# Patient Record
Sex: Male | Born: 1950
Health system: Southern US, Community
[De-identification: ages and names within clinical notes are randomized; demographics above are authoritative.]

## PROBLEM LIST (undated history)

## (undated) DIAGNOSIS — R079 Chest pain, unspecified: Secondary | ICD-10-CM

## (undated) DIAGNOSIS — I480 Paroxysmal atrial fibrillation: Secondary | ICD-10-CM

## (undated) DIAGNOSIS — E7800 Pure hypercholesterolemia, unspecified: Secondary | ICD-10-CM

## (undated) DIAGNOSIS — R002 Palpitations: Secondary | ICD-10-CM

## (undated) DIAGNOSIS — J45909 Unspecified asthma, uncomplicated: Secondary | ICD-10-CM

## (undated) DIAGNOSIS — N200 Calculus of kidney: Secondary | ICD-10-CM

## (undated) DIAGNOSIS — E0590 Thyrotoxicosis, unspecified without thyrotoxic crisis or storm: Secondary | ICD-10-CM

## (undated) DIAGNOSIS — I471 Supraventricular tachycardia, unspecified: Secondary | ICD-10-CM

## (undated) DIAGNOSIS — I10 Essential (primary) hypertension: Secondary | ICD-10-CM

## (undated) DIAGNOSIS — I2510 Atherosclerotic heart disease of native coronary artery without angina pectoris: Secondary | ICD-10-CM

## (undated) DIAGNOSIS — Z955 Presence of coronary angioplasty implant and graft: Secondary | ICD-10-CM

## (undated) DIAGNOSIS — K219 Gastro-esophageal reflux disease without esophagitis: Secondary | ICD-10-CM

## (undated) DIAGNOSIS — D649 Anemia, unspecified: Secondary | ICD-10-CM

## (undated) DIAGNOSIS — K859 Acute pancreatitis without necrosis or infection, unspecified: Secondary | ICD-10-CM

## (undated) DIAGNOSIS — C439 Malignant melanoma of skin, unspecified: Secondary | ICD-10-CM

## (undated) DIAGNOSIS — U071 COVID-19: Secondary | ICD-10-CM

## (undated) DIAGNOSIS — K5792 Diverticulitis of intestine, part unspecified, without perforation or abscess without bleeding: Secondary | ICD-10-CM

## (undated) DIAGNOSIS — E119 Type 2 diabetes mellitus without complications: Secondary | ICD-10-CM

## (undated) DIAGNOSIS — M1990 Unspecified osteoarthritis, unspecified site: Secondary | ICD-10-CM

## (undated) DIAGNOSIS — M47816 Spondylosis without myelopathy or radiculopathy, lumbar region: Secondary | ICD-10-CM

## (undated) DIAGNOSIS — Z452 Encounter for adjustment and management of vascular access device: Secondary | ICD-10-CM

## (undated) DIAGNOSIS — Z9981 Dependence on supplemental oxygen: Secondary | ICD-10-CM

## (undated) DIAGNOSIS — J449 Chronic obstructive pulmonary disease, unspecified: Secondary | ICD-10-CM

## (undated) DIAGNOSIS — G4733 Obstructive sleep apnea (adult) (pediatric): Secondary | ICD-10-CM

## (undated) DIAGNOSIS — J189 Pneumonia, unspecified organism: Secondary | ICD-10-CM

## (undated) DIAGNOSIS — Z87442 Personal history of urinary calculi: Secondary | ICD-10-CM

## (undated) DIAGNOSIS — E785 Hyperlipidemia, unspecified: Secondary | ICD-10-CM

## (undated) DIAGNOSIS — K8020 Calculus of gallbladder without cholecystitis without obstruction: Secondary | ICD-10-CM

## (undated) HISTORY — DX: Atherosclerotic heart disease of native coronary artery without angina pectoris: I25.10

## (undated) HISTORY — DX: Malignant melanoma of skin, unspecified: C43.9

## (undated) HISTORY — DX: Supraventricular tachycardia: I47.1

## (undated) HISTORY — DX: Calculus of gallbladder without cholecystitis without obstruction: K80.20

## (undated) HISTORY — DX: Gastro-esophageal reflux disease without esophagitis: K21.9

## (undated) HISTORY — DX: Paroxysmal atrial fibrillation: I48.0

## (undated) HISTORY — PX: TIBIA HARDWARE REMOVAL: SUR1133

## (undated) HISTORY — PX: FRACTURE SURGERY: SHX138

## (undated) HISTORY — DX: Obstructive sleep apnea (adult) (pediatric): G47.33

## (undated) HISTORY — PX: LUMBAR DISC SURGERY: SHX700

## (undated) HISTORY — DX: Calculus of kidney: N20.0

## (undated) HISTORY — PX: PROSTATE SURGERY: SHX751

## (undated) HISTORY — DX: Presence of coronary angioplasty implant and graft: Z95.5

## (undated) HISTORY — PX: CHOLECYSTECTOMY: SHX55

## (undated) HISTORY — PX: CYSTOSCOPY W/ STONE MANIPULATION: SHX1427

## (undated) HISTORY — PX: OTHER SURGICAL HISTORY: SHX169

## (undated) HISTORY — DX: COVID-19: U07.1

## (undated) HISTORY — DX: Pure hypercholesterolemia, unspecified: E78.00

## (undated) HISTORY — DX: Essential (primary) hypertension: I10

## (undated) HISTORY — DX: Hyperlipidemia, unspecified: E78.5

## (undated) HISTORY — DX: Supraventricular tachycardia, unspecified: I47.10

---
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
## 1988-01-26 HISTORY — PX: TIBIA FRACTURE SURGERY: SHX806

## 1997-10-14 ENCOUNTER — Inpatient Hospital Stay (HOSPITAL_COMMUNITY): Admission: EM | Admit: 1997-10-14 | Discharge: 1997-10-16 | Payer: Self-pay | Admitting: Cardiology

## 1998-10-09 ENCOUNTER — Ambulatory Visit (HOSPITAL_COMMUNITY): Admission: RE | Admit: 1998-10-09 | Discharge: 1998-10-09 | Payer: Self-pay | Admitting: Family Medicine

## 1998-10-09 ENCOUNTER — Encounter: Payer: Self-pay | Admitting: Family Medicine

## 1998-11-25 ENCOUNTER — Encounter (INDEPENDENT_AMBULATORY_CARE_PROVIDER_SITE_OTHER): Payer: Self-pay

## 1998-11-25 ENCOUNTER — Encounter: Payer: Self-pay | Admitting: Specialist

## 1998-11-25 ENCOUNTER — Observation Stay (HOSPITAL_COMMUNITY): Admission: RE | Admit: 1998-11-25 | Discharge: 1998-11-26 | Payer: Self-pay | Admitting: Specialist

## 2000-04-15 ENCOUNTER — Encounter (INDEPENDENT_AMBULATORY_CARE_PROVIDER_SITE_OTHER): Payer: Self-pay | Admitting: Specialist

## 2000-04-15 ENCOUNTER — Observation Stay (HOSPITAL_COMMUNITY): Admission: RE | Admit: 2000-04-15 | Discharge: 2000-04-16 | Payer: Self-pay | Admitting: Specialist

## 2000-04-15 ENCOUNTER — Encounter: Payer: Self-pay | Admitting: Specialist

## 2002-01-25 HISTORY — PX: POSTERIOR LUMBAR FUSION: SHX6036

## 2002-09-27 ENCOUNTER — Encounter: Payer: Self-pay | Admitting: Specialist

## 2002-10-03 ENCOUNTER — Inpatient Hospital Stay (HOSPITAL_COMMUNITY): Admission: RE | Admit: 2002-10-03 | Discharge: 2002-10-08 | Payer: Self-pay | Admitting: Specialist

## 2002-10-03 ENCOUNTER — Encounter: Payer: Self-pay | Admitting: Specialist

## 2003-05-17 ENCOUNTER — Encounter: Admission: RE | Admit: 2003-05-17 | Discharge: 2003-05-17 | Payer: Self-pay | Admitting: Specialist

## 2004-08-30 HISTORY — PX: CORONARY ANGIOPLASTY WITH STENT PLACEMENT: SHX49

## 2004-08-31 ENCOUNTER — Ambulatory Visit: Payer: Self-pay | Admitting: Cardiology

## 2004-08-31 ENCOUNTER — Inpatient Hospital Stay (HOSPITAL_COMMUNITY): Admission: AD | Admit: 2004-08-31 | Discharge: 2004-09-01 | Payer: Self-pay | Admitting: Cardiology

## 2005-01-21 ENCOUNTER — Inpatient Hospital Stay (HOSPITAL_COMMUNITY): Admission: EM | Admit: 2005-01-21 | Discharge: 2005-01-23 | Payer: Self-pay | Admitting: Emergency Medicine

## 2005-01-21 ENCOUNTER — Ambulatory Visit: Payer: Self-pay | Admitting: Internal Medicine

## 2005-01-25 HISTORY — PX: ATRIAL ABLATION SURGERY: SHX560

## 2005-01-27 ENCOUNTER — Ambulatory Visit (HOSPITAL_COMMUNITY): Admission: RE | Admit: 2005-01-27 | Discharge: 2005-01-28 | Payer: Self-pay | Admitting: Internal Medicine

## 2005-01-27 ENCOUNTER — Ambulatory Visit: Payer: Self-pay | Admitting: Internal Medicine

## 2005-03-10 ENCOUNTER — Ambulatory Visit: Payer: Self-pay | Admitting: Internal Medicine

## 2005-05-26 ENCOUNTER — Encounter: Payer: Self-pay | Admitting: Cardiology

## 2006-01-25 HISTORY — PX: OTHER SURGICAL HISTORY: SHX169

## 2006-10-09 ENCOUNTER — Encounter: Payer: Self-pay | Admitting: Internal Medicine

## 2006-11-24 ENCOUNTER — Encounter: Payer: Self-pay | Admitting: Pulmonary Disease

## 2006-12-17 ENCOUNTER — Encounter: Payer: Self-pay | Admitting: Pulmonary Disease

## 2007-01-09 ENCOUNTER — Encounter: Payer: Self-pay | Admitting: Pulmonary Disease

## 2007-12-25 ENCOUNTER — Encounter: Payer: Self-pay | Admitting: Physician Assistant

## 2007-12-25 HISTORY — PX: COLONOSCOPY: SHX174

## 2008-08-26 ENCOUNTER — Encounter: Payer: Self-pay | Admitting: Pulmonary Disease

## 2008-09-13 ENCOUNTER — Encounter: Payer: Self-pay | Admitting: Pulmonary Disease

## 2008-09-23 ENCOUNTER — Ambulatory Visit: Payer: Self-pay | Admitting: Cardiovascular Disease

## 2008-09-25 ENCOUNTER — Telehealth (INDEPENDENT_AMBULATORY_CARE_PROVIDER_SITE_OTHER): Payer: Self-pay | Admitting: *Deleted

## 2008-09-25 ENCOUNTER — Telehealth: Payer: Self-pay | Admitting: Cardiovascular Disease

## 2008-09-26 ENCOUNTER — Ambulatory Visit: Payer: Self-pay | Admitting: Cardiovascular Disease

## 2008-09-26 ENCOUNTER — Telehealth: Payer: Self-pay | Admitting: Cardiovascular Disease

## 2008-10-10 ENCOUNTER — Telehealth: Payer: Self-pay | Admitting: Cardiovascular Disease

## 2008-10-15 ENCOUNTER — Ambulatory Visit: Payer: Self-pay | Admitting: Internal Medicine

## 2008-10-15 DIAGNOSIS — R002 Palpitations: Secondary | ICD-10-CM

## 2008-10-15 DIAGNOSIS — E781 Pure hyperglyceridemia: Secondary | ICD-10-CM | POA: Insufficient documentation

## 2008-10-15 DIAGNOSIS — E785 Hyperlipidemia, unspecified: Secondary | ICD-10-CM

## 2008-10-15 DIAGNOSIS — I200 Unstable angina: Secondary | ICD-10-CM

## 2008-10-15 DIAGNOSIS — K219 Gastro-esophageal reflux disease without esophagitis: Secondary | ICD-10-CM

## 2008-10-15 DIAGNOSIS — N200 Calculus of kidney: Secondary | ICD-10-CM

## 2008-10-15 DIAGNOSIS — I10 Essential (primary) hypertension: Secondary | ICD-10-CM | POA: Insufficient documentation

## 2008-10-15 HISTORY — DX: Hyperlipidemia, unspecified: E78.5

## 2008-10-15 HISTORY — DX: Essential (primary) hypertension: I10

## 2008-10-15 HISTORY — DX: Calculus of kidney: N20.0

## 2008-10-15 HISTORY — DX: Pure hyperglyceridemia: E78.1

## 2008-10-15 HISTORY — DX: Unstable angina: I20.0

## 2008-10-15 HISTORY — DX: Gastro-esophageal reflux disease without esophagitis: K21.9

## 2008-10-15 LAB — CONVERTED CEMR LAB
ALT: 36 units/L (ref 0–53)
Albumin: 3.7 g/dL (ref 3.5–5.2)
TSH: 0.63 microintl units/mL (ref 0.35–5.50)
Total Protein: 6.7 g/dL (ref 6.0–8.3)

## 2008-10-16 ENCOUNTER — Telehealth (INDEPENDENT_AMBULATORY_CARE_PROVIDER_SITE_OTHER): Payer: Self-pay | Admitting: *Deleted

## 2008-11-21 ENCOUNTER — Ambulatory Visit: Payer: Self-pay | Admitting: Internal Medicine

## 2008-11-21 DIAGNOSIS — I4891 Unspecified atrial fibrillation: Secondary | ICD-10-CM

## 2008-11-21 DIAGNOSIS — R079 Chest pain, unspecified: Secondary | ICD-10-CM

## 2008-11-21 DIAGNOSIS — I4821 Permanent atrial fibrillation: Secondary | ICD-10-CM | POA: Insufficient documentation

## 2008-11-21 DIAGNOSIS — I4819 Other persistent atrial fibrillation: Secondary | ICD-10-CM | POA: Insufficient documentation

## 2008-11-21 DIAGNOSIS — I480 Paroxysmal atrial fibrillation: Secondary | ICD-10-CM

## 2008-11-21 HISTORY — DX: Paroxysmal atrial fibrillation: I48.0

## 2008-11-21 HISTORY — DX: Unspecified atrial fibrillation: I48.91

## 2008-11-21 HISTORY — DX: Chest pain, unspecified: R07.9

## 2008-12-09 ENCOUNTER — Ambulatory Visit: Payer: Self-pay

## 2008-12-09 ENCOUNTER — Ambulatory Visit: Payer: Self-pay | Admitting: Internal Medicine

## 2008-12-09 ENCOUNTER — Encounter: Payer: Self-pay | Admitting: Internal Medicine

## 2008-12-09 ENCOUNTER — Ambulatory Visit (HOSPITAL_COMMUNITY): Admission: RE | Admit: 2008-12-09 | Discharge: 2008-12-09 | Payer: Self-pay | Admitting: Internal Medicine

## 2008-12-25 ENCOUNTER — Encounter: Payer: Self-pay | Admitting: Internal Medicine

## 2009-01-20 ENCOUNTER — Encounter: Payer: Self-pay | Admitting: Internal Medicine

## 2009-01-27 ENCOUNTER — Encounter: Payer: Self-pay | Admitting: Internal Medicine

## 2009-02-06 ENCOUNTER — Encounter: Payer: Self-pay | Admitting: Internal Medicine

## 2009-02-11 ENCOUNTER — Encounter: Payer: Self-pay | Admitting: Internal Medicine

## 2009-02-12 ENCOUNTER — Encounter: Payer: Self-pay | Admitting: Internal Medicine

## 2009-02-18 ENCOUNTER — Encounter: Payer: Self-pay | Admitting: Internal Medicine

## 2009-02-19 ENCOUNTER — Ambulatory Visit: Payer: Self-pay | Admitting: Internal Medicine

## 2009-02-19 LAB — CONVERTED CEMR LAB
BUN: 10 mg/dL (ref 6–23)
Basophils Absolute: 0 10*3/uL (ref 0.0–0.1)
CO2: 27 meq/L (ref 19–32)
Chloride: 107 meq/L (ref 96–112)
Creatinine, Ser: 1.1 mg/dL (ref 0.4–1.5)
GFR calc non Af Amer: 73.18 mL/min (ref >60)
Glucose, Bld: 120 mg/dL — ABNORMAL HIGH (ref 70–99)
Lymphocytes Relative: 22.2 % (ref 12.0–46.0)
MCHC: 32.6 g/dL (ref 30.0–36.0)
Monocytes Relative: 4.5 % (ref 3.0–12.0)
Neutro Abs: 3.6 10*3/uL (ref 1.4–7.7)
Neutrophils Relative %: 69.9 % (ref 43.0–77.0)
Platelets: 130 10*3/uL — ABNORMAL LOW (ref 150.0–400.0)
Potassium: 3.9 meq/L (ref 3.5–5.1)
RBC: 5.27 M/uL (ref 4.22–5.81)
RDW: 14.4 % (ref 11.5–14.6)
Sodium: 140 meq/L (ref 135–145)
aPTT: 40.1 s — ABNORMAL HIGH (ref 21.7–28.8)

## 2009-02-24 ENCOUNTER — Encounter: Payer: Self-pay | Admitting: Internal Medicine

## 2009-02-24 ENCOUNTER — Ambulatory Visit: Payer: Self-pay | Admitting: Internal Medicine

## 2009-02-24 ENCOUNTER — Ambulatory Visit (HOSPITAL_COMMUNITY): Admission: RE | Admit: 2009-02-24 | Discharge: 2009-02-24 | Payer: Self-pay | Admitting: Internal Medicine

## 2009-02-25 ENCOUNTER — Inpatient Hospital Stay (HOSPITAL_COMMUNITY)
Admission: RE | Admit: 2009-02-25 | Discharge: 2009-02-26 | Payer: Self-pay | Source: Home / Self Care | Admitting: Internal Medicine

## 2009-02-25 ENCOUNTER — Ambulatory Visit: Payer: Self-pay | Admitting: Internal Medicine

## 2009-02-26 ENCOUNTER — Encounter: Payer: Self-pay | Admitting: Internal Medicine

## 2009-03-04 ENCOUNTER — Encounter: Payer: Self-pay | Admitting: Internal Medicine

## 2009-03-06 ENCOUNTER — Telehealth: Payer: Self-pay | Admitting: Internal Medicine

## 2009-03-11 ENCOUNTER — Encounter: Payer: Self-pay | Admitting: Internal Medicine

## 2009-03-19 ENCOUNTER — Ambulatory Visit: Payer: Self-pay | Admitting: Internal Medicine

## 2009-03-19 DIAGNOSIS — R0602 Shortness of breath: Secondary | ICD-10-CM

## 2009-03-19 HISTORY — DX: Shortness of breath: R06.02

## 2009-03-27 ENCOUNTER — Telehealth: Payer: Self-pay | Admitting: Internal Medicine

## 2009-04-25 ENCOUNTER — Telehealth: Payer: Self-pay | Admitting: Internal Medicine

## 2009-05-15 ENCOUNTER — Ambulatory Visit: Payer: Self-pay | Admitting: Pulmonary Disease

## 2009-05-15 DIAGNOSIS — J449 Chronic obstructive pulmonary disease, unspecified: Secondary | ICD-10-CM

## 2009-05-15 HISTORY — DX: Chronic obstructive pulmonary disease, unspecified: J44.9

## 2009-05-17 DIAGNOSIS — G4730 Sleep apnea, unspecified: Secondary | ICD-10-CM | POA: Insufficient documentation

## 2009-05-17 HISTORY — DX: Sleep apnea, unspecified: G47.30

## 2009-06-16 ENCOUNTER — Ambulatory Visit: Payer: Self-pay | Admitting: Internal Medicine

## 2009-06-16 LAB — CONVERTED CEMR LAB
CO2: 28 meq/L (ref 19–32)
Chloride: 105 meq/L (ref 96–112)
GFR calc non Af Amer: 65.49 mL/min (ref >60)
Potassium: 4.2 meq/L (ref 3.5–5.1)
Sodium: 142 meq/L (ref 135–145)

## 2009-06-24 ENCOUNTER — Telehealth: Payer: Self-pay | Admitting: Internal Medicine

## 2009-06-25 ENCOUNTER — Inpatient Hospital Stay (HOSPITAL_COMMUNITY): Admission: EM | Admit: 2009-06-25 | Discharge: 2009-06-28 | Payer: Self-pay | Admitting: Internal Medicine

## 2009-06-25 ENCOUNTER — Ambulatory Visit: Payer: Self-pay | Admitting: Internal Medicine

## 2009-06-25 ENCOUNTER — Encounter: Payer: Self-pay | Admitting: Internal Medicine

## 2009-06-30 ENCOUNTER — Telehealth: Payer: Self-pay | Admitting: Internal Medicine

## 2009-07-02 ENCOUNTER — Telehealth: Payer: Self-pay | Admitting: Internal Medicine

## 2009-07-03 ENCOUNTER — Encounter: Payer: Self-pay | Admitting: Pulmonary Disease

## 2009-07-14 ENCOUNTER — Ambulatory Visit: Payer: Self-pay | Admitting: Pulmonary Disease

## 2009-07-21 ENCOUNTER — Telehealth (INDEPENDENT_AMBULATORY_CARE_PROVIDER_SITE_OTHER): Payer: Self-pay | Admitting: *Deleted

## 2009-08-07 ENCOUNTER — Ambulatory Visit: Payer: Self-pay | Admitting: Internal Medicine

## 2009-08-25 ENCOUNTER — Encounter: Payer: Self-pay | Admitting: Internal Medicine

## 2009-08-25 ENCOUNTER — Telehealth: Payer: Self-pay | Admitting: Internal Medicine

## 2009-08-29 ENCOUNTER — Telehealth: Payer: Self-pay | Admitting: Internal Medicine

## 2009-09-08 ENCOUNTER — Encounter: Payer: Self-pay | Admitting: Internal Medicine

## 2009-09-15 ENCOUNTER — Encounter: Payer: Self-pay | Admitting: Internal Medicine

## 2009-09-16 ENCOUNTER — Encounter: Payer: Self-pay | Admitting: Internal Medicine

## 2009-09-17 ENCOUNTER — Ambulatory Visit: Payer: Self-pay | Admitting: Cardiology

## 2009-09-17 ENCOUNTER — Ambulatory Visit (HOSPITAL_COMMUNITY): Admission: RE | Admit: 2009-09-17 | Discharge: 2009-09-17 | Payer: Self-pay | Admitting: Internal Medicine

## 2009-09-17 ENCOUNTER — Ambulatory Visit: Payer: Self-pay | Admitting: Internal Medicine

## 2009-09-18 LAB — CONVERTED CEMR LAB
BUN: 15 mg/dL (ref 6–23)
Basophils Absolute: 0 10*3/uL (ref 0.0–0.1)
CO2: 28 meq/L (ref 19–32)
Calcium: 9.4 mg/dL (ref 8.4–10.5)
Chloride: 103 meq/L (ref 96–112)
Eosinophils Absolute: 0.1 10*3/uL (ref 0.0–0.7)
Eosinophils Relative: 2.7 % (ref 0.0–5.0)
Glucose, Bld: 89 mg/dL (ref 70–99)
HCT: 40.1 % (ref 39.0–52.0)
Lymphocytes Relative: 22.4 % (ref 12.0–46.0)
MCV: 83 fL (ref 78.0–100.0)
Neutrophils Relative %: 68.3 % (ref 43.0–77.0)
Platelets: 147 10*3/uL — ABNORMAL LOW (ref 150.0–400.0)
Prothrombin Time: 20.8 s — ABNORMAL HIGH (ref 9.7–11.8)
RBC: 4.83 M/uL (ref 4.22–5.81)
RDW: 15.3 % — ABNORMAL HIGH (ref 11.5–14.6)
aPTT: 35.7 s — ABNORMAL HIGH (ref 21.7–28.8)

## 2009-09-22 ENCOUNTER — Encounter: Payer: Self-pay | Admitting: Internal Medicine

## 2009-09-24 ENCOUNTER — Ambulatory Visit: Payer: Self-pay | Admitting: Cardiovascular Disease

## 2009-09-24 ENCOUNTER — Ambulatory Visit (HOSPITAL_COMMUNITY): Admission: RE | Admit: 2009-09-24 | Discharge: 2009-09-24 | Payer: Self-pay | Admitting: Cardiovascular Disease

## 2009-09-24 ENCOUNTER — Encounter: Payer: Self-pay | Admitting: Internal Medicine

## 2009-09-25 ENCOUNTER — Ambulatory Visit: Payer: Self-pay | Admitting: Internal Medicine

## 2009-09-25 ENCOUNTER — Ambulatory Visit (HOSPITAL_COMMUNITY): Admission: RE | Admit: 2009-09-25 | Discharge: 2009-09-26 | Payer: Self-pay | Admitting: Internal Medicine

## 2009-09-30 ENCOUNTER — Ambulatory Visit: Payer: Self-pay | Admitting: Cardiovascular Disease

## 2009-09-30 ENCOUNTER — Encounter: Payer: Self-pay | Admitting: Internal Medicine

## 2009-09-30 ENCOUNTER — Ambulatory Visit: Payer: Self-pay

## 2009-10-01 ENCOUNTER — Encounter: Payer: Self-pay | Admitting: Internal Medicine

## 2009-10-06 ENCOUNTER — Telehealth: Payer: Self-pay | Admitting: Internal Medicine

## 2009-11-13 ENCOUNTER — Encounter: Payer: Self-pay | Admitting: Pulmonary Disease

## 2009-11-26 ENCOUNTER — Encounter: Payer: Self-pay | Admitting: Pulmonary Disease

## 2009-11-27 ENCOUNTER — Encounter: Payer: Self-pay | Admitting: Pulmonary Disease

## 2009-11-27 ENCOUNTER — Telehealth (INDEPENDENT_AMBULATORY_CARE_PROVIDER_SITE_OTHER): Payer: Self-pay | Admitting: *Deleted

## 2009-12-31 ENCOUNTER — Ambulatory Visit: Payer: Self-pay | Admitting: Internal Medicine

## 2009-12-31 ENCOUNTER — Encounter: Payer: Self-pay | Admitting: Internal Medicine

## 2009-12-31 DIAGNOSIS — I2510 Atherosclerotic heart disease of native coronary artery without angina pectoris: Secondary | ICD-10-CM | POA: Insufficient documentation

## 2010-01-05 ENCOUNTER — Telehealth: Payer: Self-pay | Admitting: Internal Medicine

## 2010-01-07 ENCOUNTER — Telehealth: Payer: Self-pay | Admitting: Internal Medicine

## 2010-02-15 ENCOUNTER — Encounter: Payer: Self-pay | Admitting: Internal Medicine

## 2010-02-26 ENCOUNTER — Encounter: Payer: Self-pay | Admitting: Internal Medicine

## 2010-02-26 NOTE — Miscellaneous (Signed)
Summary: Living Will  Living Will   Imported By: Sallee Provencal 02/04/2009 11:44:13  _____________________________________________________________________  External Attachment:    Type:   Image     Comment:   External Document

## 2010-02-26 NOTE — Miscellaneous (Signed)
Summary: [place]   Imported By: Marilynne Drivers 03/25/2009 12:53:11  _____________________________________________________________________  External Attachment:    Type:   Image     Comment:   External Document

## 2010-02-26 NOTE — Letter (Signed)
Summary: ELectrophysiology/Ablation Procedure Instructions  [name] [place]  [address] [place]   [place], [place] [address]   Phone: [phone]  Fax: [phone]     Electrophysiology/Ablation Procedure Instructions    You are scheduled for a(n) a-fib ablation on 09/25/09 at 7:30am with Dr. Rayann Heman.  1.  Please come to the [place] at [facility] at 5:30am on the day of your procedure.  2.  Come prepared to stay overnight.   Please bring your insurance cards and a list of your medications.  3.  Come go to Dr. Willette Pa office on 09/18/09 for lab work pre ablation.    You do not have to be fasting.(pt has order)fax results to [phone]  4.  Do not have anything to eat or drink after midnight the night before your procedure.  5.  Do NOT take these medications for the pm before and the am of  procedure unless otherwise instructed:  Metoprolol.  All of your remaining medications may be taken with a small amount of water.     * Occasionally, EP studies and ablations can become lengthy.  Please make your family aware of this before your procedure starts.  Average time ranges from 2-8 hours for EP studies/ablations.  Your physician will locate your family after the procedure with the results.  * If you have any questions after you get home, please call the office at [phone]. [name]  TEE--09/24/09 at              .  please check in at Short Stay at [facility] at          .  Nothing to eat or drink after midnight the night before.    Cardiac CT the week of 8/15 or 8/22( will need labs- a BMP one week prior to CT)  Weekly INR's starting week of 08/25/09-fax results to [phone]

## 2010-02-26 NOTE — Progress Notes (Signed)
Summary: PT REQ CALL BACK TO PHARMACY  Phone Note Call from Patient Call back at Home Phone [phone]   Caller: Patient Reason for Call: Talk to Nurse Summary of Call: [place] 60-PT [place] Initial call taken by: Lorenda Hatchet,  July 02, 2009 9:25 AM  Follow-up for Phone Call        spoke with Truman Hayward at [facility] she can't see that anything was done on 06/30/09 with his Rx and giving the quanity of 60 not 30 as it was written by Hershal Coria.  He only was charged a 15 day co payment but the 15 day and 30 day are the same  He should not be charged and says he spoke with pharmacist and siad as long as we called and corrected he would not be.  left message for pt regarding above. Janan Halter, RN, BSN  July 02, 2009 9:59 AM

## 2010-02-26 NOTE — Miscellaneous (Signed)
  Clinical Lists Changes  Observations: Added new observation of ECHOINTERP:  Left ventricle: The cavity size was normal. Wall thickness was     normal. Systolic function was normal. The estimated ejection     fraction was in the range of 55% to 60%. Doppler parameters are     consistent with abnormal left ventricular relaxation (grade 1     diastolic dysfunction).        (12/09/2008 9:54)      Echocardiogram  Procedure date:  12/09/2008  Findings:       Left ventricle: The cavity size was normal. Wall thickness was     normal. Systolic function was normal. The estimated ejection     fraction was in the range of 55% to 60%. Doppler parameters are     consistent with abnormal left ventricular relaxation (grade 1     diastolic dysfunction).

## 2010-02-26 NOTE — Progress Notes (Signed)
Summary: knot at incision site  Phone Note Call from Patient   Caller: Patient Reason for Call: Talk to Nurse Summary of Call: pt has golf ball sized knot at incision site -pls [phone] Initial call taken by: Lorenda Hatchet,  October 06, 2009 1:44 PM  Follow-up for Phone Call        spoke with Dr Rayann Heman  He states we have done CT and [place] of his grion which were both negative.  Spoke with pt he is going to use warm compresses several times daily and call me back towardsthe end of week.  He is feeling musch better just has the knot in groin.  It does not hurt him  he just wanted [place] to know it was there.  I let him know this would go down over time. Janan Halter, RN, BSN  October 06, 2009 3:29 PM Called back today to see if his groin was any better.  LMOM for him to call me back if things are not improving. Janan Halter, RN, BSN  October 08, 2009 3:22 PM

## 2010-02-26 NOTE — Assessment & Plan Note (Signed)
Summary: eph/post ablation per amber/lg   Visit Type:  EPH Referring Provider:  Winn Jock Primary Provider:  [name], MD  CC:   .  History of Present Illness: The patient presents today for routine electrophysiology followup. He reports doing very well since his most recent afib ablation.   He is pleased with his result.  He denies procedure related complications and has had no further symptoms of afib.  The patient denies symptoms of palpitations, chest pain, shortness of breath, orthopnea, PND, lower extremity edema, dizziness, presyncope, syncope, or neurologic sequela. The patient is tolerating medications without difficulties and is otherwise without complaint today.   Current Medications (verified): 1)  Clonidine Hcl 0.2 Mg Tabs (Clonidine Hcl) .... Take One Tablet By Mouth Two Times A Day 2)  Protonix 40 Mg Tbec (Pantoprazole Sodium) .Marland Kitchen.. 1 Tab Once Daily Sometimes Pt Takes 2 Tabs 3)  Lisinopril 40 Mg Tabs (Lisinopril) .... Take One Tablet By Mouth Daily 4)  Lipitor 40 Mg Tabs (Atorvastatin Calcium) .... Take One Tablet By Mouth Daily. 5)  Fenofibrate 160 Mg Tabs (Fenofibrate) .... Take One Tablet By Mouth Daily With A Meal 6)  Tamsulosin Hcl 0.4 Mg Caps (Tamsulosin Hcl) .Marland Kitchen.. 1 By Mouth Once Daily 7)  Warfarin Sodium 7.5 Mg Tabs (Warfarin Sodium) .... Use As Directed By Anticoagulation Clinic 8)  Stool Softener 100 Mg Caps (Docusate Sodium) .Marland Kitchen.. 1 By Mouth As Needed 9)  Hydrocodone-Acetaminophen 5-500 Mg Tabs (Hydrocodone-Acetaminophen) .Marland Kitchen.. 1 or 2 Every 6 As Needed 10)  Combivent 103-18 Mcg/act Aero (Ipratropium-Albuterol) .... As Needed 11)  Nitrostat 0.4 Mg Subl (Nitroglycerin) .Marland Kitchen.. 1 Tablet Under Tongue At Onset of Chest Pain; You May Repeat Every 5 Minutes For Up To 3 Doses. 12)  Aspirin 81 Mg Tbec (Aspirin) .... Take One Tablet By Mouth Daily 13)  Cpap 12 Cm .... Oval Lin[facility] [phone] 14)  Amiodarone Hcl 200 Mg Tabs (Amiodarone Hcl) .... Take 1 Tablet  By Mouth Once A Day 15)  Metoprolol Tartrate 50 Mg Tabs (Metoprolol Tartrate) .... Take 1 Tablet By Mouth Two Times A Day 16)  Amlodipine Besylate 5 Mg Tabs (Amlodipine Besylate) .... Take One Tablet By Mouth Daily  Allergies: 1)  ! Penicillin  Past History:  Past Medical History: Paroxysmal afib  s/p PVI 2/11 and 9/11 Atrial flutter s/p atypical AVNRT ablation of the slow pathway GERD (ICD-530.81) NEPHROLITHIASIS (ICD-592.0) DYSLIPIDEMIA (ICD-272.4) HYPERTENSION (ICD-401.9) CAD s/p PCI mid LAD (Vision stent)  08/31/2004,  repeat cath 6/11- no progression of CAD OSA noncompliant with CPAP  Past Surgical History:  History of cholecystectomy.  History of tonsillectomy.  Degenerative joint disease status post multiple diskectomies of the lumbar spine  s/p slow pathway ablation for atypical AVNRT 2007  negative EP study in [facility] 2008  PCI mid LAD (vision) 2006 s/p PVI/ CTI ablation 02/25/09 and 9/11  Social History: Reviewed history from 05/15/2009 and no changes required. Resides in [place] with his wife. He has two children and  two grandchildren, He attends [employer].  Prior to his  retirement, he was a [employer] Administrator. He has not smoked since March 2006;  prior to this, he smoked three packs per day for 38  years. He denies any  alcohol, drugs, herbal medications.  Review of Systems       All systems are reviewed and negative except as listed in the HPI.   Vital Signs:  Patient profile:   60 year old male Height:  72 inches Weight:      291.75 pounds BMI:     39.71 Pulse rate:   66 / minute Pulse rhythm:   irregular BP sitting:   122 / 70  (right arm) Cuff size:   large  Vitals Entered By: Julaine Hua, CMA (December 31, 2009 9:31 AM)  Physical Exam  General:  overweight Head:  normocephalic and atraumatic Eyes:  PERRLA/EOM intact; conjunctiva and lids normal. Mouth:  Teeth, gums and palate normal. Oral mucosa normal. Neck:  Neck  supple, no JVD. No masses, thyromegaly or abnormal cervical nodes. Lungs:  Clear bilaterally to auscultation and percussion. Heart:  RRR, no m/r/g Abdomen:  Bowel sounds positive; abdomen soft and non-tender without masses, organomegaly, or hernias noted. No hepatosplenomegaly. Msk:  Back normal, normal gait. Muscle strength and tone normal. Extremities:  No clubbing or cyanosis. Neurologic:  Alert and oriented x 3.   EKG  Procedure date:  12/31/2009  Findings:      sinus rhythm 66 bpm, PR 166, Qtc 454, LAD otherwise normal ekg  Impression & Recommendations:  Problem # 1:  ATRIAL FIBRILLATION (ICD-427.31)  maintaining sinus rhythm s/p repeat afib ablation 9/11 we will continue coumadin longterm decrease amiodarone to 164m daily today  The following medications were removed from the medication list:    Aspirin 81 Mg Tbec (Aspirin) ..Marland Kitchen.. Take one tablet by mouth daily His updated medication list for this problem includes:    Warfarin Sodium 7.5 Mg Tabs (Warfarin sodium) ..... Use as directed by anticoagulation clinic    Amiodarone Hcl 200 Mg Tabs (Amiodarone hcl) ..Marland Kitchen.. Take 1/2  tablet by mouth once a day    Metoprolol Tartrate 50 Mg Tabs (Metoprolol tartrate) ..Marland Kitchen.. Take 1 tablet by mouth two times a day  Problem # 2:  OBSTRUCTIVE SLEEP APNEA (ICD-780.57) CPAP complaince and weight loss advised  Problem # 3:  SHORTNESS OF BREATH (ICD-786.05)  resolved in sinus rhythm  The following medications were removed from the medication list:    Aspirin 81 Mg Tbec (Aspirin) ..Marland Kitchen.. Take one tablet by mouth daily His updated medication list for this problem includes:    Lisinopril 40 Mg Tabs (Lisinopril) ..Marland Kitchen.. Take one tablet by mouth daily    Metoprolol Tartrate 50 Mg Tabs (Metoprolol tartrate) ..Marland Kitchen.. Take 1 tablet by mouth two times a day    Amlodipine Besylate 5 Mg Tabs (Amlodipine besylate) ..Marland Kitchen.. Take one tablet by mouth daily  Problem # 4:  HYPERTENSION (ICD-401.9)  at  goal  The following medications were removed from the medication list:    Aspirin 81 Mg Tbec (Aspirin) ..Marland Kitchen.. Take one tablet by mouth daily His updated medication list for this problem includes:    Clonidine Hcl 0.2 Mg Tabs (Clonidine hcl) ..Marland Kitchen.. Take one tablet by mouth two times a day    Lisinopril 40 Mg Tabs (Lisinopril) ..Marland Kitchen.. Take one tablet by mouth daily    Metoprolol Tartrate 50 Mg Tabs (Metoprolol tartrate) ..Marland Kitchen.. Take 1 tablet by mouth two times a day    Amlodipine Besylate 5 Mg Tabs (Amlodipine besylate) ..Marland Kitchen.. Take one tablet by mouth daily  Problem # 5:  CAD (ICD-414.00) no sypmtoms of ischemia given coumadin therapy and increased risks for bleeding, we will stop ASA today  Patient Instructions: 1)  Your physician has recommended you make the following change in your medication: Stop Aspirin, Decrease Amiodarone to 1086mdaily.  2)  Your physician wants you to follow-up in: 6 months  You will receive a reminder letter  in the mail two months in advance. If you don't receive a letter, please call our office to schedule the follow-up appointment. 3)  Your physician encouraged you to lose weight for better health.

## 2010-02-26 NOTE — Progress Notes (Signed)
Summary: physicians order status  Phone Note Other Incoming Call back at [phone]   Caller: [email or web address] Summary of Call: Following up on physician's order that was to be faxed yesterday. The fax number is  [phone]. Initial call taken by: Netta Neat,  November 27, 2009 11:32 AM  Follow-up for Phone Call        Janett Billow, do you know what the status of form is? Pls advise thanks! Tilden Dome  November 27, 2009 11:40 AM  Per Dr. Elsworth Soho he signed and gave to [place]  November 27, 2009 4:41 PM   Alida, do you know if this has been faxed back yet?  thanks! Parke Poisson CNA/MA  November 27, 2009 4:46 PM   Additional Follow-up for Phone Call Additional follow up Details #1::        Spoke with Wells Guiles at [facility], form was faxed and was received, order is being shipped to patient. Verdie Mosher CMA  November 28, 2009 9:11 AM  Additional Follow-up by: Verdie Mosher CMA,  November 28, 2009 9:11 AM

## 2010-02-26 NOTE — Assessment & Plan Note (Signed)
Summary: eph/post cath/jml   Visit Type:  Follow-up Referring Provider:  Winn Jock Primary Provider:  [name], MD   History of Present Illness: The patient presents today for routine electrophysiology followup. He reports doing well since last being seen in our clinic.  He continues to have fatigue and SOB with afib.  His afib has returned despite medical therapy with amiodarone.  The patient denies symptoms of  chest pain, orthopnea, PND, lower extremity edema, dizziness, presyncope, syncope, or neurologic sequela.   His leg cramps have resolved.  The patient is tolerating medications without difficulties and is otherwise without complaint today.   Current Medications (verified): 1)  Clonidine Hcl 0.2 Mg Tabs (Clonidine Hcl) .... Take One Tablet By Mouth Two Times A Day 2)  Protonix 40 Mg Tbec (Pantoprazole Sodium) .Marland Kitchen.. 1 By Mouth Two Times A Day 3)  Lisinopril 40 Mg Tabs (Lisinopril) .... Take One Tablet By Mouth Daily 4)  Lipitor 40 Mg Tabs (Atorvastatin Calcium) .... Take One Tablet By Mouth Daily. 5)  Fenofibrate 160 Mg Tabs (Fenofibrate) .... Take One Tablet By Mouth Daily With A Meal 6)  Tamsulosin Hcl 0.4 Mg Caps (Tamsulosin Hcl) .Marland Kitchen.. 1 By Mouth Once Daily 7)  Warfarin Sodium 7.5 Mg Tabs (Warfarin Sodium) .... Use As Directed By Anticoagulation Clinic 8)  Stool Softener 100 Mg Caps (Docusate Sodium) .Marland Kitchen.. 1 By Mouth As Needed 9)  Hydrocodone-Acetaminophen 5-500 Mg Tabs (Hydrocodone-Acetaminophen) .Marland Kitchen.. 1 or 2 Every 6 As Needed 10)  Combivent 103-18 Mcg/act Aero (Ipratropium-Albuterol) .... As Needed 11)  Nitrostat 0.4 Mg Subl (Nitroglycerin) .... As Directed 12)  Aspirin 81 Mg Tbec (Aspirin) .... Take One Tablet By Mouth Daily 13)  Cpap 12 Cm .... Oval Linsey [facility] [phone] 14)  Amiodarone Hcl 200 Mg Tabs (Amiodarone Hcl) .... Take 1 Tablet By Mouth Once A Day 15)  Metoprolol Tartrate 50 Mg Tabs (Metoprolol Tartrate) .... Take 1 Tablet By Mouth Two Times A  Day 16)  Amlodipine Besylate 5 Mg Tabs (Amlodipine Besylate) .... Take One Tablet By Mouth Daily  Allergies: 1)  ! Penicillin  Past History:  Past Medical History: Paroxysmal  s/p PVI 2/11 Atrial flutter s/p atypical AVNRT ablation of the slow pathway GERD (ICD-530.81) NEPHROLITHIASIS (ICD-592.0) DYSLIPIDEMIA (ICD-272.4) HYPERTENSION (ICD-401.9) CAD s/p PCI mid LAD (Vision stent)  08/31/2004,  repeat cath 6/11- no progression of CAD OSA noncompliant with CPAP  Past Surgical History: Reviewed history from 03/19/2009 and no changes required.  History of cholecystectomy.  History of tonsillectomy.  Degenerative joint disease status post multiple diskectomies of the lumbar spine  s/p slow pathway ablation for atypical AVNRT 2007  negative EP study in [facility] 2008  PCI mid LAD (vision) 2006 s/p PVI/ CTI ablation 02/25/09  Family History: Reviewed history from 05/15/2009 and no changes required.  His mother is alive and well at the age of 38. His father  is deceased at the age of 27 with a CVA. He had a history of hypertension,  diabetes, myocardial infarction. He has a 21 year old sister is alive and well  emphysema: mother, maternal grandfather (smoker)  heart disease: father, paternal grandfather   Social History: Reviewed history from 05/15/2009 and no changes required. Resides in [place] with his wife. He has two children and  two grandchildren, He attends [employer].  Prior to his  retirement, he was a [employer] Administrator. He has not smoked since March 2006;  prior to this, he smoked three packs per day for 38  years.  He denies any  alcohol, drugs, herbal medications.  Review of Systems       All systems are reviewed and negative except as listed in the HPI.   Vital Signs:  Patient profile:   60 year old male Height:      72 inches Weight:      289 pounds BMI:     39.34 Pulse rate:   110 / minute BP sitting:   112 / 68  (left arm)  Vitals Entered  By: Margaretmary Bayley CMA (August 07, 2009 11:19 AM)  Physical Exam  General:  Well developed, well nourished, in no acute distress. Head:  normocephalic and atraumatic Eyes:  PERRLA/EOM intact; conjunctiva and lids normal. Mouth:  Teeth, gums and palate normal. Oral mucosa normal. Neck:  Neck supple, no JVD. No masses, thyromegaly or abnormal cervical nodes. Lungs:  Clear bilaterally to auscultation and percussion. Heart:  irrr, no m/r/g Abdomen:  Bowel sounds positive; abdomen soft and non-tender without masses, organomegaly, or hernias noted. No hepatosplenomegaly. Msk:  Back normal, normal gait. Muscle strength and tone normal. Pulses:  pulses normal in all 4 extremities Extremities:  No clubbing or cyanosis. Neurologic:  Alert and oriented x 3. Skin:  Intact without lesions or rashes.   Procedure date:  06/25/2009  Findings:      Study Conclusions   Left ventricle: The cavity size was normal. Wall thickness was   increased in a pattern of moderate LVH. Systolic function was   normal. The estimated ejection fraction was in the range of 55% to   60%. Wall motion was normal; there were no regional wall motion   abnormalities.    Transthoracic echocardiography. M-mode, complete   2D, spectral Doppler, and color Doppler. Blood pressure: 118/74.   Patient status: Inpatient. Location: Bedside.  Left atrium                        time   AP dim             35 mm   ------  Peak E/A ratio   0.9      -------    --------------------------------------------------------------------   Prepared and Electronically Authenticated by    Jenkins Rouge, MD, [facility]   [id number]    Signed by Margaretmary Bayley CMA on 08/07/2009 at 8:08 AM    EKG  Procedure date:  08/07/2009  Findings:      afib,  V rate 110  Impression & Recommendations:  Problem # 1:  ATRIAL FIBRILLATION (ICD-427.31) The patient has recurrent symtpomatic atrial fibrillation s/p ablation despite medical therapy with  amiodarone.  Therapeutic strategies for afib including medicine and ablation were discussed in detail with the patient today. Risk, benefits, and alternatives to EP study and radiofrequency ablation for afib were also discussed in detail today. These risks include but are not limited to stroke, bleeding, vascular damage, tamponade, perforation, damage to the esophagus, lungs, and other structures, pulmonary vein stenosis, worsening renal function, and death. The patient understands these risk and wishes to proceed.   We will obtain a cardiac CT to evaluate his pulmonary venous anatomy and rule out pulmonary vein stenosis given his SOB.  WE will then proceed with catheter ablation at the next available time.  Problem # 2:  OBSTRUCTIVE SLEEP APNEA (ICD-780.57) CPAP encouraged  Problem # 3:  HYPERTENSION (ICD-401.9) stable  Problem # 4:  DYSLIPIDEMIA (ICD-272.4) stable His updated medication list for this problem includes:    Lipitor  40 Mg Tabs (Atorvastatin calcium) .Marland Kitchen... Take one tablet by mouth daily.    Fenofibrate 160 Mg Tabs (Fenofibrate) .Marland Kitchen... Take one tablet by mouth daily with a meal  Other Orders: Cardiac CTA (Cardiac CTA)  Patient Instructions: 1)  Your physician recommends that you continue on your current medications as directed. Please refer to the Current Medication list given to you today. 2)  Your physician has recommended that you have an ablation.  Catheter ablation is a medical procedure used to treat some cardiac arrhythmias (irregular heartbeats). During catheter ablation, a long, thin, flexible tube is put into a blood vessel in your groin (upper thigh), or neck. This tube is called an ablation catheter. It is then guided to your heart through the blood vessel. Radiofrequency waves destroy small areas of heart tissue where abnormal heartbeats may cause an arrhythmia to start.  Please see the instruction sheet given to you today. 3)  Your physician has requested that you have a  cardiac CT.  Cardiac computed tomography (CT) is a painless test that uses an x-ray machine to take clear, detailed pictures of your heart.  For further information please visit [email or web address].  Please follow instruction sheet as given.

## 2010-02-26 NOTE — Assessment & Plan Note (Signed)
Summary: rov/H&P for ablation   Visit Type:  Follow-up Referring Provider:  [name], MD Primary Provider:  [name], MD   History of Present Illness: The patient presents today for electrophysiology followup.  He continues to have daily episodes of arrhythmias despite medical therapy with Amiodarone.  He reports palpitations and fatigue.  He also reports having sypmtoms of cough and SOB for 2 wks for which he was diagnosed with a URI and has  been taking Avelox.  The patient denies symptoms of chest pain, shortness of breath, orthopnea, PND, lower extremity edema, dizziness, presyncope, syncope, or neurologic sequela. The patient is tolerating medications without difficulties and is otherwise without complaint today.   Current Medications (verified): 1)  Clonidine Hcl 0.2 Mg Tabs (Clonidine Hcl) .... Take One Tablet By Mouth Two Times A Day 2)  Protonix 40 Mg Tbec (Pantoprazole Sodium) .Marland Kitchen.. 1 By Mouth Once Daily 3)  Lisinopril 40 Mg Tabs (Lisinopril) .... Take One Tablet By Mouth Daily 4)  Lipitor 40 Mg Tabs (Atorvastatin Calcium) .... Take One Tablet By Mouth Daily. 5)  Fenofibrate 160 Mg Tabs (Fenofibrate) .... Take One Tablet By Mouth Daily With A Meal 6)  Tamsulosin Hcl 0.4 Mg Caps (Tamsulosin Hcl) .Marland Kitchen.. 1 By Mouth Once Daily 7)  Warfarin Sodium 7.5 Mg Tabs (Warfarin Sodium) .... Use As Directed By Anticoagulation Clinic 8)  Stool Softener 100 Mg Caps (Docusate Sodium) .Marland Kitchen.. 1 By Mouth As Needed 9)  Hydrocodone-Acetaminophen 5-500 Mg Tabs (Hydrocodone-Acetaminophen) .Marland Kitchen.. 1 or 2 Every 6 As Needed 10)  Combivent 103-18 Mcg/act Aero (Ipratropium-Albuterol) .... Inhale 2 Puffs For Times A Day As Needed 11)  Nitrostat 0.4 Mg Subl (Nitroglycerin) .... As Directed 12)  Amiodarone Hcl 200 Mg Tabs (Amiodarone Hcl) .... Take 1 By Mouth Once Daily 13)  Aspirin 81 Mg Tbec (Aspirin) .... Take One Tablet By Mouth Daily 14)  Avelox 400 Mg Tabs (Moxifloxacin Hcl) .... Take One Tablet By Mouth Once  Daily.  Allergies: 1)  ! Penicillin  Past History:  Past Medical History: Reviewed history from 12/09/2008 and no changes required. Paroxysmal afib Atrial flutter s/p atypical AVNRT ablation of the slow pathway GERD (ICD-530.81) NEPHROLITHIASIS (ICD-592.0) DYSLIPIDEMIA (ICD-272.4) HYPERTENSION (ICD-401.9) CAD s/p PCI mid LAD (Vision stent)  08/31/2004 OSA noncompliant with CPAP  Past Surgical History: Reviewed history from 12/09/2008 and no changes required.  History of cholecystectomy.  History of tonsillectomy.  Degenerative joint disease status post multiple diskectomies of the lumbar spine  s/p slow pathway ablation for atypical AVNRT 2007  negative EP study in [facility] 2008  PCI mid LAD (vision) 2006  Family History: Reviewed history from 10/15/2008 and no changes required.  His mother is alive and well at the age of 44. His father  is deceased at the age of 62 with a CVA. He had a history of hypertension,  diabetes, myocardial infarction. He has a 3 year old sister is alive and well  Social History: Reviewed history from 12/09/2008 and no changes required. Resides in [place] with his wife. He has two children and  two grandchildren, He attends [employer].  Prior to his  retirement, he was a [employer] Administrator. He has not smoked since March 2006;  prior to this, he smoked three packs per day for 45 years. He denies any  alcohol, drugs, herbal medications.  Review of Systems       All systems are reviewed and negative except as listed in the HPI.   Vital Signs:  Patient profile:  60 year old male Height:      72 inches Weight:      274 pounds BMI:     37.30 Pulse rate:   71 / minute BP sitting:   134 / 90  (left arm)  Vitals Entered By: Margaretmary Bayley CMA (February 19, 2009 9:13 AM)  Physical Exam  General:  Well developed, well nourished, in no acute distress. Head:  normocephalic and atraumatic Eyes:  PERRLA/EOM intact; conjunctiva and  lids normal. Nose:  no deformity, discharge, inflammation, or lesions Mouth:  Teeth, gums and palate normal. Oral mucosa normal. Neck:  Neck supple, no JVD. No masses, thyromegaly or abnormal cervical nodes. Lungs:  Clear bilaterally to auscultation and percussion. Heart:  Non-displaced PMI, chest non-tender; regular rate and rhythm, S1, S2 without murmurs, rubs or gallops. Carotid upstroke normal, no bruit. Normal abdominal aortic size, no bruits. Femorals normal pulses, no bruits. Pedals normal pulses. No edema, no varicosities. Abdomen:  Bowel sounds positive; abdomen soft and non-tender without masses, organomegaly, or hernias noted. No hepatosplenomegaly. Msk:  Back normal, normal gait. Muscle strength and tone normal. Pulses:  pulses normal in all 4 extremities Extremities:  No clubbing or cyanosis. Neurologic:  Alert and oriented x 3.  CNII-XII intact, strength/sensation are intact Skin:  Intact without lesions or rashes. Cervical Nodes:  no significant adenopathy Psych:  Normal affect.   EKG  Procedure date:  02/19/2009  Findings:      sinus rhythm, LAD  Impression & Recommendations:  Problem # 1:  ATRIAL FIBRILLATION (ICD-427.31) The patient has atrial fibrillation and typical appearing atrial flutter.  He is quite symptomatic and has failed medical therapy with sotalol and amiodarone.  He is appropriately anticoagulated with coumadin.  Therapeutic strategies for afib including medicine and ablation were again discussed in detail with the patient today. Risk, benefits, and alternatives to EP study and radiofrequency ablation for afib were again discussed in detail today. These risks include but are not limited to stroke, bleeding, vascular damage, tamponade, perforation, damage to the esophagus, lungs, and other structures, pulmonary vein stenosis, worsening renal function, and death. The patient understands these risk and wishes to proceed. I have reviewed his TTE which was  performed today.  This reveals no signifcant structual abnormalities, no significant MR, and nl LA size.  He is scheduled for ablation for afib and atrial flutter next week.  His updated medication list for this problem includes:    Warfarin Sodium 7.5 Mg Tabs (Warfarin sodium) ..... Use as directed by anticoagulation clinic    Amiodarone Hcl 200 Mg Tabs (Amiodarone hcl) .Marland Kitchen... Take 1 by mouth once daily    Aspirin 81 Mg Tbec (Aspirin) .Marland Kitchen... Take one tablet by mouth daily  Problem # 2:  HYPERTENSION (ICD-401.9) stable no changes today  His updated medication list for this problem includes:    Clonidine Hcl 0.2 Mg Tabs (Clonidine hcl) .Marland Kitchen... Take one tablet by mouth two times a day    Lisinopril 40 Mg Tabs (Lisinopril) .Marland Kitchen... Take one tablet by mouth daily    Aspirin 81 Mg Tbec (Aspirin) .Marland Kitchen... Take one tablet by mouth daily  Orders: TLB-BMP (Basic Metabolic Panel-BMET) (63149-FWYOVZC) TLB-CBC Platelet - w/Differential (85025-CBCD) TLB-PT (Protime) (85610-PTP) TLB-PTT (85730-PTTL)  Other Orders: EKG w/ Interpretation (93000)  Patient Instructions: 1)  Your physician recommends that you return for lab work today 2)  Your physician has recommended that you have an ablation.  Catheter ablation is a medical procedure used to treat some cardiac arrhythmias (irregular heartbeats).  During catheter ablation, a long, thin, flexible tube is put into a blood vessel in your groin (upper thigh), or neck. This tube is called an ablation catheter. It is then guided to your heart through the blood vessel. Radiofrequency waves destroy small areas of heart tissue where abnormal heartbeats may cause an arrhythmia to start.  Please see the instruction sheet given to you today.

## 2010-02-26 NOTE — Assessment & Plan Note (Signed)
Summary: rov   Visit Type:  Follow-up Referring Provider:  Winn Jock Primary Provider:  [name], MD   History of Present Illness: Mr Marchena presents today for evaluation of chest pain.  He reports that over the past week, he has episodes of L sided chest pressure.  Episodes typically occur with exertion and resolve with resting.  He is not certain as the whether or not this pain is similar to his prior angina.  He also reports episodic palpitations but is not clear as to whether or not this is afib.  He reports mild SOB as well as leg "weakness".  He is otherwise without complaint.  Current Medications (verified): 1)  Clonidine Hcl 0.2 Mg Tabs (Clonidine Hcl) .... Take One Tablet By Mouth Two Times A Day 2)  Protonix 40 Mg Tbec (Pantoprazole Sodium) .Marland Kitchen.. 1 By Mouth Two Times A Day 3)  Lisinopril 40 Mg Tabs (Lisinopril) .... Take One Tablet By Mouth Daily 4)  Lipitor 40 Mg Tabs (Atorvastatin Calcium) .... Take One Tablet By Mouth Daily. 5)  Fenofibrate 160 Mg Tabs (Fenofibrate) .... Take One Tablet By Mouth Daily With A Meal 6)  Tamsulosin Hcl 0.4 Mg Caps (Tamsulosin Hcl) .Marland Kitchen.. 1 By Mouth Once Daily 7)  Warfarin Sodium 7.5 Mg Tabs (Warfarin Sodium) .... Use As Directed By Anticoagulation Clinic 8)  Stool Softener 100 Mg Caps (Docusate Sodium) .Marland Kitchen.. 1 By Mouth As Needed 9)  Hydrocodone-Acetaminophen 5-500 Mg Tabs (Hydrocodone-Acetaminophen) .Marland Kitchen.. 1 or 2 Every 6 As Needed 10)  Combivent 103-18 Mcg/act Aero (Ipratropium-Albuterol) .... As Needed 11)  Nitrostat 0.4 Mg Subl (Nitroglycerin) .... As Directed 12)  Chlorthalidone 25 Mg Tabs (Chlorthalidone) .... Take One Tablet By Mouth Once Daily. 13)  Aspirin 81 Mg Tbec (Aspirin) .... Take One Tablet By Mouth Daily 14)  Cpap 12 Cm  Allergies (verified): 1)  ! Penicillin  Past History:  Past Medical History: Paroxysmal  s/p PVI 2/11 Atrial flutter s/p atypical AVNRT ablation of the slow pathway GERD  (ICD-530.81) NEPHROLITHIASIS (ICD-592.0) DYSLIPIDEMIA (ICD-272.4) HYPERTENSION (ICD-401.9) CAD s/p PCI mid LAD (Vision stent)  08/31/2004 OSA noncompliant with CPAP  Past Surgical History: Reviewed history from 03/19/2009 and no changes required.  History of cholecystectomy.  History of tonsillectomy.  Degenerative joint disease status post multiple diskectomies of the lumbar spine  s/p slow pathway ablation for atypical AVNRT 2007  negative EP study in [facility] 2008  PCI mid LAD (vision) 2006 s/p PVI/ CTI ablation 02/25/09  Family History: Reviewed history from 05/15/2009 and no changes required.  His mother is alive and well at the age of 15. His father  is deceased at the age of 44 with a CVA. He had a history of hypertension,  diabetes, myocardial infarction. He has a 52 year old sister is alive and well  emphysema: mother, maternal grandfather (smoker)  heart disease: father, paternal grandfather   Social History: Reviewed history from 05/15/2009 and no changes required. Resides in [place] with his wife. He has two children and  two grandchildren, He attends [employer].  Prior to his  retirement, he was a [employer] Administrator. He has not smoked since March 2006;  prior to this, he smoked three packs per day for 38  years. He denies any  alcohol, drugs, herbal medications.  Review of Systems       All systems are reviewed and negative except as listed in the HPI.   Vital Signs:  Patient profile:   60 year old male Height:  72 inches Weight:      281 pounds BMI:     38.25 Pulse rate:   73 / minute BP sitting:   118 / 74  (left arm)  Vitals Entered By: Margaretmary Bayley CMA (June 25, 2009 11:19 AM)  Physical Exam  General:  Well developed, well nourished, in no acute distress. Head:  normocephalic and atraumatic Eyes:  PERRLA/EOM intact; conjunctiva and lids normal. Mouth:  Teeth, gums and palate normal. Oral mucosa normal. Neck:  Neck supple, no JVD.  No masses, thyromegaly or abnormal cervical nodes. Lungs:  Clear bilaterally to auscultation and percussion. Heart:  Non-displaced PMI, chest non-tender; regular rate and rhythm, S1, S2 without murmurs, rubs or gallops. Carotid upstroke normal, no bruit. Normal abdominal aortic size, no bruits. Femorals normal pulses, no bruits. Pedals normal pulses. No edema, no varicosities. Abdomen:  Bowel sounds positive; abdomen soft and non-tender without masses, organomegaly, or hernias noted. No hepatosplenomegaly. Msk:  Back normal, normal gait. Muscle strength and tone normal. Pulses:  pulses normal in all 4 extremities Extremities:  No clubbing or cyanosis. Neurologic:  Alert and oriented x 3.   EKG  Procedure date:  06/25/2009  Findings:      sinus rhythm 75 bpm, otherwise normal ekg  Impression & Recommendations:  Problem # 1:  CHEST PAIN UNSPECIFIED (ICD-786.50) The patient presents today with chest pain.  Though his pain has both typical and atypical features, I am concerned that it is increasing and exertional.  He has a h/o known CAD.  I would therefore recommend admission to rule out MI and then proceed with left heart catheterization once INR will allow.  He is now 3 months s/p ablation and therefore could have coumadin reversed if necessary.  Problem # 2:  ATRIAL FIBRILLATION (ICD-427.31) stable off amiodarone continue coumadin longterm will evaluate on telemetry for recurrence of afib while in hospital  Problem # 3:  HYPERTENSION (ICD-401.9) improved with chlorthalidone, though he now has muscle cramping we will check BMET to evaluate K and creatinine

## 2010-02-26 NOTE — Assessment & Plan Note (Signed)
Summary: Steve Anthony   Visit Type:  Follow-up Referring Provider:  [name], MD Primary Provider:  [name], MD   History of Present Illness: The patient presents today for electrophysiology followup. He reports doing very well since his recent afib ablation.  He states that he hasnt felt this well in a long time.  He was given O2 upon hospital discharge but rarely uses it and denies significant dyspnea.   He is unaware of any further afib.  The patient denies symptoms of palpitations, chest pain,  lower extremity edema, dizziness, presyncope, syncope, or neurologic sequela. The patient is tolerating medications without difficulties and is otherwise without complaint today.   Current Medications (verified): 1)  Clonidine Hcl 0.2 Mg Tabs (Clonidine Hcl) .... Take One Tablet By Mouth Two Times A Day 2)  Protonix 40 Mg Tbec (Pantoprazole Sodium) .Marland Kitchen.. 1 By Mouth Two Times A Day 3)  Lisinopril 40 Mg Tabs (Lisinopril) .... Take One Tablet By Mouth Daily 4)  Lipitor 40 Mg Tabs (Atorvastatin Calcium) .... Take One Tablet By Mouth Daily. 5)  Fenofibrate 160 Mg Tabs (Fenofibrate) .... Take One Tablet By Mouth Daily With A Meal 6)  Tamsulosin Hcl 0.4 Mg Caps (Tamsulosin Hcl) .Marland Kitchen.. 1 By Mouth Once Daily 7)  Warfarin Sodium 7.5 Mg Tabs (Warfarin Sodium) .... Use As Directed By Anticoagulation Clinic 8)  Stool Softener 100 Mg Caps (Docusate Sodium) .Marland Kitchen.. 1 By Mouth As Needed 9)  Hydrocodone-Acetaminophen 5-500 Mg Tabs (Hydrocodone-Acetaminophen) .Marland Kitchen.. 1 or 2 Every 6 As Needed 10)  Combivent 103-18 Mcg/act Aero (Ipratropium-Albuterol) .... As Needed 11)  Nitrostat 0.4 Mg Subl (Nitroglycerin) .... As Directed 12)  Amiodarone Hcl 200 Mg Tabs (Amiodarone Hcl) .... Take 1/2 Tablet By Mouth Once Daily 13)  Aspirin 81 Mg Tbec (Aspirin) .... Take One Tablet By Mouth Daily  Allergies (verified): 1)  ! Penicillin  Past History:  Past Medical History: Last updated: 12/09/2008 Paroxysmal afib Atrial flutter s/p  atypical AVNRT ablation of the slow pathway GERD (ICD-530.81) NEPHROLITHIASIS (ICD-592.0) DYSLIPIDEMIA (ICD-272.4) HYPERTENSION (ICD-401.9) CAD s/p PCI mid LAD (Vision stent)  08/31/2004 OSA noncompliant with CPAP  Past Surgical History:  History of cholecystectomy.  History of tonsillectomy.  Degenerative joint disease status post multiple diskectomies of the lumbar spine  s/p slow pathway ablation for atypical AVNRT 2007  negative EP study in [facility] 2008  PCI mid LAD (vision) 2006 s/p PVI/ CTI ablation 02/25/09  Vital Signs:  Patient profile:   60 year old male Height:      72 inches Weight:      282 pounds BMI:     38.38 O2 Sat:      94 % on 2 L/min Pulse rate:   67 / minute BP sitting:   144 / 100  (left arm)  Vitals Entered By: Margaretmary Bayley CMA (March 19, 2009 2:15 PM)  O2 Flow:  2 L/min  Physical Exam  General:  Well developed, well nourished, in no acute distress. Head:  normocephalic and atraumatic Eyes:  PERRLA/EOM intact; conjunctiva and lids normal. Mouth:  Teeth, gums and palate normal. Oral mucosa normal. Neck:  Neck supple, no JVD. No masses, thyromegaly or abnormal cervical nodes. Lungs:  Clear bilaterally to auscultation and percussion. Heart:  Non-displaced PMI, chest non-tender; regular rate and rhythm, S1, S2 without murmurs, rubs or gallops. Carotid upstroke normal, no bruit. Normal abdominal aortic size, no bruits. Femorals normal pulses, no bruits. Pedals normal pulses. No edema, no varicosities. Abdomen:  Bowel sounds positive; abdomen soft  and non-tender without masses, organomegaly, or hernias noted. No hepatosplenomegaly. Msk:  Back normal, normal gait. Muscle strength and tone normal. Pulses:  pulses normal in all 4 extremities Extremities:  No clubbing or cyanosis. Neurologic:  Alert and oriented x 3. Skin:  Intact without lesions or rashes. Psych:  Normal affect. Additional Exam:  with ambulation, sats went to 89% but quickly returned to  96% without significant dypsnea.   EKG  Procedure date:  03/19/2009  Findings:      sinus rhythm 67 bpm,  LAD,  otherwise normal ekg  Impression & Recommendations:  Problem # 1:  ATRIAL FIBRILLATION (ICD-427.31) Doing very well s/p CTI/ afib ablation.  Maintaining sinus rhythm. We will plan to stop amiodarone if no further afib upon return. Continue coumadin.  His updated medication list for this problem includes:    Warfarin Sodium 7.5 Mg Tabs (Warfarin sodium) ..... Use as directed by anticoagulation clinic    Amiodarone Hcl 200 Mg Tabs (Amiodarone hcl) .Marland Kitchen... Take 1/2 tablet by mouth once daily    Aspirin 81 Mg Tbec (Aspirin) .Marland Kitchen... Take one tablet by mouth daily  Orders: EKG w/ Interpretation (93000)  Problem # 2:  SHORTNESS OF BREATH (ICD-786.05) stable, likely due to COPD. He is minimally volume overloaded.  We will give lasix 20m daily x 5 days. D/c O2 unless dypsnea worsens Pt to contact my office for worsening dypsnea.  His updated medication list for this problem includes:    Lisinopril 40 Mg Tabs (Lisinopril) ..Marland Kitchen.. Take one tablet by mouth daily    Aspirin 81 Mg Tbec (Aspirin) ..Marland Kitchen.. Take one tablet by mouth daily    Lasix 40 Mg Tabs (Furosemide) ..... Once daily for 5 days  Patient Instructions: 1)  Your physician has recommended you make the following change in your medication: STOP OXYGEN 2)  TAKE LASIX 40MG FOR 5 DAYS 3)  Your physician recommends that you schedule a follow-up appointment in: 3 MONTHS Prescriptions: LASIX 40 MG TABS (FUROSEMIDE) once daily for 5 days  #5 x 0   Entered by:   MBarnett Abu RN, BSN   Authorized by:   JThompson Grayer MD   Signed by:   MBarnett Abu RN, BSN on 03/19/2009   Method used:   Electronically to        [facility] #[phone] (retail)       [address]      [place]  [address]      Ph: [phone] [phone]      Fax: [phone]  RxID:    [id number]

## 2010-02-26 NOTE — Progress Notes (Signed)
Summary: question on meds  Phone Note Call from Patient Call back at Home Phone [phone]   Caller: Patient Reason for Call: Talk to Nurse Summary of Call: pt has question re meds Initial call taken by: Regan Lemming,  January 07, 2010 9:00 AM  Follow-up for Phone Call        pt  is going to stop Coumadin a have his INR checked.  If it is below 2.0  he will let me know  We will call in Pradaxa 12m two times a day for him KJanan Halter RN, BSN  January 08, 2010 1:16 PM    New/Updated Medications: PRADAXA 150 MG CAPS (DABIGATRAN ETEXILATE MESYLATE) one by mouth bid Prescriptions: PRADAXA 150 MG CAPS (DABIGATRAN ETEXILATE MESYLATE) one by mouth bid  #60 x 3   Entered by:   KJanan Halter RN, BSN   Authorized by:   JThompson Grayer MD   Signed by:   KJanan Halter RN, BSN on 01/08/2010   Method used:   Electronically to        [facility] #[phone] (retail)       [address]      [place]  [address]      Ph: [phone] [phone]      Fax: [phone]  RxID:   [id number]

## 2010-02-26 NOTE — Letter (Signed)
Summary: CPAP Physician Order/[facility]  CPAP Physician Order/[facility]   Imported By: Phillis Knack 11/20/2009 14:48:31  _____________________________________________________________________  External Attachment:    Type:   Image     Comment:   External Document

## 2010-02-26 NOTE — Progress Notes (Signed)
Summary: elevated hr/med question  Phone Note Call from Patient Call back at Home Phone [phone]   Caller: Patient Reason for Call: Talk to Nurse Summary of Call: elevated hr, wonders if he needs to go back on his med....  amiodarone Initial call taken by: Darnell Level,  Jun 24, 2009 8:05 AM  Follow-up for Phone Call        Since Thurs feels like his legs are heavy,ache and cramping.  HR going fro 44-115 Fri 95/64 HR173 Sun 74/55 HR wouldnt reg 89/62 HR 44 129/100 [place], RN, BSN  Jun 24, 2009 9:12 AM Call pt back at [phone] [facility]  Jun 24, 2009 11:11 AM Pt calling back not feeling well pt is SOB and heart skipping beats Delsa Sale  June 25, 2009 8:27 AM  Additional Follow-up for Phone Call Additional follow up Details #1::        coming in today to see Dr Rayann Heman Janan Halter, RN, BSN  June 25, 2009 9:58 AM

## 2010-02-26 NOTE — Progress Notes (Signed)
Summary: nasal prings  Phone Note Call from Patient Call back at Home Phone [phone]   Caller: Patient Call For: Elsworth Soho Reason for Call: Talk to Nurse Summary of Call: pt gets his cpap supplies from [facility].  [facility] [phone] Please notify pt when done. Initial call taken by: Zigmund Gottron,  July 21, 2009 3:15 PM  Follow-up for Phone Call        [facility]axed order [facility] prongs [facility] [phone] pt is aware  Follow-up by: Ova Freshwater,  July 22, 2009 10:41 AM

## 2010-02-26 NOTE — Progress Notes (Signed)
Summary: discuss new med  Phone Note Call from Patient Call back at [facility] Phone [phone]   Caller: Patient Reason for Call: Talk to Nurse Summary of Call: discuss new meds.  Initial call taken by: Neil Crouch,  January 05, 2010 8:18 AM  Follow-up for Phone Call        Hold Warfarin for 2 days then get labs checked and let me know when the INR is less than 1.8  He says Pradaxa will only cost his $40/month He will let me know when he is ready to make the change Janan Halter, RN, BSN  January 05, 2010 12:21 PM

## 2010-02-26 NOTE — Progress Notes (Signed)
Summary: returning call  Phone Note Call from Patient Call back at Home Phone [phone]   Caller: Patient Reason for Call: Talk to Nurse Summary of Call: returning call Initial call taken by: Darnell Level,  April 25, 2009 12:06 PM  Follow-up for Phone Call        lmom Janan Halter, RN, BSN  April 25, 2009 12:11 PM pt will have his Protonix filled by Dr Willette Pa office Janan Halter, RN, BSN  April 25, 2009 2:14 PM

## 2010-02-26 NOTE — Progress Notes (Signed)
Summary: CALLING REGARDING OXYGEN  Phone Note Call from Patient Call back at Home Phone [phone]   Caller: Patient Summary of Call: PT CALLING ABOUT HIS OXYGEN  NEED OFFICE TO SEND ORDER  TO [facility] SO THEY WILL COME AND PICK UP [place]. Initial call taken by: Delsa Sale,  March 27, 2009 10:36 AM  Follow-up for Phone Call        [facility] that I had sent in his oxygen D/C papers on the 03/20/09 Janan Halter, RN, BSN  March 27, 2009 10:45 AM

## 2010-02-26 NOTE — Progress Notes (Signed)
Summary: wondering about labwork  Phone Note Call from Patient Call back at Home Phone [phone]   Caller: Patient Reason for Call: Talk to Nurse, Talk to Doctor Summary of Call: wants to know if you got lab work and if he needs to make any changes please call him Initial call taken by: Shelda Pal,  August 29, 2009 9:48 AM  Follow-up for Phone Call        INR 2.2  continue taking as directed and who ever is following his Coumadin should continue to follow Janan Halter, RN, BSN  August 29, 2009 12:30 PM

## 2010-02-26 NOTE — Assessment & Plan Note (Signed)
Summary: copd/emphysema/apc   Copy to:  [facility] Primary Provider/Referring Provider:  [name], MD   History of Present Illness: 58/M, ex smoker for management of COPD & obstructive sleep apnea. He reports dyspnea on exertion but states he can walk for a mile or so on level ground now that his heart rhythm has been restored post ablation for A fibn by Dr Rayann Heman. He desaturates from 96 to 89 % on walking but surprisingly his FEV1 is relatively maintained at 76% on spirometry .Spirometry suggests mild airway obstruction & mild restriction likey due to obesity. CXR 10/09/08 showed hyperinflation & 1/13 /11 suggested some interstitial prominence. A 6 mm nodule noted on CT in 10/08 was not seen on CT angio in  8/10 performed for chest pain to r/o aortic dissection. Nuclear stress test did not show cardiac ischemia & showed nml LVEF. He was placed on home O2 afer adnmission for AF ablation when a satn was documented as 83% on RA, but this was dc'd in april 2011 . Baseline PSG in 11/08 showed severe obstructive sleep apnea with AHI 97/h, nadir desatn 76% corrected by CPAP 12 cm with large full face mask. He denies excessive daytime somnolence, mask ok, pressure ok, reports complaince with CPAP He is disabled since 2005 , used to be a doT driver.   Medications Prior to Update: 1)  Clonidine Hcl 0.2 Mg Tabs (Clonidine Hcl) .... Take One Tablet By Mouth Two Times A Day 2)  Protonix 40 Mg Tbec (Pantoprazole Sodium) .Marland Kitchen.. 1 By Mouth Two Times A Day 3)  Lisinopril 40 Mg Tabs (Lisinopril) .... Take One Tablet By Mouth Daily 4)  Lipitor 40 Mg Tabs (Atorvastatin Calcium) .... Take One Tablet By Mouth Daily. 5)  Fenofibrate 160 Mg Tabs (Fenofibrate) .... Take One Tablet By Mouth Daily With A Meal 6)  Tamsulosin Hcl 0.4 Mg Caps (Tamsulosin Hcl) .Marland Kitchen.. 1 By Mouth Once Daily 7)  Warfarin Sodium 7.5 Mg Tabs (Warfarin Sodium) .... Use As Directed By Anticoagulation Clinic 8)  Stool Softener 100 Mg  Caps (Docusate Sodium) .Marland Kitchen.. 1 By Mouth As Needed 9)  Hydrocodone-Acetaminophen 5-500 Mg Tabs (Hydrocodone-Acetaminophen) .Marland Kitchen.. 1 or 2 Every 6 As Needed 10)  Combivent 103-18 Mcg/act Aero (Ipratropium-Albuterol) .... As Needed 11)  Nitrostat 0.4 Mg Subl (Nitroglycerin) .... As Directed 12)  Amiodarone Hcl 200 Mg Tabs (Amiodarone Hcl) .... Take 1/2 Tablet By Mouth Once Daily 13)  Aspirin 81 Mg Tbec (Aspirin) .... Take One Tablet By Mouth Daily 14)  Lasix 40 Mg Tabs (Furosemide) .... Once Daily For 5 Days  Allergies (verified): 1)  ! Penicillin  Past History:  Past Medical History: Reviewed history from 12/09/2008 and no changes required. Paroxysmal afib Atrial flutter s/p atypical AVNRT ablation of the slow pathway GERD (ICD-530.81) NEPHROLITHIASIS (ICD-592.0) DYSLIPIDEMIA (ICD-272.4) HYPERTENSION (ICD-401.9) CAD s/p PCI mid LAD (Vision stent)  08/31/2004 OSA noncompliant with CPAP  Past Surgical History: Reviewed history from 03/19/2009 and no changes required.  History of cholecystectomy.  History of tonsillectomy.  Degenerative joint disease status post multiple diskectomies of the lumbar spine  s/p slow pathway ablation for atypical AVNRT 2007  negative EP study in [facility] 2008  PCI mid LAD (vision) 2006 s/p PVI/ CTI ablation 02/25/09  Family History:  His mother is alive and well at the age of 60. His father  is deceased at the age of 82 with a CVA. He had a history of hypertension,  diabetes, myocardial infarction. He has a 28 year old sister is alive  and well  emphysema: mother, maternal grandfather (smoker)  heart disease: father, paternal grandfather   Social History: Resides in [place] with his wife. He has two children and  two grandchildren, He attends [employer].  Prior to his  retirement, he was a [employer] Administrator. He has not smoked since March 2006;  prior to this, he smoked three packs per day for 38  years. He denies any  alcohol, drugs,  herbal medications.  Review of Systems  The patient denies shortness of breath with activity, shortness of breath at rest, productive cough, non-productive cough, coughing up blood, chest pain, irregular heartbeats, acid heartburn, indigestion, loss of appetite, weight change, abdominal pain, difficulty swallowing, sore throat, tooth/dental problems, headaches, nasal congestion/difficulty breathing through nose, sneezing, itching, ear ache, anxiety, depression, hand/feet swelling, joint stiffness or pain, rash, change in color of mucus, and fever.    Vital Signs:  Patient profile:   60 year old male Height:      72 inches Weight:      288 pounds O2 Sat:      95 % on Room air Pulse rate:   76 / minute BP sitting:   146 / 88  (right arm) Cuff size:   large  Vitals Entered By: Matthew Folks LPN (May 16, 8467 6:29 PM)  O2 Flow:  Room air  Serial Vital Signs/Assessments:  Comments: 2:38 PM Ambulatory Pulse Oximetry  Resting; HR__69___    02 Sat_96% RA___  Lap1 (185 feet)   HR___97__   02 Sat__93% RA___ Lap2 (185 feet)   HR__110___   02 Sat__89% RA___    Lap3 (185 feet)   HR__101___   02 Sat__89% RA___  _X__Test Completed without Difficulty ___Test Stopped due to:  Charma Igo SMA  May 15, 2009 2:38 PM    By: Matthew Folks LPN    Physical Exam  Additional Exam:  Gen. Pleasant, well-nourished, in no distress, normal affect ENT - no lesions, no post nasal drip,class 2 airway Neck: No JVD, no thyromegaly, no carotid bruits Lungs: no use of accessory muscles, no dullness to percussion, clear without rales or rhonchi  Cardiovascular: Rhythm regular, heart sounds  normal, no murmurs or gallops, no peripheral edema Abdomen: soft and non-tender, no hepatosplenomegaly, BS normal. Musculoskeletal: No deformities, no cyanosis or clubbing Neuro:  alert, non focal     Pulmonary Function Test Date: 05/15/2009 2:26 PM Gender: Male  Pre-Spirometry FVC    Value: 4.15 L/min    % Pred: 80.70 % FEV1    Value: 2.98 L     Pred: 3.92 L     % Pred: 76 % FEV1/FVC  Value: 71.82 %     % Pred: 94.10 %  Impression & Recommendations:  Problem # 1:  SHORTNESS OF BREATH (ICD-786.05)  His dyspnea & hypoxemia are multifactorial  - AFibn, COPD & obesity.  Orders: Consultation Level IV (52841) Spirometry w/Graph (94010)  Problem # 2:  C O P D (ICD-496) Surprisingly, lung function is preserved inspite of heavy smoking in the past. Combivent ok for now as needed   Problem # 3:  OBSTRUCTIVE SLEEP APNEA (ICD-780.57)  Compliance encouraged, wt loss emphasized, asked to avoid meds with sedative side effects, cautioned against driving when sleepy.  ct CPAP 12 cm Obtain download & suggest changes as needed  Orders: Consultation Level IV (32440) Spirometry w/Graph (94010)  Medications Added to Medication List This Visit: 1)  Cpap 12 Cm   Patient Instructions: 1)  Turn in the  chip so we can look at the download. 2)  Please schedule a follow-up appointment in 2 months. 3)  Use combivent MDI as needed upto 3 times/day 4)  Copy sent WT:UUEK Grisso   CardioPerfect Spirometry  ID: [id number] Patient: Steve Anthony, CRICKENBERGER DOB: 08-16-1950 Age: 60 Years Old Sex: Male Race: White Height: 72 Weight: 288 Status: Confirmed Past Medical History:  Paroxysmal afib Atrial flutter s/p atypical AVNRT ablation of the slow pathway GERD (ICD-530.81) NEPHROLITHIASIS (ICD-592.0) DYSLIPIDEMIA (ICD-272.4) HYPERTENSION (ICD-401.9) CAD s/p PCI mid LAD (Vision stent)  08/31/2004 OSA noncompliant with CPAP   Recorded: 05/15/2009 2:26 PM  Parameter  Measured Predicted %Predicted FVC     4.15        5.14        80.70 FEV1     2.98        3.92        76 FEV1%   71.82        76.29        94.10 PEF    5.30        9.81        54.10   Interpretation: Mild restriction mild airway obstruction     Appended Document: copd/emphysema/apc download 2/23- 05/15/09 >> good compliance on 12  cm, leak +, residual AHI ok no changes

## 2010-02-26 NOTE — Miscellaneous (Signed)
Summary: Orders Update  Clinical Lists Changes  Orders: Added new Test order of TLB-CBC Platelet - w/Differential (85025-CBCD) - Signed Added new Test order of TLB-PT (Protime) (85610-PTP) - Signed Added new Test order of Arterial Duplex Upper Extremity (Arterial Duplex Up ) - Signed Added new Referral order of CT Scan  (CT Scan) - Signed

## 2010-02-26 NOTE — Assessment & Plan Note (Signed)
Summary: rov 2 months///kp   Visit Type:  Follow-up Copy to:  [facility] Primary Provider/Referring Provider:  [name], MD  CC:  Pt here for follow up. Pt states breathing has "been good".  Pt has machine with him today.Marland Kitchen  History of Present Illness: 59/M, ex smoker for management of COPD & obstructive sleep apnea. He reports dyspnea on exertion but states he can walk for a mile or so on level ground now that his heart rhythm has been restored post ablation for A fibn by Dr Rayann Heman. He desaturates from 96 to 89 % on walking but surprisingly his FEV1 is relatively maintained at 76% on spirometry .Spirometry suggests mild airway obstruction & mild restriction likey due to obesity. CXR 10/09/08 showed hyperinflation & 1/13 /11 suggested some interstitial prominence. A 6 mm nodule noted on CT in 10/08 was not seen on CT angio in  8/10 performed for chest pain to r/o aortic dissection. Nuclear stress test did not show cardiac ischemia & showed nml LVEF. He was placed on home O2 afer adnmission for AF ablation when a satn was documented as 83% on RA, but this was dc'd in april 2011 . Baseline PSG in 11/08 showed severe obstructive sleep apnea with AHI 97/h, nadir desatn 76% corrected by CPAP 12 cm with large full face mask. He is disabled since 2005 , used to be a doT driver. download 2/23- 05/15/09 >> good compliance on 12 cm, leak +, residual AHI ok   July 14, 2009 1:44 PM  c/o excessive daytime somnolence  - mask ok, pressure ok, reports complaince with CPAP   Current Medications (verified): 1)  Clonidine Hcl 0.2 Mg Tabs (Clonidine Hcl) .... Take One Tablet By Mouth Two Times A Day 2)  Protonix 40 Mg Tbec (Pantoprazole Sodium) .Marland Kitchen.. 1 By Mouth Two Times A Day 3)  Lisinopril 40 Mg Tabs (Lisinopril) .... Take One Tablet By Mouth Daily 4)  Lipitor 40 Mg Tabs (Atorvastatin Calcium) .... Take One Tablet By Mouth Daily. 5)  Fenofibrate 160 Mg Tabs (Fenofibrate) .... Take One Tablet By  Mouth Daily With A Meal 6)  Tamsulosin Hcl 0.4 Mg Caps (Tamsulosin Hcl) .Marland Kitchen.. 1 By Mouth Once Daily 7)  Warfarin Sodium 7.5 Mg Tabs (Warfarin Sodium) .... Use As Directed By Anticoagulation Clinic 8)  Stool Softener 100 Mg Caps (Docusate Sodium) .Marland Kitchen.. 1 By Mouth As Needed 9)  Hydrocodone-Acetaminophen 5-500 Mg Tabs (Hydrocodone-Acetaminophen) .Marland Kitchen.. 1 or 2 Every 6 As Needed 10)  Combivent 103-18 Mcg/act Aero (Ipratropium-Albuterol) .... As Needed 11)  Nitrostat 0.4 Mg Subl (Nitroglycerin) .... As Directed 12)  Aspirin 81 Mg Tbec (Aspirin) .... Take One Tablet By Mouth Daily 13)  Cpap 12 Cm .... [facility] [phone] 14)  Amiodarone Hcl 200 Mg Tabs (Amiodarone Hcl) .... Take 1 Tablet By Mouth Once A Day 15)  Metoprolol Tartrate 50 Mg Tabs (Metoprolol Tartrate) .... Take 1 Tablet By Mouth Two Times A Day  Allergies (verified): 1)  ! Penicillin  Past History:  Past Medical History: Last updated: 06/25/2009 Paroxysmal  s/p PVI 2/11 Atrial flutter s/p atypical AVNRT ablation of the slow pathway GERD (ICD-530.81) NEPHROLITHIASIS (ICD-592.0) DYSLIPIDEMIA (ICD-272.4) HYPERTENSION (ICD-401.9) CAD s/p PCI mid LAD (Vision stent)  08/31/2004 OSA noncompliant with CPAP  Social History: Last updated: 05/15/2009 Resides in [place] with his wife. He has two children and  two grandchildren, He attends [employer].  Prior to his  retirement, he was a [employer] Administrator. He has not smoked since  March 2006;  prior to this, he smoked three packs per day for 38  years. He denies any  alcohol, drugs, herbal medications.  Review of Systems  The patient denies anorexia, fever, weight loss, weight gain, vision loss, decreased hearing, hoarseness, chest pain, syncope, dyspnea on exertion, peripheral edema, prolonged cough, headaches, hemoptysis, abdominal pain, melena, hematochezia, severe indigestion/heartburn, hematuria, muscle weakness, suspicious skin lesions, difficulty walking,  depression, unusual weight change, and abnormal bleeding.    Vital Signs:  Patient profile:   60 year old male Height:      72 inches Weight:      289 pounds BMI:     39.34 O2 Sat:      95 % on Room air Temp:     97.5 degrees F oral Pulse rate:   54 / minute BP sitting:   118 / 74  (left arm) Cuff size:   large  Vitals Entered By: Iran Planas CMA (July 14, 2009 1:37 PM)  O2 Flow:  Room air CC: Pt here for follow up. Pt states breathing has "been good".  Pt has machine with him today. Comments Medications reviewed with patient Verified contact number and pharmacy with patient Iran Planas CMA  July 14, 2009 1:37 PM    Physical Exam  Additional Exam:  Gen. Pleasant, well-nourished, in no distress, normal affect ENT - no lesions, no post nasal drip,class 2 airway Neck: No JVD, no thyromegaly, no carotid bruits Lungs: no use of accessory muscles, no dullness to percussion, clear without rales or rhonchi  Cardiovascular: Rhythm regular, heart sounds  normal, no murmurs or gallops, no peripheral edema Musculoskeletal: No deformities, no cyanosis or clubbing      Impression & Recommendations:  Problem # 1:  OBSTRUCTIVE SLEEP APNEA (ICD-780.57) Trial of nasal prongs - if this works, you can alternate with full face mask Compliance encouraged, wt loss emphasized, asked to avoid meds with sedative side effects, cautioned against driving when sleepy.  Orders: Est. Patient Level III (33354) DME Referral (DME)  Problem # 2:  C O P D (ICD-496) preserved FEv1  Take lasix every other day x 2 weeks , then ask dr Rayann Heman  - weigh yourself every 3 days  Medications Added to Medication List This Visit: 1)  Cpap 12 Cm  .... [facility] [phone] 2)  Amiodarone Hcl 200 Mg Tabs (Amiodarone hcl) .... Take 1 tablet by mouth once a day 3)  Metoprolol Tartrate 50 Mg Tabs (Metoprolol tartrate) .... Take 1 tablet by mouth two times a day  Patient Instructions: 1)  Copy  sent to: 2)  Please schedule a follow-up appointment in 6 months. 3)  Trial of nasal prongs - if this works, you can alternate with full face mask 4)  Take lasix every other day x 2 weeks , then ask dr Rayann Heman  - weigh yourself every 3 days   Immunization History:  Influenza Immunization History:    Influenza:  historical (10/28/2008)

## 2010-02-26 NOTE — Letter (Signed)
Summary: CMN for Oxygen/[facility]  CMN for Oxygen/[facility]   Imported By: Phillis Knack 07/07/2009 13:20:48  _____________________________________________________________________  External Attachment:    Type:   Image     Comment:   External Document

## 2010-02-26 NOTE — Progress Notes (Signed)
Summary: pt has questions about meds  Phone Note Call from Patient Call back at Home Phone [phone]   Caller: Patient Reason for Call: Talk to Nurse, Talk to Doctor Summary of Call: pt has a question about metoprolol 20m Rx if he is gonna be on it until his next visit he needs more pills called in and Pharm  is waiting to bill him until they hear from [place] he will have a lower co-pay[facility] [place] [phone] call taken by: LShelda Pal  June 30, 2009 8:07 AM  Follow-up for Phone Call        spoke with pt and [facility] and it should have been #60 if he is taking med two times a day.  Called pharmacy and let them know he should have had 60 pills the will get him the extra 3[place] RN, BSN  June 30, 2009 9:45 AM

## 2010-02-26 NOTE — Letter (Signed)
Summary: ELectrophysiology/Ablation Procedure Instructions  [name] [name]  [address] [place]   [place], [place] [address]   Phone: [phone]  Fax: [phone]     Electrophysiology/Ablation Procedure Instructions    You are scheduled for a(n) a-fib ablation on 02/25/09 at 7:30am with Dr. Rayann Heman  1.  Please come to the [place] at [facility] at 6:00am on the day of your procedure.  2.  Come prepared to stay overnight.   Please bring your insurance cards and a list of your medications.  3.  Come to the [place] office on 02/19/09 for lab work.  The lab at [facility] is open from 8:30 AM to 1:30 PM and 2:30 PM to 5:00 PM.   You do not have to be fasting.  4.  Do not have anything to eat or drink after midnight the night before your procedure.  5.   All of your remaining medications may be taken with a small amount of water.  6.  Educational material received:  _____ EP   _____ Ablation   * Occasionally, EP studies and ablations can become lengthy.  Please make your family aware of this before your procedure starts.  Average time ranges from 2-8 hours for EP studies/ablations.  Your physician will locate your family after the procedure with the results.  * If you have any questions after you get home, please call the office at [phone].  [name]  TEE schduled for 02/24/09 at 11:00.  Check in at [place] 9:00am  Do not eat or drink after midnight the night before your procedure.  Take your medications when you return home from TEE.

## 2010-02-26 NOTE — Miscellaneous (Signed)
Summary: Orders Update  Clinical Lists Changes  Orders: Added new Referral order of Cardiac CTA (Cardiac CTA) - Signed

## 2010-02-26 NOTE — Progress Notes (Signed)
  Phone Note Call from Patient Call back at Home Phone [phone]   Reason for Call: Talk to Nurse Summary of Call: pt had ablation on 02-25-09 by Dr. Rayann Heman.  He states at 3:00pm his BP was 154/107 and pulse 49.  He says he feels ok and has taken medication as usual but is concerned of the above rates.  Please call and advise pt. Initial call taken by: Glenna Chriscoe/[facility] [place]

## 2010-02-26 NOTE — Consult Note (Signed)
Summary: [place] By: Sallee Provencal 11/19/2009 12:34:54  _____________________________________________________________________  External Attachment:    Type:   Image     Comment:   External Document

## 2010-02-26 NOTE — Assessment & Plan Note (Signed)
Summary: 3 month/dmiller   Visit Type:  Follow-up Referring Provider:  Winn Jock Primary Provider:  [name], MD   History of Present Illness: The patient presents today for electrophysiology followup. He reports doing very well since his recent afib ablation.   He reports brief palpitations initially following ablation but denies any significant afib.  The patient denies symptoms of  chest pain,  SOB, lower extremity edema, dizziness, presyncope, syncope, or neurologic sequela. The patient is tolerating medications without difficulties and is otherwise without complaint today.   Current Medications (verified): 1)  Clonidine Hcl 0.2 Mg Tabs (Clonidine Hcl) .... Take One Tablet By Mouth Two Times A Day 2)  Protonix 40 Mg Tbec (Pantoprazole Sodium) .Marland Kitchen.. 1 By Mouth Two Times A Day 3)  Lisinopril 40 Mg Tabs (Lisinopril) .... Take One Tablet By Mouth Daily 4)  Lipitor 40 Mg Tabs (Atorvastatin Calcium) .... Take One Tablet By Mouth Daily. 5)  Fenofibrate 160 Mg Tabs (Fenofibrate) .... Take One Tablet By Mouth Daily With A Meal 6)  Tamsulosin Hcl 0.4 Mg Caps (Tamsulosin Hcl) .Marland Kitchen.. 1 By Mouth Once Daily 7)  Warfarin Sodium 7.5 Mg Tabs (Warfarin Sodium) .... Use As Directed By Anticoagulation Clinic 8)  Stool Softener 100 Mg Caps (Docusate Sodium) .Marland Kitchen.. 1 By Mouth As Needed 9)  Hydrocodone-Acetaminophen 5-500 Mg Tabs (Hydrocodone-Acetaminophen) .Marland Kitchen.. 1 or 2 Every 6 As Needed 10)  Combivent 103-18 Mcg/act Aero (Ipratropium-Albuterol) .... As Needed 11)  Nitrostat 0.4 Mg Subl (Nitroglycerin) .... As Directed 12)  Amiodarone Hcl 200 Mg Tabs (Amiodarone Hcl) .... Take 1/2 Tablet By Mouth Once Daily 13)  Aspirin 81 Mg Tbec (Aspirin) .... Take One Tablet By Mouth Daily 14)  Lasix 40 Mg Tabs (Furosemide) .... Once Daily For 5 Days 15)  Cpap 12 Cm  Allergies (verified): 1)  ! Penicillin  Past History:  Past Medical History: Reviewed history from 12/09/2008 and no changes  required. Paroxysmal afib Atrial flutter s/p atypical AVNRT ablation of the slow pathway GERD (ICD-530.81) NEPHROLITHIASIS (ICD-592.0) DYSLIPIDEMIA (ICD-272.4) HYPERTENSION (ICD-401.9) CAD s/p PCI mid LAD (Vision stent)  08/31/2004 OSA noncompliant with CPAP  Past Surgical History: Reviewed history from 03/19/2009 and no changes required.  History of cholecystectomy.  History of tonsillectomy.  Degenerative joint disease status post multiple diskectomies of the lumbar spine  s/p slow pathway ablation for atypical AVNRT 2007  negative EP study in [facility] 2008  PCI mid LAD (vision) 2006 s/p PVI/ CTI ablation 02/25/09  Social History: Reviewed history from 05/15/2009 and no changes required. Resides in [place] with his wife. He has two children and  two grandchildren, He attends [employer].  Prior to his  retirement, he was a [employer] Administrator. He has not smoked since March 2006;  prior to this, he smoked three packs per day for 38  years. He denies any  alcohol, drugs, herbal medications.  Review of Systems       All systems are reviewed and negative except as listed in the HPI.   Vital Signs:  Patient profile:   60 year old male Height:      72 inches Weight:      284 pounds BMI:     38.66 Pulse rate:   73 / minute BP sitting:   120 / 82  (left arm)  Vitals Entered By: Margaretmary Bayley CMA (Jun 16, 2009 9:20 AM)  Physical Exam  General:  Well developed, well nourished, in no acute distress. Head:  normocephalic and atraumatic  Eyes:  PERRLA/EOM intact; conjunctiva and lids normal. Mouth:  Teeth, gums and palate normal. Oral mucosa normal. Neck:  Neck supple, no JVD. No masses, thyromegaly or abnormal cervical nodes. Lungs:  Clear bilaterally to auscultation and percussion. Heart:  Non-displaced PMI, chest non-tender; regular rate and rhythm, S1, S2 without murmurs, rubs or gallops. Carotid upstroke normal, no bruit. Normal abdominal aortic size, no bruits.  Femorals normal pulses, no bruits. Pedals normal pulses. No edema, no varicosities. Abdomen:  Bowel sounds positive; abdomen soft and non-tender without masses, organomegaly, or hernias noted. No hepatosplenomegaly. Msk:  Back normal, normal gait. Muscle strength and tone normal. Pulses:  pulses normal in all 4 extremities Extremities:  No clubbing or cyanosis. Neurologic:  Alert and oriented x 3. Skin:  Intact without lesions or rashes. Cervical Nodes:  no significant adenopathy Psych:  Normal affect.   EKG  Procedure date:  06/16/2009  Findings:      siinus rhythm 60 bpm, PACs, otherwise normal ekg  Impression & Recommendations:  Problem # 1:  ATRIAL FIBRILLATION (ICD-427.31) Doing very well s/p ablation.  Continue coumadin. Stop amiodarone today.  Problem # 2:  OBSTRUCTIVE SLEEP APNEA (ICD-780.57) importance of CPAP longterm was reinforced today  Problem # 3:  HYPERTENSION (ICD-401.9)  above goal add chlorthalidone 44m daiy today bmet today  Orders: TLB-BMP (Basic Metabolic Panel-BMET) ([id number]  Patient Instructions: 1)  Your physician recommends that you schedule a follow-up appointment in: 3 months. 2)  Your physician recommends that you return for lab work today 3)  Your physician has recommended you make the following change in your medication: Stop Amiodarone. Start Chlorthalidone 29monce daily . Prescriptions: CHLORTHALIDONE 25 MG TABS (CHLORTHALIDONE) Take one tablet by mouth once daily.  #30 x 3   Entered by:   JeMargaretmary BayleyMA   Authorized by:   JaThompson GrayerMD   Signed by:   JeMargaretmary BayleyMA on 06/16/2009   Method used:   Electronically to        [phone](retail)       [address]     [place]  [id number]     Ph: [phone]     Fax: [phone] RxID:   [id number]

## 2010-02-26 NOTE — Progress Notes (Signed)
Summary: question re labwork  Phone Note Call from Patient   Caller: Patient Reason for Call: Talk to Nurse Summary of Call: pt having ablation done in sept-pre op ct  to be done 8-24 [facility] and thought he was to come here for labs after-is he to have labs at [facility] as well, or os he to come here? if so what labs and diagnosis? pls advise [phone] Initial call taken by: Lorenda Hatchet,  August 25, 2009 12:08 PM  Follow-up for Phone Call        lmom and went over instruction sheet Janan Halter, RN, BSN  August 25, 2009 1:30 PM

## 2010-03-05 ENCOUNTER — Telehealth: Payer: Self-pay | Admitting: Internal Medicine

## 2010-03-12 NOTE — Progress Notes (Signed)
Summary: refill meds  Phone Note Refill Request Call back at Home Phone [phone] Message from:  Patient on March 05, 2010 3:27 PM  Refills Requested: Medication #1:  METOPROLOL TARTRATE 50 MG TABS Take 1 tablet by mouth two times a day [facility].    Method Requested: Fax to [facility] Initial call taken by: Neil Crouch,  March 05, 2010 3:27 PM    Prescriptions: METOPROLOL TARTRATE 50 MG TABS (METOPROLOL TARTRATE) Take 1 tablet by mouth two times a day  #180 x 3   Entered by:   Burnett Kanaris   Authorized by:   Thompson Grayer, MD   Signed by:   Burnett Kanaris on 03/06/2010   Method used:   Faxed to ...       [facility] (mail-[facility])             , [place]         Ph: [phone]       Fax: [phone]   RxID:   [id number]

## 2010-04-09 LAB — PROTIME-INR
INR: 2.36 — ABNORMAL HIGH (ref 0.00–1.49)
Prothrombin Time: 25.9 seconds — ABNORMAL HIGH (ref 11.6–15.2)

## 2010-04-13 LAB — CBC
Hemoglobin: 13.9 g/dL (ref 13.0–17.0)
MCHC: 34.7 g/dL (ref 30.0–36.0)
MCV: 82.8 fL (ref 78.0–100.0)
Platelets: 140 10*3/uL — ABNORMAL LOW (ref 150–400)
RBC: 4.88 MIL/uL (ref 4.22–5.81)
RBC: 5.2 MIL/uL (ref 4.22–5.81)
RDW: 14.1 % (ref 11.5–15.5)
RDW: 14.4 % (ref 11.5–15.5)

## 2010-04-13 LAB — CARDIAC PANEL(CRET KIN+CKTOT+MB+TROPI)
CK, MB: 3.1 ng/mL (ref 0.3–4.0)
Relative Index: 1.3 (ref 0.0–2.5)
Relative Index: 1.5 (ref 0.0–2.5)
Relative Index: 1.7 (ref 0.0–2.5)
Total CK: 178 U/L (ref 7–232)
Troponin I: 0.02 ng/mL (ref 0.00–0.06)

## 2010-04-13 LAB — BASIC METABOLIC PANEL
CO2: 27 mEq/L (ref 19–32)
Calcium: 8.5 mg/dL (ref 8.4–10.5)
Calcium: 8.9 mg/dL (ref 8.4–10.5)
GFR calc Af Amer: >60 mL/min (ref >60)
GFR calc Af Amer: >60 mL/min (ref >60)
GFR calc non Af Amer: 48 mL/min — ABNORMAL LOW (ref >60)
GFR calc non Af Amer: 54 mL/min — ABNORMAL LOW (ref >60)
GFR calc non Af Amer: >60 mL/min (ref >60)
Potassium: 4.7 mEq/L (ref 3.5–5.1)
Sodium: 133 mEq/L — ABNORMAL LOW (ref 135–145)
Sodium: 133 mEq/L — ABNORMAL LOW (ref 135–145)
Sodium: 137 mEq/L (ref 135–145)

## 2010-04-13 LAB — COMPREHENSIVE METABOLIC PANEL
AST: 36 U/L (ref 0–37)
CO2: 25 mEq/L (ref 19–32)
Calcium: 8.9 mg/dL (ref 8.4–10.5)
Creatinine, Ser: 1.57 mg/dL — ABNORMAL HIGH (ref 0.4–1.5)
GFR calc Af Amer: 55 mL/min — ABNORMAL LOW (ref >60)
GFR calc non Af Amer: 46 mL/min — ABNORMAL LOW (ref >60)
Total Protein: 6.6 g/dL (ref 6.0–8.3)

## 2010-04-13 LAB — PROTIME-INR
INR: 1.34 (ref 0.00–1.49)
INR: 1.57 — ABNORMAL HIGH (ref 0.00–1.49)
Prothrombin Time: 18.6 seconds — ABNORMAL HIGH (ref 11.6–15.2)
Prothrombin Time: 21 seconds — ABNORMAL HIGH (ref 11.6–15.2)

## 2010-04-13 LAB — APTT: aPTT: 32 seconds (ref 24–37)

## 2010-04-15 LAB — MRSA PCR SCREENING: MRSA by PCR: NEGATIVE

## 2010-04-15 LAB — PROTIME-INR
Prothrombin Time: 30.8 seconds — ABNORMAL HIGH (ref 11.6–15.2)
Prothrombin Time: 31.4 seconds — ABNORMAL HIGH (ref 11.6–15.2)

## 2010-06-09 NOTE — Assessment & Plan Note (Signed)
[facility] OFFICE NOTE   Steve Anthony, Steve Anthony                       MRN:          [id number]  DATE:09/23/2008                            DOB:          09-17-1950    CHIEF COMPLAINT:  History of arrhythmia.   HISTORY OF PRESENT ILLNESS:  Steve Anthony is a 60 year old white male with  past medical history significant for coronary artery disease status post  PCI to the LAD with a bare-metal stent in August 2006 with a residual  50% stenosis upstream of PCI, atrial arrhythmias including an unusual AV  nodal re-entry tachycardia that was believed to be successfully ablated  in January 2007, questionable history of paroxysmal atrial fibrillation,  hypertension, COPD, diabetes, GERD, dyslipidemia, diverticulitis, who  was recently admitted to [facility] for treatment of acute  diverticulitis.  During this hospital stay, the patient was noted to  have several episodes of a narrow complex tachycardia with ventricular  rates between 150 and 170 beats per minute that did not sustain for  longer than 30 seconds each.  There are no strips currently available  for review.  The patient states that he had his initial ablation in 2007  which seemed to cure the chest tightness and palpitations he was  experiencing at that time.  Within a year, he had some recurrence of  these symptoms and underwent an additional EP study in [facility].  During that EP study, the rhythm could not be reproduced.  Therefore, no  intervention was undertaken at that time.  The patient states that over  the past year or so, he believes that his symptoms have been increasing  in frequency.  Specifically, the patient complains of some chest  tightness that occurs several times a week, sometimes at rest, and  sometimes with exertion, almost always associated with palpitations.  The patient states that there is also some associated dizziness but  denies any syncopal episodes.  Of note, the patient had a Lexiscan  Cardiolite study performed during his recent hospitalization which was  negative per the patient's medical record.  The patient states that he  thinks he may have had increased frequency of this chest tightness and  palpitations around the time of his hospitalization that may have been  associated with his acute diverticular illness.  The patient is not very  active at home although he is able to walk around grocery shopping and  at the mall without developing any predictable chest discomfort or  having significantly increased levels of dyspnea on exertion above his  baseline.  Lastly, the patient brings in a blood pressure monitoring log  with him today that shows several blood pressures in the 20B and 55H  systolic while others were in the 741-638 systolic range.  The patient  states that he sometimes feels slightly dizzy when his blood pressure is  low.  During these episodes, all of the documented heart rates were  under 100.   PAST MEDICAL HISTORY:  As above in the HPI.  In addition, the patient  has as diagnosis of obstructive sleep apnea  and uses CPAP intermittently  at home.   SOCIAL HISTORY:  The patient quit smoking approximately 4 years ago.  He  does not drink alcohol.  He comes today to the clinic with his daughter  who is a Marine scientist at the [employer] [facility].   FAMILY HISTORY:  Positive for arrhythmias and coronary disease.  However, it appears to be negative for premature coronary disease.   ALLERGIES:  PENICILLIN CAUSES A RASH.   MEDICATIONS:  Coumadin as directed by his INR for atrial fibrillation,  sotalol 120 mg b.i.d., Plavix 75 mg daily,  clonidine 0.2 mg b.i.d.,  Lipitor 40 mg daily, lisinopril 40 mg daily, fenofibrate 160 mg daily,  Flomax 0.4 mg nightly, Combivent p.r.n., stool softener.  The patient  recently completed a course of Cipro and Flagyl for diverticulitis.   REVIEW OF  SYSTEMS:  As in HPI, all systems were reviewed and are  negative.   PHYSICAL EXAMINATION:  VITAL SIGNS:  Blood pressure is 131/89, pulse 64,  satting 95% on room air.  He weighs 275 pounds.  GENERAL:  He is in no acute distress.  HEENT:  The patient has a papule on the pinna of his left ear that is  darkish in color and has a somewhat pearly-appearing surface, otherwise  benign.  NECK:  Supple.  There are no carotid bruits.  There is no JVD.  There is  no lymphadenopathy.  HEART:  Regular rate and rhythm without any murmurs, rubs, or gallops.  LUNGS:  Clear to auscultation bilaterally.  ABDOMEN:  Soft, nontender, nondistended.  EXTREMITIES:  There is no lower extremity edema.  SKIN:  Warm and dry.  PULSES:  The patient has 2+ bilateral carotid and radial pulses.  He has  2+ right-sided popliteal pulses and 1+ left-sided popliteal pulse.  He  has palpable dorsalis pedis pulses bilaterally.  NEURO:  Exam is nonfocal.  PSYCHIATRIC:  The patient is appropriate with normal levels of insight.   Review of the patient's labs from his recent hospitalization at [facility] shows a BMP that was completely within normal limits including  a creatinine of 0.99, troponins that were 0.01 x3.  CBC showed a white  count of 5, hemoglobin 14, hematocrit 41, and platelet count of 134,000.  His INR was 2.2.  His BNP was 158.  EKG from today independently  interpreted by myself demonstrates some baseline artifact but normal  sinus rhythm without any significant ST-T wave abnormalities.  His PR  interval was 144, his QRS was 84, and his QTC was 431.   ASSESSMENT:  A 60 year old white male with known coronary artery disease  and known history of atrioventricular nodal reentry tachycardia and  possible paroxysmal atrial fibrillation who presents with chest pain and  palpitations that are concerning for worsening of his arrhythmia.  The  patient had a recent negative stress test per the discharge  summary at  [facility] making obstructive coronary disease less likely of an  etiology for the chest discomfort.  It is also possible that the  patient's acute diverticular illness caused a recent increase in the  palpitations and arrhythmia he is experiencing.  However, the patient  seems to think that the frequent and intensity of his symptoms have been  increasing for quite some time prior to his acute illness.  Also, the  patient seems to be having some hypotensive episodes that may or may not  be associated with tachycardia.   PLAN:  We would like  to obtain the official stress report to document  the patient's current ejection fraction and negative stress test  results.  We will place the patient on a 30-day cardiac monitor in order  to evaluate the patient for worsening of any atrial arrhythmias he may  be experiencing.  If the patient does indeed have return of AVNRT, we  will refer the patient back to Dr. [name] as he is the  electrophysiologist who performed the patient's ablation back in 2007.  We will also ask the patient to decrease his clonidine dose from 0.2 mg  b.i.d. to 0.1 mg b.i.d. as he seems to be having some symptomatic  hypotensive episodes that are not associated with tachycardia.  The  patient will contact our office if his blood pressure becomes too high  once this dose is decreased or if he continues to have symptoms  symptomatic hypotension.  Otherwise, we will see the patient back in 6  week's time and discuss his treatment options with him.     Arlee Muslim, MD  Electronically Signed    SGA/MedQ  DD: 09/23/2008  DT: 09/23/2008  Job #: [id number]

## 2010-06-12 NOTE — H&P (Signed)
[facility]  Patient:    Steve Anthony, Steve Anthony                         MRN: [id number] Adm. Date:  04/15/00 Attending:  Johnn Hai, M.D. Dictator:   Johnn Hai, M.D.                         History and Physical  DATE OF BIRTH:  October 01, 1950  CHIEF COMPLAINT:  Bilateral leg pain.  HISTORY OF PRESENT ILLNESS:  The patient is a 60 year old male well known to Dr. Susa Day, having previously undergone a microdiskectomy back in October 2000.  He did quite well after his previous back surgery, up until about five months ago, when he had a gradual onset of recurrence of his low-back pain and bilateral leg pain.  His back pain is not as severe as before his previous surgery; however, he does have bilateral leg pain and radiculopathy, which is quite severe.  He has been treated conservatively due to the reoccurrence.  He has also undergone diagnostic workup.  The patient has undergone MRI, which did show some postsurgical changes at L5-S1 and a moderate-sized recurrent left-sided disk herniation at this level.  Due to the reoccurrence of his pain and the lack of improvement with conservative treatment, it is felt he would benefit from undergoing a redo microdiskectomy at the level of L5-S1 with the disk on the left.  Surgery will be performed by Dr. Susa Day.  Risks and benefits discussed and he has elected to proceed with surgery.  ALLERGIES:  PENICILLIN causes a rash and welts.  CURRENT MEDICATIONS: 1. Prilosec 20 mg daily. 2. Vioxx 25 mg daily. 3. Hydrocodone 5 mg 1 every 4-6 hours as needed for pain. 4. Robaxin 500 mg 1 every 6-8 hours p.r.n. spasm.  PAST MEDICAL HISTORY:  Peptic ulcer disease, gastroesophageal reflux disease, history of renal calculi.  PAST SURGICAL HISTORY:  Rhinoplasty in 1965.  He had right leg surgery secondary to fracture in 1990 with subsequent hardware removal.  He also underwent back surgery in October 2000.   He has also undergone kidney stone extraction in the fall of 2001.  SOCIAL HISTORY:  Positive tobacco history for approximately 30 years.  He has been up to three packs a day but he is currently a one-pack-per-day smoker. Denies use of alcohol products.  He is married and has one biological child and one stepchild.  FAMILY HISTORY:  Father deceased age 37 with history of heart disease, coronary artery disease, stroke, and diabetes.  Mother currently living, age 35.  She is in fairly good health with only back problems.  REVIEW OF SYSTEMS:  General:  No fevers, chills, or night sweats.  Neurologic: No seizures, syncope, or paralysis.  Respiratory:  No shortness of breath, productive cough, or hemoptysis.  Cardiovascular:  No chest pain, angina, or orthopnea.  GI:  No nausea, vomiting, diarrhea, or constipation.  GU:  No dysuria, hematuria, or discharge.  Hematology:  He has not had any bleeding problems or blood clots.  PHYSICAL EXAMINATION:  VITAL SIGNS:  Pulse 72, respirations 20, blood pressure 142/98.  GENERAL:  The patient is a 60 year old white male well-nourished, well-developed, appears to be in no acute distress.  He is alert, oriented, and cooperative at the time of exam.  He appears his stated age.  HEENT:  Normocephalic, atraumatic.  Pupils round and reactive.  Oropharynx  is clear.  NECK:  Supple.  No carotid bruits are appreciated.  CHEST:  Clear to auscultation and percussion.  No rhonchi or rales.  HEART:  Regular rate and rhythm.  Split S1, normal S2.  No rubs, thrills, or palpitations.  No murmurs.  ABDOMEN:  Soft, slightly round, slightly protuberant abdomen.  Bowel sounds are present.  No rebound or guarding.  RECTAL/BREASTS/GENITALIA:  Not done, not pertinent to present illness.  EXTREMITIES:  Significant to the left lower extremity.  He has good range of motion in the hips and knees.  Ambulates with a slightly antalgic gait. Positive straight leg raise  on the left with an S1 radiculopathy noted on exam.  He has decreased reflexes in the Achilles tendon on the left.  IMPRESSION: 1. Recurrent herniated nucleus pulposus on the left L5-S1. 2. Peptic ulcer disease. 3. Gastroesophageal reflux disease. 4. Renal calculi. 5. Status post back surgery October 2000.  PLAN:  The patient will be admitted to [facility] and undergo a redo microdiskectomy at L5-S1.  Surgery will be performed by Dr. Susa Day. DD:  04/11/00 TD:  04/12/00 Job: [id number]

## 2010-06-12 NOTE — Op Note (Signed)
   NAMEDIRCK, BUTCH                          ACCOUNT NO.:  [id number]   MEDICAL RECORD NO.:  [id number]                   PATIENT TYPE:  INP   LOCATION:  2550                                 FACILITY:  [facility]   PHYSICIAN:  Susa Day, M.D.                 DATE OF BIRTH:  [birth year]   DATE OF PROCEDURE:  10/03/2002  DATE OF DISCHARGE:                                 OPERATIVE REPORT   PREOPERATIVE DIAGNOSIS:  Recurrent disk herniation, spinal stenosis,  degenerative disk disease, and foraminal stenosis, L5-S1.   POSTOPERATIVE DIAGNOSIS:  Recurrent disk herniation, spinal stenosis,  degenerative disk disease, and foraminal stenosis, L5-S1.   PROCEDURE PERFORMED:  1. Redo lumbar decompression, L5-S1 left, with facetectomy and     foraminotomies at L5 and S1.  2. Transforaminal interbody fusion utilizing the Nuvasive interbody spacer,     local and DePuy Symphony bone graft.  3. Instrumentation, pedicle screw instrumentation, L5-S1, with lateral mass     and facet fusion, L5-S1, on the right.  4. Intraoperative neurologic monitoring, five hours.  5. Electromyographic pedicle screw monitoring.   Dictation ended at this point.                                               Susa Day, M.D.    Geralynn Rile  D:  10/03/2002  T:  10/04/2002  Job:  [id number]

## 2010-06-12 NOTE — Op Note (Signed)
NAMESANAV, REMER                ACCOUNT NO.:  [id number]   MEDICAL RECORD NO.:  [id number]          PATIENT TYPE:  OIB   LOCATION:  6524                         FACILITY:  [facility]   PHYSICIAN:  Champ Mungo. Lovena Le, M.D.  DATE OF BIRTH:  May 31, 1950   DATE OF PROCEDURE:  01/27/2005  DATE OF DISCHARGE:                                 OPERATIVE REPORT   PROCEDURE PERFORMED:  Electrophysiologic study and RF catheter ablation of  AV node reentry tachycardia.   INTRODUCTION:  The patient is a 60 year old male with a history of coronary  disease, tobacco abuse and recurrent SVT who was admitted to hospital with a  2-year history of recurrent SVT. He is now referred for electrophysiologic  study and catheter ablation. Of note, the patient's SVT has been present  despite beta blockers and associated with chest pressure, palpitations, and  syncope. He has had documented SVT at rates of over 190 beats per minute.   PROCEDURE:  After informed consent was obtained, the patient is taken to  diagnostic EP lab in fasting state. After usual preparation and draping,  intravenous fentanyl and midazolam was given for sedation. A 6-French  Hexapolar catheter was inserted percutaneously into the right jugular vein  and advanced to coronary sinus. A 5-French quadripolar catheter was inserted  percutaneously in the right femoral vein and advanced to the His bundle  region. A 5-French quadripolar catheter was inserted percutaneously in the  right femoral vein and advanced to the RV apex. After measurement basic  intervals, rapid ventricular pacing was carried out from the RV apex at  paced cycle length of 500 milliseconds and stepwise decreased down to 350  milliseconds where VA Wenckebach was observed. During rapid ventricular  pacemaker atrial activation was midline and decremental. Next programmed  ventricular stimulation was carried out from the RV apex at base drive cycle  length of 500 milliseconds. This  S1-S2 interval was stepwise decreased from  440 milliseconds down to 310 milliseconds where the retrograde AV node ERP  was observed. During programmed atrial stimulation the atrial activation was  midline and decremental. Of note, there were retrograde jumps and echoes  during programmed ventricular stimulation. Next programmed atrial  stimulation was carried out from the coronary sinus in the high right atrium  at base drive cycle length of 600 and 500 milliseconds as well as 4  milliseconds on Isoproterenol. This S1-S2 interval was stepwise decreased  down to 300 milliseconds where the AV node ERP was observed. During  Isoproterenol infusion programmed atrial stimulation was carried out at base  drive cycle length of 400 milliseconds and S1-S2 coupling interval 400/240  there was inducible SVT. Next rapid atrial pacing was carried out from the  coronary sinus at paced cycle length 600 milliseconds, stepwise decreased  down to 370 milliseconds where AV Wenckebach was observed. Additional  decrements down to 210 milliseconds did not induce tachycardia either with  or without Isoproterenol. At this point Isoproterenol was infused as  previously noted at a rate of 2 mcg per minute. Additional programmed atrial  stimulation was carried out at a  base drive cycle length of 400 milliseconds  resulting in the initiation of SVT. Mapping was carried out during SVT,  demonstrating a short RP tachycardia. However the RP was somewhat longer  than usual for usual AV node reentry. The atrial activation however was  midline and ventricular pacing during tachycardia demonstrated a VAV  conduction sequence strongly suggesting a diagnosis of AV node reentry  tachycardia versus a concealed retrograde-only conducting anteroseptal  accessory pathway. At this point the ablation catheter was maneuvered into  Koch's triangle and Isoproterenol was discontinued. A total of 11 RF energy  applications were delivered.  During RF energy application there was  prolonged accelerated junctional rhythm. Following this additional  Isoproterenol was reinfused and programmed atrial stimulation was again  carried out which demonstrated no inducible SVT. There were rare echo beats  but again no inducible SVT was noted. At this point the patient was observed  for 30 minutes and had no recurrent SVT and the catheter was removed.  Hemostasis assured, the patient was returned to his room in satisfactory  condition.   COMPLICATIONS:  There were no immediate procedure complications.   RESULTS:  A.  Baseline ECG. Baseline ECG demonstrates sinus rhythm with  normal axis and intervals.  B.  Baseline intervals. Sinus node cycle length was 993 milliseconds, the PR  interval 140, the QRS 170, ACVF interval was 56 milliseconds. AH interval  was 71 milliseconds.  C.  Rapid ventricular pacing. Rapid ventricular pacing was carried out from  the RV apex and stepwise decreased down to 350 milliseconds, demonstrating  VA Wenckebach. Rapid ventricular pacing, the atrial activation was midline  and decremental.  D.  Programmed ventricular stimulation. Programmed ventricular stimulation  was carried out from the RV apex at base drive cycle length 150  milliseconds. The S1-S2 interval stepwise decreased down to 310 milliseconds  where the retrograde AV node ERP was observed. During programmed ventricular  stimulation the atrial activation was midline and decremental. During  programmed ventricular stimulation there were multiple retrograde jumps and  echoes noted.  E.  Rapid atrial pacing. Rapid atrial pacing was carried out from the  coronary sinus and the high right atrium, paced cycle length of 600  milliseconds and stepwise decreased down to 370 milliseconds where AV  Wenckebach was observed. On Isoproterenol additional decrements carried out down to 250 milliseconds where AV Wenckebach was observed. During  Isoproterenol  infusion and without, there was no inducible SVT during rapid  atrial pacing.  F.  Programmed atrial stimulation. Programmed atrial stimulation was  carefully from coronary sinus and the high right atrium at base drive cycle  length of 600, 500, and 400 milliseconds. The S1-S2 interval was stepwise  decreased down to 300 milliseconds where the AV node ERP was observed.  During programmed atrial stimulation there were multiple AH jumps and echo  beats. On Isoproterenol there was inducible SVT. However, this was  nonsustained typically lasting 5-10 seconds.  G.  Arrhythmias observed.  1.  AV node reentry tachycardia. Initiation; programmed atrial stimulation      on Isoproterenol. Duration was sustained. Cycle length 280 milliseconds.      Method of termination was spontaneous.      1.  Mapping. Mapping of Koch's triangle demonstrated normal size and          orientation of Koch's triangle.          1.  RF energy application. A total of 11 brief RF energy  applications were delivered to sites 5 through 8 in Koch's              triangle. During RF energy application there was prolonged              accelerated junctional rhythm.   CONCLUSIONS:  This study demonstrates successful electrophysiologic study  and catheter ablation of unusual AV node reentry tachycardia with total of  11 RF energy applications delivered to sites 5 through 8 in Koch's triangle  rendering the tachycardia not inducible.           ______________________________  Champ Mungo. Lovena Le, M.D.     GWT/MEDQ  D:  01/27/2005  T:  01/28/2005  Job:  [id number]   cc:   Deboraha Sprang, M.D.  [address]  [address]  [place]  [place] [address]   [name], MD  Fax: [place]. Revankar, M.D.  Fax: [phone]

## 2010-06-12 NOTE — Consult Note (Signed)
NAMEJOHNNIE, Steve Anthony                ACCOUNT NO.:  [id number]   MEDICAL RECORD NO.:  [id number]          PATIENT TYPE:  INP   LOCATION:  4709                         FACILITY:  [facility]   PHYSICIAN:  Deboraha Sprang, M.D.  DATE OF BIRTH:  1950-02-17   DATE OF CONSULTATION:  DATE OF DISCHARGE:  01/23/2005                                   CONSULTATION   REASON FOR CONSULTATION:  Thank you very much for asking [place] to see Dr. Brooke Pace in electrophysiological consultation for recurrent supraventricular  tachycardia.   HISTORY OF PRESENT ILLNESS:  Steve Anthony is a 60 year old gentleman with  known ischemic heart disease. He presented in the summer of 2006 with chest  discomfort and underwent a catheterization and received a stent to his LAD.  He now presented with an episode of syncope accompanied by chest pain.  Catheterization demonstrated some stenosis proximal to the stent that was  thought to be 50% to 70% and functional testing was recommended. Of more  significant note, however, the patient's syncope was also accompanied by  tachy-palpitations. The patient has had tachy-palpitations for the last  couple of years. These episodes have been abrupt in onset and offset,  typically lasting 15 to 30 minutes and occasionally they have lasted as long  as an hour an a half. They are accompanied by chest discomfort but no  shortness of breath, pre-syncope and on this occasion, as noted he awakened  from a syncopal episode with tachy-palpitations. While in the hospital, he  had recurrent episodes of tachycardia, which are initiated on one occasion  by what appears to be a PVC, although there is a defamation in a preceding T  wave, suggesting that it may be a PAC. There was then a deflection of the ST  segments, again that may be a PAC with subsequent development of  tachycardia. Notably, however, there is also an inversion of the T wave,  which could represent the P wave, which would then be  quite far removed from  the QRS at about 120 to 160 milliseconds. This seems to be the case, as  determination of the tachycardia is associated with a loss of this P wave.  Thus, this represents merely a long RP tachycardia.   PAST MEDICAL HISTORY:  1.  Hypertension.  2.  Dyslipidemia with increased triglycerides.  3.  Degenerative joint disease with multiple back surgeries.  4.  Nephrolithiasis.  5.  Gastroesophageal reflux disease.   PAST SURGICAL HISTORY:  1.  Back surgery.  2.  Status post cholecystectomy.  3.  Tonsillectomy and adenoidectomy.   SOCIAL HISTORY:  He lives in __________ with his wife. He has 2 children and  2 grandchildren. He currently is not working. He has not smoked since March.  He denies the use of alcohol or recreational drugs.   CURRENT MEDICATIONS:  Protonix 80 (was on Nexium), Lisinopril 40, Omega 3,  Plavix 75, aspirin, Lasix 20, metoprolol 100, Tricor 145, Lipitor 20.   ALLERGIES:  PENICILLIN.   PHYSICAL EXAMINATION:  GENERAL:  A middle aged Caucasian male who appeared  older than his stated age of 32.  VITAL SIGNS:  Blood pressure 150/90, pulse 66.  HEENT:  No xanthoma. The neck veins were flat. Carotids were brisk and full  bilaterally without bruits.  BACK:  Without kyphosis or scoliosis.  LUNGS:  Clear.  HEART:  Sounds were regular without murmurs or gallops.  ABDOMEN:  Soft with active bowel sounds.  EXTREMITIES:  Femoral pulses were 2+. Distal pulses were intact. There was  no clubbing, cyanosis, or edema.  NEUROLOGIC:  Examination was grossly normal.   LABORATORY DATA:  Electrocardiogram dated January 22, 2005 demonstrated  sinus rhythm at 72 with intervals of 0.13, 0.10, 0.40. There was no evidence  of ventricular pre-excitation.   Electrocardiogram of tachycardia is not available.   Rhythm strip with tachycardia are described up above in the HPI.   Labs are notable for a low HDL of 20, an elevated triglyceride of 536.  Hemogram  was normal. A B-met from [facility] was also normal, apart  from a glucose of 121. Hemoglobin A1C was not identified.   IMPRESSION:  1.  Recurrent supraventricular tachycardia - nearly long RP, probably      atypical AV nodal re-entry versus slow slope.  2.  Syncope with recurrent supraventricular tachycardia.  3.  Coronary artery disease.      1.  Status post left anterior descending PCI.      2.  Proximal stenosis.      3.  Normal left ventricular function.  4.  Hypertension.  5.  Dyslipidemia with a low HDL and a high triglyceride.  6.  Sexual dysfunction on Toprol.   PLAN:  I have discussed with Mr. Surgeon and his wife, treatment options for  his supraventricular tachycardia including ongoing beta blocker/calcium  blocker use. They would like to undertake definitive therapy. We have  reviewed catheter ablation with potential benefits as well as potential  risks including, but not limited to, death, heart attack, and stroke at 1 in  1,000 and iatrogenic heart block with chronic pacemaker implantation at 1 in  100. They understand these risks and would like to proceed. We will plan to:  1.  Decrease the Toprol to 50.  2.  Begin Cardizem at 120.  3.  Discharge to home today.  4.  Will arrange for scheduling of a catheter ablation procedure as early as      the schedule allows.  5.  Finally, he will need an outpatient Myoview study, which can be done in      [place].           ______________________________  Deboraha Sprang, M.D.     SCK/MEDQ  D:  01/23/2005  T:  01/24/2005  Job:  [id number]   cc:   [name], MD  Fax: [place]. Revankar, M.D.  Fax: [phone]

## 2010-06-12 NOTE — H&P (Signed)
[name], Steve Anthony                          ACCOUNT NO.:  [id number]   MEDICAL RECORD NO.:  [id number]                   PATIENT TYPE:  INP   LOCATION:  NA                                   FACILITY:  [facility]   PHYSICIAN:  Susa Day, M.D.                 DATE OF BIRTH:  10/08/1950   DATE OF ADMISSION:  10/03/2002  DATE OF DISCHARGE:                                HISTORY & PHYSICAL   CHIEF COMPLAINT:  Low back pain with left lower extremity radiculopathy.   HISTORY:  Mr. Steve Anthony is a 60 year old gentleman who has a previous history  of a work related injury.  The patient has undergone two previous  microdiskectomies at the L5-S1 level, the first one in 2001, the next one in  2002.  From these surgeries the patient did have a period of improved  symptoms, however, the past several years he has had a worsening of his  symptoms.  The patient has undergone selective nerve root block which gave  him fairly good relief, however, over a period of time and after working  full duties the patient noted a return in his low back pain as well as left  lower extremity pain.  Pain starts in the left buttock and radiates down the  left foot and ankle with numbness in the left great toe.  The patient states  he is unable to perform his activities of daily living at a normal capacity.  He describes his pain as very disabling in nature that has gradually gotten  worse over the past several months.   Physical examination reveals that the patient is in moderate distress.  Forward flexion to 20 degrees with increase in pain.  He extends only to  neutral position.  He has limited right and left lateral rotation.  Straight  leg raise on the left produces buttock, posterior thigh, and calf pain  exacerbated with dorsal augmentation maneuver.  EHL is 5/5 on the left.  The  patient now presents with progressive disabling mechanical back pain  secondary to progressive disk degeneration at L5-S1, status  post diskectomy  x2 with neural foraminal narrowing at L5 on the left secondary to disk space  collapse.  The patient has failed conservative treatment, selective nerve  root blocks, analgesics, as well as anti-inflammatories.  It is felt at this  point the patient would benefit from a lumbar interbody fusion L5-S1.  The  risks and benefits of the surgery were discussed with the patient, and he  wishes to proceed.   MEDICAL HISTORY:  1. Gastroesophageal reflux disease.  2. Hypertension.  3. Hypercholesterolemia.  4. Coronary artery disease.  5. Irregular heart beat.   CURRENT MEDICATIONS:  1. Celebrex q.d.  2. Vicodin p.r.n. pain.  3. Neurontin one p.o. t.i.d.  4. Nexium 40 mg one p.o. q.d.  5. Accupril 20 mg one p.o. q.d.  6. Digoxin  125 mcg one p.o. q.d.  7. HCTZ 12.5 mg one p.o. q.d.  8. Toprol XL 100 mg one p.o. q.d.  9. Tricor 160 mg one p.o. q.d.   PAST SURGICAL HISTORY:  1. Cholecystectomy.  2. Microdiskectomy x2.  3. Removal of lipoma, left breast.  4. Lithotripsy.  5. Kidney stones.   SOCIAL HISTORY:  The patient is married.  He is over a 30 pack year smoker.  He recently quit on July 12, 2002.  He denies any alcohol intake.  He lives  in a one story home.   FAMILY HISTORY:  Father deceased of coronary artery disease.   REVIEW OF SYSTEMS:  GENERAL:  The patient denies any fever, chills, night  sweats, or bleeding tendencies.  CNS:  No blurred or double vision, seizure,  headache, or paralysis.  RESPIRATORY:  No shortness of breath, productive  cough, or hemoptysis.  CARDIOVASCULAR:  No chest pain, angina, or orthopnea.  GENITOURINARY:  No dysuria, hematuria, or discharge.  GASTROINTESTINAL:  No  nausea, vomiting, diarrhea, constipation, melena, or bloody stools.  MUSCULOSKELETAL:  Pertinent to HPI.   PHYSICAL EXAMINATION:  GENERAL:  This is a well-developed, well-nourished 10-  year-old gentleman in mild to moderate distress.  HEENT:  Atraumatic,  normocephalic.  Pupils equal, round, reactive to light.  EOM's intact.  NECK:  Supple, no lymphadenopathy.  CHEST:  Clear to auscultation bilaterally.  BREASTS:  Not examined, not pertinent to HPI.  GENITOURINARY:  Not examined, not pertinent to HPI.  HEART:  Regular rate and rhythm without murmurs, rubs, or gallops.  ABDOMEN:  Soft, nontender, nondistended, bowel sounds x4.  SKIN:  No rashes or lesions are noted.  EXTREMITIES:  Straight leg raise on the left produces buttocks, posterior  thigh, and calf pain.  EHL is 5-/5 on the left.  The patient has extremely  painful range of motion.   IMPRESSION:  Degenerative disk disease with recurrent herniated nucleus  pulposis, L5-S1.   PLAN:  The patient will be admitted to [facility] to undergo a  decompression along with a TLIF/PLI with instrument rotation at L5-S1 by Dr.  Susa Day.      Rometta Emery, P.A.                   Susa Day, M.D.    CS/MEDQ  D:  09/27/2002  T:  09/28/2002  Job:  [id number]

## 2010-06-12 NOTE — H&P (Signed)
NAMEGIOVONNIE, TRETTEL                ACCOUNT NO.:  [id number]   MEDICAL RECORD NO.:  [id number]          PATIENT TYPE:  INP   LOCATION:  2864                         FACILITY:  [facility]   PHYSICIAN:  Ethelle Lyon, M.D. LHCDATE OF BIRTH:  1950-01-27   DATE OF ADMISSION:  08/31/2004  DATE OF DISCHARGE:                                HISTORY & PHYSICAL   PRIMARY CARE PHYSICIAN:  [name], MD/Joyce Patram, N.P.   CARDIOLOGIST:  Dr. Geraldo Pitter   PATIENT PROFILE:  A 60 year old white male with several year history of  intermittent chest discomfort who presents on transfer from [facility] for PCI  of the LAD.   PROBLEM LIST:  1.  Coronary artery disease.      1.  In 1999 cardiac catheterization revealing insignificant coronary          artery disease.      2.  August 31, 2004 cardiac catheterization at [facility] showing normal          left main, 95% lesion in the mid left anterior descending, minor          irregularities in the circumflex and right coronary artery, normal          left ventricular function.  2.  Hypertension.  3.  Tobacco abuse.  4.  Hyperlipidemia and hypertriglyceridemia.  5.  History of nephrolithiasis.  6.  History of cholecystectomy.  7.  History of tonsillectomy.  8.  Degenerative joint disease status post multiple diskectomies of the      lumbar spine.   HISTORY OF PRESENT ILLNESS:  A 60 year old married white male with  approximately five to six-year history of intermittent chest pain that has  been more frequent and severe over the past six to 12 months.  Symptoms  typically occur at rest or with exertion approximately two to three times  per week sometimes associated with nausea, radiating to the neck and jaw and  relieved with sublingual nitroglycerin at rest.  He quit smoking in March  2006 and thinks that things have significantly worsened since then.  He  underwent outpatient catheterization today with Dr. Geraldo Pitter revealing a 95%  lesion in his mid  LAD and was transferred to [facility] for further  evaluation and PCI.  He is currently pain-free.   ALLERGIES:  PENICILLIN.   HOME MEDICATIONS:  1.  Toprol XL 25 mg daily.  2.  Digoxin 0.125 mg daily.  3.  TriCor 145 mg daily.  4.  Nexium 40 mg daily.  5.  Hydrocodone p.r.n.  6.  BC powder p.r.n.   FAMILY HISTORY:  Mother is age 55, alive and well.  Father died at 60 of  CVA, diabetes mellitus, hypertension, MI.  He has one sister who is age 49,  alive and well.   SOCIAL HISTORY:  Lives in [place] with his wife.  He is currently  unemployed.  He smoked three packs a day roughly since the age of 13 and  quit in March of 2006.  He denied any alcohol or drugs.  He does not  exercise.   REVIEW  OF SYSTEMS:  Positive for chest pain, shortness of breath, dyspnea on  exertion, nausea.  All other systems reviewed and negative.   PHYSICAL EXAMINATION:  VITAL SIGNS:  His heart rate is 65, respirations 12.  He is afebrile.  His blood pressure 167/101, pulse ox 94% on 2 L.  GENERAL:  Pleasant white male in no acute distress.  Awake, alert, oriented  x3.  NECK:  Normal carotid upstrokes.  No bruits, JVD.  LUNGS:  Respirations regular, unlabored, clear to auscultation.  CARDIAC:  Regular S1, S2.  No S3, S4.  No murmurs.  ABDOMEN:  Round, soft, nontender, nondistended.  Bowel sounds present x4.  EXTREMITIES:  Warm, dry, pink.  No clubbing, cyanosis, edema.  Dorsalis  pedis, posterior tibial pulses 2+ and equal bilaterally.  The right groin  site has a sheath that is intact to the RFA without bleeding or hematoma.   His chest x-ray from April 23 showed no acute disease.  Has an EKG showing  sinus rhythm.   LABORATORIES:  Pre catheterization:  Hemoglobin 15.8, hematocrit 45.2, WBC  6.3, platelets 139.  Sodium 139, potassium 4.4, chloride 103, CO2 26, BUN  10, creatinine 1.1, glucose 100.  TSH 1.74.  PTT 25.4, INR 1.   ASSESSMENT/PLAN:  1.  Coronary artery disease.  He has a 95% lesion of  his mid left anterior      descending and is for PCI today.  Will add aspirin and Plavix.  Continue      beta blocker and TriCor.  2.  Hyperlipidemia/hypertriglyceridemia.  He is currently on TriCor at home      and is followed by Dr. Geraldo Pitter.  Consider adding Statin.  3.  Tobacco abuse.  Quit in March 2006.  Continued cessation advised.  4.  Hypertension.  Continue beta blocker.  Will also add HCTZ given his      marked hypertension currently.  5.  Questionable history of palpitations.  Patient is on digoxin daily for      what in his records says irregular heart beat.  Will continue his beta      blocker currently.       CRB/MEDQ  D:  08/31/2004  T:  08/31/2004  Job:  [id number]

## 2010-06-12 NOTE — Cardiovascular Report (Signed)
Steve Anthony, Steve Anthony                ACCOUNT NO.:  [id number]   MEDICAL RECORD NO.:  [id number]          PATIENT TYPE:  INP   LOCATION:  2864                         FACILITY:  [facility]   PHYSICIAN:  Ethelle Lyon, M.D. LHCDATE OF BIRTH:  [birth year]   DATE OF PROCEDURE:  08/31/2004  DATE OF DISCHARGE:                              CARDIAC CATHETERIZATION   PROCEDURE:  Bare metal stenting in mid LAD.   INDICATION:  Mr. Glace is a 60 year old gentleman with hypertension,  dyslipidemia, and recently ceased tobacco abuse, who presents with several  months of exertional angina.  He underwent diagnostic angiography at  [facility] this morning by Dr. Geraldo Pitter.  This demonstrated mild  aneurysmal dilation of the proximal LAD and a 90% stenosis of the mid LAD.  He was referred for percutaneous intervention.   PROCEDURAL TECHNIQUE:  Informed consent was obtained.  The patient arrived  with a sheath in the right femoral artery.  Under 1% Lidocaine local  anesthesia, the sheath was exchanged over a wire for a fresh 6-French  sheath.  A 6-French JLS-4 guide was advanced over a wire and engaged in the  ostium of the left main.  A Prowater wire was advanced to the distal LAD  without difficulty.  I attempted to directly stent the vessel using a 3.5 x  12 mm Vision stent.  However, I could not visualize the lesion clearly due  to occlusion by the stent.  Therefore, the stent was removed.  The lesion  was pre-dilated using a 2.5 x 9-mm Maverick at 8 atmospheres.  I was then  able to visualize clearly and deployed the Vision stent across the lesion.  With deployment, the balloon burst.  However, there was no evidence of  perforation or dissection.  The stent was then post-dilated using a 3.75 x 8-  mm Quantum for 3 inflations, each at 18 atmospheres, and covering the  entirety of the stent.  Final angiography demonstrated no residual stenosis  in the stented segment.  There remains approximately  50% residual stenosis  upstream from the stent.  Decision was made to leave this untreated, as to  treat it would require jailing a large septal and a large diagonal.  The  patient tolerated the procedure well and was transferred to the holding room  in stable condition.   COMPLICATIONS:  None.   IMPRESSION/RECOMMENDATION:  Successful bare metal stent placement in the mid  LAD.  Patient will be continued on aspirin indefinitely and Plavix for at  least 30 days.       WED/MEDQ  D:  08/31/2004  T:  08/31/2004  Job:  [id number]   cc:   Reita Cliche. Revankar, M.D.  [place]  [place] [address]  Fax: [phone]   [name], MD  [address]  [place] [address]  Fax: [phone]

## 2010-06-12 NOTE — Op Note (Signed)
[facility]  Patient:    Steve Anthony, Steve Anthony                       MRN: [id number] Proc. Date: 04/15/00 Adm. Date:  [id number] Disc. Date: [id number] Attending:  Tye Savoy                           Operative Report  PREOPERATIVE DIAGNOSES:  Recurrent herniated nucleus pulposus, L5-S1, lateral recessed stenosis, foraminal stenosis to L5 and S1.  POSTOPERATIVE DIAGNOSES:  Recurrent herniated nucleus pulposus, L5-S1, lateral recessed stenosis, foraminal stenosis to L5 and S1.  PROCEDURE:  Redo microdiskectomy of lateral recessed decompression L5-S1, foraminotomy of L5.  ANESTHESIA:  General.  ASSISTANT:  Dr. Gladstone Lighter.  BRIEF HISTORY:  A 60 year old with recurrent disk herniation, disk degeneration, no foraminal stenosis, S1, L5 radiculopathy. Operative intervention was indicated for decompression of the L5 and S1 nerve roots by redo microdiskectomy. The risks and benefits were discussed including bleeding, infection, injury to neurovascular structures, CSF leakage, epidural fibrosis, need for fusion in the future, etc.  TECHNIQUE:  The patient in the supine position after an adequate level of general anesthesia and 1 gm of Kefzol. The patient placed prone on the Andrews frame, all bony prominences well padded in the lumbar region, prepped and draped in the usual sterile fashion. A previous surgical scar was excised, incision was made. The subcutaneous tissue was dissected, electrocautery was utilized to achieve hemostasis. The dorsal lumbar fascia was identified and divided in line with the skin incision. The paraspinous muscles elevated from the lamina of 5 and S1. A McCullough retractor was placed, confirmatory radiograph obtained at the 5-1 interspace. The operating microscope was draped and brought in the surgical field. Epidural fibrosis mobilized from the lamina of 5 and S1 in the lateral recess with a combination of a serrated  curette, nerve hook and a Penfield Four. The foramen of S1 was first identified and the S1 nerve root was identified, found to be compressed, and was gently mobilized medially. A high speed bur was utilized to perform a medial hemifacetectomy followed by a 2 mm Kerrison enlarging the L5 foramen. The L5 foramen was found to be stenotic compressing the 5-1 root. The 5 root was spared at all times and reflected, ligamentum flavum was also removed. The disk of 5-1 was identified laterally, incised, copious portions of disk material was removed from the interspace. The S1 nerve root was also identified and the lateral aspect the thecal sac gently mobilized medially. A combination of a hockey stock probe and nerve hook was utilized to mobilize the large extruded fragment which had migrated caudad through the disk space of 5-1. This was then retrieved with micropituitary. The disk space was entered and disk material was then removed. The wound was copiously irrigated. Bipolar electrocautery was utilized to achieve hemostasis. Following foraminotomy of S1 and 5, a hockey stick probe placed in both those foramen and found to be widely patent. The wound was copiously irrigated, packed with thrombin soaked Gelfoam. This was then removed and inspected with no evidence of active bleeding, no evidence of CSF leakage. The S1 and 5 roots were widely free and mobilized medial and laterally at least a centimeter. Again the wound was copiously irrigated, thrombin soaked Gelfoam placed in the laminotomy defect. The McCullough retractor removed. The wound was copiously irrigated again, the paraspinous muscle was inspected with no evidence of active bleeding. The  dorsal lumbar fascia was reapproximated with #1 Vicryl interrupted figure-of-eight sutures. The subcutaneous reapproximated with 2-0 Vicryl simple sutures. The skin is reapproximated with staples and dressed sterilely. The patient placed supine on the  hospital bed, extubated, and transported to the recovery room in satisfactory condition.  The patient tolerated the procedure well with no complications. DD:  04/15/01 TD:  04/18/00 Job: [id number]

## 2010-06-12 NOTE — Discharge Summary (Signed)
Steve Anthony, Steve Anthony                ACCOUNT NO.:  [id number]   MEDICAL RECORD NO.:  [id number]          PATIENT TYPE:  INP   LOCATION:  6526                         FACILITY:  [facility]   PHYSICIAN:  Ethelle Lyon, M.D. LHCDATE OF BIRTH:  1950-04-20   DATE OF ADMISSION:  08/31/2004  DATE OF DISCHARGE:  09/01/2004                                 DISCHARGE SUMMARY   PRINCIPAL DIAGNOSIS:  Coronary artery disease.   OTHER DIAGNOSES:  1.  Hypertension.  2.  Remote tobacco abuse.  3.  Hyperlipidemia.  4.  Hypertriglyceridemia.  5.  History of nephrolithiasis.  6.  History of cholecystectomy.  7.  History of tonsillectomy.  8.  Degenerative joint disease status post multiple lumbar surgeries.   ALLERGIES:  PENICILLIN.   PROCEDURE:  PCI and stenting of the left anterior descending with bare metal  stent.   HISTORY OF PRESENT ILLNESS:  A 60 year old married white male with  approximately a 5 to 6 year history of intermittent chest pain that has been  more frequent and severe over the past 12 months. The symptoms typically  occur at rest or with exertion approximately 2 to 3 times per week,  sometimes associated with nausea, radiating to the neck and jaw, relieved  with sublingual nitroglycerin and rest. He quit smoking in March 2006 and  thinks things have significantly worsened since that time. He later went  outpatient catheterization by Dr. Geraldo Pitter in [place] revealing a 95% lesion  in the mid left anterior descending. On August 31, 2004 he was transferred to  [facility] for further evaluation.   Following [facility] lab, the  patient was pain free. He was taken into the lab and underwent successful  PCI and stenting of the mid left anterior descending with a 3.5 x 12 mm  multi-link Vision bare metal stent. He tolerated this procedure well and was  transferred to the floor post-procedurally. This morning, he has been  ambulating in  the hallway without any recurrent chest discomfort or  limitation. He is being discharged home today in satisfactory condition.   LABORATORY DATA:  On discharge hemoglobin 13.9, hematocrit 40.0. White blood  cell count 5.5. Platelets 144,000. MCV 82.8. Sodium 137, potassium 3.8,  chloride 102, CO2 28, BUN 7, creatinine 1.1, glucose 108. Total bilirubin  0.8. Alkaline phosphatase 41, AST 19, ALT 23. Cardiac enzymes are negative  x1. Calcium is 8.9. Hemoglobin A1C is pending.   DISPOSITION:  The patient is being discharged home today in good condition.   FOLLOW UP:  He is asked to followup with Dr. Bea Graff in 1 to 2 weeks and has  an appointment with Dr. Geraldo Pitter on September 10, 2004 at 2:45 p.m.   DISCHARGE MEDICATIONS:  1.  Aspirin 81 mg q.d.  2.  Plavix 75 mg q.d.  3.  Toprol XL 50 mg q.d.  4.  Tricor 145 mg q.d.  5.  Nexium 40 mg q.d.  6.  HCTZ 25 mg q.d.  7.  Nitroglycerin 0.4 mg sublingual p.r.n. chest pain.   OUTSTANDING LABORATORY STUDIES:  Hemoglobin A1C is pending.   Duration of discharge encounter 30 minutes.       CRB/MEDQ  D:  09/01/2004  T:  09/01/2004  Job:  [id number]   cc:   [name], MD  [address]  [place] [address]  Fax: [place]. Revankar, M.D.  [place]  [place] [address]  Fax: [phone]

## 2010-06-12 NOTE — Discharge Summary (Signed)
Steve Anthony, Steve Anthony                          ACCOUNT NO.:  [id number]   MEDICAL RECORD NO.:  [id number]                   PATIENT TYPE:  INP   LOCATION:  5039                                 FACILITY:  [facility]   PHYSICIAN:  Susa Day, M.D.                 DATE OF BIRTH:  1950/11/22   DATE OF ADMISSION:  10/03/2002  DATE OF DISCHARGE:  10/08/2002                                 DISCHARGE SUMMARY   ADMISSION DIAGNOSES:  1. Degenerative disk disease with recurrent herniated nucleus pulposus at L5-     S1.  2. Gastrointestinal reflux disease.  3. Hypertension.  4. Hypercholesterolemia.  5. Coronary artery disease.  6. Irregular heartbeat.   DISCHARGE DIAGNOSES:  1. Degenerative disk disease with recurrent herniated nucleus pulposus at L5-     S1, status post transverse lumbar interbody fusion and posterolateral     fusion with instrumentation at L5-S1.  2. Gastrointestinal reflux disease.  3. Hypertension.  4. Hypercholesterolemia.  5. Coronary artery disease.  6. Irregular heartbeat.   HISTORY:  Mr. Steve Anthony is a 60 year old gentleman who had a previous work-  related injury to his lumbar spine area.  He had undergone two previously  microdiskectomies at the L5-S1 level, the first in 2001 and the next in  2002.  The patient did have a small period of improvement, but over the past  several years was noted to have worsening of his symptoms.  He has undergone  selective nerve root block with some relief of symptoms.  Over a period of  time, he continued working full duties.  The patient had noted significant  return of his low back pain with pain down the left lower extremity.  The  radiated down the left buttock into the left foot and ankle with numbness in  the great toe.  The patient does have positive neurogenic signs on exam.  MRI does show progression of degenerative disk disease at L5-S1.  Due to the  fact that the patient has not benefited from conservative measures  and  describes this back pain as disabling, it was felt that he would benefit  from a lumbar interbody fusion.  The risks and benefits of the surgery were  discussed with the patient and he wished to proceed.   CONSULTS:  PT and OT.   SURGERIES:  The patient was taken to the OR on October 03, 2002, to undergo  transverse lumbar interbody fusion with posterolateral fusion at the L5-S1  level using pedicle screws and autograft bone.   LABORATORY DATA:  Preadmission CBC showed a white blood cell count of 6.2,  hemoglobin 15.9, and hematocrit 46.2.  Routine H&Hs were followed throughout  the hospital course.  The patient did have a drop in his hemoglobin to a  level of 12.6 and hematocrit 36.3, however, this had stabilized at the time  of discharge with the hemoglobin being 12.9  and hematocrit 37.5.  Routine  chemistries done preoperatively showed a slightly low sodium at 134 with  slightly elevated glucose at 104.  Routine chemistries were followed  throughout the hospital course.  The patient's sodium normalized at 135,  potassium 3.8, and glucose at 110 with a normal BUN and creatinine.  Blood  type was AB positive.  Preoperative EKG shows sinus bradycardia, otherwise  normal.  I do not see a preoperative chest x-ray in the chart.   HOSPITAL COURSE:  The patient was admitted and underwent the above-stated  procedure without difficulty.  He was placed on PC analgesics for pain  control.  The patient was transferred to the PACU and then to the orthopedic  floor to continue postoperative care.  Postoperatively the patient was doing  very well.  He did note low back and lower extremity pain as expected.  He  had a slightly elevated temperature of 100.2 degrees, however, denied chest  pain and shortness of breath.  Motor and neurovascular functions remained  intact. The Foley was discontinued on postoperative day #1.  PT and OT were  consulted for ambulation.  On postoperative day #2, the  patient noted  complete resolution of his left lower extremity pain, however, noted severe  right lower extremity pain with standing.  He was voiding without  difficulty, however, he had not passed blood.  He continued with a slightly  elevated temperature of 100.8 degrees.  The abdomen was soft with slight  distention.  It was not tender.  The patient was started on a regimen of  Reglan and Dulcolax.  Colace was changed to Peri-Colace and GoLYTELY was  added.  The patient was weaned from his PCA.  Neurontin was begun on a  graduated dose.  On postoperative day #3, the patient continued to advance  well.  He did have a bowel movement.  He was ambulating without difficulty.  His fever, however, resolved.  The hemoglobin had stabilized.  He continued  to have some lower extremity discomfort.  On postoperative day #5, the  patient continued to notice improvement in his symptoms.  He was voiding and  having bowel movements without difficulty.  Motor and neurovascular  functions remained intact.  The abdomen was soft and nontender.  The  incision was clean and dry without evidence of infection.  At this point, it  was felt that the patient could be discharged home with home health.   DISPOSITION:  The patient was discharged home with home health.   FOLLOWUP:  He is to follow up with Dr. Tonita Cong in seven days.  He is to call  the office for an appointment.   ACTIVITY:  He can walk as tolerated, however, he should avoid any bending,  stooping, twisting, or lifting.  No problem standing or sitting.   DISCHARGE MEDICATIONS:  Neurontin, Robaxin, and Percocet, as well as vitamin  C.   WOUND CARE:  He is to change his dressing on a daily basis.  Okay to shower.   CONDITION ON DISCHARGE:  Stable.   DIET:  As tolerated.      Rometta Emery, P.A.                   Susa Day, M.D.    CS/MEDQ  D:  12/06/2002  T:  12/07/2002  Job:  [id number]

## 2010-06-12 NOTE — Cardiovascular Report (Signed)
NAMERAMY, GRETH                ACCOUNT NO.:  [id number]   MEDICAL RECORD NO.:  [id number]          PATIENT TYPE:  INP   LOCATION:  4709                         FACILITY:  [facility]   PHYSICIAN:  Loretha Brasil. Lia Foyer, M.D. [facility] OF BIRTH:  Nov 27, 1950   DATE OF PROCEDURE:  01/22/2005  DATE OF DISCHARGE:  01/23/2005                              CARDIAC CATHETERIZATION   REFERRING PHYSICIAN:  Sunny Schlein R. Revankar, M.D.   INDICATIONS:  The patient is a 60 year old gentleman who presents with  tachycardia. He has had some chest pain. He is being evaluated by EP. He has  had previous stenting of the left anterior descending artery by Dr. Albertine Patricia.  The current study was done to assess coronary anatomy.   PROCEDURE:  1.  Left heart catheterization.  2.  Selective coronary arteriography.  3.  Selective left ventriculography.   DESCRIPTION OF PROCEDURE:  The procedure was performed from the right  femoral artery using 6-French catheters. The patient tolerated procedure  without complications. He was taken to the holding area in satisfactory  clinical condition.   HEMODYNAMIC DATA:  1.  Central a pressure 137/87, mean 107.  2.  Left ventricular pressure 136/17.  3.  No gradient on pullback across the aortic valve.   ANGIOGRAPHIC DATA:  1.  Ventriculography was performed in the RAO projection. Overall systolic      function appeared preserved. No definite wall motion abnormalities were      identified.  2.  The right coronary artery demonstrates a fair amount of ectasia. There      is diffuse luminal irregularity throughout but no evidence of high-grade      focal stenosis. There is mild aneurysmal dilatation of the distal      vessel.  3.  Left main coronary demonstrates some aneurysmal dilatation at the      bifurcation of the LAD and circumflex involving the proximal LAD.  4.  The circumflex courses posteriorly. There is about 30-40% narrowing just      after the trifurcation. Again,  this does not appear to be critical.  5.  The left anterior descending artery courses to the apex. After the      diagonal and septal, there is a focal area of about 50% narrowing      followed by a stent site which is widely patent. The distal LAD is      without critical narrowing. Importantly, when compared to the previous      study, this does not appear to be dramatically changed. There appeared      to be 50-70% narrowing at this site just after the diagonal and septal      on the previous angiographic study at the time of the stenting      procedure. I believe this area was avoided to avoid compromise of the      diagonal itself. The lesion potentially could be hemodynamically      significant but again does not appear to be changed from before.      Assessment may be helpful.   CONCLUSION:  1.  Well-preserved left ventricular function.  2.  Continued patency of the left anterior descending artery stent with a      50% stenosis proximal to the stent as noted on the previous study.  3.  Luminal irregularities of the circumflex right coronary as described      above.   DISPOSITION:  The patient will be seen by EP.      Loretha Brasil. Lia Foyer, M.D. [facility]  Electronically Signed     TDS/MEDQ  D:  01/22/2005  T:  01/23/2005  Job:  [id number]   cc:   CV Laboratory   Patient's medical record

## 2010-06-12 NOTE — Assessment & Plan Note (Signed)
[place]                                 ON-CALL NOTE   JONAVEN, HILGERS                       MRN:          [id number]  DATE:10/19/2008                            DOB:          1950/08/29    PRIMARY CARDIOLOGIST:  Arlee Muslim, MD   I was contacted by [facility] regarding some brief episodes of  nonsustained wide complex tachycardia along with some brief narrow  complex runs of tachycardia, but with no known symptoms from the  patient.  I contacted the patient and he informed me that he had no  change to his chronic symptoms, which are intermittent chest tightness  and tachy-palpitations, which are short lived.  He was not aware of any  significant difference today from any other day over the last several  weeks/months.  He has a followup appointment already scheduled with Dr.  Zenia Resides November 05, 2008 planned on returning as scheduled.  I informed  the patient that he did have some brief runs of wide and narrow complex  tachycardia, but that he has a known history of this and therefore if he  did not have any significant change to his symptoms that he could just  follow up with his regular scheduled appointment.  However, should he  have any significant change or worsening to his symptoms, he should have  a low threshold for presenting to the emergency department for further  evaluation.  The patient indicated he understood this.     Guss Bunde, [facility]     MS/MedQ  DD: 10/19/2008  DT: 10/19/2008  Job #: [id number]

## 2010-06-12 NOTE — H&P (Signed)
NAMECOURTENAY, Steve Anthony NO.:  [id number]   MEDICAL RECORD NO.:  [id number]          PATIENT TYPE:  INP   LOCATION:  1497                         FACILITY:  [facility]   PHYSICIAN:  Glori Bickers, M.D. LHCDATE OF BIRTH:  07/04/1950   DATE OF ADMISSION:  01/21/2005  DATE OF DISCHARGE:                                HISTORY & PHYSICAL   BRIEF HISTORY:  [name] is a 60 year old white male who was transferred  via [place] from [facility] Room to [facility]  secondary to chest discomfort. [name] states that since his stent  placement in August 2006 he has continued to have anterior chest squeezing  sensation unassociated with shortness of breath, nausea, vomiting,  diaphoresis or radiation. This might occur one time every two weeks and the  duration would last 5-20 minutes. Two episodes required him to take two  sublingual nitroglycerin which provided relief. He gave these episodes a 7  to 8 on a scale of 0/10.   However, today at approximately 8:45 while washing dishes, he suddenly  became very dizzy and noticed a skipping heart beat. He fell to the floor  but did not lose consciousness nor sustain any injuries. At that time, he  noticed an anterior chest, squeezing sensation radiating into his jaw with  some slight nausea. He denied any shortness of breath or diaphoresis. His  daughter who was in nursing school checked his blood pressure and found this  to be 183/102 and a pulse of 118. On talking to her on the phone, she stated  that she tried to listen to his heart beat with her stethoscope that it was  all over the place. He gave the discomfort a 9 on a scale of 0/10 and he  took two to three sublingual nitroglycerin at home. His daughter states that  after the nitroglycerin his blood pressure was reduced to the 026  systolically; however, his heart rate remained to at least in the 140. His  wife drove him to the emergency room. The  patient states all in all the  duration discomfort was slightly less than 2 hours. He feels that today's  episode was similar to his stent. Since transfer from [facility], he  has not had any further episodes.   ALLERGIES:  Include PENICILLIN.   MEDICATIONS PRIOR TO ADMISSION:  1.  Nexium 40 milligrams daily.  2.  Lisinopril 40 milligrams daily.  3.  Fish oil 1200 milligrams daily.  4.  Plavix 75 daily.  5.  Aspirin 81 daily.  6.  Lasix 20 milligrams b.i.d.  7.  Toprol XL 100 milligrams daily.  8.  Tricor 145 daily.  9.  Vitamin C 500 daily.  10. Nitroglycerin 0.4 p.r.n.  11. Vicodin 5/500 1 tablet q.4-6h. p.r.n.   PAST MEDICAL HISTORY:  Notable for chest discomfort for which he was  cathetered the [facility] on August 2006. This showed a 20% diagonal,  normal LV function and aneurysmal paroxysmal LAD and 90% mid-LAD. He  underwent bare-mental stenting to the mid-LAD; however, Dr. Albertine Patricia noted  there was  a residual 50% proximal LAD in August 2006. His history is also  notable for hypertension, hyperlipidemia, specifically increased  triglycerides. DJD with multiple back surgeries, nephrolithiasis, status  post cholecystectomy, status post T&A, status post right lower extremity  fracture.   SOCIAL HISTORY:  Resides in [place] with his wife. He has two children and  two grandchildren, no great-grandchildren. He is unemployed. 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 He has not smoked since March 2006;  prior to this, he smoked three packs per day for 45 years. He denies any  alcohol, drugs, herbal medications. He tries to maintain a low salt, fat,  cholesterol diet and he walks two miles every day and tries to do 60 sit-ups  per day.   FAMILY HISTORY:  His mother is alive and well at the age of 75. His father  is deceased at the age of 41 with a CVA. He had a history of hypertension,  diabetes, myocardial infarction. He has a 86 year old sister is  alive and  well.   REVIEW OF SYSTEMS:  Notable for daily headaches, one week of nosebleeds in  November 2006, dentures, slight pedal edema which resolves at night,  palpitations occurring one time a week associated with lightheadedness  lasting less than 10 minutes, nocturia, back arthralgias. No diarrhea, rare  bright red blood per rectum, GERD.   PHYSICAL EXAM:  GENERAL:  Well-nourished, well-developed obese white male in  no apparent distress.  VITAL SIGNS:  Blood pressure is 115/76, pulse 69 and regular, respirations  20. 94% sat on 2 liters.  HEENT:  Unremarkable except for dentures.  NECK:  Supple without thyromegaly, adenopathy, JVD or carotid bruits.  HEART:  PMI is not displaced. Regular rate and rhythm with a normal S1, S2.  I did not appreciate any S3-S4, murmurs, rubs, clicks or gallops. All pulses  are symmetrical and intact without abdominal or femoral bruits.  LUNGS:  Symmetrical excursion. Clear to auscultation although decreased  breath sounds. Do not appreciate any rales, rhonchi or wheezing.  SKIN:  Integument is intact.  ABDOMEN:  Obese. Bowel sounds present without organomegaly, masses or  tenderness.  EXTREMITIES:  No cyanosis, clubbing or edema. Femoral bruits were not  appreciated.  MUSCULOSKELETAL:  Unremarkable.  NEUROLOGICAL:  Intact.  RECTAL:  It is noted at [facility], rectal was done and noted to be  heme-negative.   At [facility], he received IV heparin, three sublingual  nitroglycerin, and a total of 15 milligrams of IV Lopressor. Chest x-ray  showed no active disease. EKG showed normal sinus rhythm, left axis  deviation, possible old inferior myocardial infarction, normal intervals,  nonspecific ST-T wave changes. It had essentially remained unchanged from  April 2006. H&H 16.0 and 45.8. Normal indices, platelets 193,000. WBC 6.8. Sodium 142, potassium 4.1, BUN 13, creatinine 1.1, glucose 121. Normal  LFT's. BNP was 25. PTT 25. PT  10.1. ER marker negative x1.   IMPRESSION:  1.  Unstable angina.  2.  Presyncope associated with palpitations.  3.  Hypertension.  4.  History per past medical history.   DISPOSITION:  Dr. Haroldine Laws reviewed the patient's history, spoke with and  examined the patient and agrees with the above. We will admit the patient to  [facility] to rule out myocardial infarction. We will continue his  IV heparin per pharmacy and his home medications. I have called for his old  medical records for review. He has been placed on the catheterization  schedule for January 22, 2005 to further evaluate his symptoms. At the time  of  discharge, consideration should be given to an event monitor, given his  continued palpitations and near syncope. We will begin a Statin. It was  noted that he was on a Statin at the time of discharge given his history of  coronary artery disease.      Sharyl Nimrod, P.A. [facility]      Glori Bickers, M.D. [facility]  Electronically Signed    EW/MEDQ  D:  01/21/2005  T:  01/21/2005  Job:  [id number]   cc:   [name], MD  Fax: [place]. Revankar, M.D.  Fax: [phone]

## 2010-06-12 NOTE — Discharge Summary (Signed)
Steve Anthony, Steve Anthony                ACCOUNT NO.:  [id number]   MEDICAL RECORD NO.:  [id number]          PATIENT TYPE:  OIB   LOCATION:  6524                         FACILITY:  [facility]   PHYSICIAN:  Champ Mungo. Lovena Le, M.D.  DATE OF BIRTH:  1950/05/10   DATE OF ADMISSION:  01/27/2005  DATE OF DISCHARGE:  01/28/2005                                 DISCHARGE SUMMARY   ALLERGIES:  He has an allergy to PENICILLIN.   PRINCIPAL DIAGNOSES:  1.  Syncope with recurrent supraventricular tachycardia.  2.  Status post electrophysiology study, radiofrequency catheter ablation,      January 27, 2005, with finding of unusual, nonsustained atrioventricular      nodal reentrant tachycardia.      1.  Successful low P wave modification -- recurrence non-inducible.   SECONDARY DIAGNOSES:  1.  Coronary artery disease.  2.  Left heart catheterization, January 22, 2005:      1.  Left ventricular function is preserved, no wall motion          abnormalities.      2.  Ectatic right coronary artery no high-grade focal stenosis.      3.  Left main aneurysmal dilatation at the bifurcation of the left          anterior descending and the left circumflex.      4.  Left circumflex -- 30% to 40% stenosis after the trifurcation.      5.  The left anterior descending had a 50% narrowing after the first          diagonal and after the septal, then a stent which is widely patent,          no dramatic changes from study/stenting, August 2006.  3.  Hypertension.  4.  Dyslipidemia.  5.  Multiple back surgeries.  6.  Status post cholecystectomy.  7.  Nephrolithiasis.  8.  Right lower extremity fracture.  9.  Gastroesophageal reflux disease.   PROCEDURE:  January 27, 2005, electrophysiology study and radiofrequency  catheter ablation of atrioventricular nodal reentry tachycardia, Dr. [name].   BRIEF HISTORY:  Mr. Steve Anthony is a 60 year old gentleman with known ischemic  heart disease.  He presented in August 2006  with chest discomfort.  He  underwent catheterization and received a stent to the LAD.  He presented on  January 22, 1958 with an episode of syncope accompanied by chest pain.  Catheterization at that time showed stenosis proximal to the stent that was  present during the study in August 2006.  The patient's syncope was also  accompanied by tachy-palpitation.  The patient has history of tachy-  palpitations for the last couple of years.  Episodes are abrupt in onset and  offset.  They last 15-30 minutes, sometimes as long as an hour and a half.  They are accompanied by chest discomfort, but no shortness of breath,  sometimes presyncope and on this occasion, he awakened from a syncopal  episode with tachy-palpitation.  In the hospital, he was noted to have  recurrence episodes of tachycardia which were initiated on 1  occasion by  what appears to be a PVC.  There are deflections of the ST segments; again  there may be a PAC with subsequent development of tachycardia.  There is  also inversion of the T wave which could represent a P wave; this possibly  represents a long RT tachycardia.  Treatment options for his symptomatic  supraventricular tachycardia include catheter ablation; the benefits and  risks have been explained to the patient and he wishes to proceed with this  electrophysiology study/radiofrequency catheter ablation; this is planned  the week after New Year's.   HOSPITAL COURSE:  The patient presented electively, January 27, 2005.  He  underwent electrophysiology study and successful radiofrequency catheter  ablation of an unusual nonsustained A-V nodal reentry tachycardia.  He has  had no recurrence of tachycardia in the 16 hours of observation while at  [facility] and his catheterization sites are healing nicely without  swelling.  There is only mild pain associated.  He has had no chest pain and  no shortness of breath.  His vital signs are stable and we will  discharge on  the morning of January 28, 2005.  The patient has followup with Dr. [name] at the end of this month, the end of January, and he will see Dr.  [name], March 10, 2005, at 11 o'clock in the morning.   LABORATORY STUDIES THIS ADMISSION:  The PTT was 27, the PT was 15.3, INR  1.0.  Complete blood count:  White cells 5.9, hemoglobin 14.7, hematocrit  42.4, platelets 143,000.  Serum electrolytes this admission:  Sodium 141,  potassium 4.1, chloride 106, bicarbonate 27, BUN is 14, creatinine 1.1,  glucose 90.   MEDICATIONS AT DISCHARGE:  1.  Enteric-coated aspirin 81 mg daily.  2.  Plavix 75 mg daily.  3.  Lipitor 20 mg daily at bedtime.  4.  Lasix 20 mg twice daily.  5.  Lisinopril 40 mg daily.  6.  Nexium 40 mg daily.  7.  TriCor 145 mg daily.  8.  Toprol-XL 50 mg daily.  9.  Cardizem 120 mg daily.  10. Nitroglycerin 0.4 mg one tablet under the tongue every 5 minutes x3      doses for chest pain.  11. Vicodin 5/500 one or two tabs every 4-6 hours for pain.  12. Continue vitamin C and fish oil tabs.   WOUND CARE:  The patient may shower.  He is to call [phone] if he  experiences pain, swelling or bleeding at the catheterization site.   ACTIVITY:  He is asked not to drive for the next 2 days and to lift nothing  heavier than 10 pounds for the next 2 weeks.   FOLLOWUP:  He is to continue following his low-sodium, low-cholesterol diet.      [name], P.A.    ______________________________  Champ Mungo. Lovena Le, M.D.    GM/MEDQ  D:  01/28/2005  T:  01/28/2005  Job:  [id number]   cc:   Reita Cliche. Revankar, M.D.  Fax: [phone]   [name], MD  Fax: [phone]

## 2010-06-12 NOTE — Op Note (Signed)
NAMEJAMAIR, CATO                          ACCOUNT NO.:  [id number]   MEDICAL RECORD NO.:  [id number]                   PATIENT TYPE:  INP   LOCATION:  5039                                 FACILITY:  [facility]   PHYSICIAN:  Susa Day, M.D.                 DATE OF BIRTH:  Sep 24, 1950   DATE OF PROCEDURE:  10/03/2002  DATE OF DISCHARGE:                                 OPERATIVE REPORT   PREOPERATIVE DIAGNOSIS:  Recurrent disk herniation, L5-S1 degenerative disk  disease and spinal stenosis, L5-S1.   POSTOPERATIVE DIAGNOSIS:  Recurrent disk herniation, L5-S1 degenerative disk  disease and spinal stenosis, L5-S1.   OPERATION PERFORMED:  1. Redo microdecompression and diskectomy at L5-S1.  2. Micro TLIF L5-S1.  3. Minimally invasive Pathfinder instrumentation at L5-S1.  4. Posterolateral fusion L5-S1 utilizing local autologous and Symphony bone     graft.  5. Continuous neuro monitoring for five hours.  6. Triggered EMG responses for pedicle screw placement.   SURGEON:  Susa Day, M.D.   ASSISTANT:  1. Jessy Oto, M.D.  2. Rometta Emery, P.A.   ANESTHESIA:  General.   INDICATIONS FOR PROCEDURE:  The patient is a 60 year old with progressive  disk degeneration L5-S1 end stage, neural foraminal stenosis, recurrent disk  herniation, refractory pain.  Operative intervention was indicated for  decompression at L5-S1 and fusion utilizing foraminal distraction by TLIF  instrumentation for stabilization and bone grafting for fusion and repeat  decompression at L5-S1 left.  Risks and benefits were discussed including  bleeding, infection, damage to neurovascular structures, CSF leakage,  epidural fibrosis, adjacent segment disease, hardware failure, etc.   DESCRIPTION OF PROCEDURE:  Patient supine position.  After adequate level  general anesthesia, 1g vancomycin, he was placed prone on the Wilson frame  in a flexed position.  All prominences well padded and soft  tissue well  padded.  He was also placed in compression stockings.  The lumbar region was  prepped and draped in the usual sterile fashion.  Under C-arm evaluation we  delineated the lateral border of the pedicles at 5 and 1 as well as the  superior border of the pedicles of 5 and 1 bilaterally the intersection of  which we utilized as our reference point a centimeter lateral to each.  We  made an incision through the skin.  We started first on the left where we  performed the TLIF.  We bluntly dissected to the facet, felt the facet and  utilized the Maxcess retractor under X-ray placed it over the 5-1 facet,  distracted it, brought the operating microscope into the field.  We  proceeded with a TLIF with an osteotome removing the inferior portion of the  facet of 5.  This was removed and then in piecemeal fashion with a 2 and 3  mm Kerrison to remove the superior articulating process of S1 to flush with  the  pedicle, both superiorly and medially decompressing the 5 and S1 nerve  roots.  There was a vascular ___________ that was cauterized and mobilized.  We gently mobilized the nerve root cephalad and entered the disk space and  confirming it with x-ray.  Hockey stick probe placed in the foramen of 5 and  S1 found to be widely patent following the facetectomy.  We sequentially  dilated the disk space to an 11 mm dilation.  With the patient in the flexed  position, we then proceeded curetting the end plates with multiple curets  meticulously curetting all the end plates, removing the end plates of the  remaining disk and large disk material was removed.  There was a subannular  disk herniation that was  removed as well.  After thorough diskectomy and  scraping of the end plates, we selected a 9 Peak cage to be placed  interbody, we felt the 11 would be too large in size.  We packed this with a  Symphony bone graft that had been prepared in the appropriate fashion mixed  with autologous bone  from his facetectomy.  Prior to this, we used a funnel  and put copious more portions into the disk space across the midline and  anteriorly and impacted that with a pituitary.  Next, we placed the cage,  impacted it to the midline under C-arm augmentation with excellent  distraction of the foramen.  Hockey stick probe placed  in the foramen at 5  and S1 found to be widely patent following this.  Examined under x-ray.  Tried to advance it further across the midline but were unable to do so.  Next, the wound was copiously irrigated and electrocautery was utilized to  achieve hemostasis.  Next we used a Jamshidi needle and under x-ray marked  the pedicles of 5 and 1 taking care not to injure the neurovascular  structures and then advanced a Jamshidi needle along the pedicle.  A  guidewire was placed through that and then sequentially dilated up over that  for the soft tissue.  We used EMG triggered responses and they were  satisfactory.  All greater than 20.  Next, we measured the length at 45 at 5  and 40 at S1.  We tapped over the guidewires.  These were in appropriate  version and after doing so we proceeded to the contralateral side, again a  blunt dissection down through the facet, transverse processes utilizing the  Pathfinder technique under x-ray.  We advanced the Jamshidi needle into the  pedicles, guidewire sequential dilation, utilized a stripper that we used to  split the muscle between the two, also curetted the transverse processes and  the facet posteriorly for the posterolateral fusion.  We tapped with  cannulated tap, inserted a 45 and a 40 screw at 5 and at S1, respectively  with excellent purchase and under x-ray found to be appropriate convergence  within the vertebral body.  Both were then tested by triggered EMG responses  and both greater than 70m.  Next, we measured the length between the two,  inserted a rod and secured it with caps.  These were then torqued  utilizing counter torque for fixation. We felt that we could press on the left given  that the cage was there and more to the left midline than to the right.  We  then moved back to the left side after we had placed bone graft beneath the  rod on the right utilizing a funnel  tube and packing on the transverse  processes and the facet.  Over to the left, we then advanced our screws over  the guidewire excellent purchase.  Connecting rod was placed. The caps were  placed and torqued after we compressed a bit over the cage on the left side.  Hockey stitch probe placed on the foramina of 5 and S1 found to be widely  patent following decompression.  Wound copiously irrigated, no evidence of  active bleeding.  Again, we tried triggered EMG responses and they were  appropriate for both pedicle screw placements both greater than 25.  Running  neural monitoring indicated occasional activity in the tibialis on the left,  nothing sustained or significant.  The operating microscope was utilized for  the TLIF.  Next, we copiously irrigated both wounds.  Thrombin soaked  Gelfoam was placed on the left in the area of the TLIF.  We copiously  irrigated the wounds under x-ray in the AP and lateral plane. There was  satisfactory placement of the instrumentation and of the cage and  distraction of the neural foramen.  I then repaired the fascia with #0  Vicryl interrupted figure-of-eight sutures.  Subcutaneous tissue was  reapproximated with 2-0 Vicryl simple sutures.  Skin was reapproximated with  staples. The wound was dressed sterilely.  He was placed supine on a  hospital bed, extubated without difficulty and transported to recovery in  satisfactory condition.   The patient tolerated the procedure well, without complication.  The  estimated blood loss was 174m.                                                JSusa Day M.D.    JGeralynn Rile D:  10/04/2002  T:  10/04/2002  Job:  [id number]

## 2010-06-12 NOTE — Discharge Summary (Signed)
NAMEKENNTH, VANBENSCHOTEN                ACCOUNT NO.:  [id number]   MEDICAL RECORD NO.:  [id number]          PATIENT TYPE:  INP   LOCATION:  1157                         FACILITY:  [facility]   PHYSICIAN:  Deboraha Sprang, M.D.  DATE OF BIRTH:  12/08/1950   DATE OF ADMISSION:  01/21/2005  DATE OF DISCHARGE:  01/23/2005                                 DISCHARGE SUMMARY   PRIMARY CARE PHYSICIAN:  [name], M.D.   PRIMARY CARDIOLOGIST:  Reita Cliche. Revankar, M.D., [place], [place].   PRINCIPAL DIAGNOSIS:  Supraventricular tachycardia with chest pain.   SECONDARY DIAGNOSES:  1.  Syncope.  2.  Coronary artery disease status post percutaneous coronary intervention      and stenting of the mid-left anterior descending.  3.  Hypertension.  4.  Hyperlipidemia.  5.  Hypertriglyceridemia.  6.  Status post multiple back surgeries.  7.  History of nephrolithiasis.  8.  Status post cholecystectomy.  9.  Status post tonsillectomy and adenoidectomy.  10. Status post right lower extremity fracture.  11. Remote tobacco abuse.   ALLERGIES:  PENICILLIN.   PROCEDURE:  Left heart cardiac catheterization.   HISTORY OF PRESENT ILLNESS:  The patient is a 60 year old male with prior  history of coronary artery disease and palpitations who experienced  palpitations and dizziness followed by a fall without syncope/loss of  consciousness on January 21, 2005.  His daughter checked his heart rate and  noted it to be in the 140s, and he was taken to the [facility] ER  approximately two hours later.  Symptoms were similar what he had prior to  stent placement.  Following evaluation at [facility], he was transferred to  [facility] for further evaluation.   HOSPITAL COURSE:  The patient ruled out for MI by cardiac markers and was  noticed on telemetry to experience supraventricular tachycardia associated  with chest pain and lightheadedness while hospitalized.  Secondary to his  chest pain, he  underwent left heart cardiac catheterization on January 22, 2005 revealing nonobstructive coronary disease with patent LAD stents.  He  was then evaluated by electrophysiology for SVT, and it was determined that  he likely would be an ablation candidate.  He is being discharged home today  and will be contacted by our office to schedule radiofrequency ablation with  Dr. Caryl Comes in the near future.  Diltiazem has been added to his daily  medication regimen, and as a result, his Toprol-XL dose was decreased from  100 to 50 mg daily.   DISCHARGE LABORATORY DATA:  Hemoglobin 14.2, hematocrit 40.8, WBC 4.5,  platelets 147.  Sodium 140, potassium 3.6, chloride 101, CO2 29, BUN 11,  creatinine 1.3, glucose 104, calcium 9.2.  Cardiac markers were negative x3.  Total cholesterol 149, triglycerides 536, HDL 20, LDL not able to be  calculated.   DISPOSITION:  The patient is being discharged home today in good condition.   FOLLOW UP:  He is asked to follow up with Dr. Bea Graff in three to four weeks,  with Dr. Geraldo Pitter in approximately two weeks, and he will receive a  call  from [facility] to schedule ablation procedure with Dr. Caryl Comes.   DISCHARGE MEDICATIONS:  1.  Aspirin 81 mg daily.  2.  Plavix 75 mg daily.  3.  Lipitor 20 mg q.h.s.  4.  Lasix 20 mg b.i.d.  5.  Nexium 40 mg daily.  6.  Toprol-XL 50 mg daily.  7.  Tricor 145 mg daily.  8.  Vitamin C 500 mg daily.  9.  Fish oil two tablets daily.  10. Nitroglycerin 0.4 mg sublingual p.r.n. chest pain.  11. Diltiazem ER 120 mg daily.   OUTSTANDING LAB STUDIES:  None.   DURATION OF DISCHARGE ENCOUNTER:  35 minutes, including physician time.      Rogelia Mire, NP    ______________________________  Deboraha Sprang, M.D.    CRB/MEDQ  D:  01/23/2005  T:  01/24/2005  Job:  [id number]   cc:   Reita Cliche. Revankar, M.D.  Fax: [phone]   [name], MD  Fax: [phone]   Deboraha Sprang, M.D.  [phone]  [place] [address]

## 2010-06-29 ENCOUNTER — Encounter: Payer: Self-pay | Admitting: Internal Medicine

## 2010-07-20 ENCOUNTER — Ambulatory Visit (INDEPENDENT_AMBULATORY_CARE_PROVIDER_SITE_OTHER): Payer: Medicare Other | Admitting: Internal Medicine

## 2010-07-20 ENCOUNTER — Ambulatory Visit (HOSPITAL_COMMUNITY)
Admission: RE | Admit: 2010-07-20 | Discharge: 2010-07-20 | Disposition: A | Discharge status: Home / Self Care | Payer: Medicare Other | Source: Ambulatory Visit | Attending: Internal Medicine | Admitting: Internal Medicine

## 2010-07-20 ENCOUNTER — Encounter: Payer: Self-pay | Admitting: Internal Medicine

## 2010-07-20 DIAGNOSIS — I4891 Unspecified atrial fibrillation: Secondary | ICD-10-CM

## 2010-07-20 DIAGNOSIS — J4489 Other specified chronic obstructive pulmonary disease: Secondary | ICD-10-CM | POA: Insufficient documentation

## 2010-07-20 DIAGNOSIS — J449 Chronic obstructive pulmonary disease, unspecified: Secondary | ICD-10-CM | POA: Insufficient documentation

## 2010-07-20 DIAGNOSIS — R0602 Shortness of breath: Secondary | ICD-10-CM | POA: Insufficient documentation

## 2010-07-20 DIAGNOSIS — I10 Essential (primary) hypertension: Secondary | ICD-10-CM

## 2010-07-20 DIAGNOSIS — I2510 Atherosclerotic heart disease of native coronary artery without angina pectoris: Secondary | ICD-10-CM

## 2010-07-20 LAB — BASIC METABOLIC PANEL
Chloride: 102 mEq/L (ref 96–112)
GFR: 72.82 mL/min (ref >60.00)
Potassium: 4.2 mEq/L (ref 3.5–5.1)
Sodium: 137 mEq/L (ref 135–145)

## 2010-07-20 LAB — HEPATIC FUNCTION PANEL
ALT: 37 U/L (ref 0–53)
Albumin: 4.4 g/dL (ref 3.5–5.2)
Total Protein: 6.7 g/dL (ref 6.0–8.3)

## 2010-07-20 LAB — TSH: TSH: 1.73 u[IU]/mL (ref 0.35–5.50)

## 2010-07-20 NOTE — Patient Instructions (Signed)
Your physician wants you to follow-up in: 6 months with Dr Vallery Ridge will receive a reminder letter in the mail two months in advance. If you don't receive a letter, please call our office to schedule the follow-up appointment.  Your physician recommends that you return for lab work today  A chest x-ray takes a picture of the organs and structures inside the chest, including the heart, lungs, and blood vessels. This test can show several things, including, whether the heart is enlarges; whether fluid is building up in the lungs; and whether pacemaker / defibrillator leads are still in place.  Your physician has recommended you make the following change in your medication :decrease your  Amiodarone to every other day

## 2010-07-20 NOTE — Assessment & Plan Note (Signed)
Stable without ischemic symptoms No change required today

## 2010-07-20 NOTE — Assessment & Plan Note (Addendum)
Stable No change required today  bmet today

## 2010-07-20 NOTE — Progress Notes (Signed)
The patient presents today for routine electrophysiology followup.  Since last being seen in our clinic, the patient reports doing reasonably well.  He is unaware of any further afib.  He was hospitalized 1/12 for pancreatitis and 2/12 for bronchitis.  He has done well since that time.  Today, he denies symptoms of palpitations, chest pain, shortness of breath, orthopnea, PND, lower extremity edema, dizziness, presyncope, syncope, or neurologic sequela.  The patient feels that he is tolerating medications without difficulties and is otherwise without complaint today.   Past Medical History  Diagnosis Date  . Paroxysmal a-fib     S/P PVI 2/11 and 9/11  . SVT (supraventricular tachycardia)     Atypical AVNRT of the slow pathway  . GERD (gastroesophageal reflux disease)   . Nephrolithiasis   . Dyslipidemia   . Hypertension   . CAD (coronary artery disease)     S/P PCI mid LAD (vision stent) 08/31/2004, repeat cath 6/11 - no progression of CAD  . OSA (obstructive sleep apnea)     Non complient with CPAP   Past Surgical History  Procedure Date  . Cholecystectomy   . Tonsillectomy   . Lumbar disc surgery     DJD S/P multiple discetomies of the lumbar spine  . Atrial ablation surgery 2007    S/P slow pathway ablation for atypical AVNRT   . Ep study 2008    Negative EP study in [place]  . Pci mid lad 2006    Vision  . Pvi/ cti ablation 02/25/2009 and 09/2009    S/P afib/ CTI ablation    Current Outpatient Prescriptions  Medication Sig Dispense Refill  . albuterol-ipratropium (COMBIVENT) 18-103 MCG/ACT inhaler Inhale 2 puffs into the lungs as needed.        Marland Kitchen amiodarone (PACERONE) 200 MG tablet Take 100 mg by mouth daily.        Marland Kitchen amLODipine (NORVASC) 5 MG tablet Take 5 mg by mouth daily.        Marland Kitchen atorvastatin (LIPITOR) 40 MG tablet Take 40 mg by mouth daily.        . cloNIDine (CATAPRES) 0.2 MG tablet Take 0.2 mg by mouth 2 (two) times daily.        Marland Kitchen docusate sodium (COLACE) 100 MG  capsule Take 100 mg by mouth as needed.        . fenofibrate 160 MG tablet Take 160 mg by mouth daily.        Marland Kitchen HYDROcodone-acetaminophen (VICODIN) 5-500 MG per tablet Take 1 tablet by mouth every 6 (six) hours as needed.        Marland Kitchen lisinopril (PRINIVIL,ZESTRIL) 40 MG tablet Take 40 mg by mouth daily.        . metoprolol (TOPROL-XL) 50 MG 24 hr tablet Take 50 mg by mouth 2 (two) times daily.        . nitroGLYCERIN (NITROSTAT) 0.4 MG SL tablet Place 0.4 mg under the tongue every 5 (five) minutes as needed. May repeat up to 3 doses.       . Tamsulosin HCl (FLOMAX) 0.4 MG CAPS Take 0.4 mg by mouth daily.        Marland Kitchen warfarin (COUMADIN) 7.5 MG tablet Take 7.5 mg by mouth as directed.        Marland Kitchen DISCONTD: dabigatran (PRADAXA) 150 MG CAPS Take 150 mg by mouth 2 (two) times daily.        Marland Kitchen DISCONTD: pantoprazole (PROTONIX) 40 MG tablet Take 40 mg by mouth daily.  Allergies  Allergen Reactions  . Penicillins     REACTION: rash, welps    History   Social History  . Marital Status: Married    Spouse Name: N/A    Number of Children: N/A  . Years of Education: N/A   Occupational History  . DISABLED   . 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 Smoking status: Former Smoker -- 3.0 packs/day for 38 years    Quit date: 03/25/2004  . Smokeless tobacco: Not on file  . Alcohol Use: No  . Drug Use: No  . Sexually Active: Not on file   Other Topics Concern  . Not on file   Social History Narrative   Resides in [place] with his wifeTwo children and two grandchildrenAttends [place]    Family History  Problem Relation Age of Onset  . Emphysema Mother   . Heart attack Father     CVA  . Hypertension Father   . Diabetes Father   . Heart disease Father     MI  . Emphysema Maternal Grandfather     Smoker  . Heart disease Paternal Grandfather     ROS-  All systems are reviewed and are negative except as outlined in the HPI above    Physical  Exam: Filed Vitals:   07/20/10 0951  BP: 100/75  Pulse: 59  Resp: 14  Height: 6' (1.829 m)  Weight: 284 lb (128.822 kg)    GEN- The patient is well appearing, alert and oriented x 3 today.   Head- normocephalic, atraumatic Eyes-  Sclera clear, conjunctiva pink Ears- hearing intact Oropharynx- clear Neck- supple, no JVP Lymph- no cervical lymphadenopathy Lungs- Clear to ausculation bilaterally, normal work of breathing Heart- Regular rate and rhythm, no murmurs, rubs or gallops, PMI not laterally displaced GI- soft, NT, ND, + BS Extremities- no clubbing, cyanosis, or edema MS- no significant deformity or atrophy Skin- no rash or lesion Psych- euthymic mood, full affect Neuro- strength and sensation are intact  ekg today reveals sinus bradycardia 58 bpm, otherwise normal ekg  Assessment and Plan:

## 2010-07-20 NOTE — Assessment & Plan Note (Signed)
>>  ASSESSMENT AND PLAN FOR ATRIAL FIBRILLATION (HCC) WRITTEN ON 07/20/2010 10:35 AM BY Hillis Range, MD  Doing well,  Maintaining sinus rhythm Decrease amiodarone to 100mg  QOD Continue coumadin/ toprol  Check LFTs/ TFTs/ CXR today Return in 6 months

## 2010-07-20 NOTE — Assessment & Plan Note (Addendum)
Doing well,  Maintaining sinus rhythm Decrease amiodarone to 147m QOD Continue coumadin/ toprol  Check LFTs/ TFTs/ CXR today Return in 6 months

## 2010-08-03 ENCOUNTER — Telehealth: Payer: Self-pay | Admitting: Internal Medicine

## 2010-08-03 NOTE — Telephone Encounter (Signed)
The patient is aware of his results.

## 2010-08-03 NOTE — Telephone Encounter (Signed)
Returning call back.

## 2010-09-30 ENCOUNTER — Telehealth: Payer: Self-pay | Admitting: Internal Medicine

## 2010-09-30 NOTE — Telephone Encounter (Signed)
Spoke with patient and his mom wants to keep MD Prophet Renwick 56/66/  60 years old  Appointment in Dec and both come the same time

## 2010-09-30 NOTE — Telephone Encounter (Signed)
PT HAS QUESTION FOR NURSE

## 2010-10-02 NOTE — Telephone Encounter (Signed)
Spoke with Mr Steve Anthony to call on Mon and schedule

## 2011-01-25 ENCOUNTER — Encounter: Payer: Self-pay | Admitting: Internal Medicine

## 2011-01-25 ENCOUNTER — Ambulatory Visit (INDEPENDENT_AMBULATORY_CARE_PROVIDER_SITE_OTHER): Payer: Medicare Other | Admitting: Internal Medicine

## 2011-01-25 VITALS — BP 136/90 | HR 69 | Ht 73.0 in | Wt 280.0 lb

## 2011-01-25 DIAGNOSIS — I4891 Unspecified atrial fibrillation: Secondary | ICD-10-CM

## 2011-01-25 NOTE — Assessment & Plan Note (Signed)
>>  ASSESSMENT AND PLAN FOR ATRIAL FIBRILLATION (HCC) WRITTEN ON 01/25/2011 10:42 AM BY Hillis Range, MD  Maintaining sinus rhythm off of AAD (stopped amoidarone in October) Continue coumadin  Return in 9 months

## 2011-01-25 NOTE — Assessment & Plan Note (Signed)
Maintaining sinus rhythm off of AAD (stopped amoidarone in October) Continue coumadin  Return in 9 months

## 2011-01-25 NOTE — Progress Notes (Signed)
PCP:  [name], MD  The patient presents today for routine electrophysiology followup.  Since last being seen in our clinic, the patient reports doing very well. He denies afib.  His SOB is stable.  He is using CPAP with O2 at night.  Today, he denies symptoms of palpitations, chest pain,  lower extremity edema, dizziness, presyncope, or syncope.   Past Medical History  Diagnosis Date  . Paroxysmal a-fib     S/P PVI 2/11 and 9/11  . SVT (supraventricular tachycardia)     Atypical AVNRT of the slow pathway  . GERD (gastroesophageal reflux disease)   . Nephrolithiasis   . Dyslipidemia   . Hypertension   . CAD (coronary artery disease)     S/P PCI mid LAD (vision stent) 08/31/2004, repeat cath 6/11 - no progression of CAD  . OSA (obstructive sleep apnea)     Non complient with CPAP   Past Surgical History  Procedure Date  . Cholecystectomy   . Tonsillectomy   . Lumbar disc surgery     DJD S/P multiple discetomies of the lumbar spine  . Atrial ablation surgery 2007    S/P slow pathway ablation for atypical AVNRT   . Ep study 2008    Negative EP study in [place]  . Pci mid lad 2006    Vision  . Pvi/ cti ablation 02/25/2009 and 09/2009    S/P afib/ CTI ablation    Current Outpatient Prescriptions  Medication Sig Dispense Refill  . albuterol-ipratropium (COMBIVENT) 18-103 MCG/ACT inhaler Inhale 2 puffs into the lungs as needed.        Marland Kitchen atorvastatin (LIPITOR) 40 MG tablet Take 40 mg by mouth daily.        . cloNIDine (CATAPRES) 0.2 MG tablet Take 0.2 mg by mouth 2 (two) times daily.        Marland Kitchen docusate sodium (COLACE) 100 MG capsule Take 100 mg by mouth as needed.        . fenofibrate 160 MG tablet Take 160 mg by mouth daily.        . furosemide (LASIX) 20 MG tablet Take 20 mg by mouth daily.        Marland Kitchen HYDROcodone-acetaminophen (VICODIN) 5-500 MG per tablet Take 1 tablet by mouth every 6 (six) hours as needed.        . metFORMIN (GLUCOPHAGE) 500 MG tablet Take 500 mg by mouth daily  with breakfast.        . metoprolol (TOPROL-XL) 50 MG 24 hr tablet Take 50 mg by mouth 2 (two) times daily.        . nitroGLYCERIN (NITROSTAT) 0.4 MG SL tablet Place 0.4 mg under the tongue every 5 (five) minutes as needed. May repeat up to 3 doses.       . pantoprazole (PROTONIX) 40 MG tablet Take 40 mg by mouth daily.        . Tamsulosin HCl (FLOMAX) 0.4 MG CAPS Take 0.4 mg by mouth daily.        Marland Kitchen warfarin (COUMADIN) 7.5 MG tablet Take 7.5 mg by mouth as directed.          Allergies  Allergen Reactions  . Penicillins     REACTION: rash, welps    History   Social History  . Marital Status: Married    Spouse Name: N/A    Number of Children: N/A  . Years of Education: N/A   Occupational History  . DISABLED   . Retired [employer] Administrator  Social History Main Topics  . Smoking status: Former Smoker -- 3.0 packs/day for 38 years    Quit date: 03/25/2004  . Smokeless tobacco: Not on file  . Alcohol Use: No  . Drug Use: No  . Sexually Active: Not on file   Other Topics Concern  . Not on file   Social History Narrative   Resides in [place] with his wifeTwo children and two grandchildrenAttends [place]    Family History  Problem Relation Age of Onset  . Emphysema Mother   . Heart attack Father     CVA  . Hypertension Father   . Diabetes Father   . Heart disease Father     MI  . Emphysema Maternal Grandfather     Smoker  . Heart disease Paternal Grandfather    Physical Exam: Filed Vitals:   01/25/11 1013  BP: 136/90  Pulse: 69  Height: 6' 1"  (1.854 m)  Weight: 280 lb (127.007 kg)    GEN- The patient is well appearing, alert and oriented x 3 today.   Head- normocephalic, atraumatic Eyes-  Sclera clear, conjunctiva pink Ears- hearing intact Oropharynx- clear Neck- supple, no JVP Lymph- no cervical lymphadenopathy Lungs- Clear to ausculation bilaterally, normal work of breathing Heart- Regular rate and rhythm, no murmurs, rubs or  gallops, PMI not laterally displaced GI- soft, NT, ND, + BS Extremities- no clubbing, cyanosis, or edema  ekg today reveals sinus rhythm 69 bpm, otherwise normal ekg  Assessment and Plan:

## 2011-01-25 NOTE — Patient Instructions (Signed)
Your physician wants you to follow-up in: 9 months with Dr Vallery Ridge will receive a reminder letter in the mail two months in advance. If you don't receive a letter, please call our office to schedule the follow-up appointment.

## 2011-04-12 ENCOUNTER — Other Ambulatory Visit: Payer: Self-pay

## 2011-04-12 MED ORDER — METOPROLOL SUCCINATE ER 50 MG PO TB24: 50.0000 mg | ORAL_TABLET | Freq: Two times a day (BID) | ORAL | Status: DC

## 2011-04-13 ENCOUNTER — Other Ambulatory Visit: Payer: Self-pay

## 2011-04-13 MED ORDER — METOPROLOL SUCCINATE ER 50 MG PO TB24: 50.0000 mg | ORAL_TABLET | Freq: Two times a day (BID) | ORAL | Status: DC

## 2011-04-13 NOTE — Telephone Encounter (Signed)
.   Requested Prescriptions   Signed Prescriptions Disp Refills  . metoprolol succinate (TOPROL-XL) 50 MG 24 hr tablet 60 tablet 7    Sig: Take 1 tablet (50 mg total) by mouth 2 (two) times daily.    Authorizing Provider: Thompson Grayer    Ordering User: Laurence Compton   Called to pharmacy; script was sent out e-file on 04/12/11.@nd  attempt.

## 2011-07-01 ENCOUNTER — Telehealth: Payer: Self-pay | Admitting: Internal Medicine

## 2011-07-01 NOTE — Telephone Encounter (Signed)
Dr. Rayann Heman states that he will be happy to see Mrs. Maxon.  However, please make pt aware that his first available new patient appt could be several weeks away and that if she needs to be seen sooner he would be happy to reccomend someone else.

## 2011-07-01 NOTE — Telephone Encounter (Signed)
Pt wants to know if his wife can be seen by Dr. Rayann Heman due to irregular heart rate and if not who can she see I will call referring office and get records and info

## 2011-08-05 ENCOUNTER — Telehealth: Payer: Self-pay | Admitting: Internal Medicine

## 2011-08-05 NOTE — Telephone Encounter (Signed)
Needs apt for 09/3011

## 2011-08-05 NOTE — Telephone Encounter (Signed)
New Problem:    Patient called wanting to speak with you about his medication.  Please call back.

## 2011-10-04 ENCOUNTER — Encounter: Payer: Self-pay | Admitting: Internal Medicine

## 2011-10-11 ENCOUNTER — Ambulatory Visit (INDEPENDENT_AMBULATORY_CARE_PROVIDER_SITE_OTHER): Payer: Medicare Other | Admitting: Internal Medicine

## 2011-10-11 ENCOUNTER — Encounter: Payer: Self-pay | Admitting: Internal Medicine

## 2011-10-11 VITALS — BP 144/92 | HR 71 | Ht 72.0 in | Wt 266.0 lb

## 2011-10-11 DIAGNOSIS — J449 Chronic obstructive pulmonary disease, unspecified: Secondary | ICD-10-CM

## 2011-10-11 DIAGNOSIS — I4891 Unspecified atrial fibrillation: Secondary | ICD-10-CM

## 2011-10-11 DIAGNOSIS — G4730 Sleep apnea, unspecified: Secondary | ICD-10-CM

## 2011-10-11 DIAGNOSIS — I10 Essential (primary) hypertension: Secondary | ICD-10-CM

## 2011-10-11 DIAGNOSIS — J4489 Other specified chronic obstructive pulmonary disease: Secondary | ICD-10-CM

## 2011-10-11 NOTE — Assessment & Plan Note (Signed)
Stable No change required today  

## 2011-10-11 NOTE — Assessment & Plan Note (Signed)
Doing well without recurrence of afib s/p ablation Continue coumadin  Will place an event monitor to evaluate palpitations and see if he is having any afib.

## 2011-10-11 NOTE — Patient Instructions (Signed)
Your physician recommends that you schedule a follow-up appointment in: 12 months Your physician has recommended that you wear an event monitor. Event monitors are medical devices that record the heart's electrical activity. Doctors most often [place] these monitors to diagnose arrhythmias. Arrhythmias are problems with the speed or rhythm of the heartbeat. The monitor is a small, portable device. You can wear one while you do your normal daily activities. This is usually used to diagnose what is causing palpitations/syncope (passing out).  You have been referred to pulmonology -- Dr Elsworth Soho for COPD

## 2011-10-11 NOTE — Assessment & Plan Note (Signed)
Compliance with CPAP encouraged He is instructed to follow-up with pulmonary.

## 2011-10-11 NOTE — Assessment & Plan Note (Signed)
>>  ASSESSMENT AND PLAN FOR ATRIAL FIBRILLATION (HCC) WRITTEN ON 10/11/2011  9:49 AM BY Hillis Range, MD  Doing well without recurrence of afib s/p ablation Continue coumadin  Will place an event monitor to evaluate palpitations and see if he is having any afib.

## 2011-10-11 NOTE — Progress Notes (Signed)
PCP:  [name], MD  The patient presents today for routine electrophysiology followup.  Since last being seen in our clinic, the patient reports doing very well. He has very short palpitations (< 1 minute) but no sustained arrhythmias.   His SOB is stable.  He has not been using CPAP recently and needs follow-up with Pulmonary.   Today, he denies symptoms of palpitations, chest pain,  lower extremity edema, dizziness, presyncope, or syncope.   Past Medical History  Diagnosis Date  . Paroxysmal a-fib     S/P PVI 2/11 and 9/11  . SVT (supraventricular tachycardia)     Atypical AVNRT of the slow pathway  . GERD (gastroesophageal reflux disease)   . Nephrolithiasis   . Dyslipidemia   . Hypertension   . CAD (coronary artery disease)     S/P PCI mid LAD (vision stent) 08/31/2004, repeat cath 6/11 - no progression of CAD  . OSA (obstructive sleep apnea)     Non complient with CPAP   Past Surgical History  Procedure Date  . Cholecystectomy   . Tonsillectomy   . Lumbar disc surgery     DJD S/P multiple discetomies of the lumbar spine  . Atrial ablation surgery 2007    S/P slow pathway ablation for atypical AVNRT   . Ep study 2008    Negative EP study in [place]  . Pci mid lad 2006    Vision  . Pvi/ cti ablation 02/25/2009 and 09/2009    S/P afib/ CTI ablation    Current Outpatient Prescriptions  Medication Sig Dispense Refill  . albuterol-ipratropium (COMBIVENT) 18-103 MCG/ACT inhaler Inhale 2 puffs into the lungs as needed.        Marland Kitchen atorvastatin (LIPITOR) 40 MG tablet Take 40 mg by mouth daily.        . cloNIDine (CATAPRES) 0.2 MG tablet Take 0.2 mg by mouth 2 (two) times daily.        Marland Kitchen docusate sodium (COLACE) 100 MG capsule Take 100 mg by mouth as needed.        . fenofibrate 160 MG tablet Take 160 mg by mouth daily.        . ferrous sulfate 325 (65 FE) MG EC tablet Take 325 mg by mouth daily with breakfast.      . furosemide (LASIX) 20 MG tablet Take 20 mg by mouth daily.         Marland Kitchen HYDROcodone-acetaminophen (VICODIN) 5-500 MG per tablet Take 1 tablet by mouth every 6 (six) hours as needed.        . metFORMIN (GLUCOPHAGE) 500 MG tablet Take 500 mg by mouth 2 (two) times daily with a meal.       . metoprolol succinate (TOPROL-XL) 50 MG 24 hr tablet Take 1 tablet (50 mg total) by mouth 2 (two) times daily.  60 tablet  7  . nitroGLYCERIN (NITROSTAT) 0.4 MG SL tablet Place 0.4 mg under the tongue every 5 (five) minutes as needed. May repeat up to 3 doses.       . pantoprazole (PROTONIX) 40 MG tablet Take 40 mg by mouth daily.        . Tamsulosin HCl (FLOMAX) 0.4 MG CAPS Take 0.4 mg by mouth daily.        Marland Kitchen warfarin (COUMADIN) 7.5 MG tablet Take 7.5 mg by mouth as directed.        . zolpidem (AMBIEN) 10 MG tablet Take 10 mg by mouth at bedtime as needed.  Allergies  Allergen Reactions  . Penicillins     REACTION: rash, welps    History   Social History  . Marital Status: Married    Spouse Name: N/A    Number of Children: N/A  . Years of Education: N/A   Occupational History  . DISABLED   . 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 Smoking status: Former Smoker -- 3.0 packs/day for 38 years    Quit date: 03/25/2004  . Smokeless tobacco: Not on file  . Alcohol Use: No  . Drug Use: No  . Sexually Active: Not on file   Other Topics Concern  . Not on file   Social History Narrative   Resides in [place] with his wifeTwo children and two grandchildrenAttends [place]    Family History  Problem Relation Age of Onset  . Emphysema Mother   . Heart attack Father     CVA  . Hypertension Father   . Diabetes Father   . Heart disease Father     MI  . Emphysema Maternal Grandfather     Smoker  . Heart disease Paternal Grandfather    Physical Exam: Filed Vitals:   10/11/11 0900  BP: 144/92  Pulse: 71  Height: 6' (1.829 m)  Weight: 266 lb (120.657 kg)  SpO2: 93%    GEN- The patient is well appearing,  alert and oriented x 3 today.   Head- normocephalic, atraumatic Eyes-  Sclera clear, conjunctiva pink Ears- hearing intact Oropharynx- clear Neck- supple, no JVP Lymph- no cervical lymphadenopathy Lungs- Clear to ausculation bilaterally, normal work of breathing Heart- Regular rate and rhythm, no murmurs, rubs or gallops, PMI not laterally displaced GI- soft, NT, ND, + BS Extremities- no clubbing, cyanosis, or edema  ekg today reveals sinus rhythm 71 bpm, otherwise normal ekg  Assessment and Plan:

## 2011-11-05 ENCOUNTER — Encounter: Payer: Self-pay | Admitting: Internal Medicine

## 2011-12-14 ENCOUNTER — Other Ambulatory Visit: Payer: Self-pay | Admitting: *Deleted

## 2011-12-14 MED ORDER — METOPROLOL SUCCINATE ER 50 MG PO TB24: 50.0000 mg | ORAL_TABLET | Freq: Two times a day (BID) | ORAL | Status: DC

## 2012-04-06 ENCOUNTER — Ambulatory Visit (INDEPENDENT_AMBULATORY_CARE_PROVIDER_SITE_OTHER): Payer: Medicare PPO

## 2012-04-06 ENCOUNTER — Other Ambulatory Visit: Payer: Self-pay | Admitting: *Deleted

## 2012-04-06 DIAGNOSIS — I4891 Unspecified atrial fibrillation: Secondary | ICD-10-CM

## 2012-04-06 DIAGNOSIS — R0989 Other specified symptoms and signs involving the circulatory and respiratory systems: Secondary | ICD-10-CM

## 2012-04-06 NOTE — Progress Notes (Signed)
Patient was in with his mother for her appointment today and discussed with Dr Rayann Heman his symptoms of increased palpitations.  Dr Rayann Heman has therefor ordered a monitor to assess his afib bureden

## 2012-04-07 NOTE — Progress Notes (Signed)
Enrolled patient to received monitor in mailed

## 2012-08-12 ENCOUNTER — Other Ambulatory Visit: Payer: Self-pay | Admitting: Internal Medicine

## 2012-08-14 ENCOUNTER — Other Ambulatory Visit: Payer: Self-pay

## 2012-08-14 MED ORDER — METOPROLOL SUCCINATE ER 50 MG PO TB24: 50.0000 mg | ORAL_TABLET | Freq: Two times a day (BID) | ORAL | Status: DC

## 2012-10-05 ENCOUNTER — Other Ambulatory Visit: Payer: Self-pay | Admitting: *Deleted

## 2012-10-05 MED ORDER — METOPROLOL SUCCINATE ER 50 MG PO TB24: 50.0000 mg | ORAL_TABLET | Freq: Two times a day (BID) | ORAL | Status: DC

## 2012-10-31 ENCOUNTER — Telehealth: Payer: Self-pay | Admitting: Internal Medicine

## 2012-10-31 NOTE — Telephone Encounter (Signed)
New message   Want to talk to a nurse about bp. Have appt  In dec with Dr Rayann Heman.. B/P's at home 182/110 pulse 65 and  149/99  And 148/103 today.  Talked to [place] and she suggested he call Dr Jackalyn Lombard nurse. What do you think.  No chest pain, sob, dizziness.

## 2012-10-31 NOTE — Telephone Encounter (Signed)
Spoke with patient and he has not been taking his Furosemide regularly due to it making him go to the BR.  He is going to start taking  Again and we will see how it goes.Marland Kitchen He is taking all his other medications.

## 2012-11-02 NOTE — Telephone Encounter (Signed)
BP better on fluid pill but had episode of chest tightness "felt like a band around his chest"  Had mild nausea but never got sick.  It lasted for 69mn and today he feels firn.  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  I have encouraged him if he has another episode to proceed to the ER

## 2012-11-08 ENCOUNTER — Telehealth: Payer: Self-pay | Admitting: Internal Medicine

## 2012-11-08 ENCOUNTER — Observation Stay (HOSPITAL_COMMUNITY)
Admission: EM | Admit: 2012-11-08 | Discharge: 2012-11-10 | Disposition: A | Discharge status: Home / Self Care | Payer: Medicare PPO | Attending: Cardiology | Admitting: Cardiology

## 2012-11-08 ENCOUNTER — Encounter (HOSPITAL_COMMUNITY): Payer: Self-pay | Admitting: Emergency Medicine

## 2012-11-08 DIAGNOSIS — R51 Headache: Secondary | ICD-10-CM | POA: Insufficient documentation

## 2012-11-08 DIAGNOSIS — R0989 Other specified symptoms and signs involving the circulatory and respiratory systems: Secondary | ICD-10-CM | POA: Insufficient documentation

## 2012-11-08 DIAGNOSIS — G4733 Obstructive sleep apnea (adult) (pediatric): Secondary | ICD-10-CM | POA: Insufficient documentation

## 2012-11-08 DIAGNOSIS — Z79899 Other long term (current) drug therapy: Secondary | ICD-10-CM | POA: Insufficient documentation

## 2012-11-08 DIAGNOSIS — M545 Low back pain, unspecified: Secondary | ICD-10-CM | POA: Insufficient documentation

## 2012-11-08 DIAGNOSIS — K219 Gastro-esophageal reflux disease without esophagitis: Secondary | ICD-10-CM | POA: Insufficient documentation

## 2012-11-08 DIAGNOSIS — R0609 Other forms of dyspnea: Secondary | ICD-10-CM | POA: Insufficient documentation

## 2012-11-08 DIAGNOSIS — I2510 Atherosclerotic heart disease of native coronary artery without angina pectoris: Secondary | ICD-10-CM

## 2012-11-08 DIAGNOSIS — N200 Calculus of kidney: Secondary | ICD-10-CM | POA: Insufficient documentation

## 2012-11-08 DIAGNOSIS — R109 Unspecified abdominal pain: Secondary | ICD-10-CM

## 2012-11-08 DIAGNOSIS — J4489 Other specified chronic obstructive pulmonary disease: Secondary | ICD-10-CM | POA: Insufficient documentation

## 2012-11-08 DIAGNOSIS — I200 Unstable angina: Secondary | ICD-10-CM

## 2012-11-08 DIAGNOSIS — E785 Hyperlipidemia, unspecified: Secondary | ICD-10-CM | POA: Insufficient documentation

## 2012-11-08 DIAGNOSIS — E119 Type 2 diabetes mellitus without complications: Secondary | ICD-10-CM | POA: Insufficient documentation

## 2012-11-08 DIAGNOSIS — R0602 Shortness of breath: Secondary | ICD-10-CM

## 2012-11-08 DIAGNOSIS — Z7901 Long term (current) use of anticoagulants: Secondary | ICD-10-CM | POA: Insufficient documentation

## 2012-11-08 DIAGNOSIS — I4819 Other persistent atrial fibrillation: Secondary | ICD-10-CM | POA: Diagnosis present

## 2012-11-08 DIAGNOSIS — R002 Palpitations: Secondary | ICD-10-CM | POA: Insufficient documentation

## 2012-11-08 DIAGNOSIS — E0590 Thyrotoxicosis, unspecified without thyrotoxic crisis or storm: Secondary | ICD-10-CM | POA: Insufficient documentation

## 2012-11-08 DIAGNOSIS — R079 Chest pain, unspecified: Secondary | ICD-10-CM | POA: Insufficient documentation

## 2012-11-08 DIAGNOSIS — M1990 Unspecified osteoarthritis, unspecified site: Secondary | ICD-10-CM | POA: Insufficient documentation

## 2012-11-08 DIAGNOSIS — I10 Essential (primary) hypertension: Secondary | ICD-10-CM | POA: Insufficient documentation

## 2012-11-08 DIAGNOSIS — Z9861 Coronary angioplasty status: Secondary | ICD-10-CM | POA: Insufficient documentation

## 2012-11-08 DIAGNOSIS — I498 Other specified cardiac arrhythmias: Secondary | ICD-10-CM | POA: Insufficient documentation

## 2012-11-08 DIAGNOSIS — J449 Chronic obstructive pulmonary disease, unspecified: Secondary | ICD-10-CM | POA: Insufficient documentation

## 2012-11-08 DIAGNOSIS — I4821 Permanent atrial fibrillation: Secondary | ICD-10-CM | POA: Diagnosis present

## 2012-11-08 DIAGNOSIS — G8929 Other chronic pain: Secondary | ICD-10-CM | POA: Insufficient documentation

## 2012-11-08 DIAGNOSIS — E876 Hypokalemia: Secondary | ICD-10-CM | POA: Insufficient documentation

## 2012-11-08 DIAGNOSIS — I4891 Unspecified atrial fibrillation: Principal | ICD-10-CM | POA: Insufficient documentation

## 2012-11-08 HISTORY — DX: Unspecified osteoarthritis, unspecified site: M19.90

## 2012-11-08 HISTORY — DX: Type 2 diabetes mellitus without complications: E11.9

## 2012-11-08 HISTORY — DX: Thyrotoxicosis, unspecified without thyrotoxic crisis or storm: E05.90

## 2012-11-08 HISTORY — DX: Chronic obstructive pulmonary disease, unspecified: J44.9

## 2012-11-08 HISTORY — DX: Dependence on supplemental oxygen: Z99.81

## 2012-11-08 HISTORY — DX: Palpitations: R00.2

## 2012-11-08 HISTORY — DX: Spondylosis without myelopathy or radiculopathy, lumbar region: M47.816

## 2012-11-08 LAB — PRO B NATRIURETIC PEPTIDE: Pro B Natriuretic peptide (BNP): 367.2 pg/mL — ABNORMAL HIGH (ref 0–125)

## 2012-11-08 LAB — CBC
HCT: 41.2 % (ref 39.0–52.0)
MCH: 29.6 pg (ref 26.0–34.0)
MCV: 83.6 fL (ref 78.0–100.0)
RBC: 4.93 MIL/uL (ref 4.22–5.81)
RDW: 13.6 % (ref 11.5–15.5)
WBC: 7.1 10*3/uL (ref 4.0–10.5)

## 2012-11-08 LAB — BASIC METABOLIC PANEL
BUN: 8 mg/dL (ref 6–23)
CO2: 26 mEq/L (ref 19–32)
Chloride: 100 mEq/L (ref 96–112)
Creatinine, Ser: 0.95 mg/dL (ref 0.50–1.35)

## 2012-11-08 LAB — POCT I-STAT TROPONIN I: Troponin i, poc: 0 ng/mL (ref 0.00–0.08)

## 2012-11-08 LAB — GLUCOSE, CAPILLARY: Glucose-Capillary: 148 mg/dL — ABNORMAL HIGH (ref 70–99)

## 2012-11-08 LAB — TROPONIN I: Troponin I: <0.3 ng/mL (ref <0.30)

## 2012-11-08 MED ORDER — ZOLPIDEM TARTRATE 5 MG PO TABS
10.0000 mg | ORAL_TABLET | Freq: Every day | ORAL | Status: DC
  Administered 2012-11-08 – 2012-11-09 (×2): 10 mg via ORAL
  Filled 2012-11-08 (×2): qty 2

## 2012-11-08 MED ORDER — METHIMAZOLE 5 MG PO TABS
2.5000 mg | ORAL_TABLET | Freq: Every day | ORAL | Status: DC
  Administered 2012-11-09 – 2012-11-10 (×2): 2.5 mg via ORAL
  Filled 2012-11-08 (×2): qty 1

## 2012-11-08 MED ORDER — ACETAMINOPHEN 325 MG PO TABS
650.0000 mg | ORAL_TABLET | ORAL | Status: DC | PRN
  Administered 2012-11-09 – 2012-11-10 (×3): 650 mg via ORAL
  Filled 2012-11-08 (×3): qty 2

## 2012-11-08 MED ORDER — ASPIRIN EC 81 MG PO TBEC
81.0000 mg | DELAYED_RELEASE_TABLET | Freq: Every day | ORAL | Status: DC
  Administered 2012-11-08 – 2012-11-10 (×3): 81 mg via ORAL
  Filled 2012-11-08 (×4): qty 1

## 2012-11-08 MED ORDER — WARFARIN SODIUM 7.5 MG PO TABS
7.5000 mg | ORAL_TABLET | Freq: Once | ORAL | Status: AC
  Administered 2012-11-08: 7.5 mg via ORAL
  Filled 2012-11-08: qty 1

## 2012-11-08 MED ORDER — INSULIN ASPART 100 UNIT/ML ~~LOC~~ SOLN: 0.0000 [IU] | Freq: Three times a day (TID) | SUBCUTANEOUS | Status: DC

## 2012-11-08 MED ORDER — NITROGLYCERIN 0.4 MG SL SUBL: 0.4000 mg | SUBLINGUAL_TABLET | SUBLINGUAL | Status: DC | PRN

## 2012-11-08 MED ORDER — FENOFIBRATE 160 MG PO TABS
160.0000 mg | ORAL_TABLET | Freq: Every day | ORAL | Status: DC
  Administered 2012-11-09 – 2012-11-10 (×2): 160 mg via ORAL
  Filled 2012-11-08 (×2): qty 1

## 2012-11-08 MED ORDER — ATORVASTATIN CALCIUM 40 MG PO TABS
40.0000 mg | ORAL_TABLET | Freq: Every day | ORAL | Status: DC
  Administered 2012-11-08 – 2012-11-10 (×3): 40 mg via ORAL
  Filled 2012-11-08 (×4): qty 1

## 2012-11-08 MED ORDER — CLONIDINE HCL 0.2 MG PO TABS
0.2000 mg | ORAL_TABLET | Freq: Three times a day (TID) | ORAL | Status: DC
  Administered 2012-11-08 – 2012-11-10 (×7): 0.2 mg via ORAL
  Filled 2012-11-08 (×7): qty 1

## 2012-11-08 MED ORDER — LOSARTAN POTASSIUM 50 MG PO TABS
50.0000 mg | ORAL_TABLET | Freq: Every day | ORAL | Status: DC
  Administered 2012-11-09 – 2012-11-10 (×2): 50 mg via ORAL
  Filled 2012-11-08 (×2): qty 1

## 2012-11-08 MED ORDER — PANTOPRAZOLE SODIUM 40 MG PO TBEC
40.0000 mg | DELAYED_RELEASE_TABLET | Freq: Every day | ORAL | Status: DC
  Administered 2012-11-09 – 2012-11-10 (×2): 40 mg via ORAL
  Filled 2012-11-08 (×2): qty 1

## 2012-11-08 MED ORDER — METOPROLOL SUCCINATE ER 50 MG PO TB24
50.0000 mg | ORAL_TABLET | Freq: Every day | ORAL | Status: DC
  Administered 2012-11-09 – 2012-11-10 (×2): 50 mg via ORAL
  Filled 2012-11-08 (×2): qty 1

## 2012-11-08 MED ORDER — AZITHROMYCIN 250 MG PO TABS
250.0000 mg | ORAL_TABLET | Freq: Every day | ORAL | Status: DC
  Administered 2012-11-09 – 2012-11-10 (×2): 250 mg via ORAL
  Filled 2012-11-08 (×2): qty 1

## 2012-11-08 MED ORDER — WARFARIN - PHARMACIST DOSING INPATIENT: Freq: Every day | Status: DC

## 2012-11-08 MED ORDER — FERROUS SULFATE 325 (65 FE) MG PO TABS
325.0000 mg | ORAL_TABLET | Freq: Every day | ORAL | Status: DC
  Administered 2012-11-09: 325 mg via ORAL
  Filled 2012-11-08 (×3): qty 1

## 2012-11-08 MED ORDER — HEPARIN BOLUS VIA INFUSION
4000.0000 [IU] | Freq: Once | INTRAVENOUS | Status: AC
  Administered 2012-11-08: 4000 [IU] via INTRAVENOUS
  Filled 2012-11-08: qty 4000

## 2012-11-08 MED ORDER — IPRATROPIUM-ALBUTEROL 18-103 MCG/ACT IN AERO: 2.0000 | INHALATION_SPRAY | RESPIRATORY_TRACT | Status: DC | PRN

## 2012-11-08 MED ORDER — HYDROCODONE-ACETAMINOPHEN 5-325 MG PO TABS
1.0000 | ORAL_TABLET | Freq: Four times a day (QID) | ORAL | Status: DC | PRN
  Administered 2012-11-08 – 2012-11-10 (×7): 1 via ORAL
  Filled 2012-11-08 (×7): qty 1

## 2012-11-08 MED ORDER — HEPARIN (PORCINE) IN NACL 100-0.45 UNIT/ML-% IJ SOLN
1800.0000 [IU]/h | INTRAMUSCULAR | Status: DC
  Administered 2012-11-08: 1400 [IU]/h via INTRAVENOUS
  Administered 2012-11-09: 1800 [IU]/h via INTRAVENOUS
  Filled 2012-11-08 (×2): qty 250

## 2012-11-08 MED ORDER — FERROUS SULFATE 325 (65 FE) MG PO TBEC: 325.0000 mg | DELAYED_RELEASE_TABLET | Freq: Every day | ORAL | Status: DC

## 2012-11-08 MED ORDER — TAMSULOSIN HCL 0.4 MG PO CAPS
0.4000 mg | ORAL_CAPSULE | Freq: Every day | ORAL | Status: DC
  Administered 2012-11-09 – 2012-11-10 (×2): 0.4 mg via ORAL
  Filled 2012-11-08 (×2): qty 1

## 2012-11-08 MED ORDER — ONDANSETRON HCL 4 MG/2ML IJ SOLN: 4.0000 mg | Freq: Four times a day (QID) | INTRAMUSCULAR | Status: DC | PRN

## 2012-11-08 NOTE — H&P (Signed)
Patient ID: Steve Anthony MRN: [id number], DOB/AGE: 05/02/1950   Admit date: 11/08/2012   Primary Physician: [name], MD Primary Cardiologist: J. Allred, MD   Pt. Profile:  62 year old male with prior history of paroxysmal atrial fibrillation status post catheter ablation x2 to presents to the ED with a 2 month history of intermittent palpitations and associated chest pain.  Problem List  Past Medical History  Diagnosis Date  . Paroxysmal a-fib     a. S/P PVI 2/11 and 9/11  . SVT (supraventricular tachycardia)     a. Atypical AVNRT of the slow pathway  . GERD (gastroesophageal reflux disease)   . Nephrolithiasis   . Dyslipidemia   . Hypertension   . CAD (coronary artery disease)     a. S/P PCI mid LAD (vision stent) 08/31/2004;  b. repeat cath 6/11 - no progression of CAD  . OSA (obstructive sleep apnea)     a. cpap noncompliance    Past Surgical History  Procedure Laterality Date  . Cholecystectomy    . Tonsillectomy    . Lumbar disc surgery      DJD S/P multiple discetomies of the lumbar spine  . Atrial ablation surgery  2007    S/P slow pathway ablation for atypical AVNRT   . Ep study  2008    Negative EP study in [place]  . Pci mid lad  2006    Vision  . Pvi/ cti ablation  02/25/2009 and 09/2009    S/P afib/ CTI ablation     Allergies  Allergies  Allergen Reactions  . Penicillins     REACTION: rash, welps    HPI  62 year old male with prior history of coronary artery disease status post prior stenting of the LAD in 2006. Last catheterization in 2011 showed nonobstructive disease. He also has a history of paroxysmal atrial fibrillation which historically has been associated with chest pain and has undergone pulmonary venous isolation and ablation performed by Dr. Rayann Heman x2 in 2011. He is chronically maintained on Coumadin as well as oral beta blocker therapy and generally has done well. Over the past 2 months however, he is been experiencing  intermittent tachypalpitations associated with substernal chest squeezing along with intermittent associated dyspnea, lightheadedness, diaphoresis, or nausea. Symptoms typically last 20 minutes to hours at a time and resolve spontaneously. He has never taken nitroglycerin for his symptoms. Over the past 2 weeks, he has had increasing frequency of both palpitations and chest discomfort, always occurring together, and always resolving at the same time. He's been having intermittent palpitations and chest discomfort throughout the day today, coming on and lasting approximately 20 minutes prior to resolving spontaneously. He was called by our office today to confirm his appointment with Dr. Rayann Heman which had previously been scheduled for tomorrow and upon receiving the phone call, he alert in our office staff of his symptoms. He was advised to come to the ED. Here, he is in sinus rhythm and is pain-free. ECG is nonacute and initial troponin is normal.  Home Medications  Prior to Admission medications   Medication Sig Start Date End Date Taking? Authorizing Provider  atorvastatin (LIPITOR) 40 MG tablet Take 40 mg by mouth at bedtime.    Yes Historical Provider, MD  azithromycin (ZITHROMAX) 250 MG tablet Take 250-500 mg by mouth daily. Zpk. Takes 587m day 1, then 2546mdays 2-5   Yes Historical Provider, MD  cloNIDine (CATAPRES) 0.2 MG tablet Take 0.2-0.4 mg by mouth 3 (three) times daily. Takes  0.81m in the morning, 0.445mat lunch, and 0.38m12mt dinner   Yes Historical Provider, MD  fenofibrate 160 MG tablet Take 160 mg by mouth daily.     Yes Historical Provider, MD  ferrous sulfate 325 (65 FE) MG EC tablet Take 325 mg by mouth daily with breakfast.   Yes Historical Provider, MD  furosemide (LASIX) 20 MG tablet Take 20 mg by mouth daily as needed for fluid.    Yes Historical Provider, MD  HYDROcodone-acetaminophen (LORTAB) 7.5-500 MG per tablet Take 1 tablet by mouth every 6 (six) hours as needed for pain.    Yes Historical Provider, MD  losartan (COZAAR) 50 MG tablet Take 50 mg by mouth daily.   Yes Historical Provider, MD  metFORMIN (GLUCOPHAGE-XR) 500 MG 24 hr tablet Take 1,000 mg by mouth 2 (two) times daily.   Yes Historical Provider, MD  methimazole (TAPAZOLE) 5 MG tablet Take 2.5 mg by mouth daily.   Yes Historical Provider, MD  metoprolol succinate (TOPROL-XL) 50 MG 24 hr tablet Take 50 mg by mouth daily. Take with or immediately following a meal.   Yes Historical Provider, MD  nitroGLYCERIN (NITROSTAT) 0.4 MG SL tablet Place 0.4 mg under the tongue every 5 (five) minutes as needed. May repeat up to 3 doses.    Yes Historical Provider, MD  pantoprazole (PROTONIX) 40 MG tablet Take 40 mg by mouth daily.     Yes Historical Provider, MD  Tamsulosin HCl (FLOMAX) 0.4 MG CAPS Take 0.4 mg by mouth daily.     Yes Historical Provider, MD  warfarin (COUMADIN) 5 MG tablet Take 5-7.5 mg by mouth daily. Takes 7.5mg18m Sunday and Thursday. Takes 5mg 59m other days   Yes Historical Provider, MD  zolpidem (AMBIEN) 10 MG tablet Take 10 mg by mouth at bedtime.    Yes Historical Provider, MD  albuterol-ipratropium (COMBIVENT) 18-103 MCG/ACT inhaler Inhale 2 puffs into the lungs every 4 (four) hours as needed for wheezing or shortness of breath.     Historical Provider, MD    Family History  Family History  Problem Relation Age of Onset  . Emphysema Mother   . Heart attack Father     CVA  . Hypertension Father   . Diabetes Father   . Heart disease Father     MI  . Emphysema Maternal Grandfather     Smoker  . Heart disease Paternal Grandfather     Social History  History   Social History  . Marital Status: Married    Spouse Name: N/A    Number of Children: N/A  . Years of Education: N/A   Occupational History  . DISABLED   . 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 Smoking status: Former Smoker -- 3.00 packs/day for 38 years    Quit date: 03/25/2004  . Smokeless tobacco:  Not on file  . Alcohol Use: No  . Drug Use: No  . Sexual Activity: Not on file   Other Topics Concern  . Not on file   Social History Narrative   [place]   Two children and two grandchildren   Attends [employer] General:  No chills, fever, night sweats or weight changes.  Cardiovascular:  Notable for palpitations and associated chest discomfort with occasional dyspnea, diaphoresis, lightheadedness, or nausea. No dyspnea on exertion, edema, orthopnea, palpitations, paroxysmal nocturnal dyspnea. Dermatological: No rash, lesions/masses Respiratory:  No cough, occasional dyspnea in the setting of palpitations and chest discomfort. Urologic: No hematuria, dysuria Abdominal:   Occasional nausea in the setting of palpitations and chest discomfort., vomiting, diarrhea, bright red blood per rectum, melena, or hematemesis Neurologic:  No visual changes, wkns, changes in mental status. All other systems reviewed and are otherwise negative except as noted above.  Physical Exam  Blood pressure 143/101, pulse 72, temperature 98.4 F (36.9 C), temperature source Oral, resp. rate 18, weight 251 lb 4.8 oz (113.989 kg), SpO2 97.00%.  General: Pleasant, NAD Psych: Normal affect. Neuro: Alert and oriented X 3. Moves all extremities spontaneously. HEENT: Normal  Neck: Supple without bruits or JVD. Lungs:  Resp regular and unlabored, diminished breath sounds bilaterally. Heart: RRR no s3, s4, or murmurs. Abdomen: Soft, non-tender, non-distended, BS + x 4.  Extremities: No clubbing, cyanosis or edema. DP/PT/Radials 2+ and equal bilaterally.  Labs  Trop i, poc: 0.00  pBNP 367.2   Lab Results  Component Value Date   WBC 7.1 11/08/2012   HGB 14.6 11/08/2012   HCT 41.2 11/08/2012   MCV 83.6 11/08/2012   PLT 121* 11/08/2012     Recent Labs Lab 11/08/12 1516  NA 139  K 3.6  CL 100  CO2 26  BUN 8  CREATININE 0.95    CALCIUM 8.0*  GLUCOSE 112*   Lab Results  Component Value Date   INR 1.72* 11/08/2012   INR 2.13* 09/26/2009   INR 2.36* 09/25/2009    Radiology/Studies  No results found.  ECG  Rsr, 88, no acute st/t changes.  ASSESSMENT AND PLAN  1. Paroxysmal atrial fibrillation: Patient reports a two-month history of intermittent palpitations associated with chest pain, occasional dyspnea, diaphoresis, nausea, and/or lightheadedness similar to prior episodes of paroxysmal atrial fibrillation. Symptoms can last anywhere between 20 minutes and several hours. He's had multiple 20 minute episodes today which prompted her to present to the ED. Here, he is in sinus rhythm and troponin is normal. We will admit and cycle cardiac markers. Continue home dose of beta blocker. Add heparin in the setting of subtherapeutic INR. Cont coumadin.  Repeat echo (last in 2011).  He'll be seen by electrophysiology the a.m.  If no evidence of af on monitor here, likely plan for event monitor @ d/c.  2. Chest pain/coronary artery disease: Patient does have a history of coronary artery disease status post stenting of the LAD in 2006. He had a nonobstructive 2011. His been having intermittent chest discomfort over the past 2 months however he says this occurs exclusively in the setting of palpitations which he identifies as being atrial fibrillation. He is currently pain-free. We will cycle cardiac markers and add heparin as above. If he rules in, we'll pursue diagnostic catheterization (will have to hold coumadin). If he rules out however we will likely pursue a non-invasive nuclear evaluation as an outpatient.  Cont statin/bb.  Add asa for now.  3.  DM:  Hold metformin.  Add ssi.  4.  HTN:  bp elevated in Ed.  Pt somewhat anxious.  Resume home meds and follow.  5.  HL:  Cont statin.  Signed, Murray Hodgkins, NP 11/08/2012, 5:47 PM   The patient was seen, examined and discussed with Ignacia Bayley, PA-C and I agree with  the above.   In summary, this is a 62 year old male with prior h/o CAD (s/p LAD stent in 2006, cath in 2011 non-obstructive) paroxysmal atrial fibrillation, s/p pulmonary vein isolation x 2 in  2011. He has experienced palpitations for the last two months that are increasing in frequency and have been associated with chest pain and shortness of breath in the last couple of days. He was supposed to see Dr Rayann Heman in the clinic tomorrow, but was advised to come to the ER instead. The plan is to rule out ACS, the first troponin is negative, ECG is unchanged. Start Heparin as Coumadin is sub therapeutic. We will monitor for episodes of a-fib on telemetry and perform echocardiogram. The last echo from 2011 showed normal LV systolic function  And normal left atrial size.   Ena Dawley, H 11/08/2012

## 2012-11-08 NOTE — ED Provider Notes (Signed)
CSN: [id number]     Arrival date & time 11/08/12  1507 History   First MD Initiated Contact with Patient 11/08/12 1603     Chief Complaint  Patient presents with  . Chest Pain   (Consider location/radiation/quality/duration/timing/severity/associated sxs/prior Treatment) HPI Comments: Steve Anthony is a 62 y.o. male who presents for evaluation of chest and abdominal pain. He has 2 different types of pain that occurred last several days. He has intermittent pressure sensation in his whole abdomen, that radiates to the bilateral anterior chest. He has had 4 episodes, lasting about one hour each. He typically gets nauseated, and sweaty when he has these episodes. Also, today, he has had left anterior chest pain that radiates to the left arm. The pain in her chest feels like a pressure sensation. The pain in her left arm feels like a sharp sensation. The chest pain is intermittent and has lasted "all day." He has not taken anything for the pain. He has not had this constellation of pain previously. He is not having pain while in the emergency department.  He does not take daily aspirin "because I'm on Coumadin." He has had a cardiac catheterization with stenting in 2006. He has also had numerous ablations for tachycardia. He has not had a recent stress test. He denies shortness of breath, cough, dizziness, or inability to walk. He has chronic low back pain. He is taking his usual medications, without relief.   Patient is a 62 y.o. male presenting with chest pain. The history is provided by the patient.  Chest Pain   Past Medical History  Diagnosis Date  . Paroxysmal a-fib     a. S/P PVI 2/11 and 9/11  . SVT (supraventricular tachycardia)     a. Atypical AVNRT of the slow pathway  . GERD (gastroesophageal reflux disease)   . Nephrolithiasis   . Dyslipidemia   . Hypertension   . CAD (coronary artery disease)     a. S/P PCI mid LAD (vision stent) 08/31/2004;  b. repeat cath 6/11 - no progression  of CAD  . OSA (obstructive sleep apnea)     a. cpap noncompliance   Past Surgical History  Procedure Laterality Date  . Cholecystectomy    . Tonsillectomy    . Lumbar disc surgery      DJD S/P multiple discetomies of the lumbar spine  . Atrial ablation surgery  2007    S/P slow pathway ablation for atypical AVNRT   . Ep study  2008    Negative EP study in [place]  . Pci mid lad  2006    Vision  . Pvi/ cti ablation  02/25/2009 and 09/2009    S/P afib/ CTI ablation   Family History  Problem Relation Age of Onset  . Emphysema Mother   . Heart attack Father     CVA  . Hypertension Father   . Diabetes Father   . Heart disease Father     MI  . Emphysema Maternal Grandfather     Smoker  . Heart disease Paternal Grandfather    History  Substance Use Topics  . Smoking status: Former Smoker -- 3.00 packs/day for 38 years    Quit date: 03/25/2004  . Smokeless tobacco: Not on file  . Alcohol Use: No    Review of Systems  Cardiovascular: Positive for chest pain.  All other systems reviewed and are negative.    Allergies  Penicillins  Home Medications   Current Outpatient Rx  Name  Route  Sig  Dispense  Refill  . atorvastatin (LIPITOR) 40 MG tablet   Oral   Take 40 mg by mouth at bedtime.          Marland Kitchen azithromycin (ZITHROMAX) 250 MG tablet   Oral   Take 250-500 mg by mouth daily. Zpk. Takes 585m day 1, then 2573mdays 2-5         . cloNIDine (CATAPRES) 0.2 MG tablet   Oral   Take 0.2-0.4 mg by mouth 3 (three) times daily. Takes 0.42m59mn the morning, 0.4mg56m lunch, and 0.4mg 35mdinner         . fenofibrate 160 MG tablet   Oral   Take 160 mg by mouth daily.           . ferrous sulfate 325 (65 FE) MG EC tablet   Oral   Take 325 mg by mouth daily with breakfast.         . furosemide (LASIX) 20 MG tablet   Oral   Take 20 mg by mouth daily as needed for fluid.          . HYDMarland KitchenOcodone-acetaminophen (LORTAB) 7.5-500 MG per tablet   Oral   Take 1  tablet by mouth every 6 (six) hours as needed for pain.         . losMarland Kitchenrtan (COZAAR) 50 MG tablet   Oral   Take 50 mg by mouth daily.         . metFORMIN (GLUCOPHAGE-XR) 500 MG 24 hr tablet   Oral   Take 1,000 mg by mouth 2 (two) times daily.         . methimazole (TAPAZOLE) 5 MG tablet   Oral   Take 2.5 mg by mouth daily.         . metoprolol succinate (TOPROL-XL) 50 MG 24 hr tablet   Oral   Take 50 mg by mouth daily. Take with or immediately following a meal.         . nitroGLYCERIN (NITROSTAT) 0.4 MG SL tablet   Sublingual   Place 0.4 mg under the tongue every 5 (five) minutes as needed. May repeat up to 3 doses.          . pantoprazole (PROTONIX) 40 MG tablet   Oral   Take 40 mg by mouth daily.           . Tamsulosin HCl (FLOMAX) 0.4 MG CAPS   Oral   Take 0.4 mg by mouth daily.           . warMarland Kitchenarin (COUMADIN) 5 MG tablet   Oral   Take 5-7.5 mg by mouth daily. Takes 7.5mg o39munday and Thursday. Takes 5mg al742mther days         . zolpidem (AMBIEN) 10 MG tablet   Oral   Take 10 mg by mouth at bedtime.          . albutMarland Kitchenrol-ipratropium (COMBIVENT) 18-103 MCG/ACT inhaler   Inhalation   Inhale 2 puffs into the lungs every 4 (four) hours as needed for wheezing or shortness of breath.           BP 143/101  Pulse 72  Temp(Src) 98.4 F (36.9 C) (Oral)  Resp 18  Wt 251 lb 4.8 oz (113.989 kg)  BMI 34.07 kg/m2  SpO2 97% Physical Exam  Nursing note and vitals reviewed. Constitutional: He is oriented to person, place, and time. He appears well-developed.  Appears older than stated age  HENT:  Head:  Normocephalic and atraumatic.  Right Ear: External ear normal.  Left Ear: External ear normal.  Eyes: Conjunctivae and EOM are normal. Pupils are equal, round, and reactive to light.  Neck: Normal range of motion and phonation normal. Neck supple.  Cardiovascular: Normal rate, regular rhythm, normal heart sounds and intact distal pulses.    Pulmonary/Chest: Effort normal and breath sounds normal. No respiratory distress. He has no wheezes. He has no rales. He exhibits tenderness (Left anterior, mild). He exhibits no bony tenderness.  Abdominal: Soft. Normal appearance. He exhibits no distension. There is no tenderness.  Musculoskeletal: Normal range of motion. He exhibits no edema and no tenderness.  Neurological: He is alert and oriented to person, place, and time. No cranial nerve deficit or sensory deficit. He exhibits normal muscle tone. Coordination normal.  Skin: Skin is warm, dry and intact.  Psychiatric: He has a normal mood and affect. His behavior is normal. Judgment and thought content normal.    ED Course  Procedures (including critical care time)  Patient Vitals for the past 24 hrs:  BP Temp Temp src Pulse Resp SpO2 Weight  11/08/12 1655 143/101 mmHg - - 72 18 97 % -  11/08/12 1600 132/100 mmHg - - 73 16 96 % -  11/08/12 1518 127/98 mmHg 98.4 F (36.9 C) Oral 86 16 94 % 251 lb 4.8 oz (113.989 kg)    4:53 PM-Consult complete with [facility]. Patient case explained and discussed. She agrees to admit patient for further evaluation and treatment. Call ended at [facility] Reviewed  CBC - Abnormal; Notable for the following:    Platelets 121 (*)    All other components within normal limits  BASIC METABOLIC PANEL - Abnormal; Notable for the following:    Glucose, Bld 112 (*)    Calcium 8.0 (*)    GFR calc non Af Amer 88 (*)    All other components within normal limits  PRO B NATRIURETIC PEPTIDE - Abnormal; Notable for the following:    Pro B Natriuretic peptide (BNP) 367.2 (*)    All other components within normal limits  PROTIME-INR - Abnormal; Notable for the following:    Prothrombin Time 19.7 (*)    INR 1.72 (*)    All other components within normal limits  POCT I-STAT TROPONIN I   Imaging Review No results found.  EKG Interpretation     Ventricular Rate:  88 PR  Interval:  144 QRS Duration: 92 QT Interval:  366 QTC Calculation: 442 R Axis:   28 Text Interpretation:  Normal sinus rhythm Cannot rule out Anterior infarct , age undetermined Abnormal ECG since last tracing no significant change            MDM   1. Chest pain   2. Abdominal pain   3. Palpitations    Nonspecific chest pain, with history of coronary artery disease. Initial evaluation, negative for infarct. No active chest pain in the emergency department. He is at moderate risk coronary artery disease patient. [facility] consult cardiology for evaluation in the emergency department   He was seen by cardiology and they elected to admit him.  Nursing Notes Reviewed/ Care Coordinated, and agree without changes. Applicable Imaging Reviewed.  Interpretation of Laboratory Data incorporated into ED treatment  Plan: Admit  Richarda Blade, MD 11/08/12 [phone]

## 2012-11-08 NOTE — Telephone Encounter (Signed)
Patient having

## 2012-11-08 NOTE — ED Notes (Signed)
The pt is c/o lt upper chest pressure all day with nausea and some sob..  The pt has had these episodes  [place] for the past week intermittently. He did not take nitro. The pain radiates down to his lt elbow

## 2012-11-08 NOTE — ED Notes (Signed)
MD at bedside. [facility]

## 2012-11-08 NOTE — Progress Notes (Signed)
ANTICOAGULATION CONSULT NOTE - Initial Consult  Pharmacy Consult for heparin and warfarin Indication: chest pain/ACS and atrial fibrillation  Allergies  Allergen Reactions  . Penicillins     REACTION: rash, welps    Patient Measurements: Weight: 251 lb 4.8 oz (113.989 kg) Heparin Dosing Weight: 102 KG  Vital Signs: Temp: 98.4 F (36.9 C) (10/15 1518) Temp src: Oral (10/15 1518) BP: 147/95 mmHg (10/15 1930) Pulse Rate: 68 (10/15 1930)  Labs:  Recent Labs  11/08/12 1516  HGB 14.6  HCT 41.2  PLT 121*  LABPROT 19.7*  INR 1.72*  CREATININE 0.95    The CrCl is unknown because both a height and weight (above a minimum accepted value) are required for this calculation.   Medical History: Past Medical History  Diagnosis Date  . Paroxysmal a-fib     a. S/P PVI 2/11 and 9/11  . SVT (supraventricular tachycardia)     a. Atypical AVNRT of the slow pathway  . GERD (gastroesophageal reflux disease)   . Nephrolithiasis   . Dyslipidemia   . Hypertension   . CAD (coronary artery disease)     a. S/P PCI mid LAD (vision stent) 08/31/2004;  b. repeat cath 6/11 - no progression of CAD  . OSA (obstructive sleep apnea)     a. cpap noncompliance    Medications:  See home med list.  Home warfarin dose is 7.5 mg on Mon and Thursday and 33m on other days  Assessment: 62year old man on chronic warfarin for AFIB to continue on warfarin while hospitalized.  Heparin drip to start as INR subtherapeutic at 1.72 Goal of Therapy:  INR 2-3 Heparin level 0.3-0.7 units/ml Monitor platelets by anticoagulation protocol: Yes   Plan:  Give 4000 units bolus x 1 Start heparin infusion at 1400 units/hr Check anti-Xa level in 6 hours and daily while on heparin Continue to monitor H&H and platelets Warfarin 7.550mx 1 dose today  FrCandie Mile0/15/2014,7:54 PM

## 2012-11-08 NOTE — Telephone Encounter (Signed)
Patient was called by Erlene Quan to remind of his appointment tomorrow. Patient told him he had been having chest tightness. Called and spoke with the patient and he stated that his blood pressure when he got up this am (around 7:00 am) was 133/91 HR was 74.  10:30 am took blood pressure 119/83 heart rate 122, rested and at 12:00 blood pressure 142/93 heart rate 98. Patient has been having pian/tightness in his chest radiating down left arm. Does have some nausea, states heart feels funny. Denies any shortness of breath. Patient does not really want to go to ED. Discussed with Arcola Jansky RN with Dr Rayann Heman, she suggested ED. Spoke with patient and advised ED. Patient does not want to call 911 stating that EMS will insist on taking him to [facility] and he does not want to go there. He was going to call his daughter and see if she could drive him to [facility] or [place].

## 2012-11-08 NOTE — Telephone Encounter (Signed)
New Problem:  Pt states he feels like he has a head cold. Pt states he has tightness in his chest, and his heart has been racing today. Pt wants to know if he can be seen today. Please advise

## 2012-11-09 ENCOUNTER — Ambulatory Visit: Payer: Medicare PPO | Admitting: Internal Medicine

## 2012-11-09 ENCOUNTER — Observation Stay (HOSPITAL_COMMUNITY): Payer: Medicare PPO

## 2012-11-09 DIAGNOSIS — I517 Cardiomegaly: Secondary | ICD-10-CM

## 2012-11-09 DIAGNOSIS — R0602 Shortness of breath: Secondary | ICD-10-CM

## 2012-11-09 DIAGNOSIS — I10 Essential (primary) hypertension: Secondary | ICD-10-CM

## 2012-11-09 DIAGNOSIS — R002 Palpitations: Secondary | ICD-10-CM

## 2012-11-09 LAB — COMPREHENSIVE METABOLIC PANEL
ALT: 19 U/L (ref 0–53)
AST: 15 U/L (ref 0–37)
Albumin: 3.5 g/dL (ref 3.5–5.2)
Alkaline Phosphatase: 31 U/L — ABNORMAL LOW (ref 39–117)
Chloride: 98 mEq/L (ref 96–112)
Potassium: 3 mEq/L — ABNORMAL LOW (ref 3.5–5.1)
Sodium: 138 mEq/L (ref 135–145)
Total Bilirubin: 0.9 mg/dL (ref 0.3–1.2)
Total Protein: 6 g/dL (ref 6.0–8.3)

## 2012-11-09 LAB — LIPID PANEL
LDL Cholesterol: 22 mg/dL (ref 0–99)
Total CHOL/HDL Ratio: 4.5 RATIO
VLDL: 58 mg/dL — ABNORMAL HIGH (ref 0–40)

## 2012-11-09 LAB — GLUCOSE, CAPILLARY
Glucose-Capillary: 122 mg/dL — ABNORMAL HIGH (ref 70–99)
Glucose-Capillary: 128 mg/dL — ABNORMAL HIGH (ref 70–99)
Glucose-Capillary: 119 mg/dL — ABNORMAL HIGH (ref 70–99)
Glucose-Capillary: 148 mg/dL — ABNORMAL HIGH (ref 70–99)

## 2012-11-09 LAB — HEPARIN LEVEL (UNFRACTIONATED)
Heparin Unfractionated: <0.1 IU/mL — ABNORMAL LOW (ref 0.30–0.70)
Heparin Unfractionated: 0.18 IU/mL — ABNORMAL LOW (ref 0.30–0.70)

## 2012-11-09 LAB — CBC WITH DIFFERENTIAL/PLATELET
Basophils Absolute: 0 10*3/uL (ref 0.0–0.1)
Basophils Relative: 0 % (ref 0–1)
Eosinophils Absolute: 0.3 10*3/uL (ref 0.0–0.7)
Hemoglobin: 14.2 g/dL (ref 13.0–17.0)
MCH: 29.4 pg (ref 26.0–34.0)
MCHC: 35.6 g/dL (ref 30.0–36.0)
Monocytes Absolute: 0.4 10*3/uL (ref 0.1–1.0)
Neutro Abs: 3.8 10*3/uL (ref 1.7–7.7)
Neutrophils Relative %: 65 % (ref 43–77)
Platelets: 108 10*3/uL — ABNORMAL LOW (ref 150–400)
RDW: 13.3 % (ref 11.5–15.5)

## 2012-11-09 LAB — MAGNESIUM
Magnesium: 0.7 mg/dL — CL (ref 1.5–2.5)
Magnesium: 1.9 mg/dL (ref 1.5–2.5)

## 2012-11-09 LAB — TSH
TSH: 4.455 u[IU]/mL (ref 0.350–4.500)
TSH: 2.816 u[IU]/mL (ref 0.350–4.500)

## 2012-11-09 LAB — TROPONIN I
Troponin I: <0.3 ng/mL (ref <0.30)
Troponin I: <0.3 ng/mL (ref <0.30)

## 2012-11-09 LAB — PROTIME-INR: INR: 1.76 — ABNORMAL HIGH (ref 0.00–1.49)

## 2012-11-09 LAB — T4, FREE
Free T4: 1.24 ng/dL (ref 0.80–1.80)
Free T4: 1.09 ng/dL (ref 0.80–1.80)

## 2012-11-09 MED ORDER — METOPROLOL TARTRATE 1 MG/ML IV SOLN
INTRAVENOUS | Status: AC
  Administered 2012-11-09: 2.5 mg via INTRAVENOUS
  Filled 2012-11-09: qty 15

## 2012-11-09 MED ORDER — VANCOMYCIN HCL IN DEXTROSE 1-5 GM/200ML-% IV SOLN: 1000.0000 mg | INTRAVENOUS | Status: DC

## 2012-11-09 MED ORDER — NITROGLYCERIN 0.4 MG SL SUBL
SUBLINGUAL_TABLET | SUBLINGUAL | Status: AC
  Administered 2012-11-09: 0.4 mg via SUBLINGUAL
  Filled 2012-11-09: qty 25

## 2012-11-09 MED ORDER — MAGNESIUM SULFATE 40 MG/ML IJ SOLN
2.0000 g | Freq: Once | INTRAMUSCULAR | Status: AC
  Administered 2012-11-09: 2 g via INTRAVENOUS
  Filled 2012-11-09: qty 50

## 2012-11-09 MED ORDER — WARFARIN SODIUM 7.5 MG PO TABS
7.5000 mg | ORAL_TABLET | Freq: Once | ORAL | Status: AC
  Administered 2012-11-09: 7.5 mg via ORAL
  Filled 2012-11-09: qty 1

## 2012-11-09 MED ORDER — MAGNESIUM SULFATE 40 MG/ML IJ SOLN
2.0000 g | Freq: Once | INTRAMUSCULAR | Status: AC
  Administered 2012-11-09: 2 g via INTRAVENOUS
  Filled 2012-11-09 (×2): qty 50

## 2012-11-09 MED ORDER — POTASSIUM CHLORIDE CRYS ER 20 MEQ PO TBCR
40.0000 meq | EXTENDED_RELEASE_TABLET | Freq: Two times a day (BID) | ORAL | Status: DC
  Administered 2012-11-09 – 2012-11-10 (×4): 40 meq via ORAL
  Filled 2012-11-09 (×5): qty 2

## 2012-11-09 MED ORDER — IOHEXOL 350 MG/ML SOLN: 80.0000 mL | Freq: Once | INTRAVENOUS | Status: AC | PRN

## 2012-11-09 MED ORDER — CHLORHEXIDINE GLUCONATE 4 % EX LIQD
60.0000 mL | Freq: Once | CUTANEOUS | Status: AC
  Administered 2012-11-10: 4 via TOPICAL
  Filled 2012-11-09: qty 60

## 2012-11-09 NOTE — Progress Notes (Signed)
  Echocardiogram 2D Echocardiogram has been performed.  Jhace Fennell, [place] 11/09/2012, 10:35 AM

## 2012-11-09 NOTE — Care Management Note (Unsigned)
    Page 1 of 1   11/09/2012     4:20:08 PM   CARE MANAGEMENT NOTE 11/09/2012  Patient:  Steve Anthony, Steve Anthony   Account Number:  [id number]  Date Initiated:  11/09/2012  Documentation initiated by:  GRAVES-BIGELOW,Cristine Daw  Subjective/Objective Assessment:   Pt admitted for cp. Pt is from home with wife.     Action/Plan:   CM did call [employer] and pt has DME via [employer] a concentrator that was delivered 09-26-12. Per wife pt has cpap and needs a mask. CM did call [facility] for assistance.   Anticipated DC Date:  11/10/2012   Anticipated DC Plan:  [place]  CM consult      PAC Choice  DURABLE MEDICAL EQUIPMENT   Choice offered to / List presented to:             Status of service:  In process, will continue to follow Medicare Important Message given?   (If response is "NO", the following Medicare IM given date fields will be blank) Date Medicare IM given:   Date Additional Medicare IM given:    Discharge Disposition:    Per UR Regulation:  Reviewed for med. necessity/level of care/duration of stay  If discussed at [place] of Stay Meetings, dates discussed:    Comments:

## 2012-11-09 NOTE — Progress Notes (Signed)
Utilization review completed.  

## 2012-11-09 NOTE — Progress Notes (Signed)
CRITICAL VALUE ALERT  Critical value received: Mag 0.7  Date of notification:  11/08/13  Time of notification: 2350  Critical value read back: yes  Nurse who received alert:  Ebony Hail  MD notified (1st page):Balfour  Time of first page:  2355  MD notified (2nd page):  Time of second page:  Responding MD: Colon Flattery  Time MD responded:  0000

## 2012-11-09 NOTE — Progress Notes (Signed)
SUBJECTIVE: The patient is doing well today.  He has had some palpitations this am.  At this time, he denies chest pain, shortness of breath, or any new concerns.  Marland Kitchen aspirin EC  81 mg Oral Daily  . atorvastatin  40 mg Oral QHS  . azithromycin  250 mg Oral Daily  . [START ON 11/10/2012] chlorhexidine  60 mL Topical Once  . cloNIDine  0.2 mg Oral TID  . fenofibrate  160 mg Oral Daily  . ferrous sulfate  325 mg Oral Q breakfast  . insulin aspart  0-15 Units Subcutaneous TID WC  . losartan  50 mg Oral Daily  . magnesium sulfate 1 - 4 g bolus IVPB  2 g Intravenous Once  . methimazole  2.5 mg Oral Daily  . metoprolol succinate  50 mg Oral Daily  . pantoprazole  40 mg Oral Daily  . potassium chloride  40 mEq Oral BID  . tamsulosin  0.4 mg Oral Daily  . Warfarin - Pharmacist Dosing Inpatient   Does not apply q1800  . zolpidem  10 mg Oral QHS   . heparin 1,800 Units/hr (11/09/12 0312)    OBJECTIVE: Physical Exam: Filed Vitals:   11/08/12 1900 11/08/12 1930 11/08/12 2110 11/09/12 0534  BP: 125/91 147/95 132/96 139/82  Pulse: 70 68 66 63  Temp:   98.3 F (36.8 C) 98.1 F (36.7 C)  TempSrc:   Oral Oral  Resp: 19 19 18 18   Height:   6' (1.829 m)   Weight:   250 lb (113.399 kg)   SpO2: 93% 94% 98% 96%    Intake/Output Summary (Last 24 hours) at 11/09/12 0941 Last data filed at 11/09/12 9450  Gross per 24 hour  Intake      0 ml  Output   1325 ml  Net  -1325 ml    Telemetry reveals sinus rhythm, rare PVCs, no arrhythmias  GEN- The patient is well appearing, alert and oriented x 3 today.   Head- normocephalic, atraumatic Eyes-  Sclera clear, conjunctiva pink Ears- hearing intact Oropharynx- clear Neck- supple, no JVP Lymph- no cervical lymphadenopathy Lungs- Clear to ausculation bilaterally, normal work of breathing Heart- Regular rate and rhythm, no murmurs, rubs or gallops, PMI not laterally displaced GI- soft, NT, ND, + BS Extremities- no clubbing, cyanosis, or  edema Skin- no rash or lesion Psych- euthymic mood, full affect Neuro- strength and sensation are intact  LABS: Basic Metabolic Panel:  Recent Labs  11/08/12 1516 11/08/12 2100 11/09/12 0230  NA 139  --  138  K 3.6  --  3.0*  CL 100  --  98  CO2 26  --  28  GLUCOSE 112*  --  113*  BUN 8  --  9  CREATININE 0.95  --  0.89  CALCIUM 8.0*  --  7.6*  MG  --  0.7*  --    Liver Function Tests:  Recent Labs  11/09/12 0230  AST 15  ALT 19  ALKPHOS 31*  BILITOT 0.9  PROT 6.0  ALBUMIN 3.5   No results found for this basename: LIPASE, AMYLASE,  in the last 72 hours CBC:  Recent Labs  11/08/12 1516 11/09/12 0230  WBC 7.1 5.8  NEUTROABS  --  3.8  HGB 14.6 14.2  HCT 41.2 39.9  MCV 83.6 82.6  PLT 121* 108*   Cardiac Enzymes:  Recent Labs  11/08/12 2034 11/09/12 0230 11/09/12 0757  TROPONINI <0.30 <0.30 <0.30   BNP: No components  found with this basename: POCBNP,  D-Dimer: No results found for this basename: DDIMER,  in the last 72 hours Hemoglobin A1C: No results found for this basename: HGBA1C,  in the last 72 hours Fasting Lipid Panel:  Recent Labs  11/09/12 0230  CHOL 103  HDL 23*  LDLCALC 22  TRIG 289*  CHOLHDL 4.5   Thyroid Function Tests: No results found for this basename: TSH, T4TOTAL, FREET3, T3FREE, THYROIDAB,  in the last 72 hours Anemia Panel: No results found for this basename: VITAMINB12, FOLATE, FERRITIN, TIBC, IRON, RETICCTPCT,  in the last 72 hours  RADIOLOGY: No results found.  ASSESSMENT AND PLAN:  1. Chest pain/ CAD He has both typical and atypical features.  Will proceed with cardiac CT to evaluate for progressive CAD. CMs negative this far Continue medical therapy unless CT is high risk Will also assess pulmonary veins with CT to exclude PV stenosis as a cause for his symptoms post ablation  2. Palpitations/ afib He reports palpitations concerning for afib, however his telemetry has been unremarkable. I think that LINQ  implant to assess afib burden and better characterize palpitations is necessary.  Risks, benefits, and alternatives to this procedure are discussed with the patient who wishes to proceed with LINQ implant in am.  3. HTN Stable No change required today  4. Anticoagulation Goal INR 2-3  5. Hypomagnesemia/ hypokalemia replete    Thompson Grayer, MD 11/09/2012 9:41 AM

## 2012-11-09 NOTE — Progress Notes (Signed)
ANTICOAGULATION CONSULT NOTE - Follow Up Consult  Pharmacy Consult for heparin Indication: chest pain/ACS and atrial fibrillation  Labs:  Recent Labs  11/08/12 1516 11/08/12 2034 11/09/12 0230  HGB 14.6  --  14.2  HCT 41.2  --  39.9  PLT 121*  --  PENDING  LABPROT 19.7*  --  20.0*  INR 1.72*  --  1.76*  HEPARINUNFRC  --   --  <0.10*  CREATININE 0.95  --   --   TROPONINI  --  <0.30  --     Assessment: 62yo male undetectable on heparin with initial dosing for CP and Afib w/ low INR.  Goal of Therapy:  Heparin level 0.3-0.7 units/ml   Plan:  Will increase heparin gtt by 4 units/kg/hr to 1800 units/hr and check level in 6hr.  Wynona Neat, PharmD, BCPS  11/09/2012,3:06 AM

## 2012-11-09 NOTE — Progress Notes (Signed)
ANTICOAGULATION CONSULT NOTE - Initial Consult  Pharmacy Consult for warfarin Indication: Afib  Allergies  Allergen Reactions  . Penicillins     REACTION: rash, welps    Patient Measurements: Height: 6' (182.9 cm) Weight: 250 lb (113.399 kg) IBW/kg (Calculated) : 77.6 Heparin Dosing Weight: 102 KG  Vital Signs: Temp: 98.1 F (36.7 C) (10/16 0534) Temp src: Oral (10/16 0534) BP: 139/82 mmHg (10/16 0534) Pulse Rate: 63 (10/16 0534)  Labs:  Recent Labs  11/08/12 1516 11/08/12 2034 11/09/12 0230 11/09/12 0757  HGB 14.6  --  14.2  --   HCT 41.2  --  39.9  --   PLT 121*  --  108*  --   LABPROT 19.7*  --  20.0*  --   INR 1.72*  --  1.76*  --   HEPARINUNFRC  --   --  <0.10*  --   CREATININE 0.95  --  0.89  --   TROPONINI  --  <0.30 <0.30 <0.30    Estimated Creatinine Clearance: 113.3 ml/min (by C-G formula based on Cr of 0.89).   Medical History: Past Medical History  Diagnosis Date  . Paroxysmal a-fib     a. S/P PVI 2/11 and 9/11  . SVT (supraventricular tachycardia)     a. Atypical AVNRT of the slow pathway  . GERD (gastroesophageal reflux disease)   . Nephrolithiasis   . Dyslipidemia   . Hypertension   . CAD (coronary artery disease)     a. S/P PCI mid LAD (vision stent) 08/31/2004;  b. repeat cath 6/11 - no progression of CAD  . OSA (obstructive sleep apnea)     a. cpap noncompliance  . COPD (chronic obstructive pulmonary disease)   . Hypothyroidism   . Type II diabetes mellitus   . KGOVPCHE(035.2)     "sometimes weekly" (11/08/2012)  . DJD (degenerative joint disease) of lumbar spine   . Arthritis     "in my back and right leg" (11/08/2012)  . Chronic lower back pain   . On home oxygen therapy     "2L q hs" (11/08/2012)    Assessment: 62 year old man on chronic warfarin for AFIB to continue on warfarin while hospitalized. Cardiac enzymes has been negative and IV heparin has been d/c'd. Will proceed with cardiac CT to evaluate for progressive CAD,  and loop recorder implantation tomorrow. INR 1.76 this morning, hgb 14.2, stable, plt 108 (chronically low 100-150K), No bleeding noted per chart.  Coumadin PTA dose - 7.5 on Mon and Thurs; 52m on other days  Goal of Therapy:  INR 2.0 - 3.0 Monitor platelets by anticoagulation protocol: Yes   Plan:  Warfarin 7.588mx 1 dose today F/u daily PT/ INR  MeMaryanna ShapePharmD, BCPS  Clinical Pharmacist  Pager: [phone] 11/09/2012,10:15 AM

## 2012-11-10 ENCOUNTER — Encounter (HOSPITAL_COMMUNITY)
Admission: EM | Disposition: A | Discharge status: Home / Self Care | Payer: Self-pay | Source: Home / Self Care | Attending: Emergency Medicine

## 2012-11-10 ENCOUNTER — Other Ambulatory Visit: Payer: Self-pay | Admitting: Physician Assistant

## 2012-11-10 ENCOUNTER — Encounter (HOSPITAL_COMMUNITY): Payer: Self-pay | Admitting: Physician Assistant

## 2012-11-10 DIAGNOSIS — E876 Hypokalemia: Secondary | ICD-10-CM

## 2012-11-10 DIAGNOSIS — R002 Palpitations: Secondary | ICD-10-CM

## 2012-11-10 DIAGNOSIS — R079 Chest pain, unspecified: Secondary | ICD-10-CM

## 2012-11-10 DIAGNOSIS — I2510 Atherosclerotic heart disease of native coronary artery without angina pectoris: Secondary | ICD-10-CM

## 2012-11-10 DIAGNOSIS — I200 Unstable angina: Secondary | ICD-10-CM

## 2012-11-10 HISTORY — PX: LEFT HEART CATHETERIZATION WITH CORONARY ANGIOGRAM: SHX5451

## 2012-11-10 HISTORY — PX: LOOP RECORDER IMPLANT: SHX5477

## 2012-11-10 LAB — BASIC METABOLIC PANEL
BUN: 8 mg/dL (ref 6–23)
Creatinine, Ser: 0.85 mg/dL (ref 0.50–1.35)
GFR calc Af Amer: >90 mL/min (ref >90)
GFR calc non Af Amer: >90 mL/min (ref >90)
Glucose, Bld: 114 mg/dL — ABNORMAL HIGH (ref 70–99)
Potassium: 3.9 mEq/L (ref 3.5–5.1)

## 2012-11-10 LAB — GLUCOSE, CAPILLARY
Glucose-Capillary: 112 mg/dL — ABNORMAL HIGH (ref 70–99)
Glucose-Capillary: 114 mg/dL — ABNORMAL HIGH (ref 70–99)
Glucose-Capillary: 131 mg/dL — ABNORMAL HIGH (ref 70–99)

## 2012-11-10 LAB — CBC
HCT: 41.2 % (ref 39.0–52.0)
Hemoglobin: 14.5 g/dL (ref 13.0–17.0)
MCH: 29.6 pg (ref 26.0–34.0)
MCHC: 35.2 g/dL (ref 30.0–36.0)
MCV: 84.1 fL (ref 78.0–100.0)
RDW: 13.7 % (ref 11.5–15.5)

## 2012-11-10 LAB — PROTIME-INR: INR: 1.7 — ABNORMAL HIGH (ref 0.00–1.49)

## 2012-11-10 SURGERY — LOOP RECORDER IMPLANT
Anesthesia: LOCAL

## 2012-11-10 SURGERY — LEFT HEART CATHETERIZATION WITH CORONARY ANGIOGRAM
Anesthesia: LOCAL

## 2012-11-10 MED ORDER — HEPARIN SODIUM (PORCINE) 1000 UNIT/ML IJ SOLN
INTRAMUSCULAR | Status: AC
  Filled 2012-11-10: qty 1

## 2012-11-10 MED ORDER — POTASSIUM CHLORIDE ER 20 MEQ PO TBCR: EXTENDED_RELEASE_TABLET | ORAL | Status: DC

## 2012-11-10 MED ORDER — ASPIRIN 81 MG PO CHEW: 81.0000 mg | CHEWABLE_TABLET | ORAL | Status: DC

## 2012-11-10 MED ORDER — HEPARIN (PORCINE) IN NACL 2-0.9 UNIT/ML-% IJ SOLN
INTRAMUSCULAR | Status: AC
  Filled 2012-11-10: qty 1500

## 2012-11-10 MED ORDER — METFORMIN HCL ER 500 MG PO TB24: 1000.0000 mg | ORAL_TABLET | Freq: Two times a day (BID) | ORAL | Status: DC

## 2012-11-10 MED ORDER — SODIUM CHLORIDE 0.9 % IV SOLN: INTRAVENOUS | Status: AC

## 2012-11-10 MED ORDER — WARFARIN SODIUM 7.5 MG PO TABS
7.5000 mg | ORAL_TABLET | Freq: Once | ORAL | Status: DC
  Filled 2012-11-10: qty 1

## 2012-11-10 MED ORDER — NITROGLYCERIN 0.2 MG/ML ON CALL CATH LAB
INTRAVENOUS | Status: AC
  Filled 2012-11-10: qty 1

## 2012-11-10 MED ORDER — SODIUM CHLORIDE 0.9 % IV SOLN: 250.0000 mL | INTRAVENOUS | Status: DC | PRN

## 2012-11-10 MED ORDER — SODIUM CHLORIDE 0.9 % IV SOLN
1.0000 mL/kg/h | INTRAVENOUS | Status: DC
  Administered 2012-11-10: 1 mL/kg/h via INTRAVENOUS

## 2012-11-10 MED ORDER — FENTANYL CITRATE 0.05 MG/ML IJ SOLN
INTRAMUSCULAR | Status: AC
  Filled 2012-11-10: qty 2

## 2012-11-10 MED ORDER — ASPIRIN 81 MG PO TBEC: 81.0000 mg | DELAYED_RELEASE_TABLET | Freq: Every day | ORAL | Status: DC

## 2012-11-10 MED ORDER — LIDOCAINE HCL (PF) 1 % IJ SOLN
INTRAMUSCULAR | Status: AC
  Filled 2012-11-10: qty 30

## 2012-11-10 MED ORDER — MIDAZOLAM HCL 2 MG/2ML IJ SOLN
INTRAMUSCULAR | Status: AC
  Filled 2012-11-10: qty 2

## 2012-11-10 MED ORDER — VERAPAMIL HCL 2.5 MG/ML IV SOLN
INTRAVENOUS | Status: AC
  Filled 2012-11-10: qty 2

## 2012-11-10 MED ORDER — SODIUM CHLORIDE 0.9 % IJ SOLN: 3.0000 mL | Freq: Two times a day (BID) | INTRAMUSCULAR | Status: DC

## 2012-11-10 MED ORDER — MAGNESIUM OXIDE 400 (241.3 MG) MG PO TABS: 400.0000 mg | ORAL_TABLET | Freq: Two times a day (BID) | ORAL | Status: DC

## 2012-11-10 MED ORDER — SODIUM CHLORIDE 0.9 % IJ SOLN: 3.0000 mL | INTRAMUSCULAR | Status: DC | PRN

## 2012-11-10 MED ORDER — ASPIRIN 81 MG PO CHEW
81.0000 mg | CHEWABLE_TABLET | ORAL | Status: AC
  Administered 2012-11-10: 81 mg via ORAL
  Filled 2012-11-10: qty 1

## 2012-11-10 NOTE — Interval H&P Note (Signed)
History and Physical Interval Note:  11/10/2012 7:35 AM  Steve Anthony  has presented today for surgery, with the diagnosis of snycope  The various methods of treatment have been discussed with the patient and family. After consideration of risks, benefits and other options for treatment, the patient has consented to  Procedure(s): LOOP RECORDER IMPLANT (N/A) as a surgical intervention .  The patient's history has been reviewed, patient examined, no change in status, stable for surgery.  I have reviewed the patient's chart and labs.  Questions were answered to the patient's satisfaction.     Steve Anthony

## 2012-11-10 NOTE — H&P (View-Only) (Signed)
SUBJECTIVE: The patient is doing well today.  No further palpitations  At this time, he denies chest pain, shortness of breath, or any new concerns.  [facility] HOLD] aspirin EC  81 mg Oral Daily  . [MAR HOLD] atorvastatin  40 mg Oral QHS  . [facility] HOLD] azithromycin  250 mg Oral Daily  . [facility] HOLD] cloNIDine  0.2 mg Oral TID  . [facility] HOLD] fenofibrate  160 mg Oral Daily  . [facility] HOLD] ferrous sulfate  325 mg Oral Q breakfast  . [MAR HOLD] insulin aspart  0-15 Units Subcutaneous TID WC  . [MAR HOLD] losartan  50 mg Oral Daily  . [facility] HOLD] methimazole  2.5 mg Oral Daily  . [facility] HOLD] metoprolol succinate  50 mg Oral Daily  . [MAR HOLD] pantoprazole  40 mg Oral Daily  . [facility] HOLD] potassium chloride  40 mEq Oral BID  . [facility] HOLD] tamsulosin  0.4 mg Oral Daily  . [facility] HOLD] Warfarin - Pharmacist Dosing Inpatient   Does not apply q1800  . [facility] HOLD] zolpidem  10 mg Oral QHS      OBJECTIVE: Physical Exam: Filed Vitals:   11/09/12 1540 11/09/12 1545 11/09/12 2005 11/10/12 0530  BP:  152/99 122/78 159/99  Pulse: 69 74 66 60  Temp:   97.9 F (36.6 C) 97.5 F (36.4 C)  TempSrc:      Resp:   18 20  Height:      Weight:      SpO2:   96% 97%    Intake/Output Summary (Last 24 hours) at 11/10/12 0734 Last data filed at 11/09/12 2200  Gross per 24 hour  Intake    440 ml  Output    800 ml  Net   -360 ml    Telemetry reveals sinus rhythm   GEN- The patient is well appearing, alert and oriented x 3 today.   Head- normocephalic, atraumatic Eyes-  Sclera clear, conjunctiva pink Ears- hearing intact Oropharynx- clear Neck- supple, no JVP Lymph- no cervical lymphadenopathy Lungs- Clear to ausculation bilaterally, normal work of breathing Heart- Regular rate and rhythm, no murmurs, rubs or gallops, PMI not laterally displaced GI- soft, NT, ND, + BS Extremities- no clubbing, cyanosis, or edema Skin- no rash or lesion Psych- euthymic mood, full affect Neuro- strength and sensation are  intact  LABS: Basic Metabolic Panel:  Recent Labs  11/09/12 0230 11/09/12 0935 11/10/12 0435  NA 138  --  140  K 3.0*  --  3.9  CL 98  --  105  CO2 28  --  24  GLUCOSE 113*  --  114*  BUN 9  --  8  CREATININE 0.89  --  0.85  CALCIUM 7.6*  --  8.5  MG  --  1.9 2.0   Liver Function Tests:  Recent Labs  11/09/12 0230  AST 15  ALT 19  ALKPHOS 31*  BILITOT 0.9  PROT 6.0  ALBUMIN 3.5   No results found for this basename: LIPASE, AMYLASE,  in the last 72 hours CBC:  Recent Labs  11/09/12 0230 11/10/12 0435  WBC 5.8 5.2  NEUTROABS 3.8  --   HGB 14.2 14.5  HCT 39.9 41.2  MCV 82.6 84.1  PLT 108* 123*   Cardiac Enzymes:  Recent Labs  11/08/12 2034 11/09/12 0230 11/09/12 0757  TROPONINI <0.30 <0.30 <0.30   BNP: No components found with this basename: POCBNP,  D-Dimer: No results found for this basename: DDIMER,  in  the last 72 hours Hemoglobin A1C: No results found for this basename: HGBA1C,  in the last 72 hours Fasting Lipid Panel:  Recent Labs  11/09/12 0230  CHOL 103  HDL 23*  LDLCALC 22  TRIG 289*  CHOLHDL 4.5   Thyroid Function Tests:  Recent Labs  11/09/12 1151  TSH 2.816   Anemia Panel: No results found for this basename: VITAMINB12, FOLATE, FERRITIN, TIBC, IRON, RETICCTPCT,  in the last 72 hours  RADIOLOGY: No results found.  ASSESSMENT AND PLAN:  1. Chest pain/ CAD Resolved Cardiac CT results are pending  2. Palpitations/ afib Risks, benefits, and alternatives to this procedure are discussed with the patient who wishes to proceed with LINQ implant in am.  He wishes to proceed at this time.  3. HTN Stable No change required today  4. Anticoagulation Goal INR 2-3  5. Hypomagnesemia/ hypokalemia replete  Disposition pending results of cardiac CT Possibly home later today  Thompson Grayer, MD 11/10/2012 7:34 AM

## 2012-11-10 NOTE — Progress Notes (Addendum)
SUBJECTIVE: The patient is doing well today.  No further palpitations  At this time, he denies chest pain, shortness of breath, or any new concerns.  [facility] HOLD] aspirin EC  81 mg Oral Daily  . [MAR HOLD] atorvastatin  40 mg Oral QHS  . [facility] HOLD] azithromycin  250 mg Oral Daily  . [facility] HOLD] cloNIDine  0.2 mg Oral TID  . [facility] HOLD] fenofibrate  160 mg Oral Daily  . [facility] HOLD] ferrous sulfate  325 mg Oral Q breakfast  . [MAR HOLD] insulin aspart  0-15 Units Subcutaneous TID WC  . [MAR HOLD] losartan  50 mg Oral Daily  . [facility] HOLD] methimazole  2.5 mg Oral Daily  . [facility] HOLD] metoprolol succinate  50 mg Oral Daily  . [MAR HOLD] pantoprazole  40 mg Oral Daily  . [facility] HOLD] potassium chloride  40 mEq Oral BID  . [facility] HOLD] tamsulosin  0.4 mg Oral Daily  . [facility] HOLD] Warfarin - Pharmacist Dosing Inpatient   Does not apply q1800  . [facility] HOLD] zolpidem  10 mg Oral QHS      OBJECTIVE: Physical Exam: Filed Vitals:   11/09/12 1540 11/09/12 1545 11/09/12 2005 11/10/12 0530  BP:  152/99 122/78 159/99  Pulse: 69 74 66 60  Temp:   97.9 F (36.6 C) 97.5 F (36.4 C)  TempSrc:      Resp:   18 20  Height:      Weight:      SpO2:   96% 97%    Intake/Output Summary (Last 24 hours) at 11/10/12 0734 Last data filed at 11/09/12 2200  Gross per 24 hour  Intake    440 ml  Output    800 ml  Net   -360 ml    Telemetry reveals sinus rhythm   GEN- The patient is well appearing, alert and oriented x 3 today.   Head- normocephalic, atraumatic Eyes-  Sclera clear, conjunctiva pink Ears- hearing intact Oropharynx- clear Neck- supple, no JVP Lymph- no cervical lymphadenopathy Lungs- Clear to ausculation bilaterally, normal work of breathing Heart- Regular rate and rhythm, no murmurs, rubs or gallops, PMI not laterally displaced GI- soft, NT, ND, + BS Extremities- no clubbing, cyanosis, or edema Skin- no rash or lesion Psych- euthymic mood, full affect Neuro- strength and sensation are  intact  LABS: Basic Metabolic Panel:  Recent Labs  11/09/12 0230 11/09/12 0935 11/10/12 0435  NA 138  --  140  K 3.0*  --  3.9  CL 98  --  105  CO2 28  --  24  GLUCOSE 113*  --  114*  BUN 9  --  8  CREATININE 0.89  --  0.85  CALCIUM 7.6*  --  8.5  MG  --  1.9 2.0   Liver Function Tests:  Recent Labs  11/09/12 0230  AST 15  ALT 19  ALKPHOS 31*  BILITOT 0.9  PROT 6.0  ALBUMIN 3.5   No results found for this basename: LIPASE, AMYLASE,  in the last 72 hours CBC:  Recent Labs  11/09/12 0230 11/10/12 0435  WBC 5.8 5.2  NEUTROABS 3.8  --   HGB 14.2 14.5  HCT 39.9 41.2  MCV 82.6 84.1  PLT 108* 123*   Cardiac Enzymes:  Recent Labs  11/08/12 2034 11/09/12 0230 11/09/12 0757  TROPONINI <0.30 <0.30 <0.30   BNP: No components found with this basename: POCBNP,  D-Dimer: No results found for this basename: DDIMER,  in  the last 72 hours Hemoglobin A1C: No results found for this basename: HGBA1C,  in the last 72 hours Fasting Lipid Panel:  Recent Labs  11/09/12 0230  CHOL 103  HDL 23*  LDLCALC 22  TRIG 289*  CHOLHDL 4.5   Thyroid Function Tests:  Recent Labs  11/09/12 1151  TSH 2.816   Anemia Panel: No results found for this basename: VITAMINB12, FOLATE, FERRITIN, TIBC, IRON, RETICCTPCT,  in the last 72 hours  RADIOLOGY: No results found.  ASSESSMENT AND PLAN:  1. Chest pain/ CAD Resolved Cardiac CT results are pending  2. Palpitations/ afib Risks, benefits, and alternatives to this procedure are discussed with the patient who wishes to proceed with LINQ implant in am.  He wishes to proceed at this time.  3. HTN Stable No change required today  4. Anticoagulation Goal INR 2-3  5. Hypomagnesemia/ hypokalemia replete  Disposition pending results of cardiac CT Possibly home later today  Thompson Grayer, MD 11/10/2012 7:34 AM   Addendum: Cardiac CT reviewed with Dr Meda Coffee.  He had 50% LAD stenosis after D1 on cath 2011.  Dr Meda Coffee  is concerned about the appearance of soft plaque in this area now which she has characterized as 70% with haziness and possibly more acutely changed.  She would recommend cath for further evaluation of this in the setting of acute symptoms worrisome for ischemia.  Risks, benefits, and alternatives to left heart cath with possible PCI were discussed at length with the patient and his spouse who wish to proceed.  Disposition pending results of cath.

## 2012-11-10 NOTE — Progress Notes (Signed)
ANTICOAGULATION CONSULT NOTE - Initial Consult  Pharmacy Consult for warfarin Indication: Afib  Allergies  Allergen Reactions  . Penicillins     REACTION: rash, welps    Patient Measurements: Height: 6' (182.9 cm) Weight: 250 lb (113.399 kg) IBW/kg (Calculated) : 77.6 Heparin Dosing Weight: 102 KG  Vital Signs: Temp: 97.5 F (36.4 C) (10/17 0530) BP: 130/80 mmHg (10/17 0957) Pulse Rate: 64 (10/17 0957)  Labs:  Recent Labs  11/08/12 1516 11/08/12 2034 11/09/12 0230 11/09/12 0757 11/09/12 0935 11/10/12 0435  HGB 14.6  --  14.2  --   --  14.5  HCT 41.2  --  39.9  --   --  41.2  PLT 121*  --  108*  --   --  123*  LABPROT 19.7*  --  20.0*  --   --  19.5*  INR 1.72*  --  1.76*  --   --  1.70*  HEPARINUNFRC  --   --  <0.10*  --  0.18*  --   CREATININE 0.95  --  0.89  --   --  0.85  TROPONINI  --  <0.30 <0.30 <0.30  --   --     Estimated Creatinine Clearance: 118.6 ml/min (by C-G formula based on Cr of 0.85).   Medical History: Past Medical History  Diagnosis Date  . Paroxysmal a-fib     a. S/P PVI 2/11 and 9/11  . SVT (supraventricular tachycardia)     a. Atypical AVNRT of the slow pathway  . GERD (gastroesophageal reflux disease)   . Nephrolithiasis   . Dyslipidemia   . Hypertension   . CAD (coronary artery disease)     a. S/P PCI mid LAD (vision stent) 08/31/2004;  b. repeat cath 6/11 - no progression of CAD  . OSA (obstructive sleep apnea)     a. cpap noncompliance  . COPD (chronic obstructive pulmonary disease)   . Hypothyroidism   . Type II diabetes mellitus   . ECXFQHKU(575.0)     "sometimes weekly" (11/08/2012)  . DJD (degenerative joint disease) of lumbar spine   . Arthritis     "in my back and right leg" (11/08/2012)  . Chronic lower back pain   . On home oxygen therapy     "2L q hs" (11/08/2012)    Assessment: 62 year old man on chronic warfarin for AFIB to continue on warfarin while hospitalized. Will proceed with cardiac CT to evaluate for  progressive CAD, s/p loop recorder implantation. INR 1.7 this morning, hgb 14.5, stable, plt 123 improved (chronically low 100-150K), No bleeding noted per chart.  Coumadin PTA dose - 7.5 on Mon and Thurs; 35m on other days  Goal of Therapy:  INR 2.0 - 3.0 Monitor platelets by anticoagulation protocol: Yes   Plan:  Warfarin 7.534mx 1 dose today F/u daily PT/ INR  MeMaryanna ShapePharmD, BCPS  Clinical Pharmacist  Pager: [phone] 11/10/2012,10:49 AM

## 2012-11-10 NOTE — Op Note (Signed)
SURGEON:  Thompson Grayer, MD     PREPROCEDURE DIAGNOSIS:  Palpitations, atrial fibrillation    POSTPROCEDURE DIAGNOSIS:  Palpitations, atrial fibrillation      PROCEDURES:   1. Implantable loop recorder implantation    INTRODUCTION:  Steve Anthony is a 62 y.o. male with a history of afib and palpitations s/p afib ablation x 2 who presents today for implantable loop implantation.  The patient has recent increase in palpitations.   His afib burden has been difficult to determine post ablation despite event monitor management. The patient therefore presents today for implantable loop implantation.     DESCRIPTION OF PROCEDURE:  Informed written consent was obtained, and the patient was brought to the electrophysiology lab in a fasting state.  The patient required no sedation for the procedure today.  Mapping over the patient's chest was performed by the EP lab staff to identify the area where electrograms were most prominent for ILR recording.  This area was found to be the left parasternal region over the 3rd-4th intercostal space. The patients left chest was therefore prepped and draped in the usual sterile fashion by the EP lab staff. The skin overlying the left parasternal region was infiltrated with lidocaine for local analgesia.  A 0.5-cm incision was made over the left parasternal region over the 3rd intercostal space.  A subcutaneous ILR pocket was fashioned using a combination of sharp and blunt dissection.  A Medtronic Reveal [place] model [id number] implantable loop recorder was then placed into the pocket  R waves were very prominent and measured 0.59m.  Steri- Strips and a sterile dressing were then applied.  There were no early apparent complications.     CONCLUSIONS:   1. Successful implantation of a [employer] Reveal LINQ implantable loop recorder for afib management  2. No early apparent complications.

## 2012-11-10 NOTE — CV Procedure (Signed)
      Cardiac Catheterization Operative Report  Steve Anthony [id number] 10/17/20142:47 PM [name], MD  Procedure Performed:  1. Left Heart Catheterization 2. Selective Coronary Angiography 3. Left ventricular pressures   Operator: Lauree Chandler, MD  Arterial access site:  Right radial artery.   Indication:  62 yo male with history of CAD, atrial fibrillation admitted with palpitations with associated dyspnea and chest pain. Ruled out for MI with normal troponin. He had a loop recorder implanted today. Coronary CTA showed moderate mid LAD stenosis. Cardiac cath today to exclude obstructive CAD.                                    Procedure Details: The risks, benefits, complications, treatment options, and expected outcomes were discussed with the patient. The patient and/or family concurred with the proposed plan, giving informed consent. The patient was brought to the cath lab after IV hydration was begun and oral premedication was given. The patient was further sedated with Versed and Fentanyl. The right wrist was assessed with an Allens test which was positive. The right wrist was prepped and draped in a sterile fashion. 1% lidocaine was used for local anesthesia. Using the modified Seldinger access technique, a 5/6 French sheath was placed in the right radial artery. 3 mg Verapamil was given through the sheath. 5000 units IV heparin was given. Standard diagnostic catheters were used to perform selective coronary angiography. A pigtail catheter was used to measure LV pressures. The sheath was removed from the right radial artery and a Terumo hemostasis band was applied at the arteriotomy site on the right wrist.    There were no immediate complications. The patient was taken to the recovery area in stable condition.   Hemodynamic Findings: Central aortic pressure: 168/98 Left ventricular pressure: 162/11/16  Angiographic Findings:  Left main: Aneurysmal segment at  distal portion of vessel. Unchanged from last cath. No obstructive disease.   Left Anterior Descending Artery: Large caliber vessel that courses to the apex. There are two small caliber diagonal branches. The proximal segment of the LAD was aneurysmal. The mid LAD just beyond the diagonal branch has a focal 50% stenosis, unchanged from last cath in 2011. The stent in the mid LAD is patent without restenosis. The distal vessel has mild plaque disease.   Circumflex Artery: Large caliber vessel with aneurysmal proximal segment. The first obtuse marginal branch is bifurcating vessel with mild plaque disease. The distal AV groove Circumflex has diffuse 30% stenosis.   Right Coronary Artery: Large dominant vessel with proximal 30% stenosis, serial 20% stenoses in the mid and distal vessel.   Left Ventricular Angiogram: Deferred.   Impression: 1. Stable single vessel CAD with patent stent mid LAD, stable moderate stenosis mid LAD  Recommendations: Continued medical management of CAD. Discharge home today after bedrest.        Complications:  None. The patient tolerated the procedure well.

## 2012-11-10 NOTE — Interval H&P Note (Signed)
History and Physical Interval Note:  11/10/2012 2:08 PM  Steve Anthony  has presented today for cardiac cath with the diagnosis of cp, known CAD, ? Progression of CAD on CTA today.   The various methods of treatment have been discussed with the patient and family. After consideration of risks, benefits and other options for treatment, the patient has consented to  Procedure(s): LEFT HEART CATHETERIZATION WITH CORONARY ANGIOGRAM (N/A) as a surgical intervention .  The patient's history has been reviewed, patient examined, no change in status, stable for surgery.  I have reviewed the patient's chart and labs.  Questions were answered to the patient's satisfaction.    Cath Lab Visit (complete for each Cath Lab visit)  Clinical Evaluation Leading to the Procedure:   ACS: no  Non-ACS:    Anginal Classification: CCS I  Anti-ischemic medical therapy: Minimal Therapy (1 class of medications)  Non-Invasive Test Results: No non-invasive testing performed  Prior CABG: No previous CABG        Steve Anthony

## 2012-11-10 NOTE — Discharge Summary (Signed)
Discharge Summary   Patient ID: Steve Anthony MRN: [id number], DOB/AGE: [birth year] 61 y.o. Admit date: 11/08/2012 D/C date:     11/10/2012  Primary Cardiologist: Allred  Primary Discharge Diagnoses:  1. Palpitations - s/p LINQ loop recorder implantation this admission 2. Chest pain, may be related to #1, see #3 3. CAD - cath this admission stable from prior - history of PCI/Vision stent to mLAD 08/2004 4. Paroxysmal atrial fibrillation s/p catheter ablation x 2 5. Severe hypomagnesemia 6. Hypokalemia  Secondary Discharge Diagnoses:  1. 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 GERD 3. Nephrolithiasis  4. Dyslipidemia  5. Hypertension 6. OSA - cpap noncompliance  7. COPD, on home O2 therapy 2L QHS 8. Hyperthyroidism 9. Type 2 diabetes mellitus 10. Headache 11. DJD 12. Arthritis 13. Chronic low back pain  Hospital Course:  Mr. Uher is a 62 y/o M with history of CAD s/p stent to LAD 2006, PAF, SVT, and GERD who presented to [facility] with palpitations and associated chest pain. Most recent catheterization prior to this admission in 2011 showed nonobstructive disease. His prior PAF has been historically associated with chest pain, thus he has undergone pulmonary venous isolation and ablation by Dr. Rayann Heman x2 in 2011. He is chronically maintained on Coumadin. Over the past 2 months, he has noticed intermittent tachypalpitations associated with substernal chest squeezing along with intermittent associated dyspnea, lightheadedness, and diaphoresis. Symptoms typically last 20 minutes to hours at a time and resolve spontaneously.  Over the past 2 weeks, he has had increasing frequency of both palpitations and chest discomfort, always occurring together, and always resolving at the same time. He was called by our office for an upcoming appointment reminder and he mentioned his symptoms upon speaking with them. He was advised to come to the ER. Here, he was in NSR and was  CP free. ECG was nonacute and initial troponin was negative. He had marked hypomagnesemia at 0.7 and hypokalemia at 3.0 which were repleted. He received 6g IV magnesium while inpatient and oral potassium. He was admitted for further evaluation.  INR was slightly subtherapeutic thus heparin was started while he was ruled out. Troponins remained negative. TSH/free T4 were normal. His telemetry remained unremarkable. In an effort to assess afib burden and better characterize palpitations, LINQ loop recorder implantation was recommended. He underwent this on 11/10/12. Cardiac CT was performed to assess for progression of CAD as well as to assess PV anatomy as a cause for his symptoms post-ablation. There was no PV stenosis noted on report. He did have moderate prox LAD plaque, thus cardiac cath was recommended to clarify degree. He underwent this procedure today which showed no change from prior cath. Low dose aspirin will be continued. The patient tolerated the procedure well. Dr. Rayann Heman has seen and examined the patient today and feels he is stable for discharge.  The patient will have a 10 day wound check, 4 week f/u, and will also have his INR checked on Monday in Dr. Willette Pa office given subtherapeutic value on admission. He states he does not typically need an appointment for this and can freely walk in to have this done. He will hold Metformin x 48 hrs. Given hypomagnesemia and hypokalemia, MagOx and KCl were prescribed at discharge. He will have repeat BMET/Mg at time of his wound check.  Discharge Vitals: Blood pressure 130/80, pulse 57, temperature 97.5 F (36.4 C), temperature source Oral, resp. rate 20, height 6' (1.829 m), weight 241  lb (109.317 kg), SpO2 97.00%.  Labs: Lab Results  Component Value Date   WBC 5.2 11/10/2012   HGB 14.5 11/10/2012   HCT 41.2 11/10/2012   MCV 84.1 11/10/2012   PLT 123* 11/10/2012     Recent Labs Lab 11/09/12 0230 11/10/12 0435  NA 138 140  K 3.0* 3.9    CL 98 105  CO2 28 24  BUN 9 8  CREATININE 0.89 0.85  CALCIUM 7.6* 8.5  PROT 6.0  --   BILITOT 0.9  --   ALKPHOS 31*  --   ALT 19  --   AST 15  --   GLUCOSE 113* 114*    Recent Labs  11/08/12 2034 11/09/12 0230 11/09/12 0757  TROPONINI <0.30 <0.30 <0.30   Lab Results  Component Value Date   CHOL 103 11/09/2012   HDL 23* 11/09/2012   LDLCALC 22 11/09/2012   TRIG 289* 11/09/2012     Diagnostic Studies/Procedures   Cardiac catheterization this admission, please see full report and above for summary.  Ct Cardiac Morph/pulm Vein W/cm&w/o Ca Score  11/10/2012   ADDENDUM REPORT: 11/10/2012 11:00  ADDENDUM: Coronary CT:  Clinical: 61 yo patient with h/o mid LAD stent and paroxysmal atrial fibrillation, s/p 2 ablations, presenting with palpitations and chest pain.  Evaluate for significant coronary artery stenosis and possible pulmonary vein stenosis.  Left main: The left main artery is a large caliber vessel that gives off to LAD and LCx with minimal plague.  LAD: LAD is a moderate caliber vessel. The proximal portion has calcified plague leading to mild 25-50% narrowing. Shortly after take off of the first diagonal vessel and the first septal perforator there is a long segment of mixed plague predominantly non-calcified that with moderate 50-70%narrowing. This plague has high risk features, including microcalcification.  After this lesion there is a stent in the mid portion of LAD with no significant in-stent restenosis. Distal LAD is a small caliber vessel with mild luminal irregularities.  D1: is a small vessel with mild ostial lesion causing 25-50%stenosis.  D2 is a very small vessel with no significant stenosis.  LCx: Left circumflex is a moderate caliber non-dominant vessel that gives rise to 2 obtuse marginal branches.  There is a long segment of calcified plague in the proximal LCx causing 25-50% stenosis. Mid and distal LCx only has minimal atherosclerotic plague.  OM1 - is a  small caliber vessel with mild luminal irregularities.  OM2 - is a very small vessel with mild luminal irregularities.  RCA - RCA is a dominant vessel of large caliber , that gives rise to PDA and PLVB.  Ostial RCA has mild 0-25% noncalcified plague. Proximal RCA has calcified plague with 0-25% luminal stenosis. Mid RCA has calcified plague with 0-25% luminal stenosis. PDA has luminal irregularities. PLVB is a small vessel with luminal irregularities.  There are 4 pulmonary veins originating in a normal position from the left atrium. The measurements are as follows:  RUPV: 23 x 17 mm  RLPV: 18 x 16 mm  LUPV: 22 x 12 mm  LLPV: 15 x 14 mm  Left atrial appendage is large, tortuous without obvious filling defect.  Impression: Moderate proximal LAD plague causing 50-70% luminal stenosis with high risk features of predominantly non-calcified plague. Cardiac cath is recommended.  The results were discussed with Dr Thompson Grayer.   Electronically Signed   By: Ena Dawley   On: 11/10/2012 11:00   11/10/2012   EXAM: OVER-READ INTERPRETATION  CT CHEST  The following report is an over-read performed by radiologist Dr. Rebekah [facility] on 11/09/2012. This over-read does not include interpretation of cardiac or coronary anatomy or pathology. The coronary calcium score interpretation by the cardiologist is attached.  COMPARISON:  CT of the chest 03/25/2010.  FINDINGS: Calcified granuloma in the posterior aspect of the right lower lobe. Mild emphysematous changes discretion Mild centrilobular emphysematous changes noted in the lungs. Mild diffuse bronchial wall thickening. Within the visualized portions of the thorax there is no acute consolidative airspace disease, and no definite pleural effusion and no definite suspicious appearing pulmonary nodule or mass. Visualized portions of the upper abdomen demonstrate diffuse low attenuation throughout the hepatic parenchyma, suggestive of hepatic  steatosis. There are no aggressive appearing lytic or blastic lesions noted in the visualized portions of the skeleton.  IMPRESSION: 1. No significant acute extracardiac findings on today's examination. 2. Hepatic steatosis. 3. Mild diffuse bronchial wall thickening with mild centrilobular emphysema; imaging findings suggestive of mild COPD.  Electronically Signed: By: Vinnie Langton M.D. On: 11/09/2012 17:24    Discharge Medications     Medication List         aspirin 81 MG EC tablet  Take 1 tablet (81 mg total) by mouth daily.     atorvastatin 40 MG tablet  Commonly known as:  LIPITOR  Take 40 mg by mouth at bedtime.     azithromycin 250 MG tablet  Commonly known as:  ZITHROMAX  Take 250-500 mg by mouth daily. Zpk. Takes 585m day 1, then 2525mdays 2-5     cloNIDine 0.2 MG tablet  Commonly known as:  CATAPRES  Take 0.2-0.4 mg by mouth 3 (three) times daily. Takes 0.83m74mn the morning, 0.4mg33m lunch, and 0.4mg 75mdinner     COMBIVENT 18-103 MCG/ACT inhaler  Generic drug:  albuterol-ipratropium  Inhale 2 puffs into the lungs every 4 (four) hours as needed for wheezing or shortness of breath.     fenofibrate 160 MG tablet  Take 160 mg by mouth daily.     ferrous sulfate 325 (65 FE) MG EC tablet  Take 325 mg by mouth daily with breakfast.     furosemide 20 MG tablet  Commonly known as:  LASIX  Take 20 mg by mouth daily as needed for fluid.     HYDROcodone-acetaminophen 7.5-500 MG per tablet  Commonly known as:  LORTAB  Take 1 tablet by mouth every 6 (six) hours as needed for pain.     losartan 50 MG tablet  Commonly known as:  COZAAR  Take 50 mg by mouth daily.     magnesium oxide 400 (241.3 MG) MG tablet  Commonly known as:  MAG-OX  Take 1 tablet (400 mg total) by mouth 2 (two) times daily.     metFORMIN 500 MG 24 hr tablet  Commonly known as:  GLUCOPHAGE-XR  Take 2 tablets (1,000 mg total) by mouth 2 (two) times daily.  Start taking on:  11/13/2012      methimazole 5 MG tablet  Commonly known as:  TAPAZOLE  Take 2.5 mg by mouth daily.     metoprolol succinate 50 MG 24 hr tablet  Commonly known as:  TOPROL-XL  Take 50 mg by mouth daily. Take with or immediately following a meal.     nitroGLYCERIN 0.4 MG SL tablet  Commonly known as:  NITROSTAT  Place 0.4 mg under the tongue every 5 (five) minutes as needed. May repeat up to 3 doses.  pantoprazole 40 MG tablet  Commonly known as:  PROTONIX  Take 40 mg by mouth daily.     Potassium Chloride ER 20 MEQ Tbcr  Take 2 tablets (40 mEq total) by mouth daily.     tamsulosin 0.4 MG Caps capsule  Commonly known as:  FLOMAX  Take 0.4 mg by mouth daily.     warfarin 5 MG tablet  Commonly known as:  COUMADIN  Take 5-7.5 mg by mouth daily. Takes 7.68m on Sunday and Thursday. Takes 550mall other days     zolpidem 10 MG tablet  Commonly known as:  AMBIEN  Take 10 mg by mouth at bedtime.        Disposition   The patient will be discharged in stable condition to home. Discharge Orders   Future Appointments Provider Department Dept Phone   11/22/2012 7:50 AM [facility] [place] [phone] 11/22/2012 3:30 PM [facility] [address] [place] [phone] 12/08/2012 12:00 PM BrAndrez GrimePA-C [phone] Future Orders Complete By Expires   Diet - low sodium heart healthy  As directed    Discharge instructions  As directed    Comments:     Do not restart Metformin until the morning of 11/13/12.   Increase activity slowly  As directed    Comments:     No driving for 2 days. No lifting over 5 lbs for 1 week. No sexual activity for 1 week. Keep procedure site clean & dry. If you notice increased pain, swelling, bleeding or pus, call/return!  You may shower, but no soaking baths/hot tubs/pools for 1 week. If you develop any cath site problems before your follow-up appointment, please call your doctor.      Follow-up Information   [facility] check for your loop recorder site - 11/22/12 at 3:30pm. We will recheck your electrolytes at that time.)    Specialty:  Cardiology   Contact information:   [address] [place] [phone]    Follow up with EDIleene HutchinsonPA-C. (Ms. EdFransico Hims one of Dr. AlJackalyn LombardAs [place] 12/08/12 at 12pm.)    Specialty:  Cardiology   Contact information:   [address] [phone]     Follow up with GRGilford RileMD. (Please have your Coumadin level checked on Monday 11/13/12.)    Specialty:  Internal Medicine   Contact information:   [address] [phone]       Duration of Discharge Encounter: Greater than 30 minutes including physician and PA time.  Signed, DaMelina CopaA-C 11/10/2012, 3:34 PM

## 2012-11-10 NOTE — Progress Notes (Signed)
Pt given discharge instructions with understanding and has no questions at this time. Wife and pt sister at bedside. Right radial site without bleeding or hematoma. Left chest incision old drainage/marked.  Monitor d/c and iv d/c. Pt wheeled out by staff.

## 2012-11-10 NOTE — Discharge Summary (Signed)
See full note by Dr. Rayann Heman. Full cath note. cdm

## 2012-11-10 NOTE — H&P (View-Only) (Signed)
SUBJECTIVE: The patient is doing well today.  No further palpitations  At this time, he denies chest pain, shortness of breath, or any new concerns.  [facility] HOLD] aspirin EC  81 mg Oral Daily  . [MAR HOLD] atorvastatin  40 mg Oral QHS  . [facility] HOLD] azithromycin  250 mg Oral Daily  . [facility] HOLD] cloNIDine  0.2 mg Oral TID  . [facility] HOLD] fenofibrate  160 mg Oral Daily  . [facility] HOLD] ferrous sulfate  325 mg Oral Q breakfast  . [MAR HOLD] insulin aspart  0-15 Units Subcutaneous TID WC  . [MAR HOLD] losartan  50 mg Oral Daily  . [facility] HOLD] methimazole  2.5 mg Oral Daily  . [facility] HOLD] metoprolol succinate  50 mg Oral Daily  . [MAR HOLD] pantoprazole  40 mg Oral Daily  . [facility] HOLD] potassium chloride  40 mEq Oral BID  . [facility] HOLD] tamsulosin  0.4 mg Oral Daily  . Mikaela[facility]Ping HOLD] Warfarin - Pharmacist Dosing Inpatient   Does not apply q1800  . [facility] HOLD] zolpidem  10 mg Oral QHS      OBJECTIVE: Physical Exam: Filed Vitals:   11/09/12 1540 11/09/12 1545 11/09/12 2005 11/10/12 0530  BP:  152/99 122/78 159/99  Pulse: 69 74 66 60  Temp:   97.9 F (36.6 C) 97.5 F (36.4 C)  TempSrc:      Resp:   18 20  Height:      Weight:      SpO2:   96% 97%    Intake/Output Summary (Last 24 hours) at 11/10/12 0734 Last data filed at 11/09/12 2200  Gross per 24 hour  Intake    440 ml  Output    800 ml  Net   -360 ml    Telemetry reveals sinus rhythm   GEN- The patient is well appearing, alert and oriented x 3 today.   Head- normocephalic, atraumatic Eyes-  Sclera clear, conjunctiva pink Ears- hearing intact Oropharynx- clear Neck- supple, no JVP Lymph- no cervical lymphadenopathy Lungs- Clear to ausculation bilaterally, normal work of breathing Heart- Regular rate and rhythm, no murmurs, rubs or gallops, PMI not laterally displaced GI- soft, NT, ND, + BS Extremities- no clubbing, cyanosis, or edema Skin- no rash or lesion Psych- euthymic mood, full affect Neuro- strength and sensation are  intact  LABS: Basic Metabolic Panel:  Recent Labs  11/09/12 0230 11/09/12 0935 11/10/12 0435  NA 138  --  140  K 3.0*  --  3.9  CL 98  --  105  CO2 28  --  24  GLUCOSE 113*  --  114*  BUN 9  --  8  CREATININE 0.89  --  0.85  CALCIUM 7.6*  --  8.5  MG  --  1.9 2.0   Liver Function Tests:  Recent Labs  11/09/12 0230  AST 15  ALT 19  ALKPHOS 31*  BILITOT 0.9  PROT 6.0  ALBUMIN 3.5   No results found for this basename: LIPASE, AMYLASE,  in the last 72 hours CBC:  Recent Labs  11/09/12 0230 11/10/12 0435  WBC 5.8 5.2  NEUTROABS 3.8  --   HGB 14.2 14.5  HCT 39.9 41.2  MCV 82.6 84.1  PLT 108* 123*   Cardiac Enzymes:  Recent Labs  11/08/12 2034 11/09/12 0230 11/09/12 0757  TROPONINI <0.30 <0.30 <0.30   BNP: No components found with this basename: POCBNP,  D-Dimer: No results found for this basename: DDIMER,  in  the last 72 hours Hemoglobin A1C: No results found for this basename: HGBA1C,  in the last 72 hours Fasting Lipid Panel:  Recent Labs  11/09/12 0230  CHOL 103  HDL 23*  LDLCALC 22  TRIG 289*  CHOLHDL 4.5   Thyroid Function Tests:  Recent Labs  11/09/12 1151  TSH 2.816   Anemia Panel: No results found for this basename: VITAMINB12, FOLATE, FERRITIN, TIBC, IRON, RETICCTPCT,  in the last 72 hours  RADIOLOGY: No results found.  ASSESSMENT AND PLAN:  1. Chest pain/ CAD Resolved Cardiac CT results are pending  2. Palpitations/ afib Risks, benefits, and alternatives to this procedure are discussed with the patient who wishes to proceed with LINQ implant in am.  He wishes to proceed at this time.  3. HTN Stable No change required today  4. Anticoagulation Goal INR 2-3  5. Hypomagnesemia/ hypokalemia replete  Disposition pending results of cardiac CT Possibly home later today  Thompson Grayer, MD 11/10/2012 7:34 AM   Addendum: Cardiac CT reviewed with Dr Meda Coffee.  He had 50% LAD stenosis after D1 on cath 2011.  Dr Meda Coffee  is concerned about the appearance of soft plaque in this area now which she has characterized as 70% with haziness and possibly more acutely changed.  She would recommend cath for further evaluation of this in the setting of acute symptoms worrisome for ischemia.  Risks, benefits, and alternatives to left heart cath with possible PCI were discussed at length with the patient and his spouse who wish to proceed.  Disposition pending results of cath.

## 2012-11-11 ENCOUNTER — Encounter (HOSPITAL_COMMUNITY): Payer: Self-pay | Admitting: *Deleted

## 2012-11-22 ENCOUNTER — Other Ambulatory Visit (INDEPENDENT_AMBULATORY_CARE_PROVIDER_SITE_OTHER): Payer: Medicare PPO

## 2012-11-22 ENCOUNTER — Encounter: Payer: Self-pay | Admitting: Internal Medicine

## 2012-11-22 ENCOUNTER — Ambulatory Visit (INDEPENDENT_AMBULATORY_CARE_PROVIDER_SITE_OTHER): Payer: Medicare PPO | Admitting: *Deleted

## 2012-11-22 DIAGNOSIS — I4891 Unspecified atrial fibrillation: Secondary | ICD-10-CM

## 2012-11-22 DIAGNOSIS — E876 Hypokalemia: Secondary | ICD-10-CM

## 2012-11-22 LAB — BASIC METABOLIC PANEL
BUN: 11 mg/dL (ref 6–23)
CO2: 29 mEq/L (ref 19–32)
Calcium: 9.5 mg/dL (ref 8.4–10.5)
Creatinine, Ser: 1.1 mg/dL (ref 0.4–1.5)
GFR: 73.01 mL/min (ref >60.00)
Sodium: 137 mEq/L (ref 135–145)

## 2012-11-22 LAB — MAGNESIUM: Magnesium: 1.3 mg/dL — ABNORMAL LOW (ref 1.5–2.5)

## 2012-11-22 LAB — PACEMAKER DEVICE OBSERVATION

## 2012-11-22 NOTE — Progress Notes (Signed)
Loop check in clinic.  Pt with 0 tachy episodes; 0 brady episodes; 0 asystole. ROV in November with Ileene Hutchinson, [place]

## 2012-12-07 ENCOUNTER — Encounter: Payer: Self-pay | Admitting: Cardiology

## 2012-12-08 ENCOUNTER — Encounter: Payer: Medicare PPO | Admitting: Cardiology

## 2012-12-12 ENCOUNTER — Encounter: Payer: Self-pay | Admitting: Internal Medicine

## 2012-12-12 ENCOUNTER — Encounter: Payer: Self-pay | Admitting: Cardiology

## 2012-12-12 ENCOUNTER — Encounter (INDEPENDENT_AMBULATORY_CARE_PROVIDER_SITE_OTHER): Payer: Self-pay

## 2012-12-12 ENCOUNTER — Ambulatory Visit (INDEPENDENT_AMBULATORY_CARE_PROVIDER_SITE_OTHER): Payer: Medicare PPO | Admitting: Cardiology

## 2012-12-12 VITALS — BP 136/98 | HR 71 | Ht 72.0 in | Wt 252.0 lb

## 2012-12-12 DIAGNOSIS — E876 Hypokalemia: Secondary | ICD-10-CM

## 2012-12-12 DIAGNOSIS — R002 Palpitations: Secondary | ICD-10-CM

## 2012-12-12 DIAGNOSIS — I4891 Unspecified atrial fibrillation: Secondary | ICD-10-CM

## 2012-12-12 DIAGNOSIS — I10 Essential (primary) hypertension: Secondary | ICD-10-CM

## 2012-12-12 LAB — MDC_IDC_ENUM_SESS_TYPE_INCLINIC
Date Time Interrogation Session: 20141118130816
Zone Setting Detection Interval: 2000 ms
Zone Setting Detection Interval: 3000 ms
Zone Setting Detection Interval: 360 ms

## 2012-12-12 NOTE — Patient Instructions (Addendum)
Your physician recommends that you schedule a follow-up appointment in: 3 MONTHS WITH DR. ALLRED  Your physician recommends that you return for lab work in: [place] physician recommends that you continue on your current medications as directed. Please refer to the Current Medication list given to you today.

## 2012-12-12 NOTE — Progress Notes (Signed)
ELECTROPHYSIOLOGY OFFICE NOTE  Patient ID: Steve Anthony MRN: [id number], DOB/AGE: [birth year]   Date of Visit: 12/12/2012  Primary Physician: [name], MD Primary Cardiologist: Thompson Grayer, MD Reason for Visit: Hospital follow-up  History of Present Illness  Steve Anthony is a 62 y.o. male with CAD s/p stent to LAD 2006, PAF, SVT, and GERD who presented to [facility] with palpitations and associated chest pain. Most recent catheterization prior to this admission in 2011 showed nonobstructive disease. His prior PAF has been historically associated with chest pain, thus he has undergone pulmonary venous isolation and ablation by Dr. Rayann Heman x2 in 2011. He is chronically maintained on Coumadin. Over the past 2 months, he has noticed intermittent tachypalpitations associated with chest pain, dyspnea, lightheadedness and diaphoresis. He called our office and was advised to come to the ED. On arrival, he was in NSR and CP free. ECG was nonacute and initial troponin was negative. He had marked hypomagnesemia at 0.7 and hypokalemia at 3.0 which were repleted. He received 6g IV magnesium while inpatient and oral potassium. He was admitted for further evaluation. Troponins remained negative. TSH/free T4 were normal. His telemetry remained unremarkable. In an effort to assess AF burden and better characterize palpitations, a LINQ loop recorder was implanted. Cardiac CT was performed to assess for progression of CAD as well as to assess PV anatomy as a possible cause for his symptoms post-ablation. There was no PV stenosis. He did have moderate prox LAD plaque, thus cardiac cath was recommended to clarify degree. He underwent this procedure today which showed no change from prior cath. Low dose aspirin was continued. He was discharged on 11/10/2012. He presents today for hospital follow-up.   Since discharge, he reports he is doing well and has no complaints. He denies chest pain or shortness of  breath. He denies palpitations, dizziness, near syncope or syncope. He denies LE swelling, orthopnea, PND or recent weight gain. He is compliant and tolerating medications without difficulty.  Past Medical History Past Medical History  Diagnosis Date  . Paroxysmal a-fib     a. S/P PVI 2/11 and 9/11  . SVT (supraventricular tachycardia)     a. Atypical AVNRT of the slow pathway  . GERD (gastroesophageal reflux disease)   . Nephrolithiasis   . Dyslipidemia   . Hypertension   . CAD (coronary artery disease)     a. S/P PCI mid LAD (vision stent) 08/31/2004;  b. repeat cath 6/11 - no progression of CAD. c. Cath 10/2012 given moderate CAD on cardiac CT - no change from prior cath.  . OSA (obstructive sleep apnea)     a. cpap noncompliance  . COPD (chronic obstructive pulmonary disease)   . Hyperthyroidism   . Type II diabetes mellitus   . MKLKJZPH(150.5)     "sometimes weekly" (11/08/2012)  . DJD (degenerative joint disease) of lumbar spine   . Arthritis     "in my back and right leg" (11/08/2012)  . Chronic lower back pain   . On home oxygen therapy     "2L q hs" (11/08/2012)  . Palpitations     a. 10/2012 - s/p LINQ loop recorder to assess for arrhythmia.   Marland Kitchen Hypomagnesemia     a. 10/2012 - Mg 0.7.  . Hypokalemia     Past Surgical History Past Surgical History  Procedure Laterality Date  . Lumbar disc surgery  2000; 2002  . Atrial ablation surgery  2007    S/P slow pathway ablation  for atypical AVNRT   . Ep study  2008    Negative EP study in [place]  . Pvi/ cti ablation  02/25/2009 and 09/2009    S/P afib/ CTI ablation  . Tibia fracture surgery Right 1990    "broke in 3 places; had rod put in" (11/08/2012)  . Tibia hardware removal Right ~ 1991  . Coronary angioplasty with stent placement  08/30/2004    Vision; to LAD   . Posterior lumbar fusion  2004  . Tonsillectomy  1965  . Cholecystectomy  ~ 2008  . Fracture surgery    . Prostate surgery  ~ 2009  . Cystoscopy w/  stone manipulation  ~ 1998    "once" (11/08/2012)  . Loop recorder implant  11/10/2012    Medtronic LinQ implanted by Dr Rayann Heman for afib management    Allergies/Intolerances Allergies  Allergen Reactions  . Penicillins     REACTION: rash, welps    Current Home Medications Current Outpatient Prescriptions  Medication Sig Dispense Refill  . albuterol-ipratropium (COMBIVENT) 18-103 MCG/ACT inhaler Inhale 2 puffs into the lungs every 4 (four) hours as needed for wheezing or shortness of breath.       Marland Kitchen aspirin EC 81 MG EC tablet Take 1 tablet (81 mg total) by mouth daily.      Marland Kitchen atorvastatin (LIPITOR) 40 MG tablet Take 40 mg by mouth at bedtime.       . cloNIDine (CATAPRES) 0.2 MG tablet Take 0.2-0.4 mg by mouth 3 (three) times daily. Takes 0.58m in the morning, 0.470mat lunch, and 0.61m42mt dinner      . cyanocobalamin 1000 MCG tablet Take 100 mcg by mouth daily.      . fenofibrate 160 MG tablet Take 160 mg by mouth daily.        . fentaNYL (DURAGESIC) 25 MCG/HR patch Place 25 mcg onto the skin every 3 (three) days.      . ferrous sulfate 325 (65 FE) MG EC tablet Take 325 mg by mouth daily with breakfast.      . furosemide (LASIX) 20 MG tablet Take 20 mg by mouth every other day.       . HMarland KitchenDROcodone-acetaminophen (LORTAB) 7.5-500 MG per tablet Take 1 tablet by mouth every 6 (six) hours as needed for pain.      . lMarland Kitchensartan (COZAAR) 50 MG tablet Take 50 mg by mouth daily.      . magnesium oxide (MAG-OX) 400 (241.3 MG) MG tablet Take 1 tablet (400 mg total) by mouth 2 (two) times daily.  60 tablet  6  . Melatonin 5 MG TABS Take 5 mg by mouth at bedtime.      . metFORMIN (GLUCOPHAGE-XR) 500 MG 24 hr tablet Take 2 tablets (1,000 mg total) by mouth 2 (two) times daily.      . methimazole (TAPAZOLE) 5 MG tablet Take 5 mg by mouth daily.       . metoprolol succinate (TOPROL-XL) 50 MG 24 hr tablet Take 50 mg by mouth 2 (two) times daily. Take with or immediately following a meal.      .  nitroGLYCERIN (NITROSTAT) 0.4 MG SL tablet Place 0.4 mg under the tongue every 5 (five) minutes as needed. May repeat up to 3 doses.       . pantoprazole (PROTONIX) 40 MG tablet Take 40 mg by mouth daily.        . potassium chloride 20 MEQ TBCR Take 2 tablets (40 mEq total) by mouth daily.  60 tablet  6  . Tamsulosin HCl (FLOMAX) 0.4 MG CAPS Take 0.4 mg by mouth daily.        Marland Kitchen warfarin (COUMADIN) 5 MG tablet Take 5-7.5 mg by mouth daily. Takes 7.4m on Sunday and Thursday. Takes 580mall other days      . zolpidem (AMBIEN) 10 MG tablet Take 10 mg by mouth at bedtime.        No current facility-administered medications for this visit.    Social History History   Social History  . Marital Status: Married    Spouse Name: N/A    Number of Children: N/A  . Years of Education: N/A   Occupational History  . DISABLED   . 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 Smoking status: Former Smoker -- 3.00 packs/day for 38 years    Types: Cigarettes    Quit date: 03/25/2004  . Smokeless tobacco: Former [employer] developer  Types: Chew    Quit date: 04/25/2004  . Alcohol Use: Yes     Comment: 11/08/2012 "quit drinking in 1989"  . Drug Use: No  . Sexual Activity: Not Currently   Other Topics Concern  . Not on file   Social History Narrative   [place]   Two children and two grandchildren   Attends [employer]   Review of Systems General: No chills, fever, night sweats or weight changes Cardiovascular: No chest pain, dyspnea on exertion, edema, orthopnea, palpitations, paroxysmal nocturnal dyspnea Dermatological: No rash, lesions or masses Respiratory: No cough, dyspnea Urologic: No hematuria, dysuria Abdominal: No nausea, vomiting, diarrhea, bright red blood per rectum, melena, or hematemesis Neurologic: No visual changes, weakness, changes in mental status All other systems reviewed and are otherwise negative except as noted  above.  Physical Exam Vitals: Blood pressure 158/104, pulse 71, height 6' (1.829 m), weight 252 lb (114.306 kg).  General: Well developed, well appearing [age].o. male in no acute distress. HEENT: Normocephalic, atraumatic. EOMs intact. Sclera nonicteric. Oropharynx clear.  Neck: Supple. No JVD. Lungs: Respirations regular and unlabored, CTA bilaterally. No wheezes, rales or rhonchi. Heart: RRR. S1, S2 present. No murmurs, rub, S3 or S4. Abdomen: Soft, non-distended.   Extremities: No clubbing, cyanosis or edema. PT/Radials 2+ and equal bilaterally. Psych: Normal affect. Neuro: Alert and oriented X 3. Moves all extremities spontaneously.  Skin: ILR implant site intact and well healed.   Diagnostics Recent BMET reviewed from PCP's office 12/07/2012 - potassium 4.3 Device interrogation today - Patient with 1 symptom episode - EGM shows SR. 1 AF episode - EGM shows SR with oversensing. 0 tachy episodes. 0 brady episodes. 0 asystole episodes.   Assessment and Plan 1. Chest pain and palpitations - no arrhythmias on ILR interrogation today 2. PAF s/p AF ablation x 2 in 2011 - no recurrence on ILR interrogation today - continue BB for rate control - continue warfarin for stroke prevention - follow-up with Dr. AlRayann Hemann 3 months 3. Hypomagnesemia - will repeat Mg today 4. Hypokalemia - improved but recent BMET from PCP's office 5. HTN - BP elevated today but Steve Anthony he has not taken all of his AM meds yet - followed by PCP; no changes made today  Signed, EDIleene HutchinsonPA-C 12/12/2012, 12:37 PM

## 2012-12-14 ENCOUNTER — Telehealth: Payer: Self-pay | Admitting: *Deleted

## 2012-12-14 NOTE — Telephone Encounter (Signed)
LMOM for pt to send transmission. No communication in last 34 days.

## 2012-12-14 NOTE — Telephone Encounter (Signed)
Pt does not get any cell service at home. Pt will transmit reports from daughter's house or other locations in/near .  Pt understands he can transmit anywhere as long as there's a power outlet and cell service.

## 2013-01-03 ENCOUNTER — Ambulatory Visit: Payer: Medicare PPO | Admitting: Internal Medicine

## 2013-01-05 ENCOUNTER — Encounter: Payer: Self-pay | Admitting: Internal Medicine

## 2013-01-11 ENCOUNTER — Ambulatory Visit (INDEPENDENT_AMBULATORY_CARE_PROVIDER_SITE_OTHER): Payer: Medicare PPO | Admitting: *Deleted

## 2013-01-11 DIAGNOSIS — I4891 Unspecified atrial fibrillation: Secondary | ICD-10-CM

## 2013-01-12 ENCOUNTER — Other Ambulatory Visit: Payer: Self-pay | Admitting: Internal Medicine

## 2013-02-12 ENCOUNTER — Encounter: Payer: Medicare PPO | Admitting: *Deleted

## 2013-02-12 DIAGNOSIS — I4891 Unspecified atrial fibrillation: Secondary | ICD-10-CM

## 2013-02-13 LAB — MDC_IDC_ENUM_SESS_TYPE_REMOTE

## 2013-02-20 ENCOUNTER — Encounter: Payer: Self-pay | Admitting: Internal Medicine

## 2013-02-22 ENCOUNTER — Encounter: Payer: Self-pay | Admitting: Internal Medicine

## 2013-02-25 HISTORY — PX: COLON RESECTION: SHX5231

## 2013-02-28 LAB — MDC_IDC_ENUM_SESS_TYPE_REMOTE

## 2013-03-14 ENCOUNTER — Ambulatory Visit (INDEPENDENT_AMBULATORY_CARE_PROVIDER_SITE_OTHER): Payer: Medicare PPO | Admitting: *Deleted

## 2013-03-14 DIAGNOSIS — I4891 Unspecified atrial fibrillation: Secondary | ICD-10-CM

## 2013-03-14 DIAGNOSIS — R002 Palpitations: Secondary | ICD-10-CM

## 2013-03-14 LAB — MDC_IDC_ENUM_SESS_TYPE_REMOTE

## 2013-03-16 ENCOUNTER — Encounter: Payer: Self-pay | Admitting: Internal Medicine

## 2013-03-16 ENCOUNTER — Ambulatory Visit (INDEPENDENT_AMBULATORY_CARE_PROVIDER_SITE_OTHER): Payer: Medicare PPO | Admitting: Internal Medicine

## 2013-03-16 ENCOUNTER — Telehealth: Payer: Self-pay | Admitting: Internal Medicine

## 2013-03-16 VITALS — BP 150/92 | HR 91 | Ht 72.0 in | Wt 242.0 lb

## 2013-03-16 DIAGNOSIS — G4730 Sleep apnea, unspecified: Secondary | ICD-10-CM

## 2013-03-16 DIAGNOSIS — I10 Essential (primary) hypertension: Secondary | ICD-10-CM

## 2013-03-16 DIAGNOSIS — I4891 Unspecified atrial fibrillation: Secondary | ICD-10-CM

## 2013-03-16 DIAGNOSIS — I2510 Atherosclerotic heart disease of native coronary artery without angina pectoris: Secondary | ICD-10-CM

## 2013-03-16 LAB — MDC_IDC_ENUM_SESS_TYPE_INCLINIC
MDC IDC SESS DTM: 20150220110151
MDC IDC SET ZONE DETECTION INTERVAL: 360 ms
Zone Setting Detection Interval: 2000 ms
Zone Setting Detection Interval: 3000 ms

## 2013-03-16 NOTE — Telephone Encounter (Signed)
Office note faxed.

## 2013-03-16 NOTE — Progress Notes (Signed)
PCP:  [name], MD  The patient presents today for routine electrophysiology followup.  Since last being seen in our clinic, the patient reports doing very well.  He is not having palpitations at this time.  His SOB is improved.  He is mostly concerned with diverticulosis and planned surgery. Today, he denies symptoms of palpitations, chest pain,  lower extremity edema, dizziness, presyncope, or syncope.   Past Medical History  Diagnosis Date  . Paroxysmal a-fib     a. S/P PVI 2/11 and 9/11  . SVT (supraventricular tachycardia)     a. Atypical AVNRT of the slow pathway  . GERD (gastroesophageal reflux disease)   . Nephrolithiasis   . Dyslipidemia   . Hypertension   . CAD (coronary artery disease)     a. S/P PCI mid LAD (vision stent) 08/31/2004;  b. repeat cath 6/11 - no progression of CAD. c. Cath 10/2012 given moderate CAD on cardiac CT - no change from prior cath.  . OSA (obstructive sleep apnea)     a. cpap noncompliance  . COPD (chronic obstructive pulmonary disease)   . Hyperthyroidism   . Type II diabetes mellitus   . HFWYOVZC(588.5)     "sometimes weekly" (11/08/2012)  . DJD (degenerative joint disease) of lumbar spine   . Arthritis     "in my back and right leg" (11/08/2012)  . Chronic lower back pain   . On home oxygen therapy     "2L q hs" (11/08/2012)  . Palpitations     a. 10/2012 - s/p LINQ loop recorder to assess for arrhythmia.   Marland Kitchen Hypomagnesemia     a. 10/2012 - Mg 0.7.  . Hypokalemia    Past Surgical History  Procedure Laterality Date  . Lumbar disc surgery  2000; 2002  . Atrial ablation surgery  2007    S/P slow pathway ablation for atypical AVNRT   . Ep study  2008    Negative EP study in [place]  . Pvi/ cti ablation  02/25/2009 and 09/2009    S/P afib/ CTI ablation  . Tibia fracture surgery Right 1990    "broke in 3 places; had rod put in" (11/08/2012)  . Tibia hardware removal Right ~ 1991  . Coronary angioplasty with stent placement  08/30/2004   Vision; to LAD   . Posterior lumbar fusion  2004  . Tonsillectomy  1965  . Cholecystectomy  ~ 2008  . Fracture surgery    . Prostate surgery  ~ 2009  . Cystoscopy w/ stone manipulation  ~ 1998    "once" (11/08/2012)  . Loop recorder implant  11/10/2012    Medtronic LinQ implanted by Dr Rayann Heman for afib management    Current Outpatient Prescriptions  Medication Sig Dispense Refill  . albuterol-ipratropium (COMBIVENT) 18-103 MCG/ACT inhaler Inhale 2 puffs into the lungs every 4 (four) hours as needed for wheezing or shortness of breath.       Marland Kitchen aspirin EC 81 MG EC tablet Take 1 tablet (81 mg total) by mouth daily.      Marland Kitchen atorvastatin (LIPITOR) 40 MG tablet Take 40 mg by mouth at bedtime.       . cloNIDine (CATAPRES) 0.2 MG tablet Takes 0.37m in the morning, 0.471mat lunch, and 0.11m56mt dinner      . cyanocobalamin 1000 MCG tablet Take 100 mcg by mouth daily.      . fenofibrate 160 MG tablet Take 160 mg by mouth daily.        .Marland Kitchen  furosemide (LASIX) 20 MG tablet Take 20 mg by mouth every other day.       Marland Kitchen HYDROcodone-acetaminophen (NORCO) 7.5-325 MG per tablet Take 1-2 tablets by mouth every 6 (six) hours as needed for moderate pain.      Marland Kitchen losartan (COZAAR) 50 MG tablet Take 50 mg by mouth daily.      . Melatonin 5 MG TABS Take 5 mg by mouth at bedtime.      . metFORMIN (GLUCOPHAGE-XR) 500 MG 24 hr tablet Take 2 tablets (1,000 mg total) by mouth 2 (two) times daily.      . methimazole (TAPAZOLE) 5 MG tablet Take 2.5 mg by mouth daily.       . metoprolol succinate (TOPROL-XL) 50 MG 24 hr tablet Take 50 mg by mouth 2 (two) times daily. Take with or immediately following a meal.      . nitroGLYCERIN (NITROSTAT) 0.4 MG SL tablet Place 0.4 mg under the tongue every 5 (five) minutes as needed. May repeat up to 3 doses.       . pantoprazole (PROTONIX) 40 MG tablet Take 40 mg by mouth daily.        . Tamsulosin HCl (FLOMAX) 0.4 MG CAPS Take 0.4 mg by mouth daily.        Marland Kitchen warfarin (COUMADIN) 5 MG  tablet Take 5-7.5 mg by mouth daily. Takes 7.76m on Sunday and Thursday. Takes 523mall other days      . zolpidem (AMBIEN) 10 MG tablet Take 10 mg by mouth at bedtime.        No current facility-administered medications for this visit.    Allergies  Allergen Reactions  . Penicillins     REACTION: rash, welps    History   Social History  . Marital Status: Married    Spouse Name: N/A    Number of Children: N/A  . Years of Education: N/A   Occupational History  . DISABLED   . 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 Smoking status: Former Smoker -- 3.00 packs/day for 38 years    Types: Cigarettes    Quit date: 03/25/2004  . Smokeless tobacco: Former [employer] developer  Types: Chew    Quit date: 04/25/2004  . Alcohol Use: Yes     Comment: 11/08/2012 "quit drinking in 1989"  . Drug Use: No  . Sexual Activity: Not Currently   Other Topics Concern  . Not on file   Social History Narrative   [place]   Two children and two grandchildren   Attends [employer]  Family History  Problem Relation Age of Onset  . Emphysema Mother   . Heart attack Father     CVA  . Hypertension Father   . Diabetes Father   . Heart disease Father     MI  . Emphysema Maternal Grandfather     Smoker  . Heart disease Paternal Grandfather    Physical Exam: Filed Vitals:   03/16/13 0946  BP: 150/92  Pulse: 91  Height: 6' (1.829 m)  Weight: 242 lb (109.77 kg)    GEN- The patient is well appearing, alert and oriented x 3 today.   Head- normocephalic, atraumatic Eyes-  Sclera clear, conjunctiva pink Ears- hearing intact Oropharynx- clear Neck- supple, no JVP Lymph- no cervical lymphadenopathy Lungs- Clear to ausculation bilaterally, normal work of breathing Heart- Regular rate and rhythm, no murmurs, rubs or gallops, PMI  not laterally displaced GI- soft, NT, ND, + BS Extremities- no clubbing, cyanosis, or edema  LINQ is reviewed  and reveals sinus, preserved heart rates, no sustained arrhythmias  Assessment and Plan:  1. CAD Stable Recent cath is reviewed Continue medical management   2. afib Well controlled Continue coumadin for stroke prevention  3. htn Elevated today.  He has blood pressures from home which reveal that his blood pressure has been well controlled at home  4. OSA He has had trouble with CPAP compliance Weight reduction is encouraged  Return to see Jerene Pitch in 3 months I will see in 6 months  OK to proceed with surgery if indicated.  Should not need a bridge while off of coumadin

## 2013-03-16 NOTE — Telephone Encounter (Signed)
New message     Pt saw Dr Rayann Heman this am---need note for clearance for colon surgery.  Please fax note on presc to [phone] attn Pam

## 2013-03-16 NOTE — Patient Instructions (Signed)
Your physician recommends that you schedule a follow-up appointment in: 3 months with Andi Hence and 6 months with Dr Rayann Heman

## 2013-04-11 ENCOUNTER — Encounter: Payer: Self-pay | Admitting: Cardiology

## 2013-04-13 ENCOUNTER — Ambulatory Visit (INDEPENDENT_AMBULATORY_CARE_PROVIDER_SITE_OTHER): Payer: Medicare PPO | Admitting: *Deleted

## 2013-04-13 DIAGNOSIS — I4891 Unspecified atrial fibrillation: Secondary | ICD-10-CM

## 2013-05-15 ENCOUNTER — Ambulatory Visit (INDEPENDENT_AMBULATORY_CARE_PROVIDER_SITE_OTHER): Payer: Medicare PPO | Admitting: *Deleted

## 2013-05-15 DIAGNOSIS — I4891 Unspecified atrial fibrillation: Secondary | ICD-10-CM

## 2013-05-15 LAB — MDC_IDC_ENUM_SESS_TYPE_REMOTE

## 2013-05-16 ENCOUNTER — Encounter: Payer: Self-pay | Admitting: Internal Medicine

## 2013-05-29 ENCOUNTER — Encounter: Payer: Medicare PPO | Admitting: Cardiology

## 2013-05-30 ENCOUNTER — Encounter: Payer: Self-pay | Admitting: Internal Medicine

## 2013-05-30 NOTE — Progress Notes (Signed)
ELECTROPHYSIOLOGY OFFICE NOTE   Patient ID: Steve Anthony MRN: [id number], DOB/AGE: [birth year]   Date of Visit: 05/31/2013  Primary Physician: [name], MD Primary Cardiologist: Thompson Grayer, MD Reason for Visit: EP follow-up for palpitations  History of Present Illness  Steve Anthony is a 63 y.o. male with atrial fibrillation s/p PVI ablation 2011, PSVT s/p EPS +RF ablation of AVNRT, CAD, COPD and OSA who underwent ILR insertion in Oct 2014 for further evaluation of palpitations. He presents today for routine electrophysiology followup. Since last being seen in our clinic, he reports he is doing well and has no complaints. He denies chest pain or shortness of breath. He denies palpitations, dizziness, near syncope or syncope. He denies LE swelling, orthopnea or PND. He reports compliance with medications.   Past Medical History Past Medical History  Diagnosis Date  . Paroxysmal a-fib     a. S/P PVI 2/11 and 9/11  . SVT (supraventricular tachycardia)     a. Atypical AVNRT of the slow pathway  . GERD (gastroesophageal reflux disease)   . Nephrolithiasis   . Dyslipidemia   . Hypertension   . CAD (coronary artery disease)     a. S/P PCI mid LAD (vision stent) 08/31/2004;  b. repeat cath 6/11 - no progression of CAD. c. Cath 10/2012 given moderate CAD on cardiac CT - no change from prior cath.  . OSA (obstructive sleep apnea)     a. cpap noncompliance  . COPD (chronic obstructive pulmonary disease)   . Hyperthyroidism   . Type II diabetes mellitus   . KGYJEHUD(149.7)     "sometimes weekly" (11/08/2012)  . DJD (degenerative joint disease) of lumbar spine   . Arthritis     "in my back and right leg" (11/08/2012)  . Chronic lower back pain   . On home oxygen therapy     "2L q hs" (11/08/2012)  . Palpitations     a. 10/2012 - s/p LINQ loop recorder to assess for arrhythmia.   Marland Kitchen Hypomagnesemia     a. 10/2012 - Mg 0.7.  . Hypokalemia     Past Surgical History Past Surgical  History  Procedure Laterality Date  . Lumbar disc surgery  2000; 2002  . Atrial ablation surgery  2007    S/P slow pathway ablation for atypical AVNRT   . Ep study  2008    Negative EP study in [place]  . Pvi/ cti ablation  02/25/2009 and 09/2009    S/P afib/ CTI ablation  . Tibia fracture surgery Right 1990    "broke in 3 places; had rod put in" (11/08/2012)  . Tibia hardware removal Right ~ 1991  . Coronary angioplasty with stent placement  08/30/2004    Vision; to LAD   . Posterior lumbar fusion  2004  . Tonsillectomy  1965  . Cholecystectomy  ~ 2008  . Fracture surgery    . Prostate surgery  ~ 2009  . Cystoscopy w/ stone manipulation  ~ 1998    "once" (11/08/2012)  . Loop recorder implant  11/10/2012    Medtronic LinQ implanted by Dr Rayann Heman for afib management    Allergies/Intolerances Allergies  Allergen Reactions  . Penicillins     REACTION: rash, welps    Current Home Medications Current Outpatient Prescriptions  Medication Sig Dispense Refill  . albuterol-ipratropium (COMBIVENT) 18-103 MCG/ACT inhaler Inhale 2 puffs into the lungs every 4 (four) hours as needed for wheezing or shortness of breath.       Marland Kitchen  aspirin EC 81 MG EC tablet Take 1 tablet (81 mg total) by mouth daily.      Marland Kitchen atorvastatin (LIPITOR) 40 MG tablet Take 40 mg by mouth at bedtime.       . cloNIDine (CATAPRES) 0.2 MG tablet Takes 0.78m in the morning, 0.459mat lunch, and 0.20m61mt dinner      . cyanocobalamin 1000 MCG tablet Take 100 mcg by mouth daily.      . fenofibrate 160 MG tablet Take 160 mg by mouth daily.        . Ferrous Sulfate (IRON) 325 (65 FE) MG TABS Take 1 tablet by mouth daily.      . furosemide (LASIX) 20 MG tablet Take 20 mg by mouth every other day.       . HMarland KitchenDROcodone-acetaminophen (NORCO) 7.5-325 MG per tablet Take 1-2 tablets by mouth every 6 (six) hours as needed for moderate pain.      . lMarland Kitchensartan (COZAAR) 50 MG tablet Take 50 mg by mouth daily.      . Melatonin 5 MG TABS Take 5  mg by mouth at bedtime.      . metFORMIN (GLUCOPHAGE-XR) 500 MG 24 hr tablet Take 2 tablets (1,000 mg total) by mouth 2 (two) times daily.      . methimazole (TAPAZOLE) 5 MG tablet Take 2.5 mg by mouth daily.       . metoprolol succinate (TOPROL-XL) 50 MG 24 hr tablet Take 50 mg by mouth 2 (two) times daily. Take with or immediately following a meal.      . nitroGLYCERIN (NITROSTAT) 0.4 MG SL tablet Place 0.4 mg under the tongue every 5 (five) minutes as needed. May repeat up to 3 doses.       . pantoprazole (PROTONIX) 40 MG tablet Take 40 mg by mouth daily.        . Tamsulosin HCl (FLOMAX) 0.4 MG CAPS Take 0.4 mg by mouth daily.        . wMarland Kitchenrfarin (COUMADIN) 5 MG tablet Take 5-7.5 mg by mouth daily. Takes 7.5mg89m Sunday and Thursday. Takes 5mg 30m other days      . zolpidem (AMBIEN) 10 MG tablet Take 10 mg by mouth at bedtime.        No current facility-administered medications for this visit.    Social History History   Social History  . Marital Status: Married    Spouse Name: N/A    Number of Children: N/A  . Years of Education: N/A   Occupational History  . DISABLED   . 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 Smoking status: Former Smoker -- 3.00 packs/day for 38 years    Types: Cigarettes    Quit date: 03/25/2004  . Smokeless tobacco: Former User Systems developerypes: Chew    Quit date: 04/25/2004  . Alcohol Use: Yes     Comment: 11/08/2012 "quit drinking in 1989"  . Drug Use: No  . Sexual Activity: Not Currently   Other Topics Concern  . Not on file   Social History Narrative   [place]   Two children and two grandchildren   Attends [employer] General: No chills, fever, night sweats or weight changes Cardiovascular: No chest pain, dyspnea on exertion, edema, orthopnea, palpitations, paroxysmal nocturnal dyspnea Dermatological: No rash, lesions or masses Respiratory: No cough,  dyspnea Urologic: No hematuria, dysuria Abdominal: No nausea,  vomiting, diarrhea, bright red blood per rectum, melena, or hematemesis Neurologic: No visual changes, weakness, changes in mental status All other systems reviewed and are otherwise negative except as noted above.  Physical Exam Vitals: Blood pressure 150/90, pulse 68, weight 240 lb (108.863 kg).  General: Well developed, well appearing 63 y.o. male in no acute distress. HEENT: Normocephalic, atraumatic. EOMs intact. Sclera nonicteric. Oropharynx clear.  Neck: Supple. No JVD. Lungs: Respirations regular and unlabored, CTA bilaterally. No wheezes, rales or rhonchi. Heart: RRR. S1, S2 present. No murmurs, rub, S3 or S4. Abdomen: Soft, non-distended.  Extremities: No clubbing, cyanosis or edema. PT/Radials 2+ and equal bilaterally. Psych: Normal affect. Neuro: Alert and oriented X 3. Moves all extremities spontaneously. Skin: Left chest ILR insertion site intact and well healed.   Diagnostics 12-lead ECG today - normal sinus rhythm at 68 bpm, normal intervals and no ST-T wave abnormalities; PR 174, QRS 84, QTc 423 Device interrogation today - 0 tachy episodes. 0 brady episodes. 0 asystole. 18 AFib episodes - most of which lasted 2-10 minutes, longest episode <1 hour in duration. Episodes were appropriate. Has known AFib. +warfarin. Histograms show adequate rate control.   Assessment and Plan  1. Palpitations No recurrence Other than paroxysmal AFib, no arrhythmias on loop recorder interrogation today  Continue routine remote ILR follow-up every 3 months Return for follow-up with Dr. Rayann Heman in 6 months  2. Atrial fibrillation Stable Most episodes are brief, lasting 2-10 minutes. Longest episode <1 hour in duration. Continue BB for rate control Continue warfarin for stroke prevention  3. CAD Cardiac cath Oct 2014 revealed stable CAD with patent stent to midLAD No anginal symptoms Continue medical therapy  4.  HTN Above goal today Steve Anthony reports his PCP is following his BP and he keeps log at home  Signed, Andrez Grime, PA-C 05/31/2013, 9:19 AM

## 2013-05-31 ENCOUNTER — Ambulatory Visit (INDEPENDENT_AMBULATORY_CARE_PROVIDER_SITE_OTHER): Payer: Medicare PPO | Admitting: Cardiology

## 2013-05-31 ENCOUNTER — Encounter: Payer: Self-pay | Admitting: Cardiology

## 2013-05-31 VITALS — BP 150/90 | HR 68 | Wt 240.0 lb

## 2013-05-31 DIAGNOSIS — R002 Palpitations: Secondary | ICD-10-CM

## 2013-05-31 DIAGNOSIS — I2510 Atherosclerotic heart disease of native coronary artery without angina pectoris: Secondary | ICD-10-CM

## 2013-05-31 DIAGNOSIS — I4891 Unspecified atrial fibrillation: Secondary | ICD-10-CM

## 2013-05-31 DIAGNOSIS — Z4509 Encounter for adjustment and management of other cardiac device: Secondary | ICD-10-CM

## 2013-05-31 LAB — MDC_IDC_ENUM_SESS_TYPE_INCLINIC
MDC IDC SESS DTM: 20150507103054
MDC IDC SET ZONE DETECTION INTERVAL: 3000 ms
MDC IDC SET ZONE DETECTION INTERVAL: 360 ms
Zone Setting Detection Interval: 2000 ms

## 2013-05-31 NOTE — Patient Instructions (Signed)
Your Physician recommends you keep your upcoming appointment with Dr. Rayann Heman   Your physician recommends that you continue on your current medications as directed. Please refer to the Current Medication list given to you today.

## 2013-06-15 ENCOUNTER — Ambulatory Visit (INDEPENDENT_AMBULATORY_CARE_PROVIDER_SITE_OTHER): Payer: Medicare PPO | Admitting: *Deleted

## 2013-06-15 DIAGNOSIS — I4891 Unspecified atrial fibrillation: Secondary | ICD-10-CM

## 2013-06-15 LAB — MDC_IDC_ENUM_SESS_TYPE_REMOTE

## 2013-06-30 ENCOUNTER — Other Ambulatory Visit: Payer: Self-pay | Admitting: Internal Medicine

## 2013-07-12 ENCOUNTER — Encounter: Payer: Self-pay | Admitting: Internal Medicine

## 2013-07-16 ENCOUNTER — Ambulatory Visit (INDEPENDENT_AMBULATORY_CARE_PROVIDER_SITE_OTHER): Payer: Medicare PPO | Admitting: *Deleted

## 2013-07-16 DIAGNOSIS — I480 Paroxysmal atrial fibrillation: Secondary | ICD-10-CM

## 2013-07-16 DIAGNOSIS — I4891 Unspecified atrial fibrillation: Secondary | ICD-10-CM

## 2013-07-16 LAB — MDC_IDC_ENUM_SESS_TYPE_REMOTE

## 2013-07-20 NOTE — Progress Notes (Signed)
Loop recorder 

## 2013-07-23 LAB — MDC_IDC_ENUM_SESS_TYPE_REMOTE: Date Time Interrogation Session: 20150429040500

## 2013-08-06 ENCOUNTER — Encounter: Payer: Self-pay | Admitting: Internal Medicine

## 2013-08-15 ENCOUNTER — Ambulatory Visit (INDEPENDENT_AMBULATORY_CARE_PROVIDER_SITE_OTHER): Payer: Medicare PPO | Admitting: *Deleted

## 2013-08-15 DIAGNOSIS — I4891 Unspecified atrial fibrillation: Secondary | ICD-10-CM

## 2013-08-15 DIAGNOSIS — I480 Paroxysmal atrial fibrillation: Secondary | ICD-10-CM

## 2013-08-15 LAB — MDC_IDC_ENUM_SESS_TYPE_REMOTE

## 2013-08-20 NOTE — Progress Notes (Signed)
Loop recorder 

## 2013-08-23 ENCOUNTER — Encounter: Payer: Self-pay | Admitting: Internal Medicine

## 2013-08-25 ENCOUNTER — Other Ambulatory Visit: Payer: Self-pay | Admitting: Internal Medicine

## 2013-09-14 ENCOUNTER — Ambulatory Visit (INDEPENDENT_AMBULATORY_CARE_PROVIDER_SITE_OTHER): Payer: Medicare PPO | Admitting: *Deleted

## 2013-09-14 DIAGNOSIS — I4891 Unspecified atrial fibrillation: Secondary | ICD-10-CM

## 2013-09-14 DIAGNOSIS — I480 Paroxysmal atrial fibrillation: Secondary | ICD-10-CM

## 2013-09-14 LAB — MDC_IDC_ENUM_SESS_TYPE_REMOTE

## 2013-09-20 NOTE — Progress Notes (Signed)
Loop recorder 

## 2013-10-09 ENCOUNTER — Encounter: Payer: Self-pay | Admitting: Internal Medicine

## 2013-10-11 ENCOUNTER — Encounter: Payer: Self-pay | Admitting: Internal Medicine

## 2013-10-15 ENCOUNTER — Ambulatory Visit (INDEPENDENT_AMBULATORY_CARE_PROVIDER_SITE_OTHER): Payer: Medicare PPO | Admitting: *Deleted

## 2013-10-15 DIAGNOSIS — I4891 Unspecified atrial fibrillation: Secondary | ICD-10-CM

## 2013-10-15 DIAGNOSIS — I480 Paroxysmal atrial fibrillation: Secondary | ICD-10-CM

## 2013-10-16 NOTE — Progress Notes (Signed)
Loop recorder 

## 2013-10-17 ENCOUNTER — Encounter: Payer: Self-pay | Admitting: Internal Medicine

## 2013-10-23 LAB — MDC_IDC_ENUM_SESS_TYPE_REMOTE

## 2013-10-31 ENCOUNTER — Encounter: Payer: Self-pay | Admitting: Internal Medicine

## 2013-11-13 ENCOUNTER — Ambulatory Visit (INDEPENDENT_AMBULATORY_CARE_PROVIDER_SITE_OTHER): Payer: Medicare PPO | Admitting: *Deleted

## 2013-11-13 DIAGNOSIS — I480 Paroxysmal atrial fibrillation: Secondary | ICD-10-CM

## 2013-11-13 LAB — MDC_IDC_ENUM_SESS_TYPE_REMOTE

## 2013-11-23 NOTE — Progress Notes (Signed)
Loop recorder 

## 2013-11-29 ENCOUNTER — Encounter: Payer: Self-pay | Admitting: Internal Medicine

## 2013-12-10 ENCOUNTER — Encounter: Payer: Self-pay | Admitting: Internal Medicine

## 2013-12-10 ENCOUNTER — Ambulatory Visit (INDEPENDENT_AMBULATORY_CARE_PROVIDER_SITE_OTHER): Payer: Medicare PPO | Admitting: Internal Medicine

## 2013-12-10 VITALS — BP 120/90 | HR 69 | Ht 72.0 in | Wt 247.8 lb

## 2013-12-10 DIAGNOSIS — I480 Paroxysmal atrial fibrillation: Secondary | ICD-10-CM

## 2013-12-10 DIAGNOSIS — I10 Essential (primary) hypertension: Secondary | ICD-10-CM

## 2013-12-10 LAB — MDC_IDC_ENUM_SESS_TYPE_INCLINIC
Date Time Interrogation Session: 20151116204443
Zone Setting Detection Interval: 2000 ms
Zone Setting Detection Interval: 3000 ms
Zone Setting Detection Interval: 360 ms

## 2013-12-10 NOTE — Progress Notes (Signed)
ELECTROPHYSIOLOGY OFFICE NOTE   Patient ID: Steve Anthony MRN: [id number], DOB/AGE: 62-Oct-1952   Date of Visit: 12/10/2013  Primary Physician: [name], MD Primary Cardiologist: Thompson Grayer, MD Reason for Visit: EP follow-up for palpitations  History of Present Illness  Steve Anthony is a 63 y.o. male with atrial fibrillation s/p PVI ablation 2011, PSVT s/p EPS +RF ablation of AVNRT, CAD, COPD and OSA who underwent ILR insertion in Oct 2014 for further evaluation of palpitations.  He presents today for routine electrophysiology followup. Since last being seen in our clinic, he reports he is doing well and has no complaints.  IRL does not show any occurrence of afib. He denies chest pain or shortness of breath. He denies palpitations, dizziness, near syncope or syncope. He denies LE swelling, orthopnea or PND. Recent chest cold currently treated with Levaquin, showing improvement.  He reports compliance with medications.   Past Medical History Past Medical History  Diagnosis Date  . Paroxysmal a-fib     a. S/P PVI 2/11 and 9/11  . SVT (supraventricular tachycardia)     a. Atypical AVNRT of the slow pathway  . GERD (gastroesophageal reflux disease)   . Nephrolithiasis   . Dyslipidemia   . Hypertension   . CAD (coronary artery disease)     a. S/P PCI mid LAD (vision stent) 08/31/2004;  b. repeat cath 6/11 - no progression of CAD. c. Cath 10/2012 given moderate CAD on cardiac CT - no change from prior cath.  . OSA (obstructive sleep apnea)     a. cpap noncompliance  . COPD (chronic obstructive pulmonary disease)   . Hyperthyroidism   . Type II diabetes mellitus   . QQPYPPJK(932.6)     "sometimes weekly" (11/08/2012)  . DJD (degenerative joint disease) of lumbar spine   . Arthritis     "in my back and right leg" (11/08/2012)  . Chronic lower back pain   . On home oxygen therapy     "2L q hs" (11/08/2012)  . Palpitations     a. 10/2012 - s/p LINQ loop recorder to assess for  arrhythmia.   Marland Kitchen Hypomagnesemia     a. 10/2012 - Mg 0.7.  . Hypokalemia     Past Surgical History Past Surgical History  Procedure Laterality Date  . Lumbar disc surgery  2000; 2002  . Atrial ablation surgery  2007    S/P slow pathway ablation for atypical AVNRT   . Ep study  2008    Negative EP study in [place]  . Pvi/ cti ablation  02/25/2009 and 09/2009    S/P afib/ CTI ablation  . Tibia fracture surgery Right 1990    "broke in 3 places; had rod put in" (11/08/2012)  . Tibia hardware removal Right ~ 1991  . Coronary angioplasty with stent placement  08/30/2004    Vision; to LAD   . Posterior lumbar fusion  2004  . Tonsillectomy  1965  . Cholecystectomy  ~ 2008  . Fracture surgery    . Prostate surgery  ~ 2009  . Cystoscopy w/ stone manipulation  ~ 1998    "once" (11/08/2012)  . Loop recorder implant  11/10/2012    Medtronic LinQ implanted by Dr Rayann Heman for afib management    Allergies/Intolerances Allergies  Allergen Reactions  . Penicillins     REACTION: rash, welps    Current Home Medications Current Outpatient Prescriptions  Medication Sig Dispense Refill  . albuterol-ipratropium (COMBIVENT) 18-103 MCG/ACT inhaler Inhale 2 puffs into  the lungs every 4 (four) hours as needed for wheezing or shortness of breath.     Marland Kitchen aspirin EC 81 MG EC tablet Take 1 tablet (81 mg total) by mouth daily.    Marland Kitchen atorvastatin (LIPITOR) 40 MG tablet Take 40 mg by mouth at bedtime.     . cloNIDine (CATAPRES) 0.2 MG tablet Take 0.66m by mouth in the morning, 0.467mby mouth at lunch, and 0.23m73my mouth at dinner    . cyanocobalamin 1000 MCG tablet Take 100 mcg by mouth daily.    . fenofibrate 160 MG tablet Take 160 mg by mouth daily.      . fentaNYL (DURAGESIC - DOSED MCG/HR) 25 MCG/HR patch Place 1 patch onto the skin every 3 (three) days.  0  . Ferrous Sulfate (IRON) 325 (65 FE) MG TABS Take 1 tablet by mouth daily.    . furosemide (LASIX) 20 MG tablet Take 20 mg by mouth every other day.       . HMarland KitchenDROcodone-acetaminophen (NORCO) 7.5-325 MG per tablet Take 1-2 tablets by mouth every 6 (six) hours as needed for moderate pain.    . lMarland Kitchenvofloxacin (LEVAQUIN) 500 MG tablet Take 1 tablet by mouth daily.  0  . losartan (COZAAR) 50 MG tablet Take 50 mg by mouth daily.    . Melatonin 5 MG TABS Take 5 mg by mouth at bedtime.    . metFORMIN (GLUCOPHAGE-XR) 500 MG 24 hr tablet Take 2 tablets (1,000 mg total) by mouth 2 (two) times daily.    . metoprolol succinate (TOPROL-XL) 50 MG 24 hr tablet TAKE 1 TABLET BY MOUTH TWICE A DAY 60 tablet 3  . nitroGLYCERIN (NITROSTAT) 0.4 MG SL tablet Place 0.4 mg under the tongue every 5 (five) minutes as needed for chest pain. May repeat up to 3 doses.    . pantoprazole (PROTONIX) 40 MG tablet Take 40 mg by mouth daily.      . Tamsulosin HCl (FLOMAX) 0.4 MG CAPS Take 0.4 mg by mouth daily.      . wMarland Kitchenrfarin (COUMADIN) 5 MG tablet Take 5-7.5 mg by mouth daily. Takes 7.5mg48m Sunday and Thursday. Takes 5mg 53m other days    . zolpidem (AMBIEN CR) 12.5 MG CR tablet Take 1 tablet by mouth at bedtime.  5   No current facility-administered medications for this visit.    Social History History   Social History  . Marital Status: Married    Spouse Name: N/A    Number of Children: N/A  . Years of Education: N/A   Occupational History  . DISABLED   . 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 Smoking status: Former Smoker -- 3.00 packs/day for 38 years    Types: Cigarettes    Quit date: 03/25/2004  . Smokeless tobacco: Former User Systems developerypes: Chew    Quit date: 04/25/2004  . Alcohol Use: Yes     Comment: 11/08/2012 "quit drinking in 1989"  . Drug Use: No  . Sexual Activity: Not Currently   Other Topics Concern  . Not on file   Social History Narrative   [place]   Two children and two grandchildren   Attends [employer] General: No chills, fever, night sweats or  weight changes Cardiovascular: No chest pain, dyspnea on exertion, edema, orthopnea, palpitations, paroxysmal nocturnal dyspnea Dermatological: No rash, lesions or masses Respiratory: No cough, dyspnea  Urologic: No hematuria, dysuria Abdominal: No nausea, vomiting, diarrhea, bright red blood per rectum, melena, or hematemesis Neurologic: No visual changes, weakness, changes in mental status All other systems reviewed and are otherwise negative except as noted above.  Physical Exam Vitals: Blood pressure 120/90, pulse 69, height 6' (1.829 m), weight 247 lb 12.8 oz (112.401 kg).  General: Well developed, well appearing 63 y.o. male in no acute distress. HEENT: Normocephalic, atraumatic. EOMs intact. Sclera nonicteric. Oropharynx clear.  Neck: Supple. No JVD. Lungs: Respirations regular and unlabored, CTA bilaterally. No wheezes, rales or rhonchi. Heart: RRR. S1, S2 present. No murmurs, rub, S3 or S4. Abdomen: Soft, non-distended.  Extremities: No clubbing, cyanosis or edema. PT/Radials 2+ and equal bilaterally. Psych: Normal affect. Neuro: Alert and oriented X 3. Moves all extremities spontaneously. Skin: Left chest ILR insertion site intact and well healed.   Diagnostics  Device interrogation today - 0 tachy episodes. 0 brady episodes. 0 asystole. 0 afib.  Assessment and Plan  1. Palpitations No recurrence  2. Atrial fibrillation No evidence by ILR. Continue warfarin for stroke prevention  3. CAD Cardiac cath Oct 2014 revealed stable CAD with patent stent to midLAD No anginal symptoms Continue medical therapy  4. HTN Controlled, no change.  Signed, Roderic Palau, 12/10/2013, 4:06 PM  I have seen, examined the patient, and reviewed the above assessment and plan.  Changes to above are made where necessary.  He is doing well without any recent arrhythmias.  Follow-up in the AF clinic in 3 months  Co Sign: Thompson Grayer, MD 12/10/2013 8:46 PM

## 2013-12-10 NOTE — Patient Instructions (Addendum)
Your physician wants you to follow-up in:  6 months with Roderic Palau, NP in [facility] at hospital You will receive a reminder letter in the mail two months in advance. If you don't receive a letter, please call our office to schedule the follow-up appointment.

## 2013-12-13 ENCOUNTER — Ambulatory Visit (INDEPENDENT_AMBULATORY_CARE_PROVIDER_SITE_OTHER): Payer: Medicare PPO | Admitting: *Deleted

## 2013-12-13 DIAGNOSIS — I480 Paroxysmal atrial fibrillation: Secondary | ICD-10-CM

## 2013-12-13 LAB — MDC_IDC_ENUM_SESS_TYPE_REMOTE

## 2013-12-18 NOTE — Progress Notes (Signed)
Loop recorder 

## 2013-12-26 ENCOUNTER — Telehealth: Payer: Self-pay | Admitting: Internal Medicine

## 2013-12-26 NOTE — Telephone Encounter (Signed)
Received request from Nurse fax box, documents faxed for surgical clearance. To: [facility] Fax number: [phone] Attention: 12.2.15/KM

## 2013-12-31 ENCOUNTER — Encounter: Payer: Self-pay | Admitting: Internal Medicine

## 2014-01-03 ENCOUNTER — Telehealth: Payer: Self-pay | Admitting: Internal Medicine

## 2014-01-03 ENCOUNTER — Encounter (HOSPITAL_COMMUNITY): Payer: Self-pay | Admitting: Internal Medicine

## 2014-01-03 NOTE — Telephone Encounter (Signed)
New message     Pt want to know if he should stop his aspirin before his dec 22nd knee surgery.  He knows to stop his coumadin but no one said stop the aspirin.  Please call

## 2014-01-03 NOTE — Telephone Encounter (Signed)
lmom with patient and let him know that would be up to the surgeon and to call them to discuss

## 2014-01-11 ENCOUNTER — Ambulatory Visit (INDEPENDENT_AMBULATORY_CARE_PROVIDER_SITE_OTHER): Payer: Medicare PPO | Admitting: *Deleted

## 2014-01-11 DIAGNOSIS — I480 Paroxysmal atrial fibrillation: Secondary | ICD-10-CM

## 2014-01-23 NOTE — Progress Notes (Signed)
Loop recorder 

## 2014-02-05 LAB — MDC_IDC_ENUM_SESS_TYPE_REMOTE: Date Time Interrogation Session: 20151221050500

## 2014-02-11 ENCOUNTER — Ambulatory Visit (INDEPENDENT_AMBULATORY_CARE_PROVIDER_SITE_OTHER): Payer: Medicare PPO | Admitting: *Deleted

## 2014-02-11 DIAGNOSIS — I480 Paroxysmal atrial fibrillation: Secondary | ICD-10-CM

## 2014-02-12 NOTE — Progress Notes (Signed)
Loop recorder 

## 2014-02-19 ENCOUNTER — Encounter: Payer: Self-pay | Admitting: Internal Medicine

## 2014-02-22 ENCOUNTER — Encounter: Payer: Self-pay | Admitting: Internal Medicine

## 2014-02-28 ENCOUNTER — Encounter: Payer: Self-pay | Admitting: Internal Medicine

## 2014-03-03 LAB — MDC_IDC_ENUM_SESS_TYPE_REMOTE: MDC IDC SESS DTM: 20160118050500

## 2014-03-13 ENCOUNTER — Ambulatory Visit (INDEPENDENT_AMBULATORY_CARE_PROVIDER_SITE_OTHER): Payer: Medicare PPO | Admitting: *Deleted

## 2014-03-13 DIAGNOSIS — I480 Paroxysmal atrial fibrillation: Secondary | ICD-10-CM

## 2014-03-13 LAB — MDC_IDC_ENUM_SESS_TYPE_REMOTE: Date Time Interrogation Session: 20160227050500

## 2014-03-15 NOTE — Progress Notes (Signed)
Loop recorder 

## 2014-03-21 ENCOUNTER — Encounter: Payer: Self-pay | Admitting: Internal Medicine

## 2014-04-11 ENCOUNTER — Encounter: Payer: Self-pay | Admitting: Internal Medicine

## 2014-04-12 ENCOUNTER — Ambulatory Visit (INDEPENDENT_AMBULATORY_CARE_PROVIDER_SITE_OTHER): Payer: Medicare PPO | Admitting: *Deleted

## 2014-04-12 DIAGNOSIS — I480 Paroxysmal atrial fibrillation: Secondary | ICD-10-CM

## 2014-04-15 ENCOUNTER — Encounter: Payer: Self-pay | Admitting: Internal Medicine

## 2014-04-19 NOTE — Progress Notes (Signed)
Loop recorder 

## 2014-04-24 LAB — MDC_IDC_ENUM_SESS_TYPE_REMOTE: MDC IDC SESS DTM: 20160329040500

## 2014-05-03 ENCOUNTER — Encounter: Payer: Self-pay | Admitting: Internal Medicine

## 2014-05-13 ENCOUNTER — Ambulatory Visit (INDEPENDENT_AMBULATORY_CARE_PROVIDER_SITE_OTHER): Payer: Medicare PPO | Admitting: *Deleted

## 2014-05-13 DIAGNOSIS — I480 Paroxysmal atrial fibrillation: Secondary | ICD-10-CM | POA: Diagnosis not present

## 2014-05-15 NOTE — Progress Notes (Signed)
Loop recorder 

## 2014-06-04 ENCOUNTER — Ambulatory Visit (HOSPITAL_COMMUNITY): Payer: Medicare PPO | Admitting: Nurse Practitioner

## 2014-06-06 LAB — CUP PACEART REMOTE DEVICE CHECK: Date Time Interrogation Session: 20160512163721

## 2014-06-07 ENCOUNTER — Encounter (HOSPITAL_COMMUNITY): Payer: Self-pay | Admitting: Nurse Practitioner

## 2014-06-07 ENCOUNTER — Ambulatory Visit (HOSPITAL_COMMUNITY)
Admission: RE | Admit: 2014-06-07 | Discharge: 2014-06-07 | Disposition: A | Discharge status: Home / Self Care | Payer: Medicare PPO | Source: Ambulatory Visit | Attending: Nurse Practitioner | Admitting: Nurse Practitioner

## 2014-06-07 VITALS — BP 150/98 | HR 74 | Ht 72.0 in | Wt 266.4 lb

## 2014-06-07 DIAGNOSIS — I481 Persistent atrial fibrillation: Secondary | ICD-10-CM | POA: Insufficient documentation

## 2014-06-07 DIAGNOSIS — I4819 Other persistent atrial fibrillation: Secondary | ICD-10-CM

## 2014-06-07 NOTE — Patient Instructions (Signed)
Your physician wants you to follow-up in: 6 months with Dr. Rayann Heman. You will receive a reminder letter in the mail two months in advance. If you don't receive a letter, please call our office to schedule the follow-up appointment.

## 2014-06-07 NOTE — Progress Notes (Signed)
Patient ID: Steve Anthony, male   DOB: 1950-12-04, 64 y.o.   MRN: [id number]   Date of Visit: 06/07/2014  Primary Physician: [name], MD Primary Cardiologist: Thompson Grayer, MD Reason for Visit: EP follow-up for palpitations  History of Present Illness  Steve Anthony is a 64 y.o. male with atrial fibrillation s/p PVI ablation 2011, PSVT s/p EPS +RF ablation of AVNRT, CAD, COPD and OSA who underwent ILR insertion in Oct 2014 for further evaluation of palpitations.  He presents today for  Follow up in afib clinic.Marland Kitchen Since last being seen in our clinic, he reports he is doing well and has no complaints.  No episodes of afib. Is having some hip issues and may need surgery in the near future. Being compliant with warfarin and has recent lab test and in range. ILR not interrogated today. BP mildly elevated but he states is his normal range. He denies chest pain or shortness of breath. He denies palpitations, dizziness, near syncope or syncope. He denies LE swelling, orthopnea or PND. Recent chest cold currently treated with Levaquin, showing improvement.  He reports compliance with medications.   Past Medical History Past Medical History  Diagnosis Date  . Paroxysmal a-fib     a. S/P PVI 2/11 and 9/11  . SVT (supraventricular tachycardia)     a. Atypical AVNRT of the slow pathway  . GERD (gastroesophageal reflux disease)   . Nephrolithiasis   . Dyslipidemia   . Hypertension   . CAD (coronary artery disease)     a. S/P PCI mid LAD (vision stent) 08/31/2004;  b. repeat cath 6/11 - no progression of CAD. c. Cath 10/2012 given moderate CAD on cardiac CT - no change from prior cath.  . OSA (obstructive sleep apnea)     a. cpap noncompliance  . COPD (chronic obstructive pulmonary disease)   . Hyperthyroidism   . Type II diabetes mellitus   . MBWGYKZL(935.7)     "sometimes weekly" (11/08/2012)  . DJD (degenerative joint disease) of lumbar spine   . Arthritis     "in my back and right leg"  (11/08/2012)  . Chronic lower back pain   . On home oxygen therapy     "2L q hs" (11/08/2012)  . Palpitations     a. 10/2012 - s/p LINQ loop recorder to assess for arrhythmia.   Marland Kitchen Hypomagnesemia     a. 10/2012 - Mg 0.7.  . Hypokalemia     Past Surgical History Past Surgical History  Procedure Laterality Date  . Lumbar disc surgery  2000; 2002  . Atrial ablation surgery  2007    S/P slow pathway ablation for atypical AVNRT   . Ep study  2008    Negative EP study in [place]  . Pvi/ cti ablation  02/25/2009 and 09/2009    S/P afib/ CTI ablation  . Tibia fracture surgery Right 1990    "broke in 3 places; had rod put in" (11/08/2012)  . Tibia hardware removal Right ~ 1991  . Coronary angioplasty with stent placement  08/30/2004    Vision; to LAD   . Posterior lumbar fusion  2004  . Tonsillectomy  1965  . Cholecystectomy  ~ 2008  . Fracture surgery    . Prostate surgery  ~ 2009  . Cystoscopy w/ stone manipulation  ~ 1998    "once" (11/08/2012)  . Loop recorder implant  11/10/2012    Medtronic LinQ implanted by Dr Rayann Heman for afib management  . Loop recorder  implant N/A 11/10/2012    Procedure: LOOP RECORDER IMPLANT;  Surgeon: Coralyn Mark, MD;  Location: [facility] CATH LAB;  Service: Cardiovascular;  Laterality: N/A;  . Left heart catheterization with coronary angiogram N/A 11/10/2012    Procedure: LEFT HEART CATHETERIZATION WITH CORONARY ANGIOGRAM;  Surgeon: Burnell Blanks, MD;  Location: [facility] CATH LAB;  Service: Cardiovascular;  Laterality: N/A;    Allergies/Intolerances Allergies  Allergen Reactions  . Penicillins     REACTION: rash, welps    Current Home Medications Current Outpatient Prescriptions  Medication Sig Dispense Refill  . albuterol-ipratropium (COMBIVENT) 18-103 MCG/ACT inhaler Inhale 2 puffs into the lungs every 4 (four) hours as needed for wheezing or shortness of breath.     Marland Kitchen aspirin EC 81 MG EC tablet Take 1 tablet (81 mg total) by mouth daily.    Marland Kitchen  atorvastatin (LIPITOR) 40 MG tablet Take 40 mg by mouth at bedtime.     . cloNIDine (CATAPRES) 0.2 MG tablet Take 0.56m by mouth in the morning, 0.439mby mouth at lunch, and 0.19m61my mouth at dinner    . cyanocobalamin 1000 MCG tablet Take 100 mcg by mouth daily.    . fenofibrate 160 MG tablet Take 160 mg by mouth daily.      . fentaNYL (DURAGESIC - DOSED MCG/HR) 25 MCG/HR patch Place 1 patch onto the skin every 3 (three) days.  0  . furosemide (LASIX) 20 MG tablet Take 20 mg by mouth every other day.     . HMarland KitchenDROcodone-acetaminophen (NORCO) 7.5-325 MG per tablet Take 1-2 tablets by mouth every 6 (six) hours as needed for moderate pain.    . lMarland Kitchensartan (COZAAR) 50 MG tablet Take 50 mg by mouth daily.    . Melatonin 5 MG TABS Take 5 mg by mouth at bedtime.    . metFORMIN (GLUCOPHAGE-XR) 500 MG 24 hr tablet Take 2 tablets (1,000 mg total) by mouth 2 (two) times daily.    . metoprolol succinate (TOPROL-XL) 50 MG 24 hr tablet TAKE 1 TABLET BY MOUTH TWICE A DAY 60 tablet 3  . pantoprazole (PROTONIX) 40 MG tablet Take 40 mg by mouth daily.      . predniSONE (DELTASONE) 10 MG tablet Take 10 mg by mouth daily with breakfast. Taper starting at 75m619m . Tamsulosin HCl (FLOMAX) 0.4 MG CAPS Take 0.4 mg by mouth daily.      . waMarland Kitchenfarin (COUMADIN) 5 MG tablet Take 5-7.5 mg by mouth daily. Takes 7.5mg 58mSunday and Thursday. Takes 5mg a19mother days    . zolpidem (AMBIEN CR) 12.5 MG CR tablet Take 1 tablet by mouth at bedtime.  5  . nitroGLYCERIN (NITROSTAT) 0.4 MG SL tablet Place 0.4 mg under the tongue every 5 (five) minutes as needed for chest pain. May repeat up to 3 doses.     No current facility-administered medications for this encounter.    Social History History   Social History  . Marital Status: Married    Spouse Name: N/A  . Number of Children: N/A  . Years of Education: N/A   Occupational History  . DISABLED   . Retired DOT truck driverGeophysicist/field seismologistcial History Main Topics  . Smoking status:  Former Smoker -- 3.00 packs/day for 38 years    Types: Cigarettes    Quit date: 03/25/2004  . Smokeless tobacco: Former User  Systems developerpes: Chew    Quit date: 04/25/2004  . Alcohol Use: Yes  Comment: 11/08/2012 "quit drinking in 1989"  . Drug Use: No  . Sexual Activity: Not Currently   Other Topics Concern  . Not on file   Social History Narrative   Resides in [place] with his wife   Two children and two grandchildren   Attends [employer]     Review of Systems General: No chills, fever, night sweats or weight changes Cardiovascular: No chest pain, dyspnea on exertion, edema, orthopnea, palpitations, paroxysmal nocturnal dyspnea Dermatological: No rash, lesions or masses Respiratory: No cough, dyspnea Urologic: No hematuria, dysuria Abdominal: No nausea, vomiting, diarrhea, bright red blood per rectum, melena, or hematemesis Neurologic: No visual changes, weakness, changes in mental status All other systems reviewed and are otherwise negative except as noted above.  Physical Exam Vitals: Blood pressure 150/98, pulse 74, height 6' (1.829 m), weight 266 lb 6.4 oz (120.838 kg).  General: Well developed, well appearing 64 y.o. male in no acute distress. HEENT: Normocephalic, atraumatic. EOMs intact. Sclera nonicteric. Oropharynx clear.  Neck: Supple. No JVD. Lungs: Respirations regular and unlabored, CTA bilaterally. No wheezes, rales or rhonchi. Heart: RRR. S1, S2 present. No murmurs, rub, S3 or S4. Abdomen: Soft, non-distended.  Extremities: No clubbing, cyanosis or edema. PT/Radials 2+ and equal bilaterally. Psych: Normal affect. Neuro: Alert and oriented X 3. Moves all extremities spontaneously. Skin: Left chest ILR insertion site intact and well healed.     Device interrogation today -none Assessment and Plan  1. Palpitations No recurrence  2. Atrial fibrillation No complaints of reoccurence Continue warfarin for stroke prevention  3.  CAD Cardiac cath Oct 2014 revealed stable CAD with patent stent to midLAD No anginal symptoms Continue medical therapy  4. HTN Per pt , PCP is happy with this range, difficult to control in past, without causing hypotension.  F/u with Dr. Rayann Heman in 6 months.  Signed, Roderic Palau, 06/07/2014, 9:36 AM

## 2014-06-11 ENCOUNTER — Ambulatory Visit (INDEPENDENT_AMBULATORY_CARE_PROVIDER_SITE_OTHER): Payer: Medicare PPO | Admitting: *Deleted

## 2014-06-11 DIAGNOSIS — I480 Paroxysmal atrial fibrillation: Secondary | ICD-10-CM

## 2014-06-14 NOTE — Progress Notes (Signed)
Loop recorder 

## 2014-06-26 LAB — CUP PACEART REMOTE DEVICE CHECK: MDC IDC SESS DTM: 20160520040500

## 2014-06-27 ENCOUNTER — Encounter: Payer: Self-pay | Admitting: Internal Medicine

## 2014-07-08 ENCOUNTER — Encounter: Payer: Self-pay | Admitting: Internal Medicine

## 2014-07-10 ENCOUNTER — Encounter: Payer: Self-pay | Admitting: Internal Medicine

## 2014-07-11 ENCOUNTER — Ambulatory Visit (INDEPENDENT_AMBULATORY_CARE_PROVIDER_SITE_OTHER): Payer: Medicare PPO | Admitting: *Deleted

## 2014-07-11 DIAGNOSIS — I480 Paroxysmal atrial fibrillation: Secondary | ICD-10-CM | POA: Diagnosis not present

## 2014-07-15 NOTE — Progress Notes (Signed)
Loop recorder 

## 2014-07-22 LAB — CUP PACEART REMOTE DEVICE CHECK: Date Time Interrogation Session: 20160627165755

## 2014-08-05 ENCOUNTER — Encounter: Payer: Self-pay | Admitting: Internal Medicine

## 2014-08-09 ENCOUNTER — Ambulatory Visit (INDEPENDENT_AMBULATORY_CARE_PROVIDER_SITE_OTHER): Payer: Medicare PPO | Admitting: *Deleted

## 2014-08-09 DIAGNOSIS — I480 Paroxysmal atrial fibrillation: Secondary | ICD-10-CM | POA: Diagnosis not present

## 2014-08-09 LAB — CUP PACEART REMOTE DEVICE CHECK: MDC IDC SESS DTM: 20160722131151

## 2014-08-13 NOTE — Progress Notes (Signed)
Loop recorder 

## 2014-08-29 ENCOUNTER — Telehealth: Payer: Self-pay | Admitting: Internal Medicine

## 2014-08-29 NOTE — Telephone Encounter (Signed)
ERROR

## 2014-09-05 ENCOUNTER — Encounter: Payer: Self-pay | Admitting: Internal Medicine

## 2014-09-09 ENCOUNTER — Ambulatory Visit (INDEPENDENT_AMBULATORY_CARE_PROVIDER_SITE_OTHER): Payer: Medicare PPO | Admitting: *Deleted

## 2014-09-09 DIAGNOSIS — I480 Paroxysmal atrial fibrillation: Secondary | ICD-10-CM | POA: Diagnosis not present

## 2014-09-10 NOTE — Progress Notes (Signed)
Loop recorder 

## 2014-09-23 LAB — CUP PACEART REMOTE DEVICE CHECK: MDC IDC SESS DTM: 20160829111320

## 2014-10-02 ENCOUNTER — Encounter: Payer: Self-pay | Admitting: Internal Medicine

## 2014-10-09 ENCOUNTER — Ambulatory Visit (INDEPENDENT_AMBULATORY_CARE_PROVIDER_SITE_OTHER): Payer: Medicare PPO | Admitting: *Deleted

## 2014-10-09 DIAGNOSIS — I480 Paroxysmal atrial fibrillation: Secondary | ICD-10-CM | POA: Diagnosis not present

## 2014-10-10 NOTE — Progress Notes (Signed)
Loop recorder 

## 2014-10-17 LAB — CUP PACEART REMOTE DEVICE CHECK: Date Time Interrogation Session: 20160914213529

## 2014-10-17 NOTE — Progress Notes (Signed)
Carelink summary report received. Battery status OK. Normal device function. No new symptom episodes, tachy episodes, brady, or pause episodes. No new AF episodes, +warfarin. Monthly summary reports and ROV with JA on 12/04/14 at 12:30pm.

## 2014-10-31 ENCOUNTER — Encounter: Payer: Self-pay | Admitting: Internal Medicine

## 2014-11-08 ENCOUNTER — Ambulatory Visit (INDEPENDENT_AMBULATORY_CARE_PROVIDER_SITE_OTHER): Payer: Medicare PPO | Admitting: *Deleted

## 2014-11-08 DIAGNOSIS — I480 Paroxysmal atrial fibrillation: Secondary | ICD-10-CM | POA: Diagnosis not present

## 2014-11-13 ENCOUNTER — Encounter: Payer: Self-pay | Admitting: Internal Medicine

## 2014-11-15 NOTE — Progress Notes (Signed)
LOOP RECORDER

## 2014-11-20 ENCOUNTER — Telehealth: Payer: Self-pay | Admitting: Internal Medicine

## 2014-11-20 NOTE — Telephone Encounter (Signed)
F/u  Pt wanted to clarify also- he is aware that he needs to be off coumadin for 5 days before surgery, but he is wondering if he also needs to be off the "shots in the stomach" in those 5 days. He usually holds off on those while holding coumadin, when necessary, but pt wanted to clarify. Please call back and discuss.

## 2014-11-20 NOTE — Telephone Encounter (Signed)
[facility] Cardiac Clearance re faxed to Dr.Frank Aluisio,MD  [phone].

## 2014-11-20 NOTE — Telephone Encounter (Signed)
New Message  Pt called to sched appt for surgical clearance- pt was told policy about having surgical office to contact our office. Pt stated his surgery w/ [facility] is sched for 11/23 and that their office has sent clearance forms to our office months ago. Pt had appt on 11/9 but it was cancelled due to provider sched change. Pt requested to speak w/ RN. Please call back and discuss.

## 2014-11-20 NOTE — Telephone Encounter (Signed)
Spoke with pt.  I cannot see what was actually placed on the clearance form but based on his risk factors, he should not need Lovenox bridging.  He states he has never done this in the past.  Will hold Coumadin x 5 days prior to procedure.

## 2014-11-20 NOTE — Telephone Encounter (Signed)
lmom that this form was faxed 08/20/14 but we will fax again today

## 2014-11-22 ENCOUNTER — Ambulatory Visit: Payer: Self-pay | Admitting: Orthopedic Surgery

## 2014-11-22 NOTE — Progress Notes (Signed)
Preoperative surgical orders have been place into the Epic hospital system for Steve Anthony on 11/22/2014, 5:13 PM  by Mickel Crow for surgery on 12-18-2014.  Preop Total Hip - Anterior Approach orders including IV Tylenol, and IV Decadron as long as there are no contraindications to the above medications. Arlee Muslim, PA-C

## 2014-11-25 ENCOUNTER — Encounter: Payer: Self-pay | Admitting: Internal Medicine

## 2014-11-26 LAB — CUP PACEART REMOTE DEVICE CHECK: Date Time Interrogation Session: 20161014213555

## 2014-11-26 NOTE — Progress Notes (Signed)
Carelink summary report received. Battery status OK. Normal device function. No new symptom episodes, tachy episodes, brady, or pause episodes. 18 AF episodes (burden 0.2%), +warfarin, avg V rates controlled. Monthly summary reports and ROV with JA in 11/2014 (recall letter sent).

## 2014-12-04 ENCOUNTER — Encounter: Payer: Medicare PPO | Admitting: Internal Medicine

## 2014-12-09 ENCOUNTER — Ambulatory Visit (INDEPENDENT_AMBULATORY_CARE_PROVIDER_SITE_OTHER): Payer: Medicare PPO | Admitting: *Deleted

## 2014-12-09 DIAGNOSIS — I480 Paroxysmal atrial fibrillation: Secondary | ICD-10-CM | POA: Diagnosis not present

## 2014-12-09 NOTE — Progress Notes (Signed)
Carelink summary report / loop recorder

## 2014-12-09 NOTE — Patient Instructions (Signed)
Steve Anthony  12/09/2014   Your procedure is scheduled on: 12/18/2014    Report to [facility] Main  Entrance take [facility]  elevators to 3rd floor to  [place] at AM.  Call this number if you have problems the morning of surgery [phone]   Remember: ONLY 1 PERSON MAY GO WITH YOU TO SHORT STAY TO GET  READY MORNING OF [place].  Do not eat foodafter midnite.  May have clear liquids from 12 midnite until 0730am morning of surgery then nothing by mouth.               Eat a good healthy snack prior to bedtime.       Take these medicines the morning of surgery with A SIP OF WATER:   Combivent Inhaler and bring, Clonidine ( catapres0, Hydrocodone if needed, Metoprolol ( toprol), Protonix, Ocean Nasal Spray.   DO NOT TAKE ANY DIABETIC MEDICATIONS DAY OF YOUR SURGERY                               You may not have any metal on your body including hair pins and              piercings  Do not wear jewelry,  lotions, powders or perfumes, deodorant                         Men may shave face and neck.   Do not bring valuables to the hospital. [place].  Contacts, dentures or bridgework may not be worn into surgery.  Leave suitcase in the car. After surgery it may be brought to your room.       Special Instructions: coughing and deep breathing exercises, leg exercises               Please read over the following fact sheets you were given: _____________________________________________________________________                CLEAR LIQUID DIET   Foods Allowed                                                                     Foods Excluded  Coffee and tea, regular and decaf                             liquids that you cannot  Plain Jell-O in any flavor                                             see through such as: Fruit ices (not with fruit pulp)  milk, soups,  orange juice  Iced Popsicles                                    All solid food Carbonated beverages, regular and diet                                    Cranberry, grape and apple juices Sports drinks like Gatorade Lightly seasoned clear broth or consume(fat free) Sugar, honey syrup  Sample Menu Breakfast                                Lunch                                     Supper Cranberry juice                    Beef broth                            Chicken broth Jell-O                                     Grape juice                           Apple juice Coffee or tea                        Jell-O                                      Popsicle                                                Coffee or tea                        Coffee or tea  _____________________________________________________________________  [facility] - Preparing for Surgery Before surgery, you can play an important role.  Because skin is not sterile, your skin needs to be as free of germs as possible.  You can reduce the number of germs on your skin by washing with CHG (chlorahexidine gluconate) soap before surgery.  CHG is an antiseptic cleaner which kills germs and bonds with the skin to continue killing germs even after washing. Please DO NOT use if you have an allergy to CHG or antibacterial soaps.  If your skin becomes reddened/irritated stop using the CHG and inform your nurse when you arrive at Short Stay. Do not shave (including legs and underarms) for at least 48 hours prior to the first CHG shower.  You may shave your face/neck. Please follow these instructions carefully:  1.  Shower with CHG Soap the night before surgery and the  morning of Surgery.  2.  If you choose to  wash your hair, wash your hair first as usual with your  normal  shampoo.  3.  After you shampoo, rinse your hair and body thoroughly to remove the  shampoo.                           4.  Use CHG as you would any other liquid soap.  You  can apply chg directly  to the skin and wash                       Gently with a scrungie or clean washcloth.  5.  Apply the CHG Soap to your body ONLY FROM THE NECK DOWN.   Do not use on face/ open                           Wound or open sores. Avoid contact with eyes, ears mouth and genitals (private parts).                       Wash face,  Genitals (private parts) with your normal soap.             6.  Wash thoroughly, paying special attention to the area where your surgery  will be performed.  7.  Thoroughly rinse your body with warm water from the neck down.  8.  DO NOT shower/wash with your normal soap after using and rinsing off  the CHG Soap.                9.  Pat yourself dry with a clean towel.            10.  Wear clean pajamas.            11.  Place clean sheets on your bed the night of your first shower and do not  sleep with pets. Day of Surgery : Do not apply any lotions/deodorants the morning of surgery.  Please wear clean clothes to the hospital/surgery center.  FAILURE TO FOLLOW THESE INSTRUCTIONS MAY RESULT IN THE CANCELLATION OF YOUR SURGERY PATIENT SIGNATURE_________________________________  NURSE SIGNATURE__________________________________  ________________________________________________________________________  WHAT IS A BLOOD TRANSFUSION? Blood Transfusion Information  A transfusion is the replacement of blood or some of its parts. Blood is made up of multiple cells which provide different functions.  Red blood cells carry oxygen and are used for blood loss replacement.  White blood cells fight against infection.  Platelets control bleeding.  Plasma helps clot blood.  Other blood products are available for specialized needs, such as hemophilia or other clotting disorders. BEFORE THE TRANSFUSION  Who gives blood for transfusions?   Healthy volunteers who are fully evaluated to make sure their blood is safe. This is blood bank blood. Transfusion therapy  is the safest it has ever been in the practice of medicine. Before blood is taken from a donor, a complete history is taken to make sure that person has no history of diseases nor engages in risky social behavior (examples are intravenous drug use or sexual activity with multiple partners). The donor's travel history is screened to minimize risk of transmitting infections, such as malaria. The donated blood is tested for signs of infectious diseases, such as HIV and hepatitis. The blood is then tested to be sure it is compatible with you in order to minimize the chance of a transfusion reaction. If you or  a relative donates blood, this is often done in anticipation of surgery and is not appropriate for emergency situations. It takes many days to process the donated blood. RISKS AND COMPLICATIONS Although transfusion therapy is very safe and saves many lives, the main dangers of transfusion include:  1. Getting an infectious disease. 2. Developing a transfusion reaction. This is an allergic reaction to something in the blood you were given. Every precaution is taken to prevent this. The decision to have a blood transfusion has been considered carefully by your caregiver before blood is given. Blood is not given unless the benefits outweigh the risks. AFTER THE TRANSFUSION  Right after receiving a blood transfusion, you will usually feel much better and more energetic. This is especially true if your red blood cells have gotten low (anemic). The transfusion raises the level of the red blood cells which carry oxygen, and this usually causes an energy increase.  The nurse administering the transfusion will monitor you carefully for complications. HOME CARE INSTRUCTIONS  No special instructions are needed after a transfusion. You may find your energy is better. Speak with your caregiver about any limitations on activity for underlying diseases you may have. SEEK MEDICAL CARE IF:   Your condition is not  improving after your transfusion.  You develop redness or irritation at the intravenous (IV) site. SEEK IMMEDIATE MEDICAL CARE IF:  Any of the following symptoms occur over the next 12 hours:  Shaking chills.  You have a temperature by mouth above 102 F (38.9 C), not controlled by medicine.  Chest, back, or muscle pain.  People around you feel you are not acting correctly or are confused.  Shortness of breath or difficulty breathing.  Dizziness and fainting.  You get a rash or develop hives.  You have a decrease in urine output.  Your urine turns a dark color or changes to pink, red, or brown. Any of the following symptoms occur over the next 10 days:  You have a temperature by mouth above 102 F (38.9 C), not controlled by medicine.  Shortness of breath.  Weakness after normal activity.  The white part of the eye turns yellow (jaundice).  You have a decrease in the amount of urine or are urinating less often.  Your urine turns a dark color or changes to pink, red, or brown. Document Released: 01/09/2000 Document Revised: 04/05/2011 Document Reviewed: 08/28/2007 ExitCare Patient Information 2014 [place].  _______________________________________________________________________  Incentive Spirometer  An incentive spirometer is a tool that can help keep your lungs clear and active. This tool measures how well you are filling your lungs with each breath. Taking long deep breaths may help reverse or decrease the chance of developing breathing (pulmonary) problems (especially infection) following:  A long period of time when you are unable to move or be active. BEFORE THE PROCEDURE   If the spirometer includes an indicator to show your best effort, your nurse or respiratory therapist will set it to a desired goal.  If possible, sit up straight or lean slightly forward. Try not to slouch.  Hold the incentive spirometer in an upright position. INSTRUCTIONS FOR  USE  3. Sit on the edge of your bed if possible, or sit up as far as you can in bed or on a chair. 4. Hold the incentive spirometer in an upright position. 5. Breathe out normally. 6. Place the mouthpiece in your mouth and seal your lips tightly around it. 7. Breathe in slowly and as deeply as possible,  raising the piston or the ball toward the top of the column. 8. Hold your breath for 3-5 seconds or for as long as possible. Allow the piston or ball to fall to the bottom of the column. 9. Remove the mouthpiece from your mouth and breathe out normally. 10. Rest for a few seconds and repeat Steps 1 through 7 at least 10 times every 1-2 hours when you are awake. Take your time and take a few normal breaths between deep breaths. 11. The spirometer may include an indicator to show your best effort. Use the indicator as a goal to work toward during each repetition. 12. After each set of 10 deep breaths, practice coughing to be sure your lungs are clear. If you have an incision (the cut made at the time of surgery), support your incision when coughing by placing a pillow or rolled up towels firmly against it. Once you are able to get out of bed, walk around indoors and cough well. You may stop using the incentive spirometer when instructed by your caregiver.  RISKS AND COMPLICATIONS  Take your time so you do not get dizzy or light-headed.  If you are in pain, you may need to take or ask for pain medication before doing incentive spirometry. It is harder to take a deep breath if you are having pain. AFTER USE  Rest and breathe slowly and easily.  It can be helpful to keep track of a log of your progress. Your caregiver can provide you with a simple table to help with this. If you are using the spirometer at home, follow these instructions: [place] IF:   You are having difficultly using the spirometer.  You have trouble using the spirometer as often as instructed.  Your pain medication  is not giving enough relief while using the spirometer.  You develop fever of 100.5 F (38.1 C) or higher. SEEK IMMEDIATE MEDICAL CARE IF:   You cough up bloody sputum that had not been present before.  You develop fever of 102 F (38.9 C) or greater.  You develop worsening pain at or near the incision site. MAKE SURE YOU:   Understand these instructions.  Will watch your condition.  Will get help right away if you are not doing well or get worse. Document Released: 05/24/2006 Document Revised: 04/05/2011 Document Reviewed: 07/25/2006 [facility] Patient Information 2014 [place], [place].   ________________________________________________________________________

## 2014-12-10 ENCOUNTER — Encounter (HOSPITAL_COMMUNITY)
Admission: RE | Admit: 2014-12-10 | Discharge: 2014-12-10 | Disposition: A | Discharge status: Home / Self Care | Payer: Medicare PPO | Source: Ambulatory Visit | Attending: Orthopedic Surgery | Admitting: Orthopedic Surgery

## 2014-12-10 ENCOUNTER — Encounter (INDEPENDENT_AMBULATORY_CARE_PROVIDER_SITE_OTHER): Payer: Self-pay

## 2014-12-10 ENCOUNTER — Encounter (HOSPITAL_COMMUNITY): Payer: Self-pay

## 2014-12-10 DIAGNOSIS — Z01812 Encounter for preprocedural laboratory examination: Secondary | ICD-10-CM | POA: Insufficient documentation

## 2014-12-10 LAB — COMPREHENSIVE METABOLIC PANEL
ALT: 26 U/L (ref 17–63)
AST: 29 U/L (ref 15–41)
Albumin: 4.2 g/dL (ref 3.5–5.0)
Alkaline Phosphatase: 34 U/L — ABNORMAL LOW (ref 38–126)
Anion gap: 8 (ref 5–15)
BUN: 13 mg/dL (ref 6–20)
CALCIUM: 8.9 mg/dL (ref 8.9–10.3)
CHLORIDE: 101 mmol/L (ref 101–111)
CO2: 29 mmol/L (ref 22–32)
CREATININE: 0.98 mg/dL (ref 0.61–1.24)
Glucose, Bld: 145 mg/dL — ABNORMAL HIGH (ref 65–99)
Potassium: 3.5 mmol/L (ref 3.5–5.1)
Sodium: 138 mmol/L (ref 135–145)
Total Bilirubin: 0.7 mg/dL (ref 0.3–1.2)
Total Protein: 7 g/dL (ref 6.5–8.1)

## 2014-12-10 LAB — URINALYSIS, ROUTINE W REFLEX MICROSCOPIC
Bilirubin Urine: NEGATIVE
Glucose, UA: NEGATIVE mg/dL
Hgb urine dipstick: NEGATIVE
Ketones, ur: NEGATIVE mg/dL
LEUKOCYTES UA: NEGATIVE
NITRITE: NEGATIVE
PROTEIN: NEGATIVE mg/dL
SPECIFIC GRAVITY, URINE: 1.004 — AB (ref 1.005–1.030)
pH: 6 (ref 5.0–8.0)

## 2014-12-10 LAB — CBC
HCT: 42 % (ref 39.0–52.0)
Hemoglobin: 13.7 g/dL (ref 13.0–17.0)
MCH: 27.6 pg (ref 26.0–34.0)
MCHC: 32.6 g/dL (ref 30.0–36.0)
MCV: 84.5 fL (ref 78.0–100.0)
PLATELETS: 136 10*3/uL — AB (ref 150–400)
RBC: 4.97 MIL/uL (ref 4.22–5.81)
RDW: 14 % (ref 11.5–15.5)
WBC: 6.7 10*3/uL (ref 4.0–10.5)

## 2014-12-10 LAB — APTT: aPTT: 32 seconds (ref 24–37)

## 2014-12-10 LAB — ABO/RH: ABO/RH(D): AB POS

## 2014-12-10 LAB — PROTIME-INR
INR: 2.06 — AB (ref 0.00–1.49)
PROTHROMBIN TIME: 23.1 s — AB (ref 11.6–15.2)

## 2014-12-10 LAB — SURGICAL PCR SCREEN
MRSA, PCR: NEGATIVE
STAPHYLOCOCCUS AUREUS: POSITIVE — AB

## 2014-12-10 NOTE — Progress Notes (Signed)
PT/INR done 12/10/2014 faxed to Dr Wynelle Link.

## 2014-12-10 NOTE — Progress Notes (Signed)
After blood pressure elevation at time of preop appointment called PCP office of Dr Bea Graff and spoke with staff member.  Patient was in office of PCP on on 12/04/2014.  Blood pressure at that time was 134/86.  Office faxed to me LOV note from 12/04/2014.  Placed on chart.  Blood pressure initial was 179/103 at 1300pm.  Recheck on blood pressure was 176/111.  AT 1400pm- blood pressure 172/101. Patient reported he was  In pain and did not bring any pain medication with him nor did he take lunch time dose of blood pressure medication.    Reported to Dr Gifford Shave in anesthesia.  No further orders given except to instruct patient to take blood pressure medications as instructed.  Faxed to office of Dr Bea Graff and received confirmation of blood pressure readings.  Instructed patient to monitor blood pressure at home and to report to Dr Bea Graff blood pressure readings.  I called office of Dr Bea Graff and reported this to them.

## 2014-12-10 NOTE — Progress Notes (Signed)
EKG-06/07/14-EPIC  Last device check- 12/09/14- EPIC  12/10/2013- LOV with DR Allred in EPIC  Clearance on chart- Dr Bea Graff ( PCP) and clearance Dr Rayann Heman on chart.   LOV 12/04/2014 with Dr Bea Graff on chart.

## 2014-12-15 NOTE — Progress Notes (Signed)
Cardiology Office Note   Date:  12/16/2014   ID:  Steve Anthony, DOB [birth year], MRN [id number]  PCP:  [name], MD  Cardiologist:  Dr. Rayann Heman    Chief Complaint  Patient presents with  . Hypertension      History of Present Illness: Steve Anthony is a 64 y.o. male who presents for HTN needing improved control prior to surgery.  He is having Rt hip surgery on Wed. With Dr. Maureen Ralphs,  His BP has been elevated.  And Dr. Lilli Few increased his losartan on Thursday of last week.  Since that time his bp even today 152/91, 192/111 and here in office stable at 132/90.  He is on clonidine, prn lasix, losartan, metoprolol, and spironolactone.    Previous BPs in our office 150/98 and in 11/2013 BP 120/90.    Hx of atrial fibrillation s/p PVI ablation 2011, PSVT s/p EPS +RF ablation of AVNRT, CAD, COPD and OSA who underwent ILR insertion in Oct 2014 for further evaluation of palpitations.  Cardiac cath Oct 2014 revealed stable CAD with patent stent to midLAD.  Pt on coumadin- now on hold for surgery.  He has been cleared by Dr. Rayann Heman.  Today he denies chest pain, no SOB, no palpitations. .  Past Medical History  Diagnosis Date  . Paroxysmal a-fib ([place])     a. S/P PVI 2/11 and 9/11  . SVT (supraventricular tachycardia) ([place])     a. Atypical AVNRT of the slow pathway  . GERD (gastroesophageal reflux disease)   . Nephrolithiasis   . Dyslipidemia   . Hypertension   . CAD (coronary artery disease)     a. S/P PCI mid LAD (vision stent) 08/31/2004;  b. repeat cath 6/11 - no progression of CAD. c. Cath 10/2012 given moderate CAD on cardiac CT - no change from prior cath.  Marland Kitchen COPD (chronic obstructive pulmonary disease) ([place])   . Hyperthyroidism   . Type II diabetes mellitus ([place])   . GXQJJHER(740.8)     "sometimes weekly" (11/08/2012)  . DJD (degenerative joint disease) of lumbar spine   . Arthritis     "in my back and right leg" (11/08/2012)  . Chronic lower back pain   . On home oxygen  therapy     "2L q hs" (11/08/2012)  . Palpitations     a. 10/2012 - s/p LINQ loop recorder to assess for arrhythmia.   Marland Kitchen Hypomagnesemia     a. 10/2012 - Mg 0.7.  . Hypokalemia   . Shortness of breath dyspnea     with exertion   . Cancer ([place])     melanoma removed from neck   . OSA (obstructive sleep apnea)     a. cpap noncompliance- patient reports on 12/10/14- does not use CPAP    Past Surgical History  Procedure Laterality Date  . Lumbar disc surgery  2000; 2002  . Atrial ablation surgery  2007    S/P slow pathway ablation for atypical AVNRT   . Ep study  2008    Negative EP study in [place]  . Pvi/ cti ablation  02/25/2009 and 09/2009    S/P afib/ CTI ablation  . Tibia fracture surgery Right 1990    "broke in 3 places; had rod put in" (11/08/2012)  . Tibia hardware removal Right ~ 1991  . Coronary angioplasty with stent placement  08/30/2004    Vision; to LAD   . Posterior lumbar fusion  2004  . Tonsillectomy  1965  . Cholecystectomy  ~  2008  . Fracture surgery    . Prostate surgery  ~ 2009  . Cystoscopy w/ stone manipulation  ~ 1998    "once" (11/08/2012)  . Loop recorder implant  11/10/2012    Medtronic LinQ implanted by Dr Rayann Heman for afib management  . Loop recorder implant N/A 11/10/2012    Procedure: LOOP RECORDER IMPLANT;  Surgeon: Coralyn Mark, MD;  Location: [facility] CATH LAB;  Service: Cardiovascular;  Laterality: N/A;  . Left heart catheterization with coronary angiogram N/A 11/10/2012    Procedure: LEFT HEART CATHETERIZATION WITH CORONARY ANGIOGRAM;  Surgeon: Burnell Blanks, MD;  Location: [facility] CATH LAB;  Service: Cardiovascular;  Laterality: N/A;  . Colon resection  02/2013      Current Outpatient Prescriptions  Medication Sig Dispense Refill  . albuterol-ipratropium (COMBIVENT) 18-103 MCG/ACT inhaler Inhale 2 puffs into the lungs every 4 (four) hours as needed for wheezing or shortness of breath.     Marland Kitchen aspirin EC 81 MG EC tablet Take 1 tablet (81 mg  total) by mouth daily.    Marland Kitchen atorvastatin (LIPITOR) 40 MG tablet Take 40 mg by mouth at bedtime.     . clonazePAM (KLONOPIN) 1 MG tablet TAKE 1 TABLET BY MOUTH EVERY NIGHT AT BEDTIME  1  . cloNIDine (CATAPRES) 0.2 MG tablet Take 0.2-0.4 mg by mouth 3 (three) times daily. Take 0.22m by mouth in the morning, 0.458mby mouth at lunch, and 0.5m92my mouth at dinner    . cyanocobalamin 1000 MCG tablet Take 1,000 mcg by mouth daily.     . dMarland Kitchencusate sodium (COLACE) 100 MG capsule Take 100 mg by mouth daily.    . fenofibrate 160 MG tablet Take 160 mg by mouth daily.      . fentaNYL (DURAGESIC - DOSED MCG/HR) 25 MCG/HR patch Place 1 patch onto the skin every 3 (three) days.  0  . furosemide (LASIX) 20 MG tablet Take 20 mg by mouth daily as needed for fluid.     . HMarland KitchenDROcodone-acetaminophen (NORCO) 7.5-325 MG per tablet Take 1-2 tablets by mouth every 6 (six) hours as needed for moderate pain.    . lMarland Kitchensartan (COZAAR) 50 MG tablet Take 100 mg by mouth daily.     . Melatonin 5 MG TABS Take 5 mg by mouth at bedtime.    . metFORMIN (GLUCOPHAGE-XR) 500 MG 24 hr tablet Take 2 tablets (1,000 mg total) by mouth 2 (two) times daily.    . metoprolol succinate (TOPROL-XL) 50 MG 24 hr tablet TAKE 1 TABLET BY MOUTH TWICE A DAY 60 tablet 3  . nitroGLYCERIN (NITROSTAT) 0.4 MG SL tablet Place 0.4 mg under the tongue every 5 (five) minutes as needed for chest pain. May repeat up to 3 doses.    . nMarland Kitchenstatin (MYCOSTATIN/NYSTOP) 100000 UNIT/GM POWD APPLY 2-3 TIMES DAILY TO AFFECTED AREA(S) as needed for itching.  0  . pantoprazole (PROTONIX) 40 MG tablet Take 40 mg by mouth daily.      . sodium chloride (OCEAN) 0.65 % SOLN nasal spray Place 1 spray into both nostrils every 6 (six) hours as needed for congestion.    . sMarland Kitchenironolactone (ALDACTONE) 25 MG tablet Take 25 mg by mouth daily.  1  . Tamsulosin HCl (FLOMAX) 0.4 MG CAPS Take 0.4 mg by mouth daily.      . wMarland Kitchenrfarin (COUMADIN) 5 MG tablet Take 5-7.5 mg by mouth daily. Takes 5mg51maily except 7.5mg 86mMWF.     No current facility-administered medications for this visit.  Allergies:   Penicillins    Social History:  The patient  reports that he quit smoking about 10 years ago. His smoking use included Cigarettes. He has a 114 pack-year smoking history. He quit smokeless tobacco use about 10 years ago. His smokeless tobacco use included Chew. He reports that he drinks alcohol. He reports that he does not use illicit drugs.   Family History:  The patient's family history includes Diabetes in his father; Emphysema in his maternal grandfather and mother; Heart attack in his father; Heart disease in his father and paternal grandfather; Hypertension in his father.    ROS:  General:no colds or fevers, no weight changes Skin:no rashes or ulcers HEENT:no blurred vision, no congestion CV:see HPI PUL:see HPI GI:no diarrhea constipation or melena, no indigestion GU:no hematuria, no dysuria MS:+ hip pain  joint pain, no claudication Neuro:no syncope, no lightheadedness Endo:no diabetes, no thyroid disease  Wt Readings from Last 3 Encounters:  12/16/14 266 lb (120.657 kg)  12/10/14 265 lb (120.203 kg)  06/07/14 266 lb 6.4 oz (120.838 kg)     PHYSICAL EXAM: VS:  BP 132/90 mmHg  Pulse 68  Ht 6' (1.829 m)  Wt 266 lb (120.657 kg)  BMI 36.07 kg/m2 , BMI Body mass index is 36.07 kg/(m^2). General:Pleasant affect, NAD Skin:Warm and dry, brisk capillary refill HEENT:normocephalic, sclera clear, mucus membranes moist Neck:supple, no JVD, no bruits  Heart:S1S2 RRR without murmur, gallup, rub or click Lungs:clear without rales, rhonchi, or wheezes FIE:PPIR, non tender, + BS, do not palpate liver spleen or masses Ext:no lower ext edema, 2+ pedal pulses, 2+ radial pulses Neuro:alert and oriented, MAE, follows commands, + facial symmetry    EKG:  EKG is NOT ordered today.   Recent Labs: 12/10/2014: ALT 26; BUN 13; Creatinine, Ser 0.98; Hemoglobin 13.7; Platelets  136*; Potassium 3.5; Sodium 138    Lipid Panel    Component Value Date/Time   CHOL 103 11/09/2012 0230   TRIG 289* 11/09/2012 0230   HDL 23* 11/09/2012 0230   CHOLHDL 4.5 11/09/2012 0230   VLDL 58* 11/09/2012 0230   LDLCALC 22 11/09/2012 0230       Other studies Reviewed: Additional studies/ records that were reviewed today include: previous notes. Echo 2014  Study Conclusions  - Left ventricle: The cavity size was normal. Wall thickness was increased in a pattern of mild LVH with mild to moderate basal septal hypertrophy. Systolic function was normal. The estimated ejection fraction was in the range of 55% to 60%. Wall motion was normal; there were no regional wall motion abnormalities. Doppler parameters are consistent with abnormal left ventricular relaxation (grade 1 diastolic dysfunction). - Aortic valve: Mildly calcified annulus. Probably trileaflet. Trivial regurgitation. - Aortic root: The aortic root was mildly ectatic based on BSA. - Ascending aorta: The ascending aorta was mildly dilated. - Mitral valve: Trivial regurgitation. - Left atrium: The atrium was moderately dilated. - Right ventricle: The cavity size was at the upper limits of normal. Systolic function was normal. - Right atrium: The atrium was mildly dilated. Central venous pressure: 27m Hg (est). - Tricuspid valve: Trivial regurgitation. - Pulmonary arteries: Systolic pressure could not be accurately estimated. - Pericardium, extracardiac: There was no pericardial effusion. Impressions:  - Comparison to prior study June 2011. Mild LVH with mild to moderate basal septal hypertrophy, LVEF 551-88% grade 1 diastolic dysfunction. Moderate left atrial enlargement. Right upper pulmonary vein Doppler signal incomplete. Trivial mitral regurgitation. Mildly dilated aortic root and ascending aorta based on BSA. Mild  right atrial enlargement. Trivial tricuspid  regurgitation. Unable to assess PASP, CVP appears normal range.  ASSESSMENT AND PLAN:  1.  HTN uncontrolled, will add amlodipine 5 mg daily.  BP should have improved control for surgery. some of BP elevation may be due to pain as well.  He will keep follow up apppt with Dr. Rayann Heman.  2. PAF no recurrence, off coumadin for now for surgery.  3. CAD stable cath 2014, with patent stent to mLAD   Current medicines are reviewed with the patient today.  The patient Has no concerns regarding medicines.  The following changes have been made:  See above Labs/ tests ordered today include:see above  Disposition:   FU:  see above  Signed, Steve Serge, Steve Anthony  12/16/2014 9:57 AM    [facility] [address], [place], [place], [place] Phone: [phone]; Fax: [phone]

## 2014-12-16 ENCOUNTER — Ambulatory Visit (INDEPENDENT_AMBULATORY_CARE_PROVIDER_SITE_OTHER): Payer: Medicare PPO | Admitting: Cardiology

## 2014-12-16 ENCOUNTER — Encounter: Payer: Medicare PPO | Admitting: Internal Medicine

## 2014-12-16 ENCOUNTER — Encounter: Payer: Self-pay | Admitting: Cardiology

## 2014-12-16 VITALS — BP 132/90 | HR 68 | Ht 72.0 in | Wt 266.0 lb

## 2014-12-16 DIAGNOSIS — I10 Essential (primary) hypertension: Secondary | ICD-10-CM

## 2014-12-16 DIAGNOSIS — I480 Paroxysmal atrial fibrillation: Secondary | ICD-10-CM | POA: Diagnosis not present

## 2014-12-16 DIAGNOSIS — I2583 Coronary atherosclerosis due to lipid rich plaque: Secondary | ICD-10-CM

## 2014-12-16 DIAGNOSIS — I2510 Atherosclerotic heart disease of native coronary artery without angina pectoris: Secondary | ICD-10-CM | POA: Diagnosis not present

## 2014-12-16 LAB — TYPE AND SCREEN
ABO/RH(D): AB POS
Antibody Screen: NEGATIVE

## 2014-12-16 MED ORDER — AMLODIPINE BESYLATE 5 MG PO TABS: 5.0000 mg | ORAL_TABLET | Freq: Every day | ORAL | Status: DC

## 2014-12-16 NOTE — Patient Instructions (Signed)
Medication Instructions:  1) START Amlodipine 8m once daily  Labwork: None  Testing/Procedures: None  Follow-Up: Your physician recommends that you schedule a follow-up appointment in: 1 month with a NP or PA.   Any Other Special Instructions Will Be Listed Below (If Applicable).     If you need a refill on your cardiac medications before your next appointment, please call your pharmacy.

## 2014-12-16 NOTE — Progress Notes (Signed)
Patient called in to state that he had been seen by his PCP and they sent him to cardiologist and he had been seen by NP - Note in EPIC dated 12/16/2014 regarding blood presssure issues.  Patient stated his Losartan had been increased to 100 mg daily and that they added Amlodipine ( Norvasc) 5 mg daily which he started the Amlodipine today at 12 noon per patient Patient was instructed to take amlodipine ( Norvasc) am of surgery and not to take Losartan am of surgery.  Patient voiced understanding.  Patient also has his preop instructions at home in regards to other medications to take am of surgery.  Patient voiced understanding.

## 2014-12-17 ENCOUNTER — Ambulatory Visit: Payer: Self-pay | Admitting: Orthopedic Surgery

## 2014-12-17 MED ORDER — VANCOMYCIN HCL 10 G IV SOLR
1500.0000 mg | INTRAVENOUS | Status: AC
  Administered 2014-12-18 (×2): 1500 mg via INTRAVENOUS
  Filled 2014-12-17: qty 1500

## 2014-12-17 NOTE — H&P (Signed)
[name] DOB: 10-28-1950 Married / Language: English / Race: White Male Date of Admission:  12/18/2014 CC:  Right Hip Pain History of Present Illness The patient is a 64 year old male who comes in for a preoperative History and Physical. The patient is scheduled for a right total hip arthroplasty (anterior) to be performed by Dr. Dione Plover. Aluisio, MD at [facility] on 12-18-2014. The patient is a 64 year old male who presents with a hip problem. The patient was seen for a second opinion.The patient reports right lateral hip, right posterior hip and right anterior hip problems including pain and stiffness symptoms that have been present for 1 year(s). The symptoms began without any known injury. Symptoms reported include hip pain, pain with weightbearing, stiffness, difficulty flexing hip, difficulty rotating hip, difficulty bearing weight and difficulty ambulatingThe symptoms are described as moderate in severity.The patient feels as if their symptoms are does feel they are worsening. Symptoms are exacerbated by flexing hip, weight bearing and walking. Symptoms are relieved by rest and opioid analgesics. Prior to being seen, the patient was previously evaluated by an out of town physician Event organiser and had an injection in his right hip. This helped for 1 day) 3 month(s) ago. He states he is not positive that the pain is coming from his hip and is hard to differentiate the hip pain from back pain. He does have real significant discomfort in his groin radiating down his anterior thigh. Occasionally, it will go all the way down the leg. He also has real significant low back pain. He is not having any left hip pain. This has all gone progressively worse in the past six months. Pain has been present for a year, but it has worsened significantly in six months. It is hurting him at night now. It is limiting what he can and cannot do. The intra-articular injection provided very short term  benefit. He is ready to get the hip fixed. They have been treated conservatively in the past for the above stated problem and despite conservative measures, they continue to have progressive pain and severe functional limitations and dysfunction. They have failed non-operative management including home exercise, medications, and injections. It is felt that they would benefit from undergoing total joint replacement. Risks and benefits of the procedure have been discussed with the patient and they elect to proceed with surgery. There are no active contraindications to surgery such as ongoing infection or rapidly progressive neurological disease.  Problem List/Past Medical  Primary osteoarthritis of right hip (M16.11) Sleep Apnea has a CPAP but does not use at home currently but does have oxygen at night Ulcer disease Kidney Stone Asthma Gastroesophageal Reflux Disease Coronary Artery Disease/Heart Disease High blood pressure Congestive Heart Failure Hypercholesterolemia Diverticulitis Of Colon Chronic Obstructive Lung Disease Diabetes Mellitus, Type II Varicose veins Hemorrhoids Measles Mumps  Allergies Penicillin G Pot in Dextrose *PENICILLINS* Hives, Difficulty breathing. Whelps  Family History  Diabetes Mellitus father Hypertension father Cerebrovascular Accident father  Social History Current work status retired Engineer, agricultural (Currently) no Drug/Alcohol Rehab (Previously) no Children 1 Alcohol use never consumed alcohol Tobacco use never smoker Number of flights of stairs before winded 4-5 Exercise Exercises daily Illicit drug use no Marital status married Clinical research associate Will, Healthcare POA  Medication History Aspirin (81MG Tablet DR, Oral) Active. Furosemide (20MG Tablet, Oral) Active. Pantoprazole Sodium (40MG Tablet DR, Oral) Active. Atorvastatin Calcium (40MG Tablet, Oral) Active. Tamsulosin HCl (0.4MG Capsule,  Oral) Active. Fenofibrate (160MG  Tablet, Oral) Active. Losartan Potassium (50MG Tablet, Oral) Active. MetFORMIN HCl ER (500MG Tablet ER 24HR, Oral) Active. Metoprolol Succinate ER (50MG Tablet ER 24HR, Oral) Active. CloNIDine HCl (0.2MG Tablet, Oral) Active. Warfarin Sodium (5MG Tablet, Oral) Active. Hydrocodone-Acetaminophen (7.5-325MG Tablet, Oral) Active. FentaNYL (25MCG/HR Patch 72HR, Transdermal) Active. Zolpidem Tartrate ER (12.5MG Tablet ER, Oral) Active. B-12 Active. Stool Softner Active. Spironolactone Active. Clonazepam Active. Melatonin Active. NitroStat Active.  Past Surgical History Heart Stents One Spinal Surgery Tonsillectomy Partial Colectomy secondary to Diverticulitis  Review of Systems General Not Present- Chills, Fatigue, Fever, Memory Loss, Night Sweats, Weight Gain and Weight Loss. Skin Not Present- Eczema, Hives, Itching, Lesions and Rash. HEENT Present- Headache. Not Present- Dentures, Double Vision, Hearing Loss, Tinnitus and Visual Loss. Respiratory Not Present- Allergies, Chronic Cough, Coughing up blood, Shortness of breath at rest and Shortness of breath with exertion. Cardiovascular Not Present- Chest Pain, Difficulty Breathing Lying Down, Murmur, Palpitations, Racing/skipping heartbeats and Swelling. Gastrointestinal Not Present- Abdominal Pain, Bloody Stool, Constipation, Diarrhea, Difficulty Swallowing, Heartburn, Jaundice, Loss of appetitie, Nausea and Vomiting. Male Genitourinary Not Present- Blood in Urine, Discharge, Flank Pain, Incontinence, Painful Urination, Urgency, Urinary frequency, Urinary Retention, Urinating at Night and Weak urinary stream. Musculoskeletal Present- Back Pain and Joint Pain. Not Present- Joint Swelling, Morning Stiffness, Muscle Pain, Muscle Weakness and Spasms. Neurological Not Present- Blackout spells, Difficulty with balance, Dizziness, Paralysis, Tremor and Weakness. Psychiatric Not Present-  Insomnia.  Vitals Weight: 257 lb Height: 72in Weight was reported by patient. Height was reported by patient. Body Surface Area: 2.37 m Body Mass Index: 34.86 kg/m  BP: 188/98 (Sitting, Left Arm, Standard)  Physical Exam General Mental Status -Alert, cooperative and good historian. General Appearance-pleasant, Not in acute distress. Orientation-Oriented X3. Build & Nutrition-Well nourished and Well developed.  Head and Neck Head-normocephalic, atraumatic . Neck Global Assessment - supple, no bruit auscultated on the right, no bruit auscultated on the left.  Eye Pupil - Bilateral-Regular and Round. Motion - Bilateral-EOMI.  ENMT Note: upper and lower dentures   Chest and Lung Exam Auscultation Breath sounds - clear at anterior chest wall and clear at posterior chest wall. Adventitious sounds - No Adventitious sounds.  Cardiovascular Auscultation Rhythm - Regular rate and rhythm. Heart Sounds - S1 WNL and S2 WNL. Murmurs & Other Heart Sounds - Auscultation of the heart reveals - No Murmurs.  Abdomen Inspection Contour - Generalized moderate distention. Palpation/Percussion Tenderness - Abdomen is non-tender to palpation. Rigidity (guarding) - Abdomen is soft. Auscultation Auscultation of the abdomen reveals - Bowel sounds normal.  Male Genitourinary Note: Not done, not pertinent to present illness   Musculoskeletal Note: Well developed male, in no distress. Evaluation of his left hip shows normal range of motion with no discomfort. Right hip shows flexion about 100, rotation in 10 and out 20, abduction 20 with discomfort. He is somewhat tender over the greater trochanter. His knee exam is normal bilaterally. Pulses are intact distally with intact motor.  RADIOGRAPHS His radiographs AP pelvis and lateral of the right hip showed he has bone on bone change in the right hip especially superolateral. He also has osteophyte formation. He is not  concentrically bone on bone, but has significant narrowing concentrically with the focal bone on bone changes.  Assessment & Plan Primary osteoarthritis of right hip (M16.11) Note:Surgical Plans: Right Total Hip Replacement - Anterior Approach  Disposition: Home  Cards: Dr. Thompson Grayer - Patient has been seen preoperatively and felt to be stable for surgery. PCP: Dr. [name] -  Patient has been seen preoperatively and felt to be stable for surgery. " Moderate risk for complications. Keep on a monitored bed, Medical Consult, Inpatient Cardiology consultation, Use DVT prophylaxis and resume Warfarion when able. Monitor O2 Sats."  Topical TXA - CAD, Heart Stent  Anesthesia Issues: None  Signed electronically by Joelene Millin, III PA-C

## 2014-12-18 ENCOUNTER — Inpatient Hospital Stay (HOSPITAL_COMMUNITY): Payer: Medicare PPO

## 2014-12-18 ENCOUNTER — Encounter (HOSPITAL_COMMUNITY): Payer: Self-pay

## 2014-12-18 ENCOUNTER — Inpatient Hospital Stay (HOSPITAL_COMMUNITY)
Admission: RE | Admit: 2014-12-18 | Discharge: 2014-12-20 | DRG: 470 | Disposition: A | Discharge status: Home Health | Payer: Medicare PPO | Source: Ambulatory Visit | Attending: Orthopedic Surgery | Admitting: Orthopedic Surgery

## 2014-12-18 ENCOUNTER — Inpatient Hospital Stay (HOSPITAL_COMMUNITY): Payer: Medicare PPO | Admitting: Anesthesiology

## 2014-12-18 ENCOUNTER — Encounter (HOSPITAL_COMMUNITY)
Admission: RE | Disposition: A | Discharge status: Home Health | Payer: Self-pay | Source: Ambulatory Visit | Attending: Orthopedic Surgery

## 2014-12-18 DIAGNOSIS — Z87891 Personal history of nicotine dependence: Secondary | ICD-10-CM

## 2014-12-18 DIAGNOSIS — I2510 Atherosclerotic heart disease of native coronary artery without angina pectoris: Secondary | ICD-10-CM | POA: Diagnosis present

## 2014-12-18 DIAGNOSIS — Z955 Presence of coronary angioplasty implant and graft: Secondary | ICD-10-CM

## 2014-12-18 DIAGNOSIS — M169 Osteoarthritis of hip, unspecified: Secondary | ICD-10-CM

## 2014-12-18 DIAGNOSIS — M79661 Pain in right lower leg: Secondary | ICD-10-CM | POA: Diagnosis not present

## 2014-12-18 DIAGNOSIS — I10 Essential (primary) hypertension: Secondary | ICD-10-CM | POA: Diagnosis present

## 2014-12-18 DIAGNOSIS — G4733 Obstructive sleep apnea (adult) (pediatric): Secondary | ICD-10-CM | POA: Diagnosis present

## 2014-12-18 DIAGNOSIS — Z9981 Dependence on supplemental oxygen: Secondary | ICD-10-CM | POA: Diagnosis not present

## 2014-12-18 DIAGNOSIS — Z01812 Encounter for preprocedural laboratory examination: Secondary | ICD-10-CM | POA: Diagnosis not present

## 2014-12-18 DIAGNOSIS — E119 Type 2 diabetes mellitus without complications: Secondary | ICD-10-CM | POA: Diagnosis present

## 2014-12-18 DIAGNOSIS — J449 Chronic obstructive pulmonary disease, unspecified: Secondary | ICD-10-CM | POA: Diagnosis present

## 2014-12-18 DIAGNOSIS — Z7982 Long term (current) use of aspirin: Secondary | ICD-10-CM

## 2014-12-18 DIAGNOSIS — I4891 Unspecified atrial fibrillation: Secondary | ICD-10-CM | POA: Diagnosis present

## 2014-12-18 DIAGNOSIS — M1611 Unilateral primary osteoarthritis, right hip: Secondary | ICD-10-CM | POA: Diagnosis present

## 2014-12-18 DIAGNOSIS — K219 Gastro-esophageal reflux disease without esophagitis: Secondary | ICD-10-CM | POA: Diagnosis present

## 2014-12-18 DIAGNOSIS — M25551 Pain in right hip: Secondary | ICD-10-CM | POA: Diagnosis present

## 2014-12-18 DIAGNOSIS — Z79899 Other long term (current) drug therapy: Secondary | ICD-10-CM

## 2014-12-18 DIAGNOSIS — Z7901 Long term (current) use of anticoagulants: Secondary | ICD-10-CM | POA: Diagnosis not present

## 2014-12-18 DIAGNOSIS — Z96649 Presence of unspecified artificial hip joint: Secondary | ICD-10-CM

## 2014-12-18 DIAGNOSIS — Z9119 Patient's noncompliance with other medical treatment and regimen: Secondary | ICD-10-CM | POA: Diagnosis not present

## 2014-12-18 DIAGNOSIS — Z8249 Family history of ischemic heart disease and other diseases of the circulatory system: Secondary | ICD-10-CM | POA: Diagnosis not present

## 2014-12-18 HISTORY — PX: TOTAL HIP ARTHROPLASTY: SHX124

## 2014-12-18 HISTORY — DX: Osteoarthritis of hip, unspecified: M16.9

## 2014-12-18 LAB — GLUCOSE, CAPILLARY
GLUCOSE-CAPILLARY: 111 mg/dL — AB (ref 65–99)
Glucose-Capillary: 102 mg/dL — ABNORMAL HIGH (ref 65–99)
Glucose-Capillary: 171 mg/dL — ABNORMAL HIGH (ref 65–99)

## 2014-12-18 LAB — PROTIME-INR
INR: 1.13 (ref 0.00–1.49)
PROTHROMBIN TIME: 14.7 s (ref 11.6–15.2)

## 2014-12-18 SURGERY — ARTHROPLASTY, HIP, TOTAL, ANTERIOR APPROACH
Anesthesia: General | Site: Hip | Laterality: Right

## 2014-12-18 MED ORDER — WARFARIN SODIUM 7.5 MG PO TABS
7.5000 mg | ORAL_TABLET | Freq: Once | ORAL | Status: AC
  Administered 2014-12-18: 7.5 mg via ORAL
  Filled 2014-12-18: qty 1

## 2014-12-18 MED ORDER — IPRATROPIUM-ALBUTEROL 0.5-2.5 (3) MG/3ML IN SOLN: 3.0000 mL | RESPIRATORY_TRACT | Status: DC | PRN

## 2014-12-18 MED ORDER — FENTANYL CITRATE (PF) 100 MCG/2ML IJ SOLN
INTRAMUSCULAR | Status: DC | PRN
  Administered 2014-12-18: 100 ug via INTRAVENOUS

## 2014-12-18 MED ORDER — ROCURONIUM BROMIDE 100 MG/10ML IV SOLN
INTRAVENOUS | Status: DC | PRN
  Administered 2014-12-18: 50 mg via INTRAVENOUS
  Administered 2014-12-18: 20 mg via INTRAVENOUS

## 2014-12-18 MED ORDER — METOPROLOL SUCCINATE ER 50 MG PO TB24
50.0000 mg | ORAL_TABLET | Freq: Two times a day (BID) | ORAL | Status: DC
  Administered 2014-12-18 – 2014-12-20 (×4): 50 mg via ORAL
  Filled 2014-12-18 (×4): qty 1

## 2014-12-18 MED ORDER — DEXAMETHASONE SODIUM PHOSPHATE 10 MG/ML IJ SOLN
INTRAMUSCULAR | Status: AC
  Filled 2014-12-18: qty 1

## 2014-12-18 MED ORDER — METOCLOPRAMIDE HCL 5 MG PO TABS
5.0000 mg | ORAL_TABLET | Freq: Three times a day (TID) | ORAL | Status: DC | PRN
  Filled 2014-12-18: qty 2

## 2014-12-18 MED ORDER — FENTANYL CITRATE (PF) 100 MCG/2ML IJ SOLN
INTRAMUSCULAR | Status: AC
  Filled 2014-12-18: qty 2

## 2014-12-18 MED ORDER — PHENOL 1.4 % MT LIQD
1.0000 | OROMUCOSAL | Status: DC | PRN
Start: 2014-12-18 — End: 2014-12-20
  Filled 2014-12-18: qty 177

## 2014-12-18 MED ORDER — LIDOCAINE HCL (CARDIAC) 20 MG/ML IV SOLN
INTRAVENOUS | Status: AC
Start: 2014-12-18 — End: 2014-12-18
  Filled 2014-12-18: qty 5

## 2014-12-18 MED ORDER — ONDANSETRON HCL 4 MG/2ML IJ SOLN
INTRAMUSCULAR | Status: AC
  Filled 2014-12-18: qty 2

## 2014-12-18 MED ORDER — SODIUM CHLORIDE 0.9 % IV SOLN
2000.0000 mg | Freq: Once | INTRAVENOUS | Status: DC
  Filled 2014-12-18: qty 20

## 2014-12-18 MED ORDER — ACETAMINOPHEN 10 MG/ML IV SOLN
INTRAVENOUS | Status: DC | PRN
  Administered 2014-12-18: 1000 mg via INTRAVENOUS

## 2014-12-18 MED ORDER — VANCOMYCIN HCL IN DEXTROSE 1-5 GM/200ML-% IV SOLN
1000.0000 mg | Freq: Two times a day (BID) | INTRAVENOUS | Status: AC
  Administered 2014-12-19: 1000 mg via INTRAVENOUS
  Filled 2014-12-18: qty 200

## 2014-12-18 MED ORDER — ACETAMINOPHEN 10 MG/ML IV SOLN
INTRAVENOUS | Status: AC
  Filled 2014-12-18: qty 100

## 2014-12-18 MED ORDER — ENOXAPARIN SODIUM 30 MG/0.3ML ~~LOC~~ SOLN
30.0000 mg | Freq: Two times a day (BID) | SUBCUTANEOUS | Status: DC
  Administered 2014-12-19 (×2): 30 mg via SUBCUTANEOUS
  Filled 2014-12-18 (×2): qty 0.3

## 2014-12-18 MED ORDER — NEOSTIGMINE METHYLSULFATE 10 MG/10ML IV SOLN
INTRAVENOUS | Status: DC | PRN
  Administered 2014-12-18: 3 mg via INTRAVENOUS

## 2014-12-18 MED ORDER — INSULIN ASPART 100 UNIT/ML ~~LOC~~ SOLN
0.0000 [IU] | Freq: Three times a day (TID) | SUBCUTANEOUS | Status: DC
  Administered 2014-12-19: 5 [IU] via SUBCUTANEOUS
  Administered 2014-12-19: 3 [IU] via SUBCUTANEOUS
  Administered 2014-12-19 – 2014-12-20 (×2): 2 [IU] via SUBCUTANEOUS

## 2014-12-18 MED ORDER — PROPOFOL 10 MG/ML IV BOLUS
INTRAVENOUS | Status: AC
  Filled 2014-12-18: qty 40

## 2014-12-18 MED ORDER — SODIUM CHLORIDE 0.9 % IJ SOLN
INTRAMUSCULAR | Status: AC
  Filled 2014-12-18: qty 10

## 2014-12-18 MED ORDER — WARFARIN - PHARMACIST DOSING INPATIENT: Freq: Every day | Status: DC

## 2014-12-18 MED ORDER — LIDOCAINE HCL (CARDIAC) 20 MG/ML IV SOLN
INTRAVENOUS | Status: DC | PRN
  Administered 2014-12-18: 25 mg via INTRATRACHEAL
  Administered 2014-12-18: 75 mg via INTRAVENOUS

## 2014-12-18 MED ORDER — PANTOPRAZOLE SODIUM 40 MG PO TBEC
40.0000 mg | DELAYED_RELEASE_TABLET | Freq: Every day | ORAL | Status: DC
  Administered 2014-12-19 – 2014-12-20 (×2): 40 mg via ORAL
  Filled 2014-12-18 (×2): qty 1

## 2014-12-18 MED ORDER — MIDAZOLAM HCL 5 MG/5ML IJ SOLN
INTRAMUSCULAR | Status: DC | PRN
  Administered 2014-12-18 (×2): 1 mg via INTRAVENOUS

## 2014-12-18 MED ORDER — BUPIVACAINE HCL (PF) 0.25 % IJ SOLN
INTRAMUSCULAR | Status: AC
  Filled 2014-12-18: qty 30

## 2014-12-18 MED ORDER — ACETAMINOPHEN 10 MG/ML IV SOLN: 1000.0000 mg | Freq: Once | INTRAVENOUS | Status: DC

## 2014-12-18 MED ORDER — MORPHINE SULFATE (PF) 2 MG/ML IV SOLN
1.0000 mg | INTRAVENOUS | Status: DC | PRN
  Administered 2014-12-19 (×4): 1 mg via INTRAVENOUS
  Filled 2014-12-18 (×4): qty 1

## 2014-12-18 MED ORDER — SPIRONOLACTONE 25 MG PO TABS
25.0000 mg | ORAL_TABLET | Freq: Every day | ORAL | Status: DC
  Administered 2014-12-19 – 2014-12-20 (×2): 25 mg via ORAL
  Filled 2014-12-18 (×2): qty 1

## 2014-12-18 MED ORDER — HYDROMORPHONE HCL 1 MG/ML IJ SOLN
INTRAMUSCULAR | Status: AC
  Filled 2014-12-18: qty 1

## 2014-12-18 MED ORDER — FLEET ENEMA 7-19 GM/118ML RE ENEM: 1.0000 | ENEMA | Freq: Once | RECTAL | Status: DC | PRN

## 2014-12-18 MED ORDER — METHOCARBAMOL 500 MG PO TABS
500.0000 mg | ORAL_TABLET | Freq: Four times a day (QID) | ORAL | Status: DC | PRN
Start: 2014-12-18 — End: 2014-12-20
  Filled 2014-12-18: qty 1

## 2014-12-18 MED ORDER — MIDAZOLAM HCL 2 MG/2ML IJ SOLN
INTRAMUSCULAR | Status: AC
  Filled 2014-12-18: qty 2

## 2014-12-18 MED ORDER — LACTATED RINGERS IV SOLN: INTRAVENOUS | Status: DC

## 2014-12-18 MED ORDER — FENTANYL 25 MCG/HR TD PT72
25.0000 ug | MEDICATED_PATCH | TRANSDERMAL | Status: DC
  Administered 2014-12-18: 25 ug via TRANSDERMAL
  Filled 2014-12-18: qty 1

## 2014-12-18 MED ORDER — BUPIVACAINE HCL (PF) 0.25 % IJ SOLN
INTRAMUSCULAR | Status: DC | PRN
  Administered 2014-12-18: 30 mL via INTRA_ARTICULAR

## 2014-12-18 MED ORDER — DEXAMETHASONE SODIUM PHOSPHATE 10 MG/ML IJ SOLN
10.0000 mg | Freq: Once | INTRAMUSCULAR | Status: AC
  Administered 2014-12-19: 10 mg via INTRAVENOUS
  Filled 2014-12-18: qty 1

## 2014-12-18 MED ORDER — ACETAMINOPHEN 500 MG PO TABS
1000.0000 mg | ORAL_TABLET | Freq: Four times a day (QID) | ORAL | Status: AC
  Administered 2014-12-18 – 2014-12-19 (×3): 1000 mg via ORAL
  Filled 2014-12-18 (×4): qty 2

## 2014-12-18 MED ORDER — LACTATED RINGERS IV SOLN
INTRAVENOUS | Status: DC | PRN
  Administered 2014-12-18 (×3): via INTRAVENOUS

## 2014-12-18 MED ORDER — NITROGLYCERIN 0.4 MG SL SUBL: 0.4000 mg | SUBLINGUAL_TABLET | SUBLINGUAL | Status: DC | PRN

## 2014-12-18 MED ORDER — CLONAZEPAM 1 MG PO TABS
1.0000 mg | ORAL_TABLET | Freq: Every day | ORAL | Status: DC
  Administered 2014-12-18 – 2014-12-19 (×2): 1 mg via ORAL
  Filled 2014-12-18 (×2): qty 1

## 2014-12-18 MED ORDER — PROPOFOL 10 MG/ML IV BOLUS
INTRAVENOUS | Status: DC | PRN
  Administered 2014-12-18: 200 mg via INTRAVENOUS

## 2014-12-18 MED ORDER — PHENYLEPHRINE HCL 10 MG/ML IJ SOLN
INTRAMUSCULAR | Status: DC | PRN
  Administered 2014-12-18 (×2): 120 ug via INTRAVENOUS
  Administered 2014-12-18: 80 ug via INTRAVENOUS

## 2014-12-18 MED ORDER — ATORVASTATIN CALCIUM 40 MG PO TABS
40.0000 mg | ORAL_TABLET | Freq: Every day | ORAL | Status: DC
  Administered 2014-12-18 – 2014-12-19 (×2): 40 mg via ORAL
  Filled 2014-12-18 (×2): qty 1

## 2014-12-18 MED ORDER — BISACODYL 10 MG RE SUPP: 10.0000 mg | Freq: Every day | RECTAL | Status: DC | PRN

## 2014-12-18 MED ORDER — NEOSTIGMINE METHYLSULFATE 10 MG/10ML IV SOLN
INTRAVENOUS | Status: AC
  Filled 2014-12-18: qty 1

## 2014-12-18 MED ORDER — DIPHENHYDRAMINE HCL 12.5 MG/5ML PO ELIX: 12.5000 mg | ORAL_SOLUTION | ORAL | Status: DC | PRN

## 2014-12-18 MED ORDER — SUFENTANIL CITRATE 50 MCG/ML IV SOLN
INTRAVENOUS | Status: DC | PRN
  Administered 2014-12-18 (×2): 10 ug via INTRAVENOUS
  Administered 2014-12-18: 5 ug via INTRAVENOUS
  Administered 2014-12-18 (×2): 10 ug via INTRAVENOUS
  Administered 2014-12-18: 5 ug via INTRAVENOUS

## 2014-12-18 MED ORDER — METFORMIN HCL ER 500 MG PO TB24
1000.0000 mg | ORAL_TABLET | Freq: Two times a day (BID) | ORAL | Status: DC
  Administered 2014-12-19 (×2): 1000 mg via ORAL
  Filled 2014-12-18 (×5): qty 2

## 2014-12-18 MED ORDER — ONDANSETRON HCL 4 MG/2ML IJ SOLN
INTRAMUSCULAR | Status: DC | PRN
  Administered 2014-12-18: 4 mg via INTRAVENOUS

## 2014-12-18 MED ORDER — MENTHOL 3 MG MT LOZG
1.0000 | LOZENGE | OROMUCOSAL | Status: DC | PRN
  Filled 2014-12-18: qty 9

## 2014-12-18 MED ORDER — SODIUM CHLORIDE 0.9 % IV SOLN
INTRAVENOUS | Status: DC
  Administered 2014-12-18: 13:00:00 via INTRAVENOUS

## 2014-12-18 MED ORDER — GLYCOPYRROLATE 0.2 MG/ML IJ SOLN
INTRAMUSCULAR | Status: DC | PRN
  Administered 2014-12-18: 0.4 mg via INTRAVENOUS

## 2014-12-18 MED ORDER — ACETAMINOPHEN 325 MG PO TABS: 650.0000 mg | ORAL_TABLET | Freq: Four times a day (QID) | ORAL | Status: DC | PRN

## 2014-12-18 MED ORDER — LOSARTAN POTASSIUM 50 MG PO TABS
100.0000 mg | ORAL_TABLET | Freq: Every day | ORAL | Status: DC
  Administered 2014-12-19 – 2014-12-20 (×2): 100 mg via ORAL
  Filled 2014-12-18 (×2): qty 2

## 2014-12-18 MED ORDER — CLONIDINE HCL 0.1 MG PO TABS
0.2000 mg | ORAL_TABLET | Freq: Every morning | ORAL | Status: DC
  Administered 2014-12-19 – 2014-12-20 (×2): 0.2 mg via ORAL
  Filled 2014-12-18 (×2): qty 2

## 2014-12-18 MED ORDER — TAMSULOSIN HCL 0.4 MG PO CAPS
0.4000 mg | ORAL_CAPSULE | Freq: Every day | ORAL | Status: DC
  Administered 2014-12-18 – 2014-12-20 (×3): 0.4 mg via ORAL
  Filled 2014-12-18 (×3): qty 1

## 2014-12-18 MED ORDER — FUROSEMIDE 20 MG PO TABS
20.0000 mg | ORAL_TABLET | Freq: Every day | ORAL | Status: DC | PRN
  Filled 2014-12-18: qty 1

## 2014-12-18 MED ORDER — 0.9 % SODIUM CHLORIDE (POUR BTL) OPTIME
TOPICAL | Status: DC | PRN
  Administered 2014-12-18: 1000 mL

## 2014-12-18 MED ORDER — POLYETHYLENE GLYCOL 3350 17 G PO PACK: 17.0000 g | PACK | Freq: Every day | ORAL | Status: DC | PRN

## 2014-12-18 MED ORDER — CHLORHEXIDINE GLUCONATE 4 % EX LIQD: 60.0000 mL | Freq: Once | CUTANEOUS | Status: DC

## 2014-12-18 MED ORDER — AMLODIPINE BESYLATE 5 MG PO TABS
5.0000 mg | ORAL_TABLET | Freq: Every day | ORAL | Status: DC
  Administered 2014-12-19 – 2014-12-20 (×2): 5 mg via ORAL
  Filled 2014-12-18 (×2): qty 1

## 2014-12-18 MED ORDER — CLONIDINE HCL 0.3 MG PO TABS
0.4000 mg | ORAL_TABLET | Freq: Two times a day (BID) | ORAL | Status: DC
  Administered 2014-12-18 – 2014-12-19 (×3): 0.4 mg via ORAL
  Filled 2014-12-18 (×3): qty 1

## 2014-12-18 MED ORDER — POTASSIUM CHLORIDE IN NACL 20-0.9 MEQ/L-% IV SOLN
INTRAVENOUS | Status: DC
  Administered 2014-12-18 – 2014-12-19 (×3): via INTRAVENOUS
  Filled 2014-12-18 (×6): qty 1000

## 2014-12-18 MED ORDER — TRAMADOL HCL 50 MG PO TABS: 50.0000 mg | ORAL_TABLET | Freq: Four times a day (QID) | ORAL | Status: DC | PRN

## 2014-12-18 MED ORDER — METOCLOPRAMIDE HCL 5 MG/ML IJ SOLN
5.0000 mg | Freq: Three times a day (TID) | INTRAMUSCULAR | Status: DC | PRN
  Administered 2014-12-19: 10 mg via INTRAVENOUS
  Filled 2014-12-18: qty 2

## 2014-12-18 MED ORDER — DOCUSATE SODIUM 100 MG PO CAPS
100.0000 mg | ORAL_CAPSULE | Freq: Two times a day (BID) | ORAL | Status: DC
  Administered 2014-12-18 – 2014-12-20 (×4): 100 mg via ORAL
  Filled 2014-12-18 (×4): qty 1

## 2014-12-18 MED ORDER — HYDROMORPHONE HCL 1 MG/ML IJ SOLN
INTRAMUSCULAR | Status: DC | PRN
  Administered 2014-12-18 (×2): 1 mg via INTRAVENOUS

## 2014-12-18 MED ORDER — GLYCOPYRROLATE 0.2 MG/ML IJ SOLN
INTRAMUSCULAR | Status: AC
  Filled 2014-12-18: qty 2

## 2014-12-18 MED ORDER — METHOCARBAMOL 1000 MG/10ML IJ SOLN
500.0000 mg | Freq: Four times a day (QID) | INTRAMUSCULAR | Status: DC | PRN
  Administered 2014-12-18 – 2014-12-19 (×2): 500 mg via INTRAVENOUS
  Filled 2014-12-18 (×4): qty 5

## 2014-12-18 MED ORDER — ACETAMINOPHEN 650 MG RE SUPP: 650.0000 mg | Freq: Four times a day (QID) | RECTAL | Status: DC | PRN

## 2014-12-18 MED ORDER — TRANEXAMIC ACID 1000 MG/10ML IV SOLN
2000.0000 mg | INTRAVENOUS | Status: DC | PRN
  Administered 2014-12-18: 2000 mg via TOPICAL

## 2014-12-18 MED ORDER — ONDANSETRON HCL 4 MG PO TABS: 4.0000 mg | ORAL_TABLET | Freq: Four times a day (QID) | ORAL | Status: DC | PRN

## 2014-12-18 MED ORDER — HYDROMORPHONE HCL 1 MG/ML IJ SOLN
0.2500 mg | INTRAMUSCULAR | Status: DC | PRN
  Administered 2014-12-18 (×4): 0.5 mg via INTRAVENOUS

## 2014-12-18 MED ORDER — OXYCODONE HCL 5 MG PO TABS
5.0000 mg | ORAL_TABLET | ORAL | Status: DC | PRN
  Administered 2014-12-18 – 2014-12-20 (×7): 10 mg via ORAL
  Filled 2014-12-18 (×7): qty 2

## 2014-12-18 MED ORDER — ONDANSETRON HCL 4 MG/2ML IJ SOLN
4.0000 mg | Freq: Four times a day (QID) | INTRAMUSCULAR | Status: DC | PRN
  Administered 2014-12-19: 4 mg via INTRAVENOUS
  Filled 2014-12-18: qty 2

## 2014-12-18 MED ORDER — DEXAMETHASONE SODIUM PHOSPHATE 10 MG/ML IJ SOLN
10.0000 mg | Freq: Once | INTRAMUSCULAR | Status: AC
  Administered 2014-12-18: 10 mg via INTRAVENOUS

## 2014-12-18 MED ORDER — HYDROMORPHONE HCL 2 MG/ML IJ SOLN
INTRAMUSCULAR | Status: AC
  Filled 2014-12-18: qty 1

## 2014-12-18 MED ORDER — SUFENTANIL CITRATE 50 MCG/ML IV SOLN
INTRAVENOUS | Status: AC
  Filled 2014-12-18: qty 1

## 2014-12-18 SURGICAL SUPPLY — 35 items
BAG DECANTER FOR FLEXI CONT (MISCELLANEOUS) ×3 IMPLANT
BAG SPEC THK2 15X12 ZIP CLS (MISCELLANEOUS)
BAG ZIPLOCK 12X15 (MISCELLANEOUS) IMPLANT
BLADE SAG 18X100X1.27 (BLADE) ×3 IMPLANT
CAPT HIP TOTAL 2 ×2 IMPLANT
CLOSURE WOUND 1/2 X4 (GAUZE/BANDAGES/DRESSINGS) ×1
CLOTH BEACON ORANGE TIMEOUT ST (SAFETY) ×3 IMPLANT
COVER PERINEAL POST (MISCELLANEOUS) ×3 IMPLANT
DECANTER SPIKE VIAL GLASS SM (MISCELLANEOUS) ×1 IMPLANT
DRAPE STERI IOBAN 125X83 (DRAPES) ×3 IMPLANT
DRAPE U-SHAPE 47X51 STRL (DRAPES) ×6 IMPLANT
DRSG ADAPTIC 3X8 NADH LF (GAUZE/BANDAGES/DRESSINGS) ×3 IMPLANT
DRSG MEPILEX BORDER 4X4 (GAUZE/BANDAGES/DRESSINGS) ×3 IMPLANT
DRSG MEPILEX BORDER 4X8 (GAUZE/BANDAGES/DRESSINGS) ×3 IMPLANT
DURAPREP 26ML APPLICATOR (WOUND CARE) ×3 IMPLANT
ELECT REM PT RETURN 9FT ADLT (ELECTROSURGICAL) ×3
ELECTRODE REM PT RTRN 9FT ADLT (ELECTROSURGICAL) ×1 IMPLANT
EVACUATOR 1/8 PVC DRAIN (DRAIN) ×3 IMPLANT
GLOVE BIO SURGEON STRL SZ7.5 (GLOVE) ×3 IMPLANT
GLOVE BIO SURGEON STRL SZ8 (GLOVE) ×4 IMPLANT
GLOVE BIOGEL PI IND STRL 8 (GLOVE) ×2 IMPLANT
GLOVE BIOGEL PI INDICATOR 8 (GLOVE) ×4
GOWN STRL REUS W/TWL LRG LVL3 (GOWN DISPOSABLE) ×3 IMPLANT
GOWN STRL REUS W/TWL XL LVL3 (GOWN DISPOSABLE) ×3 IMPLANT
PACK ANTERIOR HIP CUSTOM (KITS) ×3 IMPLANT
STRIP CLOSURE SKIN 1/2X4 (GAUZE/BANDAGES/DRESSINGS) ×2 IMPLANT
SUT ETHIBOND NAB CT1 #1 30IN (SUTURE) ×3 IMPLANT
SUT MNCRL AB 4-0 PS2 18 (SUTURE) ×3 IMPLANT
SUT VIC AB 2-0 CT1 27 (SUTURE) ×6
SUT VIC AB 2-0 CT1 TAPERPNT 27 (SUTURE) ×2 IMPLANT
SUT VLOC 180 0 24IN GS25 (SUTURE) ×3 IMPLANT
SYR 50ML LL SCALE MARK (SYRINGE) ×2 IMPLANT
TRAY FOLEY W/METER SILVER 14FR (SET/KITS/TRAYS/PACK) ×1 IMPLANT
TRAY FOLEY W/METER SILVER 16FR (SET/KITS/TRAYS/PACK) ×3 IMPLANT
YANKAUER SUCT BULB TIP 10FT TU (MISCELLANEOUS) ×3 IMPLANT

## 2014-12-18 NOTE — Transfer of Care (Signed)
Immediate Anesthesia Transfer of Care Note  Patient: Steve Anthony  Procedure(s) Performed: Procedure(s): RIGHT TOTAL HIP ARTHROPLASTY ANTERIOR APPROACH (Right)  Patient Location: PACU  Anesthesia Type:General  Level of Consciousness: awake, alert , oriented and patient cooperative  Airway & Oxygen Therapy: Patient Spontanous Breathing and Patient connected to face mask oxygen  Post-op Assessment: Report given to RN, Post -op Vital signs reviewed and stable and Patient moving all extremities X 4  Post vital signs: stable  Last Vitals:  Filed Vitals:   12/18/14 1221  BP: 171/108  Pulse: 71  Temp: 36.6 C  Resp: 18    Complications: No apparent anesthesia complications

## 2014-12-18 NOTE — Op Note (Signed)
OPERATIVE REPORT  PREOPERATIVE DIAGNOSIS: Osteoarthritis of the Right hip.   POSTOPERATIVE DIAGNOSIS: Osteoarthritis of the Right  hip.   PROCEDURE: Right total hip arthroplasty, anterior approach.   SURGEON: Gaynelle Arabian, MD   ASSISTANT: Arlee Muslim, PA-C  ANESTHESIA:  Spinal  ESTIMATED BLOOD LOSS:-700 ml    DRAINS: Hemovac x1.   COMPLICATIONS: None   CONDITION: PACU - hemodynamically stable.   BRIEF CLINICAL NOTE: Steve Anthony is a 64 y.o. male who has advanced end-  stage arthritis of his Right  hip with progressively worsening pain and  dysfunction.The patient has failed nonoperative management and presents for  total hip arthroplasty.   PROCEDURE IN DETAIL: After successful administration of spinal  anesthetic, the traction boots for the [facility] bed were placed on both  feet and the patient was placed onto the [facility] bed, boots placed into the leg  holders. The Right hip was then isolated from the perineum with plastic  drapes and prepped and draped in the usual sterile fashion. ASIS and  greater trochanter were marked and a oblique incision was made, starting  at about 1 cm lateral and 2 cm distal to the ASIS and coursing towards  the anterior cortex of the femur. The skin was cut with a 10 blade  through subcutaneous tissue to the level of the fascia overlying the  tensor fascia lata muscle. The fascia was then incised in line with the  incision at the junction of the anterior third and posterior 2/3rd. The  muscle was teased off the fascia and then the interval between the TFL  and the rectus was developed. The Hohmann retractor was then placed at  the top of the femoral neck over the capsule. The vessels overlying the  capsule were cauterized and the fat on top of the capsule was removed.  A Hohmann retractor was then placed anterior underneath the rectus  femoris to give exposure to the entire anterior capsule. A T-shaped  capsulotomy was performed.  The edges were tagged and the femoral head  was identified.       Osteophytes are removed off the superior acetabulum.  The femoral neck was then cut in situ with an oscillating saw. Traction  was then applied to the left lower extremity utilizing the [facility]  traction. The femoral head was then removed. Retractors were placed  around the acetabulum and then circumferential removal of the labrum was  performed. Osteophytes were also removed. Reaming starts at 49 mm to  medialize and  Increased in 2 mm increments to 55 mm. We reamed in  approximately 40 degrees of abduction, 20 degrees anteversion. A 56 mm  pinnacle acetabular shell was then impacted in anatomic position under  fluoroscopic guidance with excellent purchase. We did not need to place  any additional dome screws. A 36 mm neutral + 4 marathon liner was then  placed into the acetabular shell.       The femoral lift was then placed along the lateral aspect of the femur  just distal to the vastus ridge. The leg was  externally rotated and capsule  was stripped off the inferior aspect of the femoral neck down to the  level of the lesser trochanter, this was done with electrocautery. The femur was lifted after this was performed. The  leg was then placed and extended in adducted position to essentially delivering the femur. We also removed the capsule superiorly and the  piriformis from the  piriformis fossa to gain excellent exposure of the  proximal femur. Rongeur was used to remove some cancellous bone to get  into the lateral portion of the proximal femur for placement of the  initial starter reamer. The starter broaches was placed  the starter broach  and was shown to go down the center of the canal. Broaching  with the  Corail system was then performed starting at size 8, coursing  Up to size 13. A size 13 had excellent torsional and rotational  and axial stability. The trial standard offset neck was then placed  with a 36 + 1  trial head. The hip was then reduced. We confirmed that  the stem was in the canal both on AP and lateral x-rays. It also has excellent sizing. The hip was reduced with outstanding stability through full extension, full external rotation,  and then flexion in adduction internal rotation. AP pelvis was taken  and the leg lengths were measured and found to be exactly equal. Hip  was then dislocated again and the femoral head and neck removed. The  femoral broach was removed. Size 13 Corail stem with a standard offset  neck was then impacted into the femur following native anteversion. Has  excellent purchase in the canal. Excellent torsional and rotational and  axial stability. It is confirmed to be in the canal on AP and lateral  fluoroscopic views. The 36 + 1 ceramic head was placed and the hip  reduced with outstanding stability. Again AP pelvis was taken and it  confirmed that the leg lengths were equal. The wound was then copiously  irrigated with saline solution and the capsule reattached and repaired  with Ethibond suture. 30 ml of .25% Bupivicaine injected into the capsule and into the edge of the tensor fascia lata as well as subcutaneous tissue. The fascia overlying the tensor fascia lata was  then closed with a running #1 V-Loc. Subcu was closed with interrupted  2-0 Vicryl and subcuticular running 4-0 Monocryl. Incision was cleaned  and dried. Steri-Strips and a bulky sterile dressing applied. Hemovac  drain was hooked to suction and then he was awakened and transported to  recovery in stable condition.        Please note that a surgical assistant was a medical necessity for this procedure to perform it in a safe and expeditious manner. Assistant was necessary to provide appropriate retraction of vital neurovascular structures and to prevent femoral fracture and allow for anatomic placement of the prosthesis.  Gaynelle Arabian, M.D.

## 2014-12-18 NOTE — Interval H&P Note (Signed)
History and Physical Interval Note:  12/18/2014 2:39 PM  Steve Anthony  has presented today for surgery, with the diagnosis of osteoarthritis of right hip  The various methods of treatment have been discussed with the patient and family. After consideration of risks, benefits and other options for treatment, the patient has consented to  Procedure(s): RIGHT TOTAL HIP ARTHROPLASTY ANTERIOR APPROACH (Right) as a surgical intervention .  The patient's history has been reviewed, patient examined, no change in status, stable for surgery.  I have reviewed the patient's chart and labs.  Questions were answered to the patient's satisfaction.     Gearlean Alf

## 2014-12-18 NOTE — Anesthesia Procedure Notes (Signed)
Procedure Name: Intubation Date/Time: 12/18/2014 4:03 PM Performed by: Lissa Morales Pre-anesthesia Checklist: Patient identified, Emergency Drugs available, Suction available and Patient being monitored Patient Re-evaluated:Patient Re-evaluated prior to inductionOxygen Delivery Method: Circle System Utilized Preoxygenation: Pre-oxygenation with 100% oxygen Intubation Type: IV induction Ventilation: Mask ventilation without difficulty Laryngoscope Size: Mac and 4 Grade View: Grade II Tube type: Oral Tube size: 8.0 mm Number of attempts: 1 Airway Equipment and Method: Stylet and Oral airway Placement Confirmation: ETT inserted through vocal cords under direct vision,  positive ETCO2 and breath sounds checked- equal and bilateral Secured at: 21 cm Tube secured with: Tape Dental Injury: Teeth and Oropharynx as per pre-operative assessment

## 2014-12-18 NOTE — Anesthesia Preprocedure Evaluation (Signed)
Anesthesia Evaluation  Patient identified by MRN, date of birth, ID band Patient awake    Reviewed: Allergy & Precautions, H&P , NPO status , Patient's Chart, lab work & pertinent test results, reviewed documented beta blocker date and time   Airway Mallampati: II  TM Distance: >3 FB Neck ROM: full    Dental no notable dental hx. (+) Dental Advisory Given   Pulmonary shortness of breath and with exertion, sleep apnea , COPD,  oxygen dependent, former smoker,    Pulmonary exam normal breath sounds clear to auscultation       Cardiovascular Exercise Tolerance: Good hypertension, Pt. on medications and Pt. on home beta blockers + CAD and + Cardiac Stents  negative cardio ROS Normal cardiovascular exam+ dysrhythmias Atrial Fibrillation  Rhythm:regular Rate:Normal  Atrial ablation   Neuro/Psych back fusion negative neurological ROS  negative psych ROS   GI/Hepatic negative GI ROS, Neg liver ROS, GERD  Medicated and Controlled,  Endo/Other  negative endocrine ROSHyperthyroidism   Renal/GU negative Renal ROS  negative genitourinary   Musculoskeletal   Abdominal   Peds  Hematology negative hematology ROS (+)   Anesthesia Other Findings   Reproductive/Obstetrics negative OB ROS                             Anesthesia Physical Anesthesia Plan  ASA: III  Anesthesia Plan: General   Post-op Pain Management:    Induction: Intravenous  Airway Management Planned: Oral ETT  Additional Equipment:   Intra-op Plan:   Post-operative Plan: Extubation in OR  Informed Consent: I have reviewed the patients History and Physical, chart, labs and discussed the procedure including the risks, benefits and alternatives for the proposed anesthesia with the patient or authorized representative who has indicated his/her understanding and acceptance.   Dental Advisory Given  Plan Discussed with: CRNA and  Surgeon  Anesthesia Plan Comments:         Anesthesia Quick Evaluation

## 2014-12-18 NOTE — Anesthesia Postprocedure Evaluation (Signed)
Anesthesia Post Note  Patient: Steve Anthony  Procedure(s) Performed: Procedure(s) (LRB): RIGHT TOTAL HIP ARTHROPLASTY ANTERIOR APPROACH (Right)  Patient location during evaluation: PACU Anesthesia Type: General Level of consciousness: awake and alert Pain management: pain level controlled Vital Signs Assessment: post-procedure vital signs reviewed and stable Respiratory status: spontaneous breathing, nonlabored ventilation, respiratory function stable and patient connected to nasal cannula oxygen Cardiovascular status: blood pressure returned to baseline and stable Postop Assessment: No signs of nausea or vomiting Anesthetic complications: no    Last Vitals:  Filed Vitals:   12/18/14 1815 12/18/14 1832  BP: 147/93 162/93  Pulse: 61 62  Temp: 36.7 C 36.6 C  Resp: 16 16    Last Pain:  Filed Vitals:   12/18/14 1833  PainSc: Asleep    LLE Motor Response: Purposeful movement LLE Sensation: Full sensation RLE Motor Response: Purposeful movement RLE Sensation: Full sensation      Keyleen Cerrato L

## 2014-12-18 NOTE — Progress Notes (Signed)
ANTICOAGULATION CONSULT NOTE - Initial Consult  Pharmacy Consult for Warfarin Indication: chronic Warfarin for Afib, post-op THA  Allergies  Allergen Reactions  . Penicillins Hives and Shortness Of Breath    Has patient had a PCN reaction causing immediate rash, facial/tongue/throat swelling, SOB or lightheadedness with hypotension: Yes Has patient had a PCN reaction causing severe rash involving mucus membranes or skin necrosis: No Has patient had a PCN reaction that required hospitalization: Yes Has patient had a PCN reaction occurring within the last 10 years: No If all of the above answers are "NO", then may proceed with Cephalosporin use.    Patient Measurements: Height: 6' (182.9 cm) Weight: 266 lb (120.657 kg) IBW/kg (Calculated) : 77.6  Vital Signs: Temp: 97.8 F (36.6 C) (11/23 1832) Temp Source: Oral (11/23 1221) BP: 162/93 mmHg (11/23 1832) Pulse Rate: 62 (11/23 1832)  Labs:  Recent Labs  12/18/14 1310  LABPROT 14.7  INR 1.13   Estimated Creatinine Clearance: 103.5 mL/min (by C-G formula based on Cr of 0.98).  Medical History: Past Medical History  Diagnosis Date  . Paroxysmal a-fib ([place])     a. S/P PVI 2/11 and 9/11  . SVT (supraventricular tachycardia) ([place])     a. Atypical AVNRT of the slow pathway  . GERD (gastroesophageal reflux disease)   . Nephrolithiasis   . Dyslipidemia   . Hypertension   . CAD (coronary artery disease)     a. S/P PCI mid LAD (vision stent) 08/31/2004;  b. repeat cath 6/11 - no progression of CAD. c. Cath 10/2012 given moderate CAD on cardiac CT - no change from prior cath.  Marland Kitchen COPD (chronic obstructive pulmonary disease) ([place])   . Hyperthyroidism   . Type II diabetes mellitus ([place])   . ZOXWRUEA(540.9)     "sometimes weekly" (11/08/2012)  . DJD (degenerative joint disease) of lumbar spine   . Arthritis     "in my back and right leg" (11/08/2012)  . Chronic lower back pain   . On home oxygen therapy     "2L q hs"  (11/08/2012)  . Palpitations     a. 10/2012 - s/p LINQ loop recorder to assess for arrhythmia.   Marland Kitchen Hypomagnesemia     a. 10/2012 - Mg 0.7.  . Hypokalemia   . Shortness of breath dyspnea     with exertion   . Cancer ([place])     melanoma removed from neck   . OSA (obstructive sleep apnea)     a. cpap noncompliance- patient reports on 12/10/14- does not use CPAP   Medications:  Scheduled:  . acetaminophen  1,000 mg Oral Q6H  . [START ON 12/19/2014] amLODipine  5 mg Oral Daily  . atorvastatin  40 mg Oral QHS  . clonazePAM  1 mg Oral QHS  . [START ON 12/19/2014] cloNIDine  0.2 mg Oral q morning - 10a  . cloNIDine  0.4 mg Oral BID  . [START ON 12/19/2014] dexamethasone  10 mg Intravenous Once  . docusate sodium  100 mg Oral BID  . [START ON 12/19/2014] enoxaparin (LOVENOX) injection  30 mg Subcutaneous Q12H  . fentaNYL  25 mcg Transdermal Q3 days  . HYDROmorphone      . HYDROmorphone      . [START ON 12/19/2014] insulin aspart  0-15 Units Subcutaneous TID WC  . [START ON 12/19/2014] losartan  100 mg Oral Daily  . [START ON 12/19/2014] metFORMIN  1,000 mg Oral BID WC  . metoprolol succinate  50 mg Oral  BID  . [START ON 12/19/2014] pantoprazole  40 mg Oral Daily  . [START ON 12/19/2014] spironolactone  25 mg Oral Daily  . tamsulosin  0.4 mg Oral Daily  . [START ON 12/19/2014] vancomycin  1,000 mg Intravenous Q12H   Assessment: 74 yoM s/p R anterior THA. On chronic Warfarin for Afib, home dose 7.36m MWF, 536mother days, last dose 11/17. Admit INR 1.13, Lovenox bridge 3016mid to begin 11/24.   Goal of Therapy:  INR 2-3 Monitor platelets by anticoagulation protocol: Yes   Plan:   Lovenox 15m82md  Warfarin 7.5mg 73mtonight  Daily CBC, PT/INR ordered from 11/24  GreenMinda DittomD Pager [phone]/2016, 7:06 PM

## 2014-12-18 NOTE — H&P (View-Only) (Signed)
Steve Anthony DOB: [birth year] Married / Language: English / Race: White Male Date of Admission:  12/18/2014 CC:  Right Hip Pain History of Present Illness The patient is a 64 year old male who comes in for a preoperative History and Physical. The patient is scheduled for a right total hip arthroplasty (anterior) to be performed by Dr. Dione Plover. Aluisio, MD at [facility] on 12-18-2014. The patient is a 64 year old male who presents with a hip problem. The patient was seen for a second opinion.The patient reports right lateral hip, right posterior hip and right anterior hip problems including pain and stiffness symptoms that have been present for 1 year(s). The symptoms began without any known injury. Symptoms reported include hip pain, pain with weightbearing, stiffness, difficulty flexing hip, difficulty rotating hip, difficulty bearing weight and difficulty ambulatingThe symptoms are described as moderate in severity.The patient feels as if their symptoms are does feel they are worsening. Symptoms are exacerbated by flexing hip, weight bearing and walking. Symptoms are relieved by rest and opioid analgesics. Prior to being seen, the patient was previously evaluated by an out of town physician Event organiser and had an injection in his right hip. This helped for 1 day) 3 month(s) ago. He states he is not positive that the pain is coming from his hip and is hard to differentiate the hip pain from back pain. He does have real significant discomfort in his groin radiating down his anterior thigh. Occasionally, it will go all the way down the leg. He also has real significant low back pain. He is not having any left hip pain. This has all gone progressively worse in the past six months. Pain has been present for a year, but it has worsened significantly in six months. It is hurting him at night now. It is limiting what he can and cannot do. The intra-articular injection provided very short term  benefit. He is ready to get the hip fixed. They have been treated conservatively in the past for the above stated problem and despite conservative measures, they continue to have progressive pain and severe functional limitations and dysfunction. They have failed non-operative management including home exercise, medications, and injections. It is felt that they would benefit from undergoing total joint replacement. Risks and benefits of the procedure have been discussed with the patient and they elect to proceed with surgery. There are no active contraindications to surgery such as ongoing infection or rapidly progressive neurological disease.  Problem List/Past Medical  Primary osteoarthritis of right hip (M16.11) Sleep Apnea has a CPAP but does not use at home currently but does have oxygen at night Ulcer disease Kidney Stone Asthma Gastroesophageal Reflux Disease Coronary Artery Disease/Heart Disease High blood pressure Congestive Heart Failure Hypercholesterolemia Diverticulitis Of Colon Chronic Obstructive Lung Disease Diabetes Mellitus, Type II Varicose veins Hemorrhoids Measles Mumps  Allergies Penicillin G Pot in Dextrose *PENICILLINS* Hives, Difficulty breathing. Whelps  Family History  Diabetes Mellitus father Hypertension father Cerebrovascular Accident father  Social History Current work status retired Engineer, agricultural (Currently) no Drug/Alcohol Rehab (Previously) no Children 1 Alcohol use never consumed alcohol Tobacco use never smoker Number of flights of stairs before winded 4-5 Exercise Exercises daily Illicit drug use no Marital status married Clinical research associate Will, Healthcare POA  Medication History Aspirin (81MG Tablet DR, Oral) Active. Furosemide (20MG Tablet, Oral) Active. Pantoprazole Sodium (40MG Tablet DR, Oral) Active. Atorvastatin Calcium (40MG Tablet, Oral) Active. Tamsulosin HCl (0.4MG Capsule,  Oral) Active. Fenofibrate (160MG  Tablet, Oral) Active. Losartan Potassium (50MG Tablet, Oral) Active. MetFORMIN HCl ER (500MG Tablet ER 24HR, Oral) Active. Metoprolol Succinate ER (50MG Tablet ER 24HR, Oral) Active. CloNIDine HCl (0.2MG Tablet, Oral) Active. Warfarin Sodium (5MG Tablet, Oral) Active. Hydrocodone-Acetaminophen (7.5-325MG Tablet, Oral) Active. FentaNYL (25MCG/HR Patch 72HR, Transdermal) Active. Zolpidem Tartrate ER (12.5MG Tablet ER, Oral) Active. B-12 Active. Stool Softner Active. Spironolactone Active. Clonazepam Active. Melatonin Active. NitroStat Active.  Past Surgical History Heart Stents One Spinal Surgery Tonsillectomy Partial Colectomy secondary to Diverticulitis  Review of Systems General Not Present- Chills, Fatigue, Fever, Memory Loss, Night Sweats, Weight Gain and Weight Loss. Skin Not Present- Eczema, Hives, Itching, Lesions and Rash. HEENT Present- Headache. Not Present- Dentures, Double Vision, Hearing Loss, Tinnitus and Visual Loss. Respiratory Not Present- Allergies, Chronic Cough, Coughing up blood, Shortness of breath at rest and Shortness of breath with exertion. Cardiovascular Not Present- Chest Pain, Difficulty Breathing Lying Down, Murmur, Palpitations, Racing/skipping heartbeats and Swelling. Gastrointestinal Not Present- Abdominal Pain, Bloody Stool, Constipation, Diarrhea, Difficulty Swallowing, Heartburn, Jaundice, Loss of appetitie, Nausea and Vomiting. Male Genitourinary Not Present- Blood in Urine, Discharge, Flank Pain, Incontinence, Painful Urination, Urgency, Urinary frequency, Urinary Retention, Urinating at Night and Weak urinary stream. Musculoskeletal Present- Back Pain and Joint Pain. Not Present- Joint Swelling, Morning Stiffness, Muscle Pain, Muscle Weakness and Spasms. Neurological Not Present- Blackout spells, Difficulty with balance, Dizziness, Paralysis, Tremor and Weakness. Psychiatric Not Present-  Insomnia.  Vitals Weight: 257 lb Height: 72in Weight was reported by patient. Height was reported by patient. Body Surface Area: 2.37 m Body Mass Index: 34.86 kg/m  BP: 188/98 (Sitting, Left Arm, Standard)  Physical Exam General Mental Status -Alert, cooperative and good historian. General Appearance-pleasant, Not in acute distress. Orientation-Oriented X3. Build & Nutrition-Well nourished and Well developed.  Head and Neck Head-normocephalic, atraumatic . Neck Global Assessment - supple, no bruit auscultated on the right, no bruit auscultated on the left.  Eye Pupil - Bilateral-Regular and Round. Motion - Bilateral-EOMI.  ENMT Note: upper and lower dentures   Chest and Lung Exam Auscultation Breath sounds - clear at anterior chest wall and clear at posterior chest wall. Adventitious sounds - No Adventitious sounds.  Cardiovascular Auscultation Rhythm - Regular rate and rhythm. Heart Sounds - S1 WNL and S2 WNL. Murmurs & Other Heart Sounds - Auscultation of the heart reveals - No Murmurs.  Abdomen Inspection Contour - Generalized moderate distention. Palpation/Percussion Tenderness - Abdomen is non-tender to palpation. Rigidity (guarding) - Abdomen is soft. Auscultation Auscultation of the abdomen reveals - Bowel sounds normal.  Male Genitourinary Note: Not done, not pertinent to present illness   Musculoskeletal Note: Well developed male, in no distress. Evaluation of his left hip shows normal range of motion with no discomfort. Right hip shows flexion about 100, rotation in 10 and out 20, abduction 20 with discomfort. He is somewhat tender over the greater trochanter. His knee exam is normal bilaterally. Pulses are intact distally with intact motor.  RADIOGRAPHS His radiographs AP pelvis and lateral of the right hip showed he has bone on bone change in the right hip especially superolateral. He also has osteophyte formation. He is not  concentrically bone on bone, but has significant narrowing concentrically with the focal bone on bone changes.  Assessment & Plan Primary osteoarthritis of right hip (M16.11) Note:Surgical Plans: Right Total Hip Replacement - Anterior Approach  Disposition: Home  Cards: Dr. Thompson Grayer - Patient has been seen preoperatively and felt to be stable for surgery. PCP: Dr. [name] -  Patient has been seen preoperatively and felt to be stable for surgery. " Moderate risk for complications. Keep on a monitored bed, Medical Consult, Inpatient Cardiology consultation, Use DVT prophylaxis and resume Warfarion when able. Monitor O2 Sats."  Topical TXA - CAD, Heart Stent  Anesthesia Issues: None  Signed electronically by Joelene Millin, III PA-C

## 2014-12-19 ENCOUNTER — Inpatient Hospital Stay (HOSPITAL_COMMUNITY): Payer: Medicare PPO

## 2014-12-19 DIAGNOSIS — M79661 Pain in right lower leg: Secondary | ICD-10-CM

## 2014-12-19 LAB — CBC
HCT: 43 % (ref 39.0–52.0)
Hemoglobin: 14.3 g/dL (ref 13.0–17.0)
MCH: 27.8 pg (ref 26.0–34.0)
MCHC: 33.3 g/dL (ref 30.0–36.0)
MCV: 83.7 fL (ref 78.0–100.0)
Platelets: 171 10*3/uL (ref 150–400)
RBC: 5.14 MIL/uL (ref 4.22–5.81)
RDW: 14.2 % (ref 11.5–15.5)
WBC: 14.7 10*3/uL — ABNORMAL HIGH (ref 4.0–10.5)

## 2014-12-19 LAB — GLUCOSE, CAPILLARY
GLUCOSE-CAPILLARY: 141 mg/dL — AB (ref 65–99)
GLUCOSE-CAPILLARY: 169 mg/dL — AB (ref 65–99)
Glucose-Capillary: 200 mg/dL — ABNORMAL HIGH (ref 65–99)
Glucose-Capillary: 205 mg/dL — ABNORMAL HIGH (ref 65–99)
Glucose-Capillary: 229 mg/dL — ABNORMAL HIGH (ref 65–99)

## 2014-12-19 LAB — BASIC METABOLIC PANEL
Anion gap: 9 (ref 5–15)
BUN: 10 mg/dL (ref 6–20)
CALCIUM: 8.5 mg/dL — AB (ref 8.9–10.3)
CO2: 25 mmol/L (ref 22–32)
CREATININE: 0.89 mg/dL (ref 0.61–1.24)
Chloride: 103 mmol/L (ref 101–111)
GFR calc non Af Amer: >60 mL/min (ref >60)
Glucose, Bld: 210 mg/dL — ABNORMAL HIGH (ref 65–99)
Potassium: 3.8 mmol/L (ref 3.5–5.1)
SODIUM: 137 mmol/L (ref 135–145)

## 2014-12-19 LAB — PROTIME-INR
INR: 1.14 (ref 0.00–1.49)
PROTHROMBIN TIME: 14.8 s (ref 11.6–15.2)

## 2014-12-19 MED ORDER — WARFARIN SODIUM 5 MG PO TABS
10.0000 mg | ORAL_TABLET | Freq: Once | ORAL | Status: AC
  Administered 2014-12-19: 10 mg via ORAL
  Filled 2014-12-19: qty 2

## 2014-12-19 NOTE — Progress Notes (Signed)
VASCULAR LAB PRELIMINARY  PRELIMINARY  PRELIMINARY  PRELIMINARY  Right lower extremity venous duplex completed.    Preliminary report:  There is no DVT or SVT noted in the right lower extremity.   Vladislav Axelson, RVT 12/19/2014, 1:50 PM

## 2014-12-19 NOTE — Evaluation (Signed)
Physical Therapy Evaluation Patient Details Name: Steve Anthony MRN: [id number] DOB: 02/13/1950 Today's Date: 12/19/2014   History of Present Illness  pt s/p R DATHA , history of spinal surgery  Clinical Impression  Pt with s/p R DATHA presents with great R calf pain and moderate pain in R hip as well limiting mobility at this time. To benefit from continued PT to help with transition home safely with wife.      Follow Up Recommendations Home health PT    Equipment Recommendations  Rolling walker with 5" wheels    Recommendations for Other Services       Precautions / Restrictions Precautions Precautions: None Restrictions Weight Bearing Restrictions: No (WBAT R LE)      Mobility  Bed Mobility Overal bed mobility: Needs Assistance Bed Mobility: Supine to Sit;Sit to Supine     Supine to sit: Mod assist;HOB elevated     General bed mobility comments: increased time due to pain and pt request to raise HOB, adn needed assist wtih R LE as well   Transfers Overall transfer level: Needs assistance Equipment used: Rolling walker (2 wheeled) Transfers: Sit to/from Stand Sit to Stand: Min assist;From elevated surface         General transfer comment: needed boost from bed, also stacked pillow in recliner for addeed height  Ambulation/Gait Ambulation/Gait assistance: Min assist Ambulation Distance (Feet): 5 Feet Assistive device: Rolling walker (2 wheeled) Gait Pattern/deviations: Step-to pattern     General Gait Details: limited with steps for turn transfer due to pain in R calf when attempted weight bearing on Right. Nurse notified of this.   Stairs            Wheelchair Mobility    Modified Rankin (Stroke Patients Only)       Balance                                             Pertinent Vitals/Pain Pain Assessment: 0-10 Pain Score: 8  Pain Location: R hip , increased painwith movment. ALSO most pain in R calf area, especially  with DF and when trying to WB. Noted calf increased size in comparison to L one. Nurse alerted of all the above.  Pain Descriptors / Indicators: Burning;Aching Pain Intervention(s): Monitored during session;Patient requesting pain meds-RN notified;Repositioned    Home Living Family/patient expects to be discharged to:: Private residence   Available Help at Discharge: Family Type of Home: Mobile home Home Access: Ramped entrance     Home Layout: One level        Prior Function Level of Independence: Independent               Hand Dominance        Extremity/Trunk Assessment               Lower Extremity Assessment: RLE deficits/detail RLE Deficits / Details: limited movment possiblity due to pain in R LE and calf        Communication   Communication: No difficulties  Cognition Arousal/Alertness: Awake/alert Behavior During Therapy: WFL for tasks assessed/performed Overall Cognitive Status: Within Functional Limits for tasks assessed                      General Comments      Exercises Total Joint Exercises Ankle Circles/Pumps: AROM Quad Sets: AROM;Supine;Right;5 reps Heel Slides: AAROM;Supine;Right;5  reps Hip ABduction/ADduction: AAROM;Right;5 reps;Supine      Assessment/Plan    PT Assessment Patient needs continued PT services  PT Diagnosis Difficulty walking   PT Problem List Decreased strength;Decreased activity tolerance;Decreased mobility;Decreased knowledge of use of DME  PT Treatment Interventions Gait training;Functional mobility training;Therapeutic activities;Therapeutic exercise;Patient/family education   PT Goals (Current goals can be found in the Care Plan section) Acute Rehab PT Goals Patient Stated Goal: Iw ant to be able to walka dn get home PT Goal Formulation: With patient Time For Goal Achievement: 01/02/15 Potential to Achieve Goals: Good    Frequency BID   Barriers to discharge        Co-evaluation                End of Session Equipment Utilized During Treatment: Gait belt Activity Tolerance: Patient tolerated treatment well Patient left: in chair;with family/visitor present;with call bell/phone within reach;with chair alarm set Nurse Communication: Mobility status (also notified of R calf pain and swelling)         Time: 2575-0518 PT Time Calculation (min) (ACUTE ONLY): 36 min   Charges:   PT Evaluation $Initial PT Evaluation Tier I: 1 Procedure PT Treatments $Therapeutic Activity: 8-22 mins   PT G CodesClide Dales 15-Jan-2015, 10:36 AM Clide Dales, PT Pager: [phone] 15-Jan-2015

## 2014-12-19 NOTE — Progress Notes (Signed)
ANTICOAGULATION CONSULT NOTE - Follow Up  Pharmacy Consult for Warfarin Indication: chronic Warfarin for Afib, post-op THA  Allergies  Allergen Reactions  . Penicillins Hives and Shortness Of Breath    Has patient had a PCN reaction causing immediate rash, facial/tongue/throat swelling, SOB or lightheadedness with hypotension: Yes Has patient had a PCN reaction causing severe rash involving mucus membranes or skin necrosis: No Has patient had a PCN reaction that required hospitalization: Yes Has patient had a PCN reaction occurring within the last 10 years: No If all of the above answers are "NO", then may proceed with Cephalosporin use.    Patient Measurements: Height: 6' (182.9 cm) Weight: 266 lb (120.657 kg) IBW/kg (Calculated) : 77.6  Vital Signs: Temp: 99.2 F (37.3 C) (11/24 0632) Temp Source: Oral (11/24 4982) BP: 167/92 mmHg (11/24 6415) Pulse Rate: 92 (11/24 0632)  Labs:  Recent Labs  12/18/14 1310 12/19/14 0451  HGB  --  14.3  HCT  --  43.0  PLT  --  171  LABPROT 14.7 14.8  INR 1.13 1.14  CREATININE  --  0.89   Estimated Creatinine Clearance: 113.9 mL/min (by C-G formula based on Cr of 0.89).  Medications:  Scheduled:  . acetaminophen  1,000 mg Oral Q6H  . amLODipine  5 mg Oral Daily  . atorvastatin  40 mg Oral QHS  . clonazePAM  1 mg Oral QHS  . cloNIDine  0.2 mg Oral q morning - 10a  . cloNIDine  0.4 mg Oral BID  . dexamethasone  10 mg Intravenous Once  . docusate sodium  100 mg Oral BID  . enoxaparin (LOVENOX) injection  30 mg Subcutaneous Q12H  . fentaNYL  25 mcg Transdermal Q3 days  . insulin aspart  0-15 Units Subcutaneous TID WC  . losartan  100 mg Oral Daily  . metFORMIN  1,000 mg Oral BID WC  . metoprolol succinate  50 mg Oral BID  . pantoprazole  40 mg Oral Daily  . spironolactone  25 mg Oral Daily  . tamsulosin  0.4 mg Oral Daily  . Warfarin - Pharmacist Dosing Inpatient   Does not apply q1800   Assessment: 76 yoM s/p R anterior  THA. On chronic Warfarin for Afib, home dose 7.66m MWF, 550mother days, last dose 11/17. Admit INR 1.13, Lovenox bridge 3037mid to begin 11/24.   Today, 12/19/2014   INR subtherapeutic as expected (1.14) after resuming warfarin last PM  Hgb stable (14.3), plts ok  No bleeding reported  Goal of Therapy:  INR 2-3 Monitor platelets by anticoagulation protocol: Yes   Plan:   Lovenox 59m34md  Warfarin 10mg42mtonight  Daily CBC, PT/INR   Minoru Chap Peggyann JubarmD, BCPS Pager: [phone] 408324/2016, 8:19 AM

## 2014-12-19 NOTE — Progress Notes (Signed)
Physical Therapy Treatment Patient Details Name: Steve Anthony MRN: [id number] DOB: [birth year] Today's Date: 12/19/2014    History of Present Illness pt s/p R DATHA , history of spinal surgery    PT Comments    Progressing, doppler negative; encouraged pt to ask for pain meds in am in  preparation for PT tomorrow;  Follow Up Recommendations  Home health PT     Equipment Recommendations  Rolling walker with 5" wheels    Recommendations for Other Services       Precautions / Restrictions Precautions Precautions: None Precaution Comments: wears O2 at night at home/monitor sats Restrictions Other Position/Activity Restrictions: WBAT    Mobility  Bed Mobility Overal bed mobility: Needs Assistance             General bed mobility comments: in cahir, wants to stay in chair  Transfers Overall transfer level: Needs assistance Equipment used: Rolling walker (2 wheeled) Transfers: Sit to/from Stand Sit to Stand: Min assist         General transfer comment: min for transition to complete standing/hands to RW  Ambulation/Gait Ambulation/Gait assistance: Min guard Ambulation Distance (Feet): 80 Feet Assistive device: Rolling walker (2 wheeled) Gait Pattern/deviations: Step-to pattern;Antalgic;Leaning posteriorly   Gait velocity interpretation: Below normal speed for age/gender General Gait Details: incr time, cues for RW safety and to keep COG over BOS, tends to lean posteriorly at times   Stairs Stairs:  (has ramp at home)          Wheelchair Mobility    Modified Rankin (Stroke Patients Only)       Balance                                    Cognition Arousal/Alertness: Awake/alert Behavior During Therapy: WFL for tasks assessed/performed Overall Cognitive Status: Within Functional Limits for tasks assessed                      Exercises Total Joint Exercises Ankle Circles/Pumps: AROM;Both;10 reps Long Arc Quad:  AROM;Strengthening;Both;10 reps    General Comments        Pertinent Vitals/Pain Pain Assessment: 0-10 Pain Score: 10-Worst pain ever Pain Location: pt states hip pain is a 10 while amb but is willing to continue; was premedicated and is conversant--later states "not too bad" Pain Descriptors / Indicators: Constant Pain Intervention(s): Limited activity within patient's tolerance;Monitored during session    Home Living                      Prior Function            PT Goals (current goals can now be found in the care plan section) Acute Rehab PT Goals Patient Stated Goal: be able to walk PT Goal Formulation: With patient Time For Goal Achievement: 01/02/15 Potential to Achieve Goals: Good Progress towards PT goals: Progressing toward goals    Frequency  7X/week    PT Plan Current plan remains appropriate    Co-evaluation             End of Session Equipment Utilized During Treatment: Gait belt Activity Tolerance: Patient tolerated treatment well Patient left: in chair;with family/visitor present;with call bell/phone within reach;with chair alarm set     Time: 2458-0998 PT Time Calculation (min) (ACUTE ONLY): 35 min  Charges:  $Gait Training: 23-37 mins  G CodesKenyon Ana 12/19/2014, 3:52 PM

## 2014-12-19 NOTE — Progress Notes (Signed)
OT Cancellation Note  Patient Details Name: Steve Anthony MRN: [id number] DOB: 14-May-1950   Cancelled Treatment:    Reason Eval/Treat Not Completed: Other (comment).  Noted, plan is for ultrasound to r/o DVT.  Will check back later today or tomorrow.  Riaan Toledo 12/19/2014, 10:46 AM  Lesle Chris, OTR/L [phone] 12/19/2014

## 2014-12-19 NOTE — Progress Notes (Signed)
Subjective: 1 Day Post-Op Procedure(s) (LRB): RIGHT TOTAL HIP ARTHROPLASTY ANTERIOR APPROACH (Right)  Patient reports pain as moderate.  States that he tried to eat a little last night and was unable to keep it down.  Admits to BM.  Denies fever, chills, N/V.  Reports that he doesn't believe he is up to going home today and is planning to go home tomorrow.  Objective:   VITALS:  Temp:  [97.8 F (36.6 C)-99.2 F (37.3 C)] 99.2 F (37.3 C) (11/24 2423) Pulse Rate:  [59-92] 92 (11/24 0632) Resp:  [14-18] 17 (11/24 0632) BP: (147-171)/(85-108) 167/92 mmHg (11/24 0632) SpO2:  [90 %-100 %] 93 % (11/24 5361) Weight:  [120.657 kg (266 lb)] 120.657 kg (266 lb) (11/23 1232)  General: WDWN patient in NAD. Psych:  Appropriate mood and affect. Neuro:  A&O x 3, Moving all extremities, sensation intact to light touch HEENT:  EOMs intact Chest:  Even non-labored respirations Skin:  Dressing C/D/I.  Drain intact. Extremities: warm/dry, no edema, erythmea, or echymosis.  No lymphadenopathy. Pulses: Popliteus 2+ MSK:  ROM: Full ankle ROM.  Comfortable HF to 45 degrees this am, MMT: patient can perform quad set, (-) Homan's    LABS  Recent Labs  12/19/14 0451  HGB 14.3  WBC 14.7*  PLT 171    Recent Labs  12/19/14 0451  NA 137  K 3.8  CL 103  CO2 25  BUN 10  CREATININE 0.89  GLUCOSE 210*    Recent Labs  12/18/14 1310 12/19/14 0451  INR 1.13 1.14     Assessment/Plan: 1 Day Post-Op Procedure(s) (LRB): RIGHT TOTAL HIP ARTHROPLASTY ANTERIOR APPROACH (Right)  Up with therapy  WBAT RLE Lovenox for DVT prophylaxis Ensure that he is tolerating POs and plan for potential D/C home tomorrow.  Mechele Claude, PA-C, [employer] Office:  [phone]

## 2014-12-20 ENCOUNTER — Encounter (HOSPITAL_COMMUNITY): Payer: Self-pay | Admitting: Orthopedic Surgery

## 2014-12-20 LAB — BASIC METABOLIC PANEL
ANION GAP: 11 (ref 5–15)
BUN: 14 mg/dL (ref 6–20)
CALCIUM: 8.2 mg/dL — AB (ref 8.9–10.3)
CO2: 25 mmol/L (ref 22–32)
Chloride: 103 mmol/L (ref 101–111)
Creatinine, Ser: 0.82 mg/dL (ref 0.61–1.24)
GFR calc Af Amer: >60 mL/min (ref >60)
GFR calc non Af Amer: >60 mL/min (ref >60)
GLUCOSE: 168 mg/dL — AB (ref 65–99)
Potassium: 3.9 mmol/L (ref 3.5–5.1)
Sodium: 139 mmol/L (ref 135–145)

## 2014-12-20 LAB — PROTIME-INR
INR: 1.24 (ref 0.00–1.49)
Prothrombin Time: 15.8 seconds — ABNORMAL HIGH (ref 11.6–15.2)

## 2014-12-20 LAB — CBC
HEMATOCRIT: 36 % — AB (ref 39.0–52.0)
Hemoglobin: 11.8 g/dL — ABNORMAL LOW (ref 13.0–17.0)
MCH: 28.2 pg (ref 26.0–34.0)
MCHC: 32.8 g/dL (ref 30.0–36.0)
MCV: 86.1 fL (ref 78.0–100.0)
Platelets: 149 10*3/uL — ABNORMAL LOW (ref 150–400)
RBC: 4.18 MIL/uL — ABNORMAL LOW (ref 4.22–5.81)
RDW: 14.6 % (ref 11.5–15.5)
WBC: 8 10*3/uL (ref 4.0–10.5)

## 2014-12-20 LAB — GLUCOSE, CAPILLARY
GLUCOSE-CAPILLARY: 142 mg/dL — AB (ref 65–99)
Glucose-Capillary: 104 mg/dL — ABNORMAL HIGH (ref 65–99)

## 2014-12-20 MED ORDER — TRAMADOL HCL 50 MG PO TABS: 50.0000 mg | ORAL_TABLET | Freq: Four times a day (QID) | ORAL | Status: DC | PRN

## 2014-12-20 MED ORDER — ENOXAPARIN SODIUM 40 MG/0.4ML ~~LOC~~ SOLN: 40.0000 mg | SUBCUTANEOUS | Status: DC

## 2014-12-20 MED ORDER — WARFARIN SODIUM 5 MG PO TABS: 5.0000 mg | ORAL_TABLET | Freq: Every day | ORAL | Status: DC

## 2014-12-20 MED ORDER — WARFARIN SODIUM 5 MG PO TABS: 7.5000 mg | ORAL_TABLET | Freq: Once | ORAL | Status: DC

## 2014-12-20 MED ORDER — ENOXAPARIN SODIUM 30 MG/0.3ML ~~LOC~~ SOLN: 30.0000 mg | Freq: Two times a day (BID) | SUBCUTANEOUS | Status: DC

## 2014-12-20 MED ORDER — OXYCODONE HCL 5 MG PO TABS: 5.0000 mg | ORAL_TABLET | ORAL | Status: DC | PRN

## 2014-12-20 MED ORDER — METHOCARBAMOL 500 MG PO TABS: 500.0000 mg | ORAL_TABLET | Freq: Four times a day (QID) | ORAL | Status: DC | PRN

## 2014-12-20 MED ORDER — ENOXAPARIN SODIUM 40 MG/0.4ML ~~LOC~~ SOLN
40.0000 mg | SUBCUTANEOUS | Status: DC
  Filled 2014-12-20: qty 0.4

## 2014-12-20 NOTE — Progress Notes (Signed)
Instruction reviewed, questions, concerns denied. ot is A&OX4

## 2014-12-20 NOTE — Discharge Instructions (Signed)
Dr. Gaynelle Arabian Total Joint Specialist [facility] [address], [place], [place] [address] [phone]  ANTERIOR APPROACH TOTAL HIP REPLACEMENT POSTOPERATIVE DIRECTIONS   Hip Rehabilitation, Guidelines Following Surgery  The results of a hip operation are greatly improved after range of motion and muscle strengthening exercises. Follow all safety measures which are given to protect your hip. If any of these exercises cause increased pain or swelling in your joint, decrease the amount until you are comfortable again. Then slowly increase the exercises. Call your caregiver if you have problems or questions.   HOME CARE INSTRUCTIONS  Remove items at home which could result in a fall. This includes throw rugs or furniture in walking pathways.   ICE to the affected hip every three hours for 30 minutes at a time and then as needed for pain and swelling.  Continue to use ice on the hip for pain and swelling from surgery. You may notice swelling that will progress down to the foot and ankle.  This is normal after surgery.  Elevate the leg when you are not up walking on it.    Continue to use the breathing machine which will help keep your temperature down.  It is common for your temperature to cycle up and down following surgery, especially at night when you are not up moving around and exerting yourself.  The breathing machine keeps your lungs expanded and your temperature down.   DIET You may resume your previous home diet once your are discharged from the hospital.  DRESSING / WOUND CARE / SHOWERING You may shower 3 days after surgery, but keep the wounds dry during showering.  You may use an occlusive plastic wrap (Press'n Seal for example), NO SOAKING/SUBMERGING IN THE BATHTUB.  If the bandage gets wet, change with a clean dry gauze.  If the incision gets wet, pat the wound dry with a clean towel. You may start showering once you are discharged home but do not  submerge the incision under water. Just pat the incision dry and apply a dry gauze dressing on daily. Change the surgical dressing daily and reapply a dry dressing each time.  ACTIVITY Walk with your walker as instructed. Use walker as long as suggested by your caregivers. Avoid periods of inactivity such as sitting longer than an hour when not asleep. This helps prevent blood clots.  You may resume a sexual relationship in one month or when given the OK by your doctor.  You may return to work once you are cleared by your doctor.  Do not drive a car for 6 weeks or until released by you surgeon.  Do not drive while taking narcotics.  WEIGHT BEARING Weight bearing as tolerated with assist device (walker, cane, etc) as directed, use it as long as suggested by your surgeon or therapist, typically at least 4-6 weeks.  POSTOPERATIVE CONSTIPATION PROTOCOL Constipation - defined medically as fewer than three stools per week and severe constipation as less than one stool per week.  One of the most common issues patients have following surgery is constipation.  Even if you have a regular bowel pattern at home, your normal regimen is likely to be disrupted due to multiple reasons following surgery.  Combination of anesthesia, postoperative narcotics, change in appetite and fluid intake all can affect your bowels.  In order to avoid complications following surgery, here are some recommendations in order to help you during your recovery period.  Colace (docusate) - Pick up an over-the-counter  form of Colace or another stool softener and take twice a day as long as you are requiring postoperative pain medications.  Take with a full glass of water daily.  If you experience loose stools or diarrhea, hold the colace until you stool forms back up.  If your symptoms do not get better within 1 week or if they get worse, check with your doctor.  Dulcolax (bisacodyl) - Pick up over-the-counter and take as directed  by the product packaging as needed to assist with the movement of your bowels.  Take with a full glass of water.  Use this product as needed if not relieved by Colace only.   MiraLax (polyethylene glycol) - Pick up over-the-counter to have on hand.  MiraLax is a solution that will increase the amount of water in your bowels to assist with bowel movements.  Take as directed and can mix with a glass of water, juice, soda, coffee, or tea.  Take if you go more than two days without a movement. Do not use MiraLax more than once per day. Call your doctor if you are still constipated or irregular after using this medication for 7 days in a row.  If you continue to have problems with postoperative constipation, please contact the office for further assistance and recommendations.  If you experience "the worst abdominal pain ever" or develop nausea or vomiting, please contact the office immediatly for further recommendations for treatment.  ITCHING  If you experience itching with your medications, try taking only a single pain pill, or even half a pain pill at a time.  You can also use Benadryl over the counter for itching or also to help with sleep.   TED HOSE STOCKINGS Wear the elastic stockings on both legs for three weeks following surgery during the day but you may remove then at night for sleeping.  MEDICATIONS See your medication summary on the After Visit Summary that the nursing staff will review with you prior to discharge.  You may have some home medications which will be placed on hold until you complete the course of blood thinner medication.  It is important for you to complete the blood thinner medication as prescribed by your surgeon.  Continue your approved medications as instructed at time of discharge.  PRECAUTIONS If you experience chest pain or shortness of breath - call 911 immediately for transfer to the hospital emergency department.  If you develop a fever greater that 101 F,  purulent drainage from wound, increased redness or drainage from wound, foul odor from the wound/dressing, or calf pain - CONTACT YOUR SURGEON.                                                   FOLLOW-UP APPOINTMENTS Make sure you keep all of your appointments after your operation with your surgeon and caregivers. You should call the office at the above phone number and make an appointment for approximately two weeks after the date of your surgery or on the date instructed by your surgeon outlined in the "After Visit Summary".  RANGE OF MOTION AND STRENGTHENING EXERCISES  These exercises are designed to help you keep full movement of your hip joint. Follow your caregiver's or physical therapist's instructions. Perform all exercises about fifteen times, three times per day or as directed. Exercise both hips, even if you  have had only one joint replacement. These exercises can be done on a training (exercise) mat, on the floor, on a table or on a bed. Use whatever works the best and is most comfortable for you. Use music or television while you are exercising so that the exercises are a pleasant break in your day. This will make your life better with the exercises acting as a break in routine you can look forward to.  Lying on your back, slowly slide your foot toward your buttocks, raising your knee up off the floor. Then slowly slide your foot back down until your leg is straight again.  Lying on your back spread your legs as far apart as you can without causing discomfort.  Lying on your side, raise your upper leg and foot straight up from the floor as far as is comfortable. Slowly lower the leg and repeat.  Lying on your back, tighten up the muscle in the front of your thigh (quadriceps muscles). You can do this by keeping your leg straight and trying to raise your heel off the floor. This helps strengthen the largest muscle supporting your knee.  Lying on your back, tighten up the muscles of your  buttocks both with the legs straight and with the knee bent at a comfortable angle while keeping your heel on the floor.   IF YOU ARE TRANSFERRED TO A SKILLED REHAB FACILITY If the patient is transferred to a skilled rehab facility following release from the hospital, a list of the current medications will be sent to the facility for the patient to continue.  When discharged from the skilled rehab facility, please have the facility set up the patient's [place] prior to being released. Also, the skilled facility will be responsible for providing the patient with their medications at time of release from the facility to include their pain medication, the muscle relaxants, and their blood thinner medication. If the patient is still at the rehab facility at time of the two week follow up appointment, the skilled rehab facility will also need to assist the patient in arranging follow up appointment in our office and any transportation needs.  MAKE SURE YOU:  Understand these instructions.  Get help right away if you are not doing well or get worse.    Pick up stool softner and laxative for home use following surgery while on pain medications. Do not submerge incision under water. Please use good hand washing techniques while changing dressing each day. May shower starting three days after surgery. Please use a clean towel to pat the incision dry following showers. Continue to use ice for pain and swelling after surgery. Do not use any lotions or creams on the incision until instructed by your surgeon.  Take Coumadin for three weeks for postoperative protocol and then the patient may resume their previous Coumadin home regimen.  The dose may need to be adjusted based upon the INR.  Please follow the INR and titrate Coumadin dose for a therapeutic range between 2.0 and 3.0 INR.  After completing the three weeks of Coumadin, the patient may resume their previous Coumadin home  regimen.  Continue Lovenox injections until the INR is therapeutic at or greater than 2.0.  When INR reaches the therapeutic level of equal to or greater than 2.0, the patient may discontinue the Lovenox injections.

## 2014-12-20 NOTE — Discharge Summary (Signed)
Physician Discharge Summary   Patient ID: DARREON LUTES MRN: [id number] DOB/AGE: August 03, 1950 64 y.o.  Admit date: 12/18/2014 Discharge date: 12-20-2014  Primary Diagnosis:  Osteoarthritis of the Right hip.  Admission Diagnoses:  Past Medical History  Diagnosis Date  . Paroxysmal a-fib ([place])     a. S/P PVI 2/11 and 9/11  . SVT (supraventricular tachycardia) ([place])     a. Atypical AVNRT of the slow pathway  . GERD (gastroesophageal reflux disease)   . Nephrolithiasis   . Dyslipidemia   . Hypertension   . CAD (coronary artery disease)     a. S/P PCI mid LAD (vision stent) 08/31/2004;  b. repeat cath 6/11 - no progression of CAD. c. Cath 10/2012 given moderate CAD on cardiac CT - no change from prior cath.  Marland Kitchen COPD (chronic obstructive pulmonary disease) ([place])   . Hyperthyroidism   . Type II diabetes mellitus ([place])   . WEXHBZJI(967.8)     "sometimes weekly" (11/08/2012)  . DJD (degenerative joint disease) of lumbar spine   . Arthritis     "in my back and right leg" (11/08/2012)  . Chronic lower back pain   . On home oxygen therapy     "2L q hs" (11/08/2012)  . Palpitations     a. 10/2012 - s/p LINQ loop recorder to assess for arrhythmia.   Marland Kitchen Hypomagnesemia     a. 10/2012 - Mg 0.7.  . Hypokalemia   . Shortness of breath dyspnea     with exertion   . Cancer ([place])     melanoma removed from neck   . OSA (obstructive sleep apnea)     a. cpap noncompliance- patient reports on 12/10/14- does not use CPAP   Discharge Diagnoses:   Principal Problem:   OA (osteoarthritis) of hip  Estimated body mass index is 36.07 kg/(m^2) as calculated from the following:   Height as of this encounter: 6' (1.829 m).   Weight as of this encounter: 120.657 kg (266 lb).  Procedure(s) (LRB): RIGHT TOTAL HIP ARTHROPLASTY ANTERIOR APPROACH (Right)   Consults: None  HPI: Steve Anthony is a 64 y.o. male who has advanced end-  stage arthritis of his Right hip with progressively worsening pain and   dysfunction.The patient has failed nonoperative management and presents for  total hip arthroplasty.  Laboratory Data: Admission on 12/18/2014  Component Date Value Ref Range Status  . Prothrombin Time 12/18/2014 14.7  11.6 - 15.2 seconds Final  . INR 12/18/2014 1.13  0.00 - 1.49 Final  . Glucose-Capillary 12/18/2014 111* 65 - 99 mg/dL Final  . Glucose-Capillary 12/18/2014 102* 65 - 99 mg/dL Final  . Comment 1 12/18/2014 Document in Chart   Final  . Glucose-Capillary 12/18/2014 171* 65 - 99 mg/dL Final  . Comment 1 12/18/2014 Notify RN   Final  . Comment 2 12/18/2014 Document in Chart   Final  . WBC 12/19/2014 14.7* 4.0 - 10.5 K/uL Final  . RBC 12/19/2014 5.14  4.22 - 5.81 MIL/uL Final  . Hemoglobin 12/19/2014 14.3  13.0 - 17.0 g/dL Final  . HCT 12/19/2014 43.0  39.0 - 52.0 % Final  . MCV 12/19/2014 83.7  78.0 - 100.0 fL Final  . MCH 12/19/2014 27.8  26.0 - 34.0 pg Final  . MCHC 12/19/2014 33.3  30.0 - 36.0 g/dL Final  . RDW 12/19/2014 14.2  11.5 - 15.5 % Final  . Platelets 12/19/2014 171  150 - 400 K/uL Final  . Sodium 12/19/2014 137  135 -  145 mmol/L Final  . Potassium 12/19/2014 3.8  3.5 - 5.1 mmol/L Final  . Chloride 12/19/2014 103  101 - 111 mmol/L Final  . CO2 12/19/2014 25  22 - 32 mmol/L Final  . Glucose, Bld 12/19/2014 210* 65 - 99 mg/dL Final  . BUN 12/19/2014 10  6 - 20 mg/dL Final  . Creatinine, Ser 12/19/2014 0.89  0.61 - 1.24 mg/dL Final  . Calcium 12/19/2014 8.5* 8.9 - 10.3 mg/dL Final  . GFR calc non Af Amer 12/19/2014 >60  >60 mL/min Final  . GFR calc Af Amer 12/19/2014 >60  >60 mL/min Final   Comment: (NOTE) The eGFR has been calculated using the CKD EPI equation. This calculation has not been validated in all clinical situations. eGFR's persistently <60 mL/min signify possible Chronic Kidney Disease.   . Anion gap 12/19/2014 9  5 - 15 Final  . Prothrombin Time 12/19/2014 14.8  11.6 - 15.2 seconds Final  . INR 12/19/2014 1.14  0.00 - 1.49 Final  .  Glucose-Capillary 12/18/2014 200* 65 - 99 mg/dL Final  . Glucose-Capillary 12/19/2014 141* 65 - 99 mg/dL Final  . Glucose-Capillary 12/19/2014 169* 65 - 99 mg/dL Final  . WBC 12/20/2014 8.0  4.0 - 10.5 K/uL Final  . RBC 12/20/2014 4.18* 4.22 - 5.81 MIL/uL Final  . Hemoglobin 12/20/2014 11.8* 13.0 - 17.0 g/dL Final  . HCT 12/20/2014 36.0* 39.0 - 52.0 % Final  . MCV 12/20/2014 86.1  78.0 - 100.0 fL Final  . MCH 12/20/2014 28.2  26.0 - 34.0 pg Final  . MCHC 12/20/2014 32.8  30.0 - 36.0 g/dL Final  . RDW 12/20/2014 14.6  11.5 - 15.5 % Final  . Platelets 12/20/2014 149* 150 - 400 K/uL Final  . Sodium 12/20/2014 139  135 - 145 mmol/L Final  . Potassium 12/20/2014 3.9  3.5 - 5.1 mmol/L Final  . Chloride 12/20/2014 103  101 - 111 mmol/L Final  . CO2 12/20/2014 25  22 - 32 mmol/L Final  . Glucose, Bld 12/20/2014 168* 65 - 99 mg/dL Final  . BUN 12/20/2014 14  6 - 20 mg/dL Final  . Creatinine, Ser 12/20/2014 0.82  0.61 - 1.24 mg/dL Final  . Calcium 12/20/2014 8.2* 8.9 - 10.3 mg/dL Final  . GFR calc non Af Amer 12/20/2014 >60  >60 mL/min Final  . GFR calc Af Amer 12/20/2014 >60  >60 mL/min Final   Comment: (NOTE) The eGFR has been calculated using the CKD EPI equation. This calculation has not been validated in all clinical situations. eGFR's persistently <60 mL/min signify possible Chronic Kidney Disease.   . Anion gap 12/20/2014 11  5 - 15 Final  . Prothrombin Time 12/20/2014 15.8* 11.6 - 15.2 seconds Final  . INR 12/20/2014 1.24  0.00 - 1.49 Final  . Glucose-Capillary 12/19/2014 205* 65 - 99 mg/dL Final  . Glucose-Capillary 12/19/2014 229* 65 - 99 mg/dL Final  . Comment 1 12/19/2014 Notify RN   Final  . Comment 2 12/19/2014 Document in Chart   Final  . Glucose-Capillary 12/20/2014 142* 65 - 99 mg/dL Final  Hospital Outpatient Visit on 12/10/2014  Component Date Value Ref Range Status  . aPTT 12/10/2014 32  24 - 37 seconds Final  . WBC 12/10/2014 6.7  4.0 - 10.5 K/uL Final  . RBC  12/10/2014 4.97  4.22 - 5.81 MIL/uL Final  . Hemoglobin 12/10/2014 13.7  13.0 - 17.0 g/dL Final  . HCT 12/10/2014 42.0  39.0 - 52.0 % Final  . MCV  12/10/2014 84.5  78.0 - 100.0 fL Final  . MCH 12/10/2014 27.6  26.0 - 34.0 pg Final  . MCHC 12/10/2014 32.6  30.0 - 36.0 g/dL Final  . RDW 12/10/2014 14.0  11.5 - 15.5 % Final  . Platelets 12/10/2014 136* 150 - 400 K/uL Final  . Sodium 12/10/2014 138  135 - 145 mmol/L Final  . Potassium 12/10/2014 3.5  3.5 - 5.1 mmol/L Final  . Chloride 12/10/2014 101  101 - 111 mmol/L Final  . CO2 12/10/2014 29  22 - 32 mmol/L Final  . Glucose, Bld 12/10/2014 145* 65 - 99 mg/dL Final  . BUN 12/10/2014 13  6 - 20 mg/dL Final  . Creatinine, Ser 12/10/2014 0.98  0.61 - 1.24 mg/dL Final  . Calcium 12/10/2014 8.9  8.9 - 10.3 mg/dL Final  . Total Protein 12/10/2014 7.0  6.5 - 8.1 g/dL Final  . Albumin 12/10/2014 4.2  3.5 - 5.0 g/dL Final  . AST 12/10/2014 29  15 - 41 U/L Final  . ALT 12/10/2014 26  17 - 63 U/L Final  . Alkaline Phosphatase 12/10/2014 34* 38 - 126 U/L Final  . Total Bilirubin 12/10/2014 0.7  0.3 - 1.2 mg/dL Final  . GFR calc non Af Amer 12/10/2014 >60  >60 mL/min Final  . GFR calc Af Amer 12/10/2014 >60  >60 mL/min Final   Comment: (NOTE) The eGFR has been calculated using the CKD EPI equation. This calculation has not been validated in all clinical situations. eGFR's persistently <60 mL/min signify possible Chronic Kidney Disease.   . Anion gap 12/10/2014 8  5 - 15 Final  . Prothrombin Time 12/10/2014 23.1* 11.6 - 15.2 seconds Final  . INR 12/10/2014 2.06* 0.00 - 1.49 Final  . ABO/RH(D) 12/10/2014 AB POS   Final  . Antibody Screen 12/10/2014 NEG   Final  . Sample Expiration 12/10/2014 12/19/2014   Final  . Extend sample reason 12/10/2014 NO TRANSFUSIONS OR PREGNANCY IN THE PAST 3 MONTHS   Final  . Color, Urine 12/10/2014 YELLOW  YELLOW Final  . APPearance 12/10/2014 CLEAR  CLEAR Final  . Specific Gravity, Urine 12/10/2014 1.004* 1.005 -  1.030 Final  . pH 12/10/2014 6.0  5.0 - 8.0 Final  . Glucose, UA 12/10/2014 NEGATIVE  NEGATIVE mg/dL Final  . Hgb urine dipstick 12/10/2014 NEGATIVE  NEGATIVE Final  . Bilirubin Urine 12/10/2014 NEGATIVE  NEGATIVE Final  . Ketones, ur 12/10/2014 NEGATIVE  NEGATIVE mg/dL Final  . Protein, ur 12/10/2014 NEGATIVE  NEGATIVE mg/dL Final  . Nitrite 12/10/2014 NEGATIVE  NEGATIVE Final  . Leukocytes, UA 12/10/2014 NEGATIVE  NEGATIVE Final   MICROSCOPIC NOT DONE ON URINES WITH NEGATIVE PROTEIN, BLOOD, LEUKOCYTES, NITRITE, OR GLUCOSE <1000 mg/dL.  Marland Kitchen MRSA, PCR 12/10/2014 NEGATIVE  NEGATIVE Final  . Staphylococcus aureus 12/10/2014 POSITIVE* NEGATIVE Final   Comment:        The Xpert SA Assay (FDA approved for NASAL specimens in patients over 44 years of age), is one component of a comprehensive surveillance program.  Test performance has been validated by [facility] for patients greater than or equal to 67 year old. It is not intended to diagnose infection nor to guide or monitor treatment.   . ABO/RH(D) 12/10/2014 AB POS   Final  Clinical Support on 11/08/2014  Component Date Value Ref Range Status  . Date Time Interrogation Session 11/08/2014 [id number]   Final  . Pulse Generator Manufacturer 11/08/2014 MERM   Final  . Pulse Gen Model 11/08/2014 TDS28 Reveal LINQ  Final  . Pulse Gen Serial Number 11/08/2014 [id number]   Final  . Implantable Pulse Generator Type 11/08/2014 ICM/ILR   Final  . Implantable Pulse Generator Implan* 11/08/2014 [id number]   Final  . Eval Rhythm 11/08/2014 SR   Final     X-Rays:Dg Pelvis Portable  12/18/2014  CLINICAL DATA:  Postoperative right hip arthroplasty. EXAM: PORTABLE PELVIS 1-2 VIEWS COMPARISON:  08/12/2011 FINDINGS: Postoperative right hip arthroplasty using non cemented components. Components appear well seated. There is no evidence of acute fracture or dislocation. Surgical drain and soft tissue emphysema consistent with recent  surgery. Postoperative changes in the lower lumbosacral spine. SI joints and symphysis pubis are not displaced. Degenerative changes in the left hip. IMPRESSION: Postoperative right hip arthroplasty. Components appear well seated. Electronically Signed   By: Lucienne Capers M.D.   On: 12/18/2014 18:06   Dg C-arm 1-60 Min-no Report  12/18/2014  CLINICAL DATA: hip C-ARM 1-60 MINUTES Fluoroscopy was utilized by the requesting physician.  No radiographic interpretation.    EKG: Orders placed or performed during the hospital encounter of 06/07/14  . EKG 12-Lead  . EKG 12-Lead     Hospital Course: Patient was admitted to [facility] and taken to the OR and underwent the above state procedure without complications.  Patient tolerated the procedure well and was later transferred to the recovery room and then to the orthopaedic floor for postoperative care.  They were given PO and IV analgesics for pain control following their surgery.  They were given 24 hours of postoperative antibiotics of  Anti-infectives    Start     Dose/Rate Route Frequency Ordered Stop   12/19/14 0400  vancomycin (VANCOCIN) IVPB 1000 mg/200 mL premix     1,000 mg 200 mL/hr over 60 Minutes Intravenous Every 12 hours 12/18/14 1839 12/19/14 0433   12/18/14 0600  vancomycin (VANCOCIN) 1,500 mg in sodium chloride 0.9 % 500 mL IVPB     1,500 mg 250 mL/hr over 120 Minutes Intravenous On call to O.R. 12/17/14 1336 12/18/14 1623     and started on DVT prophylaxis in the form of Lovenox and Coumadin.   PT and OT were ordered for total hip protocol.  The patient was allowed to be WBAT with therapy. Discharge planning was consulted to help with postop disposition and equipment needs.  Patient had a tough night on the evening of surgery.  They started to get up OOB with therapy on day one.  Hemovac drain was pulled without difficulty.  Continued to work with therapy into day two.  Dressing was changed on day two and the incision  was healing well.  Patient was seen in rounds on day two and felt better and was ready to go home.   Discharge home with home health Diet - Cardiac diet Follow up - in 2 weeks Activity - WBAT Disposition - Home Condition Upon Discharge - Good D/C Meds - See DC Summary DVT Prophylaxis - Lovenox and Coumadin  Discharge Instructions    Call MD / Call 911    Complete by:  As directed   If you experience chest pain or shortness of breath, CALL 911 and be transported to the hospital emergency room.  If you develope a fever above 101 F, pus (white drainage) or increased drainage or redness at the wound, or calf pain, call your surgeon's office.     Change dressing    Complete by:  As directed   You may change your dressing dressing daily  with sterile 4 x 4 inch gauze dressing and paper tape.  Do not submerge the incision under water.     Constipation Prevention    Complete by:  As directed   Drink plenty of fluids.  Prune juice may be helpful.  You may use a stool softener, such as Colace (over the counter) 100 mg twice a day.  Use MiraLax (over the counter) for constipation as needed.     Diet - low sodium heart healthy    Complete by:  As directed      Discharge instructions    Complete by:  As directed   Pick up stool softner and laxative for home use following surgery while on pain medications. Do not submerge incision under water. Please use good hand washing techniques while changing dressing each day. May shower starting three days after surgery. Please use a clean towel to pat the incision dry following showers. Continue to use ice for pain and swelling after surgery. Do not use any lotions or creams on the incision until instructed by your surgeon.  Total Hip Protocol.  Take Coumadin for three weeks for postoperative protocol and then the patient may resume their previous Coumadin home regimen.  The dose may need to be adjusted based upon the INR.  Please follow the INR and  titrate Coumadin dose for a therapeutic range between 2.0 and 3.0 INR.  After completing the three weeks of Coumadin, the patient may resume their previous Coumadin home regimen.  Continue Lovenox injections until the INR is therapeutic at or greater than 2.0.  When INR reaches the therapeutic level of equal to or greater than 2.0, the patient may discontinue the Lovenox injections.  Postoperative Constipation Protocol  Constipation - defined medically as fewer than three stools per week and severe constipation as less than one stool per week.  One of the most common issues patients have following surgery is constipation.  Even if you have a regular bowel pattern at home, your normal regimen is likely to be disrupted due to multiple reasons following surgery.  Combination of anesthesia, postoperative narcotics, change in appetite and fluid intake all can affect your bowels.  In order to avoid complications following surgery, here are some recommendations in order to help you during your recovery period.  Colace (docusate) - Pick up an over-the-counter form of Colace or another stool softener and take twice a day as long as you are requiring postoperative pain medications.  Take with a full glass of water daily.  If you experience loose stools or diarrhea, hold the colace until you stool forms back up.  If your symptoms do not get better within 1 week or if they get worse, check with your doctor.  Dulcolax (bisacodyl) - Pick up over-the-counter and take as directed by the product packaging as needed to assist with the movement of your bowels.  Take with a full glass of water.  Use this product as needed if not relieved by Colace only.   MiraLax (polyethylene glycol) - Pick up over-the-counter to have on hand.  MiraLax is a solution that will increase the amount of water in your bowels to assist with bowel movements.  Take as directed and can mix with a glass of water, juice, soda, coffee, or tea.  Take  if you go more than two days without a movement. Do not use MiraLax more than once per day. Call your doctor if you are still constipated or irregular after using this medication for  7 days in a row.  If you continue to have problems with postoperative constipation, please contact the office for further assistance and recommendations.  If you experience "the worst abdominal pain ever" or develop nausea or vomiting, please contact the office immediatly for further recommendations for treatment.     Do not sit on low chairs, stoools or toilet seats, as it may be difficult to get up from low surfaces    Complete by:  As directed      Driving restrictions    Complete by:  As directed   No driving until released by the physician.     Increase activity slowly as tolerated    Complete by:  As directed      Lifting restrictions    Complete by:  As directed   No lifting until released by the physician.     Patient may shower    Complete by:  As directed   You may shower without a dressing once there is no drainage.  Do not wash over the wound.  If drainage remains, do not shower until drainage stops.     TED hose    Complete by:  As directed   Use stockings (TED hose) for 3 weeks on both leg(s).  You may remove them at night for sleeping.     Weight bearing as tolerated    Complete by:  As directed   Laterality:  right  Extremity:  Lower            Medication List    STOP taking these medications        HYDROcodone-acetaminophen 7.5-325 MG tablet  Commonly known as:  NORCO      TAKE these medications        amLODipine 5 MG tablet  Commonly known as:  NORVASC  Take 1 tablet (5 mg total) by mouth daily.     aspirin 81 MG EC tablet  Take 1 tablet (81 mg total) by mouth daily.     atorvastatin 40 MG tablet  Commonly known as:  LIPITOR  Take 40 mg by mouth at bedtime.     clonazePAM 1 MG tablet  Commonly known as:  KLONOPIN  TAKE 1 TABLET BY MOUTH EVERY NIGHT AT BEDTIME      cloNIDine 0.2 MG tablet  Commonly known as:  CATAPRES  Take 0.2-0.4 mg by mouth 3 (three) times daily. Take 0.371m by mouth in the morning, 0.472mby mouth at lunch, and 0.71m71my mouth at dinner     COMBIVENT 18-103 MCG/ACT inhaler  Generic drug:  albuterol-ipratropium  Inhale 2 puffs into the lungs every 4 (four) hours as needed for wheezing or shortness of breath.     cyanocobalamin 1000 MCG tablet  Take 1,000 mcg by mouth daily.     docusate sodium 100 MG capsule  Commonly known as:  COLACE  Take 100 mg by mouth daily.     enoxaparin 30 MG/0.3ML injection  Commonly known as:  LOVENOX  Inject 0.3 mLs (30 mg total) into the skin every 12 (twelve) hours. Continue Lovenox injections until the INR is therapeutic at or greater than 2.0.  When INR reaches the therapeutic level of equal to or greater than 2.0, the patient may discontinue the Lovenox injections.     fenofibrate 160 MG tablet  Take 160 mg by mouth daily.     fentaNYL 25 MCG/HR patch  Commonly known as:  DURKalihiwaidosed mcg/hr  Place 1 patch onto the  skin every 3 (three) days.     furosemide 20 MG tablet  Commonly known as:  LASIX  Take 20 mg by mouth daily as needed for fluid.     losartan 50 MG tablet  Commonly known as:  COZAAR  Take 100 mg by mouth daily.     Melatonin 5 MG Tabs  Take 5 mg by mouth at bedtime.     metFORMIN 500 MG 24 hr tablet  Commonly known as:  GLUCOPHAGE-XR  Take 2 tablets (1,000 mg total) by mouth 2 (two) times daily.     methocarbamol 500 MG tablet  Commonly known as:  ROBAXIN  Take 1 tablet (500 mg total) by mouth every 6 (six) hours as needed for muscle spasms.     metoprolol succinate 50 MG 24 hr tablet  Commonly known as:  TOPROL-XL  TAKE 1 TABLET BY MOUTH TWICE A DAY     nitroGLYCERIN 0.4 MG SL tablet  Commonly known as:  NITROSTAT  Place 0.4 mg under the tongue every 5 (five) minutes as needed for chest pain. May repeat up to 3 doses.     nystatin 100000 UNIT/GM Powd    APPLY 2-3 TIMES DAILY TO AFFECTED AREA(S) as needed for itching.     oxyCODONE 5 MG immediate release tablet  Commonly known as:  Oxy IR/ROXICODONE  Take 1-2 tablets (5-10 mg total) by mouth every 3 (three) hours as needed for moderate pain or severe pain.     pantoprazole 40 MG tablet  Commonly known as:  PROTONIX  Take 40 mg by mouth daily.     sodium chloride 0.65 % Soln nasal spray  Commonly known as:  OCEAN  Place 1 spray into both nostrils every 6 (six) hours as needed for congestion.     spironolactone 25 MG tablet  Commonly known as:  ALDACTONE  Take 25 mg by mouth daily.     tamsulosin 0.4 MG Caps capsule  Commonly known as:  FLOMAX  Take 0.4 mg by mouth daily.     traMADol 50 MG tablet  Commonly known as:  ULTRAM  Take 1-2 tablets (50-100 mg total) by mouth every 6 (six) hours as needed (mild pain).     warfarin 5 MG tablet  Commonly known as:  COUMADIN  Take 1-1.5 tablets (5-7.5 mg total) by mouth daily. Take Coumadin for 3 weeks for postoperative, then the patient may resume their previous Coumadin home regimen.  Dose may need to be adjusted based upon the INR.  Follow the INR and titrate Coumadin dose for a therapeutic range between 2.0 and 3.0 INR.  After completing the three weeks of Coumadin, patient may resume their previous Coumadin home regimen.  Takes 57m daily except 7.550mon MWF.           Follow-up Information    Follow up with ALGearlean AlfMD On 12/31/2014.   Specialty:  Orthopedic Surgery   Why:  Call office at [phone] for appointment, questions, or concerns.   Contact information:   [phone]     Signed: DrArlee MuslimPA-C Orthopaedic Surgery 12/20/2014, 8:07 AM

## 2014-12-20 NOTE — Progress Notes (Addendum)
Physical Therapy Treatment Patient Details Name: Steve Anthony MRN: [id number] DOB: [birth year] Today's Date: 12/20/2014    History of Present Illness pt s/p R DATHA , history of spinal surgery    PT Comments    Pt progressing well, has ramp at home so did not practice stairs, will need HHPT; wife able to assist prn at home  Follow Up Recommendations  Home health PT     Equipment Recommendations  Rolling walker with 5" wheels    Recommendations for Other Services       Precautions / Restrictions Precautions Precautions: Fall Precaution Comments: wears O2 at night at home/monitor sats Restrictions Weight Bearing Restrictions: No RLE Weight Bearing: Weight bearing as tolerated    Mobility  Bed Mobility               General bed mobility comments: in recliner upon arrival, states he slept in bed, but prefers to sit in recliner during the day   Transfers Overall transfer level: Needs assistance Equipment used: Rolling walker (2 wheeled) Transfers: Sit to/from Stand Sit to Stand: Supervision         General transfer comment: min cues for hand placement on RW  Ambulation/Gait Ambulation/Gait assistance: Min guard Ambulation Distance (Feet): 120 Feet Assistive device: Rolling walker (2 wheeled) Gait Pattern/deviations: Step-to pattern;Antalgic     General Gait Details: cues for UEs use on RW, posture, and breathing, increased time, min guard for safety. Oxygen saturation remained >90% through PT session    Stairs            Wheelchair Mobility    Modified Rankin (Stroke Patients Only)       Balance                                    Cognition Arousal/Alertness: Awake/alert Behavior During Therapy: WFL for tasks assessed/performed Overall Cognitive Status: Within Functional Limits for tasks assessed                      Exercises Total Joint Exercises Ankle Circles/Pumps: Both;AROM;20 reps Quad Sets:  AROM;Strengthening;Right;10 reps Towel Squeeze: AROM;Strengthening;Both;10 reps Short Arc Quad: AROM;Right;10 reps Heel Slides: AROM;Right;10 reps Straight Leg Raises: AAROM;Strengthening;Right;10 reps    General Comments        Pertinent Vitals/Pain Pain Assessment: 0-10 Pain Score: 10-Worst pain ever Pain Location: R hip, worse during amb Pain Descriptors / Indicators: Burning;Pounding Pain Intervention(s): Limited activity within patient's tolerance;Monitored during session;Premedicated before session;Repositioned;Ice applied    Home Living                      Prior Function            PT Goals (current goals can now be found in the care plan section) Acute Rehab PT Goals Patient Stated Goal: be able to walk PT Goal Formulation: With patient Time For Goal Achievement: 01/02/15 Potential to Achieve Goals: Good Progress towards PT goals: Progressing toward goals    Frequency  7X/week    PT Plan Current plan remains appropriate    Co-evaluation             End of Session Equipment Utilized During Treatment: Gait belt Activity Tolerance: Patient tolerated treatment well Patient left: in chair;with family/visitor present;with call bell/phone within reach;with chair alarm set     Time: 5631-4970 PT Time Calculation (min) (ACUTE ONLY): 33 min  Charges:  $  Gait Training: 8-22 mins $Therapeutic Exercise: 8-22 mins                    G Codes:   Treatment and documentation done by Ileana Roup, SPT Reviewed by Kenyon Ana, PT     [facility] 12/20/2014, 1:30 PM

## 2014-12-20 NOTE — Progress Notes (Signed)
Subjective: 2 Days Post-Op Procedure(s) (LRB): RIGHT TOTAL HIP ARTHROPLASTY ANTERIOR APPROACH (Right) Patient reports pain as mild.   Patient seen in rounds with Dr. Wynelle Link. Patient is well, and has had no acute complaints or problems Patient is ready to go home today after therapy  Objective: Vital signs in last 24 hours: Temp:  [97.9 F (36.6 C)-98.6 F (37 C)] 98.6 F (37 C) (11/25 0415) Pulse Rate:  [72-79] 79 (11/25 0415) Resp:  [16] 16 (11/25 0415) BP: (132-145)/(74-86) 145/86 mmHg (11/25 0415) SpO2:  [93 %-94 %] 93 % (11/25 0415)  Intake/Output from previous day:  Intake/Output Summary (Last 24 hours) at 12/20/14 0758 Last data filed at 12/20/14 0556  Gross per 24 hour  Intake   1647 ml  Output   1218 ml  Net    429 ml    Labs:  Recent Labs  12/19/14 0451 12/20/14 0419  HGB 14.3 11.8*    Recent Labs  12/19/14 0451 12/20/14 0419  WBC 14.7* 8.0  RBC 5.14 4.18*  HCT 43.0 36.0*  PLT 171 149*    Recent Labs  12/19/14 0451 12/20/14 0419  NA 137 139  K 3.8 3.9  CL 103 103  CO2 25 25  BUN 10 14  CREATININE 0.89 0.82  GLUCOSE 210* 168*  CALCIUM 8.5* 8.2*    Recent Labs  12/19/14 0451 12/20/14 0419  INR 1.14 1.24    EXAM: General - Patient is Alert, Appropriate and Oriented Extremity - Neurovascular intact Sensation intact distally Incision - clean, dry, no drainage Motor Function - intact, moving foot and toes well on exam.   Assessment/Plan: 2 Days Post-Op Procedure(s) (LRB): RIGHT TOTAL HIP ARTHROPLASTY ANTERIOR APPROACH (Right) Procedure(s) (LRB): RIGHT TOTAL HIP ARTHROPLASTY ANTERIOR APPROACH (Right) Past Medical History  Diagnosis Date  . Paroxysmal a-fib ([place])     a. S/P PVI 2/11 and 9/11  . SVT (supraventricular tachycardia) ([place])     a. Atypical AVNRT of the slow pathway  . GERD (gastroesophageal reflux disease)   . Nephrolithiasis   . Dyslipidemia   . Hypertension   . CAD (coronary artery disease)     a. S/P PCI  mid LAD (vision stent) 08/31/2004;  b. repeat cath 6/11 - no progression of CAD. c. Cath 10/2012 given moderate CAD on cardiac CT - no change from prior cath.  Marland Kitchen COPD (chronic obstructive pulmonary disease) ([place])   . Hyperthyroidism   . Type II diabetes mellitus ([place])   . TGGYIRSW(546.2)     "sometimes weekly" (11/08/2012)  . DJD (degenerative joint disease) of lumbar spine   . Arthritis     "in my back and right leg" (11/08/2012)  . Chronic lower back pain   . On home oxygen therapy     "2L q hs" (11/08/2012)  . Palpitations     a. 10/2012 - s/p LINQ loop recorder to assess for arrhythmia.   Marland Kitchen Hypomagnesemia     a. 10/2012 - Mg 0.7.  . Hypokalemia   . Shortness of breath dyspnea     with exertion   . Cancer ([place])     melanoma removed from neck   . OSA (obstructive sleep apnea)     a. cpap noncompliance- patient reports on 12/10/14- does not use CPAP   Principal Problem:   OA (osteoarthritis) of hip  Estimated body mass index is 36.07 kg/(m^2) as calculated from the following:   Height as of this encounter: 6' (1.829 m).   Weight as of this encounter:  120.657 kg (266 lb). Up with therapy Discharge home with home health Diet - Cardiac diet Follow up - in 2 weeks Activity - WBAT Disposition - Home Condition Upon Discharge - Good D/C Meds - See DC Summary DVT Prophylaxis - Lovenox and Coumadin  Arlee Muslim, PA-C Orthopaedic Surgery 12/20/2014, 7:58 AM

## 2014-12-20 NOTE — Progress Notes (Signed)
ANTICOAGULATION CONSULT NOTE - Follow Up  Pharmacy Consult for Warfarin Indication: chronic Warfarin for Afib, post-op THA  Allergies  Allergen Reactions  . Penicillins Hives and Shortness Of Breath    Has patient had a PCN reaction causing immediate rash, facial/tongue/throat swelling, SOB or lightheadedness with hypotension: Yes Has patient had a PCN reaction causing severe rash involving mucus membranes or skin necrosis: No Has patient had a PCN reaction that required hospitalization: Yes Has patient had a PCN reaction occurring within the last 10 years: No If all of the above answers are "NO", then may proceed with Cephalosporin use.    Patient Measurements: Height: 6' (182.9 cm) Weight: 266 lb (120.657 kg) IBW/kg (Calculated) : 77.6  Vital Signs: Temp: 98.6 F (37 C) (11/25 0415) Temp Source: Oral (11/25 0415) BP: 145/91 mmHg (11/25 1052) Pulse Rate: 70 (11/25 1052)  Labs:  Recent Labs  12/18/14 1310 12/19/14 0451 12/20/14 0419  HGB  --  14.3 11.8*  HCT  --  43.0 36.0*  PLT  --  171 149*  LABPROT 14.7 14.8 15.8*  INR 1.13 1.14 1.24  CREATININE  --  0.89 0.82   Estimated Creatinine Clearance: 123.6 mL/min (by C-G formula based on Cr of 0.82).  Medications:  Scheduled:  . amLODipine  5 mg Oral Daily  . atorvastatin  40 mg Oral QHS  . clonazePAM  1 mg Oral QHS  . cloNIDine  0.2 mg Oral q morning - 10a  . cloNIDine  0.4 mg Oral BID  . docusate sodium  100 mg Oral BID  . [START ON 12/21/2014] enoxaparin (LOVENOX) injection  40 mg Subcutaneous Q24H  . fentaNYL  25 mcg Transdermal Q3 days  . insulin aspart  0-15 Units Subcutaneous TID WC  . losartan  100 mg Oral Daily  . metFORMIN  1,000 mg Oral BID WC  . metoprolol succinate  50 mg Oral BID  . pantoprazole  40 mg Oral Daily  . spironolactone  25 mg Oral Daily  . tamsulosin  0.4 mg Oral Daily  . Warfarin - Pharmacist Dosing Inpatient   Does not apply q1800   Assessment: 35 yoM s/p R anterior THA. On  chronic Warfarin for Afib, home dose 7.44m MWF, 553mother days, last pre-op dose 11/17. Admit INR 1.13, Lovenox bridge began 11/24.   Today, 12/20/2014   INR subtherapeutic, but trending to goal range  Drop in hgb noted- ABLA post-op  No active bleeding reported  Goal of Therapy:  INR 2-3 Monitor platelets by anticoagulation protocol: Yes   Plan:   Continue Lovenox 4061maily  Warfarin 7.5mg80m tonight  Daily CBC, PT/INR   Resume previous home regimen at discharge  MichNetta CedarsarmD, BCPS Pager: [phone]/2016, 11:51 AM

## 2014-12-20 NOTE — Progress Notes (Signed)
Pt and wife at bedside selected [facility] for [place]. Referral called to [facility] [phone] and info faxed to [phone].

## 2014-12-20 NOTE — Evaluation (Signed)
Occupational Therapy Evaluation Patient Details Name: Steve Anthony MRN: [id number] DOB: 28-Dec-1950 Today's Date: 12/20/2014    History of Present Illness pt s/p R DATHA , history of spinal surgery   Clinical Impression   Pt was admitted for the above. All education was completed.  No further OT is needed at this time    Follow Up Recommendations  No OT follow up    Equipment Recommendations  None recommended by OT    Recommendations for Other Services       Precautions / Restrictions Precautions Precautions: Fall Precaution Comments: wears O2 at night at home/monitor sats Restrictions Weight Bearing Restrictions: Yes RLE Weight Bearing: Weight bearing as tolerated      Mobility Bed Mobility                  Transfers   Equipment used: Rolling walker (2 wheeled) Transfers: Sit to/from Stand Sit to Stand: Supervision         General transfer comment: cues for LE placement    Balance                                            ADL Overall ADL's : Needs assistance/impaired                         Toilet Transfer: Min guard;Stand-pivot;BSC;Requires wide/bariatric             General ADL Comments: pt was on 3:1 commode when OT arrived.  Pain increased from 8 to 10 when taking a few steps back to chair.  He was awaiting pain medication.  Reviewed working within pain tolerance for adls. He has AE from when he had back surgery, and wife will also assist.  Reviewed shower transfer, backing in and coming out forward.       Vision     Perception     Praxis      Pertinent Vitals/Pain Pain Score: 10-Worst pain ever (initially 8, increased with weight bearing) Pain Location: R hip Pain Descriptors / Indicators: Aching Pain Intervention(s): Limited activity within patient's tolerance;Monitored during session;Premedicated before session;Ice applied;Patient requesting pain meds-RN notified     Hand Dominance      Extremity/Trunk Assessment Upper Extremity Assessment Upper Extremity Assessment: Overall WFL for tasks assessed           Communication Communication Communication: No difficulties   Cognition Arousal/Alertness: Awake/alert Behavior During Therapy: WFL for tasks assessed/performed Overall Cognitive Status: Within Functional Limits for tasks assessed                     General Comments       Exercises       Shoulder Instructions      Home Living Family/patient expects to be discharged to:: Private residence Living Arrangements: Spouse/significant other Available Help at Discharge: Family Type of Home: Mobile home             Bathroom Shower/Tub: Walk-in shower   Bathroom Toilet: Standard     Home Equipment: Bedside commode          Prior Functioning/Environment Level of Independence: Independent        Comments: pt has h/o back issues but able to bring legs up and don clothing    OT Diagnosis: Acute pain   OT Problem List:  OT Treatment/Interventions:      OT Goals(Current goals can be found in the care plan section) Acute Rehab OT Goals Patient Stated Goal: be able to walk  OT Frequency:     Barriers to D/C:            Co-evaluation              End of Session    Activity Tolerance: Patient limited by pain Patient left: in chair;with call bell/phone within reach;with nursing/sitter in room;with family/visitor present   Time: 8473-0856 OT Time Calculation (min): 8 min Charges:  OT General Charges $OT Visit: 1 Procedure OT Evaluation $Initial OT Evaluation Tier I: 1 Procedure G-Codes:    Vianca Bracher 12-30-2014, 10:03 AM  Lesle Chris, OTR/L [phone] 12-30-14

## 2015-01-01 ENCOUNTER — Ambulatory Visit (INDEPENDENT_AMBULATORY_CARE_PROVIDER_SITE_OTHER): Payer: Medicare PPO | Admitting: Internal Medicine

## 2015-01-01 ENCOUNTER — Encounter: Payer: Self-pay | Admitting: Internal Medicine

## 2015-01-01 VITALS — BP 122/92 | HR 87 | Ht 72.0 in | Wt 258.4 lb

## 2015-01-01 DIAGNOSIS — I480 Paroxysmal atrial fibrillation: Secondary | ICD-10-CM | POA: Diagnosis not present

## 2015-01-01 DIAGNOSIS — I2510 Atherosclerotic heart disease of native coronary artery without angina pectoris: Secondary | ICD-10-CM | POA: Diagnosis not present

## 2015-01-01 DIAGNOSIS — I10 Essential (primary) hypertension: Secondary | ICD-10-CM | POA: Diagnosis not present

## 2015-01-01 NOTE — Progress Notes (Signed)
ELECTROPHYSIOLOGY OFFICE NOTE   Patient ID: Steve Anthony MRN: [id number], DOB/AGE: 1951-01-22   Date of Visit: 01/01/2015  Primary Physician: [name], MD Primary Cardiologist: Thompson Grayer, MD  History of Present Illness  Steve Anthony is a 64 y.o. male with atrial fibrillation s/p PVI ablation 2011, PSVT s/p EPS +RF ablation of AVNRT, CAD, COPD and OSA who underwent ILR insertion in Oct 2014 for further evaluation of palpitations.    He presents today for routine electrophysiology followup. Since last being seen in our clinic, he reports he is doing well and has no complaints. He is recovering from recent hip surgery. He denies chest pain or shortness of breath. He denies palpitations, dizziness, near syncope or syncope.   He reports compliance with medications.   Past Medical History Past Medical History  Diagnosis Date  . Paroxysmal a-fib ([place])     a. S/P PVI 2/11 and 9/11  . SVT (supraventricular tachycardia) ([place])     a. Atypical AVNRT of the slow pathway  . GERD (gastroesophageal reflux disease)   . Nephrolithiasis   . Dyslipidemia   . Hypertension   . CAD (coronary artery disease)     a. S/P PCI mid LAD (vision stent) 08/31/2004;  b. repeat cath 6/11 - no progression of CAD. c. Cath 10/2012 given moderate CAD on cardiac CT - no change from prior cath.  Marland Kitchen COPD (chronic obstructive pulmonary disease) ([place])   . Hyperthyroidism   . Type II diabetes mellitus ([place])   . HWEXHBZJ(696.7)     "sometimes weekly" (11/08/2012)  . DJD (degenerative joint disease) of lumbar spine   . Arthritis     "in my back and right leg" (11/08/2012)  . Chronic lower back pain   . On home oxygen therapy     "2L q hs" (11/08/2012)  . Palpitations     a. 10/2012 - s/p LINQ loop recorder to assess for arrhythmia.   Marland Kitchen Hypomagnesemia     a. 10/2012 - Mg 0.7.  . Hypokalemia   . Shortness of breath dyspnea     with exertion   . Cancer ([place])     melanoma removed from neck   . OSA (obstructive  sleep apnea)     a. cpap noncompliance- patient reports on 12/10/14- does not use CPAP    Past Surgical History Past Surgical History  Procedure Laterality Date  . Lumbar disc surgery  2000; 2002  . Atrial ablation surgery  2007    S/P slow pathway ablation for atypical AVNRT   . Ep study  2008    Negative EP study in [place]  . Pvi/ cti ablation  02/25/2009 and 09/2009    S/P afib/ CTI ablation  . Tibia fracture surgery Right 1990    "broke in 3 places; had rod put in" (11/08/2012)  . Tibia hardware removal Right ~ 1991  . Coronary angioplasty with stent placement  08/30/2004    Vision; to LAD   . Posterior lumbar fusion  2004  . Tonsillectomy  1965  . Cholecystectomy  ~ 2008  . Fracture surgery    . Prostate surgery  ~ 2009  . Cystoscopy w/ stone manipulation  ~ 1998    "once" (11/08/2012)  . Loop recorder implant  11/10/2012    Medtronic LinQ implanted by Dr Rayann Heman for afib management  . Loop recorder implant N/A 11/10/2012    Procedure: LOOP RECORDER IMPLANT;  Surgeon: Coralyn Mark, MD;  Location: [facility] CATH LAB;  Service:  Cardiovascular;  Laterality: N/A;  . Left heart catheterization with coronary angiogram N/A 11/10/2012    Procedure: LEFT HEART CATHETERIZATION WITH CORONARY ANGIOGRAM;  Surgeon: Burnell Blanks, MD;  Location: [facility] CATH LAB;  Service: Cardiovascular;  Laterality: N/A;  . Colon resection  02/2013   . Total hip arthroplasty Right 12/18/2014    Procedure: RIGHT TOTAL HIP ARTHROPLASTY ANTERIOR APPROACH;  Surgeon: Gaynelle Arabian, MD;  Location: [facility] ORS;  Service: Orthopedics;  Laterality: Right;    Allergies/Intolerances Allergies  Allergen Reactions  . Penicillins Hives and Shortness Of Breath    Has patient had a PCN reaction causing immediate rash, facial/tongue/throat swelling, SOB or lightheadedness with hypotension: Yes Has patient had a PCN reaction causing severe rash involving mucus membranes or skin necrosis: No Has patient had a PCN reaction that  required hospitalization: Yes Has patient had a PCN reaction occurring within the last 10 years: No If all of the above answers are "NO", then may proceed with Cephalosporin use.     Current Home Medications Current Outpatient Prescriptions  Medication Sig Dispense Refill  . albuterol-ipratropium (COMBIVENT) 18-103 MCG/ACT inhaler Inhale 2 puffs into the lungs every 4 (four) hours as needed for wheezing or shortness of breath.     Marland Kitchen amLODipine (NORVASC) 5 MG tablet Take 1 tablet (5 mg total) by mouth daily. 90 tablet 3  . aspirin EC 81 MG EC tablet Take 1 tablet (81 mg total) by mouth daily.    Marland Kitchen atorvastatin (LIPITOR) 40 MG tablet Take 40 mg by mouth at bedtime.     . clonazePAM (KLONOPIN) 1 MG tablet TAKE 1 TABLET BY MOUTH EVERY NIGHT AT BEDTIME  1  . cloNIDine (CATAPRES) 0.2 MG tablet Take 0.2-0.4 mg by mouth 3 (three) times daily. Take 0.89m by mouth in the morning, 0.469mby mouth at lunch, and 0.64m66my mouth at dinner    . cyanocobalamin 1000 MCG tablet Take 1,000 mcg by mouth daily.     . dMarland Kitchencusate sodium (COLACE) 100 MG capsule Take 100 mg by mouth daily.    . fenofibrate 160 MG tablet Take 160 mg by mouth daily.      . fentaNYL (DURAGESIC - DOSED MCG/HR) 25 MCG/HR patch Place 1 patch onto the skin every 3 (three) days.  0  . furosemide (LASIX) 20 MG tablet Take 20 mg by mouth daily as needed for fluid.     . lMarland Kitchensartan (COZAAR) 50 MG tablet Take 100 mg by mouth daily.     . Melatonin 5 MG TABS Take 5 mg by mouth at bedtime.    . metFORMIN (GLUCOPHAGE-XR) 500 MG 24 hr tablet Take 2 tablets (1,000 mg total) by mouth 2 (two) times daily.    . methocarbamol (ROBAXIN) 500 MG tablet Take 1 tablet (500 mg total) by mouth every 6 (six) hours as needed for muscle spasms. 90 tablet 0  . metoprolol succinate (TOPROL-XL) 50 MG 24 hr tablet TAKE 1 TABLET BY MOUTH TWICE A DAY 60 tablet 3  . nitroGLYCERIN (NITROSTAT) 0.4 MG SL tablet Place 0.4 mg under the tongue every 5 (five) minutes as needed for  chest pain. May repeat up to 3 doses.    . nMarland Kitchenstatin (MYCOSTATIN/NYSTOP) 100000 UNIT/GM POWD APPLY 2-3 TIMES DAILY TO AFFECTED AREA(S) as needed for itching.  0  . oxyCODONE (OXY IR/ROXICODONE) 5 MG immediate release tablet Take 1-2 tablets (5-10 mg total) by mouth every 3 (three) hours as needed for moderate pain or severe pain. 90 tablet 0  .  pantoprazole (PROTONIX) 40 MG tablet Take 40 mg by mouth daily.      . sodium chloride (OCEAN) 0.65 % SOLN nasal spray Place 1 spray into both nostrils every 6 (six) hours as needed for congestion.    Marland Kitchen spironolactone (ALDACTONE) 25 MG tablet Take 25 mg by mouth daily.  1  . Tamsulosin HCl (FLOMAX) 0.4 MG CAPS Take 0.4 mg by mouth daily.      . traMADol (ULTRAM) 50 MG tablet Take 1-2 tablets (50-100 mg total) by mouth every 6 (six) hours as needed (mild pain). 80 tablet 1  . warfarin (COUMADIN) 5 MG tablet Take 1-1.5 tablets (5-7.5 mg total) by mouth daily. Take Coumadin for 3 weeks for postoperative, then the patient may resume their previous Coumadin home regimen.  Dose may need to be adjusted based upon the INR.  Follow the INR and titrate Coumadin dose for a therapeutic range between 2.0 and 3.0 INR.  After completing the three weeks of Coumadin, patient may resume their previous Coumadin home regimen.  Takes 23m daily except 7.530mon MWF. 45 tablet 0   No current facility-administered medications for this visit.    Social History Social History   Social History  . Marital Status: Married    Spouse Name: N/A  . Number of Children: N/A  . Years of Education: N/A   Occupational History  . DISABLED   . 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 Smoking status: Former Smoker -- 3.00 packs/day for 38 years    Types: Cigarettes    Quit date: 03/25/2004  . Smokeless tobacco: Former [employer] developer  Types: Chew    Quit date: 04/25/2004  . Alcohol Use: Yes     Comment: 11/08/2012 "quit drinking in 1989"  . Drug Use: No  . Sexual Activity:  Not Currently   Other Topics Concern  . Not on file   Social History Narrative   [place]   Two children and two grandchildren   Attends [employer]   Review of Systems General: No chills, fever, night sweats or weight changes Cardiovascular: No chest pain, dyspnea on exertion, edema, orthopnea, palpitations, paroxysmal nocturnal dyspnea Dermatological: No rash, lesions or masses Respiratory: No cough, dyspnea Urologic: No hematuria, dysuria Abdominal: No nausea, vomiting, diarrhea, bright red blood per rectum, melena, or hematemesis Neurologic: No visual changes, weakness, changes in mental status All other systems reviewed and are otherwise negative except as noted above.  Physical Exam Vitals: Blood pressure 122/92, pulse 87, height 6' (1.829 m), weight 258 lb 6.4 oz (117.209 kg).  General: Well developed, well appearing [age].o. male in no acute distress.  Walking slowly with a cane today HEENT: Normocephalic, atraumatic. EOMs intact. Sclera nonicteric. Oropharynx clear.  Neck: Supple. No JVD. Lungs: Respirations regular and unlabored, CTA bilaterally. No wheezes, rales or rhonchi. Heart: RRR. S1, S2 present. No murmurs, rub, S3 or S4. Abdomen: Soft, non-distended.  Extremities: No clubbing, cyanosis or edema. PT/Radials 2+ and equal bilaterally. Psych: Normal affect. Neuro: Alert and oriented X 3. Moves all extremities spontaneously. Skin: Left chest ILR insertion site intact and well healed.   Device interrogation afib burden 0.3%  Assessment and Plan  1.  Atrial fibrillation Well controlled Continue warfarin for stroke prevention  2. CAD Cardiac cath Oct 2014 revealed stable CAD with patent stent to midLAD No anginal symptoms Continue medical therapy  3. HTN Controlled, no change.  Signed,  Thompson Grayer,  MD 01/01/2015, 3:31 PM

## 2015-01-01 NOTE — Patient Instructions (Signed)
Medication Instructions:  Your physician recommends that you continue on your current medications as directed. Please refer to the Current Medication list given to you today.   Labwork: None ordered   Testing/Procedures: None ordered   Follow-Up: Your physician wants you to follow-up in: 12 months with Dr Rayann Heman Dennis Bast will receive a reminder letter in the mail two months in advance. If you don't receive a letter, please call our office to schedule the follow-up appointment.   Any Other Special Instructions Will Be Listed Below (If Applicable).     If you need a refill on your cardiac medications before your next appointment, please call your pharmacy.

## 2015-01-07 ENCOUNTER — Ambulatory Visit (INDEPENDENT_AMBULATORY_CARE_PROVIDER_SITE_OTHER): Payer: Medicare PPO | Admitting: *Deleted

## 2015-01-07 DIAGNOSIS — I480 Paroxysmal atrial fibrillation: Secondary | ICD-10-CM

## 2015-01-10 NOTE — Progress Notes (Signed)
Carelink Summary Report / Loop Recorder 

## 2015-01-13 LAB — CUP PACEART REMOTE DEVICE CHECK: MDC IDC SESS DTM: 20161113220636

## 2015-02-06 ENCOUNTER — Ambulatory Visit (INDEPENDENT_AMBULATORY_CARE_PROVIDER_SITE_OTHER): Payer: Self-pay | Admitting: *Deleted

## 2015-02-06 DIAGNOSIS — I480 Paroxysmal atrial fibrillation: Secondary | ICD-10-CM

## 2015-02-10 NOTE — Progress Notes (Signed)
Carelink Summary Report / Loop Recorder 

## 2015-02-14 LAB — CUP PACEART REMOTE DEVICE CHECK: MDC IDC SESS DTM: 20161213223715

## 2015-03-10 ENCOUNTER — Ambulatory Visit (INDEPENDENT_AMBULATORY_CARE_PROVIDER_SITE_OTHER): Payer: Self-pay | Admitting: *Deleted

## 2015-03-10 DIAGNOSIS — I480 Paroxysmal atrial fibrillation: Secondary | ICD-10-CM

## 2015-03-10 NOTE — Progress Notes (Signed)
Carelink Summary Report / Loop Recorder 

## 2015-04-02 LAB — CUP PACEART REMOTE DEVICE CHECK: MDC IDC SESS DTM: 20170211233759

## 2015-04-02 NOTE — Progress Notes (Signed)
Carelink summary report received. Battery status OK. Normal device function. No new symptom episodes, tachy episodes, brady, or pause episodes. No new AF episodes. Monthly summary reports and ROV/PRN 

## 2015-04-03 LAB — CUP PACEART REMOTE DEVICE CHECK: MDC IDC SESS DTM: 20170112230908

## 2015-04-03 NOTE — Progress Notes (Signed)
Carelink summary report received. Battery status OK. Normal device function. No new symptom episodes, tachy episodes, brady, or pause episodes. No new AF episodes. Monthly summary reports and ROV/PRN 

## 2015-04-07 ENCOUNTER — Ambulatory Visit (INDEPENDENT_AMBULATORY_CARE_PROVIDER_SITE_OTHER): Payer: Self-pay | Admitting: *Deleted

## 2015-04-07 DIAGNOSIS — I480 Paroxysmal atrial fibrillation: Secondary | ICD-10-CM

## 2015-04-08 NOTE — Progress Notes (Signed)
Carelink Summary Report / Loop Recorder 

## 2015-04-09 DIAGNOSIS — G4700 Insomnia, unspecified: Secondary | ICD-10-CM

## 2015-04-09 DIAGNOSIS — F419 Anxiety disorder, unspecified: Secondary | ICD-10-CM

## 2015-04-09 DIAGNOSIS — Z79899 Other long term (current) drug therapy: Secondary | ICD-10-CM

## 2015-04-09 DIAGNOSIS — M5136 Other intervertebral disc degeneration, lumbar region: Secondary | ICD-10-CM | POA: Insufficient documentation

## 2015-04-09 DIAGNOSIS — M51369 Other intervertebral disc degeneration, lumbar region without mention of lumbar back pain or lower extremity pain: Secondary | ICD-10-CM

## 2015-04-09 HISTORY — DX: Other intervertebral disc degeneration, lumbar region: M51.36

## 2015-04-09 HISTORY — DX: Other long term (current) drug therapy: Z79.899

## 2015-04-09 HISTORY — DX: Insomnia, unspecified: G47.00

## 2015-04-09 HISTORY — DX: Other intervertebral disc degeneration, lumbar region without mention of lumbar back pain or lower extremity pain: M51.369

## 2015-04-09 HISTORY — DX: Anxiety disorder, unspecified: F41.9

## 2015-05-07 ENCOUNTER — Ambulatory Visit (INDEPENDENT_AMBULATORY_CARE_PROVIDER_SITE_OTHER): Payer: Self-pay | Admitting: *Deleted

## 2015-05-07 DIAGNOSIS — I480 Paroxysmal atrial fibrillation: Secondary | ICD-10-CM

## 2015-05-12 NOTE — Progress Notes (Signed)
Carelink Summary Report / Loop Recorder 

## 2015-06-06 ENCOUNTER — Ambulatory Visit (INDEPENDENT_AMBULATORY_CARE_PROVIDER_SITE_OTHER): Payer: Self-pay | Admitting: *Deleted

## 2015-06-06 DIAGNOSIS — I480 Paroxysmal atrial fibrillation: Secondary | ICD-10-CM

## 2015-06-09 NOTE — Progress Notes (Signed)
Carelink Summary Report / Loop Recorder 

## 2015-06-10 ENCOUNTER — Ambulatory Visit (INDEPENDENT_AMBULATORY_CARE_PROVIDER_SITE_OTHER): Payer: Medicare Other | Admitting: Pharmacist

## 2015-06-10 DIAGNOSIS — Z7901 Long term (current) use of anticoagulants: Secondary | ICD-10-CM | POA: Diagnosis not present

## 2015-06-10 DIAGNOSIS — I480 Paroxysmal atrial fibrillation: Secondary | ICD-10-CM

## 2015-06-10 HISTORY — DX: Long term (current) use of anticoagulants: Z79.01

## 2015-06-10 LAB — POCT INR: INR: 1.1

## 2015-06-18 LAB — CUP PACEART REMOTE DEVICE CHECK: MDC IDC SESS DTM: 20170313233755

## 2015-06-22 LAB — CUP PACEART REMOTE DEVICE CHECK: Date Time Interrogation Session: 20170413000725

## 2015-06-22 NOTE — Progress Notes (Signed)
Carelink summary report received. Battery status OK. Normal device function. No new symptom episodes, tachy episodes, brady, or pause episodes. No new AF episodes. Monthly summary reports and ROV/PRN 

## 2015-07-07 ENCOUNTER — Ambulatory Visit (INDEPENDENT_AMBULATORY_CARE_PROVIDER_SITE_OTHER): Payer: Medicare Other | Admitting: *Deleted

## 2015-07-07 DIAGNOSIS — I480 Paroxysmal atrial fibrillation: Secondary | ICD-10-CM

## 2015-07-07 NOTE — Progress Notes (Signed)
Carelink Summary Report / Loop Recorder 

## 2015-07-11 LAB — CUP PACEART REMOTE DEVICE CHECK
Date Time Interrogation Session: 20170612011012
MDC IDC SESS DTM: 20170513003802

## 2015-08-05 ENCOUNTER — Ambulatory Visit (INDEPENDENT_AMBULATORY_CARE_PROVIDER_SITE_OTHER): Payer: Medicare Other | Admitting: *Deleted

## 2015-08-05 DIAGNOSIS — I480 Paroxysmal atrial fibrillation: Secondary | ICD-10-CM

## 2015-08-06 NOTE — Progress Notes (Signed)
Carelink Summary Report / Loop Recorder 

## 2015-08-27 DIAGNOSIS — F330 Major depressive disorder, recurrent, mild: Secondary | ICD-10-CM | POA: Insufficient documentation

## 2015-08-27 HISTORY — DX: Major depressive disorder, recurrent, mild: F33.0

## 2015-09-02 LAB — CUP PACEART REMOTE DEVICE CHECK: MDC IDC SESS DTM: 20170712013912

## 2015-09-04 ENCOUNTER — Ambulatory Visit (INDEPENDENT_AMBULATORY_CARE_PROVIDER_SITE_OTHER): Payer: Medicare Other | Admitting: *Deleted

## 2015-09-04 DIAGNOSIS — I480 Paroxysmal atrial fibrillation: Secondary | ICD-10-CM | POA: Diagnosis not present

## 2015-09-08 NOTE — Progress Notes (Signed)
Carelink Summary Report / Loop Recorder 

## 2015-09-25 LAB — CUP PACEART REMOTE DEVICE CHECK: Date Time Interrogation Session: 20170811021024

## 2015-09-25 NOTE — Progress Notes (Signed)
Carelink summary report received. Battery status OK. Normal device function. No new symptom episodes, brady, or pause episodes. No new AF episodes. 1 tachy- ECG appears atrial in origin. Monthly summary reports and ROV/PRN

## 2015-10-06 ENCOUNTER — Ambulatory Visit (INDEPENDENT_AMBULATORY_CARE_PROVIDER_SITE_OTHER): Payer: Medicare Other | Admitting: *Deleted

## 2015-10-06 DIAGNOSIS — I480 Paroxysmal atrial fibrillation: Secondary | ICD-10-CM | POA: Diagnosis not present

## 2015-10-07 NOTE — Progress Notes (Signed)
Carelink Summary Report / Loop Recorder 

## 2015-11-01 LAB — CUP PACEART REMOTE DEVICE CHECK: MDC IDC SESS DTM: 20170910020810

## 2015-11-01 NOTE — Progress Notes (Signed)
Carelink summary report received. Battery status OK. Normal device function. No new symptom episodes, brady, or pause episodes. No new AF episodes. 1 tachy episode, SVT. Monthly summary reports and ROV/PRN

## 2015-11-03 ENCOUNTER — Ambulatory Visit (INDEPENDENT_AMBULATORY_CARE_PROVIDER_SITE_OTHER): Payer: Medicare Other | Admitting: *Deleted

## 2015-11-03 DIAGNOSIS — I480 Paroxysmal atrial fibrillation: Secondary | ICD-10-CM | POA: Diagnosis not present

## 2015-11-04 NOTE — Progress Notes (Signed)
Carelink Summary Report / Loop Recorder 

## 2015-12-03 ENCOUNTER — Ambulatory Visit (INDEPENDENT_AMBULATORY_CARE_PROVIDER_SITE_OTHER): Payer: Medicare Other | Admitting: *Deleted

## 2015-12-03 DIAGNOSIS — I480 Paroxysmal atrial fibrillation: Secondary | ICD-10-CM

## 2015-12-04 NOTE — Progress Notes (Signed)
Carelink Summary Report / Loop Recorder 

## 2015-12-07 LAB — CUP PACEART REMOTE DEVICE CHECK
Implantable Pulse Generator Implant Date: 20141017
MDC IDC SESS DTM: 20171010030646

## 2015-12-07 NOTE — Progress Notes (Signed)
Carelink summary report received. Battery status OK. Normal device function. No new symptom episodes, tachy episodes, brady, or pause episodes. 3 AF episodes, +Warfarin.  Monthly summary reports and ROV/PRN

## 2015-12-13 ENCOUNTER — Other Ambulatory Visit: Payer: Self-pay | Admitting: Cardiology

## 2016-01-02 ENCOUNTER — Ambulatory Visit (INDEPENDENT_AMBULATORY_CARE_PROVIDER_SITE_OTHER): Payer: Medicare Other | Admitting: *Deleted

## 2016-01-02 DIAGNOSIS — I480 Paroxysmal atrial fibrillation: Secondary | ICD-10-CM | POA: Diagnosis not present

## 2016-01-05 ENCOUNTER — Ambulatory Visit (INDEPENDENT_AMBULATORY_CARE_PROVIDER_SITE_OTHER): Payer: Medicare Other | Admitting: Internal Medicine

## 2016-01-05 ENCOUNTER — Encounter: Payer: Self-pay | Admitting: Internal Medicine

## 2016-01-05 VITALS — BP 102/60 | HR 77 | Ht 72.0 in | Wt 251.8 lb

## 2016-01-05 DIAGNOSIS — I480 Paroxysmal atrial fibrillation: Secondary | ICD-10-CM

## 2016-01-05 DIAGNOSIS — I2510 Atherosclerotic heart disease of native coronary artery without angina pectoris: Secondary | ICD-10-CM | POA: Diagnosis not present

## 2016-01-05 DIAGNOSIS — I10 Essential (primary) hypertension: Secondary | ICD-10-CM

## 2016-01-05 DIAGNOSIS — I2583 Coronary atherosclerosis due to lipid rich plaque: Secondary | ICD-10-CM | POA: Diagnosis not present

## 2016-01-05 LAB — CUP PACEART INCLINIC DEVICE CHECK
MDC IDC PG IMPLANT DT: 20141017
MDC IDC SESS DTM: 20171211151050

## 2016-01-05 NOTE — Progress Notes (Signed)
ELECTROPHYSIOLOGY OFFICE NOTE   Patient ID: BRISTOL SOY MRN: [id number], DOB/AGE: [id number]   Date of Visit: 01/05/2016  Primary Physician: [name], MD Primary Cardiologist: Thompson Grayer, MD  History of Present Illness  Steve Anthony is a 65 y.o. male with atrial fibrillation s/p PVI ablation 2011, PSVT s/p EPS +RF ablation of AVNRT, CAD, COPD and OSA who underwent ILR insertion in Oct 2014 for further evaluation of palpitations.    He presents today for routine electrophysiology followup.  He is doing well.  No CV concerned.  No sustained arrhythmias in the past year.  He denies chest pain or shortness of breath. He denies palpitations, dizziness, near syncope or syncope.   He reports compliance with medications.   Past Medical History Past Medical History:  Diagnosis Date  . Arthritis    "in my back and right leg" (11/08/2012)  . CAD (coronary artery disease)    a. S/P PCI mid LAD (vision stent) 08/31/2004;  b. repeat cath 6/11 - no progression of CAD. c. Cath 10/2012 given moderate CAD on cardiac CT - no change from prior cath.  . Cancer ([place])    melanoma removed from neck   . Chronic lower back pain   . COPD (chronic obstructive pulmonary disease) ([place])   . DJD (degenerative joint disease) of lumbar spine   . Dyslipidemia   . GERD (gastroesophageal reflux disease)   . OINOMVEH(209.4)    "sometimes weekly" (11/08/2012)  . Hypertension   . Hyperthyroidism   . Hypokalemia   . Hypomagnesemia    a. 10/2012 - Mg 0.7.  . Nephrolithiasis   . On home oxygen therapy    "2L q hs" (11/08/2012)  . OSA (obstructive sleep apnea)    a. cpap noncompliance- patient reports on 12/10/14- does not use CPAP  . Palpitations    a. 10/2012 - s/p LINQ loop recorder to assess for arrhythmia.   . Paroxysmal a-fib ([place])    a. S/P PVI 2/11 and 9/11  . Shortness of breath dyspnea    with exertion   . SVT (supraventricular tachycardia) ([place])    a. Atypical AVNRT of the slow pathway  .  Type II diabetes mellitus ([place])     Past Surgical History Past Surgical History:  Procedure Laterality Date  . ATRIAL ABLATION SURGERY  2007   S/P slow pathway ablation for atypical AVNRT   . CHOLECYSTECTOMY  ~ 2008  . COLON RESECTION  02/2013   . CORONARY ANGIOPLASTY WITH STENT PLACEMENT  08/30/2004   Vision; to LAD   . CYSTOSCOPY W/ STONE MANIPULATION  ~ 1998   "once" (11/08/2012)  . EP Study  2008   Negative EP study in [place]  . FRACTURE SURGERY    . LEFT HEART CATHETERIZATION WITH CORONARY ANGIOGRAM N/A 11/10/2012   Procedure: LEFT HEART CATHETERIZATION WITH CORONARY ANGIOGRAM;  Surgeon: Burnell Blanks, MD;  Location: [facility] CATH LAB;  Service: Cardiovascular;  Laterality: N/A;  . LOOP RECORDER IMPLANT  11/10/2012   Medtronic LinQ implanted by Dr Rayann Heman for afib management  . LOOP RECORDER IMPLANT N/A 11/10/2012   Procedure: LOOP RECORDER IMPLANT;  Surgeon: Coralyn Mark, MD;  Location: [place] [facility] CATH LAB;  Service: Cardiovascular;  Laterality: N/A;  . LUMBAR [place] SURGERY  2000; 2002  . POSTERIOR LUMBAR FUSION  2004  . PROSTATE SURGERY  ~ 2009  . PVI/ CTI ablation  02/25/2009 and 09/2009   S/P afib/ CTI ablation  . TIBIA FRACTURE SURGERY Right 1990   "  broke in 3 places; had rod put in" (11/08/2012)  . TIBIA HARDWARE REMOVAL Right ~ 1991  . TONSILLECTOMY  1965  . TOTAL HIP ARTHROPLASTY Right 12/18/2014   Procedure: RIGHT TOTAL HIP ARTHROPLASTY ANTERIOR APPROACH;  Surgeon: Gaynelle Arabian, MD;  Location: [facility] ORS;  Service: Orthopedics;  Laterality: Right;    Allergies/Intolerances Allergies  Allergen Reactions  . Penicillins Hives and Shortness Of Breath    Has patient had a PCN reaction causing immediate rash, facial/tongue/throat swelling, SOB or lightheadedness with hypotension: Yes Has patient had a PCN reaction causing severe rash involving mucus membranes or skin necrosis: No Has patient had a PCN reaction that required hospitalization: Yes Has patient had a PCN reaction  occurring within the last 10 years: No If all of the above answers are "NO", then may proceed with Cephalosporin use.     Current Home Medications Current Outpatient Prescriptions  Medication Sig Dispense Refill  . albuterol-ipratropium (COMBIVENT) 18-103 MCG/ACT inhaler Inhale 2 puffs into the lungs every 4 (four) hours as needed for wheezing or shortness of breath.     Marland Kitchen amLODipine (NORVASC) 5 MG tablet TAKE 1 TABLET (5 MG TOTAL) BY MOUTH DAILY. 90 tablet 0  . aspirin EC 81 MG EC tablet Take 1 tablet (81 mg total) by mouth daily.    Marland Kitchen atorvastatin (LIPITOR) 40 MG tablet Take 40 mg by mouth at bedtime.     . cloNIDine (CATAPRES) 0.2 MG tablet Take 0.2-0.4 mg by mouth 3 (three) times daily. Take 0.62m by mouth in the morning, 0.419mby mouth at lunch, and 0.82m47my mouth at dinner    . cyanocobalamin 1000 MCG tablet Take 1,000 mcg by mouth daily.     . dMarland Kitchencusate sodium (COLACE) 100 MG capsule Take 100 mg by mouth daily.    . fenofibrate 160 MG tablet Take 160 mg by mouth daily.      . fentaNYL (DURAGESIC - DOSED MCG/HR) 25 MCG/HR patch Place 1 patch onto the skin every 3 (three) days.  0  . furosemide (LASIX) 20 MG tablet Take 20 mg by mouth daily as needed for fluid.     . lMarland Kitchensartan (COZAAR) 50 MG tablet Take 100 mg by mouth daily.     . Melatonin 5 MG TABS Take 5 mg by mouth at bedtime.    . metFORMIN (GLUCOPHAGE-XR) 500 MG 24 hr tablet Take 2 tablets (1,000 mg total) by mouth 2 (two) times daily.    . methocarbamol (ROBAXIN) 500 MG tablet Take 1 tablet (500 mg total) by mouth every 6 (six) hours as needed for muscle spasms. 90 tablet 0  . metoprolol succinate (TOPROL-XL) 50 MG 24 hr tablet TAKE 1 TABLET BY MOUTH TWICE A DAY 60 tablet 3  . nitroGLYCERIN (NITROSTAT) 0.4 MG SL tablet Place 0.4 mg under the tongue every 5 (five) minutes as needed for chest pain. May repeat up to 3 doses.    . nMarland Kitchenstatin (MYCOSTATIN/NYSTOP) 100000 UNIT/GM POWD APPLY 2-3 TIMES DAILY TO AFFECTED AREA(S) as needed for  itching.  0  . oxyCODONE (OXY IR/ROXICODONE) 5 MG immediate release tablet Take 1-2 tablets (5-10 mg total) by mouth every 3 (three) hours as needed for moderate pain or severe pain. 90 tablet 0  . pantoprazole (PROTONIX) 40 MG tablet Take 40 mg by mouth daily.      . sodium chloride (OCEAN) 0.65 % SOLN nasal spray Place 1 spray into both nostrils every 6 (six) hours as needed for congestion.    . sMarland Kitchenironolactone (ALDACTONE)  25 MG tablet Take 25 mg by mouth daily.  1  . Tamsulosin HCl (FLOMAX) 0.4 MG CAPS Take 0.4 mg by mouth daily.      . traMADol (ULTRAM) 50 MG tablet Take 1-2 tablets (50-100 mg total) by mouth every 6 (six) hours as needed (mild pain). 80 tablet 1  . warfarin (COUMADIN) 5 MG tablet Take 1-1.5 tablets (5-7.5 mg total) by mouth daily. Take Coumadin for 3 weeks for postoperative, then the patient may resume their previous Coumadin home regimen.  Dose may need to be adjusted based upon the INR.  Follow the INR and titrate Coumadin dose for a therapeutic range between 2.0 and 3.0 INR.  After completing the three weeks of Coumadin, patient may resume their previous Coumadin home regimen.  Takes 71m daily except 7.561mon MWF. 45 tablet 0   No current facility-administered medications for this visit.     Social History Social History   Social History  . Marital status: Married    Spouse name: N/A  . Number of children: N/A  . Years of education: N/A   Occupational History  . DISABLED Disabled  . 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 Smoking status: Former Smoker    Packs/day: 3.00    Years: 38.00    Types: Cigarettes    Quit date: 03/25/2004  . Smokeless tobacco: Former [employer] developer  Types: Chew    Quit date: 04/25/2004  . Alcohol use Yes     Comment: 11/08/2012 "quit drinking in 1989"  . Drug use: No  . Sexual activity: Not Currently   Other Topics Concern  . Not on file   Social History Narrative   [place]   Two children  and two grandchildren   Attends [employer]   Review of Systems See HPI above.  All systems reviewed and otherwise negative  Physical Exam Vitals: Blood pressure 102/60, pulse 77, height 6' (1.829 m), weight 251 lb 12.8 oz (114.2 kg), SpO2 91 %.  General: Well developed, well appearing [age].o. male in no acute distress.  Walking slowly with a cane today HEENT: Normocephalic, atraumatic. EOMs intact. Sclera nonicteric. Oropharynx clear.  Neck: Supple. No JVD. Lungs: Respirations regular and unlabored, CTA bilaterally. No wheezes, rales or rhonchi. Heart: RRR. S1, S2 present. No murmurs, rub, S3 or S4. Abdomen: Soft, non-distended.  Extremities: No clubbing, cyanosis or edema. PT/Radials 2+ and equal bilaterally. Psych: Normal affect. Neuro: Alert and oriented X 3. Moves all extremities spontaneously. Skin: Left chest ILR insertion site intact and well healed.  ILR AF burden <1%, 2 nonsustained SVT events noted, no other episodes  Assessment and Plan  1.  Atrial fibrillation Well controlled Continue warfarin for stroke prevention  2. CAD Cardiac cath Oct 2014 revealed stable CAD with patent stent to midLAD No anginal symptoms Continue medical therapy  3. HTN Controlled, no change.  carelink Return to see EP NP in 1 year unless problems arise  Signed, JaThompson Grayer MD 01/05/2016, 10:39 AM

## 2016-01-05 NOTE — Progress Notes (Signed)
Carelink Summary Report / Loop Recorder 

## 2016-01-05 NOTE — Patient Instructions (Addendum)
Medication Instructions:  Your physician recommends that you continue on your current medications as directed. Please refer to the Current Medication list given to you today.   Labwork: None Ordered   Testing/Procedures: None Ordered   Follow-Up: Your physician wants you to follow-up in: 12 months with Chanetta Marshall, NP. You will receive a reminder letter in the mail two months in advance. If you don't receive a letter, please call our office to schedule the follow-up appointment.    Any Other Special Instructions Will Be Listed Below (If Applicable).     If you need a refill on your cardiac medications before your next appointment, please call your pharmacy.

## 2016-01-17 LAB — CUP PACEART REMOTE DEVICE CHECK
Date Time Interrogation Session: 20171109040750
Implantable Pulse Generator Implant Date: 20141017

## 2016-01-17 NOTE — Progress Notes (Signed)
Carelink summary report received. Battery status OK. Normal device function. No new symptom episodes, tachy episodes, brady, or pause episodes. No new AF episodes. Monthly summary reports and ROV/PRN 

## 2016-02-02 ENCOUNTER — Ambulatory Visit (INDEPENDENT_AMBULATORY_CARE_PROVIDER_SITE_OTHER): Payer: Medicare Other | Admitting: *Deleted

## 2016-02-02 DIAGNOSIS — I480 Paroxysmal atrial fibrillation: Secondary | ICD-10-CM

## 2016-02-03 NOTE — Progress Notes (Signed)
Carelink Summary Report / Loop Recorder 

## 2016-02-05 DIAGNOSIS — K432 Incisional hernia without obstruction or gangrene: Secondary | ICD-10-CM

## 2016-02-05 HISTORY — DX: Incisional hernia without obstruction or gangrene: K43.2

## 2016-02-10 ENCOUNTER — Telehealth: Payer: Self-pay | Admitting: Internal Medicine

## 2016-02-10 NOTE — Telephone Encounter (Signed)
New Message     He is having hernia surgery by Mar 07, 2016 does he have to come back in the office for medical clearance ?

## 2016-02-10 NOTE — Telephone Encounter (Signed)
Spoke with pt and advised since he was seen recently I didn't think he would need to be seen again for clearance.  Asked if office has sent clearance.  Pt states Dr. Lovie Macadamia did not mention needing clearance from our office, just PCP.  Pt will call to clarify and have them send clearance if needed.  Advised pt I would send message to Dr. Jackalyn Lombard nurse, Claiborne Billings and have her call if pt needed to be seen again for any reason.  Pt appreciative for assistance.

## 2016-02-18 NOTE — Telephone Encounter (Signed)
Discussed with Dr Rayann Heman no need for further clearance.  Okay to proceed.  Patient aware

## 2016-02-19 DIAGNOSIS — J449 Chronic obstructive pulmonary disease, unspecified: Secondary | ICD-10-CM

## 2016-02-19 DIAGNOSIS — I4891 Unspecified atrial fibrillation: Secondary | ICD-10-CM | POA: Diagnosis not present

## 2016-02-19 DIAGNOSIS — J181 Lobar pneumonia, unspecified organism: Secondary | ICD-10-CM

## 2016-02-19 DIAGNOSIS — E119 Type 2 diabetes mellitus without complications: Secondary | ICD-10-CM

## 2016-02-19 DIAGNOSIS — J9601 Acute respiratory failure with hypoxia: Secondary | ICD-10-CM | POA: Diagnosis not present

## 2016-02-19 DIAGNOSIS — N179 Acute kidney failure, unspecified: Secondary | ICD-10-CM

## 2016-02-20 DIAGNOSIS — I4891 Unspecified atrial fibrillation: Secondary | ICD-10-CM | POA: Diagnosis not present

## 2016-02-20 DIAGNOSIS — J9601 Acute respiratory failure with hypoxia: Secondary | ICD-10-CM | POA: Diagnosis not present

## 2016-02-20 DIAGNOSIS — N179 Acute kidney failure, unspecified: Secondary | ICD-10-CM | POA: Diagnosis not present

## 2016-02-20 DIAGNOSIS — J181 Lobar pneumonia, unspecified organism: Secondary | ICD-10-CM | POA: Diagnosis not present

## 2016-02-21 DIAGNOSIS — J9601 Acute respiratory failure with hypoxia: Secondary | ICD-10-CM | POA: Diagnosis not present

## 2016-02-21 DIAGNOSIS — E669 Obesity, unspecified: Secondary | ICD-10-CM

## 2016-02-21 DIAGNOSIS — N179 Acute kidney failure, unspecified: Secondary | ICD-10-CM | POA: Diagnosis not present

## 2016-02-21 DIAGNOSIS — J181 Lobar pneumonia, unspecified organism: Secondary | ICD-10-CM | POA: Diagnosis not present

## 2016-02-21 DIAGNOSIS — I4891 Unspecified atrial fibrillation: Secondary | ICD-10-CM | POA: Diagnosis not present

## 2016-02-22 DIAGNOSIS — E876 Hypokalemia: Secondary | ICD-10-CM

## 2016-02-22 DIAGNOSIS — J181 Lobar pneumonia, unspecified organism: Secondary | ICD-10-CM | POA: Diagnosis not present

## 2016-02-22 DIAGNOSIS — N179 Acute kidney failure, unspecified: Secondary | ICD-10-CM | POA: Diagnosis not present

## 2016-02-22 DIAGNOSIS — I4891 Unspecified atrial fibrillation: Secondary | ICD-10-CM | POA: Diagnosis not present

## 2016-02-22 DIAGNOSIS — J9601 Acute respiratory failure with hypoxia: Secondary | ICD-10-CM | POA: Diagnosis not present

## 2016-03-03 ENCOUNTER — Ambulatory Visit (INDEPENDENT_AMBULATORY_CARE_PROVIDER_SITE_OTHER): Payer: Medicare Other | Admitting: *Deleted

## 2016-03-03 DIAGNOSIS — I480 Paroxysmal atrial fibrillation: Secondary | ICD-10-CM

## 2016-03-03 NOTE — Progress Notes (Signed)
Carelink Summary Report / Loop Recorder 

## 2016-03-14 ENCOUNTER — Other Ambulatory Visit: Payer: Self-pay | Admitting: Cardiology

## 2016-03-16 LAB — CUP PACEART REMOTE DEVICE CHECK
Date Time Interrogation Session: 20180108053841
MDC IDC PG IMPLANT DT: 20141017

## 2016-03-28 LAB — CUP PACEART REMOTE DEVICE CHECK
Date Time Interrogation Session: 20180207053940
MDC IDC PG IMPLANT DT: 20141017

## 2016-03-28 NOTE — Progress Notes (Signed)
Carelink summary report received. Battery status OK. Normal device function. No new symptom episodes, tachy episodes, brady, or pause episodes. No new AF episodes. Monthly summary reports and ROV/PRN 

## 2016-04-02 ENCOUNTER — Ambulatory Visit (INDEPENDENT_AMBULATORY_CARE_PROVIDER_SITE_OTHER): Payer: Medicare Other | Admitting: *Deleted

## 2016-04-02 DIAGNOSIS — I480 Paroxysmal atrial fibrillation: Secondary | ICD-10-CM

## 2016-04-02 NOTE — Progress Notes (Signed)
Carelink Summary Report / Loop Recorder 

## 2016-04-07 ENCOUNTER — Telehealth: Payer: Self-pay | Admitting: *Deleted

## 2016-04-07 NOTE — Telephone Encounter (Signed)
LVM to discuss LINQ at RRT since April 02, 2016.

## 2016-04-07 NOTE — Telephone Encounter (Signed)
I have not received referral information on this patient so I called them and requested again.

## 2016-04-07 NOTE — Telephone Encounter (Signed)
Pt called back and we have scheduled an appt with our coumadin clinic on Wednesday March 21st and he states understanding

## 2016-04-07 NOTE — Telephone Encounter (Signed)
Received call from [facility] in [place], they are calling for referral of this patient with unstable INRs, Dr Rayann Heman is his cardiologist, have requested referral information along with INR readings and dose of coumadin be faxed to [place].

## 2016-04-07 NOTE — Telephone Encounter (Signed)
[facility] again regarding not receiving the referral for this patient to start coming here for his coumadin management. We have not received any information via fax for referral.

## 2016-04-07 NOTE — Telephone Encounter (Signed)
We have received Mr [facility] records from [place] and his INR on March 13th was 1.72 and his dose of coumadin is 7.75m every day Need recheck on March 21st  Called and left message for pt to call [place] can set up an appt in our clinic

## 2016-04-08 LAB — CUP PACEART REMOTE DEVICE CHECK
MDC IDC PG IMPLANT DT: 20141017
MDC IDC SESS DTM: 20180309063928

## 2016-04-08 NOTE — Telephone Encounter (Signed)
Patient returned call. Advised patient about LINQ at RRT since April 02, 2016. Discussed the next step and options of explant vs. leaving it in. He stated Dr. Rayann Heman had discussed his options for when the battery triggered RRT, at [place] 12/2015 and after discussing it with his family he states he declines to have  The LINQ explanted. He states he will call us if he feels his heart fluttering again and he will see [place] at his annual f/u 12/2016. Patient was appreciative.   Advised patient we will be sending a return kit to his home for his Carelink monitor. He verbalizes understanding.

## 2016-04-14 ENCOUNTER — Telehealth: Payer: Self-pay | Admitting: Internal Medicine

## 2016-04-14 ENCOUNTER — Encounter (INDEPENDENT_AMBULATORY_CARE_PROVIDER_SITE_OTHER): Payer: Self-pay

## 2016-04-14 ENCOUNTER — Ambulatory Visit (INDEPENDENT_AMBULATORY_CARE_PROVIDER_SITE_OTHER): Payer: Medicare Other | Admitting: *Deleted

## 2016-04-14 DIAGNOSIS — I480 Paroxysmal atrial fibrillation: Secondary | ICD-10-CM

## 2016-04-14 LAB — POCT INR: INR: 1.9

## 2016-04-14 MED ORDER — WARFARIN SODIUM 5 MG PO TABS: ORAL_TABLET | ORAL | 1 refills | Status: DC

## 2016-04-14 NOTE — Telephone Encounter (Signed)
New Message:    Steve Anthony from [facility], wanted to know if Dr Rayann Heman was aware of the interaction between Warfarin and Fenofibrate?

## 2016-04-14 NOTE — Telephone Encounter (Signed)
Not a severe interaction and we are actively monitoring INR, fine to fill both. Pt has been taking both meds for a while. Called pharmacy back to let them know this.

## 2016-04-14 NOTE — Patient Instructions (Signed)

## 2016-04-20 ENCOUNTER — Other Ambulatory Visit: Payer: Self-pay | Admitting: Internal Medicine

## 2016-04-21 ENCOUNTER — Ambulatory Visit (INDEPENDENT_AMBULATORY_CARE_PROVIDER_SITE_OTHER): Payer: Medicare Other | Admitting: *Deleted

## 2016-04-21 DIAGNOSIS — I480 Paroxysmal atrial fibrillation: Secondary | ICD-10-CM

## 2016-04-21 LAB — POCT INR: INR: 2.6

## 2016-04-27 ENCOUNTER — Ambulatory Visit (INDEPENDENT_AMBULATORY_CARE_PROVIDER_SITE_OTHER): Payer: Medicare Other | Admitting: *Deleted

## 2016-04-27 DIAGNOSIS — I480 Paroxysmal atrial fibrillation: Secondary | ICD-10-CM

## 2016-04-27 LAB — POCT INR: INR: 1.8

## 2016-05-03 ENCOUNTER — Encounter: Payer: Medicare Other | Admitting: *Deleted

## 2016-05-07 ENCOUNTER — Ambulatory Visit (INDEPENDENT_AMBULATORY_CARE_PROVIDER_SITE_OTHER): Payer: Medicare Other

## 2016-05-07 DIAGNOSIS — I480 Paroxysmal atrial fibrillation: Secondary | ICD-10-CM

## 2016-05-07 LAB — POCT INR: INR: 2.8

## 2016-05-20 ENCOUNTER — Ambulatory Visit (INDEPENDENT_AMBULATORY_CARE_PROVIDER_SITE_OTHER): Payer: Medicare Other | Admitting: *Deleted

## 2016-05-20 DIAGNOSIS — I480 Paroxysmal atrial fibrillation: Secondary | ICD-10-CM

## 2016-05-20 DIAGNOSIS — Z7901 Long term (current) use of anticoagulants: Secondary | ICD-10-CM

## 2016-05-20 LAB — POCT INR: INR: 2.7

## 2016-06-04 ENCOUNTER — Other Ambulatory Visit: Payer: Self-pay | Admitting: Internal Medicine

## 2016-06-07 ENCOUNTER — Telehealth: Payer: Self-pay | Admitting: Internal Medicine

## 2016-06-07 NOTE — Telephone Encounter (Signed)
New Message  [place] voiced needing ejection fraction and they sent fax but we did not include EF which was part of what was requested.  Please f/u

## 2016-06-10 ENCOUNTER — Ambulatory Visit (INDEPENDENT_AMBULATORY_CARE_PROVIDER_SITE_OTHER): Payer: Medicare Other | Admitting: *Deleted

## 2016-06-10 DIAGNOSIS — I480 Paroxysmal atrial fibrillation: Secondary | ICD-10-CM

## 2016-06-10 DIAGNOSIS — Z7901 Long term (current) use of anticoagulants: Secondary | ICD-10-CM

## 2016-06-10 LAB — POCT INR: INR: 3.9

## 2016-06-11 NOTE — Telephone Encounter (Signed)
Called and left a message for [facility] EF and date test was done.

## 2016-06-12 ENCOUNTER — Other Ambulatory Visit: Payer: Self-pay | Admitting: Cardiology

## 2016-06-12 ENCOUNTER — Other Ambulatory Visit: Payer: Self-pay | Admitting: Internal Medicine

## 2016-06-24 ENCOUNTER — Ambulatory Visit (INDEPENDENT_AMBULATORY_CARE_PROVIDER_SITE_OTHER): Payer: Medicare Other | Admitting: Pharmacist

## 2016-06-24 DIAGNOSIS — I480 Paroxysmal atrial fibrillation: Secondary | ICD-10-CM

## 2016-06-24 DIAGNOSIS — Z7901 Long term (current) use of anticoagulants: Secondary | ICD-10-CM | POA: Diagnosis not present

## 2016-06-24 LAB — POCT INR: INR: 3.1

## 2016-07-08 ENCOUNTER — Ambulatory Visit (INDEPENDENT_AMBULATORY_CARE_PROVIDER_SITE_OTHER): Payer: Medicare Other | Admitting: *Deleted

## 2016-07-08 DIAGNOSIS — Z7901 Long term (current) use of anticoagulants: Secondary | ICD-10-CM

## 2016-07-08 DIAGNOSIS — I480 Paroxysmal atrial fibrillation: Secondary | ICD-10-CM | POA: Diagnosis not present

## 2016-07-08 LAB — POCT INR: INR: 4.6

## 2016-07-22 ENCOUNTER — Ambulatory Visit (INDEPENDENT_AMBULATORY_CARE_PROVIDER_SITE_OTHER): Payer: Medicare Other | Admitting: Pharmacist

## 2016-07-22 DIAGNOSIS — I480 Paroxysmal atrial fibrillation: Secondary | ICD-10-CM

## 2016-07-22 DIAGNOSIS — Z7901 Long term (current) use of anticoagulants: Secondary | ICD-10-CM

## 2016-07-22 LAB — POCT INR: INR: 4.2

## 2016-08-09 ENCOUNTER — Ambulatory Visit (INDEPENDENT_AMBULATORY_CARE_PROVIDER_SITE_OTHER): Payer: Medicare Other

## 2016-08-09 DIAGNOSIS — Z7901 Long term (current) use of anticoagulants: Secondary | ICD-10-CM

## 2016-08-09 DIAGNOSIS — I480 Paroxysmal atrial fibrillation: Secondary | ICD-10-CM | POA: Diagnosis not present

## 2016-08-09 LAB — POCT INR: INR: 4.5

## 2016-08-23 DIAGNOSIS — L818 Other specified disorders of pigmentation: Secondary | ICD-10-CM | POA: Insufficient documentation

## 2016-08-23 HISTORY — DX: Other specified disorders of pigmentation: L81.8

## 2016-08-24 ENCOUNTER — Ambulatory Visit (INDEPENDENT_AMBULATORY_CARE_PROVIDER_SITE_OTHER): Payer: Medicare Other | Admitting: *Deleted

## 2016-08-24 DIAGNOSIS — Z7901 Long term (current) use of anticoagulants: Secondary | ICD-10-CM | POA: Diagnosis not present

## 2016-08-24 DIAGNOSIS — I480 Paroxysmal atrial fibrillation: Secondary | ICD-10-CM

## 2016-08-24 LAB — POCT INR: INR: 2.2

## 2016-09-07 ENCOUNTER — Ambulatory Visit (INDEPENDENT_AMBULATORY_CARE_PROVIDER_SITE_OTHER): Payer: Medicare Other | Admitting: Pharmacist

## 2016-09-07 DIAGNOSIS — I480 Paroxysmal atrial fibrillation: Secondary | ICD-10-CM

## 2016-09-07 DIAGNOSIS — Z7901 Long term (current) use of anticoagulants: Secondary | ICD-10-CM

## 2016-09-07 LAB — POCT INR: INR: 2.9

## 2016-09-28 ENCOUNTER — Ambulatory Visit (INDEPENDENT_AMBULATORY_CARE_PROVIDER_SITE_OTHER): Payer: Medicare Other

## 2016-09-28 DIAGNOSIS — Z7901 Long term (current) use of anticoagulants: Secondary | ICD-10-CM | POA: Diagnosis not present

## 2016-09-28 DIAGNOSIS — I480 Paroxysmal atrial fibrillation: Secondary | ICD-10-CM

## 2016-09-28 LAB — POCT INR: INR: 3

## 2016-10-25 ENCOUNTER — Ambulatory Visit (INDEPENDENT_AMBULATORY_CARE_PROVIDER_SITE_OTHER): Payer: Medicare Other | Admitting: *Deleted

## 2016-10-25 DIAGNOSIS — I480 Paroxysmal atrial fibrillation: Secondary | ICD-10-CM | POA: Diagnosis not present

## 2016-10-25 DIAGNOSIS — Z7901 Long term (current) use of anticoagulants: Secondary | ICD-10-CM

## 2016-10-25 LAB — POCT INR: INR: 4.6

## 2016-11-08 ENCOUNTER — Ambulatory Visit (INDEPENDENT_AMBULATORY_CARE_PROVIDER_SITE_OTHER): Payer: Medicare Other

## 2016-11-08 DIAGNOSIS — I480 Paroxysmal atrial fibrillation: Secondary | ICD-10-CM

## 2016-11-08 DIAGNOSIS — Z7901 Long term (current) use of anticoagulants: Secondary | ICD-10-CM

## 2016-11-08 LAB — POCT INR: INR: 3.2

## 2016-11-22 ENCOUNTER — Ambulatory Visit (INDEPENDENT_AMBULATORY_CARE_PROVIDER_SITE_OTHER): Payer: Medicare Other | Admitting: *Deleted

## 2016-11-22 DIAGNOSIS — Z7901 Long term (current) use of anticoagulants: Secondary | ICD-10-CM | POA: Diagnosis not present

## 2016-11-22 DIAGNOSIS — I480 Paroxysmal atrial fibrillation: Secondary | ICD-10-CM

## 2016-11-22 LAB — POCT INR: INR: 2.6

## 2016-12-11 ENCOUNTER — Other Ambulatory Visit: Payer: Self-pay | Admitting: Cardiology

## 2016-12-13 ENCOUNTER — Ambulatory Visit (INDEPENDENT_AMBULATORY_CARE_PROVIDER_SITE_OTHER): Payer: Medicare Other | Admitting: *Deleted

## 2016-12-13 DIAGNOSIS — I480 Paroxysmal atrial fibrillation: Secondary | ICD-10-CM | POA: Diagnosis not present

## 2016-12-13 DIAGNOSIS — Z7901 Long term (current) use of anticoagulants: Secondary | ICD-10-CM

## 2016-12-13 LAB — POCT INR: INR: 2.3

## 2016-12-13 NOTE — Patient Instructions (Addendum)
Continue taking same dosage 1 tablet daily except 1.5 tablets on Sundays, Tuesdays and Thursdays.  Recheck INR in 4 weeks with MD appt.   Main [phone] Coumadin Clinic [phone]

## 2016-12-16 ENCOUNTER — Observation Stay (HOSPITAL_COMMUNITY)
Admission: AD | Admit: 2016-12-16 | Discharge: 2016-12-17 | Disposition: A | Discharge status: Home / Self Care | Payer: Medicare Other | Source: Other Acute Inpatient Hospital | Attending: Internal Medicine | Admitting: Internal Medicine

## 2016-12-16 DIAGNOSIS — I10 Essential (primary) hypertension: Secondary | ICD-10-CM | POA: Diagnosis not present

## 2016-12-16 DIAGNOSIS — I4891 Unspecified atrial fibrillation: Secondary | ICD-10-CM | POA: Diagnosis present

## 2016-12-16 DIAGNOSIS — Z9049 Acquired absence of other specified parts of digestive tract: Secondary | ICD-10-CM | POA: Insufficient documentation

## 2016-12-16 DIAGNOSIS — Z87891 Personal history of nicotine dependence: Secondary | ICD-10-CM | POA: Insufficient documentation

## 2016-12-16 DIAGNOSIS — K219 Gastro-esophageal reflux disease without esophagitis: Secondary | ICD-10-CM | POA: Diagnosis present

## 2016-12-16 DIAGNOSIS — Z85828 Personal history of other malignant neoplasm of skin: Secondary | ICD-10-CM | POA: Insufficient documentation

## 2016-12-16 DIAGNOSIS — I2510 Atherosclerotic heart disease of native coronary artery without angina pectoris: Secondary | ICD-10-CM | POA: Insufficient documentation

## 2016-12-16 DIAGNOSIS — E119 Type 2 diabetes mellitus without complications: Secondary | ICD-10-CM

## 2016-12-16 DIAGNOSIS — J449 Chronic obstructive pulmonary disease, unspecified: Secondary | ICD-10-CM | POA: Insufficient documentation

## 2016-12-16 DIAGNOSIS — Z96641 Presence of right artificial hip joint: Secondary | ICD-10-CM | POA: Insufficient documentation

## 2016-12-16 DIAGNOSIS — Z955 Presence of coronary angioplasty implant and graft: Secondary | ICD-10-CM

## 2016-12-16 DIAGNOSIS — I200 Unstable angina: Secondary | ICD-10-CM | POA: Diagnosis present

## 2016-12-16 DIAGNOSIS — E7800 Pure hypercholesterolemia, unspecified: Secondary | ICD-10-CM

## 2016-12-16 DIAGNOSIS — E0590 Thyrotoxicosis, unspecified without thyrotoxic crisis or storm: Secondary | ICD-10-CM | POA: Diagnosis not present

## 2016-12-16 DIAGNOSIS — R079 Chest pain, unspecified: Principal | ICD-10-CM

## 2016-12-16 DIAGNOSIS — I4819 Other persistent atrial fibrillation: Secondary | ICD-10-CM | POA: Diagnosis present

## 2016-12-16 DIAGNOSIS — I4821 Permanent atrial fibrillation: Secondary | ICD-10-CM | POA: Diagnosis present

## 2016-12-16 DIAGNOSIS — G4730 Sleep apnea, unspecified: Secondary | ICD-10-CM | POA: Diagnosis present

## 2016-12-16 DIAGNOSIS — I208 Other forms of angina pectoris: Secondary | ICD-10-CM | POA: Diagnosis not present

## 2016-12-16 HISTORY — DX: Chest pain, unspecified: R07.9

## 2016-12-16 LAB — GLUCOSE, CAPILLARY: GLUCOSE-CAPILLARY: 250 mg/dL — AB (ref 65–99)

## 2016-12-16 LAB — TROPONIN I

## 2016-12-16 MED ORDER — AMLODIPINE BESYLATE 5 MG PO TABS
5.0000 mg | ORAL_TABLET | Freq: Every day | ORAL | Status: DC
  Administered 2016-12-17: 5 mg via ORAL
  Filled 2016-12-16: qty 1

## 2016-12-16 MED ORDER — ONDANSETRON HCL 4 MG/2ML IJ SOLN
4.0000 mg | Freq: Four times a day (QID) | INTRAMUSCULAR | Status: DC | PRN
Start: 2016-12-16 — End: 2016-12-17

## 2016-12-16 MED ORDER — ATORVASTATIN CALCIUM 40 MG PO TABS
40.0000 mg | ORAL_TABLET | Freq: Every day | ORAL | Status: DC
  Administered 2016-12-16: 40 mg via ORAL
  Filled 2016-12-16: qty 1

## 2016-12-16 MED ORDER — INSULIN ASPART 100 UNIT/ML ~~LOC~~ SOLN: 0.0000 [IU] | SUBCUTANEOUS | Status: DC

## 2016-12-16 MED ORDER — NITROGLYCERIN 0.4 MG SL SUBL: 0.4000 mg | SUBLINGUAL_TABLET | SUBLINGUAL | Status: DC | PRN

## 2016-12-16 MED ORDER — PANTOPRAZOLE SODIUM 40 MG PO TBEC
40.0000 mg | DELAYED_RELEASE_TABLET | Freq: Every day | ORAL | Status: DC
  Administered 2016-12-17: 40 mg via ORAL
  Filled 2016-12-16: qty 1

## 2016-12-16 MED ORDER — ORAL CARE MOUTH RINSE: 15.0000 mL | Freq: Two times a day (BID) | OROMUCOSAL | Status: DC

## 2016-12-16 MED ORDER — OXYCODONE HCL 5 MG PO TABS
5.0000 mg | ORAL_TABLET | ORAL | Status: DC | PRN
  Administered 2016-12-16 – 2016-12-17 (×4): 10 mg via ORAL
  Filled 2016-12-16 (×4): qty 2

## 2016-12-16 MED ORDER — TAMSULOSIN HCL 0.4 MG PO CAPS
0.4000 mg | ORAL_CAPSULE | Freq: Every day | ORAL | Status: DC
  Administered 2016-12-17: 0.4 mg via ORAL
  Filled 2016-12-16: qty 1

## 2016-12-16 MED ORDER — ACETAMINOPHEN 325 MG PO TABS
650.0000 mg | ORAL_TABLET | ORAL | Status: DC | PRN
  Administered 2016-12-17: 650 mg via ORAL
  Filled 2016-12-16 (×2): qty 2

## 2016-12-16 MED ORDER — METOPROLOL SUCCINATE ER 50 MG PO TB24
50.0000 mg | ORAL_TABLET | Freq: Two times a day (BID) | ORAL | Status: DC
  Administered 2016-12-17: 50 mg via ORAL
  Filled 2016-12-16: qty 1

## 2016-12-16 MED ORDER — IPRATROPIUM-ALBUTEROL 0.5-2.5 (3) MG/3ML IN SOLN: 3.0000 mL | RESPIRATORY_TRACT | Status: DC | PRN

## 2016-12-16 MED ORDER — ASPIRIN EC 81 MG PO TBEC
81.0000 mg | DELAYED_RELEASE_TABLET | Freq: Every day | ORAL | Status: DC
  Administered 2016-12-17: 81 mg via ORAL
  Filled 2016-12-16: qty 1

## 2016-12-16 MED ORDER — FENOFIBRATE 160 MG PO TABS
160.0000 mg | ORAL_TABLET | Freq: Every day | ORAL | Status: DC
Start: 2016-12-17 — End: 2016-12-17
  Administered 2016-12-17: 160 mg via ORAL
  Filled 2016-12-16: qty 1

## 2016-12-16 NOTE — H&P (Signed)
History & Physical    Patient ID: Steve Anthony MRN: [id number], DOB/AGE: [birth year]   Admit date: 12/16/2016   Primary Physician: [name], MD Primary Cardiologist: Allred  Patient Profile    66 yo male with PMH of CAD, PAF s/p ablation, GERD, hypothyroidism, HTN, DM, HL, and OSA on Cpap who presented to [facility] with chest pain.   Past Medical History   Past Medical History:  Diagnosis Date  . Arthritis    "in my back and right leg" (11/08/2012)  . CAD (coronary artery disease)    a. S/P PCI mid LAD (vision stent) 08/31/2004;  b. repeat cath 6/11 - no progression of CAD. c. Cath 10/2012 given moderate CAD on cardiac CT - no change from prior cath.  . Cancer ([place])    melanoma removed from neck   . Chronic lower back pain   . COPD (chronic obstructive pulmonary disease) ([place])   . DJD (degenerative joint disease) of lumbar spine   . Dyslipidemia   . GERD (gastroesophageal reflux disease)   . GGEZMOQH(476.5)    "sometimes weekly" (11/08/2012)  . Hypertension   . Hyperthyroidism   . Hypokalemia   . Hypomagnesemia    a. 10/2012 - Mg 0.7.  . Nephrolithiasis   . On home oxygen therapy    "2L q hs" (11/08/2012)  . OSA (obstructive sleep apnea)    a. cpap noncompliance- patient reports on 12/10/14- does not use CPAP  . Palpitations    a. 10/2012 - s/p LINQ loop recorder to assess for arrhythmia.   . Paroxysmal a-fib ([place])    a. S/P PVI 2/11 and 9/11  . Shortness of breath dyspnea    with exertion   . SVT (supraventricular tachycardia) ([place])    a. Atypical AVNRT of the slow pathway  . Type II diabetes mellitus ([place])     Past Surgical History:  Procedure Laterality Date  . ATRIAL ABLATION SURGERY  2007   S/P slow pathway ablation for atypical AVNRT   . CHOLECYSTECTOMY  ~ 2008  . COLON RESECTION  02/2013   . CORONARY ANGIOPLASTY WITH STENT PLACEMENT  08/30/2004   Vision; to LAD   . CYSTOSCOPY W/ STONE MANIPULATION  ~ 1998   "once" (11/08/2012)  . EP Study   2008   Negative EP study in [place]  . FRACTURE SURGERY    . LEFT HEART CATHETERIZATION WITH CORONARY ANGIOGRAM N/A 11/10/2012   Procedure: LEFT HEART CATHETERIZATION WITH CORONARY ANGIOGRAM;  Surgeon: Burnell Blanks, MD;  Location: [facility] CATH LAB;  Service: Cardiovascular;  Laterality: N/A;  . LOOP RECORDER IMPLANT  11/10/2012   Medtronic LinQ implanted by Dr Rayann Heman for afib management  . LOOP RECORDER IMPLANT N/A 11/10/2012   Procedure: LOOP RECORDER IMPLANT;  Surgeon: Coralyn Mark, MD;  Location: [place] CATH LAB;  Service: Cardiovascular;  Laterality: N/A;  . LUMBAR [place] SURGERY  2000; 2002  . POSTERIOR LUMBAR FUSION  2004  . PROSTATE SURGERY  ~ 2009  . PVI/ CTI ablation  02/25/2009 and 09/2009   S/P afib/ CTI ablation  . TIBIA FRACTURE SURGERY Right 1990   "broke in 3 places; had rod put in" (11/08/2012)  . TIBIA HARDWARE REMOVAL Right ~ 1991  . TONSILLECTOMY  1965  . TOTAL HIP ARTHROPLASTY Right 12/18/2014   Procedure: RIGHT TOTAL HIP ARTHROPLASTY ANTERIOR APPROACH;  Surgeon: Gaynelle Arabian, MD;  Location: [facility] ORS;  Service: Orthopedics;  Laterality: Right;     Allergies  Allergies  Allergen Reactions  .  Penicillins Hives and Shortness Of Breath    Has patient had a PCN reaction causing immediate rash, facial/tongue/throat swelling, SOB or lightheadedness with hypotension: Yes Has patient had a PCN reaction causing severe rash involving mucus membranes or skin necrosis: No Has patient had a PCN reaction that required hospitalization: Yes Has patient had a PCN reaction occurring within the last 10 years: No If all of the above answers are "NO", then may proceed with Cephalosporin use.     History of Present Illness    Steve Anthony is a 66 male with PMH of CAD, PAF s/p ablation, GERD, hypothyroidism, HTN, DM, HL, and OSA on Cpap. He had a cath in 2006 with stent to the LAD, and repeat cath in 2011 that showed patent stent with moderate disease below previously placed stent. Dx  with PAF in 2014 and placed on coumadin. Has underwent several ablations since that time and followed by Dr. Rayann Heman. He had a coronary CTA in 2014 that report 40-50% lesion in the mid LAD. Last echo in 2014 showed normal EF.   He reports being in his usual state of health up until this morning. Felt hot and "sweaty" this morning and weak. When to his daughter's house for Thanksgiving. Around 2pm had sudden onset of chest pain, become short or breath, diaphoretic, and nauseated. Family stated he looked gray. Took blood pressure and was 90 systolic. Presented to [facility].  Labs showed negative troponin x1, stable electrolytes with Cr 1.8. EKG showed SR with q waves in lead III similar to previous EKGs. CXR negative. Reports he was given 1 SL nitro with relief of chest pain, but blood pressure became soft in the 90s. Given IVF with improvement. He was transferred here for further care.   Home Medications    Prior to Admission medications   Medication Sig Start Date End Date Taking? Authorizing Provider  albuterol-ipratropium (COMBIVENT) 18-103 MCG/ACT inhaler Inhale 2 puffs into the lungs every 4 (four) hours as needed for wheezing or shortness of breath.     [provider]  amLODipine (NORVASC) 5 MG tablet TAKE 1 TABLET (5 MG TOTAL) BY MOUTH DAILY. 12/13/16   Isaiah Serge, NP  aspirin EC 81 MG EC tablet Take 1 tablet (81 mg total) by mouth daily. 11/10/12   Dunn, Nedra Hai, PA-C  atorvastatin (LIPITOR) 40 MG tablet Take 40 mg by mouth at bedtime.     [provider]  cyanocobalamin 1000 MCG tablet Take 1,000 mcg by mouth daily.     [provider]  docusate sodium (COLACE) 100 MG capsule Take 100 mg by mouth daily.    [provider]  fenofibrate 160 MG tablet Take 160 mg by mouth daily.      [provider]  fentaNYL (DURAGESIC - DOSED MCG/HR) 25 MCG/HR patch Place 1 patch onto the skin every 3 (three) days. 11/22/13   [provider]    furosemide (LASIX) 20 MG tablet Take 20 mg by mouth daily as needed for fluid.     [provider]  losartan (COZAAR) 50 MG tablet Take 100 mg by mouth daily.     [provider]  Melatonin 5 MG TABS Take 5 mg by mouth at bedtime.    [provider]  metFORMIN (GLUCOPHAGE-XR) 500 MG 24 hr tablet Take 2 tablets (1,000 mg total) by mouth 2 (two) times daily. 11/13/12   Dunn, Nedra Hai, PA-C  methocarbamol (ROBAXIN) 500 MG tablet Take 1 tablet (500 mg  total) by mouth every 6 (six) hours as needed for muscle spasms. 12/20/14   Perkins, Alexzandrew L, PA-C  metoprolol succinate (TOPROL-XL) 50 MG 24 hr tablet TAKE 1 TABLET BY MOUTH TWICE A DAY    Allred, James, MD  nitroGLYCERIN (NITROSTAT) 0.4 MG SL tablet Place 0.4 mg under the tongue every 5 (five) minutes as needed for chest pain. May repeat up to 3 doses.    [provider]  nystatin (MYCOSTATIN/NYSTOP) 100000 UNIT/GM POWD APPLY 2-3 TIMES DAILY TO AFFECTED AREA(S) as needed for itching. 10/31/14   [provider]  oxyCODONE (OXY IR/ROXICODONE) 5 MG immediate release tablet Take 1-2 tablets (5-10 mg total) by mouth every 3 (three) hours as needed for moderate pain or severe pain. 12/20/14   Perkins, Alexzandrew L, PA-C  pantoprazole (PROTONIX) 40 MG tablet Take 40 mg by mouth daily.      [provider]  sodium chloride (OCEAN) 0.65 % SOLN nasal spray Place 1 spray into both nostrils every 6 (six) hours as needed for congestion.    [provider]  spironolactone (ALDACTONE) 25 MG tablet Take 25 mg by mouth daily. 10/26/14   [provider]  Tamsulosin HCl (FLOMAX) 0.4 MG CAPS Take 0.4 mg by mouth daily.      [provider]  traMADol (ULTRAM) 50 MG tablet Take 1-2 tablets (50-100 mg total) by mouth every 6 (six) hours as needed (mild pain). 12/20/14   Perkins, Alexzandrew L, PA-C  warfarin (COUMADIN) 5 MG tablet Take as directed by [facility] 06/14/16   Thompson Grayer, MD     Family History    Family History  Problem Relation Age of Onset  . Emphysema Mother   . Heart attack Father        CVA  . Hypertension Father   . Diabetes Father   . Heart disease Father        MI  . Emphysema Maternal Grandfather        Smoker  . Heart disease Paternal Grandfather     Social History    Social History   Socioeconomic History  . Marital status: Married    Spouse name: Not on file  . Number of children: Not on file  . Years of education: Not on file  . Highest education level: Not on file  Social Needs  . Financial resource strain: Not on file  . Food insecurity - worry: Not on file  . Food insecurity - inability: Not on file  . Transportation needs - medical: Not on file  . Transportation needs - non-medical: Not on file  Occupational History  . Occupation: DISABLED    Employer: DISABLED  . 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 Smoking status: Former Smoker    Packs/day: 3.00    Years: 38.00    Pack years: 114.00    Types: Cigarettes    Last attempt to quit: 03/25/2004    Years since quitting: 12.7  . Smokeless tobacco: Former Systems developer    Types: [place] date: 04/25/2004  Substance and Sexual Activity  . Alcohol use: Yes    Comment: 11/08/2012 "quit drinking in 1989"  . Drug use: No  . Sexual activity: Not Currently  Other Topics Concern  . Not on file  Social History Narrative   Resides in [place] with his wife   Two children and two grandchildren   Attends [employer]     Review of Systems  See HPI  All other systems reviewed and are otherwise negative except as noted above.  Physical Exam    Blood pressure 136/87, pulse 74, temperature (!) 97.5 F (36.4 C), temperature source Oral, height 6' (1.829 m), weight 282 lb 4.8 oz (128.1 kg), SpO2 97 %.  General: Pleasant, older WM, NAD wearing [place]. Psych: Normal affect. Neuro: Alert and oriented X 3. Moves all extremities spontaneously. HEENT:  Normal  Neck: Supple without bruits or JVD. Lungs:  Resp regular and unlabored, CTA. Heart: RRR no s3, s4, or murmurs. Abdomen: Soft, non-tender, non-distended, BS + x 4.  Extremities: No clubbing, cyanosis or edema. DP/PT/Radials 2+ and equal bilaterally.  Labs    Troponin (Point of Care Test) No results for input(s): TROPIPOC in the last 72 hours. No results for input(s): CKTOTAL, CKMB, TROPONINI in the last 72 hours. Lab Results  Component Value Date   WBC 8.0 12/20/2014   HGB 11.8 (L) 12/20/2014   HCT 36.0 (L) 12/20/2014   MCV 86.1 12/20/2014   PLT 149 (L) 12/20/2014   No results for input(s): NA, K, CL, CO2, BUN, CREATININE, CALCIUM, PROT, BILITOT, ALKPHOS, ALT, AST, GLUCOSE in the last 168 hours.  Invalid input(s): LABALBU Lab Results  Component Value Date   CHOL 103 11/09/2012   HDL 23 (L) 11/09/2012   LDLCALC 22 11/09/2012   TRIG 289 (H) 11/09/2012   No results found for: [facility]   Radiology Studies    No results found.  ECG & Cardiac Imaging    EKG: SR with known q waves in lead III  Echo: 10/14  Study Conclusions  - Left ventricle: The cavity size was normal. Wall thickness was increased in a pattern of mild LVH with mild to moderate basal septal hypertrophy. Systolic function was normal. The estimated ejection fraction was in the range of 55% to 60%. Wall motion was normal; there were no regional wall motion abnormalities. Doppler parameters are consistent with abnormal left ventricular relaxation (grade 1 diastolic dysfunction). - Aortic valve: Mildly calcified annulus. Probably trileaflet. Trivial regurgitation. - Aortic root: The aortic root was mildly ectatic based on BSA. - Ascending aorta: The ascending aorta was mildly dilated. - Mitral valve: Trivial regurgitation. - Left atrium: The atrium was moderately dilated. - Right ventricle: The cavity size was at the upper limits of normal. Systolic function was normal. - Right  atrium: The atrium was mildly dilated. Central venous pressure: 53m Hg (est). - Tricuspid valve: Trivial regurgitation. - Pulmonary arteries: Systolic pressure could not be accurately estimated. - Pericardium, extracardiac: There was no pericardial effusion. Impressions:  - Comparison to prior study June 2011. Mild LVH with mild to moderate basal septal hypertrophy, LVEF 556-81% grade 1 diastolic dysfunction. Moderate left atrial enlargement. Right upper pulmonary vein Doppler signal incomplete. Trivial mitral regurgitation. Mildly dilated aortic root and ascending aorta based on BSA. Mild right atrial enlargement. Trivial tricuspid regurgitation. Unable to assess PASP, CVP appears normal range.  Assessment & Plan    66yo male with PMH of CAD, PAF s/p ablation, GERD, hypothyroidism, HTN, DM, HL, and OSA on Cpap [facility]   1. Chest pain with hx of CAD s/p LAD stent: Reports a sudden onset of chest pain around 2pm this afternoon with nausea, shortness of breath and diaphoresis [facility] Initial trop neg, EKG non acute. Does have hx of LAD stent with lesion below reported at 50% on CT back in 2014. Story  is concerning for ACS, but INR 2.2 and Cr 1.8 at [place]. Discussed cath with patient, but will need to reassess labs in the am to determine whether appropriate to take for cath.  -- cycle troponin -- hold coumadin, recheck INR and Cr in the am -- echo  -- will make NPO at midnight  2. PAF: SR on admission. Will hold coumadin, add IV heparin once INR <2.   3. HTN: stable with current therapy, hold ARB  4. DM: Has been on metformin in the past, but was stopped by his PCP as he Hgb A1c improved.  -- recheck Hgb A1c  5. HL: on statin  6. OSA on cpap: stable  7. COPD: stable, continue home inhalers.   Barnet Pall, NP-C Pager [phone] 12/16/2016, 7:24 PM

## 2016-12-16 NOTE — Progress Notes (Signed)
Patient admitted to 6E24 from [facility] via [facility].  Bed in low position, wheels locked.  Patient denies chest pain, but endorses DOE. Telemetry monitor applied.  Patient oriented to environment, including call bell, TV, meal times, and hourly rounding.  NP notified of arrival.  Will continue to monitor.

## 2016-12-17 ENCOUNTER — Inpatient Hospital Stay (HOSPITAL_BASED_OUTPATIENT_CLINIC_OR_DEPARTMENT_OTHER): Payer: Medicare Other

## 2016-12-17 ENCOUNTER — Encounter (HOSPITAL_COMMUNITY): Payer: Self-pay | Admitting: General Practice

## 2016-12-17 ENCOUNTER — Inpatient Hospital Stay (HOSPITAL_COMMUNITY): Payer: Medicare Other

## 2016-12-17 ENCOUNTER — Other Ambulatory Visit: Payer: Self-pay

## 2016-12-17 DIAGNOSIS — E7800 Pure hypercholesterolemia, unspecified: Secondary | ICD-10-CM | POA: Diagnosis not present

## 2016-12-17 DIAGNOSIS — R079 Chest pain, unspecified: Secondary | ICD-10-CM

## 2016-12-17 DIAGNOSIS — I208 Other forms of angina pectoris: Secondary | ICD-10-CM

## 2016-12-17 DIAGNOSIS — J449 Chronic obstructive pulmonary disease, unspecified: Secondary | ICD-10-CM | POA: Diagnosis not present

## 2016-12-17 DIAGNOSIS — I2510 Atherosclerotic heart disease of native coronary artery without angina pectoris: Secondary | ICD-10-CM | POA: Diagnosis not present

## 2016-12-17 DIAGNOSIS — I10 Essential (primary) hypertension: Secondary | ICD-10-CM | POA: Diagnosis not present

## 2016-12-17 LAB — CBC
HCT: 41.5 % (ref 39.0–52.0)
Hemoglobin: 14 g/dL (ref 13.0–17.0)
MCH: 29.2 pg (ref 26.0–34.0)
MCHC: 33.7 g/dL (ref 30.0–36.0)
MCV: 86.5 fL (ref 78.0–100.0)
PLATELETS: 99 10*3/uL — AB (ref 150–400)
RBC: 4.8 MIL/uL (ref 4.22–5.81)
RDW: 13.6 % (ref 11.5–15.5)
WBC: 7.9 10*3/uL (ref 4.0–10.5)

## 2016-12-17 LAB — LIPID PANEL
CHOL/HDL RATIO: 5.3 ratio
Cholesterol: 106 mg/dL (ref 0–200)
HDL: 20 mg/dL — AB (ref >40)
LDL CALC: 14 mg/dL (ref 0–99)
Triglycerides: 358 mg/dL — ABNORMAL HIGH (ref <150)
VLDL: 72 mg/dL — AB (ref 0–40)

## 2016-12-17 LAB — BASIC METABOLIC PANEL
ANION GAP: 9 (ref 5–15)
BUN: 14 mg/dL (ref 6–20)
CO2: 28 mmol/L (ref 22–32)
Calcium: 8.7 mg/dL — ABNORMAL LOW (ref 8.9–10.3)
Chloride: 97 mmol/L — ABNORMAL LOW (ref 101–111)
Creatinine, Ser: 1.24 mg/dL (ref 0.61–1.24)
GFR calc Af Amer: >60 mL/min (ref >60)
GFR, EST NON AFRICAN AMERICAN: 59 mL/min — AB (ref >60)
GLUCOSE: 169 mg/dL — AB (ref 65–99)
POTASSIUM: 3.9 mmol/L (ref 3.5–5.1)
Sodium: 134 mmol/L — ABNORMAL LOW (ref 135–145)

## 2016-12-17 LAB — NM MYOCAR MULTI W/SPECT W/WALL MOTION / EF
CHL CUP MPHR: 155 {beats}/min
CSEPEDS: 18 s
CSEPEW: 1 METS
CSEPPHR: 92 {beats}/min
Exercise duration (min): 5 min
Percent HR: 59 %
Rest HR: 69 {beats}/min

## 2016-12-17 LAB — GLUCOSE, CAPILLARY
GLUCOSE-CAPILLARY: 172 mg/dL — AB (ref 65–99)
Glucose-Capillary: 160 mg/dL — ABNORMAL HIGH (ref 65–99)
Glucose-Capillary: 189 mg/dL — ABNORMAL HIGH (ref 65–99)
Glucose-Capillary: 287 mg/dL — ABNORMAL HIGH (ref 65–99)

## 2016-12-17 LAB — ECHOCARDIOGRAM COMPLETE
HEIGHTINCHES: 72 in
WEIGHTICAEL: 4457.6 [oz_av]

## 2016-12-17 LAB — TROPONIN I: Troponin I: <0.03 ng/mL (ref <0.03)

## 2016-12-17 LAB — HIV ANTIBODY (ROUTINE TESTING W REFLEX): HIV Screen 4th Generation wRfx: NONREACTIVE

## 2016-12-17 LAB — PROTIME-INR
INR: 1.68
Prothrombin Time: 19.6 seconds — ABNORMAL HIGH (ref 11.4–15.2)

## 2016-12-17 MED ORDER — HEPARIN (PORCINE) IN NACL 100-0.45 UNIT/ML-% IJ SOLN
1700.0000 [IU]/h | INTRAMUSCULAR | Status: DC
  Administered 2016-12-17: 1700 [IU]/h via INTRAVENOUS
  Filled 2016-12-17: qty 250

## 2016-12-17 MED ORDER — REGADENOSON 0.4 MG/5ML IV SOLN
INTRAVENOUS | Status: AC
Start: 2016-12-17 — End: 2016-12-17
  Administered 2016-12-17: 0.4 mg via INTRAVENOUS
  Filled 2016-12-17: qty 5

## 2016-12-17 MED ORDER — INSULIN ASPART 100 UNIT/ML ~~LOC~~ SOLN
0.0000 [IU] | Freq: Three times a day (TID) | SUBCUTANEOUS | Status: DC
  Administered 2016-12-17: 5 [IU] via SUBCUTANEOUS

## 2016-12-17 MED ORDER — TECHNETIUM TC 99M TETROFOSMIN IV KIT
30.0000 | PACK | Freq: Once | INTRAVENOUS | Status: AC | PRN
  Administered 2016-12-17: 30 via INTRAVENOUS

## 2016-12-17 MED ORDER — HEPARIN BOLUS VIA INFUSION
4000.0000 [IU] | Freq: Once | INTRAVENOUS | Status: AC
  Administered 2016-12-17: 4000 [IU] via INTRAVENOUS
  Filled 2016-12-17: qty 4000

## 2016-12-17 MED ORDER — REGADENOSON 0.4 MG/5ML IV SOLN
0.4000 mg | Freq: Once | INTRAVENOUS | Status: AC
  Administered 2016-12-17: 0.4 mg via INTRAVENOUS
  Filled 2016-12-17: qty 5

## 2016-12-17 MED ORDER — INSULIN ASPART 100 UNIT/ML ~~LOC~~ SOLN: 0.0000 [IU] | Freq: Every day | SUBCUTANEOUS | Status: DC

## 2016-12-17 MED ORDER — ACETAMINOPHEN 325 MG PO TABS
ORAL_TABLET | ORAL | Status: AC
  Filled 2016-12-17: qty 2

## 2016-12-17 MED ORDER — TECHNETIUM TC 99M TETROFOSMIN IV KIT
10.0000 | PACK | Freq: Once | INTRAVENOUS | Status: AC | PRN
  Administered 2016-12-17: 10 via INTRAVENOUS

## 2016-12-17 MED ORDER — IPRATROPIUM-ALBUTEROL 18-103 MCG/ACT IN AERO: 2.0000 | INHALATION_SPRAY | RESPIRATORY_TRACT | 0 refills | Status: DC | PRN

## 2016-12-17 MED ORDER — NITROGLYCERIN 0.4 MG SL SUBL: 0.4000 mg | SUBLINGUAL_TABLET | SUBLINGUAL | 1 refills | Status: DC | PRN

## 2016-12-17 NOTE — Discharge Summary (Signed)
Discharge Summary    Patient ID: Steve Anthony,  MRN: [id number], DOB/AGE: 1950-10-10 66 y.o.  Admit date: 12/16/2016 Discharge date: 12/17/2016   Primary Care Provider: [name] Primary Cardiologist: Dr. Rayann Heman  Discharge Diagnoses    Principal Problem:   Chest pain Active Problems:   Essential hypertension   Unstable angina (HCC)   Atrial fibrillation (HCC)   GERD   Sleep apnea   Pure hypercholesterolemia   S/P coronary artery stent placement   Allergies Allergies  Allergen Reactions  . Penicillins Hives and Shortness Of Breath    Has patient had a PCN reaction causing immediate rash, facial/tongue/throat swelling, SOB or lightheadedness with hypotension: Yes Has patient had a PCN reaction causing severe rash involving mucus membranes or skin necrosis: No Has patient had a PCN reaction that required hospitalization: Yes Has patient had a PCN reaction occurring within the last 10 years: No If all of the above answers are "NO", then may proceed with Cephalosporin use.      History of Present Illness     Mr. Steve Anthony is a 13 male with PMH of CAD, PAF s/p ablation, GERD, hypothyroidism, HTN, DM, HL, and OSA on Cpap. He had a cath in 2006 with stent to the LAD, and repeat cath in 2011 that showed patent stent with moderate disease below previously placed stent. Dx with PAF in 2014 and placed on coumadin. Has underwent several ablations since that time and followed by Dr. Rayann Heman. He had a coronary CTA in 2014 that report 40-50% lesion in the mid LAD. Last echo in 2014 showed normal EF.   He reports being in his usual state of health up until this morning. Felt hot and "sweaty" this morning and weak. When to his daughter's house for Thanksgiving. Around 2pm had sudden onset of chest pain, became short of breath, diaphoretic, and nauseated. Family stated he looked gray. Took blood pressure and was 90 systolic. Presented to [facility].  Labs showed negative  troponin x1, stable electrolytes with Cr 1.8. EKG showed SR with q waves in lead III similar to previous EKGs. CXR negative. Reports he was given 1 SL nitro with relief of chest pain, but blood pressure became soft in the 90s. Given IVF with improvement. He was transferred to [facility] for further care.    Hospital Course     Consultants: none   Troponin x 3 negative. EKG without signs of acute ischemia. Chest pain is suspicious for ACS, although the timing of chest pain to a meal raises the possibility that this is GI related.   Echocardiogram with normal function and no WMA. Lexiscan myoview stress test without reversible ischemia and EF 71% . In this context, will not pursue further ischemic evaluation.  Pt has been chest pain free since nitro was given last evening. Will discharge with follow up in clinic.   No medication changes made, prescribed new SL nitro.  Patient seen and examined by Dr. Oval Linsey today and was stable for discharge. All follow up has been arranged.  _____________  Discharge Vitals Blood pressure 114/71, pulse 84, temperature (!) 97.4 F (36.3 C), temperature source Oral, height 6' (1.829 m), weight 278 lb 9.6 oz (126.4 kg), SpO2 96 %.  Filed Weights   12/16/16 1848 12/17/16 0527  Weight: 282 lb 4.8 oz (128.1 kg) 278 lb 9.6 oz (126.4 kg)    Labs & Radiologic Studies    CBC Recent Labs    12/17/16 0807  WBC 7.9  HGB 14.0  HCT 41.5  MCV 86.5  PLT 99*   Basic Metabolic Panel Recent Labs    12/17/16 0806  NA 134*  K 3.9  CL 97*  CO2 28  GLUCOSE 169*  BUN 14  CREATININE 1.24  CALCIUM 8.7*   Liver Function Tests No results for input(s): AST, ALT, ALKPHOS, BILITOT, PROT, ALBUMIN in the last 72 hours. No results for input(s): LIPASE, AMYLASE in the last 72 hours. Cardiac Enzymes Recent Labs    12/16/16 2026 12/17/16 0110 12/17/16 0806  TROPONINI <0.03 <0.03 <0.03   Fasting Lipid Panel Recent Labs    12/17/16 0302  CHOL 106  HDL 20*    LDLCALC 14  TRIG 358*  CHOLHDL 5.3   _____________    Carlton Adam myoview 12/17/16: Nm Myocar Multi W/spect Tamela Oddi Motion / Ef Result Date: 12/17/2016 CLINICAL DATA:  66 year old with chest pain. EXAM: MYOCARDIAL IMAGING WITH SPECT (REST AND PHARMACOLOGIC-STRESS) GATED LEFT VENTRICULAR WALL MOTION STUDY LEFT VENTRICULAR EJECTION FRACTION TECHNIQUE: Standard myocardial SPECT imaging was performed after resting intravenous injection of 10 mCi Tc-57mtetrofosmin. Subsequently, intravenous infusion of Lexiscan was performed under the supervision of the Cardiology staff. At peak effect of the drug, 30 mCi Tc-975metrofosmin was injected intravenously and standard myocardial SPECT imaging was performed. Quantitative gated imaging was also performed to evaluate left ventricular wall motion, and estimate left ventricular ejection fraction. COMPARISON:  07/24/2014 FINDINGS: Perfusion: No decreased activity in the left ventricle on stress imaging to suggest reversible ischemia or infarction. Wall Motion: Normal left ventricular wall motion. No left ventricular dilation. Left Ventricular Ejection Fraction: 71 % End diastolic volume 11801l End systolic volume 34 ml IMPRESSION: 1. No reversible ischemia or infarction. 2. Normal left ventricular wall motion. 3. Left ventricular ejection fraction is 71%. 4. Non invasive risk stratification*: Low *2012 Appropriate Use Criteria for Coronary Revascularization Focused Update: J Am Coll Cardiol. 206553;74(8):[email or web address]?[email or web address] Electronically Signed   By: AdMarkus Daft.D.   On: 12/17/2016 17:38     Diagnostic Studies/Procedures    Echo 12/17/16: Study Conclusions - Left ventricle: The cavity size was normal. Wall thickness was   increased in a pattern of mild LVH. Systolic function was normal.   The estimated ejection fraction was in the range of 55% to 60%.   Wall motion was normal; there were no regional wall motion    abnormalities. Left ventricular diastolic function parameters   were normal. - Left atrium: The atrium was mildly dilated.    Heart cath 11/10/12: Stable single vessel CAD with patent stent mid LAD, stable moderate stenosis mid LAD   Disposition   Pt is being discharged home today in good condition.  Follow-up Plans & Appointments   Office will call pt with follow up appt. Follow-up Information    SePatsey BertholdNP Follow up on 01/12/2017.   Specialty:  Cardiology Why:  Please arrive at 8:15 AM for an 8:40 AM appointment. Contact information: [phone]        Discharge Instructions    Diet - low sodium heart healthy   Complete by:  As directed    Diet Carb Modified   Complete by:  As directed    Increase activity slowly   Complete by:  As directed    Increase activity slowly   Complete by:  As directed       Discharge Medications   Current Discharge Medication List    START taking these  medications   Details  nitroGLYCERIN (NITROSTAT) 0.4 MG SL tablet Place 1 tablet (0.4 mg total) under the tongue every 5 (five) minutes x 3 doses as needed for chest pain. Qty: 25 tablet, Refills: 1      CONTINUE these medications which have CHANGED   Details  albuterol-ipratropium (COMBIVENT) 18-103 MCG/ACT inhaler Inhale 2 puffs into the lungs every 4 (four) hours as needed. Qty: 1 Inhaler, Refills: 0      CONTINUE these medications which have NOT CHANGED   Details  amLODipine (NORVASC) 5 MG tablet TAKE 1 TABLET (5 MG TOTAL) BY MOUTH DAILY. Qty: 90 tablet, Refills: 1    aspirin EC 81 MG EC tablet Take 1 tablet (81 mg total) by mouth daily.    atorvastatin (LIPITOR) 40 MG tablet Take 40 mg by mouth at bedtime.     clonazePAM (KLONOPIN) 1 MG tablet Take 1 mg by mouth at bedtime.    cyanocobalamin 1000 MCG tablet Take 1,000 mcg by mouth daily.    Associated Diagnoses: Hypokalemia; Hypomagnesemia; Palpitations; AF (atrial  fibrillation) (HCC); HTN (hypertension)    docusate sodium (COLACE) 100 MG capsule Take 100 mg by mouth daily.    fenofibrate 160 MG tablet Take 160 mg by mouth daily.      fentaNYL (DURAGESIC - DOSED MCG/HR) 25 MCG/HR patch Place 1 patch onto the skin every 3 (three) days. Refills: 0    Ferrous Fumarate 63 (20 Fe) MG TABS Take 1 tablet by mouth daily.    HYDROcodone-acetaminophen (NORCO) 7.5-325 MG tablet Take 1-2 tablets by mouth every 6 (six) hours as needed for moderate pain.     losartan (COZAAR) 50 MG tablet Take 100 mg by mouth daily.     Melatonin 5 MG TABS Take 5 mg by mouth at bedtime.   Associated Diagnoses: Hypokalemia; Hypomagnesemia; Palpitations; AF (atrial fibrillation) (HCC); HTN (hypertension)    metoprolol succinate (TOPROL-XL) 50 MG 24 hr tablet TAKE 1 TABLET BY MOUTH TWICE A DAY Qty: 60 tablet, Refills: 3    pantoprazole (PROTONIX) 40 MG tablet Take 40 mg by mouth daily.      spironolactone (ALDACTONE) 25 MG tablet Take 25 mg by mouth daily. Refills: 1    Tamsulosin HCl (FLOMAX) 0.4 MG CAPS Take 0.4 mg by mouth daily.      warfarin (COUMADIN) 5 MG tablet Take as directed by [facility] Qty: 50 tablet, Refills: 3      STOP taking these medications     oxyCODONE (OXY IR/ROXICODONE) 5 MG immediate release tablet            Outstanding Labs/Studies   None  Duration of Discharge Encounter   Greater than 30 minutes including physician time.  SignedSuanne Marker Ellenor Wisniewski PA-C 12/17/2016, 6:25 PM

## 2016-12-17 NOTE — Progress Notes (Signed)
   [name] presented for a nuclear stress test today.  No immediate complications.  Stress imaging is pending at this time.  Preliminary EKG findings may be listed in the chart, but the stress test result will not be finalized until perfusion imaging is complete.  1 day study, [facility] to read.  Rosaria Ferries, PA-C 12/17/2016, 1:07 PM

## 2016-12-17 NOTE — Care Management Obs Status (Signed)
[place] NOTIFICATION   Patient Details  Name: Steve Anthony MRN: [id number] Date of Birth: 1950-08-22   Medicare Observation Status Notification Given:  Yes    Dawayne Patricia, RN 12/17/2016, 2:57 PM

## 2016-12-17 NOTE — Progress Notes (Signed)
Discharge order obtained.  IV removed intact, telemetry monitor removed.  Reviewed AVS with patient/family, including medications, activity/restrictions, follow-up call for appointments.  Patient and family verbalized understanding.  Questions asked and answered.  Belongings given to family/patient.  Copy of AVS signature form placed in chart.

## 2016-12-17 NOTE — Progress Notes (Signed)
Progress Note  Patient Name: Steve Anthony Date of Encounter: 12/17/2016  Primary Cardiologist: Dr. Rayann Heman  Subjective   Pt has had no recurrence of chest pain since receiving nitro x 1 on his initial presentation.  Inpatient Medications    Scheduled Meds: . amLODipine  5 mg Oral Daily  . aspirin EC  81 mg Oral Daily  . atorvastatin  40 mg Oral QHS  . fenofibrate  160 mg Oral Daily  . insulin aspart  0-5 Units Subcutaneous QHS  . insulin aspart  0-9 Units Subcutaneous TID WC  . mouth rinse  15 mL Mouth Rinse BID  . metoprolol succinate  50 mg Oral BID  . pantoprazole  40 mg Oral Daily  . tamsulosin  0.4 mg Oral Daily   Continuous Infusions:  PRN Meds: acetaminophen, ipratropium-albuterol, nitroGLYCERIN, ondansetron (ZOFRAN) IV, oxyCODONE   Vital Signs    Vitals:   12/16/16 1848 12/16/16 2100 12/17/16 0527  BP: 136/87 (!) 146/100 (!) 133/91  Pulse: 74 69 64  Temp: (!) 97.5 F (36.4 C) 98 F (36.7 C) 98.2 F (36.8 C)  TempSrc: Oral Oral Oral  SpO2: 97% 98% 95%  Weight: 282 lb 4.8 oz (128.1 kg)  278 lb 9.6 oz (126.4 kg)  Height: 6' (1.829 m)      Intake/Output Summary (Last 24 hours) at 12/17/2016 0931 Last data filed at 12/17/2016 0555 Gross per 24 hour  Intake 120 ml  Output 1625 ml  Net -1505 ml   Filed Weights   12/16/16 1848 12/17/16 0527  Weight: 282 lb 4.8 oz (128.1 kg) 278 lb 9.6 oz (126.4 kg)     Physical Exam   General: Well developed, well nourished, male appearing in no acute distress. Head: Normocephalic, atraumatic.  Neck: Supple without bruits, no JVD. Lungs:  Resp regular and unlabored, CTA. Heart: RRR, S1, S2, no murmur; no rub. Abdomen: Soft, non-tender, non-distended with normoactive bowel sounds. No hepatomegaly. No rebound/guarding. No obvious abdominal masses. Extremities: No clubbing, cyanosis, no edema. Distal pedal pulses are 1+ bilaterally. Neuro: Alert and oriented X 3. Moves all extremities spontaneously. Psych: Normal  affect.  Labs    ChemistryNo results for input(s): NA, K, CL, CO2, GLUCOSE, BUN, CREATININE, CALCIUM, PROT, ALBUMIN, AST, ALT, ALKPHOS, BILITOT, GFRNONAA, GFRAA, ANIONGAP in the last 168 hours.   HematologyNo results for input(s): WBC, RBC, HGB, HCT, MCV, MCH, MCHC, RDW, PLT in the last 168 hours.  Cardiac Enzymes Recent Labs  Lab 12/16/16 2026 12/17/16 0110  TROPONINI <0.03 <0.03   No results for input(s): TROPIPOC in the last 168 hours.   BNPNo results for input(s): BNP, PROBNP in the last 168 hours.   DDimer No results for input(s): DDIMER in the last 168 hours.   Radiology    No results found.   Telemetry    Sinus rhythm - Personally Reviewed  ECG    pending - Personally Reviewed   Cardiac Studies   Echo 12/17/16: Study Conclusions - Left ventricle: The cavity size was normal. Wall thickness was   increased in a pattern of mild LVH. Systolic function was normal.   The estimated ejection fraction was in the range of 55% to 60%.   Wall motion was normal; there were no regional wall motion   abnormalities. Left ventricular diastolic function parameters   were normal. - Left atrium: The atrium was mildly dilated.  Heart cath 11/10/12: Stable single vessel CAD with patent stent mid LAD, stable moderate stenosis mid LAD  Echo 11/09/12: Study  Conclusions - Left ventricle: The cavity size was normal. Wall thickness was increased in a pattern of mild LVH with mild to moderate basal septal hypertrophy. Systolic function was normal. The estimated ejection fraction was in the range of 55% to 60%. Wall motion was normal; there were no regional wall motion abnormalities. Doppler parameters are consistent with abnormal left ventricular relaxation (grade 1 diastolic dysfunction). - Aortic valve: Mildly calcified annulus. Probably trileaflet. Trivial regurgitation. - Aortic root: The aortic root was mildly ectatic based on BSA. - Ascending aorta:  The ascending aorta was mildly dilated. - Mitral valve: Trivial regurgitation. - Left atrium: The atrium was moderately dilated. - Right ventricle: The cavity size was at the upper limits of normal. Systolic function was normal. - Right atrium: The atrium was mildly dilated. Central venous pressure: 33m Hg (est). - Tricuspid valve: Trivial regurgitation. - Pulmonary arteries: Systolic pressure could not be accurately estimated. - Pericardium, extracardiac: There was no pericardial effusion.  Impressions: - Comparison to prior study June 2011. Mild LVH with mild to moderate basal septal hypertrophy, LVEF 500-86% grade 1 diastolic dysfunction. Moderate left atrial enlargement. Right upper pulmonary vein Doppler signal incomplete. Trivial mitral regurgitation. Mildly dilated aortic root and ascending aorta based on BSA. Mild right atrial enlargement. Trivial tricuspid regurgitation. Unable to assess PASP, CVP appears normal range.  Patient Profile     66y.o. male with PMH of CAD, PAF s/p ablation, GERD, hypothyroidism, HTN, DM, HL, and OSA on Cpap [facility]  Assessment & Plan    1. Chest pain, CAD s/p stent to LAD (2006), last cath in 2014 with patent stent and 50% stenosis of LAD  - INR 1.68 - sCr 1.24 - pt chest pain suspicious for ACS - troponin x 3 negative - EKG pending - echo pending - because his creatinine is 1.24, baseline 0.8, and echo was normal without WMA, consider nuclear stress test - will discuss with attending  2. HLD - LDL 14 - continue current regimen  3. ILR (2014) - no Afib burden at last interrogation (03/2016), continues on coumadin  4. Afib s/p ablations - telemetry with sinus rhythm - coumadin on hold - INR 1.68, start heparin drip if going to cath; if nuc today then resume coumadin tonight  5. HTN - continue current meds, hold ARB for possible cath  6. OSA on CPAP  7. COPD - stable on  home inhalers   Signed, ALedora Bottcher, PA-C 9:31 AM 12/17/2016 Pager: [phone]

## 2016-12-17 NOTE — Progress Notes (Signed)
  Echocardiogram 2D Echocardiogram has been performed.  Steve Anthony 12/17/2016, 9:43 AM

## 2016-12-17 NOTE — Progress Notes (Addendum)
ANTICOAGULATION CONSULT NOTE - Initial Consult  Pharmacy Consult for Heparin Indication: atrial fibrillation  Allergies  Allergen Reactions  . Penicillins Hives and Shortness Of Breath    Has patient had a PCN reaction causing immediate rash, facial/tongue/throat swelling, SOB or lightheadedness with hypotension: Yes Has patient had a PCN reaction causing severe rash involving mucus membranes or skin necrosis: No Has patient had a PCN reaction that required hospitalization: Yes Has patient had a PCN reaction occurring within the last 10 years: No If all of the above answers are "NO", then may proceed with Cephalosporin use.     Patient Measurements: Height: 6' (182.9 cm) Weight: 278 lb 9.6 oz (126.4 kg) IBW/kg (Calculated) : 77.6 Heparin Dosing Weight: 106 kg  Vital Signs: Temp: 98.2 F (36.8 C) (11/23 0527) Temp Source: Oral (11/23 0527) BP: 140/92 (11/23 0800) Pulse Rate: 77 (11/23 0900)  Labs: Recent Labs    12/16/16 2026 12/17/16 0110 12/17/16 0806 12/17/16 0807  HGB  --   --   --  14.0  HCT  --   --   --  41.5  PLT  --   --   --  99*  LABPROT  --   --  19.6*  --   INR  --   --  1.68  --   CREATININE  --   --  1.24  --   TROPONINI <0.03 <0.03 <0.03  --     Estimated Creatinine Clearance: 81.6 mL/min (by C-G formula based on SCr of 1.24 mg/dL).   Medical History: Past Medical History:  Diagnosis Date  . Arthritis    "in my back and right leg" (11/08/2012)  . CAD (coronary artery disease)    a. S/P PCI mid LAD (vision stent) 08/31/2004;  b. repeat cath 6/11 - no progression of CAD. c. Cath 10/2012 given moderate CAD on cardiac CT - no change from prior cath.  . Cancer ([place])    melanoma removed from neck   . Chest pain 12/16/2016  . Chronic lower back pain   . COPD (chronic obstructive pulmonary disease) ([place])   . DJD (degenerative joint disease) of lumbar spine   . Dyslipidemia   . GERD (gastroesophageal reflux disease)   . MOLMBEML(544.9)    "sometimes  weekly" (11/08/2012)  . Hypertension   . Hyperthyroidism   . Hypokalemia   . Hypomagnesemia    a. 10/2012 - Mg 0.7.  . Nephrolithiasis   . On home oxygen therapy    "2L q hs" (11/08/2012)  . OSA (obstructive sleep apnea)    a. cpap noncompliance- patient reports on 12/10/14- does not use CPAP  . Palpitations    a. 10/2012 - s/p LINQ loop recorder to assess for arrhythmia.   . Paroxysmal A-fib ([place])    a. S/P PVI 2/11 and 9/11  . Shortness of breath dyspnea    with exertion   . SVT (supraventricular tachycardia) ([place])    a. Atypical AVNRT of the slow pathway  . Type II diabetes mellitus ([facility])     Medications:  Infusions:  . heparin      Assessment: 66 year old make with Afib and CAD s/p stent to LAD 2006 presented to [facility] with CP, now transferred to [facility]. Currently plans are to Cath or conduct nuclear stress test. Patient is currently in NSR and is on PTA warfarin for Afib. INR at 1.68. Pharmacy consulted to dose heparin for Afib. Hgb stable, but platelets low at 99, no signs of bleeding.  Goal of Therapy:  Heparin level 0.3-0.7 units/ml Monitor platelets by anticoagulation protocol: Yes   Plan:  Heparin 4000 unit bolus x 1 Heparin 1700 units/hr gtt HL in 6 hours, then daily Daily CBC, monitor platelets Follow up when wanting to resume warfarin   Leroy Libman, PharmD Pharmacy Resident Pager: [phone]

## 2016-12-17 NOTE — Care Management CC44 (Signed)
Condition Code 44 Documentation Completed  Patient Details  Name: Steve Anthony MRN: [id number] Date of Birth: 09/26/1950   Condition Code 44 given:  Yes Patient signature on Condition Code 44 notice:  Yes Documentation of 2 MD's agreement:  Yes Code 44 added to claim:  Yes    Dawayne Patricia, RN 12/17/2016, 2:57 PM

## 2016-12-22 ENCOUNTER — Telehealth: Payer: Self-pay

## 2016-12-23 NOTE — Telephone Encounter (Signed)
That is ok, please let the patient know that we will not continue to prescribe this, he should follow-up with his PCP.

## 2016-12-24 NOTE — Telephone Encounter (Signed)
Pt is advised and verbalized understanding.

## 2017-01-10 NOTE — Progress Notes (Signed)
Electrophysiology Office Note Date: 01/11/2017  ID:  Steve Anthony, DOB [birth year], MRN [id number]  PCP: Steve Mina., MD Electrophysiologist: Steve Anthony  CC: AF follow up  Steve Anthony is a 66 y.o. male seen today for Dr Steve Anthony.  He presents today for routine electrophysiology followup.  Since last being seen in our clinic, the patient reports doing reasoanably well.  He remains on chronic home O2.  He was recently hospitalized for an episode of chest pain that occurred after eating. Enzymes and myoview were negative. He has had one other episode of chest pain since discharge that was very short, occurred while seated and resolved without intervention.  He denies palpitations, dyspnea (above baseline), PND, orthopnea, nausea, vomiting, dizziness, syncope, edema, weight gain, or early satiety.  Past Medical History:  Diagnosis Date  . Arthritis   . CAD (coronary artery disease)    a. S/P PCI mid LAD (vision stent) 08/31/2004;  b. repeat cath 6/11 - no progression of CAD. c. Cath 10/2012 given moderate CAD on cardiac CT - no change from prior cath.  . Cancer ([place])    melanoma removed from neck   . Chest pain 12/16/2016  . COPD (chronic obstructive pulmonary disease) ([place])   . DJD (degenerative joint disease) of lumbar spine   . Dyslipidemia   . GERD (gastroesophageal reflux disease)   . Hypertension   . Hyperthyroidism   . Nephrolithiasis   . On home oxygen therapy    "2L q hs" (11/08/2012)  . OSA (obstructive sleep apnea)    a. cpap noncompliance- patient reports on 12/10/14- does not use CPAP  . Palpitations    a. 10/2012 - s/p LINQ loop recorder to assess for arrhythmia.   . Paroxysmal A-fib ([place])    a. S/P PVI 2/11 and 9/11  . SVT (supraventricular tachycardia) ([place])    a. Atypical AVNRT of the slow pathway  . Type II diabetes mellitus ([place])    Past Surgical History:  Procedure Laterality Date  . ATRIAL ABLATION SURGERY  2007   S/P slow pathway ablation for atypical  AVNRT   . CHOLECYSTECTOMY  ~ 2008  . COLON RESECTION  02/2013   . CORONARY ANGIOPLASTY WITH STENT PLACEMENT  08/30/2004   Vision; to LAD   . CYSTOSCOPY W/ STONE MANIPULATION  ~ 1998   "once" (11/08/2012)  . EP Study  2008   Negative EP study in [place]  . FRACTURE SURGERY    . LEFT HEART CATHETERIZATION WITH CORONARY ANGIOGRAM N/A 11/10/2012   Procedure: LEFT HEART CATHETERIZATION WITH CORONARY ANGIOGRAM;  Surgeon: Burnell Blanks, MD;  Location[facility] CATH LAB;  Service: Cardiovascular;  Laterality: N/A;  . LOOP RECORDER IMPLANT N/A 11/10/2012   Medtronic LinQ implanted by Dr Steve Anthony for afib management  . [place] SURGERY  2000; 2002  . POSTERIOR LUMBAR FUSION  2004  . PROSTATE SURGERY  ~ 2009  . PVI/ CTI ablation  02/25/2009 and 09/2009   S/P afib/ CTI ablation  . TIBIA FRACTURE SURGERY Right 1990   "broke in 3 places; had rod put in" (11/08/2012)  . TIBIA HARDWARE REMOVAL Right ~ 1991  . TONSILLECTOMY  1965  . TOTAL HIP ARTHROPLASTY Right 12/18/2014   Procedure: RIGHT TOTAL HIP ARTHROPLASTY ANTERIOR APPROACH;  Surgeon: Gaynelle Arabian, MD;  Location: [facility] ORS;  Service: Orthopedics;  Laterality: Right;    Current Outpatient Medications  Medication Sig Dispense Refill  . albuterol-ipratropium (COMBIVENT) 18-103 MCG/ACT inhaler Inhale 2 puffs into the lungs  every 4 (four) hours as needed. 1 Inhaler 0  . amLODipine (NORVASC) 5 MG tablet TAKE 1 TABLET (5 MG TOTAL) BY MOUTH DAILY. (Patient taking differently: Take 2.5 mg by mouth daily. ) 90 tablet 1  . aspirin EC 81 MG EC tablet Take 1 tablet (81 mg total) by mouth daily.    Marland Kitchen atorvastatin (LIPITOR) 40 MG tablet Take 40 mg by mouth at bedtime.     . clonazePAM (KLONOPIN) 1 MG tablet Take 1 mg by mouth at bedtime.    . cyanocobalamin 1000 MCG tablet Take 1,000 mcg by mouth daily.     Marland Kitchen docusate sodium (COLACE) 100 MG capsule Take 100 mg by mouth daily.    . fenofibrate 160 MG tablet Take 160 mg by mouth daily.      . fentaNYL  (DURAGESIC - DOSED MCG/HR) 25 MCG/HR patch Place 1 patch onto the skin every 3 (three) days.  0  . Ferrous Fumarate 63 (20 Fe) MG TABS Take 1 tablet by mouth daily.    Marland Kitchen HYDROcodone-acetaminophen (NORCO) 7.5-325 MG tablet Take 1-2 tablets by mouth every 6 (six) hours as needed for moderate pain.     Marland Kitchen losartan (COZAAR) 50 MG tablet Take 100 mg by mouth daily.     . Melatonin 5 MG TABS Take 5 mg by mouth at bedtime.    . metoprolol succinate (TOPROL-XL) 50 MG 24 hr tablet TAKE 1 TABLET BY MOUTH TWICE A DAY 60 tablet 3  . nitroGLYCERIN (NITROSTAT) 0.4 MG SL tablet Place 1 tablet (0.4 mg total) under the tongue every 5 (five) minutes x 3 doses as needed for chest pain. 25 tablet 1  . pantoprazole (PROTONIX) 40 MG tablet Take 40 mg by mouth daily.      Marland Kitchen spironolactone (ALDACTONE) 25 MG tablet Take 25 mg by mouth daily.  1  . Tamsulosin HCl (FLOMAX) 0.4 MG CAPS Take 0.4 mg by mouth daily.      Marland Kitchen warfarin (COUMADIN) 5 MG tablet Take as directed by Coumadin Clinic (Patient taking differently: Take 5-7.5 mg by mouth See admin instructions. Take 7.73m SUN TUES THUR and 588mMON WED FRI SAT or as directed by Coumadin Clinic) 50 tablet 3   No current facility-administered medications for this visit.     Allergies:   Penicillins   Social History: Social History   Socioeconomic History  . Marital status: Married    Spouse name: Not on file  . Number of children: Not on file  . Years of education: Not on file  . Highest education level: Not on file  Social Needs  . Financial resource strain: Not on file  . Food insecurity - worry: Not on file  . Food insecurity - inability: Not on file  . Transportation needs - medical: Not on file  . Transportation needs - non-medical: Not on file  Occupational History  . Occupation: DISABLED    Employer: DISABLED  . Occupation: Retired DOT truck drGeophysicist/field seismologistTobacco Use  . Smoking status: Former Smoker    Packs/day: 3.00    Years: 38.00    Pack years: 114.00      Types: Cigarettes    Last attempt to quit: 03/25/2004    Years since quitting: 12.8  . Smokeless tobacco: Former [employer] developer  Types: [place]oc[place]: 04/25/2004  Substance and Sexual Activity  . Alcohol use: Yes    Comment: 11/08/2012 "quit drinking in 1989"  . Drug use: No  . Sexual  activity: Not Currently  Other Topics Concern  . Not on file  Social History Narrative   Resides in [place] with his wife   Two children and two grandchildren   Attends [employer]    Family History: Family History  Problem Relation Age of Onset  . Emphysema Mother   . Heart attack Father        CVA  . Hypertension Father   . Diabetes Father   . Heart disease Father        MI  . Emphysema Maternal Grandfather        Smoker  . Heart disease Paternal Grandfather     Review of Systems: All other systems reviewed and are otherwise negative except as noted above.   Physical Exam: VS:  BP 106/60   Pulse 66   Ht 6' (1.829 m)   Wt 286 lb (129.7 kg)   SpO2 94%   BMI 38.79 kg/m  , BMI Body mass index is 38.79 kg/m. Wt Readings from Last 3 Encounters:  01/11/17 286 lb (129.7 kg)  12/17/16 278 lb 9.6 oz (126.4 kg)  01/05/16 251 lb 12.8 oz (114.2 kg)    GEN- The patient is chronically ill appearing, alert and oriented x 3 today.   HEENT: normocephalic, atraumatic; sclera clear, conjunctiva pink; hearing intact; oropharynx clear; neck supple  Lungs- Clear to ausculation bilaterally, normal work of breathing.  No wheezes, rales, rhonchi Heart- Regular rate and rhythm  GI- soft, non-tender, non-distended, bowel sounds present  Extremities- no clubbing, cyanosis, or edema  MS- no significant deformity or atrophy Skin- warm and dry, no rash or lesion  Psych- euthymic mood, full affect Neuro- strength and sensation are intact   EKG:  EKG is ordered today. The ekg ordered today shows sinus rhythm, rate 66  Recent Labs: 12/17/2016: BUN 14; Creatinine, Ser 1.24; Hemoglobin  14.0; Platelets 99; Potassium 3.9; Sodium 134    Other studies Reviewed: Additional studies/ records that were reviewed today include: Dr Jackalyn Lombard office notes  Assessment and Plan:  1.  Paroxysmal atrial fibrillation Maintaining SR off AAD by symptoms today Continue Warfarin for CHADS2VASC of 3 Recent CBC reviewed ILR previously implanted for AF management has reached RRT.  Pt declines explant.   2.  HTN Stable No change required today  3.  CAD Recent hospitalization reviewed Continue medical therapy    Current medicines are reviewed at length with the patient today.   The patient does not have concerns regarding his medicines.  The following changes were made today:  none  Labs/ tests ordered today include: none No orders of the defined types were placed in this encounter.    Disposition:   Follow up with Dr Steve Anthony 1 year     Signed, Chanetta Marshall, NP 01/11/2017 9:02 AM   [facility] [place] [address] [phone] (office) [phone] (fax)

## 2017-01-11 ENCOUNTER — Ambulatory Visit (INDEPENDENT_AMBULATORY_CARE_PROVIDER_SITE_OTHER): Payer: Medicare Other | Admitting: *Deleted

## 2017-01-11 ENCOUNTER — Encounter: Payer: Self-pay | Admitting: Nurse Practitioner

## 2017-01-11 ENCOUNTER — Ambulatory Visit: Payer: Medicare Other | Admitting: Nurse Practitioner

## 2017-01-11 VITALS — BP 106/60 | HR 66 | Ht 72.0 in | Wt 286.0 lb

## 2017-01-11 DIAGNOSIS — I480 Paroxysmal atrial fibrillation: Secondary | ICD-10-CM

## 2017-01-11 DIAGNOSIS — Z7901 Long term (current) use of anticoagulants: Secondary | ICD-10-CM

## 2017-01-11 DIAGNOSIS — I10 Essential (primary) hypertension: Secondary | ICD-10-CM

## 2017-01-11 DIAGNOSIS — I2583 Coronary atherosclerosis due to lipid rich plaque: Secondary | ICD-10-CM

## 2017-01-11 DIAGNOSIS — I2510 Atherosclerotic heart disease of native coronary artery without angina pectoris: Secondary | ICD-10-CM | POA: Diagnosis not present

## 2017-01-11 LAB — POCT INR: INR: 4.2

## 2017-01-11 NOTE — Patient Instructions (Signed)
Description   Do not take any Coumadin today and tomorrow take 1/2 tablet then continue taking same dosage 1 tablet daily except 1.5 tablets on Sundays, Tuesdays and Thursdays.  Recheck INR in 2 weeks.   Main [phone] Coumadin Clinic [phone]

## 2017-01-11 NOTE — Patient Instructions (Addendum)
Medication Instructions:   Your physician recommends that you continue on your current medications as directed. Please refer to the Current Medication list given to you today.   If you need a refill on your cardiac medications before your next appointment, please call your pharmacy.  Labwork: NONE ORDERED  TODAY    Testing/Procedures: NONE ORDERED  TODAY    Follow-Up:  Your physician wants you to follow-up in: ONE YEAR WITH ALLRED You will receive a reminder letter in the mail two months in advance. If you don't receive a letter, please call our office to schedule the follow-up appointment.      Any Other Special Instructions Will Be Listed Below (If Applicable).                                                                                                                                                   

## 2017-01-12 ENCOUNTER — Encounter: Payer: Medicare Other | Admitting: Nurse Practitioner

## 2017-02-01 ENCOUNTER — Ambulatory Visit (INDEPENDENT_AMBULATORY_CARE_PROVIDER_SITE_OTHER): Payer: Medicare Other | Admitting: *Deleted

## 2017-02-01 DIAGNOSIS — I480 Paroxysmal atrial fibrillation: Secondary | ICD-10-CM

## 2017-02-01 DIAGNOSIS — Z7901 Long term (current) use of anticoagulants: Secondary | ICD-10-CM | POA: Diagnosis not present

## 2017-02-01 LAB — POCT INR: INR: 3.7

## 2017-02-01 NOTE — Patient Instructions (Signed)
Description   Do not take any Coumadin today then start taking 1 tablet daily except 1.5 tablets on Sundays and Thursdays.  Recheck INR in 2 weeks.   Main [phone] Coumadin Clinic [phone]

## 2017-02-02 ENCOUNTER — Telehealth: Payer: Self-pay | Admitting: Pharmacist

## 2017-02-02 NOTE — Telephone Encounter (Signed)
Pt called and stated he was started on Levaquin and prednisone taper yesterday (1/8) at PCP for 10-12 days. Rescheduled Coumadin appt to Monday 1/14 per pt availability. Advised him to take 1 tablet daily except skip his dose on Saturday until his INR is rechecked. He will bring in his pill bottles with him on Monday.

## 2017-02-07 ENCOUNTER — Ambulatory Visit (INDEPENDENT_AMBULATORY_CARE_PROVIDER_SITE_OTHER): Payer: Medicare Other | Admitting: *Deleted

## 2017-02-07 DIAGNOSIS — I480 Paroxysmal atrial fibrillation: Secondary | ICD-10-CM | POA: Diagnosis not present

## 2017-02-07 DIAGNOSIS — Z7901 Long term (current) use of anticoagulants: Secondary | ICD-10-CM | POA: Diagnosis not present

## 2017-02-07 LAB — POCT INR: INR: 1.5

## 2017-02-07 NOTE — Patient Instructions (Signed)
Description   Since you are taking Levaquin and Prednisone, continue taking 1 tablet daily except 1.5 tablets on Sundays and Thursdays.  Recheck INR in 1 week.   Main [phone] Coumadin Clinic [phone]

## 2017-02-14 ENCOUNTER — Ambulatory Visit (INDEPENDENT_AMBULATORY_CARE_PROVIDER_SITE_OTHER): Payer: Medicare Other | Admitting: Pharmacist

## 2017-02-14 DIAGNOSIS — I480 Paroxysmal atrial fibrillation: Secondary | ICD-10-CM | POA: Diagnosis not present

## 2017-02-14 DIAGNOSIS — Z7901 Long term (current) use of anticoagulants: Secondary | ICD-10-CM | POA: Diagnosis not present

## 2017-02-14 LAB — POCT INR: INR: 1.6

## 2017-02-14 NOTE — Patient Instructions (Signed)
Take 1.5 tablets today and tomorrow, then continue taking 1 tablet daily except 1.5 tablets on Sundays and Thursdays.  Recheck INR next Thursday at 8:15am.   Main [phone] Coumadin Clinic [phone]

## 2017-02-23 ENCOUNTER — Telehealth: Payer: Self-pay

## 2017-02-23 NOTE — Telephone Encounter (Signed)
Pt called into clinic states he missed 1 dosage of Coumadin on Monday 02/21/17, his normal dosage of Warfarin is 1 tablet daily except 1.5 tablets on Sundays and Thursdays.  Pt's INR was 1.6 on 02/14/17, advised pt to take 2 tablets today, then resume previous dosage regimen.  Pt verbalized understanding. Pt states he went to pain medicine MD and he started pt on Gabapentin 130m Q8hr and Oxycodone 128mQ6 hrs, and put pain patch on hold. Made changes on Medication list to reflect above changes.

## 2017-02-24 ENCOUNTER — Ambulatory Visit (INDEPENDENT_AMBULATORY_CARE_PROVIDER_SITE_OTHER): Payer: Medicare Other | Admitting: *Deleted

## 2017-02-24 DIAGNOSIS — J9611 Chronic respiratory failure with hypoxia: Secondary | ICD-10-CM | POA: Insufficient documentation

## 2017-02-24 DIAGNOSIS — I480 Paroxysmal atrial fibrillation: Secondary | ICD-10-CM | POA: Diagnosis not present

## 2017-02-24 DIAGNOSIS — Z7901 Long term (current) use of anticoagulants: Secondary | ICD-10-CM | POA: Diagnosis not present

## 2017-02-24 HISTORY — DX: Chronic respiratory failure with hypoxia: J96.11

## 2017-02-24 LAB — POCT INR: INR: 1.6

## 2017-02-24 NOTE — Patient Instructions (Signed)
Description   Continue taking 1 tablet daily except 1.5 tablets on Sundays and Thursdays.  Recheck INR in 1 week.   Main [phone] Coumadin Clinic [phone]

## 2017-03-02 ENCOUNTER — Ambulatory Visit (INDEPENDENT_AMBULATORY_CARE_PROVIDER_SITE_OTHER): Payer: Medicare Other

## 2017-03-02 DIAGNOSIS — I480 Paroxysmal atrial fibrillation: Secondary | ICD-10-CM | POA: Diagnosis not present

## 2017-03-02 DIAGNOSIS — Z7901 Long term (current) use of anticoagulants: Secondary | ICD-10-CM

## 2017-03-02 LAB — POCT INR: INR: 1.7

## 2017-03-02 NOTE — Patient Instructions (Signed)
Description   Take 2 tablets today, then start taking 1 tablet daily except 1.5 tablets on Sundays, Tuesdays and Thursdays.  Recheck INR in 2 weeks.   Main [phone] Coumadin Clinic [phone]

## 2017-03-15 ENCOUNTER — Ambulatory Visit (INDEPENDENT_AMBULATORY_CARE_PROVIDER_SITE_OTHER): Payer: Medicare Other | Admitting: Pharmacist

## 2017-03-15 DIAGNOSIS — Z7901 Long term (current) use of anticoagulants: Secondary | ICD-10-CM

## 2017-03-15 DIAGNOSIS — I480 Paroxysmal atrial fibrillation: Secondary | ICD-10-CM | POA: Diagnosis not present

## 2017-03-15 LAB — POCT INR: INR: 2.2

## 2017-03-15 NOTE — Patient Instructions (Signed)
Description   Continue 1 tablet daily except 1.5 tablets on Sundays, Tuesdays and Thursdays.  Recheck INR in 3 weeks.   Main [phone] Coumadin Clinic [phone]

## 2017-04-05 ENCOUNTER — Ambulatory Visit (INDEPENDENT_AMBULATORY_CARE_PROVIDER_SITE_OTHER): Payer: Medicare Other | Admitting: *Deleted

## 2017-04-05 DIAGNOSIS — Z5181 Encounter for therapeutic drug level monitoring: Secondary | ICD-10-CM | POA: Diagnosis not present

## 2017-04-05 DIAGNOSIS — Z7901 Long term (current) use of anticoagulants: Secondary | ICD-10-CM

## 2017-04-05 DIAGNOSIS — I480 Paroxysmal atrial fibrillation: Secondary | ICD-10-CM | POA: Diagnosis not present

## 2017-04-05 LAB — POCT INR: INR: 4.9

## 2017-04-05 NOTE — Patient Instructions (Signed)
Description   Do not take coumadin today March 12th and no coumadin on March 13th then continue 1 tablet daily except 1.5 tablets on Sundays, Tuesdays and Thursdays.  Recheck INR in 1 week.   Main [phone] Coumadin Clinic [phone] Try to do serving of dark leafy greens today and tomorrow

## 2017-04-13 ENCOUNTER — Ambulatory Visit (INDEPENDENT_AMBULATORY_CARE_PROVIDER_SITE_OTHER): Payer: Medicare Other | Admitting: *Deleted

## 2017-04-13 DIAGNOSIS — Z5181 Encounter for therapeutic drug level monitoring: Secondary | ICD-10-CM

## 2017-04-13 DIAGNOSIS — I480 Paroxysmal atrial fibrillation: Secondary | ICD-10-CM

## 2017-04-13 DIAGNOSIS — Z7901 Long term (current) use of anticoagulants: Secondary | ICD-10-CM

## 2017-04-13 LAB — POCT INR: INR: 3.3

## 2017-04-13 NOTE — Patient Instructions (Signed)
Description   Do not take coumadin today March 20th then change dose of coumadin to  1 tablet daily except 1.5 tablets only on  Tuesdays and Thursdays.  Recheck INR in 2 weeks.   Main [phone] Coumadin Clinic [phone] Try to do 1 serving of dark leafy greens weekly

## 2017-04-29 ENCOUNTER — Ambulatory Visit (INDEPENDENT_AMBULATORY_CARE_PROVIDER_SITE_OTHER): Payer: Medicare Other | Admitting: *Deleted

## 2017-04-29 DIAGNOSIS — I480 Paroxysmal atrial fibrillation: Secondary | ICD-10-CM | POA: Diagnosis not present

## 2017-04-29 DIAGNOSIS — Z7901 Long term (current) use of anticoagulants: Secondary | ICD-10-CM | POA: Diagnosis not present

## 2017-04-29 DIAGNOSIS — Z5181 Encounter for therapeutic drug level monitoring: Secondary | ICD-10-CM | POA: Diagnosis not present

## 2017-04-29 LAB — POCT INR: INR: 4.5

## 2017-04-29 NOTE — Patient Instructions (Signed)
Description   Do not take Coumadin today and No Coumadin tomorrow then change dose of coumadin to  1 tablet daily except 1.5 tablets only on Thursdays.  Recheck INR in 2 weeks.   Main [phone] Coumadin Clinic [phone] Try to do 1 serving of dark leafy greens weekly

## 2017-05-13 ENCOUNTER — Encounter (INDEPENDENT_AMBULATORY_CARE_PROVIDER_SITE_OTHER): Payer: Self-pay

## 2017-05-13 ENCOUNTER — Ambulatory Visit (INDEPENDENT_AMBULATORY_CARE_PROVIDER_SITE_OTHER): Payer: Medicare Other | Admitting: Pharmacist

## 2017-05-13 DIAGNOSIS — Z7901 Long term (current) use of anticoagulants: Secondary | ICD-10-CM

## 2017-05-13 DIAGNOSIS — I480 Paroxysmal atrial fibrillation: Secondary | ICD-10-CM

## 2017-05-13 DIAGNOSIS — Z5181 Encounter for therapeutic drug level monitoring: Secondary | ICD-10-CM | POA: Diagnosis not present

## 2017-05-13 LAB — POCT INR: INR: 2.5

## 2017-05-13 NOTE — Patient Instructions (Signed)
Description   Continue 1 tablet daily except 1.5 tablets only on Thursdays.  Recheck INR in 3 weeks.   Main [phone] Coumadin Clinic [phone] Try to do 1 serving of dark leafy greens weekly

## 2017-05-18 ENCOUNTER — Telehealth: Payer: Self-pay | Admitting: *Deleted

## 2017-05-18 NOTE — Telephone Encounter (Signed)
Pt called stating that he went to pain Doctor today and was started on Prednisone 10 mg taper. Today take 6 tablets then tomorrow take 5 tablets then 4 tablets then 3 tablets then 2 tablets then 1 tablet Pt instructed there is an interaction between Prednisone and Coumadin and to take coumadin as ordered and Prednisone as ordered and made an appt for him to be seen in coumadin clinic on Friday and he states understanding

## 2017-05-20 ENCOUNTER — Ambulatory Visit: Payer: Medicare Other | Admitting: *Deleted

## 2017-05-20 DIAGNOSIS — Z5181 Encounter for therapeutic drug level monitoring: Secondary | ICD-10-CM

## 2017-05-20 DIAGNOSIS — I480 Paroxysmal atrial fibrillation: Secondary | ICD-10-CM

## 2017-05-20 DIAGNOSIS — Z7901 Long term (current) use of anticoagulants: Secondary | ICD-10-CM | POA: Diagnosis not present

## 2017-05-20 LAB — POCT INR: INR: 3.4

## 2017-05-20 NOTE — Patient Instructions (Signed)
Description   Skip today's dose, then Continue 1 tablet daily except 1.5 tablets only on Thursdays.  Recheck INR in 2 weeks.   Main [phone] [facility]in Clinic [phone].  Try to do 1 serving of dark leafy greens weekly

## 2017-06-02 ENCOUNTER — Other Ambulatory Visit: Payer: Self-pay | Admitting: Internal Medicine

## 2017-06-03 ENCOUNTER — Ambulatory Visit: Payer: Medicare Other | Admitting: *Deleted

## 2017-06-03 DIAGNOSIS — Z5181 Encounter for therapeutic drug level monitoring: Secondary | ICD-10-CM

## 2017-06-03 DIAGNOSIS — Z7901 Long term (current) use of anticoagulants: Secondary | ICD-10-CM

## 2017-06-03 DIAGNOSIS — I480 Paroxysmal atrial fibrillation: Secondary | ICD-10-CM

## 2017-06-03 LAB — POCT INR: INR: 2.6

## 2017-06-03 NOTE — Patient Instructions (Signed)
Description   Continue 1 tablet daily except 1.5 tablets only on Thursdays.  Recheck INR in 3-4  weeks.   Main [phone] [facility] [phone].  Try to do 1 serving of dark leafy greens weekly

## 2017-06-08 ENCOUNTER — Telehealth: Payer: Self-pay | Admitting: *Deleted

## 2017-06-08 NOTE — Telephone Encounter (Signed)
Pt states was ordered Meloxicam 7.55m bid for back pain and is asking regarding his taking this medication Instructed will not increase his INR but this medication is a nonsteroidal anti inflammatory medication and should not be taken for long period of time with his being on coumadin Instructed to take for 1 week and see if back pain is better and then to call his Doctor back to see if some other medication might be ordered if he needs to take this for long period of time Pt states understanding

## 2017-06-12 ENCOUNTER — Other Ambulatory Visit: Payer: Self-pay | Admitting: Cardiology

## 2017-06-27 ENCOUNTER — Ambulatory Visit: Payer: Medicare Other | Admitting: *Deleted

## 2017-06-27 ENCOUNTER — Encounter (INDEPENDENT_AMBULATORY_CARE_PROVIDER_SITE_OTHER): Payer: Self-pay

## 2017-06-27 DIAGNOSIS — Z7901 Long term (current) use of anticoagulants: Secondary | ICD-10-CM

## 2017-06-27 DIAGNOSIS — Z5181 Encounter for therapeutic drug level monitoring: Secondary | ICD-10-CM

## 2017-06-27 DIAGNOSIS — I480 Paroxysmal atrial fibrillation: Secondary | ICD-10-CM | POA: Diagnosis not present

## 2017-06-27 LAB — POCT INR: INR: 4.3 — AB (ref 2.0–3.0)

## 2017-06-27 NOTE — Patient Instructions (Signed)
Description   Skip today's dose, tomorrow only take 1/2 tablet, then Continue 1 tablet daily except 1.5 tablets only on Thursdays.  Recheck INR in 14 days.  Main [phone] [facility] [phone].  Try to do 1 serving of dark leafy greens weekly

## 2017-07-02 DIAGNOSIS — Z7901 Long term (current) use of anticoagulants: Secondary | ICD-10-CM | POA: Diagnosis not present

## 2017-07-02 DIAGNOSIS — N179 Acute kidney failure, unspecified: Secondary | ICD-10-CM

## 2017-07-02 DIAGNOSIS — K859 Acute pancreatitis without necrosis or infection, unspecified: Secondary | ICD-10-CM

## 2017-07-02 DIAGNOSIS — I4891 Unspecified atrial fibrillation: Secondary | ICD-10-CM | POA: Diagnosis not present

## 2017-07-02 DIAGNOSIS — K760 Fatty (change of) liver, not elsewhere classified: Secondary | ICD-10-CM | POA: Diagnosis not present

## 2017-07-02 DIAGNOSIS — Z86711 Personal history of pulmonary embolism: Secondary | ICD-10-CM | POA: Diagnosis not present

## 2017-07-02 DIAGNOSIS — K219 Gastro-esophageal reflux disease without esophagitis: Secondary | ICD-10-CM

## 2017-07-02 DIAGNOSIS — D4102 Neoplasm of uncertain behavior of left kidney: Secondary | ICD-10-CM

## 2017-07-03 DIAGNOSIS — I4891 Unspecified atrial fibrillation: Secondary | ICD-10-CM | POA: Diagnosis not present

## 2017-07-03 DIAGNOSIS — I10 Essential (primary) hypertension: Secondary | ICD-10-CM

## 2017-07-03 DIAGNOSIS — D4102 Neoplasm of uncertain behavior of left kidney: Secondary | ICD-10-CM | POA: Diagnosis not present

## 2017-07-03 DIAGNOSIS — K859 Acute pancreatitis without necrosis or infection, unspecified: Secondary | ICD-10-CM | POA: Diagnosis not present

## 2017-07-03 DIAGNOSIS — J449 Chronic obstructive pulmonary disease, unspecified: Secondary | ICD-10-CM

## 2017-07-04 DIAGNOSIS — J449 Chronic obstructive pulmonary disease, unspecified: Secondary | ICD-10-CM | POA: Diagnosis not present

## 2017-07-04 DIAGNOSIS — I4891 Unspecified atrial fibrillation: Secondary | ICD-10-CM | POA: Diagnosis not present

## 2017-07-04 DIAGNOSIS — K859 Acute pancreatitis without necrosis or infection, unspecified: Secondary | ICD-10-CM | POA: Diagnosis not present

## 2017-07-04 DIAGNOSIS — I10 Essential (primary) hypertension: Secondary | ICD-10-CM | POA: Diagnosis not present

## 2017-07-06 DIAGNOSIS — N2889 Other specified disorders of kidney and ureter: Secondary | ICD-10-CM

## 2017-07-06 HISTORY — DX: Other specified disorders of kidney and ureter: N28.89

## 2017-07-11 ENCOUNTER — Ambulatory Visit: Payer: Medicare Other | Admitting: *Deleted

## 2017-07-11 DIAGNOSIS — Z7901 Long term (current) use of anticoagulants: Secondary | ICD-10-CM | POA: Diagnosis not present

## 2017-07-11 DIAGNOSIS — Z5181 Encounter for therapeutic drug level monitoring: Secondary | ICD-10-CM

## 2017-07-11 DIAGNOSIS — I480 Paroxysmal atrial fibrillation: Secondary | ICD-10-CM | POA: Diagnosis not present

## 2017-07-11 LAB — POCT INR: INR: 3.2 — AB (ref 2.0–3.0)

## 2017-07-11 NOTE — Patient Instructions (Signed)
Description   Today take 1/2 tablet then start taking 1 tablet daily.  Recheck INR in 2 weeks.  Main [phone] [facility] [phone].  Try to do 1 serving of dark leafy greens weekly

## 2017-07-25 ENCOUNTER — Ambulatory Visit: Payer: Medicare Other | Admitting: *Deleted

## 2017-07-25 DIAGNOSIS — Z5181 Encounter for therapeutic drug level monitoring: Secondary | ICD-10-CM

## 2017-07-25 DIAGNOSIS — Z7901 Long term (current) use of anticoagulants: Secondary | ICD-10-CM | POA: Diagnosis not present

## 2017-07-25 DIAGNOSIS — I480 Paroxysmal atrial fibrillation: Secondary | ICD-10-CM

## 2017-07-25 LAB — POCT INR: INR: 3.5 — AB (ref 2.0–3.0)

## 2017-07-25 NOTE — Patient Instructions (Signed)
Description   Hold today's dose then start taking 1 tablet daily except 1/2 tablet on Thursdays.  Recheck INR in 2 weeks.  Main [phone] [facility] [phone].  Try to do 1 serving of dark leafy greens weekly

## 2017-08-03 ENCOUNTER — Encounter: Payer: Self-pay | Admitting: Physician Assistant

## 2017-08-03 DIAGNOSIS — K859 Acute pancreatitis without necrosis or infection, unspecified: Secondary | ICD-10-CM

## 2017-08-03 DIAGNOSIS — N179 Acute kidney failure, unspecified: Secondary | ICD-10-CM | POA: Diagnosis not present

## 2017-08-03 DIAGNOSIS — I2510 Atherosclerotic heart disease of native coronary artery without angina pectoris: Secondary | ICD-10-CM

## 2017-08-03 DIAGNOSIS — Z86711 Personal history of pulmonary embolism: Secondary | ICD-10-CM

## 2017-08-03 DIAGNOSIS — K219 Gastro-esophageal reflux disease without esophagitis: Secondary | ICD-10-CM

## 2017-08-03 DIAGNOSIS — I10 Essential (primary) hypertension: Secondary | ICD-10-CM

## 2017-08-03 DIAGNOSIS — D696 Thrombocytopenia, unspecified: Secondary | ICD-10-CM

## 2017-08-03 DIAGNOSIS — Z7901 Long term (current) use of anticoagulants: Secondary | ICD-10-CM | POA: Diagnosis not present

## 2017-08-04 DIAGNOSIS — N179 Acute kidney failure, unspecified: Secondary | ICD-10-CM | POA: Diagnosis not present

## 2017-08-04 DIAGNOSIS — Z7901 Long term (current) use of anticoagulants: Secondary | ICD-10-CM | POA: Diagnosis not present

## 2017-08-04 DIAGNOSIS — K219 Gastro-esophageal reflux disease without esophagitis: Secondary | ICD-10-CM | POA: Diagnosis not present

## 2017-08-04 DIAGNOSIS — K859 Acute pancreatitis without necrosis or infection, unspecified: Secondary | ICD-10-CM | POA: Diagnosis not present

## 2017-08-05 ENCOUNTER — Encounter: Payer: Self-pay | Admitting: Physician Assistant

## 2017-08-05 DIAGNOSIS — N179 Acute kidney failure, unspecified: Secondary | ICD-10-CM | POA: Diagnosis not present

## 2017-08-05 DIAGNOSIS — K219 Gastro-esophageal reflux disease without esophagitis: Secondary | ICD-10-CM | POA: Diagnosis not present

## 2017-08-05 DIAGNOSIS — K859 Acute pancreatitis without necrosis or infection, unspecified: Secondary | ICD-10-CM | POA: Diagnosis not present

## 2017-08-05 DIAGNOSIS — Z7901 Long term (current) use of anticoagulants: Secondary | ICD-10-CM | POA: Diagnosis not present

## 2017-08-06 DIAGNOSIS — K859 Acute pancreatitis without necrosis or infection, unspecified: Secondary | ICD-10-CM | POA: Diagnosis not present

## 2017-08-06 DIAGNOSIS — K219 Gastro-esophageal reflux disease without esophagitis: Secondary | ICD-10-CM | POA: Diagnosis not present

## 2017-08-06 DIAGNOSIS — N179 Acute kidney failure, unspecified: Secondary | ICD-10-CM | POA: Diagnosis not present

## 2017-08-06 DIAGNOSIS — Z7901 Long term (current) use of anticoagulants: Secondary | ICD-10-CM | POA: Diagnosis not present

## 2017-08-07 DIAGNOSIS — K859 Acute pancreatitis without necrosis or infection, unspecified: Secondary | ICD-10-CM | POA: Diagnosis not present

## 2017-08-07 DIAGNOSIS — Z7901 Long term (current) use of anticoagulants: Secondary | ICD-10-CM | POA: Diagnosis not present

## 2017-08-07 DIAGNOSIS — N179 Acute kidney failure, unspecified: Secondary | ICD-10-CM | POA: Diagnosis not present

## 2017-08-07 DIAGNOSIS — K219 Gastro-esophageal reflux disease without esophagitis: Secondary | ICD-10-CM | POA: Diagnosis not present

## 2017-08-08 ENCOUNTER — Encounter: Payer: Self-pay | Admitting: Physician Assistant

## 2017-08-09 ENCOUNTER — Ambulatory Visit: Payer: Medicare Other

## 2017-08-09 DIAGNOSIS — Z5181 Encounter for therapeutic drug level monitoring: Secondary | ICD-10-CM

## 2017-08-09 DIAGNOSIS — Z7901 Long term (current) use of anticoagulants: Secondary | ICD-10-CM

## 2017-08-09 DIAGNOSIS — I480 Paroxysmal atrial fibrillation: Secondary | ICD-10-CM

## 2017-08-09 LAB — POCT INR: INR: 4.2 — AB (ref 2.0–3.0)

## 2017-08-09 NOTE — Patient Instructions (Signed)
Description   Hold today's dosage of Coumadin, then take 1/2 tablet tomorrow, then start taking 1 tablet daily except 1/2 tablet on Sundays and Thursdays.  Recheck INR in 10 days.  Main [phone] [facility] [phone].

## 2017-08-19 ENCOUNTER — Ambulatory Visit: Payer: Medicare Other | Admitting: Pharmacist

## 2017-08-19 DIAGNOSIS — I480 Paroxysmal atrial fibrillation: Secondary | ICD-10-CM | POA: Diagnosis not present

## 2017-08-19 DIAGNOSIS — Z5181 Encounter for therapeutic drug level monitoring: Secondary | ICD-10-CM | POA: Diagnosis not present

## 2017-08-19 DIAGNOSIS — Z7901 Long term (current) use of anticoagulants: Secondary | ICD-10-CM | POA: Diagnosis not present

## 2017-08-19 LAB — POCT INR: INR: 3.5 — AB (ref 2.0–3.0)

## 2017-08-19 NOTE — Patient Instructions (Signed)
Description   Hold today's dosage of Coumadin, then start taking 1 tablet daily except 1/2 tablet on Sundays, Tuesdays, and Thursdays. Call clinic when you know your prednisone dose you're starting.  Recheck INR in 1 week.  Main [phone] [facility] [phone].

## 2017-08-25 ENCOUNTER — Encounter: Payer: Self-pay | Admitting: Physician Assistant

## 2017-08-25 ENCOUNTER — Ambulatory Visit: Payer: Medicare Other | Admitting: *Deleted

## 2017-08-25 ENCOUNTER — Ambulatory Visit: Payer: Medicare Other | Admitting: Physician Assistant

## 2017-08-25 ENCOUNTER — Other Ambulatory Visit (INDEPENDENT_AMBULATORY_CARE_PROVIDER_SITE_OTHER): Payer: Medicare Other

## 2017-08-25 VITALS — BP 94/60 | HR 68 | Ht 71.5 in | Wt 251.2 lb

## 2017-08-25 DIAGNOSIS — E781 Pure hyperglyceridemia: Secondary | ICD-10-CM

## 2017-08-25 DIAGNOSIS — K859 Acute pancreatitis without necrosis or infection, unspecified: Secondary | ICD-10-CM

## 2017-08-25 DIAGNOSIS — Z5181 Encounter for therapeutic drug level monitoring: Secondary | ICD-10-CM

## 2017-08-25 DIAGNOSIS — IMO0002 Reserved for concepts with insufficient information to code with codable children: Secondary | ICD-10-CM

## 2017-08-25 DIAGNOSIS — Z7901 Long term (current) use of anticoagulants: Secondary | ICD-10-CM | POA: Diagnosis not present

## 2017-08-25 DIAGNOSIS — I480 Paroxysmal atrial fibrillation: Secondary | ICD-10-CM | POA: Diagnosis not present

## 2017-08-25 LAB — POCT INR: INR: 1.9 — AB (ref 2.0–3.0)

## 2017-08-25 LAB — TRIGLYCERIDES: Triglycerides: 470 mg/dL — ABNORMAL HIGH (ref 0.0–149.0)

## 2017-08-25 NOTE — Progress Notes (Signed)
Subjective:    Patient ID: Steve Anthony, male    DOB: 05-15-1950, 67 y.o.   MRN: [id number]  HPI Steve Anthony is a 67 year old white male, known remotely to Dr. Lyndel Safe who is referred today by [name] for follow-up of recurrent pancreatitis.  Patient has had 2 recent hospitalizations, one in June and again in July 2019 for 6 days for acute pancreatitis. He relates having another episode of pancreatitis in 2015 and had a prolonged hospitalization in 2012. He states he last saw Dr. Lyndel Safe 8 or 9 years ago, for colonoscopy. Patient has history of hypertension, coronary artery disease status post prior stent and maintained on Coumadin, atrial fibrillation, COPD, sleep apnea, GERD, and stage III chronic kidney disease. By review of available previous records etiology of pancreatitis has not been clear.  Patient is status post cholecystectomy, he does not drink alcohol..  Patient had remote MRCP done in 2012 with no evidence of gallstones or choledocholithiasis.  Was felt to have steatohepatitis at that time.  Endoscopic ultrasound was mentioned in 2012 but patient says to his knowledge she has not had that done. He has had significant hypertriglyceridemia.  Triglyceride level was 711 and March 2019.  He is currently on Zocor 40 mg daily. Do not have his hospital records from [facility] at the time of this office visit but those have been requested. He says he had been eating very unhealthfully prior to these last episodes and was consuming a lot of fatty foods and fast foods.  He is being very careful with his diet since discharge from the hospital.  He has some mild residual discomfort but says he is not having any significant pain.  He has been eating without difficulty no nausea or vomiting.  No fevers or chills.  Remote colonoscopy November 2009 Dr. Lyndel Safe multiple sigmoid diverticuli one 4 mm sessile polyp in the cecum was removed and noted small internal hemorrhoids as well as  pandiverticulosis.  Path on the polyp consistent with unremarkable colonic mucosal lymphoid aggregate.  Review of Systems Pertinent positive and negative review of systems were noted in the above HPI section.  All other review of systems was otherwise negative.  Outpatient Encounter Medications as of 08/25/2017  Medication Sig  . albuterol-ipratropium (COMBIVENT) 18-103 MCG/ACT inhaler Inhale 2 puffs into the lungs every 4 (four) hours as needed.  Marland Kitchen aspirin EC 81 MG EC tablet Take 1 tablet (81 mg total) by mouth daily.  Marland Kitchen atorvastatin (LIPITOR) 40 MG tablet Take 40 mg by mouth at bedtime.   . Cyanocobalamin (VITAMIN B-12 IJ) Inject 1,000 mcg as directed every 30 (thirty) days.  Marland Kitchen docusate sodium (COLACE) 100 MG capsule Take 100 mg by mouth daily.  Marland Kitchen escitalopram (LEXAPRO) 10 MG tablet Take 10 mg by mouth daily.  . fenofibrate 160 MG tablet Take 160 mg by mouth daily.    . ferrous sulfate 325 (65 FE) MG tablet Take 325 mg by mouth daily with breakfast.  . furosemide (LASIX) 20 MG tablet Take 1 tablet by mouth 2 (two) times daily.  Marland Kitchen gabapentin (NEURONTIN) 100 MG capsule Take 100 mg by mouth 3 (three) times daily.  . Melatonin 10 MG TABS Take 1 tablet by mouth at bedtime.  . metoprolol succinate (TOPROL-XL) 50 MG 24 hr tablet TAKE 1 TABLET BY MOUTH TWICE A DAY  . nitroGLYCERIN (NITROSTAT) 0.4 MG SL tablet Place 1 tablet (0.4 mg total) under the tongue every 5 (five) minutes x 3 doses as needed for  chest pain.  Marland Kitchen oxyCODONE-acetaminophen (PERCOCET) 10-325 MG tablet Take 1 tablet by mouth every 6 (six) hours as needed for pain.  . pantoprazole (PROTONIX) 40 MG tablet Take 40 mg by mouth daily.    Marland Kitchen spironolactone (ALDACTONE) 25 MG tablet Take 25 mg by mouth daily.  . Tamsulosin HCl (FLOMAX) 0.4 MG CAPS Take 0.4 mg by mouth daily.    . traZODone (DESYREL) 50 MG tablet Take 1 tablet by mouth at bedtime.  Marland Kitchen warfarin (COUMADIN) 5 MG tablet TAKE AS DIRECTED BY COUMADIN CLINIC  . [DISCONTINUED]  amLODipine (NORVASC) 5 MG tablet TAKE 1 TABLET (5 MG TOTAL) BY MOUTH DAILY.  . [DISCONTINUED] clonazePAM (KLONOPIN) 1 MG tablet Take 1 mg by mouth at bedtime.  . [DISCONTINUED] cyanocobalamin 1000 MCG tablet Take 1,000 mcg by mouth daily.   . [DISCONTINUED] Ferrous Fumarate 63 (20 Fe) MG TABS Take 1 tablet by mouth daily.  . [DISCONTINUED] HYDROcodone-acetaminophen (NORCO) 7.5-325 MG tablet Take 1-2 tablets by mouth every 6 (six) hours as needed for moderate pain.   . [DISCONTINUED] losartan (COZAAR) 50 MG tablet Take 100 mg by mouth daily.   . [DISCONTINUED] Melatonin 5 MG TABS Take 5 mg by mouth at bedtime.   No facility-administered encounter medications on file as of 08/25/2017.    Allergies  Allergen Reactions  . Penicillins Hives and Shortness Of Breath    Has patient had a PCN reaction causing immediate rash, facial/tongue/throat swelling, SOB or lightheadedness with hypotension: Yes Has patient had a PCN reaction causing severe rash involving mucus membranes or skin necrosis: No Has patient had a PCN reaction that required hospitalization: Yes Has patient had a PCN reaction occurring within the last 10 years: No If all of the above answers are "NO", then may proceed with Cephalosporin use.    Patient Active Problem List   Diagnosis Date Noted  . Chest pain 12/16/2016  . Pure hypercholesterolemia   . S/P coronary artery stent placement   . Long term (current) use of anticoagulants [Z79.01] 06/10/2015  . OA (osteoarthritis) of hip 12/18/2014  . Coronary atherosclerosis 12/31/2009  . Sleep apnea 05/17/2009  . C O P D 05/15/2009  . SHORTNESS OF BREATH 03/19/2009  . Atrial fibrillation ([place]) 11/21/2008  . CHEST PAIN UNSPECIFIED 11/21/2008  . HYPERTRIGLYCERIDEMIA 10/15/2008  . DYSLIPIDEMIA 10/15/2008  . Essential hypertension 10/15/2008  . Unstable angina ([place]) 10/15/2008  . GERD 10/15/2008  . NEPHROLITHIASIS 10/15/2008  . PALPITATIONS 10/15/2008   Social History    Socioeconomic History  . Marital status: Married    Spouse name: Not on file  . Number of children: 1  . Years of education: Not on file  . Highest education level: Not on file  Occupational History  . Occupation: DISABLED    Employer: DISABLED  . 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 Financial resource strain: Not on file  . Food insecurity:    Worry: Not on file    Inability: Not on file  . Transportation needs:    Medical: Not on file    Non-medical: Not on file  Tobacco Use  . Smoking status: Former Smoker    Packs/day: 3.00    Years: 38.00    Pack years: 114.00    Types: Cigarettes    Last attempt to quit: 03/25/2004    Years since quitting: 13.4  . Smokeless tobacco: Former Systems developer    Types: [place] date: 04/25/2004  Substance and Sexual Activity  . Alcohol  use: Yes    Comment: 11/08/2012 "quit drinking in 1989"  . Drug use: No  . Sexual activity: Not Currently  Lifestyle  . Physical activity:    Days per week: Not on file    Minutes per session: Not on file  . Stress: Not on file  Relationships  . Social connections:    Talks on phone: Not on file    Gets together: Not on file    Attends religious service: Not on file    Active member of club or organization: Not on file    Attends meetings of clubs or organizations: Not on file    Relationship status: Not on file  . Intimate partner violence:    Fear of current or ex partner: Not on file    Emotionally abused: Not on file    Physically abused: Not on file    Forced sexual activity: Not on file  Other Topics Concern  . Not on file  Social History Narrative   Resides in [place] with his wife   Two children and two grandchildren   Attends [employer]    Mr. Wix family history includes Diabetes in his father; Emphysema in his maternal grandfather and mother; Heart attack in his father; Heart disease in his father and paternal grandfather; Hypertension in his  father.      Objective:    Vitals:   08/25/17 1013  BP: 94/60  Pulse: 68    Physical Exam; older white male in no acute distress, pleasant blood pressure 94/60, pulse 68, height 5 foot 11, weight 251, BMI 34.5.  HEENT; nontraumatic normocephalic EOMI PERRLA sclera anicteric, Oropharynx benign, Neck supple, Cardiovascular ;regular rate and rhythm with S1-S2 no murmur rub or gallop, Pulmonary ;somewhat decreased breath sounds bilaterally, Abdomen ;soft, nondistended bowel sounds are present, there is no palpable mass or hepatosplenomegaly he does have some tenderness across the upper abdomen no guarding.  Rectal ;exam not done, Extremities; no clubbing cyanosis or edema skin warm and dry, Neuro psych ;alert and oriented, grossly nonfocal mood and affect appropriate       Assessment & Plan:   #3 67 year old white male with history of recurrent pancreatitis of unclear etiology. Patient has had 2 recent hospitalizations in [place] with acute pancreatitis. He is referred to reestablish with gastroenterology. Patient has been stable since discharge from the hospital 2 weeks ago.  He is status post remote cholecystectomy, no EtOH use, MRCP 2012-  He has had significant hypertriglyceridemia despite statin use with documented triglyceride level at 711 in March 2019. Need to consider hypertriglyceridemia as etiology/culprit for precipitating episodes of pancreatitis. Need to review his recent imaging and hospital records  #2 chronic kidney disease stage III #3.  Sleep apnea #4.  COPD #5.  Chronic atrial fibrillation-on Coumadin #6.  Coronary artery disease status post stent #7.  Hypertension #8 fatty liver  #9  Colon cancer surveillance-last colonoscopy 2009-.  Patient says he will not have another colonoscopy #10 pandiverticulosis  Plan; have requested records from recent hospitalizations at [facility]. Discussed importance of maintaining low-fat diet and complete avoidance of  alcohol. Check triglyceride level today Plan office follow-up with Dr. Lyndel Safe in 4 to 6 weeks.  May need to consider repeat MRI/MRCP.    Luster Hechler S Ziaire Bieser PA-C 08/25/2017   Cc: [name], MD

## 2017-08-25 NOTE — Patient Instructions (Signed)
Description   Today take 1 tablet, then change your dose to 1 tablet daily except 1/2 tablet on Sundays and Thursdays. Due to eating more leafy green vegetables.  Recheck INR in 2 weeks.  Main [phone] Coumadin Clinic [phone].

## 2017-08-25 NOTE — Patient Instructions (Signed)
Your provider has requested that you go to the basement level for lab work before leaving today. Press "B" on the elevator. The lab is located at the first door on the left as you exit the elevator.  We have provided you with a Low Fat Diet. Follow up with Dr. Lyndel Safe in the [place] office, [facility] [place] on 10-04-2017 at 10:45 AM.  If you are age 66 or older, your body mass index should be between 23-30. Your Body mass index is 34.55 kg/m. If this is out of the aforementioned range listed, please consider follow up with your Primary Care Provider.  Marland Kitchen

## 2017-09-02 ENCOUNTER — Telehealth: Payer: Self-pay | Admitting: *Deleted

## 2017-09-02 NOTE — Telephone Encounter (Signed)
Records we requested were received from [facility]. Stamped to be scanned on 09-02-2017.

## 2017-09-07 ENCOUNTER — Telehealth: Payer: Self-pay | Admitting: Internal Medicine

## 2017-09-07 NOTE — Telephone Encounter (Signed)
New Message    Kathy-registered nurse with [facility] care is calling to report the pt weight, they monitored dailey and for the past 3 weights he has had a  5.4 wright loss. Juliann Pulse says she will send over a copy of everything via fax, pt is not having any symptoms. If any concerns please reach out to the pt direct.

## 2017-09-08 ENCOUNTER — Ambulatory Visit: Payer: Medicare Other

## 2017-09-08 DIAGNOSIS — I480 Paroxysmal atrial fibrillation: Secondary | ICD-10-CM

## 2017-09-08 DIAGNOSIS — Z7901 Long term (current) use of anticoagulants: Secondary | ICD-10-CM | POA: Diagnosis not present

## 2017-09-08 DIAGNOSIS — Z5181 Encounter for therapeutic drug level monitoring: Secondary | ICD-10-CM

## 2017-09-08 LAB — POCT INR: INR: 3.7 — AB (ref 2.0–3.0)

## 2017-09-08 NOTE — Telephone Encounter (Signed)
Pt at office for Coumadin check.  Pt weighs 244.4 pounds today.  Per Pt he has lost some "water weight".  Pt feels good.  No action needed at this time.

## 2017-09-08 NOTE — Patient Instructions (Signed)
Description   Hold today's dose (1/2 tab, 2.6m). Then continue to take 1 tablet daily except 1/2 tablet on Sundays and Thursdays. Recheck INR in 2 weeks.  Main #[phone] Clinic [phone]

## 2017-09-21 ENCOUNTER — Encounter: Payer: Self-pay | Admitting: Gastroenterology

## 2017-09-22 DIAGNOSIS — K219 Gastro-esophageal reflux disease without esophagitis: Secondary | ICD-10-CM

## 2017-09-22 DIAGNOSIS — N183 Chronic kidney disease, stage 3 (moderate): Secondary | ICD-10-CM

## 2017-09-22 DIAGNOSIS — E669 Obesity, unspecified: Secondary | ICD-10-CM

## 2017-09-22 DIAGNOSIS — K863 Pseudocyst of pancreas: Secondary | ICD-10-CM

## 2017-09-22 DIAGNOSIS — J449 Chronic obstructive pulmonary disease, unspecified: Secondary | ICD-10-CM

## 2017-09-22 DIAGNOSIS — I5032 Chronic diastolic (congestive) heart failure: Secondary | ICD-10-CM | POA: Diagnosis not present

## 2017-09-22 DIAGNOSIS — E119 Type 2 diabetes mellitus without complications: Secondary | ICD-10-CM

## 2017-09-22 DIAGNOSIS — R509 Fever, unspecified: Secondary | ICD-10-CM | POA: Diagnosis not present

## 2017-09-22 DIAGNOSIS — Z86711 Personal history of pulmonary embolism: Secondary | ICD-10-CM

## 2017-09-22 DIAGNOSIS — I2510 Atherosclerotic heart disease of native coronary artery without angina pectoris: Secondary | ICD-10-CM

## 2017-09-22 DIAGNOSIS — D696 Thrombocytopenia, unspecified: Secondary | ICD-10-CM

## 2017-09-22 DIAGNOSIS — R8290 Unspecified abnormal findings in urine: Secondary | ICD-10-CM

## 2017-09-22 DIAGNOSIS — I4891 Unspecified atrial fibrillation: Secondary | ICD-10-CM

## 2017-09-22 DIAGNOSIS — Z9119 Patient's noncompliance with other medical treatment and regimen: Secondary | ICD-10-CM | POA: Diagnosis not present

## 2017-09-23 DIAGNOSIS — R509 Fever, unspecified: Secondary | ICD-10-CM | POA: Diagnosis not present

## 2017-09-23 DIAGNOSIS — N183 Chronic kidney disease, stage 3 (moderate): Secondary | ICD-10-CM | POA: Diagnosis not present

## 2017-09-23 DIAGNOSIS — I5032 Chronic diastolic (congestive) heart failure: Secondary | ICD-10-CM | POA: Diagnosis not present

## 2017-09-23 DIAGNOSIS — Z9119 Patient's noncompliance with other medical treatment and regimen: Secondary | ICD-10-CM | POA: Diagnosis not present

## 2017-09-24 DIAGNOSIS — N183 Chronic kidney disease, stage 3 (moderate): Secondary | ICD-10-CM | POA: Diagnosis not present

## 2017-09-24 DIAGNOSIS — I5032 Chronic diastolic (congestive) heart failure: Secondary | ICD-10-CM | POA: Diagnosis not present

## 2017-09-24 DIAGNOSIS — R509 Fever, unspecified: Secondary | ICD-10-CM | POA: Diagnosis not present

## 2017-09-24 DIAGNOSIS — Z9119 Patient's noncompliance with other medical treatment and regimen: Secondary | ICD-10-CM | POA: Diagnosis not present

## 2017-10-03 ENCOUNTER — Ambulatory Visit: Payer: Medicare Other | Admitting: *Deleted

## 2017-10-03 DIAGNOSIS — Z7901 Long term (current) use of anticoagulants: Secondary | ICD-10-CM

## 2017-10-03 DIAGNOSIS — Z5181 Encounter for therapeutic drug level monitoring: Secondary | ICD-10-CM | POA: Diagnosis not present

## 2017-10-03 DIAGNOSIS — I480 Paroxysmal atrial fibrillation: Secondary | ICD-10-CM | POA: Diagnosis not present

## 2017-10-03 LAB — POCT INR: INR: 3.2 — AB (ref 2.0–3.0)

## 2017-10-03 NOTE — Patient Instructions (Addendum)
Description   Today take 1/2 tablet then start taking 1 tablet daily except 1/2 tablet on Sundays, Tuesdays, and Thursdays. Recheck INR in 2 weeks.  Main [phone] Cou[facility]in Clinic [phone].

## 2017-10-04 ENCOUNTER — Ambulatory Visit (INDEPENDENT_AMBULATORY_CARE_PROVIDER_SITE_OTHER): Payer: Medicare Other | Admitting: Gastroenterology

## 2017-10-04 ENCOUNTER — Encounter: Payer: Self-pay | Admitting: Gastroenterology

## 2017-10-04 VITALS — BP 110/68 | HR 69 | Ht 71.5 in | Wt 246.2 lb

## 2017-10-04 DIAGNOSIS — Z8719 Personal history of other diseases of the digestive system: Secondary | ICD-10-CM

## 2017-10-04 DIAGNOSIS — K863 Pseudocyst of pancreas: Secondary | ICD-10-CM

## 2017-10-04 NOTE — Patient Instructions (Addendum)
If you are age 66 or older, your body mass index should be between 23-30. Your Body mass index is 33.87 kg/m. If this is out of the aforementioned range listed, please consider follow up with your Primary Care Provider.  If you are age 73 or younger, your body mass index should be between 19-25. Your Body mass index is 33.87 kg/m. If this is out of the aformentioned range listed, please consider follow up with your Primary Care Provider.  You will need a follow up appointment in 12 weeks pleas call the office to schedule that appointment.   Thank you,  Dr. Jackquline Denmark

## 2017-10-04 NOTE — Progress Notes (Signed)
Chief Complaint: Abnormal CT urogram showing pancreatic pseudocyst  Referring Provider:  [name], MD      ASSESSMENT AND PLAN;   #1. H/O recurrent acute pancreatitis (adm RH June and July 2019, 08/2010)  now with asymptomatic pancreatic tail pseudocyst 7 x 9 cm (on CT urogram 09/22/2017). H/O remote ETOH abuse, none since 1989. S/P chole in past. H/O hypertriglyceridemia. Nl LFTs 08/2017, most recent TG 278 (09/30/2017), Nl lipase.  No clinical evidence of exocrine insufficiency or pancreatic calcification on noncontrast CT  #2.  Multiple comorbid conditions including A. fib on Coumadin, COPD on home oxygen, sleep apnea, stage III CKD (Cr 1.59 09/2017), CAD, dCHF, DM2, obesity, history of remote PE.  #3. Moderate splenomegaly (on CT) with mild thrombocytopenia (no definite evidence of liver cirrhosis but does have fatty liver)  #4.  Admission due to Klebsiella urosepsis due to nephrolithiasis 08/2017, currently on cephalexin.  Plan: -Since patient is completely asymptomatic, would recommend conservative management for pancreatic pseudocyst.  He is to follow-up in 12 weeks.  At that time we will repeat CT scan of the abdomen and pelvis preferably with p.o. and IV contrast (if Cr is better). He wants to hold off on EUS at this time. I do agree. -I have advised him to make appointment with Dr. Nila Nephew (urology) as recommended by Dr. Bea Graff. -He will continue taking fenofibrate. -Continue Protonix for now -Recommend best possible control for diabetes (he will FU with Dr Bea Graff for that, HBA1c>8 09/2017))   HPI:    Steve Anthony is a 67 y.o. male  Recently discharged from [facility] due to Klebsiella urosepsis, currently finishing cephalexin. Has left kidney stone with mild hydronephrosis. Sent to the GI clinic since CT scan showed large 7 into 9 cm pancreatic pseudocyst in the tail of the pancreas. Patient denies having any significant GI complaints except for mild early  satiety No nausea, vomiting, abdominal pain, significant heartburn (as long as he takes Protonix), diarrhea, constipation, fever or chills. He has been compliant with medications Has stopped all fried foods Most recent triglyceride level was 278. Has been able to lose 25 pounds over the last 3 months Not taking any medications for diabetes Denies having any significant diarrhea.  He had constipation for which he takes occasional stool softeners. Was given pancreatic enzymes in the hospital and then stopped   Past Medical History:  Diagnosis Date  . Arthritis   . CAD (coronary artery disease)    a. S/P PCI mid LAD (vision stent) 08/31/2004;  b. repeat cath 6/11 - no progression of CAD. c. Cath 10/2012 given moderate CAD on cardiac CT - no change from prior cath.  . Chest pain 12/16/2016  . COPD (chronic obstructive pulmonary disease) ([place])   . DJD (degenerative joint disease) of lumbar spine   . Dyslipidemia   . Gallstones   . GERD (gastroesophageal reflux disease)   . Hypertension   . Hyperthyroidism   . Melanoma ([place])    melanoma removed from neck   . Nephrolithiasis   . On home oxygen therapy    "2L q hs" (11/08/2012)  . OSA (obstructive sleep apnea)    a. cpap noncompliance- patient reports on 12/10/14- does not use CPAP  . Palpitations    a. 10/2012 - s/p LINQ loop recorder to assess for arrhythmia.   . Paroxysmal A-fib ([place])    a. S/P PVI 2/11 and 9/11  . SVT (supraventricular tachycardia) ([place])    a. Atypical AVNRT of  the slow pathway  . Type II diabetes mellitus ([place])     Past Surgical History:  Procedure Laterality Date  . ATRIAL ABLATION SURGERY  2007   S/P slow pathway ablation for atypical AVNRT   . CHOLECYSTECTOMY  ~ 2008  . COLON RESECTION  02/2013   . COLONOSCOPY  12/25/2007   Colonic polyp status post polypectomy. Internal hemorrhoids.   . CORONARY ANGIOPLASTY WITH STENT PLACEMENT  08/30/2004   Vision; to LAD   . CYSTOSCOPY W/ STONE MANIPULATION  ~ 1998    "once" (11/08/2012)  . EP Study  2008   Negative EP study in [place]  . FRACTURE SURGERY    . LEFT HEART CATHETERIZATION WITH CORONARY ANGIOGRAM N/A 11/10/2012   Procedure: LEFT HEART CATHETERIZATION WITH CORONARY ANGIOGRAM;  Surgeon: Burnell Blanks, MD;  Location: [facility] CATH LAB;  Service: Cardiovascular;  Laterality: N/A;  . LOOP RECORDER IMPLANT N/A 11/10/2012   Medtronic LinQ implanted by Dr Rayann Heman for afib management  . [place] SURGERY  2000; 2002  . POSTERIOR LUMBAR FUSION  2004  . PROSTATE SURGERY  ~ 2009  . PVI/ CTI ablation  02/25/2009 and 09/2009   S/P afib/ CTI ablation  . TIBIA FRACTURE SURGERY Right 1990   "broke in 3 places; had rod put in" (11/08/2012)  . TIBIA HARDWARE REMOVAL Right ~ 1991  . TONSILLECTOMY  1965  . TOTAL HIP ARTHROPLASTY Right 12/18/2014   Procedure: RIGHT TOTAL HIP ARTHROPLASTY ANTERIOR APPROACH;  Surgeon: Gaynelle Arabian, MD;  Location: [facility] ORS;  Service: Orthopedics;  Laterality: Right;    Family History  Problem Relation Age of Onset  . Emphysema Mother   . Heart attack Father        CVA  . Hypertension Father   . Diabetes Father   . Heart disease Father        MI  . Emphysema Maternal Grandfather        Smoker  . Heart disease Paternal Grandfather   . Colon cancer Neg Hx     Social History   Tobacco Use  . Smoking status: Former Smoker    Packs/day: 3.00    Years: 38.00    Pack years: 114.00    Types: Cigarettes    Last attempt to quit: 03/25/2004    Years since quitting: 13.5  . Smokeless tobacco: Former Systems developer    Types: Chew    Quit date: 04/25/2004  Substance Use Topics  . Alcohol use: Yes    Comment: 11/08/2012 "quit drinking in 1989"  . Drug use: No    Current Outpatient Medications  Medication Sig Dispense Refill  . atorvastatin (LIPITOR) 40 MG tablet Take 40 mg by mouth at bedtime.     . Cyanocobalamin (VITAMIN B-12 IJ) Inject 1,000 mcg as directed every 30 (thirty) days.    Marland Kitchen docusate sodium (COLACE) 100 MG  capsule Take 100 mg by mouth daily.    Marland Kitchen escitalopram (LEXAPRO) 10 MG tablet Take 10 mg by mouth daily.  1  . fenofibrate 160 MG tablet Take 160 mg by mouth daily.      . ferrous sulfate 325 (65 FE) MG tablet Take 325 mg by mouth daily with breakfast.    . furosemide (LASIX) 20 MG tablet Take 40 mg by mouth daily.     Marland Kitchen gabapentin (NEURONTIN) 100 MG capsule Take 100 mg by mouth 3 (three) times daily.    . Melatonin 10 MG TABS Take 1 tablet by mouth at bedtime.    Marland Kitchen  metoprolol succinate (TOPROL-XL) 50 MG 24 hr tablet TAKE 1 TABLET BY MOUTH TWICE A DAY 60 tablet 3  . oxyCODONE-acetaminophen (PERCOCET) 10-325 MG tablet Take 1 tablet by mouth every 6 (six) hours as needed for pain.    . OXYGEN Inhale 2 L into the lungs as needed.    . pantoprazole (PROTONIX) 40 MG tablet Take 40 mg by mouth daily.      Marland Kitchen spironolactone (ALDACTONE) 25 MG tablet Take 25 mg by mouth daily.  1  . Tamsulosin HCl (FLOMAX) 0.4 MG CAPS Take 0.4 mg by mouth daily.      . traZODone (DESYREL) 50 MG tablet Take 1 tablet by mouth at bedtime.    Marland Kitchen warfarin (COUMADIN) 5 MG tablet TAKE AS DIRECTED BY COUMADIN CLINIC 50 tablet 3  . albuterol-ipratropium (COMBIVENT) 18-103 MCG/ACT inhaler Inhale 2 puffs into the lungs every 4 (four) hours as needed. (Patient not taking: Reported on 10/04/2017) 1 Inhaler 0  . aspirin EC 81 MG EC tablet Take 1 tablet (81 mg total) by mouth daily. (Patient not taking: Reported on 10/04/2017)    . nitroGLYCERIN (NITROSTAT) 0.4 MG SL tablet Place 1 tablet (0.4 mg total) under the tongue every 5 (five) minutes x 3 doses as needed for chest pain. (Patient not taking: Reported on 10/04/2017) 25 tablet 1   No current facility-administered medications for this visit.     Allergies  Allergen Reactions  . Penicillins Hives and Shortness Of Breath    Has patient had a PCN reaction causing immediate rash, facial/tongue/throat swelling, SOB or lightheadedness with hypotension: Yes Has patient had a PCN reaction  causing severe rash involving mucus membranes or skin necrosis: No Has patient had a PCN reaction that required hospitalization: Yes Has patient had a PCN reaction occurring within the last 10 years: No If all of the above answers are "NO", then may proceed with Cephalosporin use.     Review of Systems:  Constitutional: Denies fever, chills, diaphoresis, appetite change and fatigue.  HEENT: Denies photophobia, eye pain, redness, hearing loss, ear pain, congestion, sore throat, rhinorrhea, sneezing, mouth sores, neck pain, neck stiffness and tinnitus.   Respiratory: Has SOB, No DOE, cough, chest tightness,  and wheezing.  On home oxygen at night only. Cardiovascular: Denies chest pain, palpitations and leg swelling.  Genitourinary: Denies dysuria, urgency, frequency, hematuria, flank pain and difficulty urinating.  Musculoskeletal: Denies myalgias, back pain, joint swelling, arthralgias and gait problem.  Skin: No rash.  Neurological: Denies dizziness, seizures, syncope, weakness, light-headedness, numbness and headaches.  Hematological: Denies adenopathy. Has Easy bruising -on Coumadin Psychiatric/Behavioral: Has anxiety or depression     Physical Exam:    BP 110/68   Pulse 69   Ht 5' 11.5" (1.816 m)   Wt 246 lb 4 oz (111.7 kg)   BMI 33.87 kg/m  Filed Weights   10/04/17 1011  Weight: 246 lb 4 oz (111.7 kg)   Constitutional:  Well-developed, in no acute distress. Psychiatric: Normal mood and affect. Behavior is normal. HEENT: Pupils normal.  Conjunctivae are normal. No scleral icterus. Neck supple.  Cardiovascular: Normal rate, regular rhythm. No edema Pulmonary/chest: Effort normal and breath sounds normal. No wheezing, rales or rhonchi. Abdominal: Soft, nondistended. Nontender. Bowel sounds active throughout. There are no masses palpable. No hepatomegaly. Rectal:  defered Neurological: Alert and oriented to person place and time. Skin: Skin is warm and dry. No rashes  noted.  Data Reviewed: I have personally reviewed following labs and imaging studies  CBC: CBC  Latest Ref Rng & Units 12/17/2016 12/20/2014 12/19/2014  WBC 4.0 - 10.5 K/uL 7.9 8.0 14.7(H)  Hemoglobin 13.0 - 17.0 g/dL 14.0 11.8(L) 14.3  Hematocrit 39.0 - 52.0 % 41.5 36.0(L) 43.0  Platelets 150 - 400 K/uL 99(L) 149(L) 171    CMP: CMP Latest Ref Rng & Units 12/17/2016 12/20/2014 12/19/2014  Glucose 65 - 99 mg/dL 169(H) 168(H) 210(H)  BUN 6 - 20 mg/dL 14 14 10   Creatinine 0.61 - 1.24 mg/dL 1.24 0.82 0.89  Sodium 135 - 145 mmol/L 134(L) 139 137  Potassium 3.5 - 5.1 mmol/L 3.9 3.9 3.8  Chloride 101 - 111 mmol/L 97(L) 103 103  CO2 22 - 32 mmol/L 28 25 25   Calcium 8.9 - 10.3 mg/dL 8.7(L) 8.2(L) 8.5(L)  Total Protein 6.5 - 8.1 g/dL - - -  Total Bilirubin 0.3 - 1.2 mg/dL - - -  Alkaline Phos 38 - 126 U/L - - -  AST 15 - 41 U/L - - -  ALT 17 - 63 U/L - - -  Discussed above in detail with the patient and patient's wife. CT scan was independently reviewed-the cyst wall is immature.  Abutting stomach. Over 45 pages of old records were reviewed-some summarized as above.   Carmell Austria, MD 10/04/2017, 10:40 AM  Cc: [name], MD

## 2017-10-11 ENCOUNTER — Other Ambulatory Visit: Payer: Self-pay | Admitting: Internal Medicine

## 2017-10-20 ENCOUNTER — Ambulatory Visit: Payer: Medicare Other

## 2017-10-20 DIAGNOSIS — Z5181 Encounter for therapeutic drug level monitoring: Secondary | ICD-10-CM | POA: Diagnosis not present

## 2017-10-20 DIAGNOSIS — I480 Paroxysmal atrial fibrillation: Secondary | ICD-10-CM | POA: Diagnosis not present

## 2017-10-20 DIAGNOSIS — Z7901 Long term (current) use of anticoagulants: Secondary | ICD-10-CM

## 2017-10-20 LAB — POCT INR: INR: 3.5 — AB (ref 2.0–3.0)

## 2017-10-20 NOTE — Patient Instructions (Signed)
Description   Skip today's dosage of Coumadin, then start taking 1/2 tablet daily except 1 tablet on Mondays, Wednesdays, and Fridays.  Recheck INR in 2 weeks.  Main [phone] Coumadin Clinic [phone].

## 2017-11-02 ENCOUNTER — Ambulatory Visit: Payer: Medicare Other | Admitting: *Deleted

## 2017-11-02 ENCOUNTER — Encounter (INDEPENDENT_AMBULATORY_CARE_PROVIDER_SITE_OTHER): Payer: Self-pay

## 2017-11-02 DIAGNOSIS — Z7901 Long term (current) use of anticoagulants: Secondary | ICD-10-CM | POA: Diagnosis not present

## 2017-11-02 DIAGNOSIS — I480 Paroxysmal atrial fibrillation: Secondary | ICD-10-CM

## 2017-11-02 DIAGNOSIS — Z5181 Encounter for therapeutic drug level monitoring: Secondary | ICD-10-CM | POA: Diagnosis not present

## 2017-11-02 LAB — POCT INR: INR: 2.1 (ref 2.0–3.0)

## 2017-11-02 NOTE — Patient Instructions (Signed)
Description   Continue taking 1/2 tablet daily except 1 tablet on Mondays, Wednesdays, and Fridays.  Recheck INR in 3 weeks.  Main [phone] Coumadin Clinic [phone].

## 2017-11-15 ENCOUNTER — Ambulatory Visit: Payer: Medicare Other

## 2017-11-15 DIAGNOSIS — Z5181 Encounter for therapeutic drug level monitoring: Secondary | ICD-10-CM | POA: Diagnosis not present

## 2017-11-15 DIAGNOSIS — Z7901 Long term (current) use of anticoagulants: Secondary | ICD-10-CM

## 2017-11-15 DIAGNOSIS — I480 Paroxysmal atrial fibrillation: Secondary | ICD-10-CM | POA: Diagnosis not present

## 2017-11-15 LAB — POCT INR: INR: 1.8 — AB (ref 2.0–3.0)

## 2017-11-15 NOTE — Patient Instructions (Signed)
Please take a whole tablet of coumadin tonight, then continue taking 1/2 tablet daily except 1 tablet on Mondays, Wednesdays, and Fridays.  Recheck INR in 3 weeks.  Main [phone] Coumadin Clinic [phone].

## 2017-12-06 ENCOUNTER — Ambulatory Visit: Payer: Medicare Other | Admitting: *Deleted

## 2017-12-06 ENCOUNTER — Telehealth: Payer: Self-pay | Admitting: Gastroenterology

## 2017-12-06 DIAGNOSIS — K863 Pseudocyst of pancreas: Secondary | ICD-10-CM

## 2017-12-06 DIAGNOSIS — Z7901 Long term (current) use of anticoagulants: Secondary | ICD-10-CM

## 2017-12-06 DIAGNOSIS — Z5181 Encounter for therapeutic drug level monitoring: Secondary | ICD-10-CM | POA: Diagnosis not present

## 2017-12-06 DIAGNOSIS — I480 Paroxysmal atrial fibrillation: Secondary | ICD-10-CM

## 2017-12-06 LAB — POCT INR: INR: 2.1 (ref 2.0–3.0)

## 2017-12-06 NOTE — Telephone Encounter (Signed)
Patient states at last appt with Dr.Gupta on 9.10.19 they discussed coming back in Dec for CT and appt. Patient wants to know if he can schedule CT and then follow up appt after.

## 2017-12-06 NOTE — Telephone Encounter (Signed)
Please advise, in your note you said a 12 week follow up and then a CT scan. What would you like to do?

## 2017-12-06 NOTE — Patient Instructions (Signed)
Description   Continue taking 1/2 tablet daily except 1 tablet on Mondays, Wednesdays, and Fridays.  Recheck INR in 4 weeks.  Main [phone] Coumadin Clinic [phone].

## 2017-12-08 ENCOUNTER — Institutional Professional Consult (permissible substitution): Payer: Medicare Other | Admitting: Pulmonary Disease

## 2017-12-08 NOTE — Telephone Encounter (Signed)
Good idea -lets do the CT scan and then follow-up

## 2017-12-09 NOTE — Telephone Encounter (Signed)
Called patient and left message, waiting for a re turned call.

## 2017-12-12 ENCOUNTER — Other Ambulatory Visit (INDEPENDENT_AMBULATORY_CARE_PROVIDER_SITE_OTHER): Payer: Medicare Other

## 2017-12-12 DIAGNOSIS — K863 Pseudocyst of pancreas: Secondary | ICD-10-CM | POA: Diagnosis not present

## 2017-12-12 LAB — COMPREHENSIVE METABOLIC PANEL
ALT: 19 U/L (ref 0–53)
AST: 17 U/L (ref 0–37)
Albumin: 4.2 g/dL (ref 3.5–5.2)
Alkaline Phosphatase: 37 U/L — ABNORMAL LOW (ref 39–117)
BILIRUBIN TOTAL: 0.5 mg/dL (ref 0.2–1.2)
BUN: 13 mg/dL (ref 6–23)
CO2: 28 meq/L (ref 19–32)
Calcium: 8.7 mg/dL (ref 8.4–10.5)
Chloride: 96 mEq/L (ref 96–112)
Creatinine, Ser: 1.41 mg/dL (ref 0.40–1.50)
GFR: 53.38 mL/min — AB (ref >60.00)
GLUCOSE: 398 mg/dL — AB (ref 70–99)
POTASSIUM: 3.8 meq/L (ref 3.5–5.1)
Sodium: 134 mEq/L — ABNORMAL LOW (ref 135–145)
Total Protein: 6.5 g/dL (ref 6.0–8.3)

## 2017-12-12 NOTE — Telephone Encounter (Signed)
Patient scheduled for CT Scan on 12/19/17 at 8am. Patient is going to come by office to pick up instructions and contrast.

## 2017-12-14 ENCOUNTER — Telehealth: Payer: Self-pay | Admitting: Gastroenterology

## 2017-12-14 DIAGNOSIS — K863 Pseudocyst of pancreas: Secondary | ICD-10-CM

## 2017-12-14 DIAGNOSIS — K859 Acute pancreatitis without necrosis or infection, unspecified: Secondary | ICD-10-CM | POA: Diagnosis not present

## 2017-12-14 DIAGNOSIS — E669 Obesity, unspecified: Secondary | ICD-10-CM

## 2017-12-14 DIAGNOSIS — N183 Chronic kidney disease, stage 3 (moderate): Secondary | ICD-10-CM

## 2017-12-14 DIAGNOSIS — Z7901 Long term (current) use of anticoagulants: Secondary | ICD-10-CM | POA: Diagnosis not present

## 2017-12-14 DIAGNOSIS — I5032 Chronic diastolic (congestive) heart failure: Secondary | ICD-10-CM

## 2017-12-14 DIAGNOSIS — Z86711 Personal history of pulmonary embolism: Secondary | ICD-10-CM

## 2017-12-14 DIAGNOSIS — E871 Hypo-osmolality and hyponatremia: Secondary | ICD-10-CM | POA: Diagnosis not present

## 2017-12-14 DIAGNOSIS — K219 Gastro-esophageal reflux disease without esophagitis: Secondary | ICD-10-CM

## 2017-12-14 DIAGNOSIS — I2510 Atherosclerotic heart disease of native coronary artery without angina pectoris: Secondary | ICD-10-CM

## 2017-12-14 NOTE — Telephone Encounter (Signed)
Called by the patient's daughter this evening April Wilburn ([phone]). Described that patient had eaten some fatty food late last week. Patient developed new onset abdominal pain that progressed from earlier this morning until now he eventually told his daughter around 17 PM that things were worse and progressing.  He is currently at the [facility] emergency department. Patient's daughter describes elevations in pancreatic enzymes. She describes concern from fixation of pancreatitis and possible diverticulitis. She asked whether the patient should be transferred to [facility]. I discussed with her that the most important thing at this acute moment is for the patient to be evaluated and hydrated to ensure he does not develop dehydration or already have evidence of systemic inflammation or organ dysfunction. If the patient who is currently about to get a CT scan per the patient's daughter's report has any evidence of a more complicated pancreatitis picture than it would be reasonable for them to reach out to the hospitalist who at [facility] or [facility] to evaluate for the possibility of transfer. Looking through the notes, this patient has had acute recurrent pancreatitis without clear etiology other than elevations in hypertriglycerides which have not been checked at periods and time when he is doing well. He has had a cyst in the tail that was being followed as well.  This was plan for follow-up in the coming days or weeks to ensure that it had decreased in size. I told the patient's daughter to feel comfortable with reaching back out if there are issues but in the decision about transfer to [facility] or to 1 of the other tertiary hospitals will go through the hospitalist. Happy to discuss case with our hospitalist group if necessary. I will relay this information to Dr. Lyndel Safe as well.  Justice Britain, MD [facility] Office # [phone]

## 2017-12-15 ENCOUNTER — Institutional Professional Consult (permissible substitution): Payer: Medicare Other | Admitting: Pulmonary Disease

## 2017-12-15 DIAGNOSIS — Z7901 Long term (current) use of anticoagulants: Secondary | ICD-10-CM | POA: Diagnosis not present

## 2017-12-15 DIAGNOSIS — E871 Hypo-osmolality and hyponatremia: Secondary | ICD-10-CM | POA: Diagnosis not present

## 2017-12-15 DIAGNOSIS — K859 Acute pancreatitis without necrosis or infection, unspecified: Secondary | ICD-10-CM | POA: Diagnosis not present

## 2017-12-15 DIAGNOSIS — K863 Pseudocyst of pancreas: Secondary | ICD-10-CM | POA: Diagnosis not present

## 2017-12-16 ENCOUNTER — Telehealth: Payer: Self-pay | Admitting: Gastroenterology

## 2017-12-16 DIAGNOSIS — K859 Acute pancreatitis without necrosis or infection, unspecified: Secondary | ICD-10-CM | POA: Diagnosis not present

## 2017-12-16 DIAGNOSIS — E871 Hypo-osmolality and hyponatremia: Secondary | ICD-10-CM | POA: Diagnosis not present

## 2017-12-16 DIAGNOSIS — Z7901 Long term (current) use of anticoagulants: Secondary | ICD-10-CM | POA: Diagnosis not present

## 2017-12-16 DIAGNOSIS — K863 Pseudocyst of pancreas: Secondary | ICD-10-CM | POA: Diagnosis not present

## 2017-12-16 NOTE — Telephone Encounter (Signed)
FYI-Called and spoke with [facility] CT cancelled via phone and Epic; [place] made a notation on their schedule that the patient has had a CT scan at [facility] and does not need to repeat this scan at this time;

## 2017-12-16 NOTE — Telephone Encounter (Signed)
Called and spoke with patient- pt reports he did have a CT scan at [facility] and will try to get the records sent to [place] on Monday; patient informed at this time that the CT scan scheduled for Monday will be cancelled and may be rescheduled once Dr. Lyndel Safe has had time to review the records from the CT scan that was completed on 12/14/17; patient was informed he will receive a call from the office once Dr. Lyndel Safe has put a plan of care together; patient verbalized understanding of information and instructions; patient advised to call back if questions/concerns arise;

## 2017-12-16 NOTE — Telephone Encounter (Signed)
Patients wife states pt already had CT done yesterday at [facility] and is now in the [place]. Patient states he will try to get CT results sent to Dr.Gupta and wants to make sure he does not need to come for the one scheduled on Monday 11.25.19, and if not that one needs to be canceled.

## 2017-12-19 ENCOUNTER — Ambulatory Visit (HOSPITAL_BASED_OUTPATIENT_CLINIC_OR_DEPARTMENT_OTHER): Payer: Medicare Other

## 2017-12-19 NOTE — Telephone Encounter (Signed)
Steve Anthony, Can we please get a copy of these records? The patient stated he would try to get a copy for Dr. Lyndel Safe but I dont know if has had a chance to go to medical records at [facility] to obtain a copy;

## 2017-12-19 NOTE — Telephone Encounter (Signed)
Thanks for letting me know Can we please get the copy of the CT

## 2017-12-20 NOTE — Telephone Encounter (Signed)
I do not have a signed medical records release form so [facility]sey will not send me his results. I have called the patient to ask him to go to [place] to sign one to have them sent. He does not have a voicemail, I will attempt to call him back later.

## 2017-12-20 NOTE — Telephone Encounter (Signed)
Records were sent and are on Dr. Leland Her desk.

## 2018-01-03 ENCOUNTER — Ambulatory Visit: Payer: Medicare Other | Admitting: *Deleted

## 2018-01-03 DIAGNOSIS — I480 Paroxysmal atrial fibrillation: Secondary | ICD-10-CM

## 2018-01-03 DIAGNOSIS — Z5181 Encounter for therapeutic drug level monitoring: Secondary | ICD-10-CM

## 2018-01-03 DIAGNOSIS — Z7901 Long term (current) use of anticoagulants: Secondary | ICD-10-CM

## 2018-01-03 LAB — POCT INR: INR: 1.8 — AB (ref 2.0–3.0)

## 2018-01-03 NOTE — Patient Instructions (Addendum)
Description   Spoke with pt after in office appt & instructed since you are going to start the Prednisone taper dose starting today continue taking 1/2 tablet daily except 1 tablet on Mondays, Wednesdays, and Fridays.  Recheck INR in on Monday per pt availability.  Main [phone] Coumadin Clinic [phone].

## 2018-01-09 ENCOUNTER — Encounter: Payer: Self-pay | Admitting: Internal Medicine

## 2018-01-09 ENCOUNTER — Encounter (INDEPENDENT_AMBULATORY_CARE_PROVIDER_SITE_OTHER): Payer: Self-pay

## 2018-01-09 ENCOUNTER — Ambulatory Visit (INDEPENDENT_AMBULATORY_CARE_PROVIDER_SITE_OTHER): Payer: Medicare Other | Admitting: Internal Medicine

## 2018-01-09 ENCOUNTER — Ambulatory Visit (INDEPENDENT_AMBULATORY_CARE_PROVIDER_SITE_OTHER): Payer: Medicare Other | Admitting: Pharmacist

## 2018-01-09 VITALS — BP 112/74 | HR 58 | Ht 71.5 in | Wt 246.0 lb

## 2018-01-09 DIAGNOSIS — I2510 Atherosclerotic heart disease of native coronary artery without angina pectoris: Secondary | ICD-10-CM | POA: Diagnosis not present

## 2018-01-09 DIAGNOSIS — Z7901 Long term (current) use of anticoagulants: Secondary | ICD-10-CM | POA: Diagnosis not present

## 2018-01-09 DIAGNOSIS — I480 Paroxysmal atrial fibrillation: Secondary | ICD-10-CM | POA: Diagnosis not present

## 2018-01-09 DIAGNOSIS — I10 Essential (primary) hypertension: Secondary | ICD-10-CM

## 2018-01-09 DIAGNOSIS — Z5181 Encounter for therapeutic drug level monitoring: Secondary | ICD-10-CM

## 2018-01-09 LAB — POCT INR: INR: 1.3 — AB (ref 2.0–3.0)

## 2018-01-09 MED ORDER — APIXABAN 5 MG PO TABS: 5.0000 mg | ORAL_TABLET | Freq: Two times a day (BID) | ORAL | 3 refills | Status: DC

## 2018-01-09 NOTE — Progress Notes (Signed)
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  Since last being seen in our clinic, the patient reports doing very well.  Today, he denies symptoms of palpitations, chest pain, shortness of breath,  lower extremity edema, dizziness, presyncope, or syncope.  The patient is otherwise without complaint today.   Past Medical History:  Diagnosis Date  . Arthritis   . CAD (coronary artery disease)    a. S/P PCI mid LAD (vision stent) 08/31/2004;  b. repeat cath 6/11 - no progression of CAD. c. Cath 10/2012 given moderate CAD on cardiac CT - no change from prior cath.  . Chest pain 12/16/2016  . COPD (chronic obstructive pulmonary disease) ([place])   . DJD (degenerative joint disease) of lumbar spine   . Dyslipidemia   . Gallstones   . GERD (gastroesophageal reflux disease)   . Hypertension   . Hyperthyroidism   . Melanoma ([place])    melanoma removed from neck   . Nephrolithiasis   . On home oxygen therapy    "2L q hs" (11/08/2012)  . OSA (obstructive sleep apnea)    a. cpap noncompliance- patient reports on 12/10/14- does not use CPAP  . Palpitations    a. 10/2012 - s/p LINQ loop recorder to assess for arrhythmia.   . Paroxysmal A-fib ([place])    a. S/P PVI 2/11 and 9/11  . SVT (supraventricular tachycardia) ([place])    a. Atypical AVNRT of the slow pathway  . Type II diabetes mellitus ([place])    Past Surgical History:  Procedure Laterality Date  . ATRIAL ABLATION SURGERY  2007   S/P slow pathway ablation for atypical AVNRT   . CHOLECYSTECTOMY  ~ 2008  . COLON RESECTION  02/2013   . COLONOSCOPY  12/25/2007   Colonic polyp status post polypectomy. Internal hemorrhoids.   . CORONARY ANGIOPLASTY WITH STENT PLACEMENT  08/30/2004   Vision; to LAD   . CYSTOSCOPY W/ STONE MANIPULATION  ~ 1998   "once" (11/08/2012)  . EP Study  2008   Negative EP study in [place]  . FRACTURE SURGERY    . LEFT HEART  CATHETERIZATION WITH CORONARY ANGIOGRAM N/A 11/10/2012   Procedure: LEFT HEART CATHETERIZATION WITH CORONARY ANGIOGRAM;  Surgeon: Burnell Blanks, MD;  Location: [facility] CATH LAB;  Service: Cardiovascular;  Laterality: N/A;  . LOOP RECORDER IMPLANT N/A 11/10/2012   Medtronic LinQ implanted by Dr Rayann Heman for afib management  . [place] SURGERY  2000; 2002  . POSTERIOR LUMBAR FUSION  2004  . PROSTATE SURGERY  ~ 2009  . PVI/ CTI ablation  02/25/2009 and 09/2009   S/P afib/ CTI ablation  . TIBIA FRACTURE SURGERY Right 1990   "broke in 3 places; had rod put in" (11/08/2012)  . TIBIA HARDWARE REMOVAL Right ~ 1991  . TONSILLECTOMY  1965  . TOTAL HIP ARTHROPLASTY Right 12/18/2014   Procedure: RIGHT TOTAL HIP ARTHROPLASTY ANTERIOR APPROACH;  Surgeon: Gaynelle Arabian, MD;  Location: [facility];  Service: Orthopedics;  Laterality: Right;    ROS- all systems are reviewed and negatives except as per HPI above  Current Outpatient Medications  Medication Sig Dispense Refill  . atorvastatin (LIPITOR) 40 MG tablet Take 40 mg by mouth at bedtime.     . Cyanocobalamin (VITAMIN B-12 IJ) Inject 1,000 mcg as directed every 30 (thirty) days.    Marland Kitchen docusate sodium (COLACE) 100 MG capsule Take 100 mg  by mouth daily.    Marland Kitchen escitalopram (LEXAPRO) 10 MG tablet Take 10 mg by mouth daily.  1  . fenofibrate 160 MG tablet Take 160 mg by mouth daily.      . ferrous sulfate 325 (65 FE) MG tablet Take 325 mg by mouth daily with breakfast.    . furosemide (LASIX) 20 MG tablet Take 40 mg by mouth daily.     Marland Kitchen gabapentin (NEURONTIN) 100 MG capsule Take 100 mg by mouth 3 (three) times daily.    . Melatonin 10 MG TABS Take 1 tablet by mouth at bedtime.    . metoprolol succinate (TOPROL-XL) 50 MG 24 hr tablet TAKE 1 TABLET BY MOUTH TWICE A DAY 60 tablet 3  . nitroGLYCERIN (NITROSTAT) 0.4 MG SL tablet Place 1 tablet (0.4 mg total) under the tongue every 5 (five) minutes x 3 doses as needed for chest pain. 25 tablet 1  .  oxyCODONE-acetaminophen (PERCOCET) 10-325 MG tablet Take 1 tablet by mouth every 6 (six) hours as needed for pain.    . OXYGEN Inhale 2 L into the lungs as needed.    . pantoprazole (PROTONIX) 40 MG tablet Take 40 mg by mouth daily.      Marland Kitchen spironolactone (ALDACTONE) 25 MG tablet Take 25 mg by mouth daily.  1  . Tamsulosin HCl (FLOMAX) 0.4 MG CAPS Take 0.4 mg by mouth daily.      . traZODone (DESYREL) 50 MG tablet Take 1 tablet by mouth at bedtime.    Marland Kitchen warfarin (COUMADIN) 5 MG tablet TAKE AS DIRECTED BY COUMADIN CLINIC 50 tablet 3   No current facility-administered medications for this visit.     Physical Exam: Vitals:   01/09/18 0845  BP: 112/74  Pulse: (!) 58  SpO2: 99%  Weight: 246 lb (111.6 kg)  Height: 5' 11.5" (1.816 m)    GEN- The patient is well appearing, alert and oriented x 3 today.   Head- normocephalic, atraumatic Eyes-  Sclera clear, conjunctiva pink Ears- hearing intact Oropharynx- clear Lungs- Clear to ausculation bilaterally, normal work of breathing Heart- Regular rate and rhythm, no murmurs, rubs or gallops, PMI not laterally displaced GI- soft, NT, ND, + BS Extremities- no clubbing, cyanosis, or edema  Wt Readings from Last 3 Encounters:  01/09/18 246 lb (111.6 kg)  10/04/17 246 lb 4 oz (111.7 kg)  08/25/17 251 lb 4 oz (114 kg)    EKG tracing ordered today is personally reviewed and shows sinus rhythm 58 bpm, PR 136 msec, QRS 90 msec, QTc 426 msec,   Assessment and Plan:  1. afib  Well controlled On coumadin,  chads2vasc score is 3 We discussed ILR removal today.  Risks and benefits of the procedure were discussed at length.  His device is no longer functioning due to battery.  He wishes to proceed at this time.  2. HTN Stable No change required today  3. CAD No ischemic symptoms S/p prior PCI of mid LAD Continue medical therapy  Return every year to see EP NP  Thompson Grayer MD, [facility] 01/09/2018 9:25 AM     PROCEDURES:   1. Implantable  loop recorder explantation       DESCRIPTION OF PROCEDURE:  Informed written consent was obtained.  The patient required no sedation for the procedure today.   The patients left chest was therefore prepped and draped in the usual sterile fashion.  The skin overlying the ILR monitor was infiltrated with lidocaine for local analgesia.  A 0.5-cm incision  was made over the site.  The previously implanted ILR was exposed and removed using a combination of sharp and blunt dissection.  Steri- Strips and a sterile dressing were then applied. EBL<47m.  There were no early apparent complications.     CONCLUSIONS:   1. Successful explantation of a Medtronic Reveal LINQ implantable loop recorder   2. No early apparent complications.        JThompson GrayerMD, FNatural Eyes Laser And Surgery Center LlLP12/16/2019 9:54 AM

## 2018-01-09 NOTE — Patient Instructions (Addendum)
Medication Instructions:  Your physician has recommended you make the following change in your medication:   1.  STOP taking warfarin  2.  Start taking Eliquis 5 mg --Take one tablet by mouth twice a day.  START ELIQUIS as advised by coumadin clinic  Labwork: None ordered.  Testing/Procedures: None ordered.  Follow-Up: Your physician wants you to follow-up in: one year with Chanetta Marshall, NP.   You will receive a reminder letter in the mail two months in advance. If you don't receive a letter, please call our office to schedule the follow-up appointment.  Any Other Special Instructions Will Be Listed Below (If Applicable).  If you need a refill on your cardiac medications before your next appointment, please call your pharmacy.    Implantable Loop Recorder Placement, Care After Refer to this sheet in the next few weeks. These instructions provide you with information about caring for yourself after your procedure. Your health care provider may also give you more specific instructions. Your treatment has been planned according to current medical practices, but problems sometimes occur. Call your health care provider if you have any problems or questions after your procedure. What can I expect after the procedure? After the procedure, it is common to have:  Soreness or pain near the cut from surgery (incision).  Some swelling or bruising near the incision.  Follow these instructions at home:  Medicines  Take over-the-counter and prescription medicines only as told by your health care provider.  If you were prescribed an antibiotic medicine, take it as told by your health care provider. Do not stop taking the antibiotic even if you start to feel better.  Bathing Do not take baths, swim, or use a hot tub until your health care provider approves. You may shower 24 hours after removal of your monitor.  Incision care  Follow instructions from your health care provider about how  to take care of your incision. Make sure you: ? Remove your top dressing after 24 hours (before you shower) ? Leave stitches (sutures), skin glue, or adhesive strips in place. These skin closures may need to stay in place for 2 weeks or longer. If adhesive strip edges start to loosen and curl up, you may trim the loose edges. Do not remove adhesive strips completely unless your health care provider tells you to do that.  Check your incision area every day for signs of infection. Check for: ? More redness, swelling, or pain. ? Fluid or blood. ? Warmth. ? Pus or a bad smell.  Contact a health care provider if:  You have more redness, swelling, or pain around your incision.  You have more fluid or blood coming from your incision.  Your incision feels warm to the touch.  You have pus or a bad smell coming from your incision.  You have a fever.  You have pain that is not relieved by your pain medicine.  You have triggered your device because of fainting (syncope) or because of a heartbeat that feels like it is racing, slow, fluttering, or skipping (palpitations).     Apixaban oral tablets What is this medicine? APIXABAN (a PIX a ban) is an anticoagulant (blood thinner). It is used to lower the chance of stroke in people with a medical condition called atrial fibrillation. It is also used to treat or prevent blood clots in the lungs or in the veins. This medicine may be used for other purposes; ask your health care provider or pharmacist if you have  questions. COMMON BRAND NAME(S): Eliquis What should I tell my health care provider before I take this medicine? They need to know if you have any of these conditions: -bleeding disorders -bleeding in the brain -blood in your stools (black or tarry stools) or if you have blood in your vomit -history of stomach bleeding -kidney disease -liver disease -mechanical heart valve -an unusual or allergic reaction to apixaban, other medicines,  foods, dyes, or preservatives -pregnant or trying to get pregnant -breast-feeding How should I use this medicine? Take this medicine by mouth with a glass of water. Follow the directions on the prescription label. You can take it with or without food. If it upsets your stomach, take it with food. Take your medicine at regular intervals. Do not take it more often than directed. Do not stop taking except on your doctor's advice. Stopping this medicine may increase your risk of a blot clot. Be sure to refill your prescription before you run out of medicine. Talk to your pediatrician regarding the use of this medicine in children. Special care may be needed. Overdosage: If you think you have taken too much of this medicine contact a poison control center or emergency room at once. NOTE: This medicine is only for you. Do not share this medicine with others. What if I miss a dose? If you miss a dose, take it as soon as you can. If it is almost time for your next dose, take only that dose. Do not take double or extra doses. What may interact with this medicine? This medicine may interact with the following: -aspirin and aspirin-like medicines -certain medicines for fungal infections like ketoconazole and itraconazole -certain medicines for seizures like carbamazepine and phenytoin -certain medicines that treat or prevent blood clots like warfarin, enoxaparin, and dalteparin -clarithromycin -NSAIDs, medicines for pain and inflammation, like ibuprofen or naproxen -rifampin -ritonavir -St. John's wort This list may not describe all possible interactions. Give your health care provider a list of all the medicines, herbs, non-prescription drugs, or dietary supplements you use. Also tell them if you smoke, drink alcohol, or use illegal drugs. Some items may interact with your medicine. What should I watch for while using this medicine? Visit your doctor or health care professional for regular checks on  your progress. Notify your doctor or health care professional and seek emergency treatment if you develop breathing problems; changes in vision; chest pain; severe, sudden headache; pain, swelling, warmth in the leg; trouble speaking; sudden numbness or weakness of the face, arm or leg. These can be signs that your condition has gotten worse. If you are going to have surgery or other procedure, tell your doctor that you are taking this medicine. What side effects may I notice from receiving this medicine? Side effects that you should report to your doctor or health care professional as soon as possible: -allergic reactions like skin rash, itching or hives, swelling of the face, lips, or tongue -signs and symptoms of bleeding such as bloody or black, tarry stools; red or dark-brown urine; spitting up blood or brown material that looks like coffee grounds; red spots on the skin; unusual bruising or bleeding from the eye, gums, or nose This list may not describe all possible side effects. Call your doctor for medical advice about side effects. You may report side effects to FDA at 1-800-FDA-1088. Where should I keep my medicine? Keep out of the reach of children. Store at room temperature between 20 and 25 degrees C (68 and 77  degrees F). Throw away any unused medicine after the expiration date. NOTE: This sheet is a summary. It may not cover all possible information. If you have questions about this medicine, talk to your doctor, pharmacist, or health care provider.  2018 Elsevier/Gold Standard (2015-08-04 11:54:23)

## 2018-01-09 NOTE — Patient Instructions (Signed)
Stop taking warfarin. Start taking Eliquis 80m twice a day about 12 hours apart. You can take your first dose this morning.

## 2018-01-31 ENCOUNTER — Telehealth: Payer: Self-pay | Admitting: Pharmacist

## 2018-01-31 NOTE — Telephone Encounter (Signed)
New Message:    Pt called, said he needs to talk to you. I was unable to transfer call, having problems transferring all calls.

## 2018-01-31 NOTE — Telephone Encounter (Signed)
Spoke with pt wife who reports that he was started on a Zpak and prednisone taper. She wondered if he needed to have his blood checked. Advised that he does not need to have blood checked since he is on Eliquis now. Advised to continue Eliquis as prescribed. She states understanding.

## 2018-02-04 ENCOUNTER — Other Ambulatory Visit: Payer: Self-pay | Admitting: Internal Medicine

## 2018-02-15 DIAGNOSIS — I5032 Chronic diastolic (congestive) heart failure: Secondary | ICD-10-CM

## 2018-02-15 DIAGNOSIS — E119 Type 2 diabetes mellitus without complications: Secondary | ICD-10-CM

## 2018-02-15 DIAGNOSIS — K219 Gastro-esophageal reflux disease without esophagitis: Secondary | ICD-10-CM

## 2018-02-15 DIAGNOSIS — N183 Chronic kidney disease, stage 3 (moderate): Secondary | ICD-10-CM

## 2018-02-15 DIAGNOSIS — I10 Essential (primary) hypertension: Secondary | ICD-10-CM | POA: Diagnosis not present

## 2018-02-15 DIAGNOSIS — J449 Chronic obstructive pulmonary disease, unspecified: Secondary | ICD-10-CM

## 2018-02-15 DIAGNOSIS — K859 Acute pancreatitis without necrosis or infection, unspecified: Secondary | ICD-10-CM | POA: Diagnosis not present

## 2018-02-15 DIAGNOSIS — K863 Pseudocyst of pancreas: Secondary | ICD-10-CM

## 2018-02-15 DIAGNOSIS — I4891 Unspecified atrial fibrillation: Secondary | ICD-10-CM | POA: Diagnosis not present

## 2018-02-16 DIAGNOSIS — K863 Pseudocyst of pancreas: Secondary | ICD-10-CM | POA: Diagnosis not present

## 2018-02-16 DIAGNOSIS — I4891 Unspecified atrial fibrillation: Secondary | ICD-10-CM | POA: Diagnosis not present

## 2018-02-16 DIAGNOSIS — N183 Chronic kidney disease, stage 3 (moderate): Secondary | ICD-10-CM | POA: Diagnosis not present

## 2018-02-16 DIAGNOSIS — K859 Acute pancreatitis without necrosis or infection, unspecified: Secondary | ICD-10-CM | POA: Diagnosis not present

## 2018-02-17 DIAGNOSIS — K863 Pseudocyst of pancreas: Secondary | ICD-10-CM | POA: Diagnosis not present

## 2018-02-17 DIAGNOSIS — N183 Chronic kidney disease, stage 3 (moderate): Secondary | ICD-10-CM | POA: Diagnosis not present

## 2018-02-17 DIAGNOSIS — I4891 Unspecified atrial fibrillation: Secondary | ICD-10-CM | POA: Diagnosis not present

## 2018-02-17 DIAGNOSIS — K859 Acute pancreatitis without necrosis or infection, unspecified: Secondary | ICD-10-CM | POA: Diagnosis not present

## 2018-03-06 ENCOUNTER — Telehealth: Payer: Self-pay | Admitting: Gastroenterology

## 2018-03-06 NOTE — Telephone Encounter (Signed)
Pt is needing sooner appt than 03/16/2018

## 2018-03-06 NOTE — Telephone Encounter (Signed)
Left message for patient to call back  

## 2018-03-07 NOTE — Telephone Encounter (Signed)
Left message for patient to call back  

## 2018-03-07 NOTE — Telephone Encounter (Signed)
Pt returned call

## 2018-03-07 NOTE — Telephone Encounter (Signed)
Called and spoke with patient-informed patient of available appts with [facility]'- patient declined sooner appt with APP; patient reports he will "just keep the appt with Dr. Lyndel Safe since it is not that far away"; Patient was advised to call back if questions/concerns arise;Patient verbalized understanding of information/instructions;

## 2018-03-16 ENCOUNTER — Other Ambulatory Visit (INDEPENDENT_AMBULATORY_CARE_PROVIDER_SITE_OTHER): Payer: Medicare Other

## 2018-03-16 ENCOUNTER — Ambulatory Visit: Payer: Medicare Other | Admitting: Gastroenterology

## 2018-03-16 ENCOUNTER — Encounter: Payer: Self-pay | Admitting: Gastroenterology

## 2018-03-16 VITALS — BP 90/64 | HR 71 | Ht 72.0 in | Wt 234.4 lb

## 2018-03-16 DIAGNOSIS — Z8719 Personal history of other diseases of the digestive system: Secondary | ICD-10-CM

## 2018-03-16 DIAGNOSIS — K859 Acute pancreatitis without necrosis or infection, unspecified: Secondary | ICD-10-CM

## 2018-03-16 DIAGNOSIS — E781 Pure hyperglyceridemia: Secondary | ICD-10-CM

## 2018-03-16 DIAGNOSIS — K863 Pseudocyst of pancreas: Secondary | ICD-10-CM

## 2018-03-16 LAB — CBC WITH DIFFERENTIAL/PLATELET
Basophils Absolute: 0 10*3/uL (ref 0.0–0.1)
Basophils Relative: 0.5 % (ref 0.0–3.0)
EOS ABS: 0.3 10*3/uL (ref 0.0–0.7)
Eosinophils Relative: 5.7 % — ABNORMAL HIGH (ref 0.0–5.0)
HEMATOCRIT: 42.1 % (ref 39.0–52.0)
Hemoglobin: 13.7 g/dL (ref 13.0–17.0)
LYMPHS PCT: 20.4 % (ref 12.0–46.0)
Lymphs Abs: 1.2 10*3/uL (ref 0.7–4.0)
MCHC: 32.6 g/dL (ref 30.0–36.0)
MCV: 86.8 fl (ref 78.0–100.0)
Monocytes Absolute: 0.3 10*3/uL (ref 0.1–1.0)
Monocytes Relative: 5.3 % (ref 3.0–12.0)
Neutro Abs: 4 10*3/uL (ref 1.4–7.7)
Neutrophils Relative %: 68.1 % (ref 43.0–77.0)
Platelets: 140 10*3/uL — ABNORMAL LOW (ref 150.0–400.0)
RBC: 4.85 Mil/uL (ref 4.22–5.81)
RDW: 14.2 % (ref 11.5–15.5)
WBC: 5.9 10*3/uL (ref 4.0–10.5)

## 2018-03-16 LAB — COMPREHENSIVE METABOLIC PANEL
ALT: 11 U/L (ref 0–53)
AST: 16 U/L (ref 0–37)
Albumin: 4.2 g/dL (ref 3.5–5.2)
Alkaline Phosphatase: 37 U/L — ABNORMAL LOW (ref 39–117)
BUN: 19 mg/dL (ref 6–23)
CO2: 30 mEq/L (ref 19–32)
Calcium: 7.9 mg/dL — ABNORMAL LOW (ref 8.4–10.5)
Chloride: 101 mEq/L (ref 96–112)
Creatinine, Ser: 1.59 mg/dL — ABNORMAL HIGH (ref 0.40–1.50)
GFR: 43.69 mL/min — ABNORMAL LOW (ref >60.00)
Glucose, Bld: 91 mg/dL (ref 70–99)
Potassium: 4.5 mEq/L (ref 3.5–5.1)
Sodium: 138 mEq/L (ref 135–145)
Total Bilirubin: 0.6 mg/dL (ref 0.2–1.2)
Total Protein: 6.6 g/dL (ref 6.0–8.3)

## 2018-03-16 LAB — TRIGLYCERIDES: Triglycerides: 246 mg/dL — ABNORMAL HIGH (ref 0.0–149.0)

## 2018-03-16 LAB — LIPASE: LIPASE: 47 U/L (ref 11.0–59.0)

## 2018-03-16 NOTE — Patient Instructions (Addendum)
If you are age 67 or older, your body mass index should be between 23-30. Your Body mass index is 31.79 kg/m. If this is out of the aforementioned range listed, please consider follow up with your Primary Care Provider.  If you are age 49 or younger, your body mass index should be between 19-25. Your Body mass index is 31.79 kg/m. If this is out of the aformentioned range listed, please consider follow up with your Primary Care Provider.  You have been scheduled for an MRI at [facility] on 03/22/18. Your appointment time is 10:00am. Please arrive 30 minutes prior to your appointment time for registration purposes. Please make certain not to have anything to eat or drink 6 hours prior to your test. In addition, if you have any metal in your body, have a pacemaker or defibrillator, please be sure to let your ordering physician know. This test typically takes 45 minutes to 1 hour to complete. Should you need to reschedule, please call [phone] to do so.  Please go to the lab on the 2nd floor [address] before you leave the office today.     Thank you,  Dr. Jackquline Denmark

## 2018-03-16 NOTE — Progress Notes (Signed)
Chief Complaint: FU pancreatic pseudocyst  Referring Provider:  [name], MD      ASSESSMENT AND PLAN;   #1. H/O acute recurrent pancreatitis with symptomatic pseudocysts. (?etiology) (adm RH 02/15/2018, June, July, dec 2019, 08/2010) with  pancreatic tail pseudocyst 7 x 9 cm (on CT urogram 09/22/2017). H/O remote ETOH abuse, none since 1989. S/P chole in past. H/O hypertriglyceridemia. Nl LFTs 08/2017, most recent TG 278 (09/30/2017), Nl lipase.  No clinical evidence of exocrine insufficiency or pancreatic calcification on noncontrast CT  #2. Multiple comorbid conditions including A. fib on Eliquis, COPD on home oxygen, sleep apnea, stage III CKD (Cr 1.59 09/2017), CAD, dCHF, DM2, obesity, history of remote PE.  #3. Moderate splenomegaly (on CT) with mild thrombocytopenia with liver cirrhosis on CT 03/01/2018 with portal venous hypertension, portal venous collaterals.  #4. Adm d/t Klebsiella urosepsis d/t nephrolithiasis 08/2017, Rx with cephalexin.  Plan: -CBC, CMP, amylase/lipase, PT, ammonia, AFP and fasting TG today. -MRI pancreas with MRCP. -Refer to Dr Rush Landmark (after MRCP) for possible EUS/pseudocyst drainage if needed. -Continue oxycodone 27m po Q6 hrs (uses for back pain). -He will continue taking fenofibrate. -Continue Protonix for now. -Recommend best possible control for diabetes.   HPI:    Steve ZWAHLENis a 68y.o. male  [facility] recurrent pancreatitis. Continues to have intermittent left upper quadrant abdominal pain Has been compliant with low-fat diet. Has history suggestive of early satiety. No associated nausea/vomiting/jaundice/fever or chills. He denies having any diarrhea.  Has chronic constipation ever since he has been on narcotics for back pain.  Better with stool softeners. Most recent CT as below 03/01/2018 showed another smaller pseudocyst.     Previous imaging studies : -  NCCT scan 09/22/2017 -showed large 7 into 9 cm pancreatic pseudocyst in the tail of the pancreas. -CT Abdo/pelvis with IV contrast 02/15/2018: Acute pancreatitis, 8.3 cm pseudocyst of the pancreatic tail.  Unchanged splenomegaly. - CT Abdo/pelvis with IV contrast 03/01/2018: Resolution of acute pancreatitis.  Pancreas with relatively normal enhancement.  Now with 2 pseudocysts.  The largest measuring 8.5 x 4.0 cm.  No pancreatic ductal dilatation.  Now has cirrhotic changes involving liver with portal venous collaterals and splenomegaly.   SH- daughter is a RTherapist, sports d[place]udloggerof [place] (April phone #[phone] Past Medical History:  Diagnosis Date  . Arthritis   . CAD (coronary artery disease)    a. S/P PCI mid LAD (vision stent) 08/31/2004;  b. repeat cath 6/11 - no progression of CAD. c. Cath 10/2012 given moderate CAD on cardiac CT - no change from prior cath.  . Chest pain 12/16/2016  . COPD (chronic obstructive pulmonary disease) ([place]   . DJD (degenerative joint disease) of lumbar spine   . Dyslipidemia   . Gallstones   . GERD (gastroesophageal reflux disease)   . Hypertension   . Hyperthyroidism   . [place]    melanoma removed from neck   . Nephrolithiasis   . On home oxygen therapy    "2L q hs" (11/08/2012)  . OSA (obstructive sleep apnea)    a. cpap noncompliance- patient reports on 12/10/14- does not use CPAP  . Palpitations    a. 10/2012 - s/p LINQ loop recorder to assess for arrhythmia.   . Paroxysmal A-fib ([place]    a. S/P PVI 2/11 and 9/11  . SVT (supra[place]ricular tachycardia) ([place]    a. Atypical AVNRT of  the slow pathway  . Type II diabetes mellitus ([place])     Past Surgical History:  Procedure Laterality Date  . ATRIAL ABLATION SURGERY  2007   S/P slow pathway ablation for atypical AVNRT   . CHOLECYSTECTOMY  ~ 2008  . COLON RESECTION  02/2013   . COLONOSCOPY  12/25/2007   Colonic polyp status post polypectomy. Internal hemorrhoids.   . CORONARY  ANGIOPLASTY WITH STENT PLACEMENT  08/30/2004   Vision; to LAD   . CYSTOSCOPY W/ STONE MANIPULATION  ~ 1998   "once" (11/08/2012)  . EP Study  2008   Negative EP study in [place]  . FRACTURE SURGERY    . LEFT HEART CATHETERIZATION WITH CORONARY ANGIOGRAM N/A 11/10/2012   Procedure: LEFT HEART CATHETERIZATION WITH CORONARY ANGIOGRAM;  Surgeon: Burnell Blanks, MD;  Location[facility] CATH LAB;  Service: Cardiovascular;  Laterality: N/A;  . LOOP RECORDER IMPLANT N/A 11/10/2012   Medtronic LinQ implanted by Dr Rayann Heman for afib management  . [place] SURGERY  2000; 2002  . POSTERIOR LUMBAR FUSION  2004  . PROSTATE SURGERY  ~ 2009  . PVI/ CTI ablation  02/25/2009 and 09/2009   S/P afib/ CTI ablation  . TIBIA FRACTURE SURGERY Right 1990   "broke in 3 places; had rod put in" (11/08/2012)  . TIBIA HARDWARE REMOVAL Right ~ 1991  . TONSILLECTOMY  1965  . TOTAL HIP ARTHROPLASTY Right 12/18/2014   Procedure: RIGHT TOTAL HIP ARTHROPLASTY ANTERIOR APPROACH;  Surgeon: Gaynelle Arabian, MD;  Location: [facility] ORS;  Service: Orthopedics;  Laterality: Right;    Family History  Problem Relation Age of Onset  . Emphysema Mother   . Heart attack Father        CVA  . Hypertension Father   . Diabetes Father   . Heart disease Father        MI  . Emphysema Maternal Grandfather        Smoker  . Heart disease Paternal Grandfather   . Colon cancer Neg Hx     Social History   Tobacco Use  . Smoking status: Former Smoker    Packs/day: 3.00    Years: 38.00    Pack years: 114.00    Types: Cigarettes    Last attempt to quit: 03/25/2004    Years since quitting: 13.9  . Smokeless tobacco: Former Systems developer    Types: Chew    Quit date: 04/25/2004  Substance Use Topics  . Alcohol use: Yes    Comment: 11/08/2012 "quit drinking in 1989"  . Drug use: No    Current Outpatient Medications  Medication Sig Dispense Refill  . apixaban (ELIQUIS) 5 MG TABS tablet Take 1 tablet (5 mg total) by mouth 2 (two) times daily.  180 tablet 3  . atorvastatin (LIPITOR) 40 MG tablet Take 40 mg by mouth at bedtime.     . Cyanocobalamin (VITAMIN B-12 IJ) Inject 1,000 mcg as directed every 30 (thirty) days.    Marland Kitchen docusate sodium (COLACE) 100 MG capsule Take 100 mg by mouth daily.    Marland Kitchen escitalopram (LEXAPRO) 10 MG tablet Take 10 mg by mouth daily.  1  . fenofibrate 160 MG tablet Take 160 mg by mouth daily.      . ferrous sulfate 325 (65 FE) MG tablet Take 325 mg by mouth daily with breakfast.    . furosemide (LASIX) 20 MG tablet Take 40 mg by mouth daily.     Marland Kitchen gabapentin (NEURONTIN) 100 MG capsule Take 100 mg by mouth  3 (three) times daily.    . Melatonin 10 MG TABS Take 1 tablet by mouth at bedtime.    . metFORMIN (GLUCOPHAGE) 500 MG tablet Take 500 mg by mouth 2 (two) times daily.    . metoprolol succinate (TOPROL-XL) 50 MG 24 hr tablet TAKE 1 TABLET BY MOUTH TWICE A DAY 60 tablet 3  . nitroGLYCERIN (NITROSTAT) 0.4 MG SL tablet Place 1 tablet (0.4 mg total) under the tongue every 5 (five) minutes x 3 doses as needed for chest pain. 25 tablet 1  . oxyCODONE-acetaminophen (PERCOCET) 10-325 MG tablet Take 1 tablet by mouth every 6 (six) hours as needed for pain.    . OXYGEN Inhale 2 L into the lungs as needed.    . pantoprazole (PROTONIX) 40 MG tablet Take 40 mg by mouth daily.      Marland Kitchen spironolactone (ALDACTONE) 25 MG tablet Take 25 mg by mouth daily.  1  . Tamsulosin HCl (FLOMAX) 0.4 MG CAPS Take 0.4 mg by mouth daily.      . traZODone (DESYREL) 50 MG tablet Take 1 tablet by mouth at bedtime.     No current facility-administered medications for this visit.     Allergies  Allergen Reactions  . Penicillins Hives and Shortness Of Breath    Has patient had a PCN reaction causing immediate rash, facial/tongue/throat swelling, SOB or lightheadedness with hypotension: Yes Has patient had a PCN reaction causing severe rash involving mucus membranes or skin necrosis: No Has patient had a PCN reaction that required  hospitalization: Yes Has patient had a PCN reaction occurring within the last 10 years: No If all of the above answers are "NO", then may proceed with Cephalosporin use.     Review of Systems:  neg     Physical Exam:    BP 90/64   Pulse 71   Ht 6' (1.829 m)   Wt 234 lb 6 oz (106.3 kg)   SpO2 95%   BMI 31.79 kg/m  Filed Weights   03/16/18 1108  Weight: 234 lb 6 oz (106.3 kg)   Constitutional:  Well-developed, in no acute distress. Psychiatric: Normal mood and affect. Behavior is normal. HEENT: Pupils normal.  Conjunctivae are normal. No scleral icterus. Neck supple.  Cardiovascular: Normal rate, regular rhythm. No edema Pulmonary/chest: Effort normal and breath sounds normal. No wheezing, rales or rhonchi. Abdominal: Soft, nondistended. Nontender. Bowel sounds active throughout. There are no masses palpable. No hepatomegaly. Rectal:  defered Neurological: Alert and oriented to person place and time. Skin: Skin is warm and dry. No rashes noted.  Data Reviewed: I have personally reviewed following labs and imaging studies  CBC Latest Ref Rng & Units 12/17/2016 12/20/2014 12/19/2014  WBC 4.0 - 10.5 K/uL 7.9 8.0 14.7(H)  Hemoglobin 13.0 - 17.0 g/dL 14.0 11.8(L) 14.3  Hematocrit 39.0 - 52.0 % 41.5 36.0(L) 43.0  Platelets 150 - 400 K/uL 99(L) 149(L) 171     CBC: CBC Latest Ref Rng & Units 12/17/2016 12/20/2014 12/19/2014  WBC 4.0 - 10.5 K/uL 7.9 8.0 14.7(H)  Hemoglobin 13.0 - 17.0 g/dL 14.0 11.8(L) 14.3  Hematocrit 39.0 - 52.0 % 41.5 36.0(L) 43.0  Platelets 150 - 400 K/uL 99(L) 149(L) 171    CMP: CMP Latest Ref Rng & Units 12/12/2017 12/17/2016 12/20/2014  Glucose 70 - 99 mg/dL 398(H) 169(H) 168(H)  BUN 6 - 23 mg/dL 13 14 14   Creatinine 0.40 - 1.50 mg/dL 1.41 1.24 0.82  Sodium 135 - 145 mEq/L 134(L) 134(L) 139  Potassium 3.5 -  5.1 mEq/L 3.8 3.9 3.9  Chloride 96 - 112 mEq/L 96 97(L) 103  CO2 19 - 32 mEq/L 28 28 25   Calcium 8.4 - 10.5 mg/dL 8.7 8.7(L) 8.2(L)    Total Protein 6.0 - 8.3 g/dL 6.5 - -  Total Bilirubin 0.2 - 1.2 mg/dL 0.5 - -  Alkaline Phos 39 - 117 U/L 37(L) - -  AST 0 - 37 U/L 17 - -  ALT 0 - 53 U/L 19 - -  Discussed above in detail with the patient and patient's wife.   Carmell Austria, MD 03/16/2018, 11:35 AM  Cc: [name], MD

## 2018-03-17 ENCOUNTER — Other Ambulatory Visit: Payer: Medicare Other

## 2018-03-17 ENCOUNTER — Other Ambulatory Visit: Payer: Self-pay

## 2018-03-17 DIAGNOSIS — K859 Acute pancreatitis without necrosis or infection, unspecified: Secondary | ICD-10-CM

## 2018-03-20 LAB — CEA: CEA: <0.5 ng/mL

## 2018-03-20 LAB — CANCER ANTIGEN 19-9: CA 19-9: 14 U/mL (ref <34)

## 2018-03-22 ENCOUNTER — Ambulatory Visit (HOSPITAL_COMMUNITY)
Admission: RE | Admit: 2018-03-22 | Discharge: 2018-03-22 | Disposition: A | Discharge status: Home / Self Care | Payer: Medicare Other | Source: Ambulatory Visit | Attending: Gastroenterology | Admitting: Gastroenterology

## 2018-03-22 ENCOUNTER — Other Ambulatory Visit: Payer: Self-pay | Admitting: Gastroenterology

## 2018-03-22 DIAGNOSIS — Z8719 Personal history of other diseases of the digestive system: Secondary | ICD-10-CM | POA: Diagnosis present

## 2018-03-22 DIAGNOSIS — K863 Pseudocyst of pancreas: Secondary | ICD-10-CM

## 2018-03-22 DIAGNOSIS — K859 Acute pancreatitis without necrosis or infection, unspecified: Secondary | ICD-10-CM | POA: Diagnosis present

## 2018-03-22 MED ORDER — GADOBUTROL 1 MMOL/ML IV SOLN
10.0000 mL | Freq: Once | INTRAVENOUS | Status: AC | PRN
  Administered 2018-03-22: 10 mL via INTRAVENOUS

## 2018-03-24 ENCOUNTER — Other Ambulatory Visit: Payer: Self-pay

## 2018-03-24 DIAGNOSIS — K859 Acute pancreatitis without necrosis or infection, unspecified: Secondary | ICD-10-CM

## 2018-03-24 DIAGNOSIS — K863 Pseudocyst of pancreas: Secondary | ICD-10-CM

## 2018-03-28 ENCOUNTER — Ambulatory Visit (HOSPITAL_BASED_OUTPATIENT_CLINIC_OR_DEPARTMENT_OTHER)
Admission: RE | Admit: 2018-03-28 | Discharge: 2018-03-28 | Disposition: A | Discharge status: Home / Self Care | Payer: Medicare Other | Source: Ambulatory Visit | Attending: Gastroenterology | Admitting: Gastroenterology

## 2018-03-28 ENCOUNTER — Ambulatory Visit (HOSPITAL_BASED_OUTPATIENT_CLINIC_OR_DEPARTMENT_OTHER): Payer: Medicare Other

## 2018-03-28 DIAGNOSIS — K863 Pseudocyst of pancreas: Secondary | ICD-10-CM | POA: Insufficient documentation

## 2018-03-28 MED ORDER — IOPAMIDOL (ISOVUE-370) INJECTION 76%
100.0000 mL | Freq: Once | INTRAVENOUS | Status: AC | PRN
  Administered 2018-03-28: 80 mL via INTRAVENOUS

## 2018-04-11 ENCOUNTER — Encounter: Payer: Self-pay | Admitting: Gastroenterology

## 2018-04-11 ENCOUNTER — Ambulatory Visit: Payer: Medicare Other | Admitting: Gastroenterology

## 2018-04-11 ENCOUNTER — Telehealth: Payer: Self-pay | Admitting: Gastroenterology

## 2018-04-11 ENCOUNTER — Other Ambulatory Visit: Payer: Self-pay

## 2018-04-11 VITALS — BP 112/60 | HR 80 | Temp 97.3°F | Ht 71.5 in | Wt 239.0 lb

## 2018-04-11 DIAGNOSIS — K7460 Unspecified cirrhosis of liver: Secondary | ICD-10-CM | POA: Diagnosis not present

## 2018-04-11 DIAGNOSIS — R935 Abnormal findings on diagnostic imaging of other abdominal regions, including retroperitoneum: Secondary | ICD-10-CM | POA: Diagnosis not present

## 2018-04-11 DIAGNOSIS — K862 Cyst of pancreas: Secondary | ICD-10-CM

## 2018-04-11 DIAGNOSIS — K8500 Idiopathic acute pancreatitis without necrosis or infection: Secondary | ICD-10-CM

## 2018-04-11 NOTE — Telephone Encounter (Signed)
The pt was advised that to get the correct images he would need to drink the contrast.  Pt agreed

## 2018-04-11 NOTE — Progress Notes (Addendum)
[place] VISIT   Primary Care Provider [name], MD [address] [place] [address] [phone]  Referring Provider Dr. Lyndel Safe  Patient Profile: Steve Anthony is a 68 y.o. male with a pmh significant for CAD, paroxysmal A. fib (on apixaban) arthritis/DJD, COPD, hyperlipidemia, GERD, hypertension, hyperlipidemia, diabetes, sleep apnea, idiopathic pancreatitis (complicated by possible pseudocysts), possible cirrhosis.  The patient presents to the [facility] for an evaluation and management of problem(s) noted below:  Problem List 1. Pancreatic cyst   2. Idiopathic acute pancreatitis without infection or necrosis   3. Abnormal MRI of abdomen   4. Cirrhosis of liver without ascites, unspecified hepatic cirrhosis type ([place])     History of Present Illness: This is a patient that Dr. Lyndel Safe has asked me to evaluate for the possibility of cyst gastrostomy creation in the setting of a presumed pseudocyst that has been symptomatic to the patient.  His history is well-documented in Dr. Steve Rattler prior consultation and progress notes.  In brief, this is a patient who is been admitted within the last year on at least 2 occasions with acute recurrent idiopathic pancreatitis.  He has cross-sectional imaging going back to 2019 showing a cystlike lesion in the tail of the pancreas.  More recently after a recurrent bout of pancreatitis he was seen by Dr. grouped in follow-up.  His history at the time in the midportion of February was suggestive of early satiety and he had been partially compliant with a low-fat diet.  He was not having any issues of nausea or vomiting.  He has had no jaundice or fevers.  He is dealt with issues of chronic constipation for years which he is attributed to narcotic use for his chronic back pain at times this is improved with stool softeners and may require laxatives.  The patient underwent cross-sectional imaging and a  follow-up MRI in February and subsequently was referred to me to discuss the possibility of pseudocyst management.  Today, the patient is accompanied by his wife and overall feels much improved and better than he has for many months.  His weight has been stable to actually increasing.  He denies any nausea or vomiting.  He eats well and does not feel that he is getting full faster than normal.  He has had no fevers or chills.  On recent imaging there was concern for the possibility of cirrhotic morphology on his imaging study.  He has never been told previously that he may have cirrhosis.  He has not drank alcohol in many years.  GI Review of Systems Positive as above Negative for dysphagia, odynophagia, jaundice, change in bowel habits, melena, hematochezia  Review of Systems General: Denies fevers/chills/weight loss HEENT: Denies oral lesions Cardiovascular: Denies chest pain Pulmonary: Denies shortness of breath Gastroenterological: See HPI Genitourinary: Denies darkened urine Hematological: Denies easy bruising Dermatological: Denies jaundice Psychological: Mood is stable   Medications Current Outpatient Medications  Medication Sig Dispense Refill   apixaban (ELIQUIS) 5 MG TABS tablet Take 1 tablet (5 mg total) by mouth 2 (two) times daily. 180 tablet 3   atorvastatin (LIPITOR) 40 MG tablet Take 40 mg by mouth at bedtime.      Cyanocobalamin (VITAMIN B-12 IJ) Inject 1,000 mcg as directed every 30 (thirty) days.     escitalopram (LEXAPRO) 10 MG tablet Take 10 mg by mouth daily.  1   fenofibrate 160 MG tablet Take 160 mg by mouth daily.  ferrous sulfate 325 (65 FE) MG tablet Take 325 mg by mouth daily with breakfast.     furosemide (LASIX) 20 MG tablet Take 40 mg by mouth daily.      gabapentin (NEURONTIN) 100 MG capsule Take 100 mg by mouth 3 (three) times daily.     Melatonin 10 MG TABS Take 1 tablet by mouth at bedtime.     metFORMIN (GLUCOPHAGE) 500 MG tablet Take  500 mg by mouth 2 (two) times daily.     metoprolol succinate (TOPROL-XL) 50 MG 24 hr tablet TAKE 1 TABLET BY MOUTH TWICE A DAY 60 tablet 3   nitroGLYCERIN (NITROSTAT) 0.4 MG SL tablet Place 1 tablet (0.4 mg total) under the tongue every 5 (five) minutes x 3 doses as needed for chest pain. 25 tablet 1   oxyCODONE-acetaminophen (PERCOCET) 10-325 MG tablet Take 1 tablet by mouth every 6 (six) hours as needed for pain.     OXYGEN Inhale 2 L into the lungs as needed.     pantoprazole (PROTONIX) 40 MG tablet Take 40 mg by mouth daily.       spironolactone (ALDACTONE) 25 MG tablet Take 25 mg by mouth daily.  1   Tamsulosin HCl (FLOMAX) 0.4 MG CAPS Take 0.4 mg by mouth daily.       traZODone (DESYREL) 50 MG tablet Take 1 tablet by mouth at bedtime.     No current facility-administered medications for this visit.     Allergies Allergies  Allergen Reactions   Penicillins Hives and Shortness Of Breath    Has patient had a PCN reaction causing immediate rash, facial/tongue/throat swelling, SOB or lightheadedness with hypotension: Yes Has patient had a PCN reaction causing severe rash involving mucus membranes or skin necrosis: No Has patient had a PCN reaction that required hospitalization: Yes Has patient had a PCN reaction occurring within the last 10 years: No If all of the above answers are "NO", then may proceed with Cephalosporin use.     Histories Past Medical History:  Diagnosis Date   Arthritis    CAD (coronary artery disease)    a. S/P PCI mid LAD (vision stent) 08/31/2004;  b. repeat cath 6/11 - no progression of CAD. c. Cath 10/2012 given moderate CAD on cardiac CT - no change from prior cath.   Chest pain 12/16/2016   COPD (chronic obstructive pulmonary disease) (HCC)    DJD (degenerative joint disease) of lumbar spine    Dyslipidemia    Gallstones    GERD (gastroesophageal reflux disease)    Hypertension    Hyperthyroidism    Melanoma ([place])    melanoma  removed from neck    Nephrolithiasis    On home oxygen therapy    "2L q hs" (11/08/2012)   OSA (obstructive sleep apnea)    a. cpap noncompliance- patient reports on 12/10/14- does not use CPAP   Palpitations    a. 10/2012 - s/p LINQ loop recorder to assess for arrhythmia.    Paroxysmal A-fib ([place])    a. S/P PVI 2/11 and 9/11   SVT (supraventricular tachycardia) ([place])    a. Atypical AVNRT of the slow pathway   Type II diabetes mellitus [facility])    Past Surgical History:  Procedure Laterality Date   ATRIAL ABLATION SURGERY  2007   S/P slow pathway ablation for atypical AVNRT    CHOLECYSTECTOMY  ~ 2008   COLON RESECTION  02/2013    COLONOSCOPY  12/25/2007   Colonic polyp status post polypectomy. Internal  hemorrhoids.    CORONARY ANGIOPLASTY WITH STENT PLACEMENT  08/30/2004   Vision; to LAD    CYSTOSCOPY W/ STONE MANIPULATION  ~ 1998   "once" (11/08/2012)   EP Study  2008   Negative EP study in [place] N/A 11/10/2012   Procedure: LEFT HEART CATHETERIZATION WITH CORONARY ANGIOGRAM;  Surgeon: Burnell Blanks, MD;  Location: [facility] CATH LAB;  Service: Cardiovascular;  Laterality: N/A;   LOOP RECORDER IMPLANT N/A 11/10/2012   Medtronic LinQ implanted by Dr Rayann Heman for afib management   LUMBAR [place]  2000; 2002   POSTERIOR LUMBAR FUSION  2004   PROSTATE SURGERY  ~ 2009   PVI/ CTI ablation  02/25/2009 and 09/2009   S/P afib/ CTI ablation   TIBIA FRACTURE SURGERY Right 1990   "broke in 3 places; had rod put in" (11/08/2012)   TIBIA HARDWARE REMOVAL Right ~ [place] Right 12/18/2014   Procedure: RIGHT TOTAL HIP ARTHROPLASTY ANTERIOR APPROACH;  Surgeon: Gaynelle Arabian, MD;  Location: [facility] ORS;  Service: Orthopedics;  Laterality: Right;   Social History   Socioeconomic History   Marital status: Married    Spouse name: Not on file   Number  of children: 1   Years of education: Not on file   Highest education level: Not on file  Occupational History   Occupation: DISABLED    Employer: DISABLED   Occupation: Retired DOT truck Solicitor strain: Not on file   Food insecurity:    Worry: Not on file    Inability: Not on file   Transportation needs:    Medical: Not on file    Non-medical: Not on file  Tobacco Use   Smoking status: Former Smoker    Packs/day: 3.00    Years: 38.00    Pack years: 114.00    Types: Cigarettes    Last attempt to quit: 03/25/2004    Years since quitting: 14.0   Smokeless tobacco: Former Systems developer    Types: [place] date: 04/25/2004  Substance and Sexual Activity   Alcohol use: Yes    Comment: 11/08/2012 "quit drinking in 1989"   Drug use: No   Sexual activity: Not Currently  Lifestyle   Physical activity:    Days per week: Not on file    Minutes per session: Not on file   Stress: Not on file  Relationships   Social connections:    Talks on phone: Not on file    Gets together: Not on file    Attends religious service: Not on file    Active member of club or organization: Not on file    Attends meetings of clubs or organizations: Not on file    Relationship status: Not on file   Intimate partner violence:    Fear of current or ex partner: Not on file    Emotionally abused: Not on file    Physically abused: Not on file    Forced sexual activity: Not on file  Other Topics Concern   Not on file  Social History Narrative   Resides in [place] with his wife   Two children and two grandchildren   Attends [employer]   Family History  Problem Relation Age of Onset   Emphysema Mother    Heart attack Father  CVA   Hypertension Father    Diabetes Father    Heart disease Father        MI   Emphysema Maternal Grandfather        Smoker   Heart disease Paternal Grandfather    Colon cancer Neg Hx     Esophageal cancer Neg Hx    Inflammatory bowel disease Neg Hx    Liver disease Neg Hx    Pancreatic cancer Neg Hx    Rectal cancer Neg Hx    Stomach cancer Neg Hx    I have reviewed his medical, social, and family history in detail and updated the electronic medical record as necessary.    PHYSICAL EXAMINATION  BP 112/60    Pulse 80    Temp (!) 97.3 F (36.3 C) (Temporal)    Ht 5' 11.5" (1.816 m)    Wt 239 lb (108.4 kg)    BMI 32.87 kg/m  Wt Readings from Last 3 Encounters:  04/11/18 239 lb (108.4 kg)  03/16/18 234 lb 6 oz (106.3 kg)  01/09/18 246 lb (111.6 kg)  GEN: NAD, appears stated age, doesn't appear chronically ill PSYCH: Cooperative, without pressured speech EYE: Conjunctivae pink, sclerae anicteric ENT: MMM, without oral ulcers NECK: Supple CV: RR without R/Gs  RESP: CTAB posteriorly, without wheezing GI: NABS, soft, protuberant, distended, nontender, without rebound or guarding, No hepatosplenomegaly is not appreciated due to body habitus MSK/EXT: Trace bilateral lower extremity edema SKIN: No jaundice, no spider angiomata NEURO:  Alert & Oriented x 3, no focal deficits, no evidence of asterixis   REVIEW OF DATA  I reviewed the following data at the time of this encounter:  GI Procedures and Studies  No relevant studies to review  Laboratory Studies  Reviewed in epic  Imaging Studies  March 28, 2018 CT abdomen pelvis with without contrast IMPRESSION: 1. Since the MRI of 03/22/2018, slight decrease in size of a complex fluid collection surrounding the pancreatic tail, likely evolving pseudocyst. Adjacent smaller presumed pseudocysts are similar. 2. No definite acute pancreatitis. Improvement in possible focal necrosis involving the pancreatic head. 3. Cirrhosis, steatosis, hepatosplenomegaly, and portal venous hypertension. 4.  Aortic Atherosclerosis (ICD10-I70.0). 5. Degraded evaluation of the pelvis, secondary to beam hardening artifact from right  hip arthroplasty.  March 22, 2018 MRI abdomen/MRCP IMPRESSION: 1. Hemorrhagic pseudocyst arising from tail of pancreas is again noted. This has a volume of 150 cc, decreased from 190 cc previously. 2. Cluster of smaller pseudo cysts lateral to the pancreas along the inferior margin of the spleen are decreased in size from previous exam. 3. Areas of relative hypoenhancement within the head of pancreas may represent foci of necrosis secondary to pancreatitis. 4. Hepatic steatosis.   ASSESSMENT  Mr. Vora is a 68 y.o. male  with a pmh significant for CAD, paroxysmal A. fib (on apixaban) arthritis/DJD, COPD, hyperlipidemia, GERD, hypertension, hyperlipidemia, diabetes, sleep apnea, idiopathic pancreatitis (complicated by possible pseudocysts), possible cirrhosis.   The patient is seen today for evaluation and management of:  1. Pancreatic cyst   2. Idiopathic acute pancreatitis without infection or necrosis   3. Abnormal MRI of abdomen   4. Cirrhosis of liver without ascites, unspecified hepatic cirrhosis type [facility])    This is a hemodynamically stable patient who presents for evaluation of a pancreatic pseudocyst versus hemorrhagic pseudocyst and consideration of management options.  Today, the patient is doing exceedingly well and states that over the course the last few weeks that he has had continued improvement  in her symptoms.  I do think that the patient will benefit from an endoscopic ultrasound at some point to evaluate for other potential etiologies of his recurrent idiopathic pancreatitis.  He does not have evidence of pancreatic divisum on MRI imaging.  Interestingly, back in 2019 there is imaging that shows a cyst in the tail of the pancreas present without any pancreatitis.  The likelihood of having a IPMN causing issues seems low but we may need to think about cystic lesions that could be causing problems.  I would have suspected an IPMN however to improve slowly and decrease in  size.  Thus, we will plan a diagnostic EUS after a repeat CT scan.  I like to see if things continue to decrease in size and thus would like to wait 4 to 6 weeks if possible.  We will be able to rule out microlithiasis or sludge is causing issues although he has not had 2 elevation in liver tests per the notation by Dr. Lyndel Safe.  I would not pursue a cyst gastrostomy at this time either.  After reviewing the images I am concerned about whether an axials cyst gastrostomy could even be created because of the wall of the cyst and around the stomach look to be greater than 1 cm apart in the actual lumen opposing metal stent needs to have no more than a 7 to 8 mm size differential to try and decrease the risk of perforation.  We could always do a diagnostic sampling of the area if necessary however I would not want to infect this if he is doing well I do not think that we need to do that at this point.  The patient and wife agree with this plan of action.  I have also discussed with him that when we do pursue an EUS which likely will be determined after his repeat CT scan he will need an endoscopy because of his concern for underlying cirrhosis.  I will defer further management options of that to Dr..  The risks of EUS including bleeding, infection, aspiration pneumonia and intestinal perforation were discussed as was the possibility it may not give a definitive diagnosis.  If a biopsy of the pancreas is done as part of the EUS, there is an additional risk of pancreatitis at the rate of about 1%.  It was explained that procedure related pancreatitis is typically mild, although can be severe and even life threatening, which is why we do not perform random pancreatic biopsies and only biopsy a lesion we feel is concerning enough to warrant the risk.  All patient questions were answered, to the best of my ability, and the patient agrees to the aforementioned plan of action with follow-up as indicated.   PLAN  Repeat CT  abdomen with/without contrast in approximately 4 to 6 weeks to evaluate whether the cyst has continued to decrease in size Diagnostic endoscopic ultrasound will be considered based on findings Will need an upper endoscopy to evaluate for portal hypertensive changes as well as esophageal varices   Orders Placed This Encounter  Procedures   CT ABDOMEN W CONTRAST   BUN/Creatinine Ratio    New Prescriptions   No medications on file   Modified Medications   No medications on file    Planned Follow Up: No follow-ups on file.   Justice Britain, MD [facility] Office # [phone]

## 2018-04-11 NOTE — Patient Instructions (Addendum)
If you are age 67 or older, your body mass index should be between 23-30. Your Body mass index is 32.87 kg/m. If this is out of the aforementioned range listed, please consider follow up with your Primary Care Provider.  If you are age 35 or younger, your body mass index should be between 19-25. Your Body mass index is 32.87 kg/m. If this is out of the aformentioned range listed, please consider follow up with your Primary Care Provider.     Your provider has requested that you go to the basement level for lab work the week before CT (05/05/2018) between 7:30am- 5:00pm. Press "B" on the elevator. The lab is located at the first door on the left as you exit the elevator.   You have been scheduled for a CT scan of the abdomen and pelvis at [facility] ([address]---this is in the same building as Press photographer).   You are scheduled on 05/12/2018 at 1:30pm. You should arrive 15 minutes prior to your appointment time for registration. Please follow the written instructions below on the day of your exam:  WARNING: IF YOU ARE ALLERGIC TO IODINE/X-RAY DYE, PLEASE NOTIFY RADIOLOGY IMMEDIATELY AT [phone]! YOU WILL BE GIVEN A 13 HOUR PREMEDICATION PREP.  1) Do not eat or drink anything after 9:30am (4 hours prior to your test) 2) You have been given 2 bottles of oral contrast to drink. The solution may taste better if refrigerated, but do NOT add ice or any other liquid to this solution. Shake well before drinking.    Drink 1 bottle of contrast @ 11:30am (2 hours prior to your exam)  Drink 1 bottle of contrast @ 12:30pm (1 hour prior to your exam)  You may take any medications as prescribed with a small amount of water, if necessary. If you take any of the following medications: METFORMIN, GLUCOPHAGE, GLUCOVANCE, AVANDAMET, RIOMET, FORTAMET, [place] MET, JANUMET, GLUMETZA or METAGLIP, you MAY be asked to HOLD this medication 48 hours AFTER the exam.  The purpose of you  drinking the oral contrast is to aid in the visualization of your intestinal tract. The contrast solution may cause some diarrhea. Depending on your individual set of symptoms, you may also receive an intravenous injection of x-ray contrast/dye. Plan on being at [facility] for 30 minutes or longer, depending on the type of exam you are having performed.  This test typically takes 30-45 minutes to complete.  If you have any questions regarding your exam or if you need to reschedule, you may call the CT department at [phone] between the hours of 8:00 am and 5:00 pm, Monday-Friday.  ________________________________________________________________________  Thank you for choosing me and [facility].  Dr. Rush Landmark

## 2018-04-11 NOTE — Telephone Encounter (Signed)
Pt called and stated that he is having a ct of the abd. And Dr.Mansouraty provided him liquid to drink for the procedure. He wants to know if he can just have the contrast in his IV instead of having to drink the liquid

## 2018-04-14 ENCOUNTER — Encounter: Payer: Self-pay | Admitting: Gastroenterology

## 2018-04-14 DIAGNOSIS — K8500 Idiopathic acute pancreatitis without necrosis or infection: Secondary | ICD-10-CM

## 2018-04-14 DIAGNOSIS — K7460 Unspecified cirrhosis of liver: Secondary | ICD-10-CM | POA: Insufficient documentation

## 2018-04-14 DIAGNOSIS — K862 Cyst of pancreas: Secondary | ICD-10-CM

## 2018-04-14 DIAGNOSIS — R935 Abnormal findings on diagnostic imaging of other abdominal regions, including retroperitoneum: Secondary | ICD-10-CM | POA: Insufficient documentation

## 2018-04-14 HISTORY — DX: Idiopathic acute pancreatitis without necrosis or infection: K85.00

## 2018-04-14 HISTORY — DX: Abnormal findings on diagnostic imaging of other abdominal regions, including retroperitoneum: R93.5

## 2018-04-14 HISTORY — DX: Unspecified cirrhosis of liver: K74.60

## 2018-04-14 HISTORY — DX: Cyst of pancreas: K86.2

## 2018-04-17 ENCOUNTER — Telehealth: Payer: Self-pay | Admitting: Internal Medicine

## 2018-04-17 NOTE — Telephone Encounter (Signed)
New Message    Pt c/o medication issue:  1. Name of Medication: apixaban (ELIQUIS) 5 MG TABS tablet   2. How are you currently taking this medication (dosage and times per day)?   3. Are you having a reaction (difficulty breathing--STAT)?   4. What is your medication issue? Patient states that he is having more nose bleeds and when he went to the restroom his stool is real dark. Please call

## 2018-04-18 NOTE — Telephone Encounter (Signed)
Returned call to Pt.  Per Pt he is not having any problems today.  Pt states he has a long history of headaches, he has tried to take Tylenol without relief.  Uses BC powder when tylenol does not work.    Pt states he had 2 small nosebleeds over the weekend, and then when he was bent over for a few minutes he had a little blood trickle out of his right nostril.  He had one bowel movement that was darker than his normal.  (Pt states he takes iron supplement twice a week).  Discussed with DOD.  Have Pt avoid BC powder.  Continue to monitor symptoms and call office if they worsen.  Notified Pt.  Pt states when he has these headaches he needs to lay in dark quiet room.  Advised Pt he may be having migraines.  Pt to call his PCP for tx.

## 2018-04-18 NOTE — Telephone Encounter (Signed)
Pt called again this morning. He is still haaving the same problems, and has  been having headaches that don't go away with just tylenol. He took a [facility] and wondered if that was part of the problem. He is worried that he just missed a call from the nurse

## 2018-05-09 ENCOUNTER — Telehealth: Payer: Self-pay | Admitting: *Deleted

## 2018-05-09 ENCOUNTER — Other Ambulatory Visit: Payer: Self-pay

## 2018-05-09 ENCOUNTER — Other Ambulatory Visit: Payer: Medicare Other

## 2018-05-09 DIAGNOSIS — K862 Cyst of pancreas: Secondary | ICD-10-CM

## 2018-05-09 DIAGNOSIS — K7460 Unspecified cirrhosis of liver: Secondary | ICD-10-CM

## 2018-05-09 DIAGNOSIS — K859 Acute pancreatitis without necrosis or infection, unspecified: Secondary | ICD-10-CM

## 2018-05-09 DIAGNOSIS — E781 Pure hyperglyceridemia: Secondary | ICD-10-CM

## 2018-05-09 DIAGNOSIS — K863 Pseudocyst of pancreas: Secondary | ICD-10-CM

## 2018-05-09 DIAGNOSIS — Z8719 Personal history of other diseases of the digestive system: Secondary | ICD-10-CM

## 2018-05-09 DIAGNOSIS — R935 Abnormal findings on diagnostic imaging of other abdominal regions, including retroperitoneum: Secondary | ICD-10-CM

## 2018-05-09 DIAGNOSIS — K8500 Idiopathic acute pancreatitis without necrosis or infection: Secondary | ICD-10-CM

## 2018-05-09 NOTE — Telephone Encounter (Signed)
Covid-19 travel screening questions  Have you traveled in the last 14 days? If yes where? No Do you now or have you had a fever in the last 14 days? No Do you have any respiratory symptoms of shortness of breath or cough now or in the last 14 days? No Do you have any family members or close contacts with diagnosed or suspected Covid-19? No      

## 2018-05-10 ENCOUNTER — Other Ambulatory Visit (INDEPENDENT_AMBULATORY_CARE_PROVIDER_SITE_OTHER): Payer: Medicare Other

## 2018-05-10 ENCOUNTER — Other Ambulatory Visit: Payer: Self-pay

## 2018-05-10 ENCOUNTER — Ambulatory Visit (INDEPENDENT_AMBULATORY_CARE_PROVIDER_SITE_OTHER)
Admission: RE | Admit: 2018-05-10 | Discharge: 2018-05-10 | Disposition: A | Discharge status: Home / Self Care | Payer: Medicare Other | Source: Ambulatory Visit | Attending: Gastroenterology | Admitting: Gastroenterology

## 2018-05-10 DIAGNOSIS — K8500 Idiopathic acute pancreatitis without necrosis or infection: Secondary | ICD-10-CM

## 2018-05-10 DIAGNOSIS — Z8719 Personal history of other diseases of the digestive system: Secondary | ICD-10-CM

## 2018-05-10 DIAGNOSIS — R935 Abnormal findings on diagnostic imaging of other abdominal regions, including retroperitoneum: Secondary | ICD-10-CM

## 2018-05-10 DIAGNOSIS — K7460 Unspecified cirrhosis of liver: Secondary | ICD-10-CM | POA: Diagnosis not present

## 2018-05-10 DIAGNOSIS — E781 Pure hyperglyceridemia: Secondary | ICD-10-CM

## 2018-05-10 DIAGNOSIS — K862 Cyst of pancreas: Secondary | ICD-10-CM

## 2018-05-10 DIAGNOSIS — K859 Acute pancreatitis without necrosis or infection, unspecified: Secondary | ICD-10-CM

## 2018-05-10 DIAGNOSIS — K863 Pseudocyst of pancreas: Secondary | ICD-10-CM

## 2018-05-10 LAB — CREATININE, SERUM: Creatinine, Ser: 1.73 mg/dL — ABNORMAL HIGH (ref 0.40–1.50)

## 2018-05-10 LAB — BUN: BUN: 19 mg/dL (ref 6–23)

## 2018-05-10 MED ORDER — IOHEXOL 300 MG/ML  SOLN
80.0000 mL | Freq: Once | INTRAMUSCULAR | Status: AC | PRN
  Administered 2018-05-10: 80 mL via INTRAVENOUS

## 2018-05-12 ENCOUNTER — Inpatient Hospital Stay: Admission: RE | Admit: 2018-05-12 | Payer: Medicare Other | Source: Ambulatory Visit

## 2018-05-23 ENCOUNTER — Telehealth: Payer: Self-pay

## 2018-05-23 ENCOUNTER — Ambulatory Visit (INDEPENDENT_AMBULATORY_CARE_PROVIDER_SITE_OTHER): Payer: Medicare Other | Admitting: Gastroenterology

## 2018-05-23 ENCOUNTER — Other Ambulatory Visit: Payer: Self-pay

## 2018-05-23 DIAGNOSIS — R935 Abnormal findings on diagnostic imaging of other abdominal regions, including retroperitoneum: Secondary | ICD-10-CM | POA: Diagnosis not present

## 2018-05-23 DIAGNOSIS — K859 Acute pancreatitis without necrosis or infection, unspecified: Secondary | ICD-10-CM | POA: Diagnosis not present

## 2018-05-23 DIAGNOSIS — K862 Cyst of pancreas: Secondary | ICD-10-CM

## 2018-05-23 DIAGNOSIS — IMO0002 Reserved for concepts with insufficient information to code with codable children: Secondary | ICD-10-CM

## 2018-05-23 NOTE — Progress Notes (Signed)
[place] VISIT   Primary Care Provider [name], MD [facility] [place] [address] [phone]   Patient Profile: Steve Anthony is a 68 y.o. male with a pmh significant for CAD, paroxysmal A. fib (on apixaban) arthritis/DJD, COPD, hyperlipidemia, GERD, hypertension, hyperlipidemia, diabetes, sleep apnea, idiopathic pancreatitis (complicated by possible pseudocysts), possible cirrhosis.  The patient presents to the [facility] for an evaluation and management of problem(s) noted below:  Problem List 1. Pancreatic cyst   2. Recurrent pancreatitis   3. Abnormal CT of the abdomen     History of Present Illness: Please see initial consultation note from Dr. Lyndel Safe as well as my recent progress note for full details of HPI.  I connected with  [name]. I verified that I was speaking with the correct person using two identifiers. Due to the COVID-19 Pandemic, this service was provided via telemedicine using audio only. Interactive audio and video telecommunications were attempted between this provider and patient, however failed as the patient did not have access to video capability and thus to provide timely and excellent care, we continued and completed visit with audio only. The patient was located at home. The provider was located in the office. The patient did consent to this visit and is aware of charges through their insurance as well as the limitations of evaluation and management by telemedicine. Other persons participating in this telemedicine service were none.   Interval History The patient has been doing well overall.  He is maintaining his current diet.  He is maintaining his weight.  He is not had any recurrent pancreatitis pain.  He wants in hopes that he does not need to have a procedure.  He is concerned about the COVID infection.  He otherwise does not have any significant complaints.  We have gone over  the results of his CAT scan as noted below.   GI Review of Systems Positive as above Negative for abdominal pain, dysphagia, odynophagia, change in bowel habits, melena, hematochezia  Review of Systems General: Denies fevers/chills/weight loss Cardiovascular: Denies chest pain Pulmonary: Denies shortness of breath Gastroenterological: See HPI Genitourinary: Denies darkened urine Hematological: Denies easy bruising Dermatological: Denies jaundice Psychological: Mood is stable   Medications Current Outpatient Medications  Medication Sig Dispense Refill   apixaban (ELIQUIS) 5 MG TABS tablet Take 1 tablet (5 mg total) by mouth 2 (two) times daily. 180 tablet 3   atorvastatin (LIPITOR) 40 MG tablet Take 40 mg by mouth at bedtime.      Cyanocobalamin (VITAMIN B-12 IJ) Inject 1,000 mcg as directed every 30 (thirty) days.     escitalopram (LEXAPRO) 10 MG tablet Take 10 mg by mouth daily.  1   fenofibrate 160 MG tablet Take 160 mg by mouth daily.       ferrous sulfate 325 (65 FE) MG tablet Take 325 mg by mouth daily with breakfast.     furosemide (LASIX) 20 MG tablet Take 40 mg by mouth daily.      gabapentin (NEURONTIN) 100 MG capsule Take 100 mg by mouth 3 (three) times daily.     Melatonin 10 MG TABS Take 1 tablet by mouth at bedtime.     metFORMIN (GLUCOPHAGE) 500 MG tablet Take 500 mg by mouth 2 (two) times daily.     metoprolol succinate (TOPROL-XL) 50 MG 24 hr tablet TAKE 1 TABLET BY MOUTH TWICE A DAY 60 tablet 3   nitroGLYCERIN (NITROSTAT) 0.4 MG SL tablet Place  1 tablet (0.4 mg total) under the tongue every 5 (five) minutes x 3 doses as needed for chest pain. 25 tablet 1   oxyCODONE-acetaminophen (PERCOCET) 10-325 MG tablet Take 1 tablet by mouth every 6 (six) hours as needed for pain.     OXYGEN Inhale 2 L into the lungs as needed.     pantoprazole (PROTONIX) 40 MG tablet Take 40 mg by mouth daily.       spironolactone (ALDACTONE) 25 MG tablet Take 25 mg by mouth  daily.  1   Tamsulosin HCl (FLOMAX) 0.4 MG CAPS Take 0.4 mg by mouth daily.       traZODone (DESYREL) 50 MG tablet Take 1 tablet by mouth at bedtime.     No current facility-administered medications for this visit.     Allergies Allergies  Allergen Reactions   Penicillins Hives and Shortness Of Breath    Has patient had a PCN reaction causing immediate rash, facial/tongue/throat swelling, SOB or lightheadedness with hypotension: Yes Has patient had a PCN reaction causing severe rash involving mucus membranes or skin necrosis: No Has patient had a PCN reaction that required hospitalization: Yes Has patient had a PCN reaction occurring within the last 10 years: No If all of the above answers are "NO", then may proceed with Cephalosporin use.     Histories Past Medical History:  Diagnosis Date   Arthritis    CAD (coronary artery disease)    a. S/P PCI mid LAD (vision stent) 08/31/2004;  b. repeat cath 6/11 - no progression of CAD. c. Cath 10/2012 given moderate CAD on cardiac CT - no change from prior cath.   Chest pain 12/16/2016   COPD (chronic obstructive pulmonary disease) (HCC)    DJD (degenerative joint disease) of lumbar spine    Dyslipidemia    Gallstones    GERD (gastroesophageal reflux disease)    Hypertension    Hyperthyroidism    Melanoma ([place])    melanoma removed from neck    Nephrolithiasis    On home oxygen therapy    "2L q hs" (11/08/2012)   OSA (obstructive sleep apnea)    a. cpap noncompliance- patient reports on 12/10/14- does not use CPAP   Palpitations    a. 10/2012 - s/p LINQ loop recorder to assess for arrhythmia.    Paroxysmal A-fib ([place])    a. S/P PVI 2/11 and 9/11   SVT (supraventricular tachycardia) ([place])    a. Atypical AVNRT of the slow pathway   Type II diabetes mellitus [facility])    Past Surgical History:  Procedure Laterality Date   ATRIAL ABLATION SURGERY  2007   S/P slow pathway ablation for atypical AVNRT     CHOLECYSTECTOMY  ~ 2008   COLON RESECTION  02/2013    COLONOSCOPY  12/25/2007   Colonic polyp status post polypectomy. Internal hemorrhoids.    CORONARY ANGIOPLASTY WITH STENT PLACEMENT  08/30/2004   Vision; to LAD    CYSTOSCOPY W/ STONE MANIPULATION  ~ 1998   "once" (11/08/2012)   EP Study  2008   Negative EP study in [place] N/A 11/10/2012   Procedure: LEFT HEART CATHETERIZATION WITH CORONARY ANGIOGRAM;  Surgeon: Burnell Blanks, MD;  Location: [facility] CATH LAB;  Service: Cardiovascular;  Laterality: N/A;   LOOP RECORDER IMPLANT N/A 11/10/2012   Medtronic LinQ implanted by Dr Rayann Heman for afib management   LUMBAR [place] SURGERY  2000; 2002   POSTERIOR  LUMBAR FUSION  2004   PROSTATE SURGERY  ~ 2009   PVI/ CTI ablation  02/25/2009 and 09/2009   S/P afib/ CTI ablation   TIBIA FRACTURE SURGERY Right 1990   "broke in 3 places; had rod put in" (11/08/2012)   TIBIA HARDWARE REMOVAL Right ~ [place] Right 12/18/2014   Procedure: RIGHT TOTAL HIP ARTHROPLASTY ANTERIOR APPROACH;  Surgeon: Gaynelle Arabian, MD;  Location: [facility] ORS;  Service: Orthopedics;  Laterality: Right;   Social History   Socioeconomic History   Marital status: Married    Spouse name: Not on file   Number of children: 1   Years of education: Not on file   Highest education level: Not on file  Occupational History   Occupation: DISABLED    Employer: DISABLED   Occupation: Retired DOT truck Solicitor strain: Not on file   Food insecurity:    Worry: Not on file    Inability: Not on file   Transportation needs:    Medical: Not on file    Non-medical: Not on file  Tobacco Use   Smoking status: Former Smoker    Packs/day: 3.00    Years: 38.00    Pack years: 114.00    Types: Cigarettes    Last attempt to quit: 03/25/2004    Years since quitting:  14.1   Smokeless tobacco: Former Systems developer    Types: [place] date: 04/25/2004  Substance and Sexual Activity   Alcohol use: Yes    Comment: 11/08/2012 "quit drinking in 1989"   Drug use: No   Sexual activity: Not Currently  Lifestyle   Physical activity:    Days per week: Not on file    Minutes per session: Not on file   Stress: Not on file  Relationships   Social connections:    Talks on phone: Not on file    Gets together: Not on file    Attends religious service: Not on file    Active member of club or organization: Not on file    Attends meetings of clubs or organizations: Not on file    Relationship status: Not on file   Intimate partner violence:    Fear of current or ex partner: Not on file    Emotionally abused: Not on file    Physically abused: Not on file    Forced sexual activity: Not on file  Other Topics Concern   Not on file  Social History Narrative   Resides in [place] with his wife   Two children and two grandchildren   Attends [employer]   Family History  Problem Relation Age of Onset   Emphysema Mother    Heart attack Father        CVA   Hypertension Father    Diabetes Father    Heart disease Father        MI   Emphysema Maternal Grandfather        Smoker   Heart disease Paternal Grandfather    Colon cancer Neg Hx    Esophageal cancer Neg Hx    Inflammatory bowel disease Neg Hx    Liver disease Neg Hx    Pancreatic cancer Neg Hx    Rectal cancer Neg Hx    Stomach cancer Neg Hx    I have reviewed his medical, social, and family history in detail and updated the electronic medical  record as necessary.    PHYSICAL EXAMINATION  Telehealth visit   REVIEW OF DATA  I reviewed the following data at the time of this encounter:  GI Procedures and Studies  No relevant studies to review  Laboratory Studies  Reviewed in epic  Imaging Studies  April 2020 CT abdomen with contrast IMPRESSION: 1.  Resolution of peripancreatic inflammatory changes adjacent to pancreatic tail since previous study. 2. Slight increase in size of 7.3 cm rim enhancing fluid collection involving the pancreatic tail, consistent with pancreatic pseudocyst. 3. Hepatic cirrhosis and mild steatosis. No evidence of hepatic neoplasm. 4. Stable splenomegaly and upper abdominal portosystemic collaterals, consistent with portal venous hypertension. 5. New focal airspace opacity in medial left lower lobe, suspicious for pneumonia. Recommend clinical correlation, and continued follow-up by chest CT in 3 months.   ASSESSMENT  Mr. Poyer is a 68 y.o. male  with a pmh significant for CAD, paroxysmal A. fib (on apixaban) arthritis/DJD, COPD, hyperlipidemia, GERD, hypertension, hyperlipidemia, diabetes, sleep apnea, idiopathic pancreatitis (complicated by possible pseudocysts), possible cirrhosis.   The patient is seen today for evaluation and management of:  1. Pancreatic cyst   2. Recurrent pancreatitis   3. Abnormal CT of the abdomen    The patient has done well since her last visit without any evidence of recurrent pancreatitis.  He is peripancreatic inflammation seems to be improved on most recent cross-sectional imaging.  However he still has a cyst at the tail of the pancreas.  Looking at the cyst it looks like it is going to be very difficult to consider a cyst gastrostomy for consideration of a possible aspiration of the cyst should be performed as this is a cyst that has now been persistent for over a year.  As well, if this is a branch duct IPMN we need to understand that as a potential cause for recurrent pancreatitis.  I think axials cyst gastrostomy is going to be difficult due to the size and length away from the gastric wall until the newer fully covered stents are available through [employer].  With that being said we can consider the role of at least an aspiration.  There are blood vessels that are in between  the cyst as well as the gastric lining so we will have to be mindful of that in the setting of a possible aspiration.    The risks of EUS including bleeding, infection, aspiration pneumonia and intestinal perforation were discussed as was the possibility it may not give a definitive diagnosis.  If a biopsy of the pancreas is done as part of the EUS, there is an additional risk of pancreatitis at the rate of about 1%.  It was explained that procedure related pancreatitis is typically mild, although can be severe and even life threatening, which is why we do not perform random pancreatic biopsies and only biopsy a lesion we feel is concerning enough to warrant the risk.we will plan to proceed with an EUS in approximately 4 to 6 weeks in effort of understanding the possibility of an IPMN as well as rule out biliary sludge/choledocholithiasis or microlithiasis  All patient questions were answered, to the best of my ability, and the patient agrees to the aforementioned plan of action with follow-up as indicated.   PLAN  Plan for EGD/EUS with possible cyst aspiration versus gastrostomy Evaluate at time of EGD for evidence of portal gastropathy or portal hypertension Continue low-fat diet Labs to be obtained 3 to 4 weeks before planned procedure as  outlined below We will attempt to get approval for being off apixaban for at least 2 days prior to procedure from patient's cardiologist   Orders Placed This Encounter  Procedures   CBC   Comp Met (CMET)   INR/PT    New Prescriptions   No medications on file   Modified Medications   No medications on file    Planned Follow Up: No follow-ups on file.   Justice Britain, MD [facility]oscopy Office # [phone]

## 2018-05-23 NOTE — Telephone Encounter (Signed)
   Primary Cardiologist: Thompson Grayer, MD  Chart reviewed as part of pre-operative protocol coverage. Patient was contacted 05/23/2018 in reference to pre-operative risk assessment for pending surgery as outlined below.  Steve Anthony was last seen on 01/09/18 by Dr. Rayann Heman.   Per our pharmacy staff: Pt takes Eliquis for afib with CHADS2VASc score of 4 (age, HTN, CAD, DM). SCr 1.59, CrCl still 27m/min. Ok to hold Eliquis 2 days before procedure. 2 day hold after procedure should not be required, however cardiac risk is low enough that this is acceptable if needed.   I will route this recommendation to the requesting party via Epic fax function and remove from pre-op pool.  Please call with questions.  ATami LinDuke, PA 05/23/2018, 1:31 PM

## 2018-05-23 NOTE — Telephone Encounter (Signed)
Request for surgical clearance:     Endoscopy Procedure  What type of surgery is being performed?     EUS  When is this surgery scheduled?     TBD  What type of clearance is required ?   Pharmacy  Are there any medications that need to be held prior to surgery and how long? Eliquis 2 days prior and 2 days after procedure.  Practice name and name of physician performing surgery?      [facility]- Dr Rush Landmark  What is your office phone and fax number?      Phone- [phone]  Fax- [phone] Attn: Helmut Muster   Anesthesia type (None, local, MAC, general) ?       MAC

## 2018-05-23 NOTE — Patient Instructions (Addendum)
If you are age 67 or older, your body mass index should be between 23-30. Your There is no height or weight on file to calculate BMI. If this is out of the aforementioned range listed, please consider follow up with your Primary Care Provider.  If you are age 39 or younger, your body mass index should be between 19-25. Your There is no height or weight on file to calculate BMI. If this is out of the aformentioned range listed, please consider follow up with your Primary Care Provider.   Your provider has requested that you go to the basement level for lab work 3 weeks prior to your procedure. Press "B" on the elevator. The lab is located at the first door on the left as you exit the elevator.    It has been recommended to you by your physician that you have a(n) EUS- cyst aspiration (1.5 hrs) at hospital  completed. We did not schedule the procedure(s) today due to Covid-19. We will contact you once we are able to schedule procedure. Please contact our office at [phone] should you not hear from [place] in 3-4 weeks.  We will contact Dr. Thompson Grayer regarding holding your Eliquis 2 days prior and 2 days after your procedure.    Thank you for choosing me and [facility].  Dr. Rush Landmark

## 2018-05-23 NOTE — Telephone Encounter (Signed)
Pt takes Eliquis for afib with CHADS2VASc score of 4 (age, HTN, CAD, DM). SCr 1.59, CrCl still 28m/min. Ok to hold Eliquis 2 days before procedure. 2 day hold after procedure should not be required, however cardiac risk is low enough that this is acceptable if needed.

## 2018-05-27 DIAGNOSIS — K859 Acute pancreatitis without necrosis or infection, unspecified: Secondary | ICD-10-CM | POA: Insufficient documentation

## 2018-05-27 DIAGNOSIS — R935 Abnormal findings on diagnostic imaging of other abdominal regions, including retroperitoneum: Secondary | ICD-10-CM | POA: Insufficient documentation

## 2018-05-27 HISTORY — DX: Abnormal findings on diagnostic imaging of other abdominal regions, including retroperitoneum: R93.5

## 2018-06-29 ENCOUNTER — Other Ambulatory Visit: Payer: Self-pay

## 2018-06-29 DIAGNOSIS — K862 Cyst of pancreas: Secondary | ICD-10-CM

## 2018-06-29 DIAGNOSIS — K8500 Idiopathic acute pancreatitis without necrosis or infection: Secondary | ICD-10-CM

## 2018-06-30 ENCOUNTER — Telehealth: Payer: Self-pay | Admitting: Gastroenterology

## 2018-06-30 NOTE — Telephone Encounter (Signed)
Returned patients call. After speaking with him, he has decided that he will come to [facility] for Covid testing as he will also need labs prior to his procedure as per Dr.Mansouraty's instructions.

## 2018-07-14 ENCOUNTER — Encounter (HOSPITAL_COMMUNITY): Payer: Self-pay | Admitting: *Deleted

## 2018-07-14 ENCOUNTER — Other Ambulatory Visit (INDEPENDENT_AMBULATORY_CARE_PROVIDER_SITE_OTHER): Payer: Medicare Other

## 2018-07-14 ENCOUNTER — Other Ambulatory Visit (HOSPITAL_COMMUNITY)
Admission: RE | Admit: 2018-07-14 | Discharge: 2018-07-14 | Disposition: A | Discharge status: Home / Self Care | Payer: Medicare Other | Source: Ambulatory Visit | Attending: Gastroenterology | Admitting: Gastroenterology

## 2018-07-14 DIAGNOSIS — K859 Acute pancreatitis without necrosis or infection, unspecified: Secondary | ICD-10-CM | POA: Diagnosis not present

## 2018-07-14 DIAGNOSIS — K862 Cyst of pancreas: Secondary | ICD-10-CM

## 2018-07-14 DIAGNOSIS — Z1159 Encounter for screening for other viral diseases: Secondary | ICD-10-CM | POA: Diagnosis present

## 2018-07-14 LAB — CBC
HCT: 41.6 % (ref 39.0–52.0)
Hemoglobin: 13.8 g/dL (ref 13.0–17.0)
MCHC: 33.2 g/dL (ref 30.0–36.0)
MCV: 87.5 fl (ref 78.0–100.0)
Platelets: 130 10*3/uL — ABNORMAL LOW (ref 150.0–400.0)
RBC: 4.75 Mil/uL (ref 4.22–5.81)
RDW: 15 % (ref 11.5–15.5)
WBC: 6.2 10*3/uL (ref 4.0–10.5)

## 2018-07-14 LAB — COMPREHENSIVE METABOLIC PANEL
ALT: 18 U/L (ref 0–53)
AST: 18 U/L (ref 0–37)
Albumin: 4.2 g/dL (ref 3.5–5.2)
Alkaline Phosphatase: 30 U/L — ABNORMAL LOW (ref 39–117)
BUN: 24 mg/dL — ABNORMAL HIGH (ref 6–23)
CO2: 27 mEq/L (ref 19–32)
Calcium: 8.9 mg/dL (ref 8.4–10.5)
Chloride: 101 mEq/L (ref 96–112)
Creatinine, Ser: 1.78 mg/dL — ABNORMAL HIGH (ref 0.40–1.50)
GFR: 38.32 mL/min — ABNORMAL LOW (ref >60.00)
Glucose, Bld: 126 mg/dL — ABNORMAL HIGH (ref 70–99)
Potassium: 4.5 mEq/L (ref 3.5–5.1)
Sodium: 136 mEq/L (ref 135–145)
Total Bilirubin: 0.6 mg/dL (ref 0.2–1.2)
Total Protein: 6.4 g/dL (ref 6.0–8.3)

## 2018-07-14 LAB — PROTIME-INR
INR: 1.8 ratio — ABNORMAL HIGH (ref 0.8–1.0)
Prothrombin Time: 20.8 s — ABNORMAL HIGH (ref 9.6–13.1)

## 2018-07-14 LAB — SARS CORONAVIRUS 2 (TAT 6-24 HRS): SARS Coronavirus 2: NEGATIVE

## 2018-07-14 NOTE — Progress Notes (Signed)
Unable to reach pt for pre-op call. Left pre-op instructions on pt's voicemail. Pt has a cardiac history, pt's cardiologist is Dr. Rayann Heman. Pt is on Eliquis for A-fib. A note from Dr. Jackalyn Lombard office on 05/23/18 with cardiac clearance noted that pt needed to stop Eliquis 2 days prior to procedure. Pt is a type 2 diabetic. Instructed pt to check his blood sugar when he gets up Monday AM and every 2 hours until he leaves for the hospital. If blood sugar is 70 or below, treat with 1/2 cup of clear juice (apple) and recheck blood sugar 15 minutes after drinking juice. If blood sugar continues to be 70 or below, call the Endoscopy department and ask to speak to a nurse.  Pt had his Covid test done today. I left instructions about quarantining this weekend.    Instructed pt to stop Melatonin and BC powders as of today prior to procedure and to follow instructions that were given for Eliquis.    EKG - 01/09/18 in Epic  Echo - 12/17/16 in [place]  Cath - 2014 in Epic

## 2018-07-17 ENCOUNTER — Ambulatory Visit (HOSPITAL_COMMUNITY)
Admission: RE | Admit: 2018-07-17 | Discharge: 2018-07-17 | Disposition: A | Discharge status: Home / Self Care | Payer: Medicare Other | Attending: Gastroenterology | Admitting: Gastroenterology

## 2018-07-17 ENCOUNTER — Ambulatory Visit (HOSPITAL_COMMUNITY): Payer: Medicare Other | Admitting: Certified Registered Nurse Anesthetist

## 2018-07-17 ENCOUNTER — Encounter (HOSPITAL_COMMUNITY)
Admission: RE | Disposition: A | Discharge status: Home / Self Care | Payer: Self-pay | Source: Home / Self Care | Attending: Gastroenterology

## 2018-07-17 ENCOUNTER — Telehealth: Payer: Self-pay

## 2018-07-17 ENCOUNTER — Other Ambulatory Visit: Payer: Self-pay

## 2018-07-17 ENCOUNTER — Encounter (HOSPITAL_COMMUNITY): Payer: Self-pay | Admitting: Certified Registered Nurse Anesthetist

## 2018-07-17 DIAGNOSIS — I10 Essential (primary) hypertension: Secondary | ICD-10-CM | POA: Diagnosis not present

## 2018-07-17 DIAGNOSIS — E785 Hyperlipidemia, unspecified: Secondary | ICD-10-CM | POA: Insufficient documentation

## 2018-07-17 DIAGNOSIS — M1990 Unspecified osteoarthritis, unspecified site: Secondary | ICD-10-CM | POA: Diagnosis not present

## 2018-07-17 DIAGNOSIS — I480 Paroxysmal atrial fibrillation: Secondary | ICD-10-CM | POA: Diagnosis not present

## 2018-07-17 DIAGNOSIS — G4733 Obstructive sleep apnea (adult) (pediatric): Secondary | ICD-10-CM | POA: Insufficient documentation

## 2018-07-17 DIAGNOSIS — K7460 Unspecified cirrhosis of liver: Secondary | ICD-10-CM

## 2018-07-17 DIAGNOSIS — K766 Portal hypertension: Secondary | ICD-10-CM | POA: Diagnosis not present

## 2018-07-17 DIAGNOSIS — Z87891 Personal history of nicotine dependence: Secondary | ICD-10-CM | POA: Insufficient documentation

## 2018-07-17 DIAGNOSIS — Z7984 Long term (current) use of oral hypoglycemic drugs: Secondary | ICD-10-CM | POA: Insufficient documentation

## 2018-07-17 DIAGNOSIS — I2510 Atherosclerotic heart disease of native coronary artery without angina pectoris: Secondary | ICD-10-CM | POA: Diagnosis not present

## 2018-07-17 DIAGNOSIS — J449 Chronic obstructive pulmonary disease, unspecified: Secondary | ICD-10-CM | POA: Diagnosis not present

## 2018-07-17 DIAGNOSIS — K862 Cyst of pancreas: Secondary | ICD-10-CM

## 2018-07-17 DIAGNOSIS — K859 Acute pancreatitis without necrosis or infection, unspecified: Secondary | ICD-10-CM

## 2018-07-17 DIAGNOSIS — K2970 Gastritis, unspecified, without bleeding: Secondary | ICD-10-CM

## 2018-07-17 DIAGNOSIS — K219 Gastro-esophageal reflux disease without esophagitis: Secondary | ICD-10-CM | POA: Diagnosis not present

## 2018-07-17 DIAGNOSIS — I8500 Esophageal varices without bleeding: Secondary | ICD-10-CM | POA: Insufficient documentation

## 2018-07-17 DIAGNOSIS — Z9119 Patient's noncompliance with other medical treatment and regimen: Secondary | ICD-10-CM | POA: Diagnosis not present

## 2018-07-17 DIAGNOSIS — I8510 Secondary esophageal varices without bleeding: Secondary | ICD-10-CM | POA: Diagnosis not present

## 2018-07-17 DIAGNOSIS — K5710 Diverticulosis of small intestine without perforation or abscess without bleeding: Secondary | ICD-10-CM | POA: Diagnosis not present

## 2018-07-17 DIAGNOSIS — E119 Type 2 diabetes mellitus without complications: Secondary | ICD-10-CM | POA: Diagnosis not present

## 2018-07-17 DIAGNOSIS — K228 Other specified diseases of esophagus: Secondary | ICD-10-CM | POA: Insufficient documentation

## 2018-07-17 DIAGNOSIS — E0590 Thyrotoxicosis, unspecified without thyrotoxic crisis or storm: Secondary | ICD-10-CM | POA: Insufficient documentation

## 2018-07-17 DIAGNOSIS — Z955 Presence of coronary angioplasty implant and graft: Secondary | ICD-10-CM | POA: Diagnosis not present

## 2018-07-17 DIAGNOSIS — K3189 Other diseases of stomach and duodenum: Secondary | ICD-10-CM | POA: Insufficient documentation

## 2018-07-17 DIAGNOSIS — M47816 Spondylosis without myelopathy or radiculopathy, lumbar region: Secondary | ICD-10-CM | POA: Diagnosis not present

## 2018-07-17 DIAGNOSIS — Z7901 Long term (current) use of anticoagulants: Secondary | ICD-10-CM | POA: Insufficient documentation

## 2018-07-17 DIAGNOSIS — Z79899 Other long term (current) drug therapy: Secondary | ICD-10-CM | POA: Insufficient documentation

## 2018-07-17 DIAGNOSIS — K8500 Idiopathic acute pancreatitis without necrosis or infection: Secondary | ICD-10-CM

## 2018-07-17 HISTORY — PX: BIOPSY: SHX5522

## 2018-07-17 HISTORY — DX: Personal history of urinary calculi: Z87.442

## 2018-07-17 HISTORY — PX: ESOPHAGOGASTRODUODENOSCOPY (EGD) WITH PROPOFOL: SHX5813

## 2018-07-17 HISTORY — PX: UPPER ESOPHAGEAL ENDOSCOPIC ULTRASOUND (EUS): SHX6562

## 2018-07-17 LAB — GLUCOSE, CAPILLARY
Glucose-Capillary: 103 mg/dL — ABNORMAL HIGH (ref 70–99)
Glucose-Capillary: 102 mg/dL — ABNORMAL HIGH (ref 70–99)

## 2018-07-17 SURGERY — ESOPHAGOGASTRODUODENOSCOPY (EGD) WITH PROPOFOL
Anesthesia: Monitor Anesthesia Care

## 2018-07-17 MED ORDER — EPHEDRINE SULFATE-NACL 50-0.9 MG/10ML-% IV SOSY
PREFILLED_SYRINGE | INTRAVENOUS | Status: DC | PRN
  Administered 2018-07-17 (×3): 5 mg via INTRAVENOUS
  Administered 2018-07-17 (×2): 15 mg via INTRAVENOUS
  Administered 2018-07-17: 5 mg via INTRAVENOUS

## 2018-07-17 MED ORDER — PANTOPRAZOLE SODIUM 40 MG PO TBEC: 40.0000 mg | DELAYED_RELEASE_TABLET | Freq: Two times a day (BID) | ORAL | 3 refills | Status: DC

## 2018-07-17 MED ORDER — LACTATED RINGERS IV SOLN
INTRAVENOUS | Status: DC
  Administered 2018-07-17 (×2): via INTRAVENOUS

## 2018-07-17 MED ORDER — SUCCINYLCHOLINE CHLORIDE 200 MG/10ML IV SOSY
PREFILLED_SYRINGE | INTRAVENOUS | Status: DC | PRN
  Administered 2018-07-17: 120 mg via INTRAVENOUS

## 2018-07-17 MED ORDER — ONDANSETRON HCL 4 MG/2ML IJ SOLN
INTRAMUSCULAR | Status: DC | PRN
  Administered 2018-07-17: 4 mg via INTRAVENOUS

## 2018-07-17 MED ORDER — PROPOFOL 10 MG/ML IV BOLUS
INTRAVENOUS | Status: DC | PRN
  Administered 2018-07-17: 150 mg via INTRAVENOUS
  Administered 2018-07-17: 30 mg via INTRAVENOUS

## 2018-07-17 MED ORDER — LIDOCAINE 2% (20 MG/ML) 5 ML SYRINGE
INTRAMUSCULAR | Status: DC | PRN
  Administered 2018-07-17: 80 mg via INTRAVENOUS

## 2018-07-17 MED ORDER — PHENYLEPHRINE 40 MCG/ML (10ML) SYRINGE FOR IV PUSH (FOR BLOOD PRESSURE SUPPORT)
PREFILLED_SYRINGE | INTRAVENOUS | Status: DC | PRN
  Administered 2018-07-17: 40 ug via INTRAVENOUS
  Administered 2018-07-17: 120 ug via INTRAVENOUS
  Administered 2018-07-17: 80 ug via INTRAVENOUS
  Administered 2018-07-17: 120 ug via INTRAVENOUS

## 2018-07-17 MED ORDER — ROCURONIUM BROMIDE 100 MG/10ML IV SOLN
INTRAVENOUS | Status: DC | PRN
  Administered 2018-07-17: 20 mg via INTRAVENOUS

## 2018-07-17 MED ORDER — MIDAZOLAM HCL 2 MG/2ML IJ SOLN
INTRAMUSCULAR | Status: DC | PRN
  Administered 2018-07-17: 2 mg via INTRAVENOUS

## 2018-07-17 MED ORDER — SODIUM CHLORIDE 0.9 % IV SOLN: INTRAVENOUS | Status: DC

## 2018-07-17 MED ORDER — SUGAMMADEX SODIUM 200 MG/2ML IV SOLN
INTRAVENOUS | Status: DC | PRN
  Administered 2018-07-17: 200 mg via INTRAVENOUS

## 2018-07-17 MED ORDER — FENTANYL CITRATE (PF) 250 MCG/5ML IJ SOLN
INTRAMUSCULAR | Status: DC | PRN
  Administered 2018-07-17: 50 ug via INTRAVENOUS
  Administered 2018-07-17: 100 ug via INTRAVENOUS

## 2018-07-17 SURGICAL SUPPLY — 15 items

## 2018-07-17 NOTE — Transfer of Care (Signed)
Immediate Anesthesia Transfer of Care Note  Patient: Steve Anthony  Procedure(s) Performed: ESOPHAGOGASTRODUODENOSCOPY (EGD) WITH PROPOFOL (N/A ) UPPER ESOPHAGEAL ENDOSCOPIC ULTRASOUND (EUS) (N/A ) BIOPSY POLYPECTOMY  Patient Location: PACU  Anesthesia Type:General  Level of Consciousness: awake, alert  and patient cooperative  Airway & Oxygen Therapy: Patient Spontanous Breathing and Patient connected to nasal cannula oxygen  Post-op Assessment: Report given to RN and Post -op Vital signs reviewed and stable  Post vital signs: Reviewed and stable  Last Vitals:  Vitals Value Taken Time  BP    Temp    Pulse    Resp    SpO2      Last Pain:  Vitals:   07/17/18 1220  TempSrc: Temporal  PainSc: 0-No pain         Complications: No apparent anesthesia complications

## 2018-07-17 NOTE — H&P (Signed)
GASTROENTEROLOGY OUTPATIENT PROCEDURE H&P NOTE   Primary Care Physician: [name], MD  HPI: Steve Anthony is a 68 y.o. male who presents for EUS with possible cystgastrostomy vs FNA of cystic pancreatic tail lesion.  Past Medical History:  Diagnosis Date  . Arthritis   . CAD (coronary artery disease)    a. S/P PCI mid LAD (vision stent) 08/31/2004;  b. repeat cath 6/11 - no progression of CAD. c. Cath 10/2012 given moderate CAD on cardiac CT - no change from prior cath.  . Chest pain 12/16/2016  . COPD (chronic obstructive pulmonary disease) ([place])   . DJD (degenerative joint disease) of lumbar spine   . Dyslipidemia   . Gallstones   . GERD (gastroesophageal reflux disease)   . History of kidney stones   . Hypertension   . Hyperthyroidism   . Melanoma ([place])    melanoma removed from neck   . On home oxygen therapy    "2L q hs" (11/08/2012)  . OSA (obstructive sleep apnea)    a. cpap noncompliance- patient reports on 12/10/14- does not use CPAP  . Palpitations    a. 10/2012 - s/p LINQ loop recorder to assess for arrhythmia.   . Paroxysmal A-fib ([place])    a. S/P PVI 2/11 and 9/11  . SVT (supraventricular tachycardia) ([place])    a. Atypical AVNRT of the slow pathway  . Type II diabetes mellitus ([place])    Past Surgical History:  Procedure Laterality Date  . ATRIAL ABLATION SURGERY  2007   S/P slow pathway ablation for atypical AVNRT   . CHOLECYSTECTOMY  ~ 2008  . COLON RESECTION  02/2013   . COLONOSCOPY  12/25/2007   Colonic polyp status post polypectomy. Internal hemorrhoids.   . CORONARY ANGIOPLASTY WITH STENT PLACEMENT  08/30/2004   Vision; to LAD   . CYSTOSCOPY W/ STONE MANIPULATION  ~ 1998   "once" (11/08/2012)  . EP Study  2008   Negative EP study in [place]  . FRACTURE SURGERY    . LEFT HEART CATHETERIZATION WITH CORONARY ANGIOGRAM N/A 11/10/2012   Procedure: LEFT HEART CATHETERIZATION WITH CORONARY ANGIOGRAM;  Surgeon: Burnell Blanks, MD;  Location:  [facility] CATH LAB;  Service: Cardiovascular;  Laterality: N/A;  . LOOP RECORDER IMPLANT N/A 11/10/2012   Medtronic LinQ implanted by Dr Rayann Heman for afib management  . [place] SURGERY  2000; 2002  . POSTERIOR LUMBAR FUSION  2004  . PROSTATE SURGERY  ~ 2009  . PVI/ CTI ablation  02/25/2009 and 09/2009   S/P afib/ CTI ablation  . TIBIA FRACTURE SURGERY Right 1990   "broke in 3 places; had rod put in" (11/08/2012)  . TIBIA HARDWARE REMOVAL Right ~ 1991  . TONSILLECTOMY  1965  . TOTAL HIP ARTHROPLASTY Right 12/18/2014   Procedure: RIGHT TOTAL HIP ARTHROPLASTY ANTERIOR APPROACH;  Surgeon: Gaynelle Arabian, MD;  Location: [facility] ORS;  Service: Orthopedics;  Laterality: Right;   Current Facility-Administered Medications  Medication Dose Route Frequency Provider Last Rate Last Dose  . 0.9 %  sodium chloride infusion   Intravenous Continuous Mansouraty, Telford Nab., MD      . lactated ringers infusion   Intravenous Continuous Mansouraty, Telford Nab., MD 10 mL/hr at 07/17/18 1234     Allergies  Allergen Reactions  . Penicillins Hives and Shortness Of Breath    Did it involve swelling of the face/tongue/throat, SOB, or low BP? Yes Did it involve sudden or severe rash/hives, skin peeling, or any reaction on the  inside of your mouth or nose? No Did you need to seek medical attention at a hospital or doctor's office? Yes When did it last happen?40 + years If all above answers are "NO", may proceed with cephalosporin use.     Family History  Problem Relation Age of Onset  . Emphysema Mother   . Heart attack Father        CVA  . Hypertension Father   . Diabetes Father   . Heart disease Father        MI  . Emphysema Maternal Grandfather        Smoker  . Heart disease Paternal Grandfather   . Colon cancer Neg Hx   . Esophageal cancer Neg Hx   . Inflammatory bowel disease Neg Hx   . Liver disease Neg Hx   . Pancreatic cancer Neg Hx   . Rectal cancer Neg Hx   . Stomach cancer Neg Hx    Social  History   Socioeconomic History  . Marital status: Married    Spouse name: Not on file  . Number of children: 1  . Years of education: Not on file  . Highest education level: Not on file  Occupational History  . Occupation: DISABLED    Employer: DISABLED  . 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 Financial resource strain: Not on file  . Food insecurity    Worry: Not on file    Inability: Not on file  . Transportation needs    Medical: Not on file    Non-medical: Not on file  Tobacco Use  . Smoking status: Former Smoker    Packs/day: 3.00    Years: 38.00    Pack years: 114.00    Types: Cigarettes    Quit date: 03/25/2004    Years since quitting: 14.3  . Smokeless tobacco: Former Systems developer    Types: [place] date: 04/25/2004  Substance and Sexual Activity  . Alcohol use: Yes    Comment: 11/08/2012 "quit drinking in 1989"  . Drug use: No  . Sexual activity: Not Currently  Lifestyle  . Physical activity    Days per week: Not on file    Minutes per session: Not on file  . Stress: Not on file  Relationships  . Social Herbalist on phone: Not on file    Gets together: Not on file    Attends religious service: Not on file    Active member of club or organization: Not on file    Attends meetings of clubs or organizations: Not on file    Relationship status: Not on file  . Intimate partner violence    Fear of current or ex partner: Not on file    Emotionally abused: Not on file    Physically abused: Not on file    Forced sexual activity: Not on file  Other Topics Concern  . Not on file  Social History Narrative   Resides in [place] with his wife   Two children and two grandchildren   Attends [employer]    Physical Exam: Vital signs in last 24 hours: Temp:  [99.7 F (37.6 C)] 99.7 F (37.6 C) (06/22 1220) Pulse Rate:  [62] 62 (06/22 1220) Resp:  [12] 12 (06/22 1220) BP: (126)/(81) 126/81 (06/22 1220) SpO2:  [96 %] 96 %  (06/22 1220)   GEN: NAD EYE: Sclerae anicteric ENT: MMM CV: Without R/Gs  RESP: CTAB posteriorly GI:  Soft, NT/ND NEURO:  Alert & Oriented x 3  Lab Results: No results for input(s): WBC, HGB, HCT, PLT in the last 72 hours. BMET No results for input(s): NA, K, CL, CO2, GLUCOSE, BUN, CREATININE, CALCIUM in the last 72 hours. LFT No results for input(s): PROT, ALBUMIN, AST, ALT, ALKPHOS, BILITOT, BILIDIR, IBILI in the last 72 hours. PT/INR No results for input(s): LABPROT, INR in the last 72 hours.   Impression / Plan: This is a 68 y.o.male who presents for EGD/EUS with possible cystgastrostomy placement vs FNA of pancreatic tail cyst.  The risks of EUS including bleeding, infection, aspiration pneumonia and intestinal perforation were discussed as was the possibility it may not give a definitive diagnosis.  If a biopsy of the pancreas is done as part of the EUS, there is an additional risk of pancreatitis at the rate of about 1%.  It was explained that procedure related pancreatitis is typically mild, although can be severe and even life threatening, which is why we do not perform random pancreatic biopsies and only biopsy a lesion we feel is concerning enough to warrant the risk.  The risks and benefits of endoscopic evaluation were discussed with the patient; these include but are not limited to the risk of perforation, infection, bleeding, missed lesions, lack of diagnosis, severe illness requiring hospitalization, as well as anesthesia and sedation related illnesses.  The patient is agreeable to proceed.    Justice Britain, MD [facility] Office # [phone]

## 2018-07-17 NOTE — Discharge Instructions (Signed)

## 2018-07-17 NOTE — Op Note (Addendum)
[facility] Patient Name: Steve Anthony Procedure Date : 07/17/2018 MRN: [id number] Attending MD: Justice Britain , MD Date of Birth: May 17, 1950 CSN: [id number] Age: 68 Admit Type: Outpatient Procedure:                Upper EUS Indications:              Pancreatic cyst on CT scan, Pancreatic cyst on                            MRCP, Acute pancreatitis Providers:                Justice Britain, MD, Raynelle Bring, RN, Marguerita Merles, Technician Referring MD:             Jackquline Denmark, MD Medicines:                General Anesthesia Complications:            No immediate complications. Estimated Blood Loss:     Estimated blood loss was minimal. Procedure:                Pre-Anesthesia Assessment:                           - Prior to the procedure, a History and Physical                            was performed, and patient medications and                            allergies were reviewed. The patient's tolerance of                            previous anesthesia was also reviewed. The risks                            and benefits of the procedure and the sedation                            options and risks were discussed with the patient.                            All questions were answered, and informed consent                            was obtained. Prior Anticoagulants: The patient has                            taken Eliquis (apixaban), last dose was 3 days                            prior to procedure. ASA Grade Assessment: III - A  patient with severe systemic disease. After                            reviewing the risks and benefits, the patient was                            deemed in satisfactory condition to undergo the                            procedure.                           The GIF-H190 ([id number]) Olympus gastroscope was                            introduced through the mouth, and advanced to the                             second part of duodenum. After obtaining informed                            consent, the endoscope was passed under direct                            vision. Throughout the procedure, the patient's                            blood pressure, pulse, and oxygen saturations were                            monitored continuously. The [id number]-Q180V ([id number])                            Olympus duodenoscope was introduced through the                            mouth, and advanced to the second part of duodenum.                            The GF-UTC180 ([id number]) Olympus Linear EUS scope                            was introduced through the mouth, and advanced to                            the duodenum for ultrasound examination from the                            stomach and duodenum. The upper EUS was                            accomplished without difficulty. The patient  tolerated the procedure. Scope In: Scope Out: Findings:      ENDOSCOPIC FINDING: :      No gross lesions were noted in the proximal esophagus.      A single small bleb was found in the mid esophagus.      Multiple columns of grade I, small (< 5 mm) varices were found in the       distal esophagus.      The Z-line was regular and was found 41 cm from the incisors.      Mild portal hypertensive gastropathy was found in the cardia, in the       gastric fundus and in the gastric body.      Patchy moderate inflammation characterized by erythema and friability       was found in the gastric antrum. Biopsies were taken with a cold forceps       for histology and Helicobacter pylori testing.      No gross mucosal lesions were noted in the duodenal bulb, in the first       portion of the duodenum and in the second portion of the duodenum.      A medium diverticulum was found at the area of the major papilla - the       major papilla is on the rim of this pantaloon diverticulum but is  not       within it.      A medium diverticulum was found at the area of the minor papilla - the       minor papilla was not visualized completely, I am concerned that it may       be within the diverticulum (able to see a small nodularity in where I       would expect it to be but could not see the orifice).      ENDOSONOGRAPHIC FINDING: :      An anechoic lesion suggestive of a cyst was identified in the pancreatic       tail. It does not communicate with the pancreatic duct. The lesion       measured 65 mm by 54 mm in maximal cross-sectional diameter. There was a       single large compartment thinly septated. The outer wall of the lesion       was thick. There was no associated mass. There was internal debris       within the fluid-filled cavity. Blood vessels surround this region       nearly entirely in the superior portion of this region (as noted in the       patient's cross-sectional imaging studies. There may be a very small       [place] for possible aspiration. However, today, I held off on FNA of       cystic lesion.      Pancreatic parenchymal abnormalities were noted in the entire pancreas.       These consisted of atrophy and diffuse heterogeneity. No evidence of       chronic pancreatitis based on EUS criteria.      The pancreatic duct had a normal endosonographic appearance in the       pancreatic head (1.3 mm -> 1.4 mm), genu of the pancreas (1.6 mm), body       of the pancreas (2.4 mm) and tail of the pancreas (1.4 mm).      Endosonographic imaging of the ampulla showed no mucosal or intramural       (  subepithelial) lesion.      There was dilation in the common bile duct (6.4 mm -> 8.2 mm)      Endosonographic imaging in the visualized portion of the liver showed no       mass-lesion.      No malignant-appearing lymph nodes were visualized in the celiac region       (level 20), perigastric region and peripancreatic region.      The celiac region was  visualized. Impression:               EGD Impression:                           - No gross lesions in upper esophagus.                           - Bleb found in the mid-esophagus.                           - Grade I and small (< 5 mm) esophageal varices                            distally.                           - Z-line regular, 41 cm from the incisors.                           - Portal hypertensive gastropathy in proximal                            stomach.                           - Gastritis. Biopsied for HP.                           - No gross lesions in the duodenal bulb, in the                            first portion of the duodenum and in the second                            portion of the duodenum.                           - Duodenal diverticulum at region of Major ampulla                            - Ampulla is not within diverticulum.                           - Duodenal diverticulum at region of Minor ampulla                            - Unable to be visualized completely.  EUS Impression:                           - A cystic lesion was seen in the pancreatic tail.                            Tissue has not been obtained. However, the                            endosonographic appearance is consistent with a                            previous pseudocyst with possible debris within                            this. This is not characteristic of an IPMN. There                            are blood vessels that surround the near entirety                            of the superior aspect of the cyst wall, making FNA                            very difficult. I think there may be a small                            window, however, the patient has done extremely                            well in the last nearly 64-month without any                            recurrence of issues, that at this moment he would                            potentially be  at a greater risk of possible                            complication to go after. I held off on sampling                            today.                           - Pancreatic parenchymal abnormalities consisting                            of atrophy and diffuse heterogeneity were noted in                            the entire pancreas - not characteristic of chronic  pancreatitis.                           - The pancreatic duct had a normal endosonographic                            appearance in the pancreatic head, genu of the                            pancreas, body of the pancreas and tail of the                            pancreas.                           - There was dilation in the common bile duct.                           - No malignant-appearing lymph nodes were                            visualized in the celiac region (level 20),                            perigastric region and peripancreatic region. Recommendation:           - The patient will be observed post-procedure,                            until all discharge criteria are met.                           - Discharge patient to home.                           - Patient has a contact number available for                            emergencies. The signs and symptoms of potential                            delayed complications were discussed with the                            patient. Return to normal activities tomorrow.                            Written discharge instructions were provided to the                            patient.                           - Observe patient's clinical course.                           -  Restart Apixiban in 48 hours.                           - Observe patient's clinical course.                           - Await path results.                           - Patient's pancreatitis remains Idiopathic based                            on current data.                            - INcrease PPI to BID for next 8-weeks with plan to                            decrease back to QD if he does well and no evidence                            of HP on biopsies.                           - Would repeat imaging in 3-6 months with                            CT-Abdomen with IV Contrast to re-evaluate the                            region.                           - If further episodes of pancreatitis occur, will                            have stronger indication to attempt sampling. The                            region is too far with current AXIOS stent                            technology for drainage and with the very small                            window with large blood vessels in the region, I do                            not think that it is worth the risk of attempting,                            while patient is doing very well for almost  74-month at this point in time.                           - Daughter and Wife were made up to date with the                            findings concerning for underlying liver disease.                            [facility]                            [facility] for follow up with Dr. GLyndel Safein the next                            week or so to further evaluate the possible                            etiology of his liver disease.                           - The findings and recommendations were discussed                            with the patient.                           - The findings and recommendations were discussed                            with the patient's family. Procedure Code(s):        --- Professional ---                           [phone] Esophagogastroduodenoscopy, flexible,                            transoral; with endoscopic ultrasound examination                            limited to the esophagus, stomach or duodenum, and                             adjacent structures                           43239, Esophagogastroduodenoscopy, flexible,                            transoral; with biopsy, single or multiple Diagnosis Code(s):        --- Professional ---                           K22.8, Other specified diseases of esophagus  I85.00, Esophageal varices without bleeding                           K76.6, Portal hypertension                           K31.89, Other diseases of stomach and duodenum                           K29.70, Gastritis, unspecified, without bleeding                           K86.2, Cyst of pancreas                           K86.9, Disease of pancreas, unspecified                           I89.9, Noninfective disorder of lymphatic vessels                            and lymph nodes, unspecified                           K85.90, Acute pancreatitis without necrosis or                            infection, unspecified                           K57.10, Diverticulosis of small intestine without                            perforation or abscess without bleeding                           K83.8, Other specified diseases of biliary tract CPT copyright 2019 American Medical Association. All rights reserved. The codes documented in this report are preliminary and upon coder review may  be revised to meet current compliance requirements. Justice Britain, MD 07/17/2018 3:21:23 PM Number of Addenda: 0

## 2018-07-17 NOTE — Anesthesia Procedure Notes (Signed)
Procedure Name: Intubation Date/Time: 07/17/2018 1:22 PM Performed by: Elayne Snare, CRNA Pre-anesthesia Checklist: Patient identified, Emergency Drugs available, Suction available and Patient being monitored Patient Re-evaluated:Patient Re-evaluated prior to induction Oxygen Delivery Method: Circle System Utilized Preoxygenation: Pre-oxygenation with 100% oxygen Induction Type: IV induction and Rapid sequence Laryngoscope Size: Mac and 4 Grade View: Grade I Tube type: Oral Tube size: 7.5 mm Number of attempts: 1 Airway Equipment and Method: Stylet Placement Confirmation: ETT inserted through vocal cords under direct vision,  positive ETCO2 and breath sounds checked- equal and bilateral Secured at: 22 cm Tube secured with: Tape Dental Injury: Teeth and Oropharynx as per pre-operative assessment

## 2018-07-17 NOTE — Anesthesia Preprocedure Evaluation (Signed)
Anesthesia Evaluation  Patient identified by MRN, date of birth, ID band Patient awake    Reviewed: Allergy & Precautions, NPO status , Patient's Chart, lab work & pertinent test results  History of Anesthesia Complications Negative for: history of anesthetic complications  Airway Mallampati: I  TM Distance: >3 FB Neck ROM: Full    Dental no notable dental hx. (+) Dental Advisory Given, Edentulous Upper, Edentulous Lower   Pulmonary sleep apnea , COPD, former smoker,    Pulmonary exam normal        Cardiovascular hypertension, Pt. on home beta blockers and Pt. on medications + angina + CAD  Normal cardiovascular exam     Neuro/Psych negative neurological ROS  negative psych ROS   GI/Hepatic Neg liver ROS, GERD  ,  Endo/Other  negative endocrine ROSdiabetes  Renal/GU negative Renal ROS  negative genitourinary   Musculoskeletal negative musculoskeletal ROS (+)   Abdominal   Peds negative pediatric ROS (+)  Hematology negative hematology ROS (+)   Anesthesia Other Findings   Reproductive/Obstetrics negative OB ROS                             Anesthesia Physical Anesthesia Plan  ASA: III  Anesthesia Plan: MAC   Post-op Pain Management:    Induction:   PONV Risk Score and Plan: Ondansetron and Dexamethasone  Airway Management Planned: Natural Airway  Additional Equipment:   Intra-op Plan:   Post-operative Plan:   Informed Consent: I have reviewed the patients History and Physical, chart, labs and discussed the procedure including the risks, benefits and alternatives for the proposed anesthesia with the patient or authorized representative who has indicated his/her understanding and acceptance.     Dental advisory given  Plan Discussed with: Anesthesiologist  Anesthesia Plan Comments:         Anesthesia Quick Evaluation

## 2018-07-17 NOTE — Anesthesia Postprocedure Evaluation (Signed)
Anesthesia Post Note  Patient: Steve Anthony  Procedure(s) Performed: ESOPHAGOGASTRODUODENOSCOPY (EGD) WITH PROPOFOL (N/A ) UPPER ESOPHAGEAL ENDOSCOPIC ULTRASOUND (EUS) (N/A ) BIOPSY POLYPECTOMY     Patient location during evaluation: PACU Anesthesia Type: MAC Level of consciousness: awake and alert Pain management: pain level controlled Vital Signs Assessment: post-procedure vital signs reviewed and stable Respiratory status: spontaneous breathing and respiratory function stable Cardiovascular status: stable Postop Assessment: no apparent nausea or vomiting Anesthetic complications: no    Last Vitals:  Vitals:   07/17/18 1456 07/17/18 1530  BP: 99/64 116/70  Pulse: 66 67  Resp: 17 18  Temp:  36.8 C  SpO2:  94%    Last Pain:  Vitals:   07/17/18 1530  TempSrc:   PainSc: 0-No pain                 Emberli Ballester DANIEL

## 2018-07-17 NOTE — Telephone Encounter (Signed)
-----   Message from Irving Copas., MD sent at 07/17/2018  4:26 PM EDT ----- Merrie Roof, Patient has done extremely well over the course of the last few months. I will let you know what the results of my biopsy show. There is no doubt that he does have evidence of liver scarring and cirrhosis. After long conversation with the patient and afterwards with the patient's daughter and wife we decided to hold off on current endoscopic cyst aspiration.  That blood vessel that runs superiorly is so close that I think there is only a very small window.  Based on findings of debris within the cyst this makes IPMN much less likely and more likely that this is a walled necrosis versus previous pseudocyst.  We could be wrong and may have to consider repeat EUS attempt and sampling but I think for now we will thought it would be worthwhile to monitor. I did tell him that we would want to get some laboratories performed. Manasseh Pittsley can you put the following labs and the patient will end up heading to the [facility] office to get his labs done in the next week or so: Hepatitis A total, hepatitis B surface antigen, hepatitis B surface antibody, hepatitis B core antibody total, hepatitis C antibody with reflex RNA, ANA, AMA, anti-smooth muscle antibody, ferritin, ceruloplasmin. Thank you. GM

## 2018-07-18 ENCOUNTER — Other Ambulatory Visit (INDEPENDENT_AMBULATORY_CARE_PROVIDER_SITE_OTHER): Payer: Medicare Other

## 2018-07-18 ENCOUNTER — Telehealth: Payer: Self-pay | Admitting: Gastroenterology

## 2018-07-18 DIAGNOSIS — K7460 Unspecified cirrhosis of liver: Secondary | ICD-10-CM

## 2018-07-18 LAB — FERRITIN: Ferritin: 119.5 ng/mL (ref 22.0–322.0)

## 2018-07-18 NOTE — Telephone Encounter (Signed)
Patient was asked to have lab work done and was given the option to do it at [facility]. He called to say that he would like to have his labs done in [place] but would like to know when he needs to go.

## 2018-07-20 ENCOUNTER — Encounter: Payer: Self-pay | Admitting: Gastroenterology

## 2018-07-20 ENCOUNTER — Encounter (HOSPITAL_COMMUNITY): Payer: Self-pay | Admitting: Gastroenterology

## 2018-07-21 LAB — HEPATITIS A ANTIBODY, TOTAL: Hepatitis A AB,Total: REACTIVE — AB

## 2018-07-21 LAB — MITOCHONDRIAL ANTIBODIES: Mitochondrial M2 Ab, IgG: <=20 U

## 2018-07-21 LAB — HEPATITIS B CORE ANTIBODY, TOTAL: Hep B Core Total Ab: NONREACTIVE

## 2018-07-21 LAB — HEPATITIS C ANTIBODY
Hepatitis C Ab: NONREACTIVE
SIGNAL TO CUT-OFF: 0.02 (ref <1.00)

## 2018-07-21 LAB — ANA: Anti Nuclear Antibody (ANA): NEGATIVE

## 2018-07-21 LAB — HEPATITIS B SURFACE ANTIGEN: Hepatitis B Surface Ag: NONREACTIVE

## 2018-07-21 LAB — HEPATITIS C RNA QUANTITATIVE
HCV Quantitative Log: <1.18 Log IU/mL
HCV RNA, PCR, QN: <15 IU/mL

## 2018-07-21 LAB — HEPATITIS B SURFACE ANTIBODY,QUALITATIVE: Hep B S Ab: NONREACTIVE

## 2018-07-21 LAB — ANTI-SMOOTH MUSCLE ANTIBODY, IGG: Actin (Smooth Muscle) Antibody (IGG): <20 U (ref <20)

## 2018-07-21 LAB — CERULOPLASMIN: Ceruloplasmin: 24 mg/dL (ref 18–36)

## 2018-08-09 ENCOUNTER — Encounter: Payer: Self-pay | Admitting: Gastroenterology

## 2018-08-09 ENCOUNTER — Telehealth (INDEPENDENT_AMBULATORY_CARE_PROVIDER_SITE_OTHER): Payer: Medicare Other | Admitting: Gastroenterology

## 2018-08-09 ENCOUNTER — Other Ambulatory Visit: Payer: Self-pay

## 2018-08-09 VITALS — Ht 71.5 in | Wt 241.0 lb

## 2018-08-09 DIAGNOSIS — K7460 Unspecified cirrhosis of liver: Secondary | ICD-10-CM

## 2018-08-09 DIAGNOSIS — K8500 Idiopathic acute pancreatitis without necrosis or infection: Secondary | ICD-10-CM

## 2018-08-09 DIAGNOSIS — K862 Cyst of pancreas: Secondary | ICD-10-CM

## 2018-08-09 NOTE — Patient Instructions (Signed)
If you are age 67 or older, your body mass index should be between 23-30. Your Body mass index is 33.14 kg/m. If this is out of the aforementioned range listed, please consider follow up with your Primary Care Provider.  If you are age 23 or younger, your body mass index should be between 19-25. Your Body mass index is 33.14 kg/m. If this is out of the aformentioned range listed, please consider follow up with your Primary Care Provider.   CT pancreatic protocol in October 2020 as recommended by Dr. Rush Landmark.  -Hep B vaccine (Dr. Willette Pa office or [facility] health.  Note April works in [place])  -Low salt diet.  Avoid fatty fried foods  -Continue taking Protonix 40 mg p.o. twice daily for now (he does not want to reduce it since he is doing so well), Spironolactone.  -Recommend best possible control for diabetes.  Thank you,  Dr. Jackquline Denmark

## 2018-08-09 NOTE — Progress Notes (Signed)
Chief Complaint: FU pancreatic pseudocyst  Referring Provider:  [name], MD      ASSESSMENT AND PLAN;   #1. H/O acute idiopathic recurrent pancreatitis with pancreatic tail pseudocyst. EUS (Dr Rush Landmark) 06/2018 6.5 cm x 5.4 cm pseudocyst with surrounding blood vessels and too far for AXIOS stent insertion.  Asymptomatic and has decreased in size.  Currently being managed conservatively.  (Adm RH 02/15/2018, June, July, dec 2019, 08/2010) with  pancreatic tail pseudocyst 7 x 9 cm (on CT urogram 09/22/2017). H/O remote ETOH abuse, none since 1989. S/P chole in past. H/O hypertriglyceridemia. Nl LFTs 08/2017, most recent TG 278 (09/30/2017), Nl lipase.  No clinical exocrine insufficiency or pancreatic calcification on noncontrast CT. No chronic pancreatitis on EUS.  #2. Cryptogenic liver cirrhosis (likely d/t NASH, Dx 02/2018 on CT) with portal hypertension- moderate splenomegaly (on CT), mild thrombocytopenia, portal venous collaterals. Gd 1 eso varices on EGD (06/2018). Neg WU for etiology 2020. Immune to A but not to B. No ascites or HE. Nl AFP.  #3. Multiple comorbid conditions including A. fib on Eliquis, COPD on O2, OSA, CKD3 (Cr 1.59 09/2017), CAD, dCHF, DM2, obesity, remote PE.  Plan: -CT pancreatic protocol in October 2020 as recommended by Dr. Rush Landmark. -Hep B vaccine (Dr. Willette Pa office or [place].  Note April works in [place]) -FU after CT. -Low salt diet.  Avoid fatty fried foods. -Continue taking Protonix 40 mg p.o. twice daily for now (he does not want to reduce it since he is doing so well), Spironolactone. -Recommend best possible control for diabetes.   HPI:    Steve Anthony is a 68 y.o. male  For follow-up visit. Doing very well.  No abdominal pain.  No nausea, vomiting, heartburn, regurgitation, odynophagia or dysphagia.  No significant diarrhea or constipation.  There is no melena or hematochezia. No unintentional  weight loss.  Good appetite.  S/P EGD/EUS as below.   Previous imaging studies : - NCCT scan 09/22/2017 -showed large 7 into 9 cm pancreatic pseudocyst in the tail of the pancreas. -CT Abdo/pelvis with IV contrast 02/15/2018: Acute pancreatitis, 8.3 cm pseudocyst of the pancreatic tail.  Unchanged splenomegaly. - CT Abdo/pelvis with IV contrast  05/10/2018:  1. Resolution of peripancreatic inflammatory changes adjacent to pancreatic tail since previous study. 2. Slight increase in size of 7.3 cm rim enhancing fluid collection involving the pancreatic tail, consistent with pancreatic pseudocyst. 3. Hepatic cirrhosis and mild steatosis. No evidence of hepatic neoplasm. 4. Stable splenomegaly. -MRCP 02/2018 ? Hemo psudocyst -EGD/EUS 07/17/2018: Grade 1 esophageal varices, portal hypertensive gastropathy, cystic 6.5 induced 5.4 cm pancreatic tail pseudocyst with surrounding blood vessels.   SH- daughter is a Therapist, sports, [employer] [place]  (April phone [phone]) Past Medical History:  Diagnosis Date   Arthritis    CAD (coronary artery disease)    a. S/P PCI mid LAD (vision stent) 08/31/2004;  b. repeat cath 6/11 - no progression of CAD. c. Cath 10/2012 given moderate CAD on cardiac CT - no change from prior cath.   Chest pain 12/16/2016   COPD (chronic obstructive pulmonary disease) (HCC)    DJD (degenerative joint disease) of lumbar spine    Dyslipidemia    Gallstones    GERD (gastroesophageal reflux disease)    History of kidney stones    Hypertension    Hyperthyroidism    Melanoma ([place])    melanoma removed from neck    On home oxygen therapy    "  2L q hs" (11/08/2012)   OSA (obstructive sleep apnea)    a. cpap noncompliance- patient reports on 12/10/14- does not use CPAP   Palpitations    a. 10/2012 - s/p LINQ loop recorder to assess for arrhythmia.    Paroxysmal A-fib ([place])    a. S/P PVI 2/11 and 9/11   SVT (supraventricular tachycardia) ([place])    a.  Atypical AVNRT of the slow pathway   Type II diabetes mellitus [facility])     Past Surgical History:  Procedure Laterality Date   ATRIAL ABLATION SURGERY  2007   S/P slow pathway ablation for atypical AVNRT    BIOPSY  07/17/2018   Procedure: BIOPSY;  Surgeon: Irving Copas., MD;  Location: [facility] ENDOSCOPY;  Service: Gastroenterology;;   CHOLECYSTECTOMY  ~ 2008   COLON RESECTION  02/2013    COLONOSCOPY  12/25/2007   Colonic polyp status post polypectomy. Internal hemorrhoids.    CORONARY ANGIOPLASTY WITH STENT PLACEMENT  08/30/2004   Vision; to LAD    CYSTOSCOPY W/ STONE MANIPULATION  ~ 1998   "once" (11/08/2012)   EP Study  2008   Negative EP study in [place]   ESOPHAGOGASTRODUODENOSCOPY (EGD) WITH PROPOFOL N/A 07/17/2018   Procedure: ESOPHAGOGASTRODUODENOSCOPY (EGD) WITH PROPOFOL;  Surgeon: Irving Copas., MD;  Location: [facility];  Service: Gastroenterology;  Laterality: N/A;   FRACTURE SURGERY     LEFT HEART CATHETERIZATION WITH CORONARY ANGIOGRAM N/A 11/10/2012   Procedure: LEFT HEART CATHETERIZATION WITH CORONARY ANGIOGRAM;  Surgeon: Burnell Blanks, MD;  Location: [facility] CATH LAB;  Service: Cardiovascular;  Laterality: N/A;   LOOP RECORDER IMPLANT N/A 11/10/2012   Medtronic LinQ implanted by Dr Rayann Heman for afib management   LUMBAR [employer]  2000; 2002   POSTERIOR LUMBAR FUSION  2004   PROSTATE SURGERY  ~ 2009   PVI/ CTI ablation  02/25/2009 and 09/2009   S/P afib/ CTI ablation   TIBIA FRACTURE SURGERY Right 1990   "broke in 3 places; had rod put in" (11/08/2012)   TIBIA HARDWARE REMOVAL Right ~ [place] Right 12/18/2014   Procedure: RIGHT TOTAL HIP ARTHROPLASTY ANTERIOR APPROACH;  Surgeon: Gaynelle Arabian, MD;  Location: [facility] ORS;  Service: Orthopedics;  Laterality: Right;   UPPER ESOPHAGEAL ENDOSCOPIC ULTRASOUND (EUS) N/A 07/17/2018   Procedure: UPPER ESOPHAGEAL ENDOSCOPIC ULTRASOUND (EUS);   Surgeon: Irving Copas., MD;  Location: [facility];  Service: Gastroenterology;  Laterality: N/A;    Family History  Problem Relation Age of Onset   Emphysema Mother    Heart attack Father        CVA   Hypertension Father    Diabetes Father    Heart disease Father        MI   Emphysema Maternal Grandfather        Smoker   Heart disease Paternal Grandfather    Colon cancer Neg Hx    Esophageal cancer Neg Hx    Inflammatory bowel disease Neg Hx    Liver disease Neg Hx    Pancreatic cancer Neg Hx    Rectal cancer Neg Hx    Stomach cancer Neg Hx     Social History   Tobacco Use   Smoking status: Former Smoker    Packs/day: 3.00    Years: 38.00    Pack years: 114.00    Types: Cigarettes    Quit date: 03/25/2004    Years since quitting: 14.3   Smokeless tobacco: Former Systems developer  Types: Sarina Ser    Quit date: 04/25/2004  Substance Use Topics   Alcohol use: Yes    Comment: 11/08/2012 "quit drinking in 1989"   Drug use: No    Current Outpatient Medications  Medication Sig Dispense Refill   acetaminophen (TYLENOL) 650 MG CR tablet Take 1,300 mg by mouth every 8 (eight) hours as needed for pain.     apixaban (ELIQUIS) 5 MG TABS tablet Take 1 tablet (5 mg total) by mouth 2 (two) times daily. 180 tablet 3   Aspirin-Salicylamide-Caffeine (BC HEADACHE PO) Take 1 packet by mouth daily as needed (headaches).     atorvastatin (LIPITOR) 40 MG tablet Take 40 mg by mouth daily.      Cyanocobalamin (VITAMIN B-12 IJ) Inject 1,000 mcg as directed every 30 (thirty) days.     Docusate Sodium (COLACE PO) Take 1 tablet by mouth daily.     escitalopram (LEXAPRO) 10 MG tablet Take 10 mg by mouth daily.  1   fenofibrate 160 MG tablet Take 160 mg by mouth daily.       ferrous sulfate 325 (65 FE) MG tablet Take 325 mg by mouth daily with breakfast.     furosemide (LASIX) 20 MG tablet Take 40 mg by mouth daily.      gabapentin (NEURONTIN) 300 MG capsule Take 300 mg  by mouth 3 (three) times daily.     Melatonin 10 MG TABS Take 10 mg by mouth at bedtime.      metFORMIN (GLUCOPHAGE) 500 MG tablet Take 500 mg by mouth 2 (two) times daily.     metoprolol succinate (TOPROL-XL) 50 MG 24 hr tablet TAKE 1 TABLET BY MOUTH TWICE A DAY (Patient taking differently: Take 50 mg by mouth daily. ) 60 tablet 3   naloxone (NARCAN) 4 MG/0.1ML LIQD nasal spray kit Place 1 spray into the nose daily as needed (opioid overdose).     nitroGLYCERIN (NITROSTAT) 0.4 MG SL tablet Place 1 tablet (0.4 mg total) under the tongue every 5 (five) minutes x 3 doses as needed for chest pain. 25 tablet 1   OVER THE COUNTER MEDICATION Apply 1 application topically daily as needed (rash). OTC fanny cream     oxyCODONE-acetaminophen (PERCOCET) 10-325 MG tablet Take 1 tablet by mouth every 4 (four) hours as needed for pain.      OXYGEN Inhale 2 L into the lungs as needed.     pantoprazole (PROTONIX) 40 MG tablet Take 1 tablet (40 mg total) by mouth 2 (two) times daily. 60 tablet 3   spironolactone (ALDACTONE) 25 MG tablet Take 25 mg by mouth daily.  1   Tamsulosin HCl (FLOMAX) 0.4 MG CAPS Take 0.4 mg by mouth daily.       tiZANidine (ZANAFLEX) 4 MG tablet Take 4 mg by mouth at bedtime.     traZODone (DESYREL) 50 MG tablet Take 100 mg by mouth at bedtime.      No current facility-administered medications for this visit.     Allergies  Allergen Reactions   Penicillins Hives and Shortness Of Breath    Did it involve swelling of the face/tongue/throat, SOB, or low BP? Yes Did it involve sudden or severe rash/hives, skin peeling, or any reaction on the inside of your mouth or nose? No Did you need to seek medical attention at a hospital or doctor's office? Yes When did it last happen?40 + years If all above answers are NO, may proceed with cephalosporin use.      Review of  Systems:  neg     Physical Exam:    Ht 5' 11.5" (1.816 m)    Wt 241 lb (109.3 kg)    BMI  33.14 kg/m  Filed Weights   08/09/18 0837  Weight: 241 lb (109.3 kg)   Constitutional:  Well-developed, in no acute distress. Psychiatric: Normal mood and affect. Behavior is normal. Doxy video visit  Data Reviewed: I have personally reviewed following labs and imaging studies  CBC Latest Ref Rng & Units 07/14/2018 03/16/2018 12/17/2016  WBC 4.0 - 10.5 K/uL 6.2 5.9 7.9  Hemoglobin 13.0 - 17.0 g/dL 13.8 13.7 14.0  Hematocrit 39.0 - 52.0 % 41.6 42.1 41.5  Platelets 150.0 - 400.0 K/uL 130.0(L) 140.0(L) 99(L)     CBC: CBC Latest Ref Rng & Units 07/14/2018 03/16/2018 12/17/2016  WBC 4.0 - 10.5 K/uL 6.2 5.9 7.9  Hemoglobin 13.0 - 17.0 g/dL 13.8 13.7 14.0  Hematocrit 39.0 - 52.0 % 41.6 42.1 41.5  Platelets 150.0 - 400.0 K/uL 130.0(L) 140.0(L) 99(L)    CMP: CMP Latest Ref Rng & Units 07/14/2018 05/10/2018 03/16/2018  Glucose 70 - 99 mg/dL 126(H) - 91  BUN 6 - 23 mg/dL 24(H) 19 19  Creatinine 0.40 - 1.50 mg/dL 1.78(H) 1.73(H) 1.59(H)  Sodium 135 - 145 mEq/L 136 - 138  Potassium 3.5 - 5.1 mEq/L 4.5 - 4.5  Chloride 96 - 112 mEq/L 101 - 101  CO2 19 - 32 mEq/L 27 - 30  Calcium 8.4 - 10.5 mg/dL 8.9 - 7.9(L)  Total Protein 6.0 - 8.3 g/dL 6.4 - 6.6  Total Bilirubin 0.2 - 1.2 mg/dL 0.6 - 0.6  Alkaline Phos 39 - 117 U/L 30(L) - 37(L)  AST 0 - 37 U/L 18 - 16  ALT 0 - 53 U/L 18 - 11    This service was provided via Doxy video telemedicine.  The patient was located at home.  The provider was located in office.  The patient did consent to this telephone visit and is aware of possible charges through their insurance for this visit.   Time spent on call/coordination of care: 25 min  Carmell Austria, MD 08/09/2018, 9:08 AM  Cc: [name], MD

## 2018-09-22 ENCOUNTER — Telehealth: Payer: Self-pay | Admitting: Gastroenterology

## 2018-09-22 DIAGNOSIS — K7460 Unspecified cirrhosis of liver: Secondary | ICD-10-CM

## 2018-09-22 DIAGNOSIS — K859 Acute pancreatitis without necrosis or infection, unspecified: Secondary | ICD-10-CM

## 2018-09-22 NOTE — Telephone Encounter (Signed)
Lets do -Clear liquid diet x 24 to 48 hours. -Please give Ultracet 1 tablet p.o. every 8 hours as needed (30).  May have to print a prescription. -Phenergan 25 mg every 8 hours as needed for nausea -If pain worsens, need to go to the ED -Otherwise we should make follow-up appointment next week with me.  Thx  RG

## 2018-09-22 NOTE — Telephone Encounter (Signed)
Called and spoke with patient-patient thinks he is feeling a little nauseated and having "some stomach pain"-wants to know if there is anything he can do be informed to do since he is having gas and indigestion along with the abd pain/nausea-he knows he needs to watch what he is eating but he is not wanting to go to the ER for treatment- Please advise

## 2018-09-25 MED ORDER — PROMETHAZINE HCL 25 MG PO TABS: 25.0000 mg | ORAL_TABLET | Freq: Three times a day (TID) | ORAL | 2 refills | Status: DC | PRN

## 2018-09-25 NOTE — Telephone Encounter (Signed)
Dr. Reino Kent on Phenergan order please =quantity and refills; Also, patient reports he does not want to have the RX for the Ultracet due to being a pain clinic patient and he would have to get permission to have that RX-he is not willing to call them at this time for clearance;  Patient reports he has been NPO for 24 hrs over the weekend and plans to slowly add foods back to diet -and will start with bland foods. Patient has been scheduled for an OV on 10/11/2018 at 10:40am;

## 2018-09-25 NOTE — Telephone Encounter (Signed)
Order for Phenergan placed in Epic  per MD recommendations;

## 2018-09-25 NOTE — Telephone Encounter (Signed)
Phenergan 25 mg p.o. every 8 hours as needed (30), 2 refiils RG

## 2018-10-08 ENCOUNTER — Other Ambulatory Visit: Payer: Self-pay | Admitting: Gastroenterology

## 2018-10-11 ENCOUNTER — Ambulatory Visit: Payer: Medicare Other | Admitting: Gastroenterology

## 2018-10-30 ENCOUNTER — Other Ambulatory Visit: Payer: Self-pay

## 2018-10-30 ENCOUNTER — Telehealth: Payer: Self-pay | Admitting: Gastroenterology

## 2018-10-30 DIAGNOSIS — K7460 Unspecified cirrhosis of liver: Secondary | ICD-10-CM

## 2018-10-30 NOTE — Telephone Encounter (Signed)
-----   Message from Katha Hamming sent at 10/30/2018  2:22 PM EDT ----- Regarding: ct angio abd/pelvis Susanne Borders called and scheduled his CT for next week.   The CT technologist said the order should be changed to a CT abd/pelvis w/wo.    Not CT angio.   The patient will also need to get labs if you could kindly help him with that.  Thank you so much! Hoyle Sauer

## 2018-10-30 NOTE — Telephone Encounter (Signed)
BMP entered into Epic needed for CT to be performed;   Called and spoke with patient- patient advised of need for lab work to be done at [facility] office prior to CT scan; patient is also aware of need to pick up contrast from the [facility] office, which has been placed at the front desk on the 3rd floor;  patient advised to call back to the office should questions/concerns arise; Patient verbalized understanding of information/instructions;

## 2018-10-30 NOTE — Telephone Encounter (Signed)
Pt would like to schedule CT.

## 2018-10-30 NOTE — Telephone Encounter (Signed)
Called and spoke with patient-informed patient of number of radiology at [facility] for him to call and schedule his CT angio; patient also advised to call back to the office should patient have questions/concerns arise; Patient verbalized understanding of information/instructions;

## 2018-10-31 ENCOUNTER — Other Ambulatory Visit (INDEPENDENT_AMBULATORY_CARE_PROVIDER_SITE_OTHER): Payer: Medicare Other

## 2018-10-31 DIAGNOSIS — K7460 Unspecified cirrhosis of liver: Secondary | ICD-10-CM | POA: Diagnosis not present

## 2018-10-31 LAB — BASIC METABOLIC PANEL
BUN: 13 mg/dL (ref 6–23)
CO2: 27 mEq/L (ref 19–32)
Calcium: 8.4 mg/dL (ref 8.4–10.5)
Chloride: 99 mEq/L (ref 96–112)
Creatinine, Ser: 1.52 mg/dL — ABNORMAL HIGH (ref 0.40–1.50)
GFR: 45.93 mL/min — ABNORMAL LOW (ref >60.00)
Glucose, Bld: 214 mg/dL — ABNORMAL HIGH (ref 70–99)
Potassium: 4 mEq/L (ref 3.5–5.1)
Sodium: 136 mEq/L (ref 135–145)

## 2018-11-09 ENCOUNTER — Ambulatory Visit (HOSPITAL_BASED_OUTPATIENT_CLINIC_OR_DEPARTMENT_OTHER)
Admission: RE | Admit: 2018-11-09 | Discharge: 2018-11-09 | Disposition: A | Discharge status: Home / Self Care | Payer: Medicare Other | Source: Ambulatory Visit | Attending: Gastroenterology | Admitting: Gastroenterology

## 2018-11-09 ENCOUNTER — Other Ambulatory Visit: Payer: Self-pay

## 2018-11-09 ENCOUNTER — Encounter (HOSPITAL_BASED_OUTPATIENT_CLINIC_OR_DEPARTMENT_OTHER): Payer: Self-pay

## 2018-11-09 DIAGNOSIS — K859 Acute pancreatitis without necrosis or infection, unspecified: Secondary | ICD-10-CM | POA: Insufficient documentation

## 2018-11-09 DIAGNOSIS — K863 Pseudocyst of pancreas: Secondary | ICD-10-CM | POA: Diagnosis present

## 2018-11-09 MED ORDER — IOHEXOL 350 MG/ML SOLN
100.0000 mL | Freq: Once | INTRAVENOUS | Status: AC | PRN
  Administered 2018-11-09: 08:00:00 100 mL via INTRAVENOUS

## 2018-12-22 ENCOUNTER — Other Ambulatory Visit: Payer: Self-pay | Admitting: Internal Medicine

## 2018-12-25 NOTE — Telephone Encounter (Signed)
Prescription refill request for Eliquis received.  Last office visit: 01/09/2018, Allred Scr:  1.52, 10/31/2018 Age: 68 y.o. Weight: 108.4 kg  Prescription refill sent.

## 2019-01-03 ENCOUNTER — Telehealth: Payer: Self-pay | Admitting: Gastroenterology

## 2019-01-03 DIAGNOSIS — R109 Unspecified abdominal pain: Secondary | ICD-10-CM

## 2019-01-03 DIAGNOSIS — R197 Diarrhea, unspecified: Secondary | ICD-10-CM

## 2019-01-03 NOTE — Telephone Encounter (Signed)
Called and spoke with patient-informed of need to submit stool sample in appropriate container (lab provider containers)-not a tupperware container; Patient verbalized understanding of information/instructions;   Patient advised to call back to the office at [phone] should questions/concerns arise;

## 2019-01-03 NOTE — Telephone Encounter (Signed)
I have reviewed the CT scan performed October 2020. Patient with recurrent pancreatitis and liver cirrhosis Weight loss is likely the fluid he has lost.  He is on diuretics.  Plan: -Stool studies for GI Pathogen (includes C. Diff), WBC, fecal elastase, fat. -CBC, CMP, lipase, CRP -For diarrhea can use Imodium on as-needed basis. 1-2 Q6-8 hrs prn (max 6/day) -Encourage p.o. -Let us know how he is in next 48 hours. -We need to know if his abdominal pain worsens or he develops any fever/chills.  RG

## 2019-01-03 NOTE — Telephone Encounter (Signed)
Called and spoke with patient-patient informed of MD recommendations; patient is agreeable with plan of care and orders have been placed in Epic; Patient verbalized understanding of information/instructions;  Patient was advised to call the office at [phone] if questions/concerns arise;

## 2019-01-03 NOTE — Telephone Encounter (Signed)
Left message for [name], [facility] RN for a call back to the office concerning this patient;

## 2019-01-03 NOTE — Telephone Encounter (Signed)
Misty returned call to the office-patient has reported to her that he has had diarrhea and abd pain since Monday and has been taking his medication (diuretics)- has been able to eat toast and crackers-nothing more abd pain has resolved since Monday but is still having diarrhea; weight on Monday 250 lbs=(today) Wednesday 241 lbs Urine clear yellow; denies any other symptoms except for fatigue ("feeling weak") BP=112/71; P=86;  Please advise

## 2019-01-03 NOTE — Telephone Encounter (Signed)
Misty, a nurse with [facility] would like to speak with you about pt's health. She states that he has been experiencing diarrhea since Monday and has already lost 9 lbs. He has been drinking very little so she is concerned because pt has had pancreatitis issues. Pls call her.

## 2019-01-04 ENCOUNTER — Other Ambulatory Visit (INDEPENDENT_AMBULATORY_CARE_PROVIDER_SITE_OTHER): Payer: Medicare Other

## 2019-01-04 DIAGNOSIS — R109 Unspecified abdominal pain: Secondary | ICD-10-CM | POA: Diagnosis not present

## 2019-01-04 DIAGNOSIS — R197 Diarrhea, unspecified: Secondary | ICD-10-CM

## 2019-01-04 LAB — CBC WITH DIFFERENTIAL/PLATELET
Basophils Absolute: 0.1 10*3/uL (ref 0.0–0.1)
Basophils Relative: 1 % (ref 0.0–3.0)
Eosinophils Absolute: 0.2 10*3/uL (ref 0.0–0.7)
Eosinophils Relative: 2.4 % (ref 0.0–5.0)
HCT: 43 % (ref 39.0–52.0)
Hemoglobin: 14.3 g/dL (ref 13.0–17.0)
Lymphocytes Relative: 13.6 % (ref 12.0–46.0)
Lymphs Abs: 0.9 10*3/uL (ref 0.7–4.0)
MCHC: 33.1 g/dL (ref 30.0–36.0)
MCV: 85.9 fl (ref 78.0–100.0)
Monocytes Absolute: 0.4 10*3/uL (ref 0.1–1.0)
Monocytes Relative: 5.7 % (ref 3.0–12.0)
Neutro Abs: 4.8 10*3/uL (ref 1.4–7.7)
Neutrophils Relative %: 77.3 % — ABNORMAL HIGH (ref 43.0–77.0)
Platelets: 117 10*3/uL — ABNORMAL LOW (ref 150.0–400.0)
RBC: 5.01 Mil/uL (ref 4.22–5.81)
RDW: 15.6 % — ABNORMAL HIGH (ref 11.5–15.5)
WBC: 6.3 10*3/uL (ref 4.0–10.5)

## 2019-01-04 LAB — COMPREHENSIVE METABOLIC PANEL
ALT: 9 U/L (ref 0–53)
AST: 10 U/L (ref 0–37)
Albumin: 4.3 g/dL (ref 3.5–5.2)
Alkaline Phosphatase: 31 U/L — ABNORMAL LOW (ref 39–117)
BUN: 34 mg/dL — ABNORMAL HIGH (ref 6–23)
CO2: 26 mEq/L (ref 19–32)
Calcium: 9.4 mg/dL (ref 8.4–10.5)
Chloride: 97 mEq/L (ref 96–112)
Creatinine, Ser: 2.24 mg/dL — ABNORMAL HIGH (ref 0.40–1.50)
GFR: 29.35 mL/min — ABNORMAL LOW (ref >60.00)
Glucose, Bld: 139 mg/dL — ABNORMAL HIGH (ref 70–99)
Potassium: 4.3 mEq/L (ref 3.5–5.1)
Sodium: 132 mEq/L — ABNORMAL LOW (ref 135–145)
Total Bilirubin: 0.7 mg/dL (ref 0.2–1.2)
Total Protein: 7.3 g/dL (ref 6.0–8.3)

## 2019-01-04 LAB — LIPASE: Lipase: 204 U/L — ABNORMAL HIGH (ref 11.0–59.0)

## 2019-01-04 LAB — C-REACTIVE PROTEIN: CRP: 2.1 mg/dL (ref 0.5–20.0)

## 2019-01-04 NOTE — Telephone Encounter (Signed)
Steve Anthony from [facility] called to inform that pt had been tested for COVID-19, pending results.  He had labs done this morning and failed to mention this.  FYI.

## 2019-01-04 NOTE — Progress Notes (Signed)
Have discussed with the patient in detail. -Lipase gone up to 204 (N 11-59).  Had abdominal pain Monday 12/7, resolved completely 12/8.  Currently able to tolerate low-fat diet without any problems. -Creatinine has increased from 1.5 to 2.24.  I have told him to stop Lasix and make appointment with Dr. Bea Graff.  Not a candidate for contrast CT. -Platelets have gone down slightly. -Stool studies are pending.  His diarrhea is better.  RG  Bre, please send above to Dr. Bea Graff.  Make FU appointment with me/APP clinic in 4 weeks.  RG

## 2019-01-05 NOTE — Telephone Encounter (Signed)
Called and spoke with patient-patient reports he went to the health department and had the test done "for my own reasons"; patient was advised to please call the office and inform of results; Patient verbalized understanding of information/instructions;  Patient advised to call back to the office at [phone] should questions/concerns arise;

## 2019-01-08 ENCOUNTER — Telehealth: Payer: Self-pay | Admitting: Gastroenterology

## 2019-01-08 NOTE — Telephone Encounter (Signed)
Called and spoke with patient-stool is not as loose, abd no longer hurts, not running a fever, no vomiting, BP normalizing BP 103/76, HR 75, "I am doing better"= patient reports he is feeling better with being able to eat and "pee without problems" weight= 242 lbs today; patient advised to call back if symptoms return or symptoms do not resolve, Patient advised to call back to the office at [phone] should questions/concerns arise;  Patient verbalized understanding of information/instructions;

## 2019-01-10 ENCOUNTER — Telehealth: Payer: Self-pay | Admitting: Gastroenterology

## 2019-01-10 ENCOUNTER — Telehealth (INDEPENDENT_AMBULATORY_CARE_PROVIDER_SITE_OTHER): Payer: Medicare Other | Admitting: Internal Medicine

## 2019-01-10 ENCOUNTER — Other Ambulatory Visit: Payer: Self-pay

## 2019-01-10 DIAGNOSIS — I2510 Atherosclerotic heart disease of native coronary artery without angina pectoris: Secondary | ICD-10-CM | POA: Diagnosis not present

## 2019-01-10 DIAGNOSIS — I10 Essential (primary) hypertension: Secondary | ICD-10-CM

## 2019-01-10 DIAGNOSIS — I480 Paroxysmal atrial fibrillation: Secondary | ICD-10-CM

## 2019-01-10 NOTE — Telephone Encounter (Signed)
Pt called to inform you that his test was negative.

## 2019-01-10 NOTE — Telephone Encounter (Signed)
Per patient-COVID screen neg

## 2019-01-10 NOTE — Progress Notes (Signed)
Electrophysiology TeleHealth Note  Due to national recommendations of social distancing due to [place] 19, an audio telehealth visit is felt to be most appropriate for this patient at this time.  Verbal consent was obtained by me for the telehealth visit today.  The patient does not have capability for a virtual visit.  A phone visit is therefore required today.   Date:  01/10/2019   ID:  Steve Anthony, DOB 15-Jul-1950, MRN [id number]  Location: patient's home  Provider location:  [facility]  Evaluation Performed: Follow-up visit  PCP:  [name], MD   Electrophysiologist:  Dr Rayann Heman  Chief Complaint:  palpitations  History of Present Illness:    Steve Anthony is a 68 y.o. male who presents via telehealth conferencing today.  Since last being seen in our clinic, the patient reports doing very well.  Today, he denies symptoms of palpitations, chest pain, shortness of breath,  lower extremity edema, dizziness, presyncope, or syncope.  The patient is otherwise without complaint today.  The patient denies symptoms of fevers, chills, cough, or new SOB worrisome for COVID 19.  Past Medical History:  Diagnosis Date  . Arthritis   . CAD (coronary artery disease)    a. S/P PCI mid LAD (vision stent) 08/31/2004;  b. repeat cath 6/11 - no progression of CAD. c. Cath 10/2012 given moderate CAD on cardiac CT - no change from prior cath.  . Chest pain 12/16/2016  . COPD (chronic obstructive pulmonary disease) ([place])   . DJD (degenerative joint disease) of lumbar spine   . Dyslipidemia   . Gallstones   . GERD (gastroesophageal reflux disease)   . History of kidney stones   . Hypertension   . Hyperthyroidism   . Melanoma ([place])    melanoma removed from neck   . On home oxygen therapy    "2L q hs" (11/08/2012)  . OSA (obstructive sleep apnea)    a. cpap noncompliance- patient reports on 12/10/14- does not use CPAP  . Palpitations    a. 10/2012 - s/p LINQ loop recorder to assess  for arrhythmia.   . Paroxysmal A-fib ([place])    a. S/P PVI 2/11 and 9/11  . SVT (supraventricular tachycardia) ([place])    a. Atypical AVNRT of the slow pathway  . Type II diabetes mellitus ([place])     Past Surgical History:  Procedure Laterality Date  . ATRIAL ABLATION SURGERY  2007   S/P slow pathway ablation for atypical AVNRT   . BIOPSY  07/17/2018   Procedure: BIOPSY;  Surgeon: Rush Landmark Telford Nab., MD;  Location: [place];  Service: Gastroenterology;;  . Lorin Mercy  ~ 2008  . COLON RESECTION  02/2013   . COLONOSCOPY  12/25/2007   Colonic polyp status post polypectomy. Internal hemorrhoids.   . CORONARY ANGIOPLASTY WITH STENT PLACEMENT  08/30/2004   Vision; to LAD   . CYSTOSCOPY W/ STONE MANIPULATION  ~ 1998   "once" (11/08/2012)  . EP Study  2008   Negative EP study in [place]  . ESOPHAGOGASTRODUODENOSCOPY (EGD) WITH PROPOFOL N/A 07/17/2018   Procedure: ESOPHAGOGASTRODUODENOSCOPY (EGD) WITH PROPOFOL;  Surgeon: Rush Landmark Telford Nab., MD;  Location: [place];  Service: Gastroenterology;  Laterality: N/A;  . FRACTURE SURGERY    . LEFT HEART CATHETERIZATION WITH CORONARY ANGIOGRAM N/A 11/10/2012   Procedure: LEFT HEART CATHETERIZATION WITH CORONARY ANGIOGRAM;  Surgeon: Burnell Blanks, MD;  Location: [facility] C[facility] LAB;  Service: Cardiovascular;  Laterality: N/A;  . LOOP RECORDER IMPLANT N/A  11/10/2012   Medtronic LinQ implanted by Dr Rayann Heman for afib management  . [place] SURGERY  2000; 2002  . POSTERIOR LUMBAR FUSION  2004  . PROSTATE SURGERY  ~ 2009  . PVI/ CTI ablation  02/25/2009 and 09/2009   S/P afib/ CTI ablation  . TIBIA FRACTURE SURGERY Right 1990   "broke in 3 places; had rod put in" (11/08/2012)  . TIBIA HARDWARE REMOVAL Right ~ 1991  . TONSILLECTOMY  1965  . TOTAL HIP ARTHROPLASTY Right 12/18/2014   Procedure: RIGHT TOTAL HIP ARTHROPLASTY ANTERIOR APPROACH;  Surgeon: Gaynelle Arabian, MD;  [facility];  Service: Orthopedics;  Laterality: Right;  .  UPPER ESOPHAGEAL ENDOSCOPIC ULTRASOUND (EUS) N/A 07/17/2018   Procedure: UPPER ESOPHAGEAL ENDOSCOPIC ULTRASOUND (EUS);  Surgeon: Irving Copas., MD;  Location: [place];  Service: Gastroenterology;  Laterality: N/A;    Current Outpatient Medications  Medication Sig Dispense Refill  . acetaminophen (TYLENOL) 650 MG CR tablet Take 1,300 mg by mouth every 8 (eight) hours as needed for pain.    . Aspirin-Salicylamide-Caffeine (BC HEADACHE PO) Take 1 packet by mouth daily as needed (headaches).    Marland Kitchen atorvastatin (LIPITOR) 40 MG tablet Take 40 mg by mouth daily.     . Cyanocobalamin (VITAMIN B-12 IJ) Inject 1,000 mcg as directed every 30 (thirty) days.    Mariane Baumgarten Sodium (COLACE PO) Take 1 tablet by mouth daily.    Marland Kitchen ELIQUIS 5 MG TABS tablet TAKE 1 TABLET BY MOUTH TWICE A DAY 180 tablet 1  . escitalopram (LEXAPRO) 10 MG tablet Take 10 mg by mouth daily.  1  . fenofibrate 160 MG tablet Take 160 mg by mouth daily.      . ferrous sulfate 325 (65 FE) MG tablet Take 325 mg by mouth daily with breakfast.    . furosemide (LASIX) 20 MG tablet Take 40 mg by mouth daily.     Marland Kitchen gabapentin (NEURONTIN) 300 MG capsule Take 300 mg by mouth 3 (three) times daily.    . Melatonin 10 MG TABS Take 10 mg by mouth at bedtime.     . metFORMIN (GLUCOPHAGE) 500 MG tablet Take 500 mg by mouth 2 (two) times daily.    . metoprolol succinate (TOPROL-XL) 50 MG 24 hr tablet TAKE 1 TABLET BY MOUTH TWICE A DAY (Patient taking differently: Take 50 mg by mouth daily. ) 60 tablet 3  . naloxone (NARCAN) 4 MG/0.1ML LIQD nasal spray kit Place 1 spray into the nose daily as needed (opioid overdose).    . nitroGLYCERIN (NITROSTAT) 0.4 MG SL tablet Place 1 tablet (0.4 mg total) under the tongue every 5 (five) minutes x 3 doses as needed for chest pain. 25 tablet 1  . OVER THE COUNTER MEDICATION Apply 1 application topically daily as needed (rash). OTC fanny cream    . oxyCODONE-acetaminophen (PERCOCET) 10-325 MG tablet Take 1  tablet by mouth every 4 (four) hours as needed for pain.     . OXYGEN Inhale 2 L into the lungs as needed.    . pantoprazole (PROTONIX) 40 MG tablet TAKE 1 TABLET BY MOUTH TWICE A DAY 180 tablet 1  . promethazine (PHENERGAN) 25 MG tablet Take 1 tablet (25 mg total) by mouth every 8 (eight) hours as needed for nausea or vomiting. 30 tablet 2  . spironolactone (ALDACTONE) 25 MG tablet Take 25 mg by mouth daily.  1  . Tamsulosin HCl (FLOMAX) 0.4 MG CAPS Take 0.4 mg by mouth daily.      Marland Kitchen  tiZANidine (ZANAFLEX) 4 MG tablet Take 4 mg by mouth at bedtime.    . traZODone (DESYREL) 50 MG tablet Take 100 mg by mouth at bedtime.      No current facility-administered medications for this visit.    Allergies:   Penicillins   Social History:  The patient  reports that he quit smoking about 14 years ago. His smoking use included cigarettes. He has a 114.00 pack-year smoking history. He quit smokeless tobacco use about 14 years ago.  His smokeless tobacco use included chew. He reports current alcohol use. He reports that he does not use drugs.   Family History:  The patient's family history includes Diabetes in his father; Emphysema in his maternal grandfather and mother; Heart attack in his father; Heart disease in his father and paternal grandfather; Hypertension in his father.   ROS:  Please see the history of present illness.   All other systems are personally reviewed and negative.    Exam:    Vital Signs:  There were no vitals taken for this visit.  Well sounding, alert and conversant   Labs/Other Tests and Data Reviewed:    Recent Labs: 01/04/2019: ALT 9; BUN 34; Creatinine, Ser 2.24; Hemoglobin 14.3; Platelets 117.0; Potassium 4.3; Sodium 132   Wt Readings from Last 3 Encounters:  08/09/18 241 lb (109.3 kg)  04/11/18 239 lb (108.4 kg)  03/16/18 234 lb 6 oz (106.3 kg)     ASSESSMENT & PLAN:    1.  Paroxysmal atrial fibrillation chads2vasc score is 4.  He is on eliquis for stroke  prevention.  2. HTN Stable No change required today  3. CAD No ischemic symptoms S/p prior PCI of mid LAD   Follow-up:  12 months with EP NP   Patient Risk:  after full review of this patients clinical status, I feel that they are at moderate risk at this time.  Today, I have spent 15 minutes with the patient with telehealth technology discussing arrhythmia management .    Steve Fossa, MD  01/10/2019 10:59 AM     [facility] [address] [place] [facility] [place] [address] [phone] (office) [phone] (fax)

## 2019-01-12 LAB — GASTROINTESTINAL PATHOGEN PANEL PCR
C. difficile Tox A/B, PCR: NOT DETECTED
Campylobacter, PCR: NOT DETECTED
Cryptosporidium, PCR: NOT DETECTED
E coli (ETEC) LT/ST PCR: NOT DETECTED
E coli (STEC) stx1/stx2, PCR: NOT DETECTED
E coli 0157, PCR: NOT DETECTED
Giardia lamblia, PCR: NOT DETECTED
Norovirus, PCR: NOT DETECTED
Rotavirus A, PCR: NOT DETECTED
Salmonella, PCR: NOT DETECTED
Shigella, PCR: NOT DETECTED

## 2019-01-12 LAB — FECAL FAT, QUANTITATIVE: Specimen Total Weight: 4 g

## 2019-01-12 LAB — FECAL LACTOFERRIN, QUANT
Fecal Lactoferrin: POSITIVE — AB
MICRO NUMBER:: 1184886
SPECIMEN QUALITY:: ADEQUATE

## 2019-01-12 LAB — TIQ-MISC

## 2019-01-12 LAB — PANCREATIC ELASTASE, FECAL: Pancreatic Elastase-1, Stool: 216 mcg/g

## 2019-01-24 ENCOUNTER — Other Ambulatory Visit: Payer: Self-pay

## 2019-01-24 ENCOUNTER — Telehealth: Payer: Self-pay

## 2019-01-24 DIAGNOSIS — K7460 Unspecified cirrhosis of liver: Secondary | ICD-10-CM

## 2019-01-24 DIAGNOSIS — R197 Diarrhea, unspecified: Secondary | ICD-10-CM

## 2019-01-24 DIAGNOSIS — R109 Unspecified abdominal pain: Secondary | ICD-10-CM

## 2019-01-24 NOTE — Telephone Encounter (Signed)
Order has been placed for patient to have a CT abd with contrast in 6 months- staff msg sent to [place] for reminder call to the patient

## 2019-01-24 NOTE — Telephone Encounter (Signed)
-----   Message from Timothy Lasso, RN sent at 07/24/2018  3:08 PM EDT ----- Patient will need a follow-up CT abdomen with contrast in 6 months.

## 2019-02-01 ENCOUNTER — Telehealth (INDEPENDENT_AMBULATORY_CARE_PROVIDER_SITE_OTHER): Payer: Medicare PPO | Admitting: Gastroenterology

## 2019-02-01 ENCOUNTER — Encounter: Payer: Self-pay | Admitting: Gastroenterology

## 2019-02-01 ENCOUNTER — Other Ambulatory Visit: Payer: Self-pay

## 2019-02-01 VITALS — Ht 72.0 in | Wt 242.0 lb

## 2019-02-01 DIAGNOSIS — K7460 Unspecified cirrhosis of liver: Secondary | ICD-10-CM | POA: Diagnosis not present

## 2019-02-01 DIAGNOSIS — K863 Pseudocyst of pancreas: Secondary | ICD-10-CM | POA: Diagnosis not present

## 2019-02-01 NOTE — Patient Instructions (Signed)
If you are age 68 or older, your body mass index should be between 23-30. Your Body mass index is 32.82 kg/m. If this is out of the aforementioned range listed, please consider follow up with your Primary Care Provider.  If you are age 6 or younger, your body mass index should be between 19-25. Your Body mass index is 32.82 kg/m. If this is out of the aformentioned range listed, please consider follow up with your Primary Care Provider.   Continue low salt diet.  Avoid fatty foods.  Continue Protonix and Spironolactone.   You are due for a CT Scan in March 2021. You will be contacted closer to this time to have this scheduled.   Thank you,  Dr. Jackquline Denmark

## 2019-02-01 NOTE — Progress Notes (Signed)
Chief Complaint: FU pancreatic pseudocyst  Referring Provider:  [name], MD      ASSESSMENT AND PLAN;   #1. H/O acute idiopathic recurrent pancreatitis with asymptomatic pancreatic tail pseudocyst. EUS (Dr Rush Landmark) 06/2018 6.5 cm x 5.4 cm pseudocyst with surrounding blood vessels and too far for AXIOS. Being managed conservatively. CT 10/2018: no change in pancreatic pseudocyst size.  (Adm RH 02/15/2018, June, July, dec 2019, 08/2010) with  pancreatic tail pseudocyst 7 x 9 cm (on CT urogram 09/22/2017). H/O remote ETOH abuse, none since 1989. S/P chole in past. H/O hypertriglyceridemia. Nl LFTs 08/2017, most recent TG 278 (09/30/2017), Nl lipase.  No clinical exocrine insufficiency or pancreatic calcification on noncontrast CT. No chronic pancreatitis on EUS.  #2. Cryptogenic liver cirrhosis (likely d/t NASH, Dx 02/2018 on CT) with portal hypertension- moderate splenomegaly (on CT), mild thrombocytopenia, portal venous collaterals. Gd 1 eso varices on EGD (06/2018). Neg WU for etiology 2020. Immune to A but not to B. No ascites or HE. Nl AFP.  #3. Multiple comorbid conditions including A. fib on Eliquis, COPD on O2, OSA, CKD3 (Cr 1.59 09/2017), CAD, dCHF, DM2, obesity, remote PE.  #4. Has Covid-19 currently-asymptomatic.  Plan: -CT pancreatic protocol in March 2021. -FU after CT. -Continue low salt diet.  Avoid fatty fried foods. -Continue taking Protonix, Spironolactone.   HPI:    Steve Anthony is a 69 y.o. male  For follow-up visit. Doing very well except had COVID-19 1/3, (got it from his son-in-law).  His wife has it now.  He is still recovering.  Did not have any significant problems with it. Just cough. Has to stay inside until 1/19  No abdominal pain.  No nausea, vomiting, heartburn, regurgitation, odynophagia or dysphagia.  No significant diarrhea or constipation.  He takes stool softerners 3/day d/t pain meds.  There is no melena or hematochezia. No unintentional  weight loss.  Good appetite.  S/P EGD/EUS as below.    Previous imaging studies : - NCCT scan 09/22/2017 -showed large 7 into 9 cm pancreatic pseudocyst in the tail of the pancreas. - CT Abdo/pelvis with IV contrast  11/09/2018, 05/10/2018:  1. Pancreatic tail pseudocyst .7.5 x 6.5 by 5.9 cm 2. Hepatic cirrhosis and mild steatosis. No evidence of hepatic neoplasm. 3. Stable splenomegaly. -MRCP 02/2018 ? Hemo psudocyst -EGD/EUS 07/17/2018: Grade 1 esophageal varices, portal hypertensive gastropathy, cystic 6.5 induced 5.4 cm pancreatic tail pseudocyst with surrounding blood vessels.   SH- daughter is a Therapist, sports, [employer] of [place]  (April phone [phone]) Past Medical History:  Diagnosis Date  . Arthritis   . CAD (coronary artery disease)    a. S/P PCI mid LAD (vision stent) 08/31/2004;  b. repeat cath 6/11 - no progression of CAD. c. Cath 10/2012 given moderate CAD on cardiac CT - no change from prior cath.  . Chest pain 12/16/2016  . COPD (chronic obstructive pulmonary disease) ([place])   . COVID-19 virus infection   . DJD (degenerative joint disease) of lumbar spine   . Dyslipidemia   . Gallstones   . GERD (gastroesophageal reflux disease)   . History of kidney stones   . Hypertension   . Hyperthyroidism   . Melanoma ([place])    melanoma removed from neck   . On home oxygen therapy    "2L q hs" (11/08/2012)  . OSA (obstructive sleep apnea)    a. cpap noncompliance- patient reports on 12/10/14- does not use CPAP  . Palpitations    a. 10/2012 -  s/p LINQ loop recorder to assess for arrhythmia.   . Paroxysmal A-fib ([place])    a. S/P PVI 2/11 and 9/11  . SVT (supraventricular tachycardia) ([place])    a. Atypical AVNRT of the slow pathway  . Type II diabetes mellitus ([place])     Past Surgical History:  Procedure Laterality Date  . ATRIAL ABLATION SURGERY  2007   S/P slow pathway ablation for atypical AVNRT   . BIOPSY  07/17/2018   Procedure: BIOPSY;  Surgeon: Rush Landmark Telford Nab., MD;  Location: [place];  Service: Gastroenterology;;  . Lorin Mercy  ~ 2008  . COLON RESECTION  02/2013   . COLONOSCOPY  12/25/2007   Colonic polyp status post polypectomy. Internal hemorrhoids.   . CORONARY ANGIOPLASTY WITH STENT PLACEMENT  08/30/2004   Vision; to LAD   . CYSTOSCOPY W/ STONE MANIPULATION  ~ 1998   "once" (11/08/2012)  . EP Study  2008   Negative EP study in [place]  . ESOPHAGOGASTRODUODENOSCOPY (EGD) WITH PROPOFOL N/A 07/17/2018   Procedure: ESOPHAGOGASTRODUODENOSCOPY (EGD) WITH PROPOFOL;  Surgeon: Rush Landmark Telford Nab., MD;  Location: [place];  Service: Gastroenterology;  Laterality: N/A;  . FRACTURE SURGERY    . LEFT HEART CATHETERIZATION WITH CORONARY ANGIOGRAM N/A 11/10/2012   Procedure: LEFT HEART CATHETERIZATION WITH CORONARY ANGIOGRAM;  Surgeon: Burnell Blanks, MD;  Location: [facility] C[facility] LAB;  Service: Cardiovascular;  Laterality: N/A;  . LOOP RECORDER IMPLANT N/A 11/10/2012   Medtronic LinQ implanted by Dr Rayann Heman for afib management  . [place] SURGERY  2000; 2002  . POSTERIOR LUMBAR FUSION  2004  . PROSTATE SURGERY  ~ 2009  . PVI/ CTI ablation  02/25/2009 and 09/2009   S/P afib/ CTI ablation  . TIBIA FRACTURE SURGERY Right 1990   "broke in 3 places; had rod put in" (11/08/2012)  . TIBIA HARDWARE REMOVAL Right ~ 1991  . TONSILLECTOMY  1965  . TOTAL HIP ARTHROPLASTY Right 12/18/2014   Procedure: RIGHT TOTAL HIP ARTHROPLASTY ANTERIOR APPROACH;  Surgeon: Gaynelle Arabian, MD;  [facility];  Service: Orthopedics;  Laterality: Right;  . UPPER ESOPHAGEAL ENDOSCOPIC ULTRASOUND (EUS) N/A 07/17/2018   Procedure: UPPER ESOPHAGEAL ENDOSCOPIC ULTRASOUND (EUS);  Surgeon: Irving Copas., MD;  Location: [place];  Service: Gastroenterology;  Laterality: N/A;    Family History  Problem Relation Age of Onset  . Emphysema Mother   . Heart attack Father        CVA  . Hypertension Father   . Diabetes Father   . Heart disease Father         MI  . Emphysema Maternal Grandfather        Smoker  . Heart disease Paternal Grandfather   . Colon cancer Neg Hx   . Esophageal cancer Neg Hx   . Inflammatory bowel disease Neg Hx   . Liver disease Neg Hx   . Pancreatic cancer Neg Hx   . Rectal cancer Neg Hx   . Stomach cancer Neg Hx     Social History   Tobacco Use  . Smoking status: Former Smoker    Packs/day: 3.00    Years: 38.00    Pack years: 114.00    Types: Cigarettes    Quit date: 03/25/2004    Years since quitting: 14.8  . Smokeless tobacco: Former Systems developer    Types: Chew    Quit date: 04/25/2004  Substance Use Topics  . Alcohol use: Not Currently    Comment:  "quit drinking in 1989"  . Drug  use: No    Current Outpatient Medications  Medication Sig Dispense Refill  . acetaminophen (TYLENOL) 650 MG CR tablet Take 1,300 mg by mouth every 8 (eight) hours as needed for pain.    Marland Kitchen atorvastatin (LIPITOR) 40 MG tablet Take 40 mg by mouth daily.     . Cyanocobalamin (VITAMIN B-12 IJ) Inject 1,000 mcg as directed every 30 (thirty) days.    Mariane Baumgarten Sodium (COLACE PO) Take 1 tablet by mouth daily.    Marland Kitchen ELIQUIS 5 MG TABS tablet TAKE 1 TABLET BY MOUTH TWICE A DAY 180 tablet 1  . escitalopram (LEXAPRO) 10 MG tablet Take 10 mg by mouth daily.  1  . fenofibrate 160 MG tablet Take 160 mg by mouth daily.      . ferrous sulfate 325 (65 FE) MG tablet Take 325 mg by mouth as directed. Take 1 tablet 2 times a week    . furosemide (LASIX) 20 MG tablet Take 40 mg by mouth daily.     Marland Kitchen gabapentin (NEURONTIN) 300 MG capsule Take 300 mg by mouth 3 (three) times daily.    . Melatonin 10 MG TABS Take 10 mg by mouth at bedtime.     . metFORMIN (GLUCOPHAGE) 500 MG tablet Take 500 mg by mouth 2 (two) times daily.    . metoprolol succinate (TOPROL-XL) 50 MG 24 hr tablet TAKE 1 TABLET BY MOUTH TWICE A DAY (Patient taking differently: Take 50 mg by mouth daily. ) 60 tablet 3  . OVER THE COUNTER MEDICATION Apply 1 application topically daily as  needed (rash). OTC fanny cream    . oxyCODONE-acetaminophen (PERCOCET) 10-325 MG tablet Take 1 tablet by mouth every 4 (four) hours as needed for pain.     . pantoprazole (PROTONIX) 40 MG tablet TAKE 1 TABLET BY MOUTH TWICE A DAY 180 tablet 1  . promethazine (PHENERGAN) 25 MG tablet Take 1 tablet (25 mg total) by mouth every 8 (eight) hours as needed for nausea or vomiting. 30 tablet 2  . spironolactone (ALDACTONE) 25 MG tablet Take 25 mg by mouth daily.  1  . Tamsulosin HCl (FLOMAX) 0.4 MG CAPS Take 0.4 mg by mouth daily.      Marland Kitchen tiZANidine (ZANAFLEX) 4 MG tablet Take 4 mg by mouth at bedtime.    . traZODone (DESYREL) 50 MG tablet Take 100 mg by mouth at bedtime.     . naloxone (NARCAN) 4 MG/0.1ML LIQD nasal spray kit Place 1 spray into the nose daily as needed (opioid overdose).    . nitroGLYCERIN (NITROSTAT) 0.4 MG SL tablet Place 1 tablet (0.4 mg total) under the tongue every 5 (five) minutes x 3 doses as needed for chest pain. (Patient not taking: Reported on 02/01/2019) 25 tablet 1  . OXYGEN Inhale 2 L into the lungs as needed.     No current facility-administered medications for this visit.    Allergies  Allergen Reactions  . Penicillins Hives and Shortness Of Breath    Did it involve swelling of the face/tongue/throat, SOB, or low BP? Yes Did it involve sudden or severe rash/hives, skin peeling, or any reaction on the inside of your mouth or nose? No Did you need to seek medical attention at a hospital or doctor's office? Yes When did it last happen?40 + years If all above answers are "NO", may proceed with cephalosporin use.      Review of Systems:  neg     Physical Exam:    Ht 6' (1.829 m)  Wt 242 lb (109.8 kg)   BMI 32.82 kg/m  Filed Weights   02/01/19 0819  Weight: 242 lb (109.8 kg)   Constitutional:  Well-developed, in no acute distress. Psychiatric: Normal mood and affect. Behavior is normal. Doxy video visit  Data Reviewed: I have personally reviewed  following labs and imaging studies  CBC Latest Ref Rng & Units 01/04/2019 07/14/2018 03/16/2018  WBC 4.0 - 10.5 K/uL 6.3 6.2 5.9  Hemoglobin 13.0 - 17.0 g/dL 14.3 13.8 13.7  Hematocrit 39.0 - 52.0 % 43.0 41.6 42.1  Platelets 150.0 - 400.0 K/uL 117.0(L) 130.0(L) 140.0(L)     CBC: CBC Latest Ref Rng & Units 01/04/2019 07/14/2018 03/16/2018  WBC 4.0 - 10.5 K/uL 6.3 6.2 5.9  Hemoglobin 13.0 - 17.0 g/dL 14.3 13.8 13.7  Hematocrit 39.0 - 52.0 % 43.0 41.6 42.1  Platelets 150.0 - 400.0 K/uL 117.0(L) 130.0(L) 140.0(L)    CMP: CMP Latest Ref Rng & Units 01/04/2019 10/31/2018 07/14/2018  Glucose 70 - 99 mg/dL 139(H) 214(H) 126(H)  BUN 6 - 23 mg/dL 34(H) 13 24(H)  Creatinine 0.40 - 1.50 mg/dL 2.24(H) 1.52(H) 1.78(H)  Sodium 135 - 145 mEq/L 132(L) 136 136  Potassium 3.5 - 5.1 mEq/L 4.3 4.0 4.5  Chloride 96 - 112 mEq/L 97 99 101  CO2 19 - 32 mEq/L 26 27 27   Calcium 8.4 - 10.5 mg/dL 9.4 8.4 8.9  Total Protein 6.0 - 8.3 g/dL 7.3 - 6.4  Total Bilirubin 0.2 - 1.2 mg/dL 0.7 - 0.6  Alkaline Phos 39 - 117 U/L 31(L) - 30(L)  AST 0 - 37 U/L 10 - 18  ALT 0 - 53 U/L 9 - 18    This service was provided via [facility] telemedicine.  The patient was located at home.  The provider was located in office.  The patient did consent to this telephone visit and is aware of possible charges through their insurance for this visit.   Time spent on call/coordination of care: 25 min  Carmell Austria, MD 02/01/2019, 8:29 AM  Cc: [name], MD

## 2019-02-20 ENCOUNTER — Telehealth: Payer: Self-pay | Admitting: Gastroenterology

## 2019-02-20 ENCOUNTER — Other Ambulatory Visit: Payer: Self-pay

## 2019-02-20 ENCOUNTER — Inpatient Hospital Stay (HOSPITAL_COMMUNITY)
Admission: EM | Admit: 2019-02-20 | Discharge: 2019-02-22 | DRG: 439 | Disposition: A | Discharge status: Home / Self Care | Payer: Medicare PPO | Attending: Internal Medicine | Admitting: Internal Medicine

## 2019-02-20 ENCOUNTER — Emergency Department (HOSPITAL_COMMUNITY): Payer: Medicare PPO

## 2019-02-20 ENCOUNTER — Encounter (HOSPITAL_COMMUNITY): Payer: Self-pay | Admitting: *Deleted

## 2019-02-20 DIAGNOSIS — Z79890 Hormone replacement therapy (postmenopausal): Secondary | ICD-10-CM | POA: Diagnosis not present

## 2019-02-20 DIAGNOSIS — E871 Hypo-osmolality and hyponatremia: Secondary | ICD-10-CM | POA: Diagnosis present

## 2019-02-20 DIAGNOSIS — K219 Gastro-esophageal reflux disease without esophagitis: Secondary | ICD-10-CM | POA: Diagnosis not present

## 2019-02-20 DIAGNOSIS — I130 Hypertensive heart and chronic kidney disease with heart failure and stage 1 through stage 4 chronic kidney disease, or unspecified chronic kidney disease: Secondary | ICD-10-CM | POA: Diagnosis present

## 2019-02-20 DIAGNOSIS — Z7901 Long term (current) use of anticoagulants: Secondary | ICD-10-CM | POA: Diagnosis not present

## 2019-02-20 DIAGNOSIS — G4733 Obstructive sleep apnea (adult) (pediatric): Secondary | ICD-10-CM | POA: Diagnosis present

## 2019-02-20 DIAGNOSIS — N183 Chronic kidney disease, stage 3 unspecified: Secondary | ICD-10-CM

## 2019-02-20 DIAGNOSIS — Z9119 Patient's noncompliance with other medical treatment and regimen: Secondary | ICD-10-CM

## 2019-02-20 DIAGNOSIS — N400 Enlarged prostate without lower urinary tract symptoms: Secondary | ICD-10-CM | POA: Diagnosis present

## 2019-02-20 DIAGNOSIS — I4811 Longstanding persistent atrial fibrillation: Secondary | ICD-10-CM | POA: Diagnosis not present

## 2019-02-20 DIAGNOSIS — K7581 Nonalcoholic steatohepatitis (NASH): Secondary | ICD-10-CM | POA: Diagnosis present

## 2019-02-20 DIAGNOSIS — I2510 Atherosclerotic heart disease of native coronary artery without angina pectoris: Secondary | ICD-10-CM | POA: Diagnosis present

## 2019-02-20 DIAGNOSIS — Z9981 Dependence on supplemental oxygen: Secondary | ICD-10-CM | POA: Diagnosis not present

## 2019-02-20 DIAGNOSIS — Z880 Allergy status to penicillin: Secondary | ICD-10-CM

## 2019-02-20 DIAGNOSIS — N1831 Chronic kidney disease, stage 3a: Secondary | ICD-10-CM | POA: Diagnosis not present

## 2019-02-20 DIAGNOSIS — I10 Essential (primary) hypertension: Secondary | ICD-10-CM | POA: Diagnosis not present

## 2019-02-20 DIAGNOSIS — E0590 Thyrotoxicosis, unspecified without thyrotoxic crisis or storm: Secondary | ICD-10-CM | POA: Diagnosis present

## 2019-02-20 DIAGNOSIS — I4891 Unspecified atrial fibrillation: Secondary | ICD-10-CM | POA: Diagnosis present

## 2019-02-20 DIAGNOSIS — I480 Paroxysmal atrial fibrillation: Secondary | ICD-10-CM | POA: Diagnosis present

## 2019-02-20 DIAGNOSIS — K7460 Unspecified cirrhosis of liver: Secondary | ICD-10-CM

## 2019-02-20 DIAGNOSIS — Z85820 Personal history of malignant melanoma of skin: Secondary | ICD-10-CM

## 2019-02-20 DIAGNOSIS — Z981 Arthrodesis status: Secondary | ICD-10-CM

## 2019-02-20 DIAGNOSIS — M1990 Unspecified osteoarthritis, unspecified site: Secondary | ICD-10-CM | POA: Diagnosis present

## 2019-02-20 DIAGNOSIS — F1011 Alcohol abuse, in remission: Secondary | ICD-10-CM | POA: Diagnosis present

## 2019-02-20 DIAGNOSIS — I4821 Permanent atrial fibrillation: Secondary | ICD-10-CM | POA: Diagnosis present

## 2019-02-20 DIAGNOSIS — E785 Hyperlipidemia, unspecified: Secondary | ICD-10-CM | POA: Diagnosis present

## 2019-02-20 DIAGNOSIS — K859 Acute pancreatitis without necrosis or infection, unspecified: Secondary | ICD-10-CM | POA: Diagnosis present

## 2019-02-20 DIAGNOSIS — K858 Other acute pancreatitis without necrosis or infection: Principal | ICD-10-CM | POA: Diagnosis present

## 2019-02-20 DIAGNOSIS — E1122 Type 2 diabetes mellitus with diabetic chronic kidney disease: Secondary | ICD-10-CM | POA: Diagnosis present

## 2019-02-20 DIAGNOSIS — J449 Chronic obstructive pulmonary disease, unspecified: Secondary | ICD-10-CM | POA: Diagnosis present

## 2019-02-20 DIAGNOSIS — Z87442 Personal history of urinary calculi: Secondary | ICD-10-CM

## 2019-02-20 DIAGNOSIS — Z79899 Other long term (current) drug therapy: Secondary | ICD-10-CM

## 2019-02-20 DIAGNOSIS — Z833 Family history of diabetes mellitus: Secondary | ICD-10-CM

## 2019-02-20 DIAGNOSIS — M47816 Spondylosis without myelopathy or radiculopathy, lumbar region: Secondary | ICD-10-CM | POA: Diagnosis present

## 2019-02-20 DIAGNOSIS — K861 Other chronic pancreatitis: Secondary | ICD-10-CM

## 2019-02-20 DIAGNOSIS — Z8616 Personal history of COVID-19: Secondary | ICD-10-CM | POA: Diagnosis present

## 2019-02-20 DIAGNOSIS — Z8249 Family history of ischemic heart disease and other diseases of the circulatory system: Secondary | ICD-10-CM

## 2019-02-20 DIAGNOSIS — Z825 Family history of asthma and other chronic lower respiratory diseases: Secondary | ICD-10-CM

## 2019-02-20 DIAGNOSIS — D696 Thrombocytopenia, unspecified: Secondary | ICD-10-CM | POA: Diagnosis present

## 2019-02-20 DIAGNOSIS — K7469 Other cirrhosis of liver: Secondary | ICD-10-CM | POA: Diagnosis present

## 2019-02-20 DIAGNOSIS — Z955 Presence of coronary angioplasty implant and graft: Secondary | ICD-10-CM

## 2019-02-20 DIAGNOSIS — I4819 Other persistent atrial fibrillation: Secondary | ICD-10-CM | POA: Diagnosis present

## 2019-02-20 DIAGNOSIS — K8500 Idiopathic acute pancreatitis without necrosis or infection: Secondary | ICD-10-CM

## 2019-02-20 DIAGNOSIS — Z87891 Personal history of nicotine dependence: Secondary | ICD-10-CM

## 2019-02-20 HISTORY — DX: Acute pancreatitis without necrosis or infection, unspecified: K85.90

## 2019-02-20 HISTORY — DX: Diverticulitis of intestine, part unspecified, without perforation or abscess without bleeding: K57.92

## 2019-02-20 HISTORY — DX: Chronic kidney disease, stage 3 unspecified: N18.30

## 2019-02-20 HISTORY — DX: Personal history of COVID-19: Z86.16

## 2019-02-20 LAB — CBC
HCT: 44.3 % (ref 39.0–52.0)
Hemoglobin: 14.4 g/dL (ref 13.0–17.0)
MCH: 28.2 pg (ref 26.0–34.0)
MCHC: 32.5 g/dL (ref 30.0–36.0)
MCV: 86.9 fL (ref 80.0–100.0)
Platelets: 68 10*3/uL — ABNORMAL LOW (ref 150–400)
RBC: 5.1 MIL/uL (ref 4.22–5.81)
RDW: 13.9 % (ref 11.5–15.5)
WBC: 4.6 10*3/uL (ref 4.0–10.5)
nRBC: 0 % (ref 0.0–0.2)

## 2019-02-20 LAB — HEMOGLOBIN A1C
Hgb A1c MFr Bld: 8 % — ABNORMAL HIGH (ref 4.8–5.6)
Mean Plasma Glucose: 182.9 mg/dL

## 2019-02-20 LAB — COMPREHENSIVE METABOLIC PANEL
ALT: 16 U/L (ref 0–44)
AST: 13 U/L — ABNORMAL LOW (ref 15–41)
Albumin: 4.3 g/dL (ref 3.5–5.0)
Alkaline Phosphatase: 45 U/L (ref 38–126)
Anion gap: 12 (ref 5–15)
BUN: 19 mg/dL (ref 8–23)
CO2: 25 mmol/L (ref 22–32)
Calcium: 9.3 mg/dL (ref 8.9–10.3)
Chloride: 99 mmol/L (ref 98–111)
Creatinine, Ser: 1.66 mg/dL — ABNORMAL HIGH (ref 0.61–1.24)
GFR calc Af Amer: 49 mL/min — ABNORMAL LOW (ref >60)
GFR calc non Af Amer: 42 mL/min — ABNORMAL LOW (ref >60)
Glucose, Bld: 206 mg/dL — ABNORMAL HIGH (ref 70–99)
Potassium: 4.6 mmol/L (ref 3.5–5.1)
Sodium: 136 mmol/L (ref 135–145)
Total Bilirubin: 1.2 mg/dL (ref 0.3–1.2)
Total Protein: 7.5 g/dL (ref 6.5–8.1)

## 2019-02-20 LAB — URINALYSIS, ROUTINE W REFLEX MICROSCOPIC
Bilirubin Urine: NEGATIVE
Glucose, UA: NEGATIVE mg/dL
Hgb urine dipstick: NEGATIVE
Ketones, ur: NEGATIVE mg/dL
Leukocytes,Ua: NEGATIVE
Nitrite: NEGATIVE
Protein, ur: NEGATIVE mg/dL
Specific Gravity, Urine: 1.01 (ref 1.005–1.030)
pH: 5 (ref 5.0–8.0)

## 2019-02-20 LAB — LIPID PANEL
Cholesterol: 113 mg/dL (ref 0–200)
HDL: 20 mg/dL — ABNORMAL LOW (ref >40)
LDL Cholesterol: 38 mg/dL (ref 0–99)
Total CHOL/HDL Ratio: 5.7 RATIO
Triglycerides: 276 mg/dL — ABNORMAL HIGH (ref <150)
VLDL: 55 mg/dL — ABNORMAL HIGH (ref 0–40)

## 2019-02-20 LAB — AMMONIA: Ammonia: <9 umol/L — ABNORMAL LOW (ref 9–35)

## 2019-02-20 LAB — SARS CORONAVIRUS 2 (TAT 6-24 HRS): SARS Coronavirus 2: POSITIVE — AB

## 2019-02-20 LAB — LIPASE, BLOOD: Lipase: 212 U/L — ABNORMAL HIGH (ref 11–51)

## 2019-02-20 LAB — GLUCOSE, CAPILLARY: Glucose-Capillary: 131 mg/dL — ABNORMAL HIGH (ref 70–99)

## 2019-02-20 MED ORDER — ALBUTEROL SULFATE HFA 108 (90 BASE) MCG/ACT IN AERS
2.0000 | INHALATION_SPRAY | RESPIRATORY_TRACT | Status: DC | PRN
  Filled 2019-02-20: qty 6.7

## 2019-02-20 MED ORDER — ONDANSETRON HCL 4 MG PO TABS: 4.0000 mg | ORAL_TABLET | Freq: Four times a day (QID) | ORAL | Status: DC | PRN

## 2019-02-20 MED ORDER — ACETAMINOPHEN 325 MG PO TABS: 650.0000 mg | ORAL_TABLET | Freq: Four times a day (QID) | ORAL | Status: DC | PRN

## 2019-02-20 MED ORDER — SODIUM CHLORIDE 0.9 % IV BOLUS
500.0000 mL | Freq: Once | INTRAVENOUS | Status: AC
  Administered 2019-02-20: 11:00:00 500 mL via INTRAVENOUS

## 2019-02-20 MED ORDER — TAMSULOSIN HCL 0.4 MG PO CAPS
0.4000 mg | ORAL_CAPSULE | Freq: Every day | ORAL | Status: DC
  Administered 2019-02-21 – 2019-02-22 (×2): 0.4 mg via ORAL
  Filled 2019-02-20 (×2): qty 1

## 2019-02-20 MED ORDER — SENNOSIDES-DOCUSATE SODIUM 8.6-50 MG PO TABS: 1.0000 | ORAL_TABLET | Freq: Every evening | ORAL | Status: DC | PRN

## 2019-02-20 MED ORDER — PANTOPRAZOLE SODIUM 40 MG PO TBEC
40.0000 mg | DELAYED_RELEASE_TABLET | Freq: Two times a day (BID) | ORAL | Status: DC
  Administered 2019-02-20 – 2019-02-22 (×4): 40 mg via ORAL
  Filled 2019-02-20 (×4): qty 1

## 2019-02-20 MED ORDER — APIXABAN 5 MG PO TABS
5.0000 mg | ORAL_TABLET | Freq: Two times a day (BID) | ORAL | Status: DC
  Administered 2019-02-20 – 2019-02-22 (×4): 5 mg via ORAL
  Filled 2019-02-20: qty 2
  Filled 2019-02-20 (×3): qty 1

## 2019-02-20 MED ORDER — MORPHINE SULFATE (PF) 4 MG/ML IV SOLN
4.0000 mg | Freq: Once | INTRAVENOUS | Status: AC
  Administered 2019-02-20: 13:00:00 4 mg via INTRAVENOUS
  Filled 2019-02-20: qty 1

## 2019-02-20 MED ORDER — INSULIN ASPART 100 UNIT/ML ~~LOC~~ SOLN
0.0000 [IU] | Freq: Three times a day (TID) | SUBCUTANEOUS | Status: DC
  Administered 2019-02-21 (×3): 1 [IU] via SUBCUTANEOUS
  Administered 2019-02-22 (×2): 2 [IU] via SUBCUTANEOUS

## 2019-02-20 MED ORDER — FUROSEMIDE 40 MG PO TABS
40.0000 mg | ORAL_TABLET | Freq: Every day | ORAL | Status: DC
  Administered 2019-02-21 – 2019-02-22 (×2): 40 mg via ORAL
  Filled 2019-02-20 (×2): qty 1

## 2019-02-20 MED ORDER — OXYCODONE-ACETAMINOPHEN 5-325 MG PO TABS
2.0000 | ORAL_TABLET | ORAL | Status: DC | PRN
  Administered 2019-02-20 – 2019-02-22 (×10): 2 via ORAL
  Filled 2019-02-20 (×10): qty 2

## 2019-02-20 MED ORDER — ACETAMINOPHEN 650 MG RE SUPP: 650.0000 mg | Freq: Four times a day (QID) | RECTAL | Status: DC | PRN

## 2019-02-20 MED ORDER — HYDROMORPHONE HCL 1 MG/ML IJ SOLN
0.5000 mg | INTRAMUSCULAR | Status: DC | PRN
  Administered 2019-02-20 – 2019-02-22 (×10): 0.5 mg via INTRAVENOUS
  Filled 2019-02-20: qty 1
  Filled 2019-02-20 (×3): qty 0.5
  Filled 2019-02-20: qty 1
  Filled 2019-02-20 (×5): qty 0.5

## 2019-02-20 MED ORDER — TIZANIDINE HCL 4 MG PO TABS
4.0000 mg | ORAL_TABLET | Freq: Two times a day (BID) | ORAL | Status: DC
  Administered 2019-02-20 – 2019-02-22 (×4): 4 mg via ORAL
  Filled 2019-02-20 (×3): qty 1

## 2019-02-20 MED ORDER — TRAZODONE HCL 100 MG PO TABS
100.0000 mg | ORAL_TABLET | Freq: Every day | ORAL | Status: DC
  Administered 2019-02-20 – 2019-02-21 (×2): 100 mg via ORAL
  Filled 2019-02-20 (×2): qty 1

## 2019-02-20 MED ORDER — METOPROLOL SUCCINATE ER 50 MG PO TB24
50.0000 mg | ORAL_TABLET | Freq: Two times a day (BID) | ORAL | Status: DC
  Administered 2019-02-20 – 2019-02-22 (×4): 50 mg via ORAL
  Filled 2019-02-20 (×4): qty 1

## 2019-02-20 MED ORDER — ESCITALOPRAM OXALATE 10 MG PO TABS
10.0000 mg | ORAL_TABLET | Freq: Every day | ORAL | Status: DC
  Administered 2019-02-21 – 2019-02-22 (×2): 10 mg via ORAL
  Filled 2019-02-20 (×2): qty 1

## 2019-02-20 MED ORDER — SODIUM CHLORIDE 0.9 % IV SOLN: INTRAVENOUS | Status: DC

## 2019-02-20 MED ORDER — ONDANSETRON HCL 4 MG/2ML IJ SOLN: 4.0000 mg | Freq: Four times a day (QID) | INTRAMUSCULAR | Status: DC | PRN

## 2019-02-20 MED ORDER — IOHEXOL 300 MG/ML  SOLN
75.0000 mL | Freq: Once | INTRAMUSCULAR | Status: AC | PRN
  Administered 2019-02-20: 75 mL via INTRAVENOUS

## 2019-02-20 MED ORDER — GABAPENTIN 300 MG PO CAPS
300.0000 mg | ORAL_CAPSULE | Freq: Two times a day (BID) | ORAL | Status: DC
  Administered 2019-02-20 – 2019-02-22 (×4): 300 mg via ORAL
  Filled 2019-02-20 (×4): qty 1

## 2019-02-20 MED ORDER — INSULIN ASPART 100 UNIT/ML ~~LOC~~ SOLN: 0.0000 [IU] | Freq: Every day | SUBCUTANEOUS | Status: DC

## 2019-02-20 MED ORDER — MORPHINE SULFATE (PF) 4 MG/ML IV SOLN
4.0000 mg | Freq: Once | INTRAVENOUS | Status: AC
  Administered 2019-02-20: 4 mg via INTRAVENOUS
  Filled 2019-02-20: qty 1

## 2019-02-20 MED ORDER — MELATONIN 5 MG PO TABS
10.0000 mg | ORAL_TABLET | Freq: Every day | ORAL | Status: DC
  Administered 2019-02-20 – 2019-02-21 (×2): 10 mg via ORAL
  Filled 2019-02-20 (×2): qty 2

## 2019-02-20 MED ORDER — SODIUM CHLORIDE (PF) 0.9 % IJ SOLN
INTRAMUSCULAR | Status: AC
  Filled 2019-02-20: qty 50

## 2019-02-20 MED ORDER — SODIUM CHLORIDE 0.9% FLUSH
3.0000 mL | Freq: Once | INTRAVENOUS | Status: AC
  Administered 2019-02-20: 11:00:00 3 mL via INTRAVENOUS

## 2019-02-20 NOTE — Telephone Encounter (Signed)
Patient is still currently in the ER-will check back on patient at a later date/time;

## 2019-02-20 NOTE — Telephone Encounter (Signed)
Agree with evaluation at [facility] ED. He should get blood work/CT/pain control. Then depending upon that, we can make further decisions.  Bre, can you please follow-up later on today regarding the above ED visit and let me know.  I will alert the inpatient service if he needs to come in. Thx Rg

## 2019-02-20 NOTE — Telephone Encounter (Signed)
Patient's wife just called to inform us that the patient is currently at [facility]

## 2019-02-20 NOTE — ED Triage Notes (Signed)
Lower abd and back pain for a few days. Went to PCP yesterday and was told to come to he ED. Nausea this am.

## 2019-02-20 NOTE — Telephone Encounter (Signed)
Called and spoke with patient-patient reports he is headed to the [facility] ER because he has been having pain for the last 2-3 days and it is "really bad and I can't take it any longer"; patient reports abd pain and not being able to eat since Saturday;  Patient advised information would be sent to MD;

## 2019-02-20 NOTE — ED Notes (Signed)
[facility] called [facility] and they faxed paperwork showing that patient had a rapid test come back positive on January 3rd.  RN called bed placement to tell them.

## 2019-02-20 NOTE — Telephone Encounter (Signed)
Pt reported that he is experiencing diverticulitis or pancreatic pain.  Please advise.

## 2019-02-20 NOTE — ED Provider Notes (Addendum)
[facility] DEPT Provider Note   CSN: [id number] Arrival date & time: 02/20/19  8875     History Chief Complaint  Patient presents with  . Abdominal Pain  . Back Pain    Steve Anthony is a 69 y.o. male.  HPI 69 yo male complaining of abdominal pain began Sunday, severe gnawing pain which patient associates with previous diverticulitis or pancreatitis.  States he has had both before.  Patient states he has not eaten since Sunday but had one thing of gatorade yesterday, but made pain worse. No fever, denies alcohol quit 1989, last pancreatitis January 2020 Denies hematemesis or blood in stool. Patient urinating without difficulty.  Took nausea pill at 0700, and pain oxycodone at 0430- states he takes them every 4 hours for back pain.  PMD Hargill Dr. Bea Graff, pain clinic in [facility] rx for oxycodone.      Past Medical History:  Diagnosis Date  . Arthritis   . CAD (coronary artery disease)    a. S/P PCI mid LAD (vision stent) 08/31/2004;  b. repeat cath 6/11 - no progression of CAD. c. Cath 10/2012 given moderate CAD on cardiac CT - no change from prior cath.  . Chest pain 12/16/2016  . COPD (chronic obstructive pulmonary disease) ([place])   . COVID-19 virus infection   . Diverticulitis   . DJD (degenerative joint disease) of lumbar spine   . Dyslipidemia   . Gallstones   . GERD (gastroesophageal reflux disease)   . History of kidney stones   . Hypertension   . Hyperthyroidism   . Melanoma ([place])    melanoma removed from neck   . On home oxygen therapy    "2L q hs" (11/08/2012)  . OSA (obstructive sleep apnea)    a. cpap noncompliance- patient reports on 12/10/14- does not use CPAP  . Palpitations    a. 10/2012 - s/p LINQ loop recorder to assess for arrhythmia.   . Pancreatitis   . Paroxysmal A-fib ([place])    a. S/P PVI 2/11 and 9/11  . SVT (supraventricular tachycardia) ([place])    a. Atypical AVNRT of the slow pathway  . Type II diabetes  mellitus [facility])     Patient Active Problem List   Diagnosis Date Noted  . Abnormal CT of the abdomen 05/27/2018  . Recurrent pancreatitis 05/27/2018  . Idiopathic acute pancreatitis 04/14/2018  . Abnormal MRI of abdomen 04/14/2018  . Pancreatic cyst 04/14/2018  . Cirrhosis of liver without ascites ([place]) 04/14/2018  . Chest pain 12/16/2016  . Pure hypercholesterolemia   . S/P coronary artery stent placement   . Long term (current) use of anticoagulants [Z79.01] 06/10/2015  . OA (osteoarthritis) of hip 12/18/2014  . Coronary atherosclerosis 12/31/2009  . Sleep apnea 05/17/2009  . C O P D 05/15/2009  . SHORTNESS OF BREATH 03/19/2009  . Atrial fibrillation ([place]) 11/21/2008  . CHEST PAIN UNSPECIFIED 11/21/2008  . HYPERTRIGLYCERIDEMIA 10/15/2008  . DYSLIPIDEMIA 10/15/2008  . Essential hypertension 10/15/2008  . Unstable angina ([place]) 10/15/2008  . GERD 10/15/2008  . NEPHROLITHIASIS 10/15/2008  . PALPITATIONS 10/15/2008    Past Surgical History:  Procedure Laterality Date  . ATRIAL ABLATION SURGERY  2007   S/P slow pathway ablation for atypical AVNRT   . BIOPSY  07/17/2018   Procedure: BIOPSY;  Surgeon: Rush Landmark Telford Nab., MD;  Location: [facility];  Service: Gastroenterology;;  . [facility]  ~ 2008  . COLON RESECTION  02/2013   . COLONOSCOPY  12/25/2007  Colonic polyp status post polypectomy. Internal hemorrhoids.   . CORONARY ANGIOPLASTY WITH STENT PLACEMENT  08/30/2004   Vision; to LAD   . CYSTOSCOPY W/ STONE MANIPULATION  ~ 1998   "once" (11/08/2012)  . EP Study  2008   Negative EP study in [place]  . ESOPHAGOGASTRODUODENOSCOPY (EGD) WITH PROPOFOL N/A 07/17/2018   Procedure: ESOPHAGOGASTRODUODENOSCOPY (EGD) WITH PROPOFOL;  Surgeon: Rush Landmark Telford Nab., MD;  Location: [facility];  Service: Gastroenterology;  Laterality: N/A;  . FRACTURE SURGERY    . LEFT HEART CATHETERIZATION WITH CORONARY ANGIOGRAM N/A 11/10/2012   Procedure: LEFT HEART CATHETERIZATION  WITH CORONARY ANGIOGRAM;  Surgeon: Burnell Blanks, MD;  Location: [facility] CATH LAB;  Service: Cardiovascular;  Laterality: N/A;  . LOOP RECORDER IMPLANT N/A 11/10/2012   Medtronic LinQ implanted by Dr Rayann Heman for afib management  . [place] SURGERY  2000; 2002  . POSTERIOR LUMBAR FUSION  2004  . PROSTATE SURGERY  ~ 2009  . PVI/ CTI ablation  02/25/2009 and 09/2009   S/P afib/ CTI ablation  . TIBIA FRACTURE SURGERY Right 1990   "broke in 3 places; had rod put in" (11/08/2012)  . TIBIA HARDWARE REMOVAL Right ~ 1991  . TONSILLECTOMY  1965  . TOTAL HIP ARTHROPLASTY Right 12/18/2014   Procedure: RIGHT TOTAL HIP ARTHROPLASTY ANTERIOR APPROACH;  Surgeon: Gaynelle Arabian, MD;  Location: [facility];  Service: Orthopedics;  Laterality: Right;  . UPPER ESOPHAGEAL ENDOSCOPIC ULTRASOUND (EUS) N/A 07/17/2018   Procedure: UPPER ESOPHAGEAL ENDOSCOPIC ULTRASOUND (EUS);  Surgeon: Irving Copas., MD;  Location: [facility];  Service: Gastroenterology;  Laterality: N/A;       Family History  Problem Relation Age of Onset  . Emphysema Mother   . Heart attack Father        CVA  . Hypertension Father   . Diabetes Father   . Heart disease Father        MI  . Emphysema Maternal Grandfather        Smoker  . Heart disease Paternal Grandfather   . Colon cancer Neg Hx   . Esophageal cancer Neg Hx   . Inflammatory bowel disease Neg Hx   . Liver disease Neg Hx   . Pancreatic cancer Neg Hx   . Rectal cancer Neg Hx   . Stomach cancer Neg Hx     Social History   Tobacco Use  . Smoking status: Former Smoker    Packs/day: 3.00    Years: 38.00    Pack years: 114.00    Types: Cigarettes    Quit date: 03/25/2004    Years since quitting: 14.9  . Smokeless tobacco: Former Systems developer    Types: Chew    Quit date: 04/25/2004  Substance Use Topics  . Alcohol use: Not Currently    Comment:  "quit drinking in 1989"  . Drug use: No    Home Medications Prior to Admission medications   Medication Sig Start  Date End Date Taking? Authorizing Provider  acetaminophen (TYLENOL) 650 MG CR tablet Take 1,300 mg by mouth every 8 (eight) hours as needed for pain.    [provider]  atorvastatin (LIPITOR) 40 MG tablet Take 40 mg by mouth daily.     [provider]  Cyanocobalamin (VITAMIN B-12 IJ) Inject 1,000 mcg as directed every 30 (thirty) days.    [provider]  Docusate Sodium (COLACE PO) Take 1 tablet by mouth daily.    [provider]  ELIQUIS 5 MG TABS tablet TAKE 1 TABLET  BY MOUTH TWICE A DAY 12/25/18   Allred, Jeneen Rinks, MD  escitalopram (LEXAPRO) 10 MG tablet Take 10 mg by mouth daily. 05/30/17   [provider]  fenofibrate 160 MG tablet Take 160 mg by mouth daily.      [provider]  ferrous sulfate 325 (65 FE) MG tablet Take 325 mg by mouth as directed. Take 1 tablet 2 times a week    [provider]  furosemide (LASIX) 20 MG tablet Take 40 mg by mouth daily.  03/15/17   [provider]  gabapentin (NEURONTIN) 300 MG capsule Take 300 mg by mouth 3 (three) times daily.    [provider]  Melatonin 10 MG TABS Take 10 mg by mouth at bedtime.     [provider]  metFORMIN (GLUCOPHAGE) 500 MG tablet Take 500 mg by mouth 2 (two) times daily.    [provider]  metoprolol succinate (TOPROL-XL) 50 MG 24 hr tablet TAKE 1 TABLET BY MOUTH TWICE A DAY Patient taking differently: Take 50 mg by mouth daily.     Thompson Grayer, MD  naloxone [facility]) 4 MG/0.1ML LIQD nasal spray kit Place 1 spray into the nose daily as needed (opioid overdose).    [provider]  nitroGLYCERIN (NITROSTAT) 0.4 MG SL tablet Place 1 tablet (0.4 mg total) under the tongue every 5 (five) minutes x 3 doses as needed for chest pain. Patient not taking: Reported on 02/01/2019 12/17/16   Barrett, Evelene Croon, PA-C  OVER THE COUNTER MEDICATION Apply 1 application topically daily as needed (rash). OTC fanny cream    [provider]  oxyCODONE-acetaminophen (PERCOCET) 10-325 MG tablet Take 1 tablet by mouth every 4 (four) hours as needed for pain.     [provider]  OXYGEN Inhale 2 L into the lungs as needed.    [provider]  pantoprazole (PROTONIX) 40 MG tablet TAKE 1 TABLET BY MOUTH TWICE A DAY 10/09/18   Mansouraty, Telford Nab., MD  promethazine (PHENERGAN) 25 MG tablet Take 1 tablet (25 mg total) by mouth every 8 (eight) hours as needed for nausea or vomiting. 09/25/18   Jackquline Denmark, MD  spironolactone (ALDACTONE) 25 MG tablet Take 25 mg by mouth daily. 10/26/14   [provider]  Tamsulosin HCl (FLOMAX) 0.4 MG CAPS Take 0.4 mg by mouth daily.      [provider]  tiZANidine (ZANAFLEX) 4 MG tablet Take 4 mg by mouth at bedtime. 03/01/18   [provider]  traZODone (DESYREL) 50 MG tablet Take 100 mg by mouth at bedtime.  08/17/17   [provider]    Allergies    Penicillins  Review of Systems   Review of Systems  Constitutional: Positive for appetite change. Negative for fever.  HENT: Negative.   Eyes: Negative.   Respiratory: Negative.   Cardiovascular: Negative.   Gastrointestinal: Positive for abdominal pain and nausea.  Endocrine: Negative.   Genitourinary: Negative.   Musculoskeletal: Positive for back pain.  Skin: Negative.   Allergic/Immunologic: Negative.   Neurological: Negative.   Hematological: Negative.   Psychiatric/Behavioral: Negative.   All other systems reviewed and are negative.   Physical Exam Updated Vital Signs BP 116/73 (BP Location: Left Arm)   Pulse 81   Temp 97.6 F (36.4 C) (Oral)   Resp 17   Ht 1.829 m (6')   Wt 108.9 kg   SpO2 95%   BMI 32.55 kg/m   Physical Exam  ED Results / Procedures /  Treatments   Labs (all labs ordered are listed, but only abnormal results are displayed) Labs Reviewed  LIPASE, BLOOD  COMPREHENSIVE METABOLIC PANEL  CBC  URINALYSIS, ROUTINE W REFLEX MICROSCOPIC     EKG None  Radiology CT ABDOMEN PELVIS W CONTRAST  Result Date: 02/20/2019 CLINICAL DATA:  Nausea and abdominal pain. EXAM: CT ABDOMEN AND PELVIS WITH CONTRAST TECHNIQUE: Multidetector CT imaging of the abdomen and pelvis was performed using the standard protocol following bolus administration of intravenous contrast. CONTRAST:  77m OMNIPAQUE IOHEXOL 300 MG/ML  SOLN COMPARISON:  Multiple prior CT scans from 2020. The most recent is November 09, 2018. FINDINGS: Lower chest: The lung bases are clear of an acute process. Small left basilar pulmonary nodules are stable. There is also a calcified granuloma at the right lung base. The heart is normal in size. No pericardial effusion. Stable age advanced coronary artery calcifications. Hepatobiliary: Stable cirrhotic changes involving the liver with mild liver contour abnormality, dilated hepatic fissures, increased caudate to right lobe ratio, portal venous hypertension, portal venous collaterals and extensive gastric varices. Diffuse hepatic steatosis again demonstrated. No focal hepatic lesions. The portal and hepatic veins are patent. The gallbladder is surgically absent. No common bile duct dilatation. Pancreas: CT findings of acute pancreatitis with pancreatic and peripancreatic inflammatory changes. I do not see any findings for pancreatic necrosis. Enlarging well-formed pseudocyst in the pancreatic tail extending to the splenic hilum. This measures 9.5 x 8.0 cm and previously measured 7.5 x 6.5 cm. Spleen: Stable splenomegaly. No splenic lesions or splenic infarction. Adrenals/Urinary Tract: The adrenal glands are unremarkable and stable. Stable bilateral renal cysts. No worrisome renal lesions. No collecting system abnormalities are identified. The bladder is unremarkable. Stomach/Bowel: The stomach, duodenum, small bowel and colon are grossly normal without oral contrast. Stable duodenal diverticuli. No acute inflammatory changes, mass lesions or  obstructive findings. The terminal ileum and appendix are normal. Vascular/Lymphatic: Stable vascular calcifications. No aneurysm or dissection. The major venous structures are patent. The the splenic vein is occluded near the pancreatic pseudocyst but is reconstituted near the portal splenic confluence. The portal vein is patent. No mesenteric or retroperitoneal lymphadenopathy. No pelvic adenopathy. Reproductive: The prostate gland and seminal vesicles are unremarkable. Other: No pelvic mass or free pelvic fluid collections. No inguinal mass or adenopathy or hernia. Musculoskeletal: Moderate artifact from a right hip prosthesis. Stable L5-S1 fusion hardware. No significant or acute bony findings. IMPRESSION: 1. CT findings consistent with acute pancreatitis. No findings for pancreatic necrosis. 2. Enlarging pancreatic pseudocyst. 3. Stable cirrhotic changes involving the liver with portal venous hypertension, portal venous collaterals and splenomegaly. No focal hepatic lesions. 4. Stable small left basilar pulmonary nodules. Electronically Signed   By: PMarijo SanesM.D.   On: 02/20/2019 12:50    Procedures Procedures (including critical care time)  Medications Ordered in ED Medications  sodium chloride flush (NS) 0.9 % injection 3 mL (has no administration in time range)    ED Course  I have reviewed the triage vital signs and the nursing notes.  Pertinent labs & imaging results that were available during my care of the patient were reviewed by me and considered in my medical decision making (see chart for details).  Clinical Course as of Feb 19 1301  Tue Feb 20, 2019  1210 Noted lipase elevated  Lipase(!): 212 [DR]  1210 Creatinine(!): 1.66 [DR]  1210 Calcium: 9.3 [DR]  1211 Stable from prior with last 2.22   [DR]    Clinical Course User Index [  DR] Pattricia Boss, MD   MDM Rules/Calculators/A&P                     69 year old man with chronic back pain, history of diverticulitis,  pancreatitis presents today with diffuse abdominal pain.  He has been able unable to tolerate p.o. and worsens with any p.o. intake.  Plan admission for IV fluids and pain control.  Discussed with Dr. [place] and will see for admission Final Clinical Impression(s) / ED Diagnoses Final diagnoses:  Chronic pancreatitis, unspecified pancreatitis type [facility])    Rx / DC Orders ED Discharge Orders    None       Pattricia Boss, MD 02/20/19 1307    Pattricia Boss, MD 02/20/19 1323

## 2019-02-20 NOTE — H&P (Signed)
History and Physical    Steve Anthony:[id number] DOB: July 16, 1950 DOA: 02/20/2019  PCP: [name], MD  Patient coming from: Home  I have personally briefly reviewed patient's old medical records in [place]  Chief Complaint: Nausea/vomiting, abdominal pain  HPI: Steve Anthony is a 69 y.o. male with medical history significant of chronic pancreatitis, cirrhosis without ascites, CKD stage IIIa, atrial fibrillation on anticoagulation with Eliquis, essential hypertension, type 2 diabetes mellitus, HLD, history of Covid-19 infection who presents to the ED with complaints of nausea and vomiting with associated abdominal pain over the past few days.  Patient reports onset initially about a week ago with severe worsening Sunday night.  History of alcohol abuse, quit many years ago and status post cholecystectomy.  Has not been able to keep much solids or liquids down during this timeframe.  No other specific complaints at this time.  Denies headache, no fever/chills/night sweats, no diarrhea, no chest pain, no palpitations, no shortness of breath, no cough/congestion.   ED Course: Temperature 97.6, HR 72, RR 18, BP 116/78, SPO2 95% on room air.  WBC count 4.6, hemoglobin 14.4, platelets 68.  Sodium 136, potassium 4.6, chloride 99, CO2 25, BUN 19, creatinine 1.66, glucose 206.  Lipase 212.  Total bilirubin 1.2, AST 13, ALT 16.  Urinalysis unrevealing.  CT abdomen/pelvis with findings significant for acute pancreatitis without necrosis, gallbladder absent without common bile duct dilation.  Patient was given IV fluid hydration, morphine IV.  ED physician referred patient for admission for further evaluation and treatment of acute pancreatitis.  Review of Systems: As per HPI otherwise 10 point review of systems negative.    Past Medical History:  Diagnosis Date  . Arthritis   . CAD (coronary artery disease)    a. S/P PCI mid LAD (vision stent) 08/31/2004;  b. repeat cath 6/11 - no  progression of CAD. c. Cath 10/2012 given moderate CAD on cardiac CT - no change from prior cath.  . Chest pain 12/16/2016  . COPD (chronic obstructive pulmonary disease) ([place])   . COVID-19 virus infection   . Diverticulitis   . DJD (degenerative joint disease) of lumbar spine   . Dyslipidemia   . Gallstones   . GERD (gastroesophageal reflux disease)   . History of kidney stones   . Hypertension   . Hyperthyroidism   . Melanoma ([place])    melanoma removed from neck   . On home oxygen therapy    "2L q hs" (11/08/2012)  . OSA (obstructive sleep apnea)    a. cpap noncompliance- patient reports on 12/10/14- does not use CPAP  . Palpitations    a. 10/2012 - s/p LINQ loop recorder to assess for arrhythmia.   . Pancreatitis   . Paroxysmal A-fib ([place])    a. S/P PVI 2/11 and 9/11  . SVT (supraventricular tachycardia) ([place])    a. Atypical AVNRT of the slow pathway  . Type II diabetes mellitus ([place])     Past Surgical History:  Procedure Laterality Date  . ATRIAL ABLATION SURGERY  2007   S/P slow pathway ablation for atypical AVNRT   . BIOPSY  07/17/2018   Procedure: BIOPSY;  Surgeon: Rush Landmark Telford Nab., MD;  Location: [place];  Service: Gastroenterology;;  . [facility]  ~ 2008  . COLON RESECTION  02/2013   . COLONOSCOPY  12/25/2007   Colonic polyp status post polypectomy. Internal hemorrhoids.   . CORONARY ANGIOPLASTY WITH STENT PLACEMENT  08/30/2004   Vision; to LAD   .  CYSTOSCOPY W/ STONE MANIPULATION  ~ 1998   "once" (11/08/2012)  . EP Study  2008   Negative EP study in [place]  . ESOPHAGOGASTRODUODENOSCOPY (EGD) WITH PROPOFOL N/A 07/17/2018   Procedure: ESOPHAGOGASTRODUODENOSCOPY (EGD) WITH PROPOFOL;  Surgeon: Rush Landmark Telford Nab., MD;  Location: [place];  Service: Gastroenterology;  Laterality: N/A;  . FRACTURE SURGERY    . LEFT HEART CATHETERIZATION WITH CORONARY ANGIOGRAM N/A 11/10/2012   Procedure: LEFT HEART CATHETERIZATION WITH CORONARY ANGIOGRAM;   Surgeon: Burnell Blanks, MD;  Location: [facility] CATH LAB;  Service: Cardiovascular;  Laterality: N/A;  . LOOP RECORDER IMPLANT N/A 11/10/2012   Medtronic LinQ implanted by Dr Rayann Heman for afib management  . [place] SURGERY  2000; 2002  . POSTERIOR LUMBAR FUSION  2004  . PROSTATE SURGERY  ~ 2009  . PVI/ CTI ablation  02/25/2009 and 09/2009   S/P afib/ CTI ablation  . TIBIA FRACTURE SURGERY Right 1990   "broke in 3 places; had rod put in" (11/08/2012)  . TIBIA HARDWARE REMOVAL Right ~ 1991  . TONSILLECTOMY  1965  . TOTAL HIP ARTHROPLASTY Right 12/18/2014   Procedure: RIGHT TOTAL HIP ARTHROPLASTY ANTERIOR APPROACH;  Surgeon: Gaynelle Arabian, MD;  Location: [facility];  Service: Orthopedics;  Laterality: Right;  . UPPER ESOPHAGEAL ENDOSCOPIC ULTRASOUND (EUS) N/A 07/17/2018   Procedure: UPPER ESOPHAGEAL ENDOSCOPIC ULTRASOUND (EUS);  Surgeon: Irving Copas., MD;  Location: [place];  Service: Gastroenterology;  Laterality: N/A;     reports that he quit smoking about 14 years ago. His smoking use included cigarettes. He has a 114.00 pack-year smoking history. He quit smokeless tobacco use about 14 years ago.  His smokeless tobacco use included chew. He reports previous alcohol use. He reports that he does not use drugs.  Allergies  Allergen Reactions  . Penicillins Hives and Shortness Of Breath    Did it involve swelling of the face/tongue/throat, SOB, or low BP? Yes Did it involve sudden or severe rash/hives, skin peeling, or any reaction on the inside of your mouth or nose? No Did you need to seek medical attention at a hospital or doctor's office? Yes When did it last happen?40 + years If all above answers are "NO", may proceed with cephalosporin use.      Family History  Problem Relation Age of Onset  . Emphysema Mother   . Heart attack Father        CVA  . Hypertension Father   . Diabetes Father   . Heart disease Father        MI  . Emphysema Maternal Grandfather         Smoker  . Heart disease Paternal Grandfather   . Colon cancer Neg Hx   . Esophageal cancer Neg Hx   . Inflammatory bowel disease Neg Hx   . Liver disease Neg Hx   . Pancreatic cancer Neg Hx   . Rectal cancer Neg Hx   . Stomach cancer Neg Hx     Family history reviewed and not pertinent   Prior to Admission medications   Medication Sig Start Date End Date Taking? Authorizing Provider  acetaminophen (TYLENOL) 325 MG tablet Take by mouth every 6 (six) hours as needed for headache.   Yes [provider]  albuterol (VENTOLIN HFA) 108 (90 Base) MCG/ACT inhaler Inhale 2 puffs into the lungs every 4 (four) hours as needed. 01/28/19  Yes [provider]  Cyanocobalamin (VITAMIN B-12 IJ) Inject 1,000 mcg as directed every 30 (thirty) days.  Yes [provider]  docusate sodium (COLACE) 100 MG capsule Take 100 mg by mouth See admin instructions. Three times a week   Yes [provider]  ELIQUIS 5 MG TABS tablet TAKE 1 TABLET BY MOUTH TWICE A DAY Patient taking differently: Take 5 mg by mouth 2 (two) times daily.  12/25/18  Yes Allred, Jeneen Rinks, MD  escitalopram (LEXAPRO) 10 MG tablet Take 10 mg by mouth daily. 05/30/17  Yes [provider]  fenofibrate 160 MG tablet Take 160 mg by mouth daily.     Yes [provider]  ferrous sulfate 325 (65 FE) MG tablet Take 325 mg by mouth See admin instructions. Take 1 tablet 2 times a week   Yes [provider]  furosemide (LASIX) 20 MG tablet Take 40 mg by mouth daily.  03/15/17  Yes [provider]  gabapentin (NEURONTIN) 300 MG capsule Take 300 mg by mouth 2 (two) times daily.    Yes [provider]  Melatonin 10 MG TABS Take 10 mg by mouth at bedtime.    Yes [provider]  metFORMIN (GLUCOPHAGE) 500 MG tablet Take 500 mg by mouth 2 (two) times daily.   Yes [provider]  metoprolol succinate (TOPROL-XL) 50 MG 24 hr tablet TAKE 1 TABLET BY MOUTH TWICE A  DAY Patient taking differently: Take 50 mg by mouth 2 (two) times daily.    Yes Thompson Grayer, MD  naloxone [facility]) 4 MG/0.1ML LIQD nasal spray kit Place 1 spray into the nose daily as needed (opioid overdose).   Yes [provider]  nitroGLYCERIN (NITROSTAT) 0.4 MG SL tablet Place 1 tablet (0.4 mg total) under the tongue every 5 (five) minutes x 3 doses as needed for chest pain. 12/17/16  Yes Barrett, Evelene Croon, PA-C  OVER THE COUNTER MEDICATION Apply 1 application topically daily as needed (rash). OTC fanny cream   Yes [provider]  oxyCODONE-acetaminophen (PERCOCET) 10-325 MG tablet Take 1 tablet by mouth every 4 (four) hours as needed for pain.    Yes [provider]  OXYGEN Inhale 2 L into the lungs daily as needed (shortness of breath).    Yes [provider]  pantoprazole (PROTONIX) 40 MG tablet TAKE 1 TABLET BY MOUTH TWICE A DAY Patient taking differently: Take 40 mg by mouth 2 (two) times daily.  10/09/18  Yes Mansouraty, Telford Nab., MD  spironolactone (ALDACTONE) 25 MG tablet Take 25 mg by mouth daily. 10/26/14  Yes [provider]  Tamsulosin HCl (FLOMAX) 0.4 MG CAPS Take 0.4 mg by mouth daily.     Yes [provider]  tiZANidine (ZANAFLEX) 4 MG tablet Take 4 mg by mouth 2 (two) times daily. In the evening 03/01/18  Yes [provider]  traZODone (DESYREL) 50 MG tablet Take 100 mg by mouth at bedtime.  08/17/17  Yes [provider]  promethazine (PHENERGAN) 25 MG tablet Take 1 tablet (25 mg total) by mouth every 8 (eight) hours as needed for nausea or vomiting. Patient not taking: Reported on 02/20/2019 09/25/18   Jackquline Denmark, MD    Physical Exam: Vitals:   02/20/19 0942 02/20/19 0943 02/20/19 1204  BP: 116/73  116/78  Pulse: 81  72  Resp: 17  18  Temp: 97.6 F (36.4 C)    TempSrc: Oral    SpO2: 95%  95%  Weight:  108.9 kg   Height:  6' (1.829 m)     Constitutional: NAD, calm, comfortable Eyes: PERRL,  lids  and conjunctivae normal ENMT: Mucous membranes are dry. Posterior pharynx clear of any exudate or lesions.Normal dentition.  Neck: normal, supple, no masses, no thyromegaly Respiratory: clear to auscultation bilaterally, no wheezing, no crackles. Normal respiratory effort. No accessory muscle use.  Oxygenating well on room air Cardiovascular: Regular rate and rhythm, no murmurs / rubs / gallops. No extremity edema. 2+ pedal pulses. No carotid bruits.  Abdomen: Abdomen slightly distended/protuberant, mild global tenderness throughout, no masses palpated. No hepatosplenomegaly. Bowel sounds positive.  Musculoskeletal: no clubbing / cyanosis. No joint deformity upper and lower extremities. Good ROM, no contractures. Normal muscle tone.  Skin: no rashes, lesions, ulcers. No induration Neurologic: CN 2-12 grossly intact. Sensation intact, DTR normal. Strength 5/5 in all 4.  Psychiatric: Normal judgment and insight. Alert and oriented x 3. Normal mood.    Labs on Admission: I have personally reviewed following labs and imaging studies  CBC: Recent Labs  Lab 02/20/19 1044  WBC 4.6  HGB 14.4  HCT 44.3  MCV 86.9  PLT 68*   Basic Metabolic Panel: Recent Labs  Lab 02/20/19 1044  NA 136  K 4.6  CL 99  CO2 25  GLUCOSE 206*  BUN 19  CREATININE 1.66*  CALCIUM 9.3   GFR: Estimated Creatinine Clearance: 55 mL/min (A) (by C-G formula based on SCr of 1.66 mg/dL (H)). Liver Function Tests: Recent Labs  Lab 02/20/19 1044  AST 13*  ALT 16  ALKPHOS 45  BILITOT 1.2  PROT 7.5  ALBUMIN 4.3   Recent Labs  Lab 02/20/19 1044  LIPASE 212*   Recent Labs  Lab 02/20/19 1113  AMMONIA <9*   Coagulation Profile: No results for input(s): INR, PROTIME in the last 168 hours. Cardiac Enzymes: No results for input(s): CKTOTAL, CKMB, CKMBINDEX, TROPONINI in the last 168 hours. BNP (last 3 results) No results for input(s): PROBNP in the last 8760 hours. HbA1C: No results for input(s):  HGBA1C in the last 72 hours. CBG: No results for input(s): GLUCAP in the last 168 hours. Lipid Profile: No results for input(s): CHOL, HDL, LDLCALC, TRIG, CHOLHDL, LDLDIRECT in the last 72 hours. Thyroid Function Tests: No results for input(s): TSH, T4TOTAL, FREET4, T3FREE, THYROIDAB in the last 72 hours. Anemia Panel: No results for input(s): VITAMINB12, FOLATE, FERRITIN, TIBC, IRON, RETICCTPCT in the last 72 hours. Urine analysis:    Component Value Date/Time   COLORURINE YELLOW 02/20/2019 1002   APPEARANCEUR CLEAR 02/20/2019 1002   LABSPEC 1.010 02/20/2019 1002   PHURINE 5.0 02/20/2019 1002   GLUCOSEU NEGATIVE 02/20/2019 1002   [place] 02/20/2019 1002   [place] 02/20/2019 1002   [place] 02/20/2019 1002   PROTEINUR NEGATIVE 02/20/2019 1002   NITRITE NEGATIVE 02/20/2019 [place] 02/20/2019 1002    Radiological Exams on Admission: CT ABDOMEN PELVIS W CONTRAST  Result Date: 02/20/2019 CLINICAL DATA:  Nausea and abdominal pain. EXAM: CT ABDOMEN AND PELVIS WITH CONTRAST TECHNIQUE: Multidetector CT imaging of the abdomen and pelvis was performed using the standard protocol following bolus administration of intravenous contrast. CONTRAST:  33m OMNIPAQUE IOHEXOL 300 MG/ML  SOLN COMPARISON:  Multiple prior CT scans from 2020. The most recent is November 09, 2018. FINDINGS: Lower chest: The lung bases are clear of an acute process. Small left basilar pulmonary nodules are stable. There is also a calcified granuloma at the right lung base. The heart is normal in size. No pericardial effusion. Stable age advanced coronary artery calcifications. Hepatobiliary: Stable cirrhotic changes involving the liver with  mild liver contour abnormality, dilated hepatic fissures, increased caudate to right lobe ratio, portal venous hypertension, portal venous collaterals and extensive gastric varices. Diffuse hepatic steatosis again demonstrated. No focal hepatic  lesions. The portal and hepatic veins are patent. The gallbladder is surgically absent. No common bile duct dilatation. Pancreas: CT findings of acute pancreatitis with pancreatic and peripancreatic inflammatory changes. I do not see any findings for pancreatic necrosis. Enlarging well-formed pseudocyst in the pancreatic tail extending to the splenic hilum. This measures 9.5 x 8.0 cm and previously measured 7.5 x 6.5 cm. Spleen: Stable splenomegaly. No splenic lesions or splenic infarction. Adrenals/Urinary Tract: The adrenal glands are unremarkable and stable. Stable bilateral renal cysts. No worrisome renal lesions. No collecting system abnormalities are identified. The bladder is unremarkable. Stomach/Bowel: The stomach, duodenum, small bowel and colon are grossly normal without oral contrast. Stable duodenal diverticuli. No acute inflammatory changes, mass lesions or obstructive findings. The terminal ileum and appendix are normal. Vascular/Lymphatic: Stable vascular calcifications. No aneurysm or dissection. The major venous structures are patent. The the splenic vein is occluded near the pancreatic pseudocyst but is reconstituted near the portal splenic confluence. The portal vein is patent. No mesenteric or retroperitoneal lymphadenopathy. No pelvic adenopathy. Reproductive: The prostate gland and seminal vesicles are unremarkable. Other: No pelvic mass or free pelvic fluid collections. No inguinal mass or adenopathy or hernia. Musculoskeletal: Moderate artifact from a right hip prosthesis. Stable L5-S1 fusion hardware. No significant or acute bony findings. IMPRESSION: 1. CT findings consistent with acute pancreatitis. No findings for pancreatic necrosis. 2. Enlarging pancreatic pseudocyst. 3. Stable cirrhotic changes involving the liver with portal venous hypertension, portal venous collaterals and splenomegaly. No focal hepatic lesions. 4. Stable small left basilar pulmonary nodules. Electronically Signed    By: Marijo Sanes M.D.   On: 02/20/2019 12:50    EKG: Independently reviewed.   Assessment/Plan Principal Problem:   Acute pancreatitis Active Problems:   Essential hypertension   Atrial fibrillation (HCC)   GERD   Long term (current) use of anticoagulants [Z79.01]   Cirrhosis of liver without ascites (HCC)   CKD (chronic kidney disease), stage III   History of COVID-19    Acute recurrent pancreatitis Patient presenting with progressive abdominal discomfort over the past week, with acute worsening over the past 2 days.  History of recurrent pancreatitis, prior history of EtOH abuse.  Has had cholecystectomy in the past.  Patient is afebrile without leukocytosis.  Lipase 212.  CT abdomen/pelvis notable for acute pancreatitis without necrosis, gallbladder absent and no common bile duct dilation. --Admit inpatient, medical/surgical unit --Check lipid panel to assess for hypertriglyceridemia --Continue IVF with NS at 150 mL's per hour --Pain control with Dilaudid, Norco prn --N.p.o., until symptoms improve then will attempt advancement of diet --Follow lipase daily  Hyponatremia Sodium 136, suspect some mild volume depletion. --Continue IV fluid hydration as above --Repeat BMP in the a.m.  Paroxysmal atrial fibrillation Essential hypertension --Continue metoprolol succinate 50 mg p.o. twice daily --Continue spironolactone and furosemide --Continue anticoagulation with Eliquis  History of cirrhosis --Continue furosemide and spironolactone  CKD stage IIIa Creatinine 1.66 on admission, improved from 2.24 on 01/04/2020. --Avoid nephrotoxins, renally dose all medications --Monitor renal function daily  Type 2 diabetes mellitus On Metformin 500 mg p.o. twice daily  Hyperlipidemia: Hold fenofibrate for now  Thrombocytopenia Platelets 68 on admission.  Was 117 on 01/04/2019.  No signs of active blood loss. --Avoid chemical DVT prophylaxis --Continue monitor CBC  daily  GERD: Continue PPI  BPH: Continue tamsulosin   DVT prophylaxis: SCDs, chemical DVT prophylaxis contraindicated with platelet count 68 Code Status: Full code Family Communication: Updated patient extensively at bedside Disposition Plan: Anticipate discharge home when medically ready Consults called: None Admission status: Inpatient, medical/surgical unit   Severity of Illness: The appropriate patient status for this patient is INPATIENT. Inpatient status is judged to be reasonable and necessary in order to provide the required intensity of service to ensure the patient's safety. The patient's presenting symptoms, physical exam findings, and initial radiographic and laboratory data in the context of their chronic comorbidities is felt to place them at high risk for further clinical deterioration. Furthermore, it is not anticipated that the patient will be medically stable for discharge from the hospital within 2 midnights of admission. The following factors support the patient status of inpatient.   " The patient's presenting symptoms include nausea, and vomiting, abdominal pain " The worrisome physical exam findings include diffuse abdominal tenderness " The initial radiographic and laboratory data are worrisome because of elevated lipase, platelet count 68, CT abdomen/pelvis with findings of acute pancreatitis. " The chronic co-morbidities include chronic pancreatitis, history of diverticulitis, cirrhosis, CKD stage IIIa, atrial fibrillation on anticoagulation with Eliquis, history of Covid-19 infection, essential hypertension, type 2 diabetes mellitus, HLD, obesity.   * I certify that at the point of admission it is my clinical judgment that the patient will require inpatient hospital care spanning beyond 2 midnights from the point of admission due to high intensity of service, high risk for further deterioration and high frequency of surveillance required.*    Castin Donaghue J [place]  DO [facility] Available via Epic secure chat 7am-7pm After these hours, please refer to coverage provider listed on [email or web address] 02/20/2019, 2:11 PM

## 2019-02-21 DIAGNOSIS — I4891 Unspecified atrial fibrillation: Secondary | ICD-10-CM

## 2019-02-21 LAB — COMPREHENSIVE METABOLIC PANEL
ALT: 11 U/L (ref 0–44)
AST: 9 U/L — ABNORMAL LOW (ref 15–41)
Albumin: 3.6 g/dL (ref 3.5–5.0)
Alkaline Phosphatase: 41 U/L (ref 38–126)
Anion gap: 7 (ref 5–15)
BUN: 18 mg/dL (ref 8–23)
CO2: 26 mmol/L (ref 22–32)
Calcium: 8.5 mg/dL — ABNORMAL LOW (ref 8.9–10.3)
Chloride: 100 mmol/L (ref 98–111)
Creatinine, Ser: 1.53 mg/dL — ABNORMAL HIGH (ref 0.61–1.24)
GFR calc Af Amer: 54 mL/min — ABNORMAL LOW (ref >60)
GFR calc non Af Amer: 46 mL/min — ABNORMAL LOW (ref >60)
Glucose, Bld: 162 mg/dL — ABNORMAL HIGH (ref 70–99)
Potassium: 4.1 mmol/L (ref 3.5–5.1)
Sodium: 133 mmol/L — ABNORMAL LOW (ref 135–145)
Total Bilirubin: 1.1 mg/dL (ref 0.3–1.2)
Total Protein: 6.3 g/dL — ABNORMAL LOW (ref 6.5–8.1)

## 2019-02-21 LAB — HIV ANTIBODY (ROUTINE TESTING W REFLEX): HIV Screen 4th Generation wRfx: NONREACTIVE

## 2019-02-21 LAB — GLUCOSE, CAPILLARY
Glucose-Capillary: 145 mg/dL — ABNORMAL HIGH (ref 70–99)
Glucose-Capillary: 139 mg/dL — ABNORMAL HIGH (ref 70–99)
Glucose-Capillary: 121 mg/dL — ABNORMAL HIGH (ref 70–99)

## 2019-02-21 LAB — CBC
HCT: 39.8 % (ref 39.0–52.0)
Hemoglobin: 12.5 g/dL — ABNORMAL LOW (ref 13.0–17.0)
MCH: 27.4 pg (ref 26.0–34.0)
MCHC: 31.4 g/dL (ref 30.0–36.0)
MCV: 87.3 fL (ref 80.0–100.0)
Platelets: 67 10*3/uL — ABNORMAL LOW (ref 150–400)
RBC: 4.56 MIL/uL (ref 4.22–5.81)
RDW: 13.9 % (ref 11.5–15.5)
WBC: 3.8 10*3/uL — ABNORMAL LOW (ref 4.0–10.5)
nRBC: 0 % (ref 0.0–0.2)

## 2019-02-21 LAB — LIPASE, BLOOD: Lipase: 139 U/L — ABNORMAL HIGH (ref 11–51)

## 2019-02-21 NOTE — Telephone Encounter (Signed)
Vaughan Basta, patient's wife-verified DPR-Linda is requesting for Dr. Lyndel Safe to call the patient's daughter ([phone] and update her on Steve Anthony's status as the provider in the hospital has not updated the patient nor the family-just needing an update on what are the next steps in the patient's plan of care and "expectations";  Please advise

## 2019-02-21 NOTE — Progress Notes (Signed)
PT Screen Note  Patient Details Name: Steve Anthony MRN: [id number] DOB: 1950-03-17   Cancelled Treatment:    Reason Eval/Treat Not Completed: PT screened, no needs identified, will sign off  Pt sitting EOB and eating dinner when therapist arrived. He reports he has been up and moving around without assistance today. He states he has walked up/down the hallway a few times and is going to the bathroom without assistance. Pt feels close to his normal baseline of independent. PT will sign off. Please re-consult if there is a change in functional status.  Verner Mould, DPT Physical Therapist with [facility] [phone]  02/21/2019 5:38 PM

## 2019-02-21 NOTE — Progress Notes (Signed)
PROGRESS NOTE    Steve Anthony  NOB:[id number] DOB: May 29, 1950 DOA: 02/20/2019 PCP: [name], MD    Brief Narrative:  69 y.o. male with medical history significant of chronic pancreatitis, cirrhosis without ascites, CKD stage IIIa, atrial fibrillation on anticoagulation with Eliquis, essential hypertension, type 2 diabetes mellitus, HLD, history of Covid-19 infection who presents to the ED with complaints of nausea and vomiting with associated abdominal pain over the past few days.  Patient reports onset initially about a week ago with severe worsening Sunday night.  History of alcohol abuse, quit many years ago and status post cholecystectomy.  Has not been able to keep much solids or liquids down during this timeframe.  No other specific complaints at this time.  Denies headache, no fever/chills/night sweats, no diarrhea, no chest pain, no palpitations, no shortness of breath, no cough/congestion.   Assessment & Plan:   Principal Problem:   Acute pancreatitis Active Problems:   Essential hypertension   Atrial fibrillation (HCC)   GERD   Long term (current) use of anticoagulants [Z79.01]   Cirrhosis of liver without ascites (HCC)   CKD (chronic kidney disease), stage III   History of COVID-19  Acute recurrent pancreatitis Patient presenting with progressive abdominal discomfort over the past week, with acute worsening over the past 2 days prior to admit.  History of recurrent pancreatitis, prior history of EtOH abuse.  Has had cholecystectomy in the past.  Patient is afebrile without leukocytosis.  Presenting lipase of 212.  CT abdomen/pelvis notable for acute pancreatitis without necrosis, gallbladder absent and no common bile duct dilation. --Lipid panel reviewed, TG unremarkable at 276 --Continue IVF as tolerated --Continue with pain control with Dilaudid, Norco prn --Pt reports symptoms have improved and is requesting diet. Have started on clears, currently advanced to  fulls --Repeat CMP and lipase in AM  Hyponatremia Sodium 136, suspect some mild volume depletion. --Continue IV fluid hydration as above --Recheck bmet in AM  Paroxysmal atrial fibrillation Essential hypertension --Continue metoprolol succinate 50 mg p.o. twice daily --Continue spironolactone and furosemide as tolerated --Continue anticoagulation with Eliquis. Hgb stable  History of cirrhosis --Continue furosemide and spironolactone as tolerated  CKD stage IIIa Creatinine 1.66 on admission, improved from 2.24 on 01/04/2020. --Avoid nephrotoxins, renally dose all medications --Repeat renal panel in AM  Type 2 diabetes mellitus -On Metformin 500 mg p.o. twice daily prior to admit -Currently on SSI coverage while in hospital  Hyperlipidemia: Hold fenofibrate for now  Thrombocytopenia -Platelets 68 on admission.  Was 117 on 01/04/2019.  No signs of active blood loss. --Avoid chemical DVT prophylaxis --Plts stable  GERD: Continue PPI  BPH: Continue tamsulosin  DVT prophylaxis: SCD's Code Status: Full Family Communication: Pt in room, family not at bedside Disposition Plan: Home when pancratitis resolves and pt tolerates soft diet  Consultants:     Procedures:     Antimicrobials: Anti-infectives (From admission, onward)   None       Subjective: Feeling better today. Asking to advance diet  Objective: Vitals:   02/20/19 2030 02/21/19 0659 02/21/19 0836 02/21/19 1410  BP: (!) 123/93 114/74 102/79 100/81  Pulse: 77 71 72 73  Resp: 18 18  18   Temp: 98.2 F (36.8 C) 97.8 F (36.6 C)  (!) 97.4 F (36.3 C)  TempSrc:  Oral  Oral  SpO2: 95% 92% 94% 94%  Weight:      Height:        Intake/Output Summary (Last 24 hours) at 02/21/2019 1722 Last  data filed at 02/21/2019 1600 Gross per 24 hour  Intake 2691.56 ml  Output --  Net 2691.56 ml   Filed Weights   02/20/19 0943  Weight: 108.9 kg    Examination:  General exam: Appears calm and  comfortable  Respiratory system: Clear to auscultation. Respiratory effort normal. Cardiovascular system: S1 & S2 heard, Regular Gastrointestinal system: Abdomen is nondistended, soft and nontender. No organomegaly or masses felt. Normal bowel sounds heard. Central nervous system: Alert and oriented. No focal neurological deficits. Extremities: Symmetric 5 x 5 power. Skin: No rashes, lesions  Psychiatry: Judgement and insight appear normal. Mood & affect appropriate.   Data Reviewed: I have personally reviewed following labs and imaging studies  CBC: Recent Labs  Lab 02/20/19 1044 02/21/19 0429  WBC 4.6 3.8*  HGB 14.4 12.5*  HCT 44.3 39.8  MCV 86.9 87.3  PLT 68* 67*   Basic Metabolic Panel: Recent Labs  Lab 02/20/19 1044 02/21/19 0429  NA 136 133*  K 4.6 4.1  CL 99 100  CO2 25 26  GLUCOSE 206* 162*  BUN 19 18  CREATININE 1.66* 1.53*  CALCIUM 9.3 8.5*   GFR: Estimated Creatinine Clearance: 59.7 mL/min (A) (by C-G formula based on SCr of 1.53 mg/dL (H)). Liver Function Tests: Recent Labs  Lab 02/20/19 1044 02/21/19 0429  AST 13* 9*  ALT 16 11  ALKPHOS 45 41  BILITOT 1.2 1.1  PROT 7.5 6.3*  ALBUMIN 4.3 3.6   Recent Labs  Lab 02/20/19 1044 02/21/19 0429  LIPASE 212* 139*   Recent Labs  Lab 02/20/19 1113  AMMONIA <9*   Coagulation Profile: No results for input(s): INR, PROTIME in the last 168 hours. Cardiac Enzymes: No results for input(s): CKTOTAL, CKMB, CKMBINDEX, TROPONINI in the last 168 hours. BNP (last 3 results) No results for input(s): PROBNP in the last 8760 hours. HbA1C: Recent Labs    02/20/19 2042  HGBA1C 8.0*   CBG: Recent Labs  Lab 02/20/19 2203 02/21/19 0741 02/21/19 1133 02/21/19 1715  GLUCAP 131* 145* 139* 121*   Lipid Profile: Recent Labs    02/20/19 2042  CHOL 113  HDL 20*  LDLCALC 38  TRIG 276*  CHOLHDL 5.7   Thyroid Function Tests: No results for input(s): TSH, T4TOTAL, FREET4, T3FREE, THYROIDAB in the last  72 hours. Anemia Panel: No results for input(s): VITAMINB12, FOLATE, FERRITIN, TIBC, IRON, RETICCTPCT in the last 72 hours. Sepsis Labs: No results for input(s): PROCALCITON, LATICACIDVEN in the last 168 hours.  Recent Results (from the past 240 hour(s))  SARS CORONAVIRUS 2 (TAT 6-24 HRS) Nasopharyngeal Nasopharyngeal Swab     Status: Abnormal   Collection Time: 02/20/19  1:06 PM   Specimen: Nasopharyngeal Swab  Result Value Ref Range Status   SARS Coronavirus 2 POSITIVE (A) NEGATIVE Final    Comment: RESULT CALLED TO, READ BACK BY AND VERIFIED WITH: C ZULETA,RN 1809 02/20/2019 D BRADLEY (NOTE) SARS-CoV-2 target nucleic acids are DETECTED. The SARS-CoV-2 RNA is generally detectable in upper and lower respiratory specimens during the acute phase of infection. Positive results are indicative of the presence of SARS-CoV-2 RNA. Clinical correlation with patient history and other diagnostic information is  necessary to determine patient infection status. Positive results do not rule out bacterial infection or co-infection with other viruses.  The expected result is Negative. Fact Sheet for Patients: SugarRoll.be Fact Sheet for Healthcare Providers: https://www.woods-mathews.com/ This test is not yet approved or cleared by the [place] FDA and  has been authorized for detection  and/or diagnosis of SARS-CoV-2 by FDA under an Emergency Use Authorization (EUA). This EUA will remain  in effect (meaning this test can be used) for th e duration of the COVID-19 declaration under Section 564(b)(1) of the Act, 21 U.S.C. section 360bbb-3(b)(1), unless the authorization is terminated or revoked sooner. Performed at [facility], [place] [address], [place], [place] [address]      Radiology Studies: CT ABDOMEN PELVIS W CONTRAST  Result Date: 02/20/2019 CLINICAL DATA:  Nausea and abdominal pain. EXAM: CT ABDOMEN AND PELVIS WITH CONTRAST TECHNIQUE:  Multidetector CT imaging of the abdomen and pelvis was performed using the standard protocol following bolus administration of intravenous contrast. CONTRAST:  40m OMNIPAQUE IOHEXOL 300 MG/ML  SOLN COMPARISON:  Multiple prior CT scans from 2020. The most recent is November 09, 2018. FINDINGS: Lower chest: The lung bases are clear of an acute process. Small left basilar pulmonary nodules are stable. There is also a calcified granuloma at the right lung base. The heart is normal in size. No pericardial effusion. Stable age advanced coronary artery calcifications. Hepatobiliary: Stable cirrhotic changes involving the liver with mild liver contour abnormality, dilated hepatic fissures, increased caudate to right lobe ratio, portal venous hypertension, portal venous collaterals and extensive gastric varices. Diffuse hepatic steatosis again demonstrated. No focal hepatic lesions. The portal and hepatic veins are patent. The gallbladder is surgically absent. No common bile duct dilatation. Pancreas: CT findings of acute pancreatitis with pancreatic and peripancreatic inflammatory changes. I do not see any findings for pancreatic necrosis. Enlarging well-formed pseudocyst in the pancreatic tail extending to the splenic hilum. This measures 9.5 x 8.0 cm and previously measured 7.5 x 6.5 cm. Spleen: Stable splenomegaly. No splenic lesions or splenic infarction. Adrenals/Urinary Tract: The adrenal glands are unremarkable and stable. Stable bilateral renal cysts. No worrisome renal lesions. No collecting system abnormalities are identified. The bladder is unremarkable. Stomach/Bowel: The stomach, duodenum, small bowel and colon are grossly normal without oral contrast. Stable duodenal diverticuli. No acute inflammatory changes, mass lesions or obstructive findings. The terminal ileum and appendix are normal. Vascular/Lymphatic: Stable vascular calcifications. No aneurysm or dissection. The major venous structures are patent.  The the splenic vein is occluded near the pancreatic pseudocyst but is reconstituted near the portal splenic confluence. The portal vein is patent. No mesenteric or retroperitoneal lymphadenopathy. No pelvic adenopathy. Reproductive: The prostate gland and seminal vesicles are unremarkable. Other: No pelvic mass or free pelvic fluid collections. No inguinal mass or adenopathy or hernia. Musculoskeletal: Moderate artifact from a right hip prosthesis. Stable L5-S1 fusion hardware. No significant or acute bony findings. IMPRESSION: 1. CT findings consistent with acute pancreatitis. No findings for pancreatic necrosis. 2. Enlarging pancreatic pseudocyst. 3. Stable cirrhotic changes involving the liver with portal venous hypertension, portal venous collaterals and splenomegaly. No focal hepatic lesions. 4. Stable small left basilar pulmonary nodules. Electronically Signed   By: PMarijo SanesM.D.   On: 02/20/2019 12:50    Scheduled Meds: . apixaban  5 mg Oral BID  . escitalopram  10 mg Oral Daily  . furosemide  40 mg Oral Daily  . gabapentin  300 mg Oral BID  . insulin aspart  0-5 Units Subcutaneous QHS  . insulin aspart  0-9 Units Subcutaneous TID WC  . Melatonin  10 mg Oral QHS  . metoprolol succinate  50 mg Oral BID  . pantoprazole  40 mg Oral BID  . tamsulosin  0.4 mg Oral Daily  . tiZANidine  4 mg Oral BID  .  traZODone  100 mg Oral QHS   Continuous Infusions: . sodium chloride 150 mL/hr at 02/21/19 1044     LOS: 1 day   Marylu Lund, MD [facility] Pager On Amion  If 7PM-7AM, please contact night-coverage 02/21/2019, 5:22 PM

## 2019-02-22 LAB — GLUCOSE, CAPILLARY
Glucose-Capillary: 130 mg/dL — ABNORMAL HIGH (ref 70–99)
Glucose-Capillary: 167 mg/dL — ABNORMAL HIGH (ref 70–99)
Glucose-Capillary: 194 mg/dL — ABNORMAL HIGH (ref 70–99)

## 2019-02-22 LAB — COMPREHENSIVE METABOLIC PANEL
ALT: 8 U/L (ref 0–44)
AST: 10 U/L — ABNORMAL LOW (ref 15–41)
Albumin: 3.2 g/dL — ABNORMAL LOW (ref 3.5–5.0)
Alkaline Phosphatase: 36 U/L — ABNORMAL LOW (ref 38–126)
Anion gap: 6 (ref 5–15)
BUN: 16 mg/dL (ref 8–23)
CO2: 26 mmol/L (ref 22–32)
Calcium: 8.3 mg/dL — ABNORMAL LOW (ref 8.9–10.3)
Chloride: 103 mmol/L (ref 98–111)
Creatinine, Ser: 1.38 mg/dL — ABNORMAL HIGH (ref 0.61–1.24)
GFR calc Af Amer: >60 mL/min (ref >60)
GFR calc non Af Amer: 53 mL/min — ABNORMAL LOW (ref >60)
Glucose, Bld: 142 mg/dL — ABNORMAL HIGH (ref 70–99)
Potassium: 3.9 mmol/L (ref 3.5–5.1)
Sodium: 135 mmol/L (ref 135–145)
Total Bilirubin: 1 mg/dL (ref 0.3–1.2)
Total Protein: 5.7 g/dL — ABNORMAL LOW (ref 6.5–8.1)

## 2019-02-22 LAB — LIPASE, BLOOD: Lipase: 162 U/L — ABNORMAL HIGH (ref 11–51)

## 2019-02-22 NOTE — Discharge Summary (Signed)
Physician Discharge Summary  Steve Anthony [facility]:[id number] DOB: 1950/10/30 DOA: 02/20/2019  PCP: [name], MD  Admit date: 02/20/2019 Discharge date: 02/22/2019  Admitted From: hoem Disposition:  home  Recommendations for Outpatient Follow-up:  1. Follow up with PCP and GI- DR GUPTA in 1-2 weeks 2. Please obtain BMP/CBC in one week 3. Please follow up on the following pending results:  Home Health: None Equipment/Devices: None  Discharge Condition: Stable Code Status: Full code Diet recommendation: Heart Healthy, diabetic soft diet  Brief/Interim Summary:  [age]YOM with significant history of chronic bronchitis, cirrhosis without ascites, CKD stage IIIa, A. fib on Eliquis, HTN, T2DM, HLD, history of COVID-19 infection presents to ER with nausea, vomiting, abdominal pain for past few days, onset about 1 week prior to admission.  He has history of alcohol abuse but quit many years ago, history of cholecystectomy.  Patient was not able to give much solid or liquid down, denied other symptoms fever night sweats diarrhea chest pain shortness of breath cough congestion. In the ER saturating well on room air, creatinine 1.6 lipase 212 LFTs stable with total bili 1.2 UA unrevealing, CT abdomen pelvis significant for acute pancreatitis without necrosis, absent gallbladder without common duct dilatation, was given IV fluids morphine and admitted for further management.  Patient [id number] came as positive.  Patient reportedly tested positive on December 30-and his symptoms that time werer headache and cough.  Patient admitted and managed conservatively.  Tolerating diet no recurrence of nausea vomiting. Labs are stable with elevated lipase which I suspect will be chronicand  since patient has clinically improved if he tolerates soft diet he will be discharged this afternoon. He will Follow-up with Dr. Lyndel Safe.  Discharge Diagnoses:  Assessment & Plan:  Acute on chronic idiopathic recurrent  pancreatitis, followed by Dr. Lyndel Safe outpatient had EUS 06/2018 with 6.5 x 5.4 pseudocyst. he has had extensive work-up for his pancreatitis and is followed by Dr. Lyndel Safe as o/p.  managed conservatively with liquid diet, aggressive IV fluid hydration, oral oxycodone/IV Dilaudid for pain management.  Diet advanced to soft diet if he tolerates she will be discharged home.  Cryptogenic liver cirrhosis likely due to NASH diagnosed February/2020 on CT scan with portal hypertension/moderate splenomegaly, mild thrombocytopenia/portal venous collaterals, grade 1 esophageal varices on EGD in June/2020 - work-up for etiology in 2020.  Followed by Dr. Lyndel Safe.  COPD: Doing well on room air.  PAF on Eliquis,Rate controlled, on metoprolol.  CAD/HTN/diastolic CHF: No chest pain, on Eliquis, metoprolol Lasix/Aldactone  CKD stage IIIa: Creatinine appears improved, 2.2 on 01/04/19.monitor Recent Labs  Lab 02/20/19 1044 02/21/19 0429 02/22/19 0514  BUN 19 18 16   CREATININE 1.66* 1.53* 1.38*   T2DM holding oral Metformin currently on sliding scale insulin.  Sugar is stable 120s to 140s. A1c poorly controlled 8.2.  Resume home meds upon discharge  History of COVID-19 ( 01/28/19) asymptomatic, tested positive again this admission  HLD on fenofibrate  Chronic thrombocytopenia in the setting of liver cirrhosis Recent Labs  Lab 02/20/19 1044 02/21/19 0429  PLT 68* 67*   GERD: on ppi.  BPH- on flomax  Body mass index is 31.6 kg/m.   DVT prophylaxis: eliquis Code Status:full Family Communication: plan of care discussed with patient at bedside. Disposition Plan: Discharge home once tolerates soft diet today.    Consultants:none  Procedures:see note     Subjective: Subjective: Ambulating in the hallway, on room air,tolerating diet no nausea vomiting abdominal pain.  Wants to go home. Saturating well  on room air afebrile, heart rate in 60s blood pressure is stable. Lab with creatinine 1.3 from 1.5,  lipase 162, AST ALT and total bili stable  Discharge Exam: Vitals:   02/21/19 2035 02/22/19 0607  BP: 109/70 118/80  Pulse: 71 67  Resp: 16 18  Temp: 98.2 F (36.8 C) 97.6 F (36.4 C)  SpO2: 94% 92%   General: Pt is alert, awake, not in acute distress Cardiovascular: RRR, S1/S2 +, no rubs, no gallops Respiratory: CTA bilaterally, no wheezing, no rhonchi Abdominal: Soft, NT, ND, bowel sounds + Extremities: no edema, no cyanosis  Discharge Instructions  Discharge Instructions    Diet Carb Modified   Complete by: As directed    Continue soft diet   Discharge instructions   Complete by: As directed    Please call call MD or return to ER for similar or worsening recurring problem that brought you to hospital or if any fever,nausea/vomiting,abdominal pain, uncontrolled pain, chest pain,  shortness of breath or any other alarming symptoms.  Please follow-up your doctor-primary care doctor and your GI doctor Dr. Lyndel Safe as instructed in a week time and call the office for appointment.  Please avoid alcohol, smoking, or any other illicit substance and maintain healthy habits including taking your regular medications as prescribed.  You were cared for by a hospitalist during your hospital stay. If you have any questions about your discharge medications or the care you received while you were in the hospital after you are discharged, you can call the unit and ask to speak with the hospitalist on call if the hospitalist that took care of you is not available.  Once you are discharged, your primary care physician will handle any further medical issues. Please note that NO REFILLS for any discharge medications will be authorized once you are discharged, as it is imperative that you return to your primary care physician (or establish a relationship with a primary care physician if you do not have one) for your aftercare needs so that they can reassess your need for medications and monitor your lab  values   Increase activity slowly   Complete by: As directed      Allergies as of 02/22/2019      Reactions   Penicillins Hives, Shortness Of Breath   Did it involve swelling of the face/tongue/throat, SOB, or low BP? Yes Did it involve sudden or severe rash/hives, skin peeling, or any reaction on the inside of your mouth or nose? No Did you need to seek medical attention at a hospital or doctor's office? Yes When did it last happen?40 + years If all above answers are "NO", may proceed with cephalosporin use.      Medication List    TAKE these medications   acetaminophen 325 MG tablet Commonly known as: TYLENOL Take by mouth every 6 (six) hours as needed for headache.   albuterol 108 (90 Base) MCG/ACT inhaler Commonly known as: VENTOLIN HFA Inhale 2 puffs into the lungs every 4 (four) hours as needed.   Colace 100 MG capsule Generic drug: docusate sodium Take 100 mg by mouth See admin instructions. Three times a week   Eliquis 5 MG Tabs tablet Generic drug: apixaban TAKE 1 TABLET BY MOUTH TWICE A DAY What changed: how much to take   escitalopram 10 MG tablet Commonly known as: LEXAPRO Take 10 mg by mouth daily.   fenofibrate 160 MG tablet Take 160 mg by mouth daily.   ferrous sulfate 325 (65 FE) MG  tablet Take 325 mg by mouth See admin instructions. Take 1 tablet 2 times a week   furosemide 20 MG tablet Commonly known as: LASIX Take 40 mg by mouth daily.   gabapentin 300 MG capsule Commonly known as: NEURONTIN Take 300 mg by mouth 2 (two) times daily.   Melatonin 10 MG Tabs Take 10 mg by mouth at bedtime.   metFORMIN 500 MG tablet Commonly known as: GLUCOPHAGE Take 500 mg by mouth 2 (two) times daily.   metoprolol succinate 50 MG 24 hr tablet Commonly known as: TOPROL-XL TAKE 1 TABLET BY MOUTH TWICE A DAY   Narcan 4 MG/0.1ML Liqd nasal spray kit Generic drug: naloxone Place 1 spray into the nose daily as needed (opioid overdose).    nitroGLYCERIN 0.4 MG SL tablet Commonly known as: NITROSTAT Place 1 tablet (0.4 mg total) under the tongue every 5 (five) minutes x 3 doses as needed for chest pain.   OVER THE COUNTER MEDICATION Apply 1 application topically daily as needed (rash). OTC fanny cream   oxyCODONE-acetaminophen 10-325 MG tablet Commonly known as: PERCOCET Take 1 tablet by mouth every 4 (four) hours as needed for pain.   OXYGEN Inhale 2 L into the lungs daily as needed (shortness of breath).   pantoprazole 40 MG tablet Commonly known as: PROTONIX TAKE 1 TABLET BY MOUTH TWICE A DAY   promethazine 25 MG tablet Commonly known as: PHENERGAN Take 1 tablet (25 mg total) by mouth every 8 (eight) hours as needed for nausea or vomiting.   spironolactone 25 MG tablet Commonly known as: ALDACTONE Take 25 mg by mouth daily.   tamsulosin 0.4 MG Caps capsule Commonly known as: FLOMAX Take 0.4 mg by mouth daily.   tiZANidine 4 MG tablet Commonly known as: ZANAFLEX Take 4 mg by mouth 2 (two) times daily. In the evening   traZODone 50 MG tablet Commonly known as: DESYREL Take 100 mg by mouth at bedtime.   VITAMIN B-12 IJ Inject 1,000 mcg as directed every 30 (thirty) days.      Follow-up Information    [name], MD Follow up in 1 week(s).   Specialty: Internal Medicine Contact information: [place] [address] [phone]        Thompson Grayer, MD .   Specialty: Cardiology Contact information: [place] [address] [phone]        Jackquline Denmark, MD Follow up in 2 week(s).   Specialties: Gastroenterology, Internal Medicine Contact information: [place] [address] [phone]          Allergies  Allergen Reactions  . Penicillins Hives and Shortness Of Breath    Did it involve swelling of the face/tongue/throat, SOB, or low BP? Yes Did it involve sudden or severe rash/hives, skin  peeling, or any reaction on the inside of your mouth or nose? No Did you need to seek medical attention at a hospital or doctor's office? Yes When did it last happen?40 + years If all above answers are "NO", may proceed with cephalosporin use.      The results of significant diagnostics from this hospitalization (including imaging, microbiology, ancillary and laboratory) are listed below for reference.    Microbiology: Recent Results (from the past 240 hour(s))  SARS CORONAVIRUS 2 (TAT 6-24 HRS) Nasopharyngeal Nasopharyngeal Swab     Status: Abnormal   Collection Time: 02/20/19  1:06 PM   Specimen: Nasopharyngeal Swab  Result Value Ref  Range Status   SARS Coronavirus 2 POSITIVE (A) NEGATIVE Final    Comment: RESULT CALLED TO, READ BACK BY AND VERIFIED WITH: C ZULETA,RN 1809 02/20/2019 D BRADLEY (NOTE) SARS-CoV-2 target nucleic acids are DETECTED. The SARS-CoV-2 RNA is generally detectable in upper and lower respiratory specimens during the acute phase of infection. Positive results are indicative of the presence of SARS-CoV-2 RNA. Clinical correlation with patient history and other diagnostic information is  necessary to determine patient infection status. Positive results do not rule out bacterial infection or co-infection with other viruses.  The expected result is Negative. Fact Sheet for Patients: SugarRoll.be Fact Sheet for Healthcare Providers: https://www.woods-mathews.com/ This test is not yet approved or cleared by the [place] FDA and  has been authorized for detection and/or diagnosis of SARS-CoV-2 by FDA under an Emergency Use Authorization (EUA). This EUA will remain  in effect (meaning this test can be used) for th e duration of the COVID-19 declaration under Section 564(b)(1) of the Act, 21 U.S.C. section 360bbb-3(b)(1), unless the authorization is terminated or revoked sooner. Performed at [facility], [place] [address], [place], [place] [address]     Procedures/Studies: CT ABDOMEN PELVIS W CONTRAST  Result Date: 02/20/2019 CLINICAL DATA:  Nausea and abdominal pain. EXAM: CT ABDOMEN AND PELVIS WITH CONTRAST TECHNIQUE: Multidetector CT imaging of the abdomen and pelvis was performed using the standard protocol following bolus administration of intravenous contrast. CONTRAST:  72m OMNIPAQUE IOHEXOL 300 MG/ML  SOLN COMPARISON:  Multiple prior CT scans from 2020. The most recent is November 09, 2018. FINDINGS: Lower chest: The lung bases are clear of an acute process. Small left basilar pulmonary nodules are stable. There is also a calcified granuloma at the right lung base. The heart is normal in size. No pericardial effusion. Stable age advanced coronary artery calcifications. Hepatobiliary: Stable cirrhotic changes involving the liver with mild liver contour abnormality, dilated hepatic fissures, increased caudate to right lobe ratio, portal venous hypertension, portal venous collaterals and extensive gastric varices. Diffuse hepatic steatosis again demonstrated. No focal hepatic lesions. The portal and hepatic veins are patent. The gallbladder is surgically absent. No common bile duct dilatation. Pancreas: CT findings of acute pancreatitis with pancreatic and peripancreatic inflammatory changes. I do not see any findings for pancreatic necrosis. Enlarging well-formed pseudocyst in the pancreatic tail extending to the splenic hilum. This measures 9.5 x 8.0 cm and previously measured 7.5 x 6.5 cm. Spleen: Stable splenomegaly. No splenic lesions or splenic infarction. Adrenals/Urinary Tract: The adrenal glands are unremarkable and stable. Stable bilateral renal cysts. No worrisome renal lesions. No collecting system abnormalities are identified. The bladder is unremarkable. Stomach/Bowel: The stomach, duodenum, small bowel and colon are grossly normal without oral contrast. Stable duodenal diverticuli. No acute  inflammatory changes, mass lesions or obstructive findings. The terminal ileum and appendix are normal. Vascular/Lymphatic: Stable vascular calcifications. No aneurysm or dissection. The major venous structures are patent. The the splenic vein is occluded near the pancreatic pseudocyst but is reconstituted near the portal splenic confluence. The portal vein is patent. No mesenteric or retroperitoneal lymphadenopathy. No pelvic adenopathy. Reproductive: The prostate gland and seminal vesicles are unremarkable. Other: No pelvic mass or free pelvic fluid collections. No inguinal mass or adenopathy or hernia. Musculoskeletal: Moderate artifact from a right hip prosthesis. Stable L5-S1 fusion hardware. No significant or acute bony findings. IMPRESSION: 1. CT findings consistent with acute pancreatitis. No findings for pancreatic necrosis. 2. Enlarging pancreatic pseudocyst. 3. Stable cirrhotic changes involving the liver  with portal venous hypertension, portal venous collaterals and splenomegaly. No focal hepatic lesions. 4. Stable small left basilar pulmonary nodules. Electronically Signed   By: Marijo Sanes M.D.   On: 02/20/2019 12:50     Labs: BNP (last 3 results) No results for input(s): BNP in the last 8760 hours. Basic Metabolic Panel: Recent Labs  Lab 02/20/19 1044 02/21/19 0429 02/22/19 0514  NA 136 133* 135  K 4.6 4.1 3.9  CL 99 100 103  CO2 25 26 26   GLUCOSE 206* 162* 142*  BUN 19 18 16   CREATININE 1.66* 1.53* 1.38*  CALCIUM 9.3 8.5* 8.3*   Liver Function Tests: Recent Labs  Lab 02/20/19 1044 02/21/19 0429 02/22/19 0514  AST 13* 9* 10*  ALT 16 11 8   ALKPHOS 45 41 36*  BILITOT 1.2 1.1 1.0  PROT 7.5 6.3* 5.7*  ALBUMIN 4.3 3.6 3.2*   Recent Labs  Lab 02/20/19 1044 02/21/19 0429 02/22/19 0514  LIPASE 212* 139* 162*   Recent Labs  Lab 02/20/19 1113  AMMONIA <9*   CBC: Recent Labs  Lab 02/20/19 1044 02/21/19 0429  WBC 4.6 3.8*  HGB 14.4 12.5*  HCT 44.3 39.8  MCV  86.9 87.3  PLT 68* 67*   Cardiac Enzymes: No results for input(s): CKTOTAL, CKMB, CKMBINDEX, TROPONINI in the last 168 hours. BNP: Invalid input(s): POCBNP CBG: Recent Labs  Lab 02/20/19 2203 02/21/19 0741 02/21/19 1133 02/21/19 1715  GLUCAP 131* 145* 139* 121*   D-Dimer No results for input(s): DDIMER in the last 72 hours. Hgb A1c Recent Labs    02/20/19 2042  HGBA1C 8.0*   Lipid Profile Recent Labs    02/20/19 2042  CHOL 113  HDL 20*  LDLCALC 38  TRIG 276*  CHOLHDL 5.7   Thyroid function studies No results for input(s): TSH, T4TOTAL, T3FREE, THYROIDAB in the last 72 hours.  Invalid input(s): FREET3 Anemia work up No results for input(s): VITAMINB12, FOLATE, FERRITIN, TIBC, IRON, RETICCTPCT in the last 72 hours. Urinalysis    Component Value Date/Time   COLORURINE YELLOW 02/20/2019 1002   APPEARANCEUR CLEAR 02/20/2019 1002   LABSPEC 1.010 02/20/2019 1002   PHURINE 5.0 02/20/2019 1002   GLUCOSEU NEGATIVE 02/20/2019 1002   [place] 02/20/2019 [place] 02/20/2019 [place] 02/20/2019 1002   PROTEINUR NEGATIVE 02/20/2019 1002   NITRITE NEGATIVE 02/20/2019 [place] 02/20/2019 1002   Sepsis Labs Invalid input(s): PROCALCITONIN,  WBC,  LACTICIDVEN Microbiology Recent Results (from the past 240 hour(s))  SARS CORONAVIRUS 2 (TAT 6-24 HRS) Nasopharyngeal Nasopharyngeal Swab     Status: Abnormal   Collection Time: 02/20/19  1:06 PM   Specimen: Nasopharyngeal Swab  Result Value Ref Range Status   SARS Coronavirus 2 POSITIVE (A) NEGATIVE Final    Comment: RESULT CALLED TO, READ BACK BY AND VERIFIED WITH: C ZULETA,RN 1809 02/20/2019 D BRADLEY (NOTE) SARS-CoV-2 target nucleic acids are DETECTED. The SARS-CoV-2 RNA is generally detectable in upper and lower respiratory specimens during the acute phase of infection. Positive results are indicative of the presence of SARS-CoV-2 RNA. Clinical correlation  with patient history and other diagnostic information is  necessary to determine patient infection status. Positive results do not rule out bacterial infection or co-infection with other viruses.  The expected result is Negative. Fact Sheet for Patients: SugarRoll.be Fact Sheet for Healthcare Providers: https://www.woods-mathews.com/ This test is not yet approved or cleared by the [place] FDA and  has been authorized for detection  and/or diagnosis of SARS-CoV-2 by FDA under an Emergency Use Authorization (EUA). This EUA will remain  in effect (meaning this test can be used) for th e duration of the COVID-19 declaration under Section 564(b)(1) of the Act, 21 U.S.C. section 360bbb-3(b)(1), unless the authorization is terminated or revoked sooner. Performed at [facility], [place] [address] [place], [place] [address]      Time coordinating discharge: 25  minutes  SIGNED: Antonieta Pert, MD  [facility] 02/22/2019, 10:46 AM  If 7PM-7AM, please contact night-coverage [email or web address]

## 2019-03-05 ENCOUNTER — Telehealth: Payer: Self-pay | Admitting: Gastroenterology

## 2019-03-05 NOTE — Telephone Encounter (Signed)
Pt reported that he has had an extreme loss of appetite.  He stated that he has only eaten two cups of Jello and drank 24-oz water within 6 days.

## 2019-03-06 ENCOUNTER — Emergency Department: Payer: Medicare PPO

## 2019-03-06 ENCOUNTER — Other Ambulatory Visit: Payer: Self-pay

## 2019-03-06 ENCOUNTER — Ambulatory Visit: Payer: Medicare PPO | Admitting: Nurse Practitioner

## 2019-03-06 ENCOUNTER — Inpatient Hospital Stay (HOSPITAL_BASED_OUTPATIENT_CLINIC_OR_DEPARTMENT_OTHER)
Admission: EM | Admit: 2019-03-06 | Discharge: 2019-03-11 | DRG: 439 | Disposition: A | Discharge status: Home Health | Payer: Medicare PPO | Attending: Family Medicine | Admitting: Family Medicine

## 2019-03-06 ENCOUNTER — Encounter (HOSPITAL_BASED_OUTPATIENT_CLINIC_OR_DEPARTMENT_OTHER): Payer: Self-pay | Admitting: *Deleted

## 2019-03-06 ENCOUNTER — Emergency Department (HOSPITAL_BASED_OUTPATIENT_CLINIC_OR_DEPARTMENT_OTHER): Payer: Medicare PPO

## 2019-03-06 ENCOUNTER — Telehealth: Payer: Self-pay | Admitting: Emergency Medicine

## 2019-03-06 ENCOUNTER — Encounter: Payer: Self-pay | Admitting: Nurse Practitioner

## 2019-03-06 VITALS — BP 102/78 | HR 92 | Temp 97.1°F | Ht 72.0 in | Wt 229.0 lb

## 2019-03-06 DIAGNOSIS — N1831 Chronic kidney disease, stage 3a: Secondary | ICD-10-CM | POA: Diagnosis present

## 2019-03-06 DIAGNOSIS — I2510 Atherosclerotic heart disease of native coronary artery without angina pectoris: Secondary | ICD-10-CM | POA: Diagnosis present

## 2019-03-06 DIAGNOSIS — N400 Enlarged prostate without lower urinary tract symptoms: Secondary | ICD-10-CM | POA: Diagnosis present

## 2019-03-06 DIAGNOSIS — B952 Enterococcus as the cause of diseases classified elsewhere: Secondary | ICD-10-CM | POA: Diagnosis present

## 2019-03-06 DIAGNOSIS — K863 Pseudocyst of pancreas: Secondary | ICD-10-CM

## 2019-03-06 DIAGNOSIS — I129 Hypertensive chronic kidney disease with stage 1 through stage 4 chronic kidney disease, or unspecified chronic kidney disease: Secondary | ICD-10-CM | POA: Diagnosis present

## 2019-03-06 DIAGNOSIS — Z833 Family history of diabetes mellitus: Secondary | ICD-10-CM

## 2019-03-06 DIAGNOSIS — Z7984 Long term (current) use of oral hypoglycemic drugs: Secondary | ICD-10-CM | POA: Diagnosis not present

## 2019-03-06 DIAGNOSIS — K766 Portal hypertension: Secondary | ICD-10-CM | POA: Diagnosis present

## 2019-03-06 DIAGNOSIS — Z8616 Personal history of COVID-19: Secondary | ICD-10-CM

## 2019-03-06 DIAGNOSIS — Z880 Allergy status to penicillin: Secondary | ICD-10-CM | POA: Diagnosis not present

## 2019-03-06 DIAGNOSIS — Z955 Presence of coronary angioplasty implant and graft: Secondary | ICD-10-CM

## 2019-03-06 DIAGNOSIS — K219 Gastro-esophageal reflux disease without esophagitis: Secondary | ICD-10-CM | POA: Diagnosis present

## 2019-03-06 DIAGNOSIS — K7460 Unspecified cirrhosis of liver: Secondary | ICD-10-CM | POA: Diagnosis present

## 2019-03-06 DIAGNOSIS — Z79891 Long term (current) use of opiate analgesic: Secondary | ICD-10-CM

## 2019-03-06 DIAGNOSIS — F329 Major depressive disorder, single episode, unspecified: Secondary | ICD-10-CM | POA: Diagnosis present

## 2019-03-06 DIAGNOSIS — N179 Acute kidney failure, unspecified: Secondary | ICD-10-CM | POA: Diagnosis present

## 2019-03-06 DIAGNOSIS — Z87442 Personal history of urinary calculi: Secondary | ICD-10-CM

## 2019-03-06 DIAGNOSIS — N183 Chronic kidney disease, stage 3 unspecified: Secondary | ICD-10-CM | POA: Diagnosis present

## 2019-03-06 DIAGNOSIS — E0590 Thyrotoxicosis, unspecified without thyrotoxic crisis or storm: Secondary | ICD-10-CM | POA: Diagnosis present

## 2019-03-06 DIAGNOSIS — Z823 Family history of stroke: Secondary | ICD-10-CM

## 2019-03-06 DIAGNOSIS — D6959 Other secondary thrombocytopenia: Secondary | ICD-10-CM | POA: Diagnosis present

## 2019-03-06 DIAGNOSIS — M545 Low back pain: Secondary | ICD-10-CM | POA: Diagnosis present

## 2019-03-06 DIAGNOSIS — Z79890 Hormone replacement therapy (postmenopausal): Secondary | ICD-10-CM

## 2019-03-06 DIAGNOSIS — R109 Unspecified abdominal pain: Secondary | ICD-10-CM

## 2019-03-06 DIAGNOSIS — Z79899 Other long term (current) drug therapy: Secondary | ICD-10-CM | POA: Diagnosis not present

## 2019-03-06 DIAGNOSIS — G8929 Other chronic pain: Secondary | ICD-10-CM | POA: Diagnosis present

## 2019-03-06 DIAGNOSIS — I480 Paroxysmal atrial fibrillation: Secondary | ICD-10-CM | POA: Diagnosis present

## 2019-03-06 DIAGNOSIS — Z7901 Long term (current) use of anticoagulants: Secondary | ICD-10-CM

## 2019-03-06 DIAGNOSIS — E1122 Type 2 diabetes mellitus with diabetic chronic kidney disease: Secondary | ICD-10-CM | POA: Diagnosis present

## 2019-03-06 DIAGNOSIS — K439 Ventral hernia without obstruction or gangrene: Secondary | ICD-10-CM | POA: Diagnosis present

## 2019-03-06 DIAGNOSIS — J449 Chronic obstructive pulmonary disease, unspecified: Secondary | ICD-10-CM | POA: Diagnosis present

## 2019-03-06 DIAGNOSIS — E785 Hyperlipidemia, unspecified: Secondary | ICD-10-CM | POA: Diagnosis present

## 2019-03-06 DIAGNOSIS — Z96641 Presence of right artificial hip joint: Secondary | ICD-10-CM | POA: Diagnosis present

## 2019-03-06 DIAGNOSIS — K859 Acute pancreatitis without necrosis or infection, unspecified: Secondary | ICD-10-CM | POA: Diagnosis present

## 2019-03-06 DIAGNOSIS — D631 Anemia in chronic kidney disease: Secondary | ICD-10-CM | POA: Diagnosis present

## 2019-03-06 DIAGNOSIS — K7469 Other cirrhosis of liver: Secondary | ICD-10-CM | POA: Diagnosis present

## 2019-03-06 DIAGNOSIS — R1012 Left upper quadrant pain: Secondary | ICD-10-CM | POA: Diagnosis not present

## 2019-03-06 DIAGNOSIS — K861 Other chronic pancreatitis: Secondary | ICD-10-CM | POA: Diagnosis present

## 2019-03-06 DIAGNOSIS — Z981 Arthrodesis status: Secondary | ICD-10-CM

## 2019-03-06 DIAGNOSIS — Z9981 Dependence on supplemental oxygen: Secondary | ICD-10-CM

## 2019-03-06 DIAGNOSIS — G4733 Obstructive sleep apnea (adult) (pediatric): Secondary | ICD-10-CM | POA: Diagnosis present

## 2019-03-06 DIAGNOSIS — Z86010 Personal history of colonic polyps: Secondary | ICD-10-CM

## 2019-03-06 DIAGNOSIS — Z87891 Personal history of nicotine dependence: Secondary | ICD-10-CM

## 2019-03-06 DIAGNOSIS — Z9049 Acquired absence of other specified parts of digestive tract: Secondary | ICD-10-CM

## 2019-03-06 DIAGNOSIS — Z8249 Family history of ischemic heart disease and other diseases of the circulatory system: Secondary | ICD-10-CM

## 2019-03-06 DIAGNOSIS — I8510 Secondary esophageal varices without bleeding: Secondary | ICD-10-CM | POA: Diagnosis present

## 2019-03-06 DIAGNOSIS — Z85820 Personal history of malignant melanoma of skin: Secondary | ICD-10-CM

## 2019-03-06 DIAGNOSIS — F419 Anxiety disorder, unspecified: Secondary | ICD-10-CM | POA: Diagnosis present

## 2019-03-06 DIAGNOSIS — Z825 Family history of asthma and other chronic lower respiratory diseases: Secondary | ICD-10-CM

## 2019-03-06 HISTORY — DX: Pseudocyst of pancreas: K86.3

## 2019-03-06 LAB — LIPASE, BLOOD: Lipase: 31 U/L (ref 11–51)

## 2019-03-06 LAB — CBC WITH DIFFERENTIAL/PLATELET
Abs Immature Granulocytes: 0.03 10*3/uL (ref 0.00–0.07)
Basophils Absolute: 0 10*3/uL (ref 0.0–0.1)
Basophils Relative: 0 %
Eosinophils Absolute: 0 10*3/uL (ref 0.0–0.5)
Eosinophils Relative: 1 %
HCT: 41.2 % (ref 39.0–52.0)
Hemoglobin: 13 g/dL (ref 13.0–17.0)
Immature Granulocytes: 1 %
Lymphocytes Relative: 8 %
Lymphs Abs: 0.4 10*3/uL — ABNORMAL LOW (ref 0.7–4.0)
MCH: 27.6 pg (ref 26.0–34.0)
MCHC: 31.6 g/dL (ref 30.0–36.0)
MCV: 87.5 fL (ref 80.0–100.0)
Monocytes Absolute: 0.4 10*3/uL (ref 0.1–1.0)
Monocytes Relative: 7 %
Neutro Abs: 4.3 10*3/uL (ref 1.7–7.7)
Neutrophils Relative %: 83 %
Platelets: 127 10*3/uL — ABNORMAL LOW (ref 150–400)
RBC: 4.71 MIL/uL (ref 4.22–5.81)
RDW: 14 % (ref 11.5–15.5)
WBC: 5.1 10*3/uL (ref 4.0–10.5)
nRBC: 0 % (ref 0.0–0.2)

## 2019-03-06 LAB — COMPREHENSIVE METABOLIC PANEL
ALT: 14 U/L (ref 0–44)
AST: 15 U/L (ref 15–41)
Albumin: 3 g/dL — ABNORMAL LOW (ref 3.5–5.0)
Alkaline Phosphatase: 84 U/L (ref 38–126)
Anion gap: 10 (ref 5–15)
BUN: 19 mg/dL (ref 8–23)
CO2: 25 mmol/L (ref 22–32)
Calcium: 9 mg/dL (ref 8.9–10.3)
Chloride: 100 mmol/L (ref 98–111)
Creatinine, Ser: 1.26 mg/dL — ABNORMAL HIGH (ref 0.61–1.24)
GFR calc Af Amer: >60 mL/min (ref >60)
GFR calc non Af Amer: 59 mL/min — ABNORMAL LOW (ref >60)
Glucose, Bld: 132 mg/dL — ABNORMAL HIGH (ref 70–99)
Potassium: 3.7 mmol/L (ref 3.5–5.1)
Sodium: 135 mmol/L (ref 135–145)
Total Bilirubin: 0.5 mg/dL (ref 0.3–1.2)
Total Protein: 6.6 g/dL (ref 6.5–8.1)

## 2019-03-06 LAB — CBG MONITORING, ED: Glucose-Capillary: 123 mg/dL — ABNORMAL HIGH (ref 70–99)

## 2019-03-06 MED ORDER — ACETAMINOPHEN 325 MG PO TABS
650.0000 mg | ORAL_TABLET | Freq: Four times a day (QID) | ORAL | Status: DC | PRN
  Administered 2019-03-08 – 2019-03-11 (×4): 650 mg via ORAL
  Filled 2019-03-06 (×4): qty 2

## 2019-03-06 MED ORDER — SODIUM CHLORIDE 0.9 % IV BOLUS
1000.0000 mL | Freq: Once | INTRAVENOUS | Status: AC
  Administered 2019-03-06: 1000 mL via INTRAVENOUS

## 2019-03-06 MED ORDER — IOHEXOL 300 MG/ML  SOLN
100.0000 mL | Freq: Once | INTRAMUSCULAR | Status: AC | PRN
  Administered 2019-03-06: 100 mL via INTRAVENOUS

## 2019-03-06 MED ORDER — OXYCODONE HCL 5 MG PO TABS: 5.0000 mg | ORAL_TABLET | ORAL | Status: DC | PRN

## 2019-03-06 MED ORDER — ONDANSETRON HCL 4 MG/2ML IJ SOLN
4.0000 mg | Freq: Four times a day (QID) | INTRAMUSCULAR | Status: DC | PRN
  Administered 2019-03-07: 4 mg via INTRAVENOUS
  Filled 2019-03-06: qty 2

## 2019-03-06 MED ORDER — HYDROMORPHONE HCL 1 MG/ML IJ SOLN
0.5000 mg | INTRAMUSCULAR | Status: DC | PRN
  Administered 2019-03-06 (×4): 0.5 mg via INTRAVENOUS
  Filled 2019-03-06 (×4): qty 1

## 2019-03-06 MED ORDER — HYDROMORPHONE HCL 1 MG/ML IJ SOLN
1.0000 mg | INTRAMUSCULAR | Status: DC | PRN
  Administered 2019-03-06 – 2019-03-07 (×3): 1 mg via INTRAVENOUS
  Filled 2019-03-06 (×3): qty 1

## 2019-03-06 MED ORDER — SODIUM CHLORIDE 0.9 % IV SOLN: INTRAVENOUS | Status: DC

## 2019-03-06 MED ORDER — SODIUM CHLORIDE 0.9 % IV SOLN: Freq: Once | INTRAVENOUS | Status: AC

## 2019-03-06 MED ORDER — ONDANSETRON HCL 4 MG PO TABS: 4.0000 mg | ORAL_TABLET | Freq: Four times a day (QID) | ORAL | Status: DC | PRN

## 2019-03-06 MED ORDER — OXYCODONE HCL 5 MG PO TABS
10.0000 mg | ORAL_TABLET | ORAL | Status: DC | PRN
  Administered 2019-03-06 – 2019-03-11 (×22): 10 mg via ORAL
  Filled 2019-03-06 (×22): qty 2

## 2019-03-06 MED ORDER — ONDANSETRON HCL 4 MG/2ML IJ SOLN
4.0000 mg | Freq: Four times a day (QID) | INTRAMUSCULAR | Status: DC | PRN
  Administered 2019-03-06 (×2): 4 mg via INTRAVENOUS
  Filled 2019-03-06 (×2): qty 2

## 2019-03-06 MED ORDER — ACETAMINOPHEN 650 MG RE SUPP: 650.0000 mg | Freq: Four times a day (QID) | RECTAL | Status: DC | PRN

## 2019-03-06 NOTE — ED Notes (Signed)
Pt transported to CT. Reports mild improvement in pain after dilaudid.  No active vomiting

## 2019-03-06 NOTE — Telephone Encounter (Signed)
FYI

## 2019-03-06 NOTE — ED Provider Notes (Signed)
[facility]ERGENCY DEPARTMENT Provider Note   CSN: [id number] Arrival date & time: 03/06/19  1107     History Chief Complaint  Patient presents with  . Covid Positive  . Abdominal Pain    Steve Anthony is a 69 y.o. male.chronic bronchitis, cirrhosis without ascites, CKD stage IIIa, A. fib on Eliquis, HTN, T2DM, HLD, history of COVID-19 infection recent admission for pancreatitis with presenting to ER with worsening abdominal pain, nausea.  Pain has been relatively constant since discharge 10 days ago.  Seems to be worsening over the past 24 to 48 hours.  Also having worsening nausea, some vomiting.  Nonbloody nonbilious.  Pain is worse in his left upper abdomen, nonradiating.  Seen in GI clinic this morning, Berniece Pap, PA referred to ER for further eval, concern for recurrent pancreatitis.   HPI     Past Medical History:  Diagnosis Date  . Arthritis   . CAD (coronary artery disease)    a. S/P PCI mid LAD (vision stent) 08/31/2004;  b. repeat cath 6/11 - no progression of CAD. c. Cath 10/2012 given moderate CAD on cardiac CT - no change from prior cath.  . Chest pain 12/16/2016  . COPD (chronic obstructive pulmonary disease) ([place])   . COVID-19 virus infection   . Diverticulitis   . DJD (degenerative joint disease) of lumbar spine   . Dyslipidemia   . Gallstones   . GERD (gastroesophageal reflux disease)   . History of kidney stones   . Hypertension   . Hyperthyroidism   . Melanoma ([place])    melanoma removed from neck   . On home oxygen therapy    "2L q hs" (11/08/2012)  . OSA (obstructive sleep apnea)    a. cpap noncompliance- patient reports on 12/10/14- does not use CPAP  . Palpitations    a. 10/2012 - s/p LINQ loop recorder to assess for arrhythmia.   . Pancreatitis   . Paroxysmal A-fib ([place])    a. S/P PVI 2/11 and 9/11  . SVT (supraventricular tachycardia) ([place])    a. Atypical AVNRT of the slow pathway  . Type II diabetes mellitus [facility])      Patient Active Problem List   Diagnosis Date Noted  . Acute pancreatitis 02/20/2019  . CKD (chronic kidney disease), stage III 02/20/2019  . History of COVID-19 02/20/2019  . Abnormal CT of the abdomen 05/27/2018  . Recurrent pancreatitis 05/27/2018  . Idiopathic acute pancreatitis 04/14/2018  . Abnormal MRI of abdomen 04/14/2018  . Pancreatic cyst 04/14/2018  . Cirrhosis of liver without ascites ([place]) 04/14/2018  . Chest pain 12/16/2016  . Pure hypercholesterolemia   . S/P coronary artery stent placement   . Long term (current) use of anticoagulants [Z79.01] 06/10/2015  . OA (osteoarthritis) of hip 12/18/2014  . Coronary atherosclerosis 12/31/2009  . Sleep apnea 05/17/2009  . C O P D 05/15/2009  . SHORTNESS OF BREATH 03/19/2009  . Atrial fibrillation ([place]) 11/21/2008  . CHEST PAIN UNSPECIFIED 11/21/2008  . HYPERTRIGLYCERIDEMIA 10/15/2008  . DYSLIPIDEMIA 10/15/2008  . Essential hypertension 10/15/2008  . Unstable angina ([place]) 10/15/2008  . GERD 10/15/2008  . NEPHROLITHIASIS 10/15/2008  . PALPITATIONS 10/15/2008    Past Surgical History:  Procedure Laterality Date  . ATRIAL ABLATION SURGERY  2007   S/P slow pathway ablation for atypical AVNRT   . BIOPSY  07/17/2018   Procedure: BIOPSY;  Surgeon: Rush Landmark Telford Nab., MD;  Location: [facility];  Service: Gastroenterology;;  . [facility]  ~ 2008  .  COLON RESECTION  02/2013   . COLONOSCOPY  12/25/2007   Colonic polyp status post polypectomy. Internal hemorrhoids.   . CORONARY ANGIOPLASTY WITH STENT PLACEMENT  08/30/2004   Vision; to LAD   . CYSTOSCOPY W/ STONE MANIPULATION  ~ 1998   "once" (11/08/2012)  . EP Study  2008   Negative EP study in [place]  . ESOPHAGOGASTRODUODENOSCOPY (EGD) WITH PROPOFOL N/A 07/17/2018   Procedure: ESOPHAGOGASTRODUODENOSCOPY (EGD) WITH PROPOFOL;  Surgeon: Rush Landmark Telford Nab., MD;  Location: [facility];  Service: Gastroenterology;  Laterality: N/A;  . FRACTURE SURGERY    .  LEFT HEART CATHETERIZATION WITH CORONARY ANGIOGRAM N/A 11/10/2012   Procedure: LEFT HEART CATHETERIZATION WITH CORONARY ANGIOGRAM;  Surgeon: Burnell Blanks, MD;  Location: [facility];  Service: Cardiovascular;  Laterality: N/A;  . LOOP RECORDER IMPLANT N/A 11/10/2012   Medtronic LinQ implanted by Dr Rayann Heman for afib management  . [place] SURGERY  2000; 2002  . POSTERIOR LUMBAR FUSION  2004  . PROSTATE SURGERY  ~ 2009  . PVI/ CTI ablation  02/25/2009 and 09/2009   S/P afib/ CTI ablation  . TIBIA FRACTURE SURGERY Right 1990   "broke in 3 places; had rod put in" (11/08/2012)  . TIBIA HARDWARE REMOVAL Right ~ 1991  . TONSILLECTOMY  1965  . TOTAL HIP ARTHROPLASTY Right 12/18/2014   Procedure: RIGHT TOTAL HIP ARTHROPLASTY ANTERIOR APPROACH;  Surgeon: Gaynelle Arabian, MD;  Location: [facility];  Service: Orthopedics;  Laterality: Right;  . UPPER ESOPHAGEAL ENDOSCOPIC ULTRASOUND (EUS) N/A 07/17/2018   Procedure: UPPER ESOPHAGEAL ENDOSCOPIC ULTRASOUND (EUS);  Surgeon: Irving Copas., MD;  Location: [facility];  Service: Gastroenterology;  Laterality: N/A;       Family History  Problem Relation Age of Onset  . Emphysema Mother   . Heart attack Father        CVA  . Hypertension Father   . Diabetes Father   . Heart disease Father        MI  . Emphysema Maternal Grandfather        Smoker  . Heart disease Paternal Grandfather   . Colon cancer Neg Hx   . Esophageal cancer Neg Hx   . Inflammatory bowel disease Neg Hx   . Liver disease Neg Hx   . Pancreatic cancer Neg Hx   . Rectal cancer Neg Hx   . Stomach cancer Neg Hx     Social History   Tobacco Use  . Smoking status: Former Smoker    Packs/day: 3.00    Years: 38.00    Pack years: 114.00    Types: Cigarettes    Quit date: 03/25/2004    Years since quitting: 14.9  . Smokeless tobacco: Former Systems developer    Types: Chew    Quit date: 04/25/2004  Substance Use Topics  . Alcohol use: Not Currently    Comment:  "quit drinking  in 1989"  . Drug use: No    Home Medications Prior to Admission medications   Medication Sig Start Date End Date Taking? Authorizing Provider  acetaminophen (TYLENOL) 325 MG tablet Take by mouth every 6 (six) hours as needed for headache.    [provider]  albuterol (VENTOLIN HFA) 108 (90 Base) MCG/ACT inhaler Inhale 2 puffs into the lungs every 4 (four) hours as needed. 01/28/19   [provider]  Cyanocobalamin (VITAMIN B-12 IJ) Inject 1,000 mcg as directed every 30 (thirty) days.    [provider]  docusate sodium (COLACE) 100 MG capsule Take 100  mg by mouth See admin instructions. Three times a week    [provider]  ELIQUIS 5 MG TABS tablet TAKE 1 TABLET BY MOUTH TWICE A DAY Patient taking differently: Take 5 mg by mouth 2 (two) times daily.  12/25/18   Allred, Jeneen Rinks, MD  escitalopram (LEXAPRO) 10 MG tablet Take 10 mg by mouth daily. 05/30/17   [provider]  fenofibrate 160 MG tablet Take 160 mg by mouth daily.      [provider]  ferrous sulfate 325 (65 FE) MG tablet Take 325 mg by mouth See admin instructions. Take 1 tablet 2 times a week    [provider]  furosemide (LASIX) 20 MG tablet Take 40 mg by mouth daily.  03/15/17   [provider]  gabapentin (NEURONTIN) 300 MG capsule Take 300 mg by mouth 2 (two) times daily.     [provider]  Melatonin 10 MG TABS Take 10 mg by mouth at bedtime.     [provider]  metFORMIN (GLUCOPHAGE) 500 MG tablet Take 500 mg by mouth 2 (two) times daily.    [provider]  metoprolol succinate (TOPROL-XL) 50 MG 24 hr tablet TAKE 1 TABLET BY MOUTH TWICE A DAY Patient taking differently: Take 50 mg by mouth 2 (two) times daily.     Thompson Grayer, MD  naloxone [facility]) 4 MG/0.1ML LIQD nasal spray kit Place 1 spray into the nose daily as needed (opioid overdose).    [provider]  nitroGLYCERIN (NITROSTAT) 0.4 MG SL tablet Place 1 tablet  (0.4 mg total) under the tongue every 5 (five) minutes x 3 doses as needed for chest pain. 12/17/16   Barrett, Evelene Croon, PA-C  OVER THE COUNTER MEDICATION Apply 1 application topically daily as needed (rash). OTC fanny cream    [provider]  oxyCODONE-acetaminophen (PERCOCET) 10-325 MG tablet Take 1 tablet by mouth every 4 (four) hours as needed for pain.     [provider]  OXYGEN Inhale 2 L into the lungs daily as needed (shortness of breath).     [provider]  pantoprazole (PROTONIX) 40 MG tablet TAKE 1 TABLET BY MOUTH TWICE A DAY Patient taking differently: Take 40 mg by mouth 2 (two) times daily.  10/09/18   Mansouraty, Telford Nab., MD  promethazine (PHENERGAN) 25 MG tablet Take 1 tablet (25 mg total) by mouth every 8 (eight) hours as needed for nausea or vomiting. 09/25/18   Jackquline Denmark, MD  spironolactone (ALDACTONE) 25 MG tablet Take 25 mg by mouth daily. 10/26/14   [provider]  Tamsulosin HCl (FLOMAX) 0.4 MG CAPS Take 0.4 mg by mouth daily.      [provider]  tiZANidine (ZANAFLEX) 4 MG tablet Take 4 mg by mouth 2 (two) times daily. In the evening 03/01/18   [provider]  traZODone (DESYREL) 50 MG tablet Take 100 mg by mouth at bedtime.  08/17/17   [provider]    Allergies    Penicillins  Review of Systems   Review of Systems  Constitutional: Positive for chills. Negative for fever.  HENT: Negative for ear pain and sore throat.   Eyes: Negative for pain and visual disturbance.  Respiratory: Negative for cough and shortness of breath.   Cardiovascular: Negative for chest pain and palpitations.  Gastrointestinal: Positive for abdominal pain, nausea and vomiting.  Genitourinary: Negative for dysuria and hematuria.  Musculoskeletal: Negative for arthralgias and back pain.  Skin: Negative for color  change and rash.  Neurological: Negative for seizures and syncope.  All other systems reviewed and are  negative.   Physical Exam Updated Vital Signs BP 111/76 (BP Location: Right Arm)   Pulse 70   Temp 98.1 F (36.7 C) (Oral)   Resp 14   Ht 6' (1.829 m)   Wt 103.9 kg   SpO2 93%   BMI 31.06 kg/m   Physical Exam Vitals and nursing note reviewed.  Constitutional:      Appearance: He is well-developed.  HENT:     Head: Normocephalic and atraumatic.  Eyes:     Conjunctiva/sclera: Conjunctivae normal.  Cardiovascular:     Rate and Rhythm: Normal rate and regular rhythm.     Heart sounds: No murmur.  Pulmonary:     Effort: Pulmonary effort is normal. No respiratory distress.     Breath sounds: Normal breath sounds.  Abdominal:     General: Bowel sounds are normal.     Palpations: Abdomen is soft.     Tenderness: There is abdominal tenderness in the epigastric area and left upper quadrant.  Musculoskeletal:     Cervical back: Neck supple.  Skin:    General: Skin is warm and dry.  Neurological:     General: No focal deficit present.     Mental Status: He is alert.  Psychiatric:        Mood and Affect: Mood normal.        Behavior: Behavior normal.     ED Results / Procedures / Treatments   Labs (all labs ordered are listed, but only abnormal results are displayed) Labs Reviewed  CBC WITH DIFFERENTIAL/PLATELET - Abnormal; Notable for the following components:      Result Value   Platelets 127 (*)    Lymphs Abs 0.4 (*)    All other components within normal limits  COMPREHENSIVE METABOLIC PANEL - Abnormal; Notable for the following components:   Glucose, Bld 132 (*)    Creatinine, Ser 1.26 (*)    Albumin 3.0 (*)    GFR calc non Af Amer 59 (*)    All other components within normal limits  LIPASE, BLOOD    EKG None  Radiology CT ABDOMEN PELVIS W CONTRAST  Addendum Date: 03/06/2019   ADDENDUM REPORT: 03/06/2019 14:08 ADDENDUM: The wall thickening involving the proximal descending colon is compatible with secondary involvement by the adjacent inflammatory changes.  Electronically Signed   By: Claudie Revering M.D.   On: 03/06/2019 14:08   Result Date: 03/06/2019 CLINICAL DATA:  Mid to left abdominal pain since discharge from the hospital in late January 2021. EXAM: CT ABDOMEN AND PELVIS WITH CONTRAST TECHNIQUE: Multidetector CT imaging of the abdomen and pelvis was performed using the standard protocol following bolus administration of intravenous contrast. CONTRAST:  127m OMNIPAQUE IOHEXOL 300 MG/ML  SOLN COMPARISON:  02/20/2019 FINDINGS: Lower chest: Normal sized heart. Clear lung bases. No lung nodules seen today. Hepatobiliary: Stable diffuse low density of the liver relative to the spleen with enlargement of the caudate lobe and lateral segment of the left lobe of the liver. Cholecystectomy clips. Pancreas: The previously demonstrated 9.5 x 8.1 cm cyst in the tail of the pancreas currently measures 12.0 x 8.7 cm in maximum dimensions. A small area of adjacent oval low density in the tail of the pancreas is unchanged. Mild peripancreatic soft tissue stranding with improvement with the exception of increased soft tissue stranding adjacent to the inferior aspect of the large cyst in the tail of  the pancreas with interval mild, ill-defined wall thickening of that portion of the cyst. The cyst is partially encasing the splenic artery and splenic veins. There is also a smaller cystic area lateral to the larger cyst, inferior to the spleen weight is has increased mildly in size with mildly increased surrounding wall thickness with enhancement and adjacent soft tissue stranding. This smaller cyst measures 1.6 cm in maximum diameter on coronal image number 45 series 5. Spleen: No significant change in diffuse enlargement of the spleen Adrenals/Urinary Tract: Normal appearing adrenal glands. Previously demonstrated bilateral renal cysts. Unremarkable urinary bladder and ureters. Stomach/Bowel: Proximal sigmoid colon anastomosis. Mild soft tissue thickening involving the walls of the  proximal descending colon adjacent to the soft tissue stranding associated with the inferior portion of the large pancreatic tail cyst. Multiple diverticula at the junction of the descending colon and sigmoid colon. Normal appearing appendix. Unremarkable stomach and small bowel. Vascular/Lymphatic: Large number of upper abdominal varices. Atheromatous calcifications without aneurysm. No enlarged lymph nodes. Reproductive: Mildly enlarged prostate gland containing coarse calcifications. Other: Tiny umbilical hernia containing fat. Small right hydrocele. Musculoskeletal: Right hip prosthesis. L5-S1 interbody and pedicle screw and rod fusion. Lumbar and lower thoracic spine degenerative changes. IMPRESSION: 1. Interval mild increase in size of a large pancreatic tail pseudocyst with mildly increased surrounding soft tissue stranding and wall thickening inferiorly. This is compatible with interval inflammatory/infectious changes involving the inferior portion of the cyst and an adjacent 1.6 cm component. 2. Stable changes of cirrhosis of the liver with portal venous hypertension, including splenomegaly and large number of upper abdominal varices. 3. Colonic diverticulosis. 4. Small right hydrocele. Electronically Signed: By: Claudie Revering M.D. On: 03/06/2019 13:34    Procedures Procedures (including critical care time)  Medications Ordered in ED Medications  HYDROmorphone (DILAUDID) injection 0.5 mg (0.5 mg Intravenous Given 03/06/19 1155)  ondansetron (ZOFRAN) injection 4 mg (4 mg Intravenous Given 03/06/19 1153)  iohexol (OMNIPAQUE) 300 MG/ML solution 100 mL (100 mLs Intravenous Contrast Given 03/06/19 1252)  sodium chloride 0.9 % bolus 1,000 mL (0 mLs Intravenous Stopped 03/06/19 1348)    ED Course  I have reviewed the triage vital signs and the nursing notes.  Pertinent labs & imaging results that were available during my care of the patient were reviewed by me and considered in my medical decision making  (see chart for details).  Clinical Course as of Mar 05 1437  Tue Mar 06, 2019  1343 Discussed with GI PA Jaclyn Shaggy who agrees with plan for admission, she will discuss with Simona Huh who will evaluate patient when [facility]   [RD]    Clinical Course User Index [RD] Lucrezia Starch, MD   MDM Rules/Calculators/A&P                      69 year old male who presents to ER with recurrent abdominal pain, nausea in setting of recent admission for idiopathic pancreatitis with pseudocyst formation.  On exam noted to have tender abdomen, otherwise was well-appearing with stable vital signs.  Labs were relatively reassuring.  However, CT scan demonstrated persistent pancreatitis, worsening pseudocyst formation.  Given his significant nausea, pain, CT findings, discussion with GI, believe he would benefit from admission for further management.  Will consult hospitalist for admission.   Final Clinical Impression(s) / ED Diagnoses Final diagnoses:  Acute pancreatitis, unspecified complication status, unspecified pancreatitis type    Rx / DC Orders ED Discharge Orders    None  Lucrezia Starch, MD 03/06/19 [phone]

## 2019-03-06 NOTE — ED Notes (Signed)
Report given to Shanon Brow with Karen Chafe

## 2019-03-06 NOTE — Progress Notes (Signed)
03/06/2019 TRAVERS GOODLEY [id number] February 07, 1950   Chief Complaint:  Abdominal pain, pancreatic pseudocyst   History of Present Illness: Steve Anthony is a 69 y.o. male with a past medical history significant for CAD, s/p  coronary stent x 1 2006, paroxysmal A. Fib on Eliquis, arthritis, DJD, chronic lower back pain, COPD, hyperlipidemia, GERD, hypertension, hyperlipidemia, hypertriglyceridemia, diabetes, CKD stage III, sleep apnea (he does not use Cpap), past alcohol abuse, cryptogenic cirrhosis.ascites, idiopathic pancreatitis with a pancreatic pseudocyst. COVID 19+  Tested at [facility] department on 12/30. He and a mild cough and headache.  His wife was also Covid positive.  He was in quarantine until 02/14/2019.  Past. cholecystectomy.  History of acute recurrent  acute pancreatitis with a symptomatic pseudocyst which required multiple admissions to [facility] in 2019 and 2020.  He was seen in our office by Dr. Rush Landmark on 04/11/2018 for consideration of a cyst gastrostomy.  He underwent an EGD with EUS on 07/17/2018.  See results below.  Following the EGD/EUS, it was decided by Dr. Rush Landmark to hold off on an endoscopic cyst aspiration/gastrostomy.  He discussed there are blood vessels that surround the near entirety of the superior aspect of the cyst wall making FNA very difficult therefore tissue sampling was not obtained.  Based on findings of debris within the cyst this makes IPMN much less likely and more likely this is a walled off necrosis versus previous pseudocyst.  It was decided to monitor the pseudocyst at that point.  EGD and EUS by Dr. Rush Landmark 07/17/2018: - No gross lesions in upper esophagus. - Bleb found in the mid-esophagus. - Grade I and small (< 5 mm) esophageal varices distally. - Z-line regular, 41 cm from the incisors. - Portal hypertensive gastropathy in proximal stomach. - Gastritis. Biopsied for HP. - No gross lesions in the duodenal bulb, in  the first portion of the duodenum and in the second portion of the duodenum. - Duodenal diverticulum at region of Major ampulla - Ampulla is not within diverticulum. - Duodenal diverticulum at region of Anthony ampulla - Unable to be visualized completely. EUS Impression: - A cystic lesion was seen in the pancreatic tail. Tissue has not been obtained. However, the endosonographic appearance is consistent with a previous pseudocyst with possible debris within this. This is not characteristic of an IPMN. There are blood vessels that surround the near entirety of the superior aspect of the cyst wall, making FNA very difficult. I think there may be a small window, however, the patient has done extremely well in the last nearly 22-month without any recurrence of issues, that at this moment he would potentially be at a greater risk of possible complication to go after. I held off on sampling today. - Pancreatic parenchymal abnormalities consisting of atrophy and diffuse heterogeneity were noted in the entire pancreas - not characteristic of chronic pancreatitis. - The pancreatic duct had a normal endosonographic appearance in the pancreatic head, genu of the pancreas, body of the pancreas and tail of the pancreas. - There was dilation in the common bile duct. - No malignant-appearing lymph nodes were visualized in the celiac region (level 20), perigastric region and peripancreatic region.  He developed abdominal pain on 02/18/2019 which progressively worsened over the next few days.  He was unable to eat solid food and his fluid intake was inadequate.  [facility]2019 for further evaluation.  Laboratory studies showed an elevated creatinine level  1.66.  Lipase 212.  LFTs were normal.  Total bili 1.2.  Triglycerides 276.  WBC 4.6.  Hemoglobin 14.4.  Platelets 68.  An abdominal/pelvic CT with contrast identified acute pancreatitis without evidence of  pancreatic necrosis. An enlarging well-formed pseudocyst in the pancreatic tail extending to the splenic hilum measured 9.5 x 8.0 cm and previously measured 7.5 x 6.5 cm.  Stable cirrhotic changes involving the liver with portal venous hypertension, portal venous collaterals and splenomegaly were also noted.  He received aggressive IV fluid resuscitation.  His pain was controlled with Dilaudid and Norco.  Overall, his symptoms and laboratory studies stabilized and he was discharged home on 02/22/2019 with instructions to follow-up with Dr. Lyndel Safe.  He presents to our outpatient office today with complaints of 10 out of 10 left mid abdominal pain which radiates across his abdomen.  He stated this is the same pain he had when diagnosed with acute pancreatitis.  He reported having a fever of 101 yesterday.  He complains of nausea without vomiting.  He is unable to eat any solid food for the past 48 hours.  He drank 32 to 40 ounces of water and Gatorade yesterday.  Today  his water intake is low, less than 10 ounces.  He urinated 2 or 3 times this morning but notes his urine is darker in color. He last passed a soft brown stool yesterday. No rectal bleeding or melena.   Abdominal/lpelvic CT 02/20/2019: 1. CT findings consistent with acute pancreatitis. No findings for pancreatic necrosis. 2. Enlarging pancreatic pseudocyst. 3. Stable cirrhotic changes involving the liver with portal venous hypertension, portal venous collaterals and splenomegaly. No focal hepatic lesions. 4. Stable small left basilar pulmonary nodules.  ColonoscopyNovember 2009 Dr. Lyndel Safe:  multiple sigmoid diverticuli one 4 mm sessile polyp in the cecum was removed and noted small internal hemorrhoids as well as pandiverticulosis.  Path on the polyp consistent with unremarkable colonic mucosal lymphoid aggregate.  Past Medical History:  Diagnosis Date  . Arthritis   . CAD (coronary artery disease)    a. S/P PCI mid LAD (vision stent)  08/31/2004;  b. repeat cath 6/11 - no progression of CAD. c. Cath 10/2012 given moderate CAD on cardiac CT - no change from prior cath.  . Chest pain 12/16/2016  . COPD (chronic obstructive pulmonary disease) ([place])   . COVID-19 virus infection   . Diverticulitis   . DJD (degenerative joint disease) of lumbar spine   . Dyslipidemia   . Gallstones   . GERD (gastroesophageal reflux disease)   . History of kidney stones   . Hypertension   . Hyperthyroidism   . Melanoma ([place])    melanoma removed from neck   . On home oxygen therapy    "2L q hs" (11/08/2012)  . OSA (obstructive sleep apnea)    a. cpap noncompliance- patient reports on 12/10/14- does not use CPAP  . Palpitations    a. 10/2012 - s/p LINQ loop recorder to assess for arrhythmia.   . Pancreatitis   . Paroxysmal A-fib ([place])    a. S/P PVI 2/11 and 9/11  . SVT (supraventricular tachycardia) ([place])    a. Atypical AVNRT of the slow pathway  . Type II diabetes mellitus ([place])    Past Surgical History:  Procedure Laterality Date  . ATRIAL ABLATION SURGERY  2007   S/P slow pathway ablation for atypical AVNRT   . BIOPSY  07/17/2018   Procedure: BIOPSY;  Surgeon: Rush Landmark Telford Nab., MD;  Location: [place];  Service: Gastroenterology;;  . [facility]  ~ 2008  . COLON RESECTION  02/2013   . COLONOSCOPY  12/25/2007   Colonic polyp status post polypectomy. Internal hemorrhoids.   . CORONARY ANGIOPLASTY WITH STENT PLACEMENT  08/30/2004   Vision; to LAD   . CYSTOSCOPY W/ STONE MANIPULATION  ~ 1998   "once" (11/08/2012)  . EP Study  2008   Negative EP study in [place]  . ESOPHAGOGASTRODUODENOSCOPY (EGD) WITH PROPOFOL N/A 07/17/2018   Procedure: ESOPHAGOGASTRODUODENOSCOPY (EGD) WITH PROPOFOL;  Surgeon: Rush Landmark Telford Nab., MD;  Location: [place];  Service: Gastroenterology;  Laterality: N/A;  . FRACTURE SURGERY    . LEFT HEART CATHETERIZATION WITH CORONARY ANGIOGRAM N/A 11/10/2012   Procedure: LEFT HEART  CATHETERIZATION WITH CORONARY ANGIOGRAM;  Surgeon: Burnell Blanks, MD;  [facility] CATH LAB;  Service: Cardiovascular;  Laterality: N/A;  . LOOP RECORDER IMPLANT N/A 11/10/2012   Medtronic LinQ implanted by Dr Rayann Heman for afib management  . [place] SURGERY  2000; 2002  . POSTERIOR LUMBAR FUSION  2004  . PROSTATE SURGERY  ~ 2009  . PVI/ CTI ablation  02/25/2009 and 09/2009   S/P afib/ CTI ablation  . TIBIA FRACTURE SURGERY Right 1990   "broke in 3 places; had rod put in" (11/08/2012)  . TIBIA HARDWARE REMOVAL Right ~ 1991  . TONSILLECTOMY  1965  . TOTAL HIP ARTHROPLASTY Right 12/18/2014   Procedure: RIGHT TOTAL HIP ARTHROPLASTY ANTERIOR APPROACH;  Surgeon: Gaynelle Arabian, MD;  Location: [facility];  Service: Orthopedics;  Laterality: Right;  . UPPER ESOPHAGEAL ENDOSCOPIC ULTRASOUND (EUS) N/A 07/17/2018   Procedure: UPPER ESOPHAGEAL ENDOSCOPIC ULTRASOUND (EUS);  Surgeon: Irving Copas., MD;  Location: [place];  Service: Gastroenterology;  Laterality: N/A;   Current Outpatient Medications on File Prior to Visit  Medication Sig Dispense Refill  . acetaminophen (TYLENOL) 325 MG tablet Take by mouth every 6 (six) hours as needed for headache.    . albuterol (VENTOLIN HFA) 108 (90 Base) MCG/ACT inhaler Inhale 2 puffs into the lungs every 4 (four) hours as needed.    . Cyanocobalamin (VITAMIN B-12 IJ) Inject 1,000 mcg as directed every 30 (thirty) days.    Marland Kitchen docusate sodium (COLACE) 100 MG capsule Take 100 mg by mouth See admin instructions. Three times a week    . ELIQUIS 5 MG TABS tablet TAKE 1 TABLET BY MOUTH TWICE A DAY (Patient taking differently: Take 5 mg by mouth 2 (two) times daily. ) 180 tablet 1  . escitalopram (LEXAPRO) 10 MG tablet Take 10 mg by mouth daily.  1  . fenofibrate 160 MG tablet Take 160 mg by mouth daily.      . ferrous sulfate 325 (65 FE) MG tablet Take 325 mg by mouth See admin instructions. Take 1 tablet 2 times a week    . furosemide (LASIX) 20 MG  tablet Take 40 mg by mouth daily.     Marland Kitchen gabapentin (NEURONTIN) 300 MG capsule Take 300 mg by mouth 2 (two) times daily.     . Melatonin 10 MG TABS Take 10 mg by mouth at bedtime.     . metFORMIN (GLUCOPHAGE) 500 MG tablet Take 500 mg by mouth 2 (two) times daily.    . metoprolol succinate (TOPROL-XL) 50 MG 24 hr tablet TAKE 1 TABLET BY MOUTH TWICE A DAY (Patient taking differently: Take 50 mg by mouth 2 (two) times daily. ) 60 tablet 3  . naloxone (NARCAN) 4 MG/0.1ML LIQD nasal spray kit Place 1 spray into the  nose daily as needed (opioid overdose).    . nitroGLYCERIN (NITROSTAT) 0.4 MG SL tablet Place 1 tablet (0.4 mg total) under the tongue every 5 (five) minutes x 3 doses as needed for chest pain. 25 tablet 1  . OVER THE COUNTER MEDICATION Apply 1 application topically daily as needed (rash). OTC fanny cream    . oxyCODONE-acetaminophen (PERCOCET) 10-325 MG tablet Take 1 tablet by mouth every 4 (four) hours as needed for pain.     . OXYGEN Inhale 2 L into the lungs daily as needed (shortness of breath).     . pantoprazole (PROTONIX) 40 MG tablet TAKE 1 TABLET BY MOUTH TWICE A DAY (Patient taking differently: Take 40 mg by mouth 2 (two) times daily. ) 180 tablet 1  . promethazine (PHENERGAN) 25 MG tablet Take 1 tablet (25 mg total) by mouth every 8 (eight) hours as needed for nausea or vomiting. 30 tablet 2  . spironolactone (ALDACTONE) 25 MG tablet Take 25 mg by mouth daily.  1  . Tamsulosin HCl (FLOMAX) 0.4 MG CAPS Take 0.4 mg by mouth daily.      Marland Kitchen tiZANidine (ZANAFLEX) 4 MG tablet Take 4 mg by mouth 2 (two) times daily. In the evening    . traZODone (DESYREL) 50 MG tablet Take 100 mg by mouth at bedtime.      No current facility-administered medications on file prior to visit.   Allergies  Allergen Reactions  . Penicillins Hives and Shortness Of Breath    Did it involve swelling of the face/tongue/throat, SOB, or low BP? Yes Did it involve sudden or severe rash/hives, skin peeling, or  any reaction on the inside of your mouth or nose? No Did you need to seek medical attention at a hospital or doctor's office? Yes When did it last happen?40 + years If all above answers are "NO", may proceed with cephalosporin use.        Current Medications, Allergies, Past Medical History, Past Surgical History, Family History and Social History were reviewed in [employer] record.   Physical Exam: BP 102/78   Pulse 92   Temp (!) 97.1 F (36.2 C)   Ht 6' (1.829 m)   Wt 229 lb (103.9 kg)   BMI 31.06 kg/m  General: 69 year old male appears older than his stated age, fatigued but in no acute distress. Head: Normocephalic and atraumatic. Eyes:  No scleral icterus. Conjunctiva pink . Ears: Normal auditory acuity. Mouth: Upper and lower dentures.  No ulcers or lesions. Lungs: Clear throughout to auscultation. Heart: Regular rate and rhythm, no murmur. Abdomen: Soft, nondistended.  Moderate tenderness left upper and left mid abdomen which extends to his central abdomen without rebound or guarding.  Hypoactive bowel sounds to all 4 quadrants.  No HSM.  Midline abdominal scar intact. Rectal: Deferred. Musculoskeletal: Symmetrical with no gross deformities. Extremities: No edema. Neurological: Alert oriented x 4. No focal deficits.  Psychological:  Alert and cooperative. Normal mood and affect  Assessment and Recommendations:  72.  69 year old male with a history of recurrent acute pancreatitis with pseudocyst presents with 10 out of 10 left mid to generalized abdominal pain with decreased p.o. intake. -Patient was sent directly to [facility] ED for laboratory studies, IV hydration and repeat abdominal/pelvic CT.  Patient will most likely require direct admit to the hospital service at [facility] with GI consult.  Reconsideration of cyst gastrostomy in the setting of enlarging pancreatic pseudocyst.  2. Cirrhosis. Grade I esophageal  varices and mild portal hypertension gastropathy per EGD 06/2018. Hep B surf ag and Hep B surface ab negative 06/2018. Hep A total reactive. Hep C ab negative. ANA and AMA negative.  -Spironolactone and Lasix on hold  -Patient will need Hep B vaccine if not already administered  -Further follow up to be determined the above ED evaluation completed   3. History of colon polyps. Last colonoscopy in 11/2007 by Dr. Lyndel Safe. Patient previously declined further colonoscopies.   4. Atrial fibrillation on Eliquis. CAD s/p stent.   5. Splenomegaly secondary to cirrhosis  6. Thrombocytopenia secondary to cirrhosis   7. CKD   8. COVID 19 + 01/24/2019

## 2019-03-06 NOTE — ED Triage Notes (Signed)
Abdominal pain. Hx of pancreatitis. Covid Positive in January but is off quarantine per pt.

## 2019-03-06 NOTE — ED Notes (Signed)
ED Provider at bedside to discuss results and treatment plan

## 2019-03-06 NOTE — H&P (Signed)
History and Physical    Steve Anthony ZMC:[id number] DOB: Jul 26, 1950 DOA: 03/06/2019  PCP: [name], MD  Patient coming from: Home  Chief Complaint: Abdominal pain nausea vomiting  HPI: Steve Anthony is a 69 y.o. male with medical history significant of chronic pancreatitis, coronary disease, Covid infection in December 2020, pancreatic cyst, chronic back pain comes in with several days of epigastric pain and back pain with a lot of nausea and retching at home.  He does not drink alcohol at all.  He has not been vomiting blood.  He denies any fevers.  He denies any diarrhea.  Patient was sent to [facility] from the [place] for work-up.  His lipase is normal patient found to have an enlarging pancreatic cyst.  GI advised medical admission for them to see in the morning.  Patient is in a lot of pain right now as he is not had any of his oral pain meds since 830 this morning.  He reports he is not been given any IV pain meds either.  He reports Dilaudid works well for him.  He takes oxycodone 10 mg tablets every 4 hours at home.  Patient be referred for admission for acute on chronic pancreatitis episode.  Review of Systems: As per HPI otherwise 10 point review of systems negative.   Past Medical History:  Diagnosis Date  . Arthritis   . CAD (coronary artery disease)    a. S/P PCI mid LAD (vision stent) 08/31/2004;  b. repeat cath 6/11 - no progression of CAD. c. Cath 10/2012 given moderate CAD on cardiac CT - no change from prior cath.  . Chest pain 12/16/2016  . COPD (chronic obstructive pulmonary disease) ([place])   . COVID-19 virus infection   . Diverticulitis   . DJD (degenerative joint disease) of lumbar spine   . Dyslipidemia   . Gallstones   . GERD (gastroesophageal reflux disease)   . History of kidney stones   . Hypertension   . Hyperthyroidism   . Melanoma ([employer])    melanoma removed from neck   . On home oxygen therapy    "2L q hs" (11/08/2012)  . OSA  (obstructive sleep apnea)    a. cpap noncompliance- patient reports on 12/10/14- does not use CPAP  . Palpitations    a. 10/2012 - s/p LINQ loop recorder to assess for arrhythmia.   . Pancreatitis   . Paroxysmal A-fib ([place])    a. S/P PVI 2/11 and 9/11  . SVT (supraventricular tachycardia) ([place])    a. Atypical AVNRT of the slow pathway  . Type II diabetes mellitus ([place])     Past Surgical History:  Procedure Laterality Date  . ATRIAL ABLATION SURGERY  2007   S/P slow pathway ablation for atypical AVNRT   . BIOPSY  07/17/2018   Procedure: BIOPSY;  Surgeon: Rush Landmark Telford Nab., MD;  Location: [facility];  Service: Gastroenterology;;  . [facility]  ~ 2008  . COLON RESECTION  02/2013   . COLONOSCOPY  12/25/2007   Colonic polyp status post polypectomy. Internal hemorrhoids.   . CORONARY ANGIOPLASTY WITH STENT PLACEMENT  08/30/2004   Vision; to LAD   . CYSTOSCOPY W/ STONE MANIPULATION  ~ 1998   "once" (11/08/2012)  . EP Study  2008   Negative EP study in [place]  . ESOPHAGOGASTRODUODENOSCOPY (EGD) WITH PROPOFOL N/A 07/17/2018   Procedure: ESOPHAGOGASTRODUODENOSCOPY (EGD) WITH PROPOFOL;  Surgeon: Rush Landmark Telford Nab., MD;  Location: [facility];  Service: Gastroenterology;  Laterality: N/A;  . FRACTURE SURGERY    . LEFT HEART CATHETERIZATION WITH CORONARY ANGIOGRAM N/A 11/10/2012   Procedure: LEFT HEART CATHETERIZATION WITH CORONARY ANGIOGRAM;  Surgeon: Burnell Blanks, MD;  Location: [facility];  Service: Cardiovascular;  Laterality: N/A;  . LOOP RECORDER IMPLANT N/A 11/10/2012   Medtronic LinQ implanted by Dr Rayann Heman for afib management  . Brutus SURGERY  2000; 2002  . POSTERIOR LUMBAR FUSION  2004  . PROSTATE SURGERY  ~ 2009  . PVI/ CTI ablation  02/25/2009 and 09/2009   S/P afib/ CTI ablation  . TIBIA FRACTURE SURGERY Right 1990   "broke in 3 places; had rod put in" (11/08/2012)  . TIBIA HARDWARE REMOVAL Right ~ 1991  . TONSILLECTOMY  1965  . TOTAL HIP  ARTHROPLASTY Right 12/18/2014   Procedure: RIGHT TOTAL HIP ARTHROPLASTY ANTERIOR APPROACH;  Surgeon: Gaynelle Arabian, MD;  Location: [facility];  Service: Orthopedics;  Laterality: Right;  . UPPER ESOPHAGEAL ENDOSCOPIC ULTRASOUND (EUS) N/A 07/17/2018   Procedure: UPPER ESOPHAGEAL ENDOSCOPIC ULTRASOUND (EUS);  Surgeon: Irving Copas., MD;  Location: [facility];  Service: Gastroenterology;  Laterality: N/A;     reports that he quit smoking about 14 years ago. His smoking use included cigarettes. He has a 114.00 pack-year smoking history. He quit smokeless tobacco use about 14 years ago.  His smokeless tobacco use included chew. He reports previous alcohol use. He reports that he does not use drugs.  Allergies  Allergen Reactions  . Penicillins Hives and Shortness Of Breath    Did it involve swelling of the face/tongue/throat, SOB, or low BP? Yes Did it involve sudden or severe rash/hives, skin peeling, or any reaction on the inside of your mouth or nose? No Did you need to seek medical attention at a hospital or doctor's office? Yes When did it last happen?40 + years If all above answers are "NO", may proceed with cephalosporin use.      Family History  Problem Relation Age of Onset  . Emphysema Mother   . Heart attack Father        CVA  . Hypertension Father   . Diabetes Father   . Heart disease Father        MI  . Emphysema Maternal Grandfather        Smoker  . Heart disease Paternal Grandfather   . Colon cancer Neg Hx   . Esophageal cancer Neg Hx   . Inflammatory bowel disease Neg Hx   . Liver disease Neg Hx   . Pancreatic cancer Neg Hx   . Rectal cancer Neg Hx   . Stomach cancer Neg Hx     Prior to Admission medications   Medication Sig Start Date End Date Taking? Authorizing Provider  acetaminophen (TYLENOL) 325 MG tablet Take by mouth every 6 (six) hours as needed for headache.    [provider]  albuterol (VENTOLIN HFA) 108 (90 Base) MCG/ACT  inhaler Inhale 2 puffs into the lungs every 4 (four) hours as needed. 01/28/19   [provider]  Cyanocobalamin (VITAMIN B-12 IJ) Inject 1,000 mcg as directed every 30 (thirty) days.    [provider]  docusate sodium (COLACE) 100 MG capsule Take 100 mg by mouth See admin instructions. Three times a week    [provider]  ELIQUIS 5 MG TABS tablet TAKE 1 TABLET BY MOUTH TWICE A DAY Patient taking differently: Take 5 mg by mouth 2 (two) times daily.  12/25/18   Allred, Jeneen Rinks,  MD  escitalopram (LEXAPRO) 10 MG tablet Take 10 mg by mouth daily. 05/30/17   [provider]  fenofibrate 160 MG tablet Take 160 mg by mouth daily.      [provider]  ferrous sulfate 325 (65 FE) MG tablet Take 325 mg by mouth See admin instructions. Take 1 tablet 2 times a week    [provider]  furosemide (LASIX) 20 MG tablet Take 40 mg by mouth daily.  03/15/17   [provider]  gabapentin (NEURONTIN) 300 MG capsule Take 300 mg by mouth 2 (two) times daily.     [provider]  Melatonin 10 MG TABS Take 10 mg by mouth at bedtime.     [provider]  metFORMIN (GLUCOPHAGE) 500 MG tablet Take 500 mg by mouth 2 (two) times daily.    [provider]  metoprolol succinate (TOPROL-XL) 50 MG 24 hr tablet TAKE 1 TABLET BY MOUTH TWICE A DAY Patient taking differently: Take 50 mg by mouth 2 (two) times daily.     Thompson Grayer, MD  naloxone [facility]) 4 MG/0.1ML LIQD nasal spray kit Place 1 spray into the nose daily as needed (opioid overdose).    [provider]  nitroGLYCERIN (NITROSTAT) 0.4 MG SL tablet Place 1 tablet (0.4 mg total) under the tongue every 5 (five) minutes x 3 doses as needed for chest pain. 12/17/16   Barrett, Evelene Croon, PA-C  OVER THE COUNTER MEDICATION Apply 1 application topically daily as needed (rash). OTC fanny cream    [provider]  oxyCODONE-acetaminophen (PERCOCET) 10-325 MG tablet Take 1 tablet  by mouth every 4 (four) hours as needed for pain.     [provider]  OXYGEN Inhale 2 L into the lungs daily as needed (shortness of breath).     [provider]  pantoprazole (PROTONIX) 40 MG tablet TAKE 1 TABLET BY MOUTH TWICE A DAY Patient taking differently: Take 40 mg by mouth 2 (two) times daily.  10/09/18   Mansouraty, Telford Nab., MD  promethazine (PHENERGAN) 25 MG tablet Take 1 tablet (25 mg total) by mouth every 8 (eight) hours as needed for nausea or vomiting. 09/25/18   Jackquline Denmark, MD  spironolactone (ALDACTONE) 25 MG tablet Take 25 mg by mouth daily. 10/26/14   [provider]  Tamsulosin HCl (FLOMAX) 0.4 MG CAPS Take 0.4 mg by mouth daily.      [provider]  tiZANidine (ZANAFLEX) 4 MG tablet Take 4 mg by mouth 2 (two) times daily. In the evening 03/01/18   [provider]  traZODone (DESYREL) 50 MG tablet Take 100 mg by mouth at bedtime.  08/17/17   [provider]    Physical Exam: Vitals:   03/06/19 1742 03/06/19 1743 03/06/19 1928 03/06/19 2043  BP: 125/85 125/85 122/75 114/81  Pulse: 75 74 76 79  Resp:  20 18 18   Temp:   99.5 F (37.5 C) 98.7 F (37.1 C)  TempSrc:   Oral Oral  SpO2: 91% 93% 93% 92%  Weight:      Height:          Constitutional: NAD, calm, comfortable Vitals:   03/06/19 1742 03/06/19 1743 03/06/19 1928 03/06/19 2043  BP: 125/85 125/85 122/75 114/81  Pulse: 75 74 76 79  Resp:  20 18 18   Temp:   99.5 F (37.5 C) 98.7 F (37.1 C)  TempSrc:   Oral Oral  SpO2: 91% 93% 93% 92%  Weight:  Height:       Eyes: PERRL, lids and conjunctivae normal ENMT: Mucous membranes are moist. Posterior pharynx clear of any exudate or lesions.Normal dentition.  Neck: normal, supple, no masses, no thyromegaly Respiratory: clear to auscultation bilaterally, no wheezing, no crackles. Normal respiratory effort. No accessory muscle use.  Cardiovascular: Regular rate and rhythm, no murmurs / rubs / gallops. No  extremity edema. 2+ pedal pulses. No carotid bruits.  Abdomen: Epigastric tenderness, no masses palpated. No hepatosplenomegaly. Bowel sounds positive.  No rebound no guarding nonacute abdomen no distention. Musculoskeletal: no clubbing / cyanosis. No joint deformity upper and lower extremities. Good ROM, no contractures. Normal muscle tone.  Skin: no rashes, lesions, ulcers. No induration Neurologic: CN 2-12 grossly intact. Sensation intact, DTR normal. Strength 5/5 in all 4.  Psychiatric: Normal judgment and insight. Alert and oriented x 3. Normal mood.    Labs on Admission: I have personally reviewed following labs and imaging studies  CBC: Recent Labs  Lab 03/06/19 1149  WBC 5.1  NEUTROABS 4.3  HGB 13.0  HCT 41.2  MCV 87.5  PLT 037*   Basic Metabolic Panel: Recent Labs  Lab 03/06/19 1149  NA 135  K 3.7  CL 100  CO2 25  GLUCOSE 132*  BUN 19  CREATININE 1.26*  CALCIUM 9.0   GFR: Estimated Creatinine Clearance: 70.9 mL/min (A) (by C-G formula based on SCr of 1.26 mg/dL (H)). Liver Function Tests: Recent Labs  Lab 03/06/19 1149  AST 15  ALT 14  ALKPHOS 84  BILITOT 0.5  PROT 6.6  ALBUMIN 3.0*   Recent Labs  Lab 03/06/19 1149  LIPASE 31   No results for input(s): AMMONIA in the last 168 hours. Coagulation Profile: No results for input(s): INR, PROTIME in the last 168 hours. Cardiac Enzymes: No results for input(s): CKTOTAL, CKMB, CKMBINDEX, TROPONINI in the last 168 hours. BNP (last 3 results) No results for input(s): PROBNP in the last 8760 hours. HbA1C: No results for input(s): HGBA1C in the last 72 hours. CBG: Recent Labs  Lab 03/06/19 1937  GLUCAP 123*   Lipid Profile: No results for input(s): CHOL, HDL, LDLCALC, TRIG, CHOLHDL, LDLDIRECT in the last 72 hours. Thyroid Function Tests: No results for input(s): TSH, T4TOTAL, FREET4, T3FREE, THYROIDAB in the last 72 hours. Anemia Panel: No results for input(s): VITAMINB12, FOLATE, FERRITIN, TIBC,  IRON, RETICCTPCT in the last 72 hours. Urine analysis:    Component Value Date/Time   COLORURINE YELLOW 02/20/2019 1002   APPEARANCEUR CLEAR 02/20/2019 1002   LABSPEC 1.010 02/20/2019 1002   PHURINE 5.0 02/20/2019 1002   GLUCOSEU NEGATIVE 02/20/2019 1002   HGBUR NEGATIVE 02/20/2019 1002   [place] 02/20/2019 1002   [place] 02/20/2019 1002   PROTEINUR NEGATIVE 02/20/2019 1002   NITRITE NEGATIVE 02/20/2019 1002   LEUKOCYTESUR NEGATIVE 02/20/2019 1002   Sepsis Labs: !!!!!!!!!!!!!!!!!!!!!!!!!!!!!!!!!!!!!!!!!!!! @LABRCNTIP (procalcitonin:4,lacticidven:4) )No results found for this or any previous visit (from the past 240 hour(s)).   Radiological Exams on Admission: CT ABDOMEN PELVIS W CONTRAST  Addendum Date: 03/06/2019   ADDENDUM REPORT: 03/06/2019 14:08 ADDENDUM: The wall thickening involving the proximal descending colon is compatible with secondary involvement by the adjacent inflammatory changes. Electronically Signed   By: Claudie Revering M.D.   On: 03/06/2019 14:08   Result Date: 03/06/2019 CLINICAL DATA:  Mid to left abdominal pain since discharge from the hospital in late January 2021. EXAM: CT ABDOMEN AND PELVIS WITH CONTRAST TECHNIQUE: Multidetector CT imaging of the abdomen and pelvis was performed using the  standard protocol following bolus administration of intravenous contrast. CONTRAST:  180m OMNIPAQUE IOHEXOL 300 MG/ML  SOLN COMPARISON:  02/20/2019 FINDINGS: Lower chest: Normal sized heart. Clear lung bases. No lung nodules seen today. Hepatobiliary: Stable diffuse low density of the liver relative to the spleen with enlargement of the caudate lobe and lateral segment of the left lobe of the liver. Cholecystectomy clips. Pancreas: The previously demonstrated 9.5 x 8.1 cm cyst in the tail of the pancreas currently measures 12.0 x 8.7 cm in maximum dimensions. A small area of adjacent oval low density in the tail of the pancreas is unchanged. Mild peripancreatic  soft tissue stranding with improvement with the exception of increased soft tissue stranding adjacent to the inferior aspect of the large cyst in the tail of the pancreas with interval mild, ill-defined wall thickening of that portion of the cyst. The cyst is partially encasing the splenic artery and splenic veins. There is also a smaller cystic area lateral to the larger cyst, inferior to the spleen weight is has increased mildly in size with mildly increased surrounding wall thickness with enhancement and adjacent soft tissue stranding. This smaller cyst measures 1.6 cm in maximum diameter on coronal image number 45 series 5. Spleen: No significant change in diffuse enlargement of the spleen Adrenals/Urinary Tract: Normal appearing adrenal glands. Previously demonstrated bilateral renal cysts. Unremarkable urinary bladder and ureters. Stomach/Bowel: Proximal sigmoid colon anastomosis. Mild soft tissue thickening involving the walls of the proximal descending colon adjacent to the soft tissue stranding associated with the inferior portion of the large pancreatic tail cyst. Multiple diverticula at the junction of the descending colon and sigmoid colon. Normal appearing appendix. Unremarkable stomach and small bowel. Vascular/Lymphatic: Large number of upper abdominal varices. Atheromatous calcifications without aneurysm. No enlarged lymph nodes. Reproductive: Mildly enlarged prostate gland containing coarse calcifications. Other: Tiny umbilical hernia containing fat. Small right hydrocele. Musculoskeletal: Right hip prosthesis. L5-S1 interbody and pedicle screw and rod fusion. Lumbar and lower thoracic spine degenerative changes. IMPRESSION: 1. Interval mild increase in size of a large pancreatic tail pseudocyst with mildly increased surrounding soft tissue stranding and wall thickening inferiorly. This is compatible with interval inflammatory/infectious changes involving the inferior portion of the cyst and an  adjacent 1.6 cm component. 2. Stable changes of cirrhosis of the liver with portal venous hypertension, including splenomegaly and large number of upper abdominal varices. 3. Colonic diverticulosis. 4. Small right hydrocele. Electronically Signed: By: SClaudie ReveringM.D. On: 03/06/2019 13:34   Old chart reviewed  Assessment/Plan 69year old male with acute on chronic pancreatitis with enlarging pseudocyst of pancreas Principal Problem:   Pseudocyst of pancreas-GI to see in the morning.  Does not appear infected.  Further management recommendations per GI team.  Active Problems:   Recurrent pancreatitis-IV fluids.  Conservative management n.p.o.  Restarted him on his oxycodone for milligrams every 4 hours along with Dilaudid as needed for severe pain.  As needed Zofran ordered.    COPD without exacerbation (HCC)-stable at this time    Cirrhosis of liver without ascites (HCC)-stable    CKD (chronic kidney disease), stage III-at baseline    History of COVID-19 01/24/19-noted     DVT prophylaxis: SCDs Code Status: Full Family Communication: None Disposition Plan: 1 to 3 days Consults called: GI Admission status: Admission   Torrence Hammack A MD [facility]  If 7PM-7AM, please contact night-coverage [email or web address] Password [email or web address] 03/06/2019, 9:29 PM

## 2019-03-06 NOTE — ED Notes (Signed)
Pt transferred to [facility] via [employer] 

## 2019-03-06 NOTE — Telephone Encounter (Signed)
Pt called again looking for an appt today. Colleen had appt available today. Per Olivia Mackie it was ok to schedule pt to see her. Pt has an appt at 10:00 am this morning to see her.

## 2019-03-07 DIAGNOSIS — K863 Pseudocyst of pancreas: Principal | ICD-10-CM

## 2019-03-07 DIAGNOSIS — K7460 Unspecified cirrhosis of liver: Secondary | ICD-10-CM

## 2019-03-07 DIAGNOSIS — R1012 Left upper quadrant pain: Secondary | ICD-10-CM

## 2019-03-07 LAB — COMPREHENSIVE METABOLIC PANEL
ALT: 13 U/L (ref 0–44)
AST: 10 U/L — ABNORMAL LOW (ref 15–41)
Albumin: 2.7 g/dL — ABNORMAL LOW (ref 3.5–5.0)
Alkaline Phosphatase: 66 U/L (ref 38–126)
Anion gap: 8 (ref 5–15)
BUN: 13 mg/dL (ref 8–23)
CO2: 26 mmol/L (ref 22–32)
Calcium: 8.3 mg/dL — ABNORMAL LOW (ref 8.9–10.3)
Chloride: 105 mmol/L (ref 98–111)
Creatinine, Ser: 1.02 mg/dL (ref 0.61–1.24)
GFR calc Af Amer: >60 mL/min (ref >60)
GFR calc non Af Amer: >60 mL/min (ref >60)
Glucose, Bld: 125 mg/dL — ABNORMAL HIGH (ref 70–99)
Potassium: 3.9 mmol/L (ref 3.5–5.1)
Sodium: 139 mmol/L (ref 135–145)
Total Bilirubin: 1 mg/dL (ref 0.3–1.2)
Total Protein: 5.9 g/dL — ABNORMAL LOW (ref 6.5–8.1)

## 2019-03-07 LAB — CBC
HCT: 35 % — ABNORMAL LOW (ref 39.0–52.0)
Hemoglobin: 10.9 g/dL — ABNORMAL LOW (ref 13.0–17.0)
MCH: 27.7 pg (ref 26.0–34.0)
MCHC: 31.1 g/dL (ref 30.0–36.0)
MCV: 89.1 fL (ref 80.0–100.0)
Platelets: 93 10*3/uL — ABNORMAL LOW (ref 150–400)
RBC: 3.93 MIL/uL — ABNORMAL LOW (ref 4.22–5.81)
RDW: 14.1 % (ref 11.5–15.5)
WBC: 3.3 10*3/uL — ABNORMAL LOW (ref 4.0–10.5)
nRBC: 0 % (ref 0.0–0.2)

## 2019-03-07 LAB — GLUCOSE, CAPILLARY
Glucose-Capillary: 148 mg/dL — ABNORMAL HIGH (ref 70–99)
Glucose-Capillary: 108 mg/dL — ABNORMAL HIGH (ref 70–99)

## 2019-03-07 LAB — PROTIME-INR
INR: 1.6 — ABNORMAL HIGH (ref 0.8–1.2)
Prothrombin Time: 19.4 seconds — ABNORMAL HIGH (ref 11.4–15.2)

## 2019-03-07 MED ORDER — MELATONIN 5 MG PO TABS
10.0000 mg | ORAL_TABLET | Freq: Every day | ORAL | Status: DC
  Administered 2019-03-07 – 2019-03-10 (×4): 10 mg via ORAL
  Filled 2019-03-07 (×4): qty 2

## 2019-03-07 MED ORDER — TAMSULOSIN HCL 0.4 MG PO CAPS
0.4000 mg | ORAL_CAPSULE | Freq: Every day | ORAL | Status: DC
  Administered 2019-03-07 – 2019-03-11 (×5): 0.4 mg via ORAL
  Filled 2019-03-07 (×5): qty 1

## 2019-03-07 MED ORDER — GABAPENTIN 300 MG PO CAPS
300.0000 mg | ORAL_CAPSULE | Freq: Two times a day (BID) | ORAL | Status: DC
  Administered 2019-03-07 – 2019-03-11 (×9): 300 mg via ORAL
  Filled 2019-03-07 (×9): qty 1

## 2019-03-07 MED ORDER — TIZANIDINE HCL 4 MG PO TABS
4.0000 mg | ORAL_TABLET | Freq: Two times a day (BID) | ORAL | Status: DC
  Administered 2019-03-07 – 2019-03-11 (×9): 4 mg via ORAL
  Filled 2019-03-07 (×9): qty 1

## 2019-03-07 MED ORDER — HYDROMORPHONE HCL 1 MG/ML IJ SOLN
1.0000 mg | INTRAMUSCULAR | Status: DC | PRN
  Administered 2019-03-07 – 2019-03-11 (×14): 1 mg via INTRAVENOUS
  Filled 2019-03-07 (×15): qty 1

## 2019-03-07 MED ORDER — PANTOPRAZOLE SODIUM 40 MG PO TBEC
40.0000 mg | DELAYED_RELEASE_TABLET | Freq: Two times a day (BID) | ORAL | Status: DC
  Administered 2019-03-07 – 2019-03-11 (×9): 40 mg via ORAL
  Filled 2019-03-07 (×9): qty 1

## 2019-03-07 MED ORDER — INSULIN ASPART 100 UNIT/ML ~~LOC~~ SOLN
0.0000 [IU] | Freq: Three times a day (TID) | SUBCUTANEOUS | Status: DC
  Administered 2019-03-07: 1 [IU] via SUBCUTANEOUS
  Administered 2019-03-08: 2 [IU] via SUBCUTANEOUS
  Administered 2019-03-08 – 2019-03-11 (×7): 1 [IU] via SUBCUTANEOUS
  Administered 2019-03-11: 3 [IU] via SUBCUTANEOUS

## 2019-03-07 MED ORDER — ALBUTEROL SULFATE (2.5 MG/3ML) 0.083% IN NEBU: 3.0000 mL | INHALATION_SOLUTION | RESPIRATORY_TRACT | Status: DC | PRN

## 2019-03-07 MED ORDER — TRAZODONE HCL 50 MG PO TABS
100.0000 mg | ORAL_TABLET | Freq: Every day | ORAL | Status: DC
  Administered 2019-03-07 – 2019-03-10 (×4): 100 mg via ORAL
  Filled 2019-03-07 (×4): qty 2

## 2019-03-07 MED ORDER — ESCITALOPRAM OXALATE 10 MG PO TABS
10.0000 mg | ORAL_TABLET | Freq: Every day | ORAL | Status: DC
  Administered 2019-03-07 – 2019-03-11 (×5): 10 mg via ORAL
  Filled 2019-03-07 (×5): qty 1

## 2019-03-07 MED ORDER — METOPROLOL SUCCINATE ER 50 MG PO TB24
50.0000 mg | ORAL_TABLET | Freq: Two times a day (BID) | ORAL | Status: DC
  Administered 2019-03-07 – 2019-03-11 (×7): 50 mg via ORAL
  Filled 2019-03-07 (×8): qty 1

## 2019-03-07 MED ORDER — INSULIN ASPART 100 UNIT/ML ~~LOC~~ SOLN: 0.0000 [IU] | Freq: Every day | SUBCUTANEOUS | Status: DC

## 2019-03-07 NOTE — Consult Note (Signed)
Chief Complaint: Patient was seen in consultation today for CT-guided aspiration of enlarging pancreatic pseudocyst Chief Complaint  Patient presents with   Covid Positive   Abdominal Pain    Referring Physician(s): Danis,H  Supervising Physician: Jacqulynn Cadet  Patient Status: [facility] - In-pt  History of Present Illness: Steve Anthony is a 69 y.o. male with multiple medical problems including coronary artery disease with prior stenting in 2006, paroxysmal atrial fibrillation on Eliquis, colon resection/diverticulitis, COPD, hyperlipidemia, hypertension, diabetes, chronic kidney disease, GERD, obstructive sleep apnea, cirrhosis with portal hypertension and grade 1 varices, idiopathic pancreatitis complicated by a pseudocyst formation as well as recent Covid infection in December of last year who was admitted on 2/9 with progressive and worsening left-sided abdominal pain, fever, nausea and diminished appetite.  Patient had prior EUS in June 2020 to further evaluate pancreatic pseudocyst/assess candidacy for cyst gastrostomy but due to vasculature involvement of majority of superior aspect of the pseudocyst wall no further intervention was performed.  CT abdomen pelvis performed yesterday revealed increase in size of large pancreatic tail pseudocyst with mildly increased surrounding soft tissue stranding and wall thickening inferiorly.  There were stable changes of cirrhosis of liver with portal venous hypertension, including splenomegaly and large number of upper abdominal varices along with colonic diverticulosis and small right hydrocele.  Patient is currently afebrile, WBC normal, hemoglobin 10.9, creatinine 1.02, PT 19.4, INR 1.6.  Request now received from GI service for aspiration of pseudocyst to minimize patient's abdominal discomfort.  Past Medical History:  Diagnosis Date   Arthritis    CAD (coronary artery disease)    a. S/P PCI mid LAD (vision stent) 08/31/2004;  b. repeat  cath 6/11 - no progression of CAD. c. Cath 10/2012 given moderate CAD on cardiac CT - no change from prior cath.   Chest pain 12/16/2016   COPD (chronic obstructive pulmonary disease) ([place])    COVID-19 virus infection    Diverticulitis    DJD (degenerative joint disease) of lumbar spine    Dyslipidemia    Gallstones    GERD (gastroesophageal reflux disease)    History of kidney stones    Hypertension    Hyperthyroidism    Melanoma ([place])    melanoma removed from neck    On home oxygen therapy    "2L q hs" (11/08/2012)   OSA (obstructive sleep apnea)    a. cpap noncompliance- patient reports on 12/10/14- does not use CPAP   Palpitations    a. 10/2012 - s/p LINQ loop recorder to assess for arrhythmia.    Pancreatitis    Paroxysmal A-fib ([place])    a. S/P PVI 2/11 and 9/11   SVT (supraventricular tachycardia) ([place])    a. Atypical AVNRT of the slow pathway   Type II diabetes mellitus [facility])     Past Surgical History:  Procedure Laterality Date   ATRIAL ABLATION SURGERY  2007   S/P slow pathway ablation for atypical AVNRT    BIOPSY  07/17/2018   Procedure: BIOPSY;  Surgeon: Irving Copas., MD;  Location: [facility] ENDOSCOPY;  Service: Gastroenterology;;   CHOLECYSTECTOMY  ~ 2008   COLON RESECTION  02/2013    COLONOSCOPY  12/25/2007   Colonic polyp status post polypectomy. Internal hemorrhoids.    CORONARY ANGIOPLASTY WITH STENT PLACEMENT  08/30/2004   Vision; to LAD    CYSTOSCOPY W/ STONE MANIPULATION  ~ 1998   "once" (11/08/2012)   EP Study  2008   Negative EP study  in [place]   ESOPHAGOGASTRODUODENOSCOPY (EGD) WITH PROPOFOL N/A 07/17/2018   Procedure: ESOPHAGOGASTRODUODENOSCOPY (EGD) WITH PROPOFOL;  Surgeon: Rush Landmark Telford Nab., MD;  Location: [place];  Service: Gastroenterology;  Laterality: N/A;   FRACTURE SURGERY     LEFT HEART CATHETERIZATION WITH CORONARY ANGIOGRAM N/A 11/10/2012   Procedure: LEFT HEART CATHETERIZATION WITH CORONARY  ANGIOGRAM;  Surgeon: Burnell Blanks, MD;  Location[facility] C[facility] LAB;  Service: Cardiovascular;  Laterality: N/A;   LOOP RECORDER IMPLANT N/A 11/10/2012   Medtronic LinQ implanted by Dr Rayann Heman for afib management   LUMBAR Avis  2000; 2002   POSTERIOR LUMBAR FUSION  2004   PROSTATE SURGERY  ~ 2009   PVI/ CTI ablation  02/25/2009 and 09/2009   S/P afib/ CTI ablation   TIBIA FRACTURE SURGERY Right 1990   "broke in 3 places; had rod put in" (11/08/2012)   TIBIA HARDWARE REMOVAL Right ~ [place] Right 12/18/2014   Procedure: RIGHT TOTAL HIP ARTHROPLASTY ANTERIOR APPROACH;  Surgeon: Gaynelle Arabian, MD;  Location: [facility] OR[facility];  Service: Orthopedics;  Laterality: Right;   UPPER ESOPHAGEAL ENDOSCOPIC ULTRASOUND (EUS) N/A 07/17/2018   Procedure: UPPER ESOPHAGEAL ENDOSCOPIC ULTRASOUND (EUS);  Surgeon: Irving Copas., MD;  Location: [place];  Service: Gastroenterology;  Laterality: N/A;    Allergies: Penicillins  Medications: Prior to Admission medications   Medication Sig Start Date End Date Taking? Authorizing Provider  acetaminophen (TYLENOL) 325 MG tablet Take by mouth every 6 (six) hours as needed for headache.   Yes [provider]  albuterol (VENTOLIN HFA) 108 (90 Base) MCG/ACT inhaler Inhale 2 puffs into the lungs every 4 (four) hours as needed for wheezing or shortness of breath.  01/28/19  Yes [provider]  Cyanocobalamin (VITAMIN B-12 IJ) Inject 1,000 mcg as directed every 30 (thirty) days.   Yes [provider]  docusate sodium (COLACE) 100 MG capsule Take 100 mg by mouth See admin instructions. Three times a week   Yes [provider]  ELIQUIS 5 MG TABS tablet TAKE 1 TABLET BY MOUTH TWICE A DAY Patient taking differently: Take 5 mg by mouth 2 (two) times daily.  12/25/18  Yes Allred, Jeneen Rinks, MD  escitalopram (LEXAPRO) 10 MG tablet Take 10 mg by mouth daily. 05/30/17  Yes [provider]  fenofibrate 160 MG tablet Take 160 mg by mouth daily.     Yes [provider]  ferrous sulfate 325 (65 FE) MG tablet Take 325 mg by mouth See admin instructions. Take 1 tablet 2 times a week   Yes [provider]  furosemide (LASIX) 20 MG tablet Take 40 mg by mouth daily.  03/15/17  Yes [provider]  gabapentin (NEURONTIN) 300 MG capsule Take 300 mg by mouth 2 (two) times daily.    Yes [provider]  Melatonin 10 MG TABS Take 10 mg by mouth at bedtime.    Yes [provider]  metFORMIN (GLUCOPHAGE) 500 MG tablet Take 500 mg by mouth 2 (two) times daily.   Yes [provider]  metoprolol succinate (TOPROL-XL) 50 MG 24 hr tablet TAKE 1 TABLET BY MOUTH TWICE A DAY Patient taking differently: Take 50 mg by mouth 2 (two) times daily.    Yes Thompson Grayer, MD  naloxone [facility]) 4 MG/0.1ML LIQD nasal spray kit Place 1 spray into the nose daily as needed (opioid overdose).   Yes [provider]  nitroGLYCERIN (NITROSTAT) 0.4 MG SL tablet  Place 1 tablet (0.4 mg total) under the tongue every 5 (five) minutes x 3 doses as needed for chest pain. 12/17/16  Yes Barrett, Evelene Croon, PA-C  OVER THE COUNTER MEDICATION Apply 1 application topically daily as needed (rash). OTC fanny cream   Yes [provider]  oxyCODONE-acetaminophen (PERCOCET) 10-325 MG tablet Take 1 tablet by mouth every 4 (four) hours as needed for pain.    Yes [provider]  pantoprazole (PROTONIX) 40 MG tablet TAKE 1 TABLET BY MOUTH TWICE A DAY Patient taking differently: Take 40 mg by mouth 2 (two) times daily.  10/09/18  Yes Mansouraty, Telford Nab., MD  promethazine (PHENERGAN) 25 MG tablet Take 1 tablet (25 mg total) by mouth every 8 (eight) hours as needed for nausea or vomiting. 09/25/18  Yes Jackquline Denmark, MD  spironolactone (ALDACTONE) 25 MG tablet Take 25 mg by mouth daily. 10/26/14  Yes [provider]  Tamsulosin HCl (FLOMAX) 0.4  MG CAPS Take 0.4 mg by mouth daily.     Yes [provider]  tiZANidine (ZANAFLEX) 4 MG tablet Take 4 mg by mouth 2 (two) times daily. In the evening 03/01/18  Yes [provider]  traZODone (DESYREL) 50 MG tablet Take 100 mg by mouth at bedtime.  08/17/17  Yes [provider]  OXYGEN Inhale 2 L into the lungs daily as needed (shortness of breath).     [provider]     Family History  Problem Relation Age of Onset   Emphysema Mother    Heart attack Father        CVA   Hypertension Father    Diabetes Father    Heart disease Father        MI   Emphysema Maternal Grandfather        Smoker   Heart disease Paternal Grandfather    Colon cancer Neg Hx    Esophageal cancer Neg Hx    Inflammatory bowel disease Neg Hx    Liver disease Neg Hx    Pancreatic cancer Neg Hx    Rectal cancer Neg Hx    Stomach cancer Neg Hx     Social History   Socioeconomic History   Marital status: Married    Spouse name: Not on file   Number of children: 1   Years of education: Not on file   Highest education level: Not on file  Occupational History   Occupation: DISABLED    Employer: DISABLED   Occupation: Retired [employer] Administrator  Tobacco Use   Smoking status: Former Smoker    Packs/day: 3.00    Years: 38.00    Pack years: 114.00    Types: Cigarettes    Quit date: 03/25/2004    Years since quitting: 14.9   Smokeless tobacco: Former Systems developer    Types: [place] date: 04/25/2004  Substance and Sexual Activity   Alcohol use: Not Currently    Comment:  "quit drinking in 1989"   Drug use: No   Sexual activity: Not Currently  Other Topics Concern   Not on file  Social History Narrative   Resides in [place] with his wife   Two children and two grandchildren   Attends [employer]   Social Determinants of Health   Financial Resource Strain:    Difficulty of Paying Living Expenses: Not on file  Food Insecurity:      Worried About Charity fundraiser in the Last Year: Not on file  Ran Out of Food in the Last Year: Not on file  Transportation Needs:    Lack of Transportation (Medical): Not on file   Lack of Transportation (Non-Medical): Not on file  Physical Activity:    Days of Exercise per Week: Not on file   Minutes of Exercise per Session: Not on file  Stress:    Feeling of Stress : Not on file  Social Connections:    Frequency of Communication with Friends and Family: Not on file   Frequency of Social Gatherings with Friends and Family: Not on file   Attends Religious Services: Not on file   Active Member of Clubs or Organizations: Not on file   Attends Archivist Meetings: Not on file   Marital Status: Not on file      Review of Systems currently denies fever, headache, chest pain, dyspnea, cough, nausea, vomiting or bleeding.  Does have abdominal, primarily left-sided, pain as well as back pain  Vital Signs: BP 121/84 (BP Location: Right Arm)    Pulse 73    Temp 98 F (36.7 C) (Oral)    Resp 16    Ht 6' (1.829 m)    Wt 228 lb 9.6 oz (103.7 kg)    SpO2 91%    BMI 31.00 kg/m   Physical Exam awake, alert.  Chest clear to auscultation bilaterally.  Heart with regular rate and rhythm.  Abdomen soft, midline incision lower abdomen from prior partial colectomy, ventral hernia along the midline, tender left lateral abdominal/flank region; extremities with full range of motion  Imaging: CT ABDOMEN PELVIS W CONTRAST  Addendum Date: 03/06/2019   ADDENDUM REPORT: 03/06/2019 14:08 ADDENDUM: The wall thickening involving the proximal descending colon is compatible with secondary involvement by the adjacent inflammatory changes. Electronically Signed   By: Claudie Revering M.D.   On: 03/06/2019 14:08   Result Date: 03/06/2019 CLINICAL DATA:  Mid to left abdominal pain since discharge from the hospital in late January 2021. EXAM: CT ABDOMEN AND PELVIS WITH CONTRAST TECHNIQUE:  Multidetector CT imaging of the abdomen and pelvis was performed using the standard protocol following bolus administration of intravenous contrast. CONTRAST:  166m OMNIPAQUE IOHEXOL 300 MG/ML  SOLN COMPARISON:  02/20/2019 FINDINGS: Lower chest: Normal sized heart. Clear lung bases. No lung nodules seen today. Hepatobiliary: Stable diffuse low density of the liver relative to the spleen with enlargement of the caudate lobe and lateral segment of the left lobe of the liver. Cholecystectomy clips. Pancreas: The previously demonstrated 9.5 x 8.1 cm cyst in the tail of the pancreas currently measures 12.0 x 8.7 cm in maximum dimensions. A small area of adjacent oval low density in the tail of the pancreas is unchanged. Mild peripancreatic soft tissue stranding with improvement with the exception of increased soft tissue stranding adjacent to the inferior aspect of the large cyst in the tail of the pancreas with interval mild, ill-defined wall thickening of that portion of the cyst. The cyst is partially encasing the splenic artery and splenic veins. There is also a smaller cystic area lateral to the larger cyst, inferior to the spleen weight is has increased mildly in size with mildly increased surrounding wall thickness with enhancement and adjacent soft tissue stranding. This smaller cyst measures 1.6 cm in maximum diameter on coronal image number 45 series 5. Spleen: No significant change in diffuse enlargement of the spleen Adrenals/Urinary Tract: Normal appearing adrenal glands. Previously demonstrated bilateral renal cysts. Unremarkable urinary bladder and ureters. Stomach/Bowel: Proximal sigmoid colon  anastomosis. Mild soft tissue thickening involving the walls of the proximal descending colon adjacent to the soft tissue stranding associated with the inferior portion of the large pancreatic tail cyst. Multiple diverticula at the junction of the descending colon and sigmoid colon. Normal appearing appendix.  Unremarkable stomach and small bowel. Vascular/Lymphatic: Large number of upper abdominal varices. Atheromatous calcifications without aneurysm. No enlarged lymph nodes. Reproductive: Mildly enlarged prostate gland containing coarse calcifications. Other: Tiny umbilical hernia containing fat. Small right hydrocele. Musculoskeletal: Right hip prosthesis. L5-S1 interbody and pedicle screw and rod fusion. Lumbar and lower thoracic spine degenerative changes. IMPRESSION: 1. Interval mild increase in size of a large pancreatic tail pseudocyst with mildly increased surrounding soft tissue stranding and wall thickening inferiorly. This is compatible with interval inflammatory/infectious changes involving the inferior portion of the cyst and an adjacent 1.6 cm component. 2. Stable changes of cirrhosis of the liver with portal venous hypertension, including splenomegaly and large number of upper abdominal varices. 3. Colonic diverticulosis. 4. Small right hydrocele. Electronically Signed: By: Claudie Revering M.D. On: 03/06/2019 13:34   CT ABDOMEN PELVIS W CONTRAST  Result Date: 02/20/2019 CLINICAL DATA:  Nausea and abdominal pain. EXAM: CT ABDOMEN AND PELVIS WITH CONTRAST TECHNIQUE: Multidetector CT imaging of the abdomen and pelvis was performed using the standard protocol following bolus administration of intravenous contrast. CONTRAST:  15m OMNIPAQUE IOHEXOL 300 MG/ML  SOLN COMPARISON:  Multiple prior CT scans from 2020. The most recent is November 09, 2018. FINDINGS: Lower chest: The lung bases are clear of an acute process. Small left basilar pulmonary nodules are stable. There is also a calcified granuloma at the right lung base. The heart is normal in size. No pericardial effusion. Stable age advanced coronary artery calcifications. Hepatobiliary: Stable cirrhotic changes involving the liver with mild liver contour abnormality, dilated hepatic fissures, increased caudate to right lobe ratio, portal venous  hypertension, portal venous collaterals and extensive gastric varices. Diffuse hepatic steatosis again demonstrated. No focal hepatic lesions. The portal and hepatic veins are patent. The gallbladder is surgically absent. No common bile duct dilatation. Pancreas: CT findings of acute pancreatitis with pancreatic and peripancreatic inflammatory changes. I do not see any findings for pancreatic necrosis. Enlarging well-formed pseudocyst in the pancreatic tail extending to the splenic hilum. This measures 9.5 x 8.0 cm and previously measured 7.5 x 6.5 cm. Spleen: Stable splenomegaly. No splenic lesions or splenic infarction. Adrenals/Urinary Tract: The adrenal glands are unremarkable and stable. Stable bilateral renal cysts. No worrisome renal lesions. No collecting system abnormalities are identified. The bladder is unremarkable. Stomach/Bowel: The stomach, duodenum, small bowel and colon are grossly normal without oral contrast. Stable duodenal diverticuli. No acute inflammatory changes, mass lesions or obstructive findings. The terminal ileum and appendix are normal. Vascular/Lymphatic: Stable vascular calcifications. No aneurysm or dissection. The major venous structures are patent. The the splenic vein is occluded near the pancreatic pseudocyst but is reconstituted near the portal splenic confluence. The portal vein is patent. No mesenteric or retroperitoneal lymphadenopathy. No pelvic adenopathy. Reproductive: The prostate gland and seminal vesicles are unremarkable. Other: No pelvic mass or free pelvic fluid collections. No inguinal mass or adenopathy or hernia. Musculoskeletal: Moderate artifact from a right hip prosthesis. Stable L5-S1 fusion hardware. No significant or acute bony findings. IMPRESSION: 1. CT findings consistent with acute pancreatitis. No findings for pancreatic necrosis. 2. Enlarging pancreatic pseudocyst. 3. Stable cirrhotic changes involving the liver with portal venous hypertension, portal  venous collaterals and splenomegaly. No focal hepatic lesions. 4. Stable  small left basilar pulmonary nodules. Electronically Signed   By: Marijo Sanes M.D.   On: 02/20/2019 12:50    Labs:  CBC: Recent Labs    02/20/19 1044 02/21/19 0429 03/06/19 1149 03/07/19 0425  WBC 4.6 3.8* 5.1 3.3*  HGB 14.4 12.5* 13.0 10.9*  HCT 44.3 39.8 41.2 35.0*  PLT 68* 67* 127* 93*    COAGS: Recent Labs    07/14/18 0934 03/07/19 1118  INR 1.8* 1.6*    BMP: Recent Labs    02/21/19 0429 02/22/19 0514 03/06/19 1149 03/07/19 0425  NA 133* 135 135 139  K 4.1 3.9 3.7 3.9  CL 100 103 100 105  CO2 _0 GLUCOSE 162* 142* 132* 125*  BUN _1 CALCIUM 8.5* 8.3* 9.0 8.3*  CREATININE 1.53* 1.38* 1.26* 1.02  GFRNONAA 46* 53* 59* >60  GFRAA 54* >60 >60 >60    LIVER FUNCTION TESTS: Recent Labs    02/21/19 0429 02/22/19 0514 03/06/19 1149 03/07/19 0425  BILITOT 1.1 1.0 0.5 1.0  AST 9* 10* 15 10*  ALT _2 ALKPHOS 41 36* 84 66  PROT 6.3* 5.7* 6.6 5.9*  ALBUMIN 3.6 3.2* 3.0* 2.7*    TUMOR MARKERS: Recent Labs    03/17/18 1250  CEA <0.5  CA199 14    Assessment and Plan: 69 y.o. male with multiple medical problems including coronary artery disease with prior stenting in 2006, paroxysmal atrial fibrillation on Eliquis, colon resection/diverticulitis, COPD, hyperlipidemia, hypertension, diabetes, chronic kidney disease, GERD, obstructive sleep apnea, cirrhosis with portal hypertension and grade 1 varices, idiopathic pancreatitis complicated by a pseudocyst formation as well as recent Covid infection in December of last year who was admitted on 2/9 with progressive and worsening left-sided abdominal pain, fever, nausea and diminished appetite.  Patient had prior EUS in June 2020 to further evaluate pancreatic pseudocyst/assess candidacy for cyst gastrostomy but due to vasculature involvement of majority of superior aspect of the pseudocyst wall no further intervention  was performed.  CT abdomen pelvis performed yesterday revealed increase in size of large pancreatic tail pseudocyst with mildly increased surrounding soft tissue stranding and wall thickening inferiorly.  There were stable changes of cirrhosis of liver with portal venous hypertension, including splenomegaly and large number of upper abdominal varices along with colonic diverticulosis and small right hydrocele.  Patient is currently afebrile, WBC normal, hemoglobin 10.9, creatinine 1.02, PT 19.4, INR 1.6.  Request now received from GI service for aspiration of pseudocyst to minimize patient's abdominal discomfort.  Imaging studies have been reviewed by Dr.Henn.  Patient has been discussed with [facility] and GI.  Details/risks of procedure, including but not limited to, internal bleeding, infection, injury to adjacent structures discussed with patient with his understanding and consent.  Procedure scheduled for 2/11.   Thank you for this interesting consult.  I greatly enjoyed meeting Steve Anthony and look forward to participating in their care.  A copy of this report was sent to the requesting provider on this date.  Electronically Signed: D. Rowe Robert, PA-C 03/07/2019, 3:58 PM   I spent a total of 30 minutes    in face to face in clinical consultation, greater than 50% of which was counseling/coordinating care for CT-guided aspiration of pancreatic pseudocyst

## 2019-03-07 NOTE — Progress Notes (Addendum)
Consulted by GI regarding pancreatic pseudocyst drainage. Likely no surgical intervention indicated for this but will ask Dr. [name] to review the patient's chart to see if she has any input or would be willing to consider surgical intervention.    Jackson Latino, [facility]

## 2019-03-07 NOTE — Progress Notes (Signed)
Spoke with [facility] regarding patient's recent covid status. He is no longer in window for quarantine (over 14 days positive, no symptoms). Patient also does not need to be tested again upon admit (too soon).Steve Anthony

## 2019-03-07 NOTE — Consult Note (Addendum)
Referring Provider:  [facility]         Primary Care Physician:  [name], MD Primary Gastroenterologist:  Carmell Austria, MD            Steve Anthony were asked to see this patient for:    Abdominal pain / pseudocyst              ASSESSMENT /  PLAN    1. 69 yo male with hx of acute idiopathic pancreatitis complicated by large pseudocyst not amenable to endoscopic cyst-gastrostomy due to vasculature involvement found on EUS June 2020. Admitted with abdominal pain and CT scan demonstrating enlargement of the pseudocyst Also mild soft tissue thickening of walls of proximal descending colon adjacent to the soft tissue stranding associated with inferior portion of pancreatic tail cysts.  Reactive ? There are multiple diverticular at junction of descending and sigmoid colon.  -Will consult [facility] to explore surgical options for pseudocyst drainage.  -Will also ask IR to evaluate in case surgical options aren't feasible.   2. Cirrhosis complicated by portal hypertension. Compensated. Grade I varices, No etoh use since 1989 -Home diuretics on hold.   3. AKI on CKD. Cr improved overnight from 1.26 >>> 1.02.   4. Atrial fibrillation, Eliquis on hold.   5. COVID 19 + 01/24/2019   HPI:    Chief Complaint: abdominal pain   Steve Anthony is a 69 y.o. male with a multiple medical problems not limited to CAD / stent in 2006, PAF on Eliquis, colon resection, COPD, hyperlipidemia, HTN, DM, CKD 3, cirrhosis, idiopathic pancreatitis complicated by large pseudocyst, COVID19 in December.   Patient known to Dr. Lyndel Safe, evaluated by Dr. Rush Landmark for pseudocyst management in March 2020.  He underwent EUS in June 2020. There were blood vessels surrounding almost entire superior aspect of the pseudocyst wall making FNA difficult. At the time patient was doing well from a clinical standpoint so decision was made to monitor cyst for time being.   Patient seen in the office yesterday with complaints of severe  left sided abdominal pain with fever of 101. He has been nauseated, poor appetite over last couple of days. Patient sent to [facility] ED. CT scan demonstrated mild increase in size of a large pancreatic tail pseudocyst with mild increased surrounding soft tissue stranding and wall thickening.Also some colon wall thickening / surrounding stranding associated with inferior portion of pancreatic tail cyst. Patient says he did okay for a several months after EUS. Sometime in December he began getting recurrent episodes of left sided abdominal pain which would subside with diet reduction. However, he had an episode in late January that progressed to the point he needed to be seen in the office. The pain is left mid / LLQ. It is nearly constant and it is exacerbated by eating.  Pain associated with nausea. He takes Oxycodone for back pain, it helps the abdominal pain " a little bit"  Past Medical History:  Diagnosis Date  . Arthritis   . CAD (coronary artery disease)    a. S/P PCI mid LAD (vision stent) 08/31/2004;  b. repeat cath 6/11 - no progression of CAD. c. Cath 10/2012 given moderate CAD on cardiac CT - no change from prior cath.  . Chest pain 12/16/2016  . COPD (chronic obstructive pulmonary disease) ([place])   . COVID-19 virus infection   . Diverticulitis   . DJD (degenerative joint disease) of lumbar spine   . Dyslipidemia   .  Gallstones   . GERD (gastroesophageal reflux disease)   . History of kidney stones   . Hypertension   . Hyperthyroidism   . Melanoma ([place])    melanoma removed from neck   . On home oxygen therapy    "2L q hs" (11/08/2012)  . OSA (obstructive sleep apnea)    a. cpap noncompliance- patient reports on 12/10/14- does not use CPAP  . Palpitations    a. 10/2012 - s/p LINQ loop recorder to assess for arrhythmia.   . Pancreatitis   . Paroxysmal A-fib ([place])    a. S/P PVI 2/11 and 9/11  . SVT (supraventricular tachycardia) ([place])    a. Atypical AVNRT of the slow  pathway  . Type II diabetes mellitus ([place])     Past Surgical History:  Procedure Laterality Date  . ATRIAL ABLATION SURGERY  2007   S/P slow pathway ablation for atypical AVNRT   . BIOPSY  07/17/2018   Procedure: BIOPSY;  Surgeon: Rush Landmark Telford Nab., MD;  Location: [place];  Service: Gastroenterology;;  . Lorin [facility]  ~ 2008  . COLON RESECTION  02/2013   . COLONOSCOPY  12/25/2007   Colonic polyp status post polypectomy. Internal hemorrhoids.   . CORONARY ANGIOPLASTY WITH STENT PLACEMENT  08/30/2004   Vision; to LAD   . CYSTOSCOPY W/ STONE MANIPULATION  ~ 1998   "once" (11/08/2012)  . EP Study  2008   Negative EP study in [place]  . ESOPHAGOGASTRODUODENOSCOPY (EGD) WITH PROPOFOL N/A 07/17/2018   Procedure: ESOPHAGOGASTRODUODENOSCOPY (EGD) WITH PROPOFOL;  Surgeon: Rush Landmark Telford Nab., MD;  Location: [place];  Service: Gastroenterology;  Laterality: N/A;  . FRACTURE SURGERY    . LEFT HEART CATHETERIZATION WITH CORONARY ANGIOGRAM N/A 11/10/2012   Procedure: LEFT HEART CATHETERIZATION WITH CORONARY ANGIOGRAM;  Surgeon: Burnell Blanks, MD;  Location[facility] C[facility] LAB;  Service: Cardiovascular;  Laterality: N/A;  . LOOP RECORDER IMPLANT N/A 11/10/2012   Medtronic LinQ implanted by Dr Rayann Heman for afib management  . [place] SURGERY  2000; 2002  . POSTERIOR LUMBAR FUSION  2004  . PROSTATE SURGERY  ~ 2009  . PVI/ CTI ablation  02/25/2009 and 09/2009   S/P afib/ CTI ablation  . TIBIA FRACTURE SURGERY Right 1990   "broke in 3 places; had rod put in" (11/08/2012)  . TIBIA HARDWARE REMOVAL Right ~ 1991  . TONSILLECTOMY  1965  . TOTAL HIP ARTHROPLASTY Right 12/18/2014   Procedure: RIGHT TOTAL HIP ARTHROPLASTY ANTERIOR APPROACH;  Surgeon: Gaynelle Arabian, MD;  Location: [facility];  Service: Orthopedics;  Laterality: Right;  . UPPER ESOPHAGEAL ENDOSCOPIC ULTRASOUND (EUS) N/A 07/17/2018   Procedure: UPPER ESOPHAGEAL ENDOSCOPIC ULTRASOUND (EUS);  Surgeon: Irving Copas.,  MD;  Location: [place];  Service: Gastroenterology;  Laterality: N/A;    Prior to Admission medications   Medication Sig Start Date End Date Taking? Authorizing Provider  acetaminophen (TYLENOL) 325 MG tablet Take by mouth every 6 (six) hours as needed for headache.   Yes [provider]  albuterol (VENTOLIN HFA) 108 (90 Base) MCG/ACT inhaler Inhale 2 puffs into the lungs every 4 (four) hours as needed for wheezing or shortness of breath.  01/28/19  Yes [provider]  Cyanocobalamin (VITAMIN B-12 IJ) Inject 1,000 mcg as directed every 30 (thirty) days.   Yes [provider]  docusate sodium (COLACE) 100 MG capsule Take 100 mg by mouth See admin instructions. Three times a week   Yes [provider]  ELIQUIS 5 MG TABS tablet TAKE 1  TABLET BY MOUTH TWICE A DAY Patient taking differently: Take 5 mg by mouth 2 (two) times daily.  12/25/18  Yes Allred, Jeneen Rinks, MD  escitalopram (LEXAPRO) 10 MG tablet Take 10 mg by mouth daily. 05/30/17  Yes [provider]  fenofibrate 160 MG tablet Take 160 mg by mouth daily.     Yes [provider]  ferrous sulfate 325 (65 FE) MG tablet Take 325 mg by mouth See admin instructions. Take 1 tablet 2 times a week   Yes [provider]  furosemide (LASIX) 20 MG tablet Take 40 mg by mouth daily.  03/15/17  Yes [provider]  gabapentin (NEURONTIN) 300 MG capsule Take 300 mg by mouth 2 (two) times daily.    Yes [provider]  Melatonin 10 MG TABS Take 10 mg by mouth at bedtime.    Yes [provider]  metFORMIN (GLUCOPHAGE) 500 MG tablet Take 500 mg by mouth 2 (two) times daily.   Yes [provider]  metoprolol succinate (TOPROL-XL) 50 MG 24 hr tablet TAKE 1 TABLET BY MOUTH TWICE A DAY Patient taking differently: Take 50 mg by mouth 2 (two) times daily.    Yes Thompson Grayer, MD  naloxone [facility]) 4 MG/0.1ML LIQD nasal spray kit Place 1 spray into the nose daily as needed  (opioid overdose).   Yes [provider]  nitroGLYCERIN (NITROSTAT) 0.4 MG SL tablet Place 1 tablet (0.4 mg total) under the tongue every 5 (five) minutes x 3 doses as needed for chest pain. 12/17/16  Yes Barrett, Evelene Croon, PA-C  OVER THE COUNTER MEDICATION Apply 1 application topically daily as needed (rash). OTC fanny cream   Yes [provider]  oxyCODONE-acetaminophen (PERCOCET) 10-325 MG tablet Take 1 tablet by mouth every 4 (four) hours as needed for pain.    Yes [provider]  pantoprazole (PROTONIX) 40 MG tablet TAKE 1 TABLET BY MOUTH TWICE A DAY Patient taking differently: Take 40 mg by mouth 2 (two) times daily.  10/09/18  Yes Mansouraty, Telford Nab., MD  promethazine (PHENERGAN) 25 MG tablet Take 1 tablet (25 mg total) by mouth every 8 (eight) hours as needed for nausea or vomiting. 09/25/18  Yes Jackquline Denmark, MD  spironolactone (ALDACTONE) 25 MG tablet Take 25 mg by mouth daily. 10/26/14  Yes [provider]  Tamsulosin HCl (FLOMAX) 0.4 MG CAPS Take 0.4 mg by mouth daily.     Yes [provider]  tiZANidine (ZANAFLEX) 4 MG tablet Take 4 mg by mouth 2 (two) times daily. In the evening 03/01/18  Yes [provider]  traZODone (DESYREL) 50 MG tablet Take 100 mg by mouth at bedtime.  08/17/17  Yes [provider]  OXYGEN Inhale 2 L into the lungs daily as needed (shortness of breath).     [provider]    Current Facility-Administered Medications  Medication Dose Route Frequency Provider Last Rate Last Admin  . 0.9 %  sodium chloride infusion   Intravenous Continuous Phillips Grout, MD 125 mL/hr at 03/07/19 0641 New Bag at 03/07/19 0641  . acetaminophen (TYLENOL) tablet 650 mg  650 mg Oral Q6H PRN Phillips Grout, MD       Or  . acetaminophen (TYLENOL) suppository 650 mg  650 mg Rectal Q6H PRN Phillips Grout, MD      . HYDROmorphone (DILAUDID) injection 0.5 mg  0.5 mg Intravenous Q2H PRN Lucrezia Starch, MD   0.5  mg at 03/06/19 1927  .  HYDROmorphone (DILAUDID) injection 1 mg  1 mg Intravenous Q4H PRN Phillips Grout, MD   1 mg at 03/07/19 1324  . ondansetron (ZOFRAN) tablet 4 mg  4 mg Oral Q6H PRN Phillips Grout, MD       Or  . ondansetron [facility]) injection 4 mg  4 mg Intravenous Q6H PRN Derrill Kay A, MD      . oxyCODONE (Oxy IR/ROXICODONE) immediate release tablet 10 mg  10 mg Oral Q4H PRN Phillips Grout, MD   10 mg at 03/07/19 4010    Allergies as of 03/06/2019 - Review Complete 03/06/2019  Allergen Reaction Noted  . Penicillins Hives and Shortness Of Breath     Family History  Problem Relation Age of Onset  . Emphysema Mother   . Heart attack Father        CVA  . Hypertension Father   . Diabetes Father   . Heart disease Father        MI  . Emphysema Maternal Grandfather        Smoker  . Heart disease Paternal Grandfather   . Colon cancer Neg Hx   . Esophageal cancer Neg Hx   . Inflammatory bowel disease Neg Hx   . Liver disease Neg Hx   . Pancreatic cancer Neg Hx   . Rectal cancer Neg Hx   . Stomach cancer Neg Hx     Social History   Socioeconomic History  . Marital status: Married    Spouse name: Not on file  . Number of children: 1  . Years of education: Not on file  . Highest education level: Not on file  Occupational History  . Occupation: DISABLED    Employer: DISABLED  . 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 Smoking status: Former Smoker    Packs/day: 3.00    Years: 38.00    Pack years: 114.00    Types: Cigarettes    Quit date: 03/25/2004    Years since quitting: 14.9  . Smokeless tobacco: Former Systems developer    Types: [place] date: 04/25/2004  Substance and Sexual Activity  . Alcohol use: Not Currently    Comment:  "quit drinking in 1989"  . Drug use: No  . Sexual activity: Not Currently  Other Topics Concern  . Not on file  Social History Narrative   Resides in [place] with his wife   Two children and two grandchildren   Attends [employer]   Social Determinants of Health   Financial Resource Strain:   . Difficulty of Paying Living Expenses: Not on file  Food Insecurity:   . Worried About Charity fundraiser in the Last Year: Not on file  . Ran Out of Food in the Last Year: Not on file  Transportation Needs:   . Lack of Transportation (Medical): Not on file  . Lack of Transportation (Non-Medical): Not on file  Physical Activity:   . Days of Exercise per Week: Not on file  . Minutes of Exercise per Session: Not on file  Stress:   . Feeling of Stress : Not on file  Social Connections:   . Frequency of Communication with Friends and Family: Not on file  . Frequency of Social Gatherings with Friends and Family: Not on file  . Attends Religious Services: Not on file  . Active Member of Clubs or Organizations: Not on file  . Attends Archivist Meetings: Not on file  .  Marital Status: Not on file  Intimate Partner Violence:   . Fear of Current or Ex-Partner: Not on file  . Emotionally Abused: Not on file  . Physically Abused: Not on file  . Sexually Abused: Not on file    Review of Systems: All systems reviewed and negative except where noted in HPI.  Physical Exam: Vital signs in last 24 hours: Temp:  [97.1 F (36.2 C)-99.5 F (37.5 C)] 98.4 F (36.9 C) (02/10 0639) Pulse Rate:  [70-92] 84 (02/10 0639) Resp:  [14-20] 18 (02/10 0639) BP: (102-125)/(74-85) 123/74 (02/10 0639) SpO2:  [91 %-96 %] 91 % (02/10 0639) Weight:  [103.7 kg-103.9 kg] 103.7 kg (02/10 0500) Last BM Date: 03/06/19 General:   Alert, well-developed, male in NAD Psych:  Pleasant, cooperative. Normal mood and affect. Eyes:  Pupils equal, sclera clear, no icterus.   Conjunctiva pink. Ears:  Normal auditory acuity. Nose:  No deformity, discharge,  or lesions. Neck:  Supple; no masses Lungs:  Clear throughout to auscultation.   No wheezes, crackles, or rhonchi.  Heart:  Regular rate and rhythm; no murmurs, no  lower extremity edema Abdomen:  Soft, non-distended, mild left mid abdominal tenderness. BS active, no palp mass, Midline hernia.    Rectal:  Deferred  Msk:  Symmetrical without gross deformities. . Neurologic:  Alert and  oriented x4;  grossly normal neurologically. Skin:  Intact without significant lesions or rashes.   Intake/Output from previous day: 02/09 0701 - 02/10 0700 In: 679.2 [I.V.:679.2] Out: 500 [Urine:500] Intake/Output this shift: No intake/output data recorded.  Lab Results: Recent Labs    03/06/19 1149 03/07/19 0425  WBC 5.1 3.3*  HGB 13.0 10.9*  HCT 41.2 35.0*  PLT 127* 93*   BMET Recent Labs    03/06/19 1149 03/07/19 0425  NA 135 139  K 3.7 3.9  CL 100 105  CO2 25 26  GLUCOSE 132* 125*  BUN 19 13  CREATININE 1.26* 1.02  CALCIUM 9.0 8.3*   LFT Recent Labs    03/07/19 0425  PROT 5.9*  ALBUMIN 2.7*  AST 10*  ALT 13  ALKPHOS 66  BILITOT 1.0   PT/INR No results for input(s): LABPROT, INR in the last 72 hours. Hepatitis Panel No results for input(s): HEPBSAG, HCVAB, HEPAIGM, HEPBIGM in the last 72 hours.   . CBC Latest Ref Rng & Units 03/07/2019 03/06/2019 02/21/2019  WBC 4.0 - 10.5 K/uL 3.3(L) 5.1 3.8(L)  Hemoglobin 13.0 - 17.0 g/dL 10.9(L) 13.0 12.5(L)  Hematocrit 39.0 - 52.0 % 35.0(L) 41.2 39.8  Platelets 150 - 400 K/uL 93(L) 127(L) 67(L)    . CMP Latest Ref Rng & Units 03/07/2019 03/06/2019 02/22/2019  Glucose 70 - 99 mg/dL 125(H) 132(H) 142(H)  BUN 8 - 23 mg/dL 13 19 16   Creatinine 0.61 - 1.24 mg/dL 1.02 1.26(H) 1.38(H)  Sodium 135 - 145 mmol/L 139 135 135  Potassium 3.5 - 5.1 mmol/L 3.9 3.7 3.9  Chloride 98 - 111 mmol/L 105 100 103  CO2 22 - 32 mmol/L 26 25 26   Calcium 8.9 - 10.3 mg/dL 8.3(L) 9.0 8.3(L)  Total Protein 6.5 - 8.1 g/dL 5.9(L) 6.6 5.7(L)  Total Bilirubin 0.3 - 1.2 mg/dL 1.0 0.5 1.0  Alkaline Phos 38 - 126 U/L 66 84 36(L)  AST 15 - 41 U/L 10(L) 15 10(L)  ALT 0 - 44 U/L 13 14 8    Studies/Results: CT ABDOMEN PELVIS  W CONTRAST  Addendum Date: 03/06/2019   ADDENDUM REPORT: 03/06/2019 14:08 ADDENDUM: The wall thickening  involving the proximal descending colon is compatible with secondary involvement by the adjacent inflammatory changes. Electronically Signed   By: Claudie Revering M.D.   On: 03/06/2019 14:08   Result Date: 03/06/2019 CLINICAL DATA:  Mid to left abdominal pain since discharge from the hospital in late January 2021. EXAM: CT ABDOMEN AND PELVIS WITH CONTRAST TECHNIQUE: Multidetector CT imaging of the abdomen and pelvis was performed using the standard protocol following bolus administration of intravenous contrast. CONTRAST:  166m OMNIPAQUE IOHEXOL 300 MG/ML  SOLN COMPARISON:  02/20/2019 FINDINGS: Lower chest: Normal sized heart. Clear lung bases. No lung nodules seen today. Hepatobiliary: Stable diffuse low density of the liver relative to the spleen with enlargement of the caudate lobe and lateral segment of the left lobe of the liver. Cholecystectomy clips. Pancreas: The previously demonstrated 9.5 x 8.1 cm cyst in the tail of the pancreas currently measures 12.0 x 8.7 cm in maximum dimensions. A small area of adjacent oval low density in the tail of the pancreas is unchanged. Mild peripancreatic soft tissue stranding with improvement with the exception of increased soft tissue stranding adjacent to the inferior aspect of the large cyst in the tail of the pancreas with interval mild, ill-defined wall thickening of that portion of the cyst. The cyst is partially encasing the splenic artery and splenic veins. There is also a smaller cystic area lateral to the larger cyst, inferior to the spleen weight is has increased mildly in size with mildly increased surrounding wall thickness with enhancement and adjacent soft tissue stranding. This smaller cyst measures 1.6 cm in maximum diameter on coronal image number 45 series 5. Spleen: No significant change in diffuse enlargement of the spleen Adrenals/Urinary Tract:  Normal appearing adrenal glands. Previously demonstrated bilateral renal cysts. Unremarkable urinary bladder and ureters. Stomach/Bowel: Proximal sigmoid colon anastomosis. Mild soft tissue thickening involving the walls of the proximal descending colon adjacent to the soft tissue stranding associated with the inferior portion of the large pancreatic tail cyst. Multiple diverticula at the junction of the descending colon and sigmoid colon. Normal appearing appendix. Unremarkable stomach and small bowel. Vascular/Lymphatic: Large number of upper abdominal varices. Atheromatous calcifications without aneurysm. No enlarged lymph nodes. Reproductive: Mildly enlarged prostate gland containing coarse calcifications. Other: Tiny umbilical hernia containing fat. Small right hydrocele. Musculoskeletal: Right hip prosthesis. L5-S1 interbody and pedicle screw and rod fusion. Lumbar and lower thoracic spine degenerative changes. IMPRESSION: 1. Interval mild increase in size of a large pancreatic tail pseudocyst with mildly increased surrounding soft tissue stranding and wall thickening inferiorly. This is compatible with interval inflammatory/infectious changes involving the inferior portion of the cyst and an adjacent 1.6 cm component. 2. Stable changes of cirrhosis of the liver with portal venous hypertension, including splenomegaly and large number of upper abdominal varices. 3. Colonic diverticulosis. 4. Small right hydrocele. Electronically Signed: By: SClaudie ReveringM.D. On: 03/06/2019 13:34    Principal Problem:   Pseudocyst of pancreas Active Problems:   COPD without exacerbation ([place]   [place]ites [place]   Recurrent pancreatitis   CKD (chronic kidney disease), stage III   History of COVID-19 01/24/19    PTye Savoy NP-C @  03/07/2019, 8:54 AM  I have reviewed the entire case in detail with the above APP and discussed the plan in detail.  Therefore, I agree with the diagnoses  recorded above. In addition,  I have personally interviewed and examined the patient and have personally reviewed any abdominal/pelvic CT scan images.  I  also discussed the case at length with Dr. Rush Landmark, who had previously evaluated the patient.  My additional thoughts are as follows:  On my exam, he has a visible distention of the left hemiabdomen when supine, and has a palpable fullness there coinciding with this mass.  It is quite tender as well.  Good bowel sounds.  This enlarging cystic lesion is causing him significant pain, so it is clear something must be done about it.  Unfortunately, it is not amenable to endoscopic drainage or cyst gastrostomy due to its location and surrounding structures. I fully appreciate a surgical approach to this would be difficult and high risk, carrying significant potential short and long-term complication risks.  Nevertheless, I would greatly appreciate the opinion of the hepatobiliary surgeon in that regard. That is also why Steve Anthony have consulted interventional radiology to see if there is a potential percutaneous approach to at least sample and drain the fluid.  That would naturally carry its own risks, and may not be as definitive a surgical therapy.  If that is done, fluid should be sent for cell count, culture, amylase and cytology.  In the meantime, pain control and I have started him on a full liquid diet.  He took his last Eliquis dose yesterday, so he could have an intervention such as percutaneous approach as early as tomorrow.  Total time 70 minutes, extensive chart review, patient evaluation, discussion with multiple consultants and care team members.  Nelida Meuse III Office:[phone]

## 2019-03-07 NOTE — Consult Note (Addendum)
[facility] Surgery Consult Note  Steve Anthony 06-Jun-1950  [id number].    Requesting MD: Tye Savoy Chief Complaint: Abdominal pain with pancreatic pseudocyst, nausea and weight loss Reason for Consult: Pancreatic pseudocyst  HPI: Patient is a 69 year old male with multiple medical issues including coronary artery disease, with CAD stent x 1- 2006, proximal atrial fibrillation on Eliquis, history of past alcohol use with cryptogenic cirrhosis, ascites, multiple columns of grade 1 less than 5 mm varices in the distal esophagus.  Mild portal hypertension tensive gastropathy was found in the cardia of the gastric fundus and in the gastric body.  Idiopathic pancreatitis degenerative joint disease, chronic lower back pain, COPD/sleep apnea, hyperlipidemia, GERD, hypertension, hyperlipidemia, type 2 diabetes, stage III kidney disease.  He has a history of recurrent acute pancreatitis with symptomatic pseudocyst which has required multiple admissions in 2019 and 2020.  He was considered for a cyst gastrostomy by Dr. Justice Britain, and was ultimately decided he was not a candidate for this because of the anatomy and blood vessels that surrounded the entirety of the superior aspect of the cyst wall.  He was readmitted to the hospital yesterday with 10/10 pain and decreased oral intake, and weight loss.  CT obtained yesterday shows the previously demonstrated 9.5 x 8.1 cm cyst in the tail of the pancreas is increased to 12.0 x 8.7 cm in maximum dimension.  There is also increased surrounding soft tissue stranding and wall thickening inferiorly this is compatible with interval inflammatory infectious changes involving the inferior portion of the cyst.  Labs show a glucose of 125, calcium 8.3, albumin of 2.7, AST of 10 ALT of 13 total protein 5.9 total bilirubin 1.0.  WBC 3.3, hemoglobin 10.9, hematocrit 35, platelets 93,000.  Patient was Covid positive on 02/20/2019.  His wife was also was positive  for Covid.  Lipase 31, INR is pending.    Since DR. Mansouraty cannot drain this endoscopically he is asking surgery and IR to evaluate for a method to drain this enlarging pancreatic pseudocyst.   ROS: Review of Systems  Constitutional: Positive for weight loss (He thinks he has lost 28 pounds in the last 2 to 3 weeks.).  HENT: Negative.   Eyes: Negative.   Respiratory: Negative.        He reports being positive for Covid January 24, 2019-February 14, 2019.  His last Covid study on 02/20/2019 was positive.  Cardiovascular: Negative.        He notes he has had 11 atrial ablation procedures.  Gastrointestinal: Positive for abdominal pain (Pains primarily in the left upper quadrant more than the lateral aspect.  You can actually feel the cyst.), diarrhea, heartburn and nausea. Negative for blood in stool, constipation, melena and vomiting.  Genitourinary:       Reports decreased urine output.  Musculoskeletal: Positive for back pain (Chronic back pain with 3 prior surgeries.).  Skin: Negative.   Neurological: Negative.   Endo/Heme/Allergies: Bruises/bleeds easily.  Psychiatric/Behavioral: Negative.      Family History  Problem Relation Age of Onset  . Emphysema Mother   . Heart attack Father        CVA  . Hypertension Father   . Diabetes Father   . Heart disease Father        MI  . Emphysema Maternal Grandfather        Smoker  . Heart disease Paternal Grandfather   . Colon cancer Neg Hx   . Esophageal cancer Neg Hx   . Inflammatory  bowel disease Neg Hx   . Liver disease Neg Hx   . Pancreatic cancer Neg Hx   . Rectal cancer Neg Hx   . Stomach cancer Neg Hx     Past Medical History:  Diagnosis Date  . Arthritis   . CAD (coronary artery disease)    a. S/P PCI mid LAD (vision stent) 08/31/2004;  b. repeat cath 6/11 - no progression of CAD. c. Cath 10/2012 given moderate CAD on cardiac CT - no change from prior cath.  . Chest pain 12/16/2016  . COPD (chronic obstructive  pulmonary disease) ([place])   . COVID-19 virus infection   . Diverticulitis   . DJD (degenerative joint disease) of lumbar spine   . Dyslipidemia   . Gallstones   . GERD (gastroesophageal reflux disease)   . History of kidney stones   . Hypertension   . Hyperthyroidism   . Melanoma ([place])    melanoma removed from neck   . On home oxygen therapy    "2L q hs" (11/08/2012)  . OSA (obstructive sleep apnea)    a. cpap noncompliance- patient reports on 12/10/14- does not use CPAP  . Palpitations    a. 10/2012 - s/p LINQ loop recorder to assess for arrhythmia.   . Pancreatitis   . Paroxysmal A-fib ([place])    a. S/P PVI 2/11 and 9/11  . SVT (supraventricular tachycardia) ([place])    a. Atypical AVNRT of the slow pathway  . Type II diabetes mellitus ([place])     Past Surgical History:  Procedure Laterality Date  . ATRIAL ABLATION SURGERY  2007   S/P slow pathway ablation for atypical AVNRT   . BIOPSY  07/17/2018   Procedure: BIOPSY;  Surgeon: Rush Landmark Telford Nab., MD;  Location: [place];  Service: Gastroenterology;;  . [facility]  ~ 2008  . COLON RESECTION  02/2013   . COLONOSCOPY  12/25/2007   Colonic polyp status post polypectomy. Internal hemorrhoids.   . CORONARY ANGIOPLASTY WITH STENT PLACEMENT  08/30/2004   Vision; to LAD   . CYSTOSCOPY W/ STONE MANIPULATION  ~ 1998   "once" (11/08/2012)  . EP Study  2008   Negative EP study in [place]  . ESOPHAGOGASTRODUODENOSCOPY (EGD) WITH PROPOFOL N/A 07/17/2018   Procedure: ESOPHAGOGASTRODUODENOSCOPY (EGD) WITH PROPOFOL;  Surgeon: Rush Landmark Telford Nab., MD;  Location: [place];  Service: Gastroenterology;  Laterality: N/A;  . FRACTURE SURGERY    . LEFT HEART CATHETERIZATION WITH CORONARY ANGIOGRAM N/A 11/10/2012   Procedure: LEFT HEART CATHETERIZATION WITH CORONARY ANGIOGRAM;  Surgeon: Burnell Blanks, MD;  Location: [facility] CATH LAB;  Service: Cardiovascular;  Laterality: N/A;  . LOOP RECORDER IMPLANT N/A 11/10/2012    Medtronic LinQ implanted by Dr Rayann Heman for afib management  . [place] SURGERY  2000; 2002  . POSTERIOR LUMBAR FUSION  2004  . PROSTATE SURGERY  ~ 2009  . PVI/ CTI ablation  02/25/2009 and 09/2009   S/P afib/ CTI ablation  . TIBIA FRACTURE SURGERY Right 1990   "broke in 3 places; had rod put in" (11/08/2012)  . TIBIA HARDWARE REMOVAL Right ~ 1991  . TONSILLECTOMY  1965  . TOTAL HIP ARTHROPLASTY Right 12/18/2014   Procedure: RIGHT TOTAL HIP ARTHROPLASTY ANTERIOR APPROACH;  Surgeon: Gaynelle Arabian, MD;  Location: [facility];  Service: Orthopedics;  Laterality: Right;  . UPPER ESOPHAGEAL ENDOSCOPIC ULTRASOUND (EUS) N/A 07/17/2018   Procedure: UPPER ESOPHAGEAL ENDOSCOPIC ULTRASOUND (EUS);  Surgeon: Irving Copas., MD;  Location: [place];  Service: Gastroenterology;  Laterality: N/A;  Social History:  reports that he quit smoking about 14 years ago. His smoking use included cigarettes. He has a 114.00 pack-year smoking history. He quit smokeless tobacco use about 14 years ago.  His smokeless tobacco use included chew. He reports previous alcohol use. He reports that he does not use drugs.  Allergies:  Allergies  Allergen Reactions  . Penicillins Hives and Shortness Of Breath    Did it involve swelling of the face/tongue/throat, SOB, or low BP? Yes Did it involve sudden or severe rash/hives, skin peeling, or any reaction on the inside of your mouth or nose? No Did you need to seek medical attention at a hospital or doctor's office? Yes When did it last happen?40 + years If all above answers are "NO", may proceed with cephalosporin use.      Medications Prior to Admission  Medication Sig Dispense Refill  . acetaminophen (TYLENOL) 325 MG tablet Take by mouth every 6 (six) hours as needed for headache.    . albuterol (VENTOLIN HFA) 108 (90 Base) MCG/ACT inhaler Inhale 2 puffs into the lungs every 4 (four) hours as needed for wheezing or shortness of breath.     .  Cyanocobalamin (VITAMIN B-12 IJ) Inject 1,000 mcg as directed every 30 (thirty) days.    Marland Kitchen docusate sodium (COLACE) 100 MG capsule Take 100 mg by mouth See admin instructions. Three times a week    . ELIQUIS 5 MG TABS tablet TAKE 1 TABLET BY MOUTH TWICE A DAY (Patient taking differently: Take 5 mg by mouth 2 (two) times daily. ) 180 tablet 1  . escitalopram (LEXAPRO) 10 MG tablet Take 10 mg by mouth daily.  1  . fenofibrate 160 MG tablet Take 160 mg by mouth daily.      . ferrous sulfate 325 (65 FE) MG tablet Take 325 mg by mouth See admin instructions. Take 1 tablet 2 times a week    . furosemide (LASIX) 20 MG tablet Take 40 mg by mouth daily.     Marland Kitchen gabapentin (NEURONTIN) 300 MG capsule Take 300 mg by mouth 2 (two) times daily.     . Melatonin 10 MG TABS Take 10 mg by mouth at bedtime.     . metFORMIN (GLUCOPHAGE) 500 MG tablet Take 500 mg by mouth 2 (two) times daily.    . metoprolol succinate (TOPROL-XL) 50 MG 24 hr tablet TAKE 1 TABLET BY MOUTH TWICE A DAY (Patient taking differently: Take 50 mg by mouth 2 (two) times daily. ) 60 tablet 3  . naloxone (NARCAN) 4 MG/0.1ML LIQD nasal spray kit Place 1 spray into the nose daily as needed (opioid overdose).    . nitroGLYCERIN (NITROSTAT) 0.4 MG SL tablet Place 1 tablet (0.4 mg total) under the tongue every 5 (five) minutes x 3 doses as needed for chest pain. 25 tablet 1  . OVER THE COUNTER MEDICATION Apply 1 application topically daily as needed (rash). OTC fanny cream    . oxyCODONE-acetaminophen (PERCOCET) 10-325 MG tablet Take 1 tablet by mouth every 4 (four) hours as needed for pain.     . pantoprazole (PROTONIX) 40 MG tablet TAKE 1 TABLET BY MOUTH TWICE A DAY (Patient taking differently: Take 40 mg by mouth 2 (two) times daily. ) 180 tablet 1  . promethazine (PHENERGAN) 25 MG tablet Take 1 tablet (25 mg total) by mouth every 8 (eight) hours as needed for nausea or vomiting. 30 tablet 2  . spironolactone (ALDACTONE) 25 MG tablet Take 25 mg by  mouth daily.  1  . Tamsulosin HCl (FLOMAX) 0.4 MG CAPS Take 0.4 mg by mouth daily.      Marland Kitchen tiZANidine (ZANAFLEX) 4 MG tablet Take 4 mg by mouth 2 (two) times daily. In the evening    . traZODone (DESYREL) 50 MG tablet Take 100 mg by mouth at bedtime.     . OXYGEN Inhale 2 L into the lungs daily as needed (shortness of breath).       Blood pressure 123/74, pulse 84, temperature 98.1 F (36.7 C), temperature source Oral, resp. rate 18, height 6' (1.829 m), weight 103.7 kg, SpO2 91 %. Physical Exam:  General: pleasant, WD, WN white male who is laying in bed in NAD HEENT: head is normocephalic, atraumatic.  Sclera are noninjected.  Pupils are equal.  Mouth is pink and moist Heart: regular, rate, and rhythm.  Normal s1,s2. No obvious murmurs, gallops, or rubs noted.  Palpable radial and pedal pulses bilaterally Lungs: CTAB, no wheezes, rhonchi, or rales noted.  Respiratory effort nonlabored Abd: soft, he has a midline incision lower abdomen from prior partial colectomy.  He has a ventral hernia along the midline.  He is tender and he can actually feel the cyst left lateral upper quadrant.  Positive bowel sounds. MS: all 4 extremities are symmetrical with no cyanosis, clubbing, or edema. Skin: warm and dry with no masses, lesions, or rashes Neuro: Cranial nerves grossly intact, speech is normal Psych: A&Ox3 with an appropriate affect.   Results for orders placed or performed during the hospital encounter of 03/06/19 (from the past 48 hour(s))  CBC with Differential     Status: Abnormal   Collection Time: 03/06/19 11:49 AM  Result Value Ref Range   WBC 5.1 4.0 - 10.5 K/uL   RBC 4.71 4.22 - 5.81 MIL/uL   Hemoglobin 13.0 13.0 - 17.0 g/dL   HCT 41.2 39.0 - 52.0 %   MCV 87.5 80.0 - 100.0 fL   MCH 27.6 26.0 - 34.0 pg   MCHC 31.6 30.0 - 36.0 g/dL   RDW 14.0 11.5 - 15.5 %   Platelets 127 (L) 150 - 400 K/uL    Comment: Immature Platelet Fraction may be clinically indicated, consider ordering this  additional test [id number]    nRBC 0.0 0.0 - 0.2 %   Neutrophils Relative % 83 %   Neutro Abs 4.3 1.7 - 7.7 K/uL   Lymphocytes Relative 8 %   Lymphs Abs 0.4 (L) 0.7 - 4.0 K/uL   Monocytes Relative 7 %   Monocytes Absolute 0.4 0.1 - 1.0 K/uL   Eosinophils Relative 1 %   Eosinophils Absolute 0.0 0.0 - 0.5 K/uL   Basophils Relative 0 %   Basophils Absolute 0.0 0.0 - 0.1 K/uL   Immature Granulocytes 1 %   Abs Immature Granulocytes 0.03 0.00 - 0.07 K/uL    Comment: Performed at [facility], [address] [place], [place] [address]  Comprehensive metabolic panel     Status: Abnormal   Collection Time: 03/06/19 11:49 AM  Result Value Ref Range   Sodium 135 135 - 145 mmol/L   Potassium 3.7 3.5 - 5.1 mmol/L   Chloride 100 98 - 111 mmol/L   CO2 25 22 - 32 mmol/L   Glucose, Bld 132 (H) 70 - 99 mg/dL   BUN 19 8 - 23 mg/dL   Creatinine, Ser 1.26 (H) 0.61 - 1.24 mg/dL   Calcium 9.0 8.9 - 10.3 mg/dL   Total Protein 6.6  6.5 - 8.1 g/dL   Albumin 3.0 (L) 3.5 - 5.0 g/dL   AST 15 15 - 41 U/L   ALT 14 0 - 44 U/L   Alkaline Phosphatase 84 38 - 126 U/L   Total Bilirubin 0.5 0.3 - 1.2 mg/dL   GFR calc non Af Amer 59 (L) >60 mL/min   GFR calc Af Amer >60 >60 mL/min   Anion gap 10 5 - 15    Comment: Performed at [facility], [address] [place], [place] [address]  Lipase, blood     Status: None   Collection Time: 03/06/19 11:49 AM  Result Value Ref Range   Lipase 31 11 - 51 U/L    Comment: Performed at [facility], [address] [place], [place] [address]  CBG monitoring, ED     Status: Abnormal   Collection Time: 03/06/19  7:37 PM  Result Value Ref Range   Glucose-Capillary 123 (H) 70 - 99 mg/dL  Comprehensive metabolic panel     Status: Abnormal   Collection Time: 03/07/19  4:25 AM  Result Value Ref Range   Sodium 139 135 - 145 mmol/L   Potassium 3.9 3.5 - 5.1 mmol/L   Chloride 105 98 - 111 mmol/L   CO2 26 22 - 32 mmol/L   Glucose, Bld 125 (H)  70 - 99 mg/dL   BUN 13 8 - 23 mg/dL   Creatinine, Ser 1.02 0.61 - 1.24 mg/dL   Calcium 8.3 (L) 8.9 - 10.3 mg/dL   Total Protein 5.9 (L) 6.5 - 8.1 g/dL   Albumin 2.7 (L) 3.5 - 5.0 g/dL   AST 10 (L) 15 - 41 U/L   ALT 13 0 - 44 U/L   Alkaline Phosphatase 66 38 - 126 U/L   Total Bilirubin 1.0 0.3 - 1.2 mg/dL   GFR calc non Af Amer >60 >60 mL/min   GFR calc Af Amer >60 >60 mL/min   Anion gap 8 5 - 15    Comment: Performed at [facility], [place] [address] [place], [place] [address]  CBC     Status: Abnormal   Collection Time: 03/07/19  4:25 AM  Result Value Ref Range   WBC 3.3 (L) 4.0 - 10.5 K/uL   RBC 3.93 (L) 4.22 - 5.81 MIL/uL   Hemoglobin 10.9 (L) 13.0 - 17.0 g/dL   HCT 35.0 (L) 39.0 - 52.0 %   MCV 89.1 80.0 - 100.0 fL   MCH 27.7 26.0 - 34.0 pg   MCHC 31.1 30.0 - 36.0 g/dL   RDW 14.1 11.5 - 15.5 %   Platelets 93 (L) 150 - 400 K/uL    Comment: REPEATED TO VERIFY Immature Platelet Fraction may be clinically indicated, consider ordering this additional test [id number] CONSISTENT WITH PREVIOUS RESULT    nRBC 0.0 0.0 - 0.2 %    Comment: Performed at [facility], [place] [address], [place], [place] [address]   CT ABDOMEN PELVIS W CONTRAST  Addendum Date: 03/06/2019   ADDENDUM REPORT: 03/06/2019 14:08 ADDENDUM: The wall thickening involving the proximal descending colon is compatible with secondary involvement by the adjacent inflammatory changes. Electronically Signed   By: Claudie Revering M.D.   On: 03/06/2019 14:08   Result Date: 03/06/2019 CLINICAL DATA:  Mid to left abdominal pain since discharge from the hospital in late January 2021. EXAM: CT ABDOMEN AND PELVIS WITH CONTRAST TECHNIQUE: Multidetector CT imaging of the abdomen and pelvis was performed  using the standard protocol following bolus administration of intravenous contrast. CONTRAST:  170m OMNIPAQUE IOHEXOL 300 MG/ML  SOLN COMPARISON:  02/20/2019 FINDINGS: Lower chest: Normal sized heart.  Clear lung bases. No lung nodules seen today. Hepatobiliary: Stable diffuse low density of the liver relative to the spleen with enlargement of the caudate lobe and lateral segment of the left lobe of the liver. Cholecystectomy clips. Pancreas: The previously demonstrated 9.5 x 8.1 cm cyst in the tail of the pancreas currently measures 12.0 x 8.7 cm in maximum dimensions. A small area of adjacent oval low density in the tail of the pancreas is unchanged. Mild peripancreatic soft tissue stranding with improvement with the exception of increased soft tissue stranding adjacent to the inferior aspect of the large cyst in the tail of the pancreas with interval mild, ill-defined wall thickening of that portion of the cyst. The cyst is partially encasing the splenic artery and splenic veins. There is also a smaller cystic area lateral to the larger cyst, inferior to the spleen weight is has increased mildly in size with mildly increased surrounding wall thickness with enhancement and adjacent soft tissue stranding. This smaller cyst measures 1.6 cm in maximum diameter on coronal image number 45 series 5. Spleen: No significant change in diffuse enlargement of the spleen Adrenals/Urinary Tract: Normal appearing adrenal glands. Previously demonstrated bilateral renal cysts. Unremarkable urinary bladder and ureters. Stomach/Bowel: Proximal sigmoid colon anastomosis. Mild soft tissue thickening involving the walls of the proximal descending colon adjacent to the soft tissue stranding associated with the inferior portion of the large pancreatic tail cyst. Multiple diverticula at the junction of the descending colon and sigmoid colon. Normal appearing appendix. Unremarkable stomach and small bowel. Vascular/Lymphatic: Large number of upper abdominal varices. Atheromatous calcifications without aneurysm. No enlarged lymph nodes. Reproductive: Mildly enlarged prostate gland containing coarse calcifications. Other: Tiny umbilical  hernia containing fat. Small right hydrocele. Musculoskeletal: Right hip prosthesis. L5-S1 interbody and pedicle screw and rod fusion. Lumbar and lower thoracic spine degenerative changes. IMPRESSION: 1. Interval mild increase in size of a large pancreatic tail pseudocyst with mildly increased surrounding soft tissue stranding and wall thickening inferiorly. This is compatible with interval inflammatory/infectious changes involving the inferior portion of the cyst and an adjacent 1.6 cm component. 2. Stable changes of cirrhosis of the liver with portal venous hypertension, including splenomegaly and large number of upper abdominal varices. 3. Colonic diverticulosis. 4. Small right hydrocele. Electronically Signed: By: SClaudie ReveringM.D. On: 03/06/2019 13:34      Assessment/Plan Enlarging/painful pancreatic pseudocyst Recurrent idiopathic pancreatitis Hx alcohol use/cryptogenic cirrhosis/ascites Esophageal varices/portal hypertension CAD s/p stent 2006 Atrial fibrillation -on Eliquis DJD/chronic lower back pain COPD/sleep apnea Hypertension Type 2 diabetes Stage III CKD Hyperlipidemia Hx colon resection 2015/diverticulitis  Plan: Dr. DLoletha Carrowis looking for a way to drain to reduce this pseudocyst.  He is unable to do it endoscopically.  There is a consult to IR and our service General surgery to look for options.  We have also forwarded this information to Dr. BBarry Dienesto review also.  We will follow with you.      JEarnstine RegalPAdventhealth TampaSurgery 03/07/2019, 10:46 AM Please see Amion for pager number during day hours 7:00am-4:30pm

## 2019-03-07 NOTE — Progress Notes (Signed)
PROGRESS NOTE    Steve Anthony  QQP:[id number] DOB: 02-16-1950 DOA: 03/06/2019 PCP: [name], MD   Brief Narrative:  Steve Anthony is Steve Anthony 69 y.o. male with medical history significant of chronic pancreatitis, coronary disease, Covid infection in December 2020, pancreatic cyst, chronic back pain comes in with several days of epigastric pain and back pain with Steve Anthony lot of nausea and retching at home.  He does not drink alcohol at all.  He has not been vomiting blood.  He denies any fevers.  He denies any diarrhea.  Patient was sent to [facility] from the [place] for work-up.  His lipase is normal patient found to have an enlarging pancreatic cyst.  GI advised medical admission for them to see in the morning.  Patient is in Steve Anthony lot of pain right now as he is not had any of his oral pain meds since 830 this morning.  He reports he is not been given any IV pain meds either.  He reports Dilaudid works well for him.  He takes oxycodone 10 mg tablets every 4 hours at home.  Patient be referred for admission for acute on chronic pancreatitis episode.  Assessment & Plan:   Principal Problem:   Pseudocyst of pancreas Active Problems:   COPD without exacerbation ([place])   Cirrhosis of liver without ascites ([place])   Recurrent pancreatitis   CKD (chronic kidney disease), stage III   History of COVID-19 01/24/19  Abdominal Pain  History of Acute Pancreatitis c/b Pancreatic Pseudocyst:  Appreciate GI c/s Lipase at presentation wnl  CT on presentation with increase in size of large pancreatic tail pseudocystic with increased surrounding soft tissue stranding and wall thickening as well as stable changes of cirrhosis of the liver Appreciate Surgery and IR recommendations - notes pending Continue home oxycodone regimen with IV pain meds for breakthrough - adjust as needed  Cirrhosis c/b Portal Hypertension Holding diuretics for now Follows with Dr. Lyndel Safe outpatient  Atrial Fibrillation: on  eliquis at home, per med rec, appears last dose was 2/9 AM.  Continue home metoprolol.  COPD: prn inhalers, no resp symptoms at this time  Depression  Anxiety: continue lexapro, trazodone, melatonin  T2DM: continue SSI.  Hold home metformin.  BG reasonable on CMP this AM.   BPH: continue flomax  GERD: continue PPI  Thrombocytopenia: chronic in setting of cirrhosis, follow  CKD III: creatinine improved from admission and from baseline, continue to monitor  DVT prophylaxis: SCD Code Status: full  Family Communication: none at bedside Disposition Plan:  . Patient came from: home           . Anticipated d/c place: home . Barriers to d/c OR conditions which need to be met to effect [name] safe d/c: pending further eval by consultants   Consultants:   GI  IR  Surgery  Procedures:  none  Antimicrobials:  Anti-infectives (From admission, onward)   None     Subjective: C/o abdominal pain About the same  Objective: Vitals:   03/06/19 2043 03/07/19 0500 03/07/19 0639 03/07/19 1017  BP: 114/81  123/74   Pulse: 79  84   Resp: 18  18   Temp: 98.7 F (37.1 C)  98.4 F (36.9 C) 98.1 F (36.7 C)  TempSrc: Oral  Oral Oral  SpO2: 92%  91%   Weight:  103.7 kg    Height:        Intake/Output Summary (Last 24 hours) at 03/07/2019 1428 Last data  filed at 03/07/2019 0949 Gross per 24 hour  Intake 679.18 ml  Output 600 ml  Net 79.18 ml   Filed Weights   03/06/19 1123 03/07/19 0500  Weight: 103.9 kg 103.7 kg    Examination:  General exam: Appears calm and comfortable  Respiratory system: Clear to auscultation. Respiratory effort normal. Cardiovascular system: S1 & S2 heard, RRR.  Gastrointestinal system: Abdomen is nondistended, soft and nontender.  Central nervous system: Alert and oriented. No focal neurological deficits. Extremities: no LEE Skin: No rashes, lesions or ulcers Psychiatry: Judgement and insight appear normal. Mood & affect appropriate.     Data  Reviewed: I have personally reviewed following labs and imaging studies  CBC: Recent Labs  Lab 03/06/19 1149 03/07/19 0425  WBC 5.1 3.3*  NEUTROABS 4.3  --   HGB 13.0 10.9*  HCT 41.2 35.0*  MCV 87.5 89.1  PLT 127* 93*   Basic Metabolic Panel: Recent Labs  Lab 03/06/19 1149 03/07/19 0425  NA 135 139  K 3.7 3.9  CL 100 105  CO2 25 26  GLUCOSE 132* 125*  BUN 19 13  CREATININE 1.26* 1.02  CALCIUM 9.0 8.3*   GFR: Estimated Creatinine Clearance: 87.5 mL/min (by C-G formula based on SCr of 1.02 mg/dL). Liver Function Tests: Recent Labs  Lab 03/06/19 1149 03/07/19 0425  AST 15 10*  ALT 14 13  ALKPHOS 84 66  BILITOT 0.5 1.0  PROT 6.6 5.9*  ALBUMIN 3.0* 2.7*   Recent Labs  Lab 03/06/19 1149  LIPASE 31   No results for input(s): AMMONIA in the last 168 hours. Coagulation Profile: Recent Labs  Lab 03/07/19 1118  INR 1.6*   Cardiac Enzymes: No results for input(s): CKTOTAL, CKMB, CKMBINDEX, TROPONINI in the last 168 hours. BNP (last 3 results) No results for input(s): PROBNP in the last 8760 hours. HbA1C: No results for input(s): HGBA1C in the last 72 hours. CBG: Recent Labs  Lab 03/06/19 1937  GLUCAP 123*   Lipid Profile: No results for input(s): CHOL, HDL, LDLCALC, TRIG, CHOLHDL, LDLDIRECT in the last 72 hours. Thyroid Function Tests: No results for input(s): TSH, T4TOTAL, FREET4, T3FREE, THYROIDAB in the last 72 hours. Anemia Panel: No results for input(s): VITAMINB12, FOLATE, FERRITIN, TIBC, IRON, RETICCTPCT in the last 72 hours. Sepsis Labs: No results for input(s): PROCALCITON, LATICACIDVEN in the last 168 hours.  No results found for this or any previous visit (from the past 240 hour(s)).       Radiology Studies: CT ABDOMEN PELVIS W CONTRAST  Addendum Date: 03/06/2019   ADDENDUM REPORT: 03/06/2019 14:08 ADDENDUM: The wall thickening involving the proximal descending colon is compatible with secondary involvement by the adjacent inflammatory  changes. Electronically Signed   By: Claudie Revering M.D.   On: 03/06/2019 14:08   Result Date: 03/06/2019 CLINICAL DATA:  Mid to left abdominal pain since discharge from the hospital in late January 2021. EXAM: CT ABDOMEN AND PELVIS WITH CONTRAST TECHNIQUE: Multidetector CT imaging of the abdomen and pelvis was performed using the standard protocol following bolus administration of intravenous contrast. CONTRAST:  130m OMNIPAQUE IOHEXOL 300 MG/ML  SOLN COMPARISON:  02/20/2019 FINDINGS: Lower chest: Normal sized heart. Clear lung bases. No lung nodules seen today. Hepatobiliary: Stable diffuse low density of the liver relative to the spleen with enlargement of the caudate lobe and lateral segment of the left lobe of the liver. Cholecystectomy clips. Pancreas: The previously demonstrated 9.5 x 8.1 cm cyst in the tail of the pancreas currently measures 12.0 x  8.7 cm in maximum dimensions. Jordain Radin small area of adjacent oval low density in the tail of the pancreas is unchanged. Mild peripancreatic soft tissue stranding with improvement with the exception of increased soft tissue stranding adjacent to the inferior aspect of the large cyst in the tail of the pancreas with interval mild, ill-defined wall thickening of that portion of the cyst. The cyst is partially encasing the splenic artery and splenic veins. There is also Chip Canepa smaller cystic area lateral to the larger cyst, inferior to the spleen weight is has increased mildly in size with mildly increased surrounding wall thickness with enhancement and adjacent soft tissue stranding. This smaller cyst measures 1.6 cm in maximum diameter on coronal image number 45 series 5. Spleen: No significant change in diffuse enlargement of the spleen Adrenals/Urinary Tract: Normal appearing adrenal glands. Previously demonstrated bilateral renal cysts. Unremarkable urinary bladder and ureters. Stomach/Bowel: Proximal sigmoid colon anastomosis. Mild soft tissue thickening involving the  walls of the proximal descending colon adjacent to the soft tissue stranding associated with the inferior portion of the large pancreatic tail cyst. Multiple diverticula at the junction of the descending colon and sigmoid colon. Normal appearing appendix. Unremarkable stomach and small bowel. Vascular/Lymphatic: Large number of upper abdominal varices. Atheromatous calcifications without aneurysm. No enlarged lymph nodes. Reproductive: Mildly enlarged prostate gland containing coarse calcifications. Other: Tiny umbilical hernia containing fat. Small right hydrocele. Musculoskeletal: Right hip prosthesis. L5-S1 interbody and pedicle screw and rod fusion. Lumbar and lower thoracic spine degenerative changes. IMPRESSION: 1. Interval mild increase in size of Khara Renaud large pancreatic tail pseudocyst with mildly increased surrounding soft tissue stranding and wall thickening inferiorly. This is compatible with interval inflammatory/infectious changes involving the inferior portion of the cyst and an adjacent 1.6 cm component. 2. Stable changes of cirrhosis of the liver with portal venous hypertension, including splenomegaly and large number of upper abdominal varices. 3. Colonic diverticulosis. 4. Small right hydrocele. Electronically Signed: By: Claudie Revering M.D. On: 03/06/2019 13:34        Scheduled Meds: Continuous Infusions: . sodium chloride 125 mL/hr at 03/07/19 0641     LOS: 1 day    Time spent: over 30 min    Fayrene Helper, MD [facility]   To contact the attending provider between 7A-7P or the covering provider during after hours 7P-7A, please log into the web site [email or web address] and access using universal [place] password for that web site. If you do not have the password, please call the hospital operator.  03/07/2019, 2:28 PM

## 2019-03-08 ENCOUNTER — Inpatient Hospital Stay (HOSPITAL_COMMUNITY): Payer: Medicare PPO

## 2019-03-08 ENCOUNTER — Ambulatory Visit: Payer: Medicare PPO | Admitting: Gastroenterology

## 2019-03-08 ENCOUNTER — Encounter (HOSPITAL_COMMUNITY): Payer: Self-pay | Admitting: Radiology

## 2019-03-08 LAB — CBC WITH DIFFERENTIAL/PLATELET
Abs Immature Granulocytes: 0.02 10*3/uL (ref 0.00–0.07)
Basophils Absolute: 0 10*3/uL (ref 0.0–0.1)
Basophils Relative: 0 %
Eosinophils Absolute: 0 10*3/uL (ref 0.0–0.5)
Eosinophils Relative: 1 %
HCT: 35.9 % — ABNORMAL LOW (ref 39.0–52.0)
Hemoglobin: 11 g/dL — ABNORMAL LOW (ref 13.0–17.0)
Immature Granulocytes: 1 %
Lymphocytes Relative: 12 %
Lymphs Abs: 0.4 10*3/uL — ABNORMAL LOW (ref 0.7–4.0)
MCH: 27.4 pg (ref 26.0–34.0)
MCHC: 30.6 g/dL (ref 30.0–36.0)
MCV: 89.5 fL (ref 80.0–100.0)
Monocytes Absolute: 0.3 10*3/uL (ref 0.1–1.0)
Monocytes Relative: 9 %
Neutro Abs: 2.6 10*3/uL (ref 1.7–7.7)
Neutrophils Relative %: 77 %
Platelets: 106 10*3/uL — ABNORMAL LOW (ref 150–400)
RBC: 4.01 MIL/uL — ABNORMAL LOW (ref 4.22–5.81)
RDW: 13.9 % (ref 11.5–15.5)
WBC: 3.3 10*3/uL — ABNORMAL LOW (ref 4.0–10.5)
nRBC: 0 % (ref 0.0–0.2)

## 2019-03-08 LAB — COMPREHENSIVE METABOLIC PANEL
ALT: 12 U/L (ref 0–44)
AST: 12 U/L — ABNORMAL LOW (ref 15–41)
Albumin: 2.7 g/dL — ABNORMAL LOW (ref 3.5–5.0)
Alkaline Phosphatase: 61 U/L (ref 38–126)
Anion gap: 8 (ref 5–15)
BUN: 10 mg/dL (ref 8–23)
CO2: 27 mmol/L (ref 22–32)
Calcium: 8.5 mg/dL — ABNORMAL LOW (ref 8.9–10.3)
Chloride: 104 mmol/L (ref 98–111)
Creatinine, Ser: 1.06 mg/dL (ref 0.61–1.24)
GFR calc Af Amer: >60 mL/min (ref >60)
GFR calc non Af Amer: >60 mL/min (ref >60)
Glucose, Bld: 126 mg/dL — ABNORMAL HIGH (ref 70–99)
Potassium: 4 mmol/L (ref 3.5–5.1)
Sodium: 139 mmol/L (ref 135–145)
Total Bilirubin: 0.8 mg/dL (ref 0.3–1.2)
Total Protein: 6 g/dL — ABNORMAL LOW (ref 6.5–8.1)

## 2019-03-08 LAB — BODY FLUID CELL COUNT WITH DIFFERENTIAL: Other Cells, Fluid: UNDETERMINED %

## 2019-03-08 LAB — GLUCOSE, CAPILLARY
Glucose-Capillary: 114 mg/dL — ABNORMAL HIGH (ref 70–99)
Glucose-Capillary: 139 mg/dL — ABNORMAL HIGH (ref 70–99)
Glucose-Capillary: 163 mg/dL — ABNORMAL HIGH (ref 70–99)
Glucose-Capillary: 131 mg/dL — ABNORMAL HIGH (ref 70–99)

## 2019-03-08 LAB — PROTIME-INR
INR: 1.6 — ABNORMAL HIGH (ref 0.8–1.2)
Prothrombin Time: 19.3 seconds — ABNORMAL HIGH (ref 11.4–15.2)

## 2019-03-08 LAB — MAGNESIUM: Magnesium: 1.1 mg/dL — ABNORMAL LOW (ref 1.7–2.4)

## 2019-03-08 LAB — PHOSPHORUS: Phosphorus: 3.6 mg/dL (ref 2.5–4.6)

## 2019-03-08 MED ORDER — METRONIDAZOLE IN NACL 5-0.79 MG/ML-% IV SOLN
500.0000 mg | Freq: Three times a day (TID) | INTRAVENOUS | Status: DC
  Administered 2019-03-08 – 2019-03-11 (×9): 500 mg via INTRAVENOUS
  Filled 2019-03-08 (×9): qty 100

## 2019-03-08 MED ORDER — MAGNESIUM SULFATE 2 GM/50ML IV SOLN
2.0000 g | Freq: Once | INTRAVENOUS | Status: AC
  Administered 2019-03-08: 2 g via INTRAVENOUS
  Filled 2019-03-08: qty 50

## 2019-03-08 MED ORDER — MIDAZOLAM HCL 2 MG/2ML IJ SOLN
INTRAMUSCULAR | Status: AC
  Filled 2019-03-08: qty 4

## 2019-03-08 MED ORDER — MIDAZOLAM HCL 2 MG/2ML IJ SOLN
INTRAMUSCULAR | Status: AC | PRN
  Administered 2019-03-08 (×3): 1 mg via INTRAVENOUS

## 2019-03-08 MED ORDER — FENTANYL CITRATE (PF) 100 MCG/2ML IJ SOLN
INTRAMUSCULAR | Status: AC | PRN
  Administered 2019-03-08 (×2): 50 ug via INTRAVENOUS

## 2019-03-08 MED ORDER — ENSURE ENLIVE PO LIQD
237.0000 mL | Freq: Two times a day (BID) | ORAL | Status: DC
  Administered 2019-03-10 (×2): 237 mL via ORAL

## 2019-03-08 MED ORDER — FENTANYL CITRATE (PF) 100 MCG/2ML IJ SOLN
INTRAMUSCULAR | Status: AC
  Filled 2019-03-08: qty 2

## 2019-03-08 MED ORDER — LIDOCAINE HCL (PF) 1 % IJ SOLN
INTRAMUSCULAR | Status: AC | PRN
  Administered 2019-03-08: 10 mL

## 2019-03-08 MED ORDER — ADULT MULTIVITAMIN W/MINERALS CH
1.0000 | ORAL_TABLET | Freq: Every day | ORAL | Status: DC
  Administered 2019-03-09 – 2019-03-11 (×3): 1 via ORAL
  Filled 2019-03-08 (×3): qty 1

## 2019-03-08 MED ORDER — LEVOFLOXACIN IN D5W 750 MG/150ML IV SOLN
750.0000 mg | Freq: Every day | INTRAVENOUS | Status: DC
  Administered 2019-03-08 – 2019-03-11 (×4): 750 mg via INTRAVENOUS
  Filled 2019-03-08 (×4): qty 150

## 2019-03-08 MED ORDER — MAGNESIUM SULFATE 4 GM/100ML IV SOLN
4.0000 g | Freq: Once | INTRAVENOUS | Status: AC
  Administered 2019-03-08: 4 g via INTRAVENOUS
  Filled 2019-03-08: qty 100

## 2019-03-08 NOTE — Progress Notes (Signed)
[facility] Progress Note  Chief Complaint: LUQ pain, pancreatic cyst  History:  Had cyst aspirated in IR today - 200cc dark fluid removed.  Reportedly sent for cell count and culture (though not for lipase /cytology as hoped for) His pain is greatly diminished.  Hungry.  ROS: Cardiovascular:  No chest pain Respiratory:  No dyspnea Urinary:  No dysuria  Objective:   Current Facility-Administered Medications:  .  acetaminophen (TYLENOL) tablet 650 mg, 650 mg, Oral, Q6H PRN, 650 mg at 03/08/19 0544 **OR** acetaminophen (TYLENOL) suppository 650 mg, 650 mg, Rectal, Q6H PRN, Derrill Kay A, MD .  albuterol (PROVENTIL) (2.5 MG/3ML) 0.083% nebulizer solution 3 mL, 3 mL, Inhalation, Q4H PRN, Elodia Florence., MD .  escitalopram (LEXAPRO) tablet 10 mg, 10 mg, Oral, Daily, Elodia Florence., MD, 10 mg at 03/08/19 1136 .  fentaNYL (SUBLIMAZE) 100 MCG/2ML injection, , , ,  .  gabapentin (NEURONTIN) capsule 300 mg, 300 mg, Oral, BID, Elodia Florence., MD, 300 mg at 03/08/19 1136 .  HYDROmorphone (DILAUDID) injection 1 mg, 1 mg, Intravenous, Q3H PRN, Elodia Florence., MD, 1 mg at 03/08/19 0534 .  insulin aspart (novoLOG) injection 0-5 Units, 0-5 Units, Subcutaneous, QHS, Powell, A Clint Lipps., MD .  insulin aspart (novoLOG) injection 0-9 Units, 0-9 Units, Subcutaneous, TID WC, Elodia Florence., MD, 1 Units at 03/08/19 1252 .  magnesium sulfate IVPB 2 g 50 mL, 2 g, Intravenous, Once, Elodia Florence., MD .  Melatonin TABS 10 mg, 10 mg, Oral, QHS, Elodia Florence., MD, 10 mg at 03/07/19 2147 .  metoprolol succinate (TOPROL-XL) 24 hr tablet 50 mg, 50 mg, Oral, BID, Elodia Florence., MD, 50 mg at 03/07/19 2147 .  midazolam (VERSED) 2 MG/2ML injection, , , ,  .  ondansetron (ZOFRAN) tablet 4 mg, 4 mg, Oral, Q6H PRN **OR** ondansetron (ZOFRAN) injection 4 mg, 4 mg, Intravenous, Q6H PRN, Phillips Grout, MD, 4 mg at 03/07/19 1025 .  oxyCODONE (Oxy  IR/ROXICODONE) immediate release tablet 10 mg, 10 mg, Oral, Q4H PRN, Derrill Kay A, MD, 10 mg at 03/08/19 1146 .  pantoprazole (PROTONIX) EC tablet 40 mg, 40 mg, Oral, BID, Elodia Florence., MD, 40 mg at 03/08/19 1136 .  tamsulosin (FLOMAX) capsule 0.4 mg, 0.4 mg, Oral, Daily, Elodia Florence., MD, 0.4 mg at 03/08/19 1136 .  tiZANidine (ZANAFLEX) tablet 4 mg, 4 mg, Oral, BID, Elodia Florence., MD, 4 mg at 03/08/19 1135 .  traZODone (DESYREL) tablet 100 mg, 100 mg, Oral, QHS, Elodia Florence., MD, 100 mg at 03/07/19 2147  . magnesium sulfate bolus IVPB       Vital signs in last 24 hrs: Vitals:   03/08/19 1045 03/08/19 1115  BP: 94/67 99/76  Pulse: 73 73  Resp: 15 16  Temp:  99 F (37.2 C)  SpO2: 98% 92%    Intake/Output Summary (Last 24 hours) at 03/08/2019 1413 Last data filed at 03/08/2019 1000 Gross per 24 hour  Intake 1325.41 ml  Output 200 ml  Net 1125.41 ml     Physical Exam   HEENT: sclera anicteric, oral mucosa without lesions  Neck: supple, no thyromegaly, JVD or lymphadenopathy  Cardiac: RRR without murmurs, S1S2 heard, no peripheral edema  Pulm: clear to auscultation bilaterally, normal RR and effort noted  Abdomen: soft, mild LUQ tenderness, with active bowel sounds. Spleen tip palpable, no longer has the visible and palpable fullness  Skin; warm and dry, nojaundice  Recent Labs:  CBC Latest Ref Rng & Units 03/08/2019 03/07/2019 03/06/2019  WBC 4.0 - 10.5 K/uL 3.3(L) 3.3(L) 5.1  Hemoglobin 13.0 - 17.0 g/dL 11.0(L) 10.9(L) 13.0  Hematocrit 39.0 - 52.0 % 35.9(L) 35.0(L) 41.2  Platelets 150 - 400 K/uL 106(L) 93(L) 127(L)    Recent Labs  Lab 03/08/19 0426  INR 1.6*   CMP Latest Ref Rng & Units 03/08/2019 03/07/2019 03/06/2019  Glucose 70 - 99 mg/dL 126(H) 125(H) 132(H)  BUN 8 - 23 mg/dL 10 13 19   Creatinine 0.61 - 1.24 mg/dL 1.06 1.02 1.26(H)  Sodium 135 - 145 mmol/L 139 139 135  Potassium 3.5 - 5.1 mmol/L 4.0 3.9 3.7  Chloride  98 - 111 mmol/L 104 105 100  CO2 22 - 32 mmol/L 27 26 25   Calcium 8.9 - 10.3 mg/dL 8.5(L) 8.3(L) 9.0  Total Protein 6.5 - 8.1 g/dL 6.0(L) 5.9(L) 6.6  Total Bilirubin 0.3 - 1.2 mg/dL 0.8 1.0 0.5  Alkaline Phos 38 - 126 U/L 61 66 84  AST 15 - 41 U/L 12(L) 10(L) 15  ALT 0 - 44 U/L 12 13 14      Radiologic studies:  IR report reviewed  @ASSESSMENTPLANBEGIN @ Assessment: LUQ pain Pancreatic cyst - enlarging and causing symptoms - now aspirated/decompressed. CIrrhosis and portal HTN   Plan: Soft diet today Follow up test results.  If suggest infection, begin Abx (though Abx penetration of such a structure likely to be limited) If no complications of procedure by tomorrow AM, likely can be discharged home to follow up closely with Dr. Lyndel Safe.  Total time 20 minutes Nelida Meuse III Office: [phone]

## 2019-03-08 NOTE — Progress Notes (Signed)
PROGRESS NOTE    Steve Anthony  IAX:[id number] DOB: 1950/07/17 DOA: 03/06/2019 PCP: [name], MD   Brief Narrative:  Steve Anthony is a 69 y.o. male with medical history significant of chronic pancreatitis, coronary disease, Covid infection in December 2020, pancreatic cyst, chronic back pain comes in with several days of epigastric pain and back pain with a lot of nausea and retching at home.  He does not drink alcohol at all.  He has not been vomiting blood.  He denies any fevers.  He denies any diarrhea.  Patient was sent to [facility] from the [place] for work-up.  His lipase is normal patient found to have an enlarging pancreatic cyst.  GI advised medical admission for them to see in the morning.  Patient is in a lot of pain right now as he is not had any of his oral pain meds since 830 this morning.  He reports he is not been given any IV pain meds either.  He reports Dilaudid works well for him.  He takes oxycodone 10 mg tablets every 4 hours at home.  Patient be referred for admission for acute on chronic pancreatitis episode.  Assessment & Plan:   Principal Problem:   Pseudocyst of pancreas Active Problems:   COPD without exacerbation ([place])   Cirrhosis of liver without ascites ([place])   Recurrent pancreatitis   CKD (chronic kidney disease), stage III   History of COVID-19 01/24/19  Abdominal Pain  History of Acute Pancreatitis c/b Pancreatic Pseudocyst:  Appreciate GI c/s S/p CT aspiration of pancreatitic pseudocyst on 2/11 with 200 ml brown opaque fluid Follow culture -> with few gram positive cocci in chains -> with penicillin allergy, will start levaquin Follow pending amylase, cell count, cytology (pending add on, has been discussed with lab) Lipase at presentation wnl  CT on presentation with increase in size of large pancreatic tail pseudocystic with increased surrounding soft tissue stranding and wall thickening as well as stable changes of cirrhosis of  the liver Appreciate Surgery recommendations - they were planning to discuss with Dr. [name] Continue home oxycodone regimen with IV pain meds for breakthrough - adjust as needed  Cirrhosis c/b Portal Hypertension Holding diuretics for now - BP soft Follows with Dr. Lyndel Safe outpatient  Atrial Fibrillation: on eliquis at home, per med rec, appears last dose was 2/9 AM.  Continue home metoprolol.  COPD: prn inhalers, no resp symptoms at this time  Depression  Anxiety: continue lexapro, trazodone, melatonin  T2DM: continue SSI.  Hold home metformin.  BG reasonable on CMP this AM.   BPH: continue flomax  GERD: continue PPI  Thrombocytopenia: chronic in setting of cirrhosis, follow  CKD III: creatinine improved from admission and from baseline, continue to monitor  Hypomagnesemia: replace and follow  DVT prophylaxis: SCD Code Status: full  Family Communication: none at bedside Disposition Plan:  . Patient came from: home           . Anticipated d/c place: home . Barriers to d/c OR conditions which need to be met to effect a safe d/c: pending further eval by consultants   Consultants:   GI  IR  Surgery  Procedures:  none  Antimicrobials:  Anti-infectives (From admission, onward)   None     Subjective: Feels better after procedure  Objective: Vitals:   03/08/19 1028 03/08/19 1040 03/08/19 1045 03/08/19 1115  BP: 105/64 101/64 94/67 99/76  Pulse: 70 72 73 73  Resp: 15 16  15 16  Temp:    99 F (37.2 C)  TempSrc:    Oral  SpO2: 98% 98% 98% 92%  Weight:      Height:        Intake/Output Summary (Last 24 hours) at 03/08/2019 1539 Last data filed at 03/08/2019 1000 Gross per 24 hour  Intake 1325.41 ml  Output 200 ml  Net 1125.41 ml   Filed Weights   03/06/19 1123 03/07/19 0500  Weight: 103.9 kg 103.7 kg    Examination:  General: No acute distress. Cardiovascular: Heart sounds show a regular rate, and rhythm.  Lungs: Clear to auscultation  bilaterally. Abdomen: Soft, nontender, nondistended  Neurological: Alert and oriented 3. Moves all extremities 4 Cranial nerves II through XII grossly intact. Skin: Warm and dry. No rashes or lesions. Extremities: No clubbing or cyanosis. No edema.   Data Reviewed: I have personally reviewed following labs and imaging studies  CBC: Recent Labs  Lab 03/06/19 1149 03/07/19 0425 03/08/19 0426  WBC 5.1 3.3* 3.3*  NEUTROABS 4.3  --  2.6  HGB 13.0 10.9* 11.0*  HCT 41.2 35.0* 35.9*  MCV 87.5 89.1 89.5  PLT 127* 93* 076*   Basic Metabolic Panel: Recent Labs  Lab 03/06/19 1149 03/07/19 0425 03/08/19 0426  NA 135 139 139  K 3.7 3.9 4.0  CL 100 105 104  CO2 _0 GLUCOSE 132* 125* 126*  BUN _1 CREATININE 1.26* 1.02 1.06  CALCIUM 9.0 8.3* 8.5*  MG  --   --  1.1*  PHOS  --   --  3.6   GFR: Estimated Creatinine Clearance: 84.2 mL/min (by C-G formula based on SCr of 1.06 mg/dL). Liver Function Tests: Recent Labs  Lab 03/06/19 1149 03/07/19 0425 03/08/19 0426  AST 15 10* 12*  ALT _2 ALKPHOS 84 66 61  BILITOT 0.5 1.0 0.8  PROT 6.6 5.9* 6.0*  ALBUMIN 3.0* 2.7* 2.7*   Recent Labs  Lab 03/06/19 1149  LIPASE 31   No results for input(s): AMMONIA in the last 168 hours. Coagulation Profile: Recent Labs  Lab 03/07/19 1118 03/08/19 0426  INR 1.6* 1.6*   Cardiac Enzymes: No results for input(s): CKTOTAL, CKMB, CKMBINDEX, TROPONINI in the last 168 hours. BNP (last 3 results) No results for input(s): PROBNP in the last 8760 hours. HbA1C: No results for input(s): HGBA1C in the last 72 hours. CBG: Recent Labs  Lab 03/06/19 1937 03/07/19 1719 03/07/19 1933 03/08/19 0757 03/08/19 1206  GLUCAP 123* 148* 108* 114* 139*   Lipid Profile: No results for input(s): CHOL, HDL, LDLCALC, TRIG, CHOLHDL, LDLDIRECT in the last 72 hours. Thyroid Function Tests: No results for input(s): TSH, T4TOTAL, FREET4, T3FREE, THYROIDAB in the last 72 hours. Anemia  Panel: No results for input(s): VITAMINB12, FOLATE, FERRITIN, TIBC, IRON, RETICCTPCT in the last 72 hours. Sepsis Labs: No results for input(s): PROCALCITON, LATICACIDVEN in the last 168 hours.  Recent Results (from the past 240 hour(s))  Aerobic/Anaerobic Culture (surgical/deep wound)     Status: None (Preliminary result)   Collection Time: 03/08/19 10:57 AM   Specimen: Wound; Abdominal Fluid  Result Value Ref Range Status   Specimen Description   Final    WOUND PANCREATIC PSEUDOCYST ASPIRATE Performed at [facility] [address] [place], [place] [address]    Special Requests   Final    Normal Performed at [facility], [place] [address], [place], [place] [address]    Gram Stain  Final    FEW WBC PRESENT, PREDOMINANTLY PMN FEW GRAM POSITIVE COCCI IN CHAINS Performed at [facility], [place] [address] [place], [place] [address]    Culture PENDING  Incomplete   Report Status PENDING  Incomplete         Radiology Studies: No results found.      Scheduled Meds: . escitalopram  10 mg Oral Daily  . feeding supplement (ENSURE ENLIVE)  237 mL Oral BID BM  . fentaNYL      . gabapentin  300 mg Oral BID  . insulin aspart  0-5 Units Subcutaneous QHS  . insulin aspart  0-9 Units Subcutaneous TID WC  . Melatonin  10 mg Oral QHS  . metoprolol succinate  50 mg Oral BID  . midazolam      . multivitamin with minerals  1 tablet Oral Daily  . pantoprazole  40 mg Oral BID  . tamsulosin  0.4 mg Oral Daily  . tiZANidine  4 mg Oral BID  . traZODone  100 mg Oral QHS   Continuous Infusions: . magnesium sulfate bolus IVPB 2 g (03/08/19 1443)     LOS: 2 days    Time spent: over 30 min    Fayrene Helper, MD [facility]   To contact the attending provider between 7A-7P or the covering provider during after hours 7P-7A, please log into the web site [email or web address] and access using universal [place] password for that web  site. If you do not have the password, please call the hospital operator.  03/08/2019, 3:39 PM

## 2019-03-08 NOTE — Procedures (Signed)
Interventional Radiology Procedure Note  Procedure: CT aspiration of pancreatic pseudocyst yielding 200 mL brown opaque fluid.   Complications: None  Estimated Blood Loss: None  Recommendations: - Cultures are pending  Signed,  Criselda Peaches, MD

## 2019-03-08 NOTE — Progress Notes (Signed)
Initial Nutrition Assessment  DOCUMENTATION CODES:   Obesity unspecified, Non-severe (moderate) malnutrition in context of chronic illness  INTERVENTION:  -Ensure Enlive po BID, each supplement provides 350 kcal and 20 grams of protein   -MVI with minerals daily  -Monitor for po intake and diet advancement  -High risk for refeeding  NUTRITION DIAGNOSIS:   Moderate Malnutrition related to chronic illness(chronic pancreatitis) as evidenced by energy intake < 75% for > or equal to 1 month, per patient/family report, mild fat depletion, moderate fat depletion, mild muscle depletion, moderate muscle depletion, percent weight loss.   GOAL:   Patient will meet greater than or equal to 90% of their needs   MONITOR:   Supplement acceptance, Diet advancement, Labs, Weight trends, PO intake, I & O's  REASON FOR ASSESSMENT:   Malnutrition Screening Tool    ASSESSMENT:   69 year old male with past medical history significant for chronic pancreatitis, pancreatic cyst, CAD, Covid-19 infection in December, chronic back pain, COPD, GERD, HTN, HLD, paroxysmal a-fib, OSA (noncompliant with CPAP), and DM2 presented to ED with several day history of epigastric and back pain, nausea and vomiting. Patient found to have enlarging pancreatic cyst and admitted for acute on chronic pancreatitis episode.  Patient is s/p IR aspiration of pseudocyst on 2/11   Per notes, pt with history of recurrent acute pancreatitis with symptomatic pseudocyst requiring multiple admissions in 2019-2020. Upon admission patient endorsed 10/10 pain, decreased oral intake, and wt loss.   Patient previously considered for cyst gastrostomy, ultimately decided not a candidate d/t anatomy of blood vessels that surrounded entirety of superior aspect of cyst. GI unable to drain endoscopically, requested surgery and IR evaluation for method of draining enlarging pseudocyst.   Met with patient bedside s/p procedure this afternoon,  wife at bedside. Patient reports feeling better and hungry, menu in patient's hand (wife called in patient's order of chicken soup, jello, and pudding while RD in room) Patient reports that he really has not had much to eat since being discharged on 1/28. He recalls tolerating jello, eggs, and creamed potatoes over the past 2 weeks. Patient reports no po intake d/t intense abdominal pain with eating 3-4 days prior to admission. Given patient's dietary recall, he is at increased risk for refeeding. Recommend monitoring Mg, K, PO4, MD to replete as needed.   Noted BLE mild pitting edema per RN assessment.  Current wt 228.14 lbs UBW 240-244 lbs per pt report Per history, patient has lost 13.42 lbs (5.5%) in the past month which is severe for time frame. On 08/09/18 pt wt 109.3 kg (240.46 lbs), 1/07 pt wt 109.8 kg (241.56 lbs), 1/28 pt wt 105.7 kg (232.54 lbs)   Patient meets criteria for moderate malnutrition in the context of chronic disease given dietary recall, recent h/o weight loss, as well as mild/moderate fat and muscle depletions noted on NFPE.  Patient would benefit from nutrition supplement, RD will provide Ensure BID, patient amenable to strawberry flavor.   Medications reviewed and include: Fentanyl, Gabapentin, SS novolog, Versed, Protonix Drips: Magnesium sulfate IVPB  Labs: CBGs 114,108,148,123 x 24 hrs  NUTRITION - FOCUSED PHYSICAL EXAM: 2/11 Findings - Mild fat depletion to thoracic and lumbar region; moderate fat depletion to orbital, upper arm, and buccal region; mild muscle depletion to clavicle bone; moderate muscle depletion to temple, dorsal hand, patellar, and posterior calf regions.  Diet Order:   Diet Order            DIET SOFT Room service appropriate?  Yes; Fluid consistency: Thin  Diet effective now              EDUCATION NEEDS:   Education needs have been addressed  Skin:  Skin Assessment: Reviewed RN Assessment  Last BM:  2/10  Height:   Ht Readings  from Last 1 Encounters:  03/06/19 6' (1.829 m)    Weight:   Wt Readings from Last 1 Encounters:  03/07/19 103.7 kg    Ideal Body Weight:  80.9 kg  BMI:  Body mass index is 31 kg/m.  Estimated Nutritional Needs:   Kcal:  2300 (22 g/kg)  Protein:  104-124 (1-1.2 g/kg)  Fluid:  >/= 2.3 L/day   Lajuan Lines, RD, LDN Clinical Nutrition Office Telephone [phone] After Hours/Weekend Pager: [phone]

## 2019-03-09 LAB — GLUCOSE, CAPILLARY
Glucose-Capillary: 133 mg/dL — ABNORMAL HIGH (ref 70–99)
Glucose-Capillary: 148 mg/dL — ABNORMAL HIGH (ref 70–99)
Glucose-Capillary: 140 mg/dL — ABNORMAL HIGH (ref 70–99)
Glucose-Capillary: 133 mg/dL — ABNORMAL HIGH (ref 70–99)

## 2019-03-09 LAB — COMPREHENSIVE METABOLIC PANEL
ALT: 9 U/L (ref 0–44)
AST: 11 U/L — ABNORMAL LOW (ref 15–41)
Albumin: 2.4 g/dL — ABNORMAL LOW (ref 3.5–5.0)
Alkaline Phosphatase: 56 U/L (ref 38–126)
Anion gap: 7 (ref 5–15)
BUN: 10 mg/dL (ref 8–23)
CO2: 27 mmol/L (ref 22–32)
Calcium: 8.4 mg/dL — ABNORMAL LOW (ref 8.9–10.3)
Chloride: 103 mmol/L (ref 98–111)
Creatinine, Ser: 1.06 mg/dL (ref 0.61–1.24)
GFR calc Af Amer: >60 mL/min (ref >60)
GFR calc non Af Amer: >60 mL/min (ref >60)
Glucose, Bld: 127 mg/dL — ABNORMAL HIGH (ref 70–99)
Potassium: 3.8 mmol/L (ref 3.5–5.1)
Sodium: 137 mmol/L (ref 135–145)
Total Bilirubin: 0.7 mg/dL (ref 0.3–1.2)
Total Protein: 5.4 g/dL — ABNORMAL LOW (ref 6.5–8.1)

## 2019-03-09 LAB — CBC WITH DIFFERENTIAL/PLATELET
Abs Immature Granulocytes: 0.02 10*3/uL (ref 0.00–0.07)
Basophils Absolute: 0 10*3/uL (ref 0.0–0.1)
Basophils Relative: 0 %
Eosinophils Absolute: 0 10*3/uL (ref 0.0–0.5)
Eosinophils Relative: 1 %
HCT: 32.3 % — ABNORMAL LOW (ref 39.0–52.0)
Hemoglobin: 9.8 g/dL — ABNORMAL LOW (ref 13.0–17.0)
Immature Granulocytes: 1 %
Lymphocytes Relative: 14 %
Lymphs Abs: 0.5 10*3/uL — ABNORMAL LOW (ref 0.7–4.0)
MCH: 27.2 pg (ref 26.0–34.0)
MCHC: 30.3 g/dL (ref 30.0–36.0)
MCV: 89.7 fL (ref 80.0–100.0)
Monocytes Absolute: 0.3 10*3/uL (ref 0.1–1.0)
Monocytes Relative: 9 %
Neutro Abs: 2.4 10*3/uL (ref 1.7–7.7)
Neutrophils Relative %: 75 %
Platelets: 104 10*3/uL — ABNORMAL LOW (ref 150–400)
RBC: 3.6 MIL/uL — ABNORMAL LOW (ref 4.22–5.81)
RDW: 14 % (ref 11.5–15.5)
WBC: 3.2 10*3/uL — ABNORMAL LOW (ref 4.0–10.5)
nRBC: 0 % (ref 0.0–0.2)

## 2019-03-09 LAB — HEMOGLOBIN AND HEMATOCRIT, BLOOD
HCT: 32.8 % — ABNORMAL LOW (ref 39.0–52.0)
Hemoglobin: 10.1 g/dL — ABNORMAL LOW (ref 13.0–17.0)

## 2019-03-09 LAB — PHOSPHORUS: Phosphorus: 3.8 mg/dL (ref 2.5–4.6)

## 2019-03-09 LAB — MAGNESIUM: Magnesium: 1.7 mg/dL (ref 1.7–2.4)

## 2019-03-09 MED ORDER — VANCOMYCIN HCL IN DEXTROSE 1-5 GM/200ML-% IV SOLN
1000.0000 mg | Freq: Two times a day (BID) | INTRAVENOUS | Status: DC
  Administered 2019-03-10 – 2019-03-11 (×4): 1000 mg via INTRAVENOUS
  Filled 2019-03-09 (×4): qty 200

## 2019-03-09 MED ORDER — VANCOMYCIN HCL 2000 MG/400ML IV SOLN
2000.0000 mg | Freq: Once | INTRAVENOUS | Status: AC
  Administered 2019-03-09: 2000 mg via INTRAVENOUS
  Filled 2019-03-09: qty 400

## 2019-03-09 NOTE — Progress Notes (Signed)
PROGRESS NOTE    Steve Anthony  RAX:[id number] DOB: [birth year] DOA: 03/06/2019 PCP: [name], MD   Brief Narrative:  Steve Anthony is Steve Anthony 69 y.o. male with medical history significant of chronic pancreatitis, coronary disease, Covid infection in December 2020, pancreatic cyst, chronic back pain comes in with several days of epigastric pain and back pain with Steve Anthony lot of nausea and retching at home.  He does not drink alcohol at all.  He has not been vomiting blood.  He denies any fevers.  He denies any diarrhea.  Patient was sent to [facility] from the [facility] for work-up.  His lipase is normal patient found to have an enlarging pancreatic cyst.  GI advised medical admission for them to see in the morning.  Patient is in Steve Anthony lot of pain right now as he is not had any of his oral pain meds since 830 this morning.  He reports he is not been given any IV pain meds either.  He reports Dilaudid works well for him.  He takes oxycodone 10 mg tablets every 4 hours at home.  Patient be referred for admission for acute on chronic pancreatitis episode.  Assessment & Plan:   Principal Problem:   Pseudocyst of pancreas Active Problems:   COPD without exacerbation ([place])   Cirrhosis of liver without ascites ([place])   Recurrent pancreatitis   CKD (chronic kidney disease), stage III   History of COVID-19 01/24/19  Abdominal Pain  History of Acute Pancreatitis c/b Pancreatic Pseudocyst:  Appreciate GI c/s - now signed off, they've made arrangements for follow up and CT scan in the future S/p CT aspiration of pancreatitic pseudocyst on 2/11 with 200 ml brown opaque fluid Follow culture -> with few gram positive cocci in chains -> entercoccus faecalis -> sensitivities pending  Continue levaquin, flagyl.  With e. Faecalis, will add vancomycin until sensitivities.  Discussed briefly with ID yesterday with culture results at that point, recommending 3-4 weeks of abx therapy with repeat imaging at that  time as well.  May need to discuss case with them again once final culture results. Follow pending amylase (pending), cell count (turbid, brown fluid - unable to perform cell count), cytology (pending add on, has been discussed with lab) Lipase at presentation wnl  CT on presentation with increase in size of large pancreatic tail pseudocystic with increased surrounding soft tissue stranding and wall thickening as well as stable changes of cirrhosis of the liver Appreciate Surgery recommendations - recommend f/u with Dr. Stark Klein if it is thought that he needs surgical intervention at some point Continue home oxycodone regimen with IV pain meds for breakthrough - adjust as needed  Cirrhosis c/b Portal Hypertension Holding diuretics for now - BP soft Follows with Dr. Lyndel Safe outpatient  Atrial Fibrillation: on eliquis at home, per med rec, appears last dose was 2/9 AM.  Continue home metoprolol.  COPD: prn inhalers, no resp symptoms at this time  Depression  Anxiety: continue lexapro, trazodone, melatonin  T2DM: continue SSI.  Hold home metformin.  BG reasonable on CMP this AM.   BPH: continue flomax  GERD: continue PPI  Anemia: downtrending today, follow  Thrombocytopenia: chronic in setting of cirrhosis, follow  CKD III: creatinine improved from admission and from baseline, continue to monitor  Hypomagnesemia: replace and follow  DVT prophylaxis: SCD Code Status: full  Family Communication: none at bedside Disposition Plan:  . Patient came from: home           .  Anticipated d/c place: home . Barriers to d/c OR conditions which need to be met to effect Steve Anthony safe d/c: pending further eval by consultants   Consultants:   GI  IR  Surgery  Procedures:  none  Antimicrobials:  Anti-infectives (From admission, onward)   Start     Dose/Rate Route Frequency Ordered Stop   03/08/19 1700  metroNIDAZOLE (FLAGYL) IVPB 500 mg     500 mg 100 mL/hr over 60 Minutes Intravenous  Every 8 hours 03/08/19 1609     03/08/19 1615  levofloxacin (LEVAQUIN) IVPB 750 mg     750 mg 100 mL/hr over 90 Minutes Intravenous Daily 03/08/19 1611       Subjective: No new complaints Some soreness at aspiration site, otherwise, pain improved  Objective: Vitals:   03/08/19 1725 03/08/19 2147 03/09/19 0446 03/09/19 1415  BP: 103/64 105/68 104/75 100/66  Pulse: 81 74 71 69  Resp: 20 17 18 16   Temp: 98.2 F (36.8 C) 99.1 F (37.3 C) 98 F (36.7 C) 98.5 F (36.9 C)  TempSrc: Oral Oral Oral Oral  SpO2: 91% 91%  92%  Weight:      Height:        Intake/Output Summary (Last 24 hours) at 03/09/2019 1445 Last data filed at 03/09/2019 0900 Gross per 24 hour  Intake 810 ml  Output 725 ml  Net 85 ml   Filed Weights   03/06/19 1123 03/07/19 0500  Weight: 103.9 kg 103.7 kg    Examination:  General: No acute distress. Cardiovascular: Heart sounds show Steve Anthony regular rate, and rhythm.  Lungs: Clear to auscultation bilaterally  Abdomen: Soft, nontender, protuberant Neurological: Alert and oriented 3. Moves all extremities 4. Cranial nerves II through XII grossly intact. Skin: Warm and dry. No rashes or lesions. Extremities: No clubbing or cyanosis. No edema.  Data Reviewed: I have personally reviewed following labs and imaging studies  CBC: Recent Labs  Lab 03/06/19 1149 03/07/19 0425 03/08/19 0426 03/09/19 0431  WBC 5.1 3.3* 3.3* 3.2*  NEUTROABS 4.3  --  2.6 2.4  HGB 13.0 10.9* 11.0* 9.8*  HCT 41.2 35.0* 35.9* 32.3*  MCV 87.5 89.1 89.5 89.7  PLT 127* 93* 106* 062*   Basic Metabolic Panel: Recent Labs  Lab 03/06/19 1149 03/07/19 0425 03/08/19 0426 03/09/19 0431  NA 135 139 139 137  K 3.7 3.9 4.0 3.8  CL 100 105 104 103  CO2 25 26 27 27   GLUCOSE 132* 125* 126* 127*  BUN 19 13 10 10   CREATININE 1.26* 1.02 1.06 1.06  CALCIUM 9.0 8.3* 8.5* 8.4*  MG  --   --  1.1* 1.7  PHOS  --   --  3.6 3.8   GFR: Estimated Creatinine Clearance: 84.2 mL/min (by C-G formula  based on SCr of 1.06 mg/dL). Liver Function Tests: Recent Labs  Lab 03/06/19 1149 03/07/19 0425 03/08/19 0426 03/09/19 0431  AST 15 10* 12* 11*  ALT 14 13 12 9   ALKPHOS 84 66 61 56  BILITOT 0.5 1.0 0.8 0.7  PROT 6.6 5.9* 6.0* 5.4*  ALBUMIN 3.0* 2.7* 2.7* 2.4*   Recent Labs  Lab 03/06/19 1149  LIPASE 31   No results for input(s): AMMONIA in the last 168 hours. Coagulation Profile: Recent Labs  Lab 03/07/19 1118 03/08/19 0426  INR 1.6* 1.6*   Cardiac Enzymes: No results for input(s): CKTOTAL, CKMB, CKMBINDEX, TROPONINI in the last 168 hours. BNP (last 3 results) No results for input(s): PROBNP in the last 8760 hours. HbA1C:  No results for input(s): HGBA1C in the last 72 hours. CBG: Recent Labs  Lab 03/08/19 1206 03/08/19 1704 03/08/19 2154 03/09/19 0735 03/09/19 1152  GLUCAP 139* 163* 131* 133* 148*   Lipid Profile: No results for input(s): CHOL, HDL, LDLCALC, TRIG, CHOLHDL, LDLDIRECT in the last 72 hours. Thyroid Function Tests: No results for input(s): TSH, T4TOTAL, FREET4, T3FREE, THYROIDAB in the last 72 hours. Anemia Panel: No results for input(s): VITAMINB12, FOLATE, FERRITIN, TIBC, IRON, RETICCTPCT in the last 72 hours. Sepsis Labs: No results for input(s): PROCALCITON, LATICACIDVEN in the last 168 hours.  Recent Results (from the past 240 hour(s))  Aerobic/Anaerobic Culture (surgical/deep wound)     Status: None (Preliminary result)   Collection Time: 03/08/19 10:57 AM   Specimen: Wound; Abdominal Fluid  Result Value Ref Range Status   Specimen Description   Final    WOUND PANCREATIC PSEUDOCYST ASPIRATE Performed at [facility] [address] [place], [place] [address]    Special Requests   Final    Normal Performed at [facility], [place] [address], [place], [place] [address]    Gram Stain   Final    FEW WBC PRESENT, PREDOMINANTLY PMN FEW GRAM POSITIVE COCCI IN CHAINS Performed at [facility], [place] [address], [place], [place] [address]    Culture ABUNDANT ENTEROCOCCUS FAECALIS  Final   Report Status PENDING  Incomplete         Radiology Studies: CT ASPIRATION  Result Date: 03/08/2019 INDICATION: 68 year old male with pancreatitis and enlarging symptomatic pseudocyst with adjacent inflammatory changes. He presents for CT-guided aspiration. EXAM: CT-guided aspiration MEDICATIONS: None ANESTHESIA/SEDATION: Fentanyl 100 mcg IV; Versed 3 mg IV Moderate Sedation Time:  12 minutes The patient was continuously monitored during the procedure by the interventional radiology nurse under my direct supervision. COMPLICATIONS: None immediate. PROCEDURE: Informed written consent was obtained from the patient after Steve Anthony thorough discussion of the procedural risks, benefits and alternatives. All questions were addressed. Steve Anthony timeout was performed prior to the initiation of the procedure. Steve Anthony planning axial CT scan was performed. The complex pseudocyst was identified. Steve Anthony suitable skin entry site was selected and marked. The overlying skin was sterilely prepped and draped in the standard fashion using chlorhexidine skin prep. Local anesthesia was attained by infiltration with 1% lidocaine. Steve Anthony small dermatotomy was made. Under intermittent CT guidance, an 18 gauge trocar needle was advanced into the fluid collection. Aspiration was then performed. Approximately 200 mL thick brown fluid was aspirated. Samples were sent for Gram stain and culture. Follow-up CT imaging demonstrates significant reduction in volume of the pseudocyst. IMPRESSION: Successful aspiration of 200 mL thick brown opaque fluid from the pancreatic pseudocyst. Yasemin Rabon sample was sent for Gram stain and culture. Electronically Signed   By: Jacqulynn Cadet M.D.   On: 03/08/2019 15:44        Scheduled Meds: . escitalopram  10 mg Oral Daily  . feeding supplement (ENSURE ENLIVE)  237 mL Oral BID BM  . gabapentin  300 mg Oral BID  . insulin aspart  0-5  Units Subcutaneous QHS  . insulin aspart  0-9 Units Subcutaneous TID WC  . Melatonin  10 mg Oral QHS  . metoprolol succinate  50 mg Oral BID  . multivitamin with minerals  1 tablet Oral Daily  . pantoprazole  40 mg Oral BID  . tamsulosin  0.4 mg Oral Daily  . tiZANidine  4 mg Oral BID  . traZODone  100 mg Oral QHS  Continuous Infusions: . levofloxacin (LEVAQUIN) IV 750 mg (03/09/19 1340)  . metronidazole 500 mg (03/09/19 0942)     LOS: 3 days    Time spent: over 64 min    Fayrene Helper, MD [facility]   To contact the attending provider between 7A-7P or the covering provider during after hours 7P-7A, please log into the web site [email or web address] and access using universal [place] password for that web site. If you do not have the password, please call the hospital operator.  03/09/2019, 2:45 PM

## 2019-03-09 NOTE — Progress Notes (Signed)
[facility] Progress Note  Chief Complaint: Left upper quadrant pain  Subjective  History:  His pain is much better since the drainage of pancreatic pseudocyst.  He is feeling about the same today as yesterday, tolerating regular diet without difficulty.  No longer has nausea and vomiting.  No fever documented.  Analysis of the turbid pancreatic fluid aspirate showed gram-positive cocci.  Dr. Florene Glen tells me he spoke with infectious disease late yesterday, got advice on antibiotic type and duration.  Patient will need several weeks of antibiotics, and is being kept in hospital for IV antibiotics least till tomorrow.  ROS: Cardiovascular:  no chest pain Respiratory: no dyspnea  The patient's Past Medical, Family and Social History were reviewed and are on file in the EMR.  Objective:  Med list reviewed  Current Facility-Administered Medications:  .  acetaminophen (TYLENOL) tablet 650 mg, 650 mg, Oral, Q6H PRN, 650 mg at 03/08/19 0544 **OR** acetaminophen (TYLENOL) suppository 650 mg, 650 mg, Rectal, Q6H PRN, Derrill Kay A, MD .  albuterol (PROVENTIL) (2.5 MG/3ML) 0.083% nebulizer solution 3 mL, 3 mL, Inhalation, Q4H PRN, Elodia Florence., MD .  escitalopram (LEXAPRO) tablet 10 mg, 10 mg, Oral, Daily, Elodia Florence., MD, 10 mg at 03/09/19 0943 .  feeding supplement (ENSURE ENLIVE) (ENSURE ENLIVE) liquid 237 mL, 237 mL, Oral, BID BM, Elodia Florence., MD .  gabapentin (NEURONTIN) capsule 300 mg, 300 mg, Oral, BID, Elodia Florence., MD, 300 mg at 03/09/19 0943 .  HYDROmorphone (DILAUDID) injection 1 mg, 1 mg, Intravenous, Q3H PRN, Elodia Florence., MD, 1 mg at 03/09/19 0452 .  insulin aspart (novoLOG) injection 0-5 Units, 0-5 Units, Subcutaneous, QHS, Powell, A Clint Lipps., MD .  insulin aspart (novoLOG) injection 0-9 Units, 0-9 Units, Subcutaneous, TID WC, Elodia Florence., MD, 1 Units at 03/09/19 0825 .  levofloxacin (LEVAQUIN) IVPB 750  mg, 750 mg, Intravenous, Q1400, Polly Cobia, RPH, Last Rate: 100 mL/hr at 03/08/19 1809, Rate Verify at 03/08/19 1809 .  Melatonin TABS 10 mg, 10 mg, Oral, QHS, Elodia Florence., MD, 10 mg at 03/08/19 2122 .  metoprolol succinate (TOPROL-XL) 24 hr tablet 50 mg, 50 mg, Oral, BID, Elodia Florence., MD, 50 mg at 03/09/19 0943 .  metroNIDAZOLE (FLAGYL) IVPB 500 mg, 500 mg, Intravenous, Q8H, Elodia Florence., MD, Last Rate: 100 mL/hr at 03/09/19 0942, 500 mg at 03/09/19 0942 .  multivitamin with minerals tablet 1 tablet, 1 tablet, Oral, Daily, Elodia Florence., MD, 1 tablet at 03/09/19 [phone] .  ondansetron (ZOFRAN) tablet 4 mg, 4 mg, Oral, Q6H PRN **OR** ondansetron (ZOFRAN) injection 4 mg, 4 mg, Intravenous, Q6H PRN, Phillips Grout, MD, 4 mg at 03/07/19 1025 .  oxyCODONE (Oxy IR/ROXICODONE) immediate release tablet 10 mg, 10 mg, Oral, Q4H PRN, Phillips Grout, MD, 10 mg at 03/09/19 0943 .  pantoprazole (PROTONIX) EC tablet 40 mg, 40 mg, Oral, BID, Elodia Florence., MD, 40 mg at 03/09/19 0943 .  tamsulosin (FLOMAX) capsule 0.4 mg, 0.4 mg, Oral, Daily, Elodia Florence., MD, 0.4 mg at 03/09/19 0943 .  tiZANidine (ZANAFLEX) tablet 4 mg, 4 mg, Oral, BID, Elodia Florence., MD, 4 mg at 03/09/19 0943 .  traZODone (DESYREL) tablet 100 mg, 100 mg, Oral, QHS, Elodia Florence., MD, 100 mg at 03/08/19 2122   Vital signs in last 24 hrs: Vitals:   03/08/19 2147 03/09/19  0446  BP: 105/68 104/75  Pulse: 74 71  Resp: 17 18  Temp: 99.1 F (37.3 C) 98 F (36.7 C)  SpO2: 91%     Physical Exam  Looks well, sitting up in chair, just had breakfast.  HEENT: sclera anicteric, oral mucosa moist without lesions  Neck: supple, no thyromegaly, JVD or lymphadenopathy  Cardiac: RRR without murmurs, S1S2 heard, no peripheral edema  Pulm: clear to auscultation bilaterally, normal RR and effort noted  Abdomen: soft, mild LUQ tenderness, with active bowel sounds.   Spleen tip palpable, he no longer has the fullness from the cyst.  Skin; warm and dry, no jaundice or rash  Labs:  Gram stain on fluid shows gram-positive cocci, culture and sensitivity pending  CBC Latest Ref Rng & Units 03/09/2019 03/08/2019 03/07/2019  WBC 4.0 - 10.5 K/uL 3.2(L) 3.3(L) 3.3(L)  Hemoglobin 13.0 - 17.0 g/dL 9.8(L) 11.0(L) 10.9(L)  Hematocrit 39.0 - 52.0 % 32.3(L) 35.9(L) 35.0(L)  Platelets 150 - 400 K/uL 104(L) 106(L) 93(L)   CMP Latest Ref Rng & Units 03/09/2019 03/08/2019 03/07/2019  Glucose 70 - 99 mg/dL 127(H) 126(H) 125(H)  BUN 8 - 23 mg/dL 10 10 13   Creatinine 0.61 - 1.24 mg/dL 1.06 1.06 1.02  Sodium 135 - 145 mmol/L 137 139 139  Potassium 3.5 - 5.1 mmol/L 3.8 4.0 3.9  Chloride 98 - 111 mmol/L 103 104 105  CO2 22 - 32 mmol/L 27 27 26   Calcium 8.9 - 10.3 mg/dL 8.4(L) 8.5(L) 8.3(L)  Total Protein 6.5 - 8.1 g/dL 5.4(L) 6.0(L) 5.9(L)  Total Bilirubin 0.3 - 1.2 mg/dL 0.7 0.8 1.0  Alkaline Phos 38 - 126 U/L 56 61 66  AST 15 - 41 U/L 11(L) 12(L) 10(L)  ALT 0 - 44 U/L 9 12 13      ___________________________________________ Radiologic studies:   ____________________________________________ Other:   _____________________________________________ @ASSESSMENTPLANBEGIN @ Assessment: @DX @ Left upper quadrant pain Pancreatic pseudocyst with infected fluid (gram-positive cocci) -currently on metronidazole and levofloxacin. Underlying cirrhosis with portal hypertension and upper abdominal varices  Plan: Patient will receive IV antibiotics until it is felt appropriate to convert that oral antibiotics at the time of discharge. He will need close follow-up with Dr. Lyndel Safe at our practice.  I will send him a chart message to make those arrangements, including follow-up CT scan in the near future and a clinic visit. I will sign off, but I am available over the weekend if needed for questions.   25 minutes were spent on this encounter (including chart review, history/exam,  counseling/coordination of care, and documentation)  Nelida Meuse III

## 2019-03-09 NOTE — Progress Notes (Signed)
Pharmacy Antibiotic Note  Steve Anthony is a 69 y.o. male admitted on 03/06/2019 with infected pancreatic pseudocycst.  Pharmacy has been consulted for vancomycin dosing.  Pt currently on levofloxacin IV 748m daily and metronidazole 5056mevery 8 hours. Abdominal fluid cultures still pending, but growing enterococcus faecalis. Awaiting susceptibilities.  Plan: IV vancomycin 2g x 1 dose. IV vancomycin 100029m12h starting at 0600 on 2/13. Follow up wound cx, susceptibilities, and renal function.  Height: 6' (182.9 cm) Weight: 228 lb 9.6 oz (103.7 kg) IBW/kg (Calculated) : 77.6  Temp (24hrs), Avg:98.5 F (36.9 C), Min:98 F (36.7 C), Max:99.1 F (37.3 C)  Recent Labs  Lab 03/06/19 1149 03/07/19 0425 03/08/19 0426 03/09/19 0431  WBC 5.1 3.3* 3.3* 3.2*  CREATININE 1.26* 1.02 1.06 1.06    Estimated Creatinine Clearance: 84.2 mL/min (by C-G formula based on SCr of 1.06 mg/dL).    Allergies  Allergen Reactions  . Penicillins Hives and Shortness Of Breath    Did it involve swelling of the face/tongue/throat, SOB, or low BP? Yes Did it involve sudden or severe rash/hives, skin peeling, or any reaction on the inside of your mouth or nose? No Did you need to seek medical attention at a hospital or doctor's office? Yes (emergency room) When did it last happen?1974 If all above answers are "NO", may proceed with cephalosporin use.      Antimicrobials this admission: 2/11 metronidazole >>  2/11 levofloxacin >>   Microbiology results: 2/11 BCx: GPC in chains-enterococcus faecalis  Thank you for allowing pharmacy to be a part of this patient's care.  GloOnnie Boer12/2021 2:54 PM

## 2019-03-09 NOTE — Progress Notes (Signed)
CC: Abdominal pain with pancreatic pseudocyst, nausea and weight loss  Subjective: Patient is doing well this a.m. having his second regular meal.  No pain or discomfort abdomen or back.  He is tolerating p.o.'s well.  Objective: Vital signs in last 24 hours: Temp:  [98 F (36.7 C)-99.1 F (37.3 C)] 98 F (36.7 C) (02/12 0446) Pulse Rate:  [70-81] 71 (02/12 0446) Resp:  [15-20] 18 (02/12 0446) BP: (94-105)/(64-76) 104/75 (02/12 0446) SpO2:  [91 %-98 %] 91 % (02/11 2147) Last BM Date: 03/07/19 450 IV recorded Urine 975 IR drainage of pseudocyst -200 mL T-max 99.1, vital signs are stable WBC 3.2, H/H 9.8/32.3 CMP is stable except for glucose 127, calcium 8.4, albumin 2.4, AST 11 ALT 9 total protein 5.4 total bilirubin 0.7 Intake/Output from previous day: 02/11 0701 - 02/12 0700 In: 450 [IV Piggyback:450] Out: 1175 [Urine:975; Drains:200] Intake/Output this shift: No intake/output data recorded.  General appearance: alert, cooperative and no distress GI: soft, non-tender; bowel sounds normal; no masses,  no organomegaly and Dressing over drain site.  Midline old incision with ventral hernia easily reducible.  Lab Results:  Recent Labs    03/08/19 0426 03/09/19 0431  WBC 3.3* 3.2*  HGB 11.0* 9.8*  HCT 35.9* 32.3*  PLT 106* 104*    BMET Recent Labs    03/08/19 0426 03/09/19 0431  NA 139 137  K 4.0 3.8  CL 104 103  CO2 27 27  GLUCOSE 126* 127*  BUN 10 10  CREATININE 1.06 1.06  CALCIUM 8.5* 8.4*   PT/INR Recent Labs    03/07/19 1118 03/08/19 0426  LABPROT 19.4* 19.3*  INR 1.6* 1.6*    Recent Labs  Lab 03/06/19 1149 03/07/19 0425 03/08/19 0426 03/09/19 0431  AST 15 10* 12* 11*  ALT 14 13 12 9   ALKPHOS 84 66 61 56  BILITOT 0.5 1.0 0.8 0.7  PROT 6.6 5.9* 6.0* 5.4*  ALBUMIN 3.0* 2.7* 2.7* 2.4*     Lipase     Component Value Date/Time   LIPASE 31 03/06/2019 1149     Medications: . escitalopram  10 mg Oral Daily  . feeding supplement  (ENSURE ENLIVE)  237 mL Oral BID BM  . gabapentin  300 mg Oral BID  . insulin aspart  0-5 Units Subcutaneous QHS  . insulin aspart  0-9 Units Subcutaneous TID WC  . Melatonin  10 mg Oral QHS  . metoprolol succinate  50 mg Oral BID  . multivitamin with minerals  1 tablet Oral Daily  . pantoprazole  40 mg Oral BID  . tamsulosin  0.4 mg Oral Daily  . tiZANidine  4 mg Oral BID  . traZODone  100 mg Oral QHS   . levofloxacin (LEVAQUIN) IV 100 mL/hr at 03/08/19 1809  . metronidazole 100 mL/hr at 03/09/19 0123    Assessment/Plan CAD s/p stent 2006 Atrial fibrillation -on Eliquis DJD/chronic lower back pain COPD/sleep apnea Hypertension Type 2 diabetes Stage III CKD Hyperlipidemia Hx colon resection 2015/diverticulitis  Enlarging/painful pancreatic pseudocyst Recurrent idiopathic pancreatitis Hx alcohol use/cryptogenic cirrhosis/ascites Esophageal varices/portal hypertension  FEN: Soft diet ID: Levofloxacin 2/11 >> day 2;  Flagyl 2/11 >> day 2 DVT: SCDs Follow-up: TBD   Plan: Patient doing well after IR drainage of pseudocyst.  No current surgical indication.  Notes suggest he may go home today.  Follow-up with Dr. Lyndel Safe.  If at some point it is DR. Gupta's opinion the patient requires surgical intervention; please refer to Dr. Stark Klein.  LOS: 3 days    Sakeena Teall 03/09/2019 Please see Amion

## 2019-03-09 NOTE — Care Management Important Message (Signed)
Important Message  Patient Details IM Letter given to Marney Doctor RN Case Manager to present to the Patient Name: Steve Anthony MRN: [id number] Date of Birth: 01-Apr-1950   Medicare Important Message Given:  Yes     Kerin Salen 03/09/2019, 10:38 AM

## 2019-03-10 ENCOUNTER — Inpatient Hospital Stay: Payer: Self-pay

## 2019-03-10 LAB — GLUCOSE, CAPILLARY
Glucose-Capillary: 148 mg/dL — ABNORMAL HIGH (ref 70–99)
Glucose-Capillary: 135 mg/dL — ABNORMAL HIGH (ref 70–99)
Glucose-Capillary: 100 mg/dL — ABNORMAL HIGH (ref 70–99)
Glucose-Capillary: 148 mg/dL — ABNORMAL HIGH (ref 70–99)

## 2019-03-10 LAB — COMPREHENSIVE METABOLIC PANEL WITH GFR
ALT: 9 U/L (ref 0–44)
AST: 10 U/L — ABNORMAL LOW (ref 15–41)
Albumin: 2.5 g/dL — ABNORMAL LOW (ref 3.5–5.0)
Alkaline Phosphatase: 51 U/L (ref 38–126)
Anion gap: 8 (ref 5–15)
BUN: 8 mg/dL (ref 8–23)
CO2: 27 mmol/L (ref 22–32)
Calcium: 8.4 mg/dL — ABNORMAL LOW (ref 8.9–10.3)
Chloride: 101 mmol/L (ref 98–111)
Creatinine, Ser: 1.11 mg/dL (ref 0.61–1.24)
GFR calc Af Amer: >60 mL/min
GFR calc non Af Amer: >60 mL/min
Glucose, Bld: 168 mg/dL — ABNORMAL HIGH (ref 70–99)
Potassium: 3.8 mmol/L (ref 3.5–5.1)
Sodium: 136 mmol/L (ref 135–145)
Total Bilirubin: 0.6 mg/dL (ref 0.3–1.2)
Total Protein: 5.3 g/dL — ABNORMAL LOW (ref 6.5–8.1)

## 2019-03-10 LAB — CBC WITH DIFFERENTIAL/PLATELET
Abs Immature Granulocytes: 0.03 K/uL (ref 0.00–0.07)
Basophils Absolute: 0 K/uL (ref 0.0–0.1)
Basophils Relative: 0 %
Eosinophils Absolute: 0 K/uL (ref 0.0–0.5)
Eosinophils Relative: 1 %
HCT: 33.1 % — ABNORMAL LOW (ref 39.0–52.0)
Hemoglobin: 10.2 g/dL — ABNORMAL LOW (ref 13.0–17.0)
Immature Granulocytes: 1 %
Lymphocytes Relative: 15 %
Lymphs Abs: 0.4 K/uL — ABNORMAL LOW (ref 0.7–4.0)
MCH: 27.4 pg (ref 26.0–34.0)
MCHC: 30.8 g/dL (ref 30.0–36.0)
MCV: 89 fL (ref 80.0–100.0)
Monocytes Absolute: 0.2 K/uL (ref 0.1–1.0)
Monocytes Relative: 8 %
Neutro Abs: 2.3 K/uL (ref 1.7–7.7)
Neutrophils Relative %: 75 %
Platelets: 91 K/uL — ABNORMAL LOW (ref 150–400)
RBC: 3.72 MIL/uL — ABNORMAL LOW (ref 4.22–5.81)
RDW: 13.9 % (ref 11.5–15.5)
WBC: 3 K/uL — ABNORMAL LOW (ref 4.0–10.5)
nRBC: 0 % (ref 0.0–0.2)

## 2019-03-10 LAB — PHOSPHORUS: Phosphorus: 3.2 mg/dL (ref 2.5–4.6)

## 2019-03-10 LAB — MAGNESIUM: Magnesium: 1.2 mg/dL — ABNORMAL LOW (ref 1.7–2.4)

## 2019-03-10 MED ORDER — DIPHENHYDRAMINE HCL 25 MG PO CAPS
25.0000 mg | ORAL_CAPSULE | Freq: Four times a day (QID) | ORAL | Status: DC | PRN
  Administered 2019-03-10: 25 mg via ORAL
  Filled 2019-03-10: qty 1

## 2019-03-10 MED ORDER — MAGNESIUM SULFATE 4 GM/100ML IV SOLN
4.0000 g | Freq: Once | INTRAVENOUS | Status: AC
  Administered 2019-03-10: 4 g via INTRAVENOUS
  Filled 2019-03-10: qty 100

## 2019-03-10 MED ORDER — MAGNESIUM SULFATE 2 GM/50ML IV SOLN
2.0000 g | Freq: Once | INTRAVENOUS | Status: AC
  Administered 2019-03-10: 2 g via INTRAVENOUS
  Filled 2019-03-10: qty 50

## 2019-03-10 NOTE — Progress Notes (Signed)
Secure chat with Dr Florene Glen states he spoke with nephrology and it is ok to place PICC in dominant arm.  Notified plan to place PICC 03/11/19.

## 2019-03-10 NOTE — Progress Notes (Addendum)
PROGRESS NOTE    Steve Anthony  GYI:[id number] DOB: 10/14/1950 DOA: 03/06/2019 PCP: [name], MD   Brief Narrative:  Steve Anthony is Steve Anthony 69 y.o. male with medical history significant of chronic pancreatitis, coronary disease, Covid infection in December 2020, pancreatic cyst, chronic back pain comes in with several days of epigastric pain and back pain with Steve Anthony lot of nausea and retching at home.  He does not drink alcohol at all.  He has not been vomiting blood.  He denies any fevers.  He denies any diarrhea.  Patient was sent to [facility] from the [place] for work-up.  His lipase is normal patient found to have an enlarging pancreatic cyst.  GI advised medical admission for them to see in the morning.  Patient is in Steve Anthony lot of pain right now as he is not had any of his oral pain meds since 830 this morning.  He reports he is not been given any IV pain meds either.  He reports Dilaudid works well for him.  He takes oxycodone 10 mg tablets every 4 hours at home.  Patient be referred for admission for acute on chronic pancreatitis episode.  Assessment & Plan:   Principal Problem:   Pseudocyst of pancreas Active Problems:   COPD without exacerbation (Steve Anthony)   Cirrhosis of liver without ascites (Steve Anthony)   Recurrent pancreatitis   CKD (chronic kidney disease), stage III   History of COVID-19 01/24/19  Abdominal Pain  History of Acute Pancreatitis c/b Pancreatic Pseudocyst:  Appreciate GI c/s - now signed off, they've made arrangements for follow up and CT scan in the future S/p CT aspiration of pancreatitic pseudocyst on 2/11 with 200 ml brown opaque fluid Follow culture -> with few gram positive cocci in chains -> entercoccus faecalis -> sensitivities pending  Continue levaquin, flagyl.  With e. Faecalis, will add vancomycin until sensitivities.  Discussed briefly with ID, recommending 3-4 weeks of abx therapy with repeat imaging at that time as well.  May need to discuss case with  them again once final culture results. Follow pending amylase (pending), cell count (turbid, brown fluid - unable to perform cell count), cytology (pending add on, has been discussed with lab) Lipase at presentation wnl  CT on presentation with increase in size of large pancreatic tail pseudocystic with increased surrounding soft tissue stranding and wall thickening as well as stable changes of cirrhosis of the liver Appreciate Surgery recommendations - recommend f/u with Dr. Stark Klein if it is thought that he needs surgical intervention at some point Continue home oxycodone regimen with IV pain meds for breakthrough - adjust as needed  Cirrhosis c/b Portal Hypertension Holding diuretics for now - BP soft Follows with Dr. Lyndel Safe outpatient  Atrial Fibrillation: on eliquis at home, per med rec, appears last dose was 2/9 AM.  Continue home metoprolol.  COPD: prn inhalers, no resp symptoms at this time  Depression  Anxiety: continue lexapro, trazodone, melatonin  T2DM: continue SSI.  Hold home metformin.  BG reasonable on CMP this AM.   BPH: continue flomax  GERD: continue PPI  Anemia: downtrending today, follow  Thrombocytopenia: chronic in setting of cirrhosis, follow  CKD III: creatinine improved from admission and from baseline, continue to monitor  Hypomagnesemia: replace and follow - will likely need PO supplementation at discharge  DVT prophylaxis: SCD Code Status: full  Family Communication: none at bedside Disposition Plan:  . Patient came from: home           .  Anticipated d/c place: home . Barriers to d/c OR conditions which need to be met to effect Steve Anthony safe d/c: pending further eval by consultants -> final culture results and decision on abx   Consultants:   GI  IR  Surgery  Procedures:  none  Antimicrobials:  Anti-infectives (From admission, onward)   Start     Dose/Rate Route Frequency Ordered Stop   03/10/19 0600  vancomycin (VANCOCIN) IVPB 1000  mg/200 mL premix     1,000 mg 200 mL/hr over 60 Minutes Intravenous Every 12 hours 03/09/19 1513     03/09/19 1630  vancomycin (VANCOREADY) IVPB 2000 mg/400 mL     2,000 mg 200 mL/hr over 120 Minutes Intravenous  Once 03/09/19 1513 03/09/19 2155   03/08/19 1700  metroNIDAZOLE (FLAGYL) IVPB 500 mg     500 mg 100 mL/hr over 60 Minutes Intravenous Every 8 hours 03/08/19 1609     03/08/19 1615  levofloxacin (LEVAQUIN) IVPB 750 mg     750 mg 100 mL/hr over 90 Minutes Intravenous Daily 03/08/19 1611       Subjective: Asking when he may be discharged (discussed awaiting final cx results to make decision on abx)  Objective: Vitals:   03/09/19 1415 03/09/19 2103 03/10/19 0553 03/10/19 1401  BP: 100/66 99/60 132/64 107/69  Pulse: 69 80 77 69  Resp: 16 18 18 16   Temp: 98.5 F (36.9 C) 99.3 F (37.4 C) 98.6 F (37 C) 98.3 F (36.8 C)  TempSrc: Oral Oral Oral Oral  SpO2: 92% 92% 92% 93%  Weight:      Height:        Intake/Output Summary (Last 24 hours) at 03/10/2019 1446 Last data filed at 03/10/2019 0553 Gross per 24 hour  Intake 763.23 ml  Output 800 ml  Net -36.77 ml   Filed Weights   03/06/19 1123 03/07/19 0500  Weight: 103.9 kg 103.7 kg    Examination:  General: No acute distress. Cardiovascular: Heart sounds show Steve Anthony regular rate, and rhythm. Lungs: Clear to auscultation bilaterally Abdomen: Soft, nontender, nondistended  Neurological: Alert and oriented 3. Moves all extremities 4. Cranial nerves II through XII grossly intact. Skin: Warm and dry. No rashes or lesions. Extremities: No clubbing or cyanosis. No edema.   Data Reviewed: I have personally reviewed following labs and imaging studies  CBC: Recent Labs  Lab 03/06/19 1149 03/06/19 1149 03/07/19 0425 03/08/19 0426 03/09/19 0431 03/09/19 1505 03/10/19 0455  WBC 5.1  --  3.3* 3.3* 3.2*  --  3.0*  NEUTROABS 4.3  --   --  2.6 2.4  --  2.3  HGB 13.0   < > 10.9* 11.0* 9.8* 10.1* 10.2*  HCT 41.2   < >  35.0* 35.9* 32.3* 32.8* 33.1*  MCV 87.5  --  89.1 89.5 89.7  --  89.0  PLT 127*  --  93* 106* 104*  --  91*   < > = values in this interval not displayed.   Basic Metabolic Panel: Recent Labs  Lab 03/06/19 1149 03/07/19 0425 03/08/19 0426 03/09/19 0431 03/10/19 0455  NA 135 139 139 137 136  K 3.7 3.9 4.0 3.8 3.8  CL 100 105 104 103 101  CO2 25 26 27 27 27   GLUCOSE 132* 125* 126* 127* 168*  BUN 19 13 10 10 8   CREATININE 1.26* 1.02 1.06 1.06 1.11  CALCIUM 9.0 8.3* 8.5* 8.4* 8.4*  MG  --   --  1.1* 1.7 1.2*  PHOS  --   --  3.6 3.8 3.2   GFR: Estimated Creatinine Clearance: 80.4 mL/min (by C-G formula based on SCr of 1.11 mg/dL). Liver Function Tests: Recent Labs  Lab 03/06/19 1149 03/07/19 0425 03/08/19 0426 03/09/19 0431 03/10/19 0455  AST 15 10* 12* 11* 10*  ALT 14 13 12 9 9   ALKPHOS 84 66 61 56 51  BILITOT 0.5 1.0 0.8 0.7 0.6  PROT 6.6 5.9* 6.0* 5.4* 5.3*  ALBUMIN 3.0* 2.7* 2.7* 2.4* 2.5*   Recent Labs  Lab 03/06/19 1149  LIPASE 31   No results for input(s): AMMONIA in the last 168 hours. Coagulation Profile: Recent Labs  Lab 03/07/19 1118 03/08/19 0426  INR 1.6* 1.6*   Cardiac Enzymes: No results for input(s): CKTOTAL, CKMB, CKMBINDEX, TROPONINI in the last 168 hours. BNP (last 3 results) No results for input(s): PROBNP in the last 8760 hours. HbA1C: No results for input(s): HGBA1C in the last 72 hours. CBG: Recent Labs  Lab 03/09/19 1152 03/09/19 1636 03/09/19 2003 03/10/19 0755 03/10/19 1155  GLUCAP 148* 140* 133* 148* 135*   Lipid Profile: No results for input(s): CHOL, HDL, LDLCALC, TRIG, CHOLHDL, LDLDIRECT in the last 72 hours. Thyroid Function Tests: No results for input(s): TSH, T4TOTAL, FREET4, T3FREE, THYROIDAB in the last 72 hours. Anemia Panel: No results for input(s): VITAMINB12, FOLATE, FERRITIN, TIBC, IRON, RETICCTPCT in the last 72 hours. Sepsis Labs: No results for input(s): PROCALCITON, LATICACIDVEN in the last 168  hours.  Recent Results (from the past 240 hour(s))  Aerobic/Anaerobic Culture (surgical/deep wound)     Status: None (Preliminary result)   Collection Time: 03/08/19 10:57 AM   Specimen: Wound; Abdominal Fluid  Result Value Ref Range Status   Specimen Description   Final    WOUND PANCREATIC PSEUDOCYST ASPIRATE Performed at [facility] [address], [place], [place] [address]    Special Requests   Final    Normal Performed at [facility], [place] [address] [place], [place] [address]    Gram Stain   Final    FEW WBC PRESENT, PREDOMINANTLY PMN FEW GRAM POSITIVE COCCI IN CHAINS Performed at [facility], [place] [address] [place], [place] [address]    Culture ABUNDANT ENTEROCOCCUS FAECALIS  Final   Report Status PENDING  Incomplete         Radiology Studies: No results found.      Scheduled Meds: . escitalopram  10 mg Oral Daily  . feeding supplement (ENSURE ENLIVE)  237 mL Oral BID BM  . gabapentin  300 mg Oral BID  . insulin aspart  0-5 Units Subcutaneous QHS  . insulin aspart  0-9 Units Subcutaneous TID WC  . Melatonin  10 mg Oral QHS  . metoprolol succinate  50 mg Oral BID  . multivitamin with minerals  1 tablet Oral Daily  . pantoprazole  40 mg Oral BID  . tamsulosin  0.4 mg Oral Daily  . tiZANidine  4 mg Oral BID  . traZODone  100 mg Oral QHS   Continuous Infusions: . levofloxacin (LEVAQUIN) IV 750 mg (03/10/19 1346)  . metronidazole 500 mg (03/10/19 0841)  . vancomycin 1,000 mg (03/10/19 0414)     LOS: 4 days    Time spent: over 30 min    Fayrene Helper, MD [facility]   To contact the attending provider between 7A-7P or the covering provider during after hours 7P-7A, please log into the web site [email or web address] and access using universal [place] password for that web site. If  you do not have the password, please call the hospital operator.  03/10/2019, 2:46 PM

## 2019-03-11 LAB — CBC WITH DIFFERENTIAL/PLATELET
Abs Immature Granulocytes: 0.03 10*3/uL (ref 0.00–0.07)
Basophils Absolute: 0 10*3/uL (ref 0.0–0.1)
Basophils Relative: 0 %
Eosinophils Absolute: 0.1 10*3/uL (ref 0.0–0.5)
Eosinophils Relative: 2 %
HCT: 33.2 % — ABNORMAL LOW (ref 39.0–52.0)
Hemoglobin: 10.4 g/dL — ABNORMAL LOW (ref 13.0–17.0)
Immature Granulocytes: 1 %
Lymphocytes Relative: 13 %
Lymphs Abs: 0.4 10*3/uL — ABNORMAL LOW (ref 0.7–4.0)
MCH: 27.4 pg (ref 26.0–34.0)
MCHC: 31.3 g/dL (ref 30.0–36.0)
MCV: 87.4 fL (ref 80.0–100.0)
Monocytes Absolute: 0.2 10*3/uL (ref 0.1–1.0)
Monocytes Relative: 7 %
Neutro Abs: 2.5 10*3/uL (ref 1.7–7.7)
Neutrophils Relative %: 77 %
Platelets: 112 10*3/uL — ABNORMAL LOW (ref 150–400)
RBC: 3.8 MIL/uL — ABNORMAL LOW (ref 4.22–5.81)
RDW: 14 % (ref 11.5–15.5)
WBC: 3.2 10*3/uL — ABNORMAL LOW (ref 4.0–10.5)
nRBC: 0 % (ref 0.0–0.2)

## 2019-03-11 LAB — COMPREHENSIVE METABOLIC PANEL
ALT: 8 U/L (ref 0–44)
AST: 11 U/L — ABNORMAL LOW (ref 15–41)
Albumin: 2.6 g/dL — ABNORMAL LOW (ref 3.5–5.0)
Alkaline Phosphatase: 49 U/L (ref 38–126)
Anion gap: 10 (ref 5–15)
BUN: 6 mg/dL — ABNORMAL LOW (ref 8–23)
CO2: 26 mmol/L (ref 22–32)
Calcium: 8.5 mg/dL — ABNORMAL LOW (ref 8.9–10.3)
Chloride: 103 mmol/L (ref 98–111)
Creatinine, Ser: 1.05 mg/dL (ref 0.61–1.24)
GFR calc Af Amer: >60 mL/min (ref >60)
GFR calc non Af Amer: >60 mL/min (ref >60)
Glucose, Bld: 164 mg/dL — ABNORMAL HIGH (ref 70–99)
Potassium: 4 mmol/L (ref 3.5–5.1)
Sodium: 139 mmol/L (ref 135–145)
Total Bilirubin: 0.4 mg/dL (ref 0.3–1.2)
Total Protein: 5.6 g/dL — ABNORMAL LOW (ref 6.5–8.1)

## 2019-03-11 LAB — GLUCOSE, CAPILLARY
Glucose-Capillary: 144 mg/dL — ABNORMAL HIGH (ref 70–99)
Glucose-Capillary: 218 mg/dL — ABNORMAL HIGH (ref 70–99)

## 2019-03-11 LAB — PHOSPHORUS: Phosphorus: 3.2 mg/dL (ref 2.5–4.6)

## 2019-03-11 LAB — AMYLASE, BODY FLUID (OTHER): Amylase, Body Fluid: 5203 U/L

## 2019-03-11 LAB — MAGNESIUM: Magnesium: 2 mg/dL (ref 1.7–2.4)

## 2019-03-11 MED ORDER — VANCOMYCIN IV (FOR PTA / DISCHARGE USE ONLY): 1000.0000 mg | Freq: Two times a day (BID) | INTRAVENOUS | 0 refills | Status: AC

## 2019-03-11 MED ORDER — SODIUM CHLORIDE 0.9% FLUSH: 10.0000 mL | INTRAVENOUS | Status: DC | PRN

## 2019-03-11 MED ORDER — HEPARIN SOD (PORK) LOCK FLUSH 100 UNIT/ML IV SOLN
500.0000 [IU] | Freq: Once | INTRAVENOUS | Status: AC
  Administered 2019-03-11: 500 [IU] via INTRAVENOUS
  Filled 2019-03-11: qty 5

## 2019-03-11 MED ORDER — APIXABAN 5 MG PO TABS: 5.0000 mg | ORAL_TABLET | Freq: Two times a day (BID) | ORAL | Status: DC

## 2019-03-11 MED ORDER — METRONIDAZOLE 500 MG PO TABS: 500.0000 mg | ORAL_TABLET | Freq: Three times a day (TID) | ORAL | 0 refills | Status: AC

## 2019-03-11 MED ORDER — METRONIDAZOLE 500 MG PO TABS
500.0000 mg | ORAL_TABLET | Freq: Three times a day (TID) | ORAL | Status: DC
  Administered 2019-03-11: 500 mg via ORAL
  Filled 2019-03-11: qty 1

## 2019-03-11 MED ORDER — CHLORHEXIDINE GLUCONATE CLOTH 2 % EX PADS
6.0000 | MEDICATED_PAD | Freq: Every day | CUTANEOUS | Status: DC
  Administered 2019-03-11: 6 via TOPICAL

## 2019-03-11 MED ORDER — LEVOFLOXACIN 750 MG PO TABS: 750.0000 mg | ORAL_TABLET | Freq: Every day | ORAL | 0 refills | Status: AC

## 2019-03-11 MED ORDER — SODIUM CHLORIDE 0.9% FLUSH
10.0000 mL | Freq: Two times a day (BID) | INTRAVENOUS | Status: DC
  Administered 2019-03-11: 10 mL

## 2019-03-11 NOTE — Progress Notes (Signed)
Patient discharged to home with family, discharge instructions reviewed with patient who verbalized understanding. New RX's sent electronically to pharmacy.

## 2019-03-11 NOTE — Progress Notes (Signed)
Peripherally Inserted Central Catheter/Midline Placement  The IV Nurse has discussed with the patient and/or persons authorized to consent for the patient, the purpose of this procedure and the potential benefits and risks involved with this procedure.  The benefits include less needle sticks, lab draws from the catheter, and the patient may be discharged home with the catheter. Risks include, but not limited to, infection, bleeding, blood clot (thrombus formation), and puncture of an artery; nerve damage and irregular heartbeat and possibility to perform a PICC exchange if needed/ordered by physician.  Alternatives to this procedure were also discussed.  B[facility] [employer] PICC patient education guide, fact sheet on infection prevention and patient information card has been provided to patient /or left at bedside.    PICC/Midline Placement Documentation  PICC Single Lumen 03/11/19 PICC Right Cephalic 40 cm 0 cm (Active)  Indication for Insertion or Continuance of Line Home intravenous therapies (PICC only) 03/11/19 0930  Exposed Catheter (cm) 0 cm 03/11/19 0930  Site Assessment Clean;Dry;Intact 03/11/19 0930  Line Status Flushed;Saline locked;Blood return noted 03/11/19 0930  Dressing Type Transparent 03/11/19 0930  Dressing Status Clean;Dry;Intact;Antimicrobial disc in place 03/11/19 [place] checked and tightened 03/11/19 0930  Line Adjustment (NICU/IV Team Only) No 03/11/19 0930  Dressing Intervention New dressing 03/11/19 0930  Dressing Change Due 03/18/19 03/11/19 0930       Rolena Infante 03/11/2019, 9:31 AM

## 2019-03-11 NOTE — TOC Progression Note (Signed)
Transition of Care [facility]) - Progression Note    Patient Details  Name: Steve Anthony MRN: [id number] Date of Birth: [birth year]  Transition of Care [facility] CM/SW Contact  Joaquin Courts, RN Phone Number: 03/11/2019, 2:51 PM  Clinical Narrative:    Referral for home IV abx given to pam chandler with [facility].  [place] home health will provide [facility] services. Patient to receive evening dose of IV abx in hospital and dc to home after.     Expected Discharge Plan: [place] Barriers to Discharge: No Barriers Identified  Expected Discharge Plan and Services Expected Discharge Plan: [place]   Discharge Planning Services: CM Consult Post Acute Care Choice: [place] arrangements for the past 2 months: Single Family Home                 DME Arranged: N/A DME Agency: NA       HH Arranged: RN, IV Antibiotics HH Agency: [place] Date [place]: 03/11/19 Time HH Agency Contacted: [id number]     Social Determinants of Health (SDOH) Interventions    Readmission Risk Interventions No flowsheet data found.

## 2019-03-11 NOTE — Progress Notes (Signed)
PHARMACY CONSULT NOTE FOR:  OUTPATIENT  PARENTERAL ANTIBIOTIC THERAPY (OPAT)  Indication: infected pancreatic pseudocyst Regimen: Vancomycin 1 gm IV q12 End date: 03/30/2019  IV antibiotic discharge orders are pended. To discharging provider:  please sign these orders via discharge navigator,  Select New Orders & click on the button choice - Manage This Unsigned Work.     Thank you for allowing pharmacy to be a part of this patient's care. Eudelia Bunch, Pharm.D [phone] 03/11/2019 11:45 AM

## 2019-03-11 NOTE — Discharge Summary (Signed)
Physician Discharge Summary  Steve Anthony DXA:[id number] DOB: Jun 04, 1950 DOA: 03/06/2019  PCP: Steve Mina., MD  Admit date: 03/06/2019 Discharge date: 03/11/2019  Time spent: 40 minutes  Recommendations for Outpatient Follow-up:  1. Follow outpatient CBC/CMP 2. Follow with Dr. Lyndel Anthony for pancreatic pseudocyst  3. Discharged with 3 weeks of vancomycin/levaquin/flagyl - follow final culture results  4. Needs repeat imaging in about 3 weeks 5. Holding lasix/spironolactone on discharge - follow volume status/renal function outpatient and ?need for resumption 6. PICC to be removed after completion of abx  7. Follow pending cytology from aspirated pancreatic pseudocyst.  Amylase resulted 5,203.  Follow with GI.  Discharge Diagnoses:  Principal Problem:   Pseudocyst of pancreas Active Problems:   COPD without exacerbation ([place])   Cirrhosis of liver without ascites ([place])   Recurrent pancreatitis   CKD (chronic kidney disease), stage III   History of COVID-19 01/24/19   Discharge Condition: stable  Filed Weights   03/06/19 1123 03/07/19 0500 03/11/19 2094  Weight: 103.9 kg 103.7 kg 105.1 kg    History of present illness:  Steve Anthony Steve Anthony 69 y.o.malewith medical history significant ofchronic pancreatitis, coronary disease, Covid infection in December 2020, pancreatic cyst, chronic back pain comes in with several days of epigastric pain and back pain with Lacrisha Anthony lot of nausea and retching at home. He does not drink alcohol at all. He has not been vomiting blood. He denies any fevers. He denies any diarrhea. Patient was sent to [facility] from the [place] for work-up. His lipase is normal patient found to have an enlarging pancreatic cyst. GI advised medical admission for them to see in the morning. Patient is in Steve Anthony lot of pain right now as he is not had any of his oral pain meds since 830 this morning. He reports he is not been given any IV pain meds either. He reports  Dilaudid works well for him. He takes oxycodone 10 mg tablets every 4 hours at home. Patient be referred for admission for acute on chronic pancreatitis episode.  He was admitted for abdominal pain and found to have Steve Anthony pancreatic pseudocyst.  This was drained by IR and found to be growing enterococcus faecalis.  He was started on antibiotics and discharged on vanc/levaquin/flagyl after cultures resulted.  Plan for follow outpatient with PCP/GI for additional imaging, completion of course.  Hospital Course:  Abdominal Pain  History of Acute Pancreatitis c/b Pancreatic Pseudocyst:  Appreciate GI c/s - now signed off, they've made arrangements for follow up and CT scan in the future S/p CT aspiration of pancreatitic pseudocyst on 2/11 with 200 ml brown opaque fluid Culture with enterococcus faecalis - discussed with ID, recommended vancomycin while continuing levaquin and flagyl.  Plan for 3 weeks.  Needs repeat imaging.   Follow pending amylase (~5,203), cell count (turbid, brown fluid - unable to perform cell count), cytology (pending) Lipase at presentation wnl  CT on presentation with increase in size of large pancreatic tail pseudocystic with increased surrounding soft tissue stranding and wall thickening as well as stable changes of cirrhosis of the liver Appreciate Surgery recommendations - recommend f/u with Dr. Stark Anthony if it is thought that he needs surgical intervention at some point Continue home oxycodone regimen with IV pain meds for breakthrough - adjust as needed  Cirrhosis c/b Portal Hypertension Holding diuretics for now - BP soft Follows with Dr. Lyndel Anthony outpatient  Atrial Fibrillation: resume eliquis.  Continue home metoprolol.  COPD: prn  inhalers, no resp symptoms at this time  Depression  Anxiety: continue lexapro, trazodone, melatonin  T2DM: resume metformin at discharge  BPH: continue flomax  GERD: continue PPI  Anemia: stable  Thrombocytopenia:  chronic in setting of cirrhosis, follow  CKD III: creatinine improved from admission and from baseline, continue to monitor  Hypomagnesemia: resolved on discharge, follow outpatient  Procedures: CT aspiration by IR on 2/11  Consultations:  IR  Surgery  GI  Discharge Exam: Vitals:   03/11/19 0512 03/11/19 0614  BP:  121/77  Pulse: 63 63  Resp:  18  Temp:  98.7 F (37.1 C)  SpO2: (!) 89% 91%   No complaints, eager for discharge Discussed poc with pt and wife at bedside  General: No acute distress. Cardiovascular: Heart sounds show Steve Anthony regular rate, and rhythm Lungs: Clear to auscultation bilaterally  Abdomen: Soft, nontender, nondistended  Neurological: Alert and oriented 3. Moves all extremities 4 . Cranial nerves II through XII grossly intact. Skin: Warm and dry. No rashes or lesions. Extremities: No clubbing or cyanosis. No edema.    Discharge Instructions   Discharge Instructions    Call MD for:  difficulty breathing, headache or visual disturbances   Complete by: As directed    Call MD for:  extreme fatigue   Complete by: As directed    Call MD for:  hives   Complete by: As directed    Call MD for:  persistant dizziness or light-headedness   Complete by: As directed    Call MD for:  persistant nausea and vomiting   Complete by: As directed    Call MD for:  redness, tenderness, or signs of infection (pain, swelling, redness, odor or green/yellow discharge around incision site)   Complete by: As directed    Call MD for:  severe uncontrolled pain   Complete by: As directed    Call MD for:  temperature >100.4   Complete by: As directed    Diet - low sodium heart healthy   Complete by: As directed    Discharge instructions   Complete by: As directed    You were seen for pain related to Steve Anthony pancreatic pseudocyst.  This was infected and ended up growing enterococcus.  We're sending you home with 3 weeks of antibiotics.  You'll take levaquin and flagyl by  mouth.  You'll get vancomycin through the IV.  You will need serial labs to monitor your antibiotics and for adjustments to be made as needed.  You should follow up with Dr. Lyndel Anthony.  You will need Marylen Zuk repeat CT scan in [place] few weeks to make sure the fluid is not reaccumulating.   We stopped your lasix and spironolactone.  Please follow up with your PCP and/or Dr. Lyndel Anthony regarding when that can be resumed.  Return for new, recurrent, or worsening symptoms.  Please ask your PCP to request records from this hospitalization so they know what was done and what the next steps will be.   Home infusion instructions   Complete by: As directed    Instructions: Flushing of vascular access device: 0.9% NaCl pre/post medication administration and prn patency; Heparin 100 u/ml, 59m for implanted ports and Heparin 10u/ml, 58mfor all other central venous catheters.   Increase activity slowly   Complete by: As directed      Allergies as of 03/11/2019      Reactions   Penicillins Hives, Shortness Of Breath   Did it involve swelling of the face/tongue/throat, SOB, or low  BP? Yes Did it involve sudden or severe rash/hives, skin peeling, or any reaction on the inside of your mouth or nose? No Did you need to seek medical attention at [facility] or doctor's office? Yes (emergency room) When did it last happen?1974 If all above answers are "NO", may proceed with cephalosporin use.      Medication List    STOP taking these medications   furosemide 20 MG tablet Commonly known as: LASIX   spironolactone 25 MG tablet Commonly known as: ALDACTONE     TAKE these medications   acetaminophen 325 MG tablet Commonly known as: TYLENOL Take by mouth every 6 (six) hours as needed for headache.   albuterol 108 (90 Base) MCG/ACT inhaler Commonly known as: VENTOLIN HFA Inhale 2 puffs into the lungs every 4 (four) hours as needed for wheezing or shortness of breath.   Colace 100 MG capsule Generic drug: docusate  sodium Take 100 mg by mouth See admin instructions. Three times Billyjack Trompeter week   Eliquis 5 MG Tabs tablet Generic drug: apixaban TAKE 1 TABLET BY MOUTH TWICE Ashayla Subia DAY What changed: how much to take   escitalopram 10 MG tablet Commonly known as: LEXAPRO Take 10 mg by mouth daily.   fenofibrate 160 MG tablet Take 160 mg by mouth daily.   ferrous sulfate 325 (65 FE) MG tablet Take 325 mg by mouth See admin instructions. Take 1 tablet 2 times Majd Tissue week   gabapentin 300 MG capsule Commonly known as: NEURONTIN Take 300 mg by mouth 2 (two) times daily.   levofloxacin 750 MG tablet Commonly known as: Levaquin Take 1 tablet (750 mg total) by mouth daily for 18 days.   Melatonin 10 MG Tabs Take 10 mg by mouth at bedtime.   metFORMIN 500 MG tablet Commonly known as: GLUCOPHAGE Take 500 mg by mouth 2 (two) times daily.   metoprolol succinate 50 MG 24 hr tablet Commonly known as: TOPROL-XL TAKE 1 TABLET BY MOUTH TWICE Irelyn Perfecto DAY   metroNIDAZOLE 500 MG tablet Commonly known as: Flagyl Take 1 tablet (500 mg total) by mouth 3 (three) times daily for 18 days.   Narcan 4 MG/0.1ML Liqd nasal spray kit Generic drug: naloxone Place 1 spray into the nose daily as needed (opioid overdose).   nitroGLYCERIN 0.4 MG SL tablet Commonly known as: NITROSTAT Place 1 tablet (0.4 mg total) under the tongue every 5 (five) minutes x 3 doses as needed for chest pain.   OVER THE COUNTER MEDICATION Apply 1 application topically daily as needed (rash). OTC fanny cream   oxyCODONE-acetaminophen 10-325 MG tablet Commonly known as: PERCOCET Take 1 tablet by mouth every 4 (four) hours as needed for pain.   OXYGEN Inhale 2 L into the lungs daily as needed (shortness of breath).   pantoprazole 40 MG tablet Commonly known as: PROTONIX TAKE 1 TABLET BY MOUTH TWICE Darvis Croft DAY   promethazine 25 MG tablet Commonly known as: PHENERGAN Take 1 tablet (25 mg total) by mouth every 8 (eight) hours as needed for nausea or  vomiting.   tamsulosin 0.4 MG Caps capsule Commonly known as: FLOMAX Take 0.4 mg by mouth daily.   tiZANidine 4 MG tablet Commonly known as: ZANAFLEX Take 4 mg by mouth 2 (two) times daily. In the evening   traZODone 50 MG tablet Commonly known as: DESYREL Take 100 mg by mouth at bedtime.   vancomycin  IVPB Inject 1,000 mg into the vein every 12 (twelve) hours for 19 days. Indication:  infected  pancreatic pseudocyst Last Day of Therapy:  03/30/2019 Labs - Sunday/Monday:  CBC/D, BMP, and vancomycin trough. Labs - Thursday:  BMP and vancomycin trough Labs - Every other week:  ESR and CRP   VITAMIN B-12 IJ Inject 1,000 mcg as directed every 30 (thirty) days.            Home Infusion Instuctions  (From admission, onward)         Start     Ordered   03/11/19 0000  Home infusion instructions    Question:  Instructions  Answer:  Flushing of vascular access device: 0.9% NaCl pre/post medication administration and prn patency; Heparin 100 u/ml, 74m for implanted ports and Heparin 10u/ml, 573mfor all other central venous catheters.   03/11/19 1312         Allergies  Allergen Reactions  . Penicillins Hives and Shortness Of Breath    Did it involve swelling of the face/tongue/throat, SOB, or low BP? Yes Did it involve sudden or severe rash/hives, skin peeling, or any reaction on the inside of your mouth or nose? No Did you need to seek medical attention at [facility] or doctor's office? Yes (emergency room) When did it last happen?1974 If all above answers are "NO", may proceed with cephalosporin use.     [place] [facility].   Specialty: Home Health Services Why: Agency will provide home health nurse. Contact information: [address] [place] [phone]      GrRaina Mina MD Follow up.   Specialty: Internal Medicine Contact information: [address] [phone]       AlThompson GrayerMD .   Specialty: Cardiology Contact information: [address] [phone]      GuJackquline DenmarkMD Follow up.   Specialties: Gastroenterology, Internal Medicine Why: Follow outpatient for repeat CT scan and follow your lasix and spironolactone dosing Contact information: [address] [phone]          The results of significant diagnostics from this hospitalization (including imaging, microbiology, ancillary and laboratory) are listed below for reference.    Significant Diagnostic Studies: CT ABDOMEN PELVIS W CONTRAST  Addendum Date: 03/06/2019   ADDENDUM REPORT: 03/06/2019 14:08 ADDENDUM: The wall thickening involving the proximal descending colon is compatible with secondary involvement by the adjacent inflammatory changes. Electronically Signed   By: StClaudie Revering.D.   On: 03/06/2019 14:08   Result Date: 03/06/2019 CLINICAL DATA:  Mid to left abdominal pain since discharge from the hospital in late January 2021. EXAM: CT ABDOMEN AND PELVIS WITH CONTRAST TECHNIQUE: Multidetector CT imaging of the abdomen and pelvis was performed using the standard protocol following bolus administration of intravenous contrast. CONTRAST:  10013mMNIPAQUE IOHEXOL 300 MG/ML  SOLN COMPARISON:  02/20/2019 FINDINGS: Lower chest: Normal sized heart. Clear lung bases. No lung nodules seen today. Hepatobiliary: Stable diffuse low density of the liver relative to the spleen with enlargement of the caudate lobe and lateral segment of the left lobe of the liver. Cholecystectomy clips. Pancreas: The previously demonstrated 9.5 x 8.1 cm cyst in the tail of the pancreas currently measures 12.0 x 8.7 cm in maximum dimensions. Kahla Risdon small area of adjacent oval low density in the tail of the pancreas is unchanged. Mild peripancreatic soft tissue stranding with improvement with the exception of increased  soft  tissue stranding adjacent to the inferior aspect of the large cyst in the tail of the pancreas with interval mild, ill-defined wall thickening of that portion of the cyst. The cyst is partially encasing the splenic artery and splenic veins. There is also Raneen Jaffer smaller cystic area lateral to the larger cyst, inferior to the spleen weight is has increased mildly in size with mildly increased surrounding wall thickness with enhancement and adjacent soft tissue stranding. This smaller cyst measures 1.6 cm in maximum diameter on coronal image number 45 series 5. Spleen: No significant change in diffuse enlargement of the spleen Adrenals/Urinary Tract: Normal appearing adrenal glands. Previously demonstrated bilateral renal cysts. Unremarkable urinary bladder and ureters. Stomach/Bowel: Proximal sigmoid colon anastomosis. Mild soft tissue thickening involving the walls of the proximal descending colon adjacent to the soft tissue stranding associated with the inferior portion of the large pancreatic tail cyst. Multiple diverticula at the junction of the descending colon and sigmoid colon. Normal appearing appendix. Unremarkable stomach and small bowel. Vascular/Lymphatic: Large number of upper abdominal varices. Atheromatous calcifications without aneurysm. No enlarged lymph nodes. Reproductive: Mildly enlarged prostate gland containing coarse calcifications. Other: Tiny umbilical hernia containing fat. Small right hydrocele. Musculoskeletal: Right hip prosthesis. L5-S1 interbody and pedicle screw and rod fusion. Lumbar and lower thoracic spine degenerative changes. IMPRESSION: 1. Interval mild increase in size of Jelissa Espiritu large pancreatic tail pseudocyst with mildly increased surrounding soft tissue stranding and wall thickening inferiorly. This is compatible with interval inflammatory/infectious changes involving the inferior portion of the cyst and an adjacent 1.6 cm component. 2. Stable changes of cirrhosis of the liver with  portal venous hypertension, including splenomegaly and large number of upper abdominal varices. 3. Colonic diverticulosis. 4. Small right hydrocele. Electronically Signed: By: Claudie Revering M.D. On: 03/06/2019 13:34   CT ABDOMEN PELVIS W CONTRAST  Result Date: 02/20/2019 CLINICAL DATA:  Nausea and abdominal pain. EXAM: CT ABDOMEN AND PELVIS WITH CONTRAST TECHNIQUE: Multidetector CT imaging of the abdomen and pelvis was performed using the standard protocol following bolus administration of intravenous contrast. CONTRAST:  93m OMNIPAQUE IOHEXOL 300 MG/ML  SOLN COMPARISON:  Multiple prior CT scans from 2020. The most recent is November 09, 2018. FINDINGS: Lower chest: The lung bases are clear of an acute process. Small left basilar pulmonary nodules are stable. There is also Andrina Locken calcified granuloma at the right lung base. The heart is normal in size. No pericardial effusion. Stable age advanced coronary artery calcifications. Hepatobiliary: Stable cirrhotic changes involving the liver with mild liver contour abnormality, dilated hepatic fissures, increased caudate to right lobe ratio, portal venous hypertension, portal venous collaterals and extensive gastric varices. Diffuse hepatic steatosis again demonstrated. No focal hepatic lesions. The portal and hepatic veins are patent. The gallbladder is surgically absent. No common bile duct dilatation. Pancreas: CT findings of acute pancreatitis with pancreatic and peripancreatic inflammatory changes. I do not see any findings for pancreatic necrosis. Enlarging well-formed pseudocyst in the pancreatic tail extending to the splenic hilum. This measures 9.5 x 8.0 cm and previously measured 7.5 x 6.5 cm. Spleen: Stable splenomegaly. No splenic lesions or splenic infarction. Adrenals/Urinary Tract: The adrenal glands are unremarkable and stable. Stable bilateral renal cysts. No worrisome renal lesions. No collecting system abnormalities are identified. The bladder is  unremarkable. Stomach/Bowel: The stomach, duodenum, small bowel and colon are grossly normal without oral contrast. Stable duodenal diverticuli. No acute inflammatory changes, mass lesions or obstructive findings. The terminal ileum and appendix are normal. Vascular/Lymphatic: Stable vascular calcifications.  No aneurysm or dissection. The major venous structures are patent. The the splenic vein is occluded near the pancreatic pseudocyst but is reconstituted near the portal splenic confluence. The portal vein is patent. No mesenteric or retroperitoneal lymphadenopathy. No pelvic adenopathy. Reproductive: The prostate gland and seminal vesicles are unremarkable. Other: No pelvic mass or free pelvic fluid collections. No inguinal mass or adenopathy or hernia. Musculoskeletal: Moderate artifact from Samir Ishaq right hip prosthesis. Stable L5-S1 fusion hardware. No significant or acute bony findings. IMPRESSION: 1. CT findings consistent with acute pancreatitis. No findings for pancreatic necrosis. 2. Enlarging pancreatic pseudocyst. 3. Stable cirrhotic changes involving the liver with portal venous hypertension, portal venous collaterals and splenomegaly. No focal hepatic lesions. 4. Stable small left basilar pulmonary nodules. Electronically Signed   By: Marijo Sanes M.D.   On: 02/20/2019 12:50   CT ASPIRATION  Result Date: 03/08/2019 INDICATION: 69 year old male with pancreatitis and enlarging symptomatic pseudocyst with adjacent inflammatory changes. He presents for CT-guided aspiration. EXAM: CT-guided aspiration MEDICATIONS: None ANESTHESIA/SEDATION: Fentanyl 100 mcg IV; Versed 3 mg IV Moderate Sedation Time:  12 minutes The patient was continuously monitored during the procedure by the interventional radiology nurse under my direct supervision. COMPLICATIONS: None immediate. PROCEDURE: Informed written consent was obtained from the patient after Deovion Batrez thorough discussion of the procedural risks, benefits and alternatives.  All questions were addressed. Deloros Beretta timeout was performed prior to the initiation of the procedure. Jourdin Connors planning axial CT scan was performed. The complex pseudocyst was identified. Kya [place] suitable skin entry site was selected and marked. The overlying skin was sterilely prepped and draped in the standard fashion using chlorhexidine skin prep. Local anesthesia was attained by infiltration with 1% lidocaine. Shell Blanchette small dermatotomy was made. Under intermittent CT guidance, an 18 gauge trocar needle was advanced into the fluid collection. Aspiration was then performed. Approximately 200 mL thick brown fluid was aspirated. Samples were sent for Gram stain and culture. Follow-up CT imaging demonstrates significant reduction in volume of the pseudocyst. IMPRESSION: Successful aspiration of 200 mL thick brown opaque fluid from the pancreatic pseudocyst. Naiyah Klostermann sample was sent for Gram stain and culture. Electronically Signed   By: Jacqulynn Cadet M.D.   On: 03/08/2019 15:44   [place] EKG SITE RITE  Result Date: 03/10/2019 If Site Rite image not attached, placement could not be confirmed due to current cardiac rhythm.   Microbiology: Recent Results (from the past 240 hour(s))  Aerobic/Anaerobic Culture (surgical/deep wound)     Status: None (Preliminary result)   Collection Time: 03/08/19 10:57 AM   Specimen: Wound; Abdominal Fluid  Result Value Ref Range Status   Specimen Description   Final    WOUND PANCREATIC PSEUDOCYST ASPIRATE Performed at [facility] [address] [place], [place] [address]    Special Requests   Final    Normal Performed at [facility], [place] [address], [place], [place] [address]    Gram Stain   Final    FEW WBC PRESENT, PREDOMINANTLY PMN FEW GRAM POSITIVE COCCI IN CHAINS Performed at [facility], [place] [address], [place], [place] [address]    Culture   Final    ABUNDANT ENTEROCOCCUS FAECALIS NO ANAEROBES ISOLATED; CULTURE IN PROGRESS FOR 5 DAYS     Report Status PENDING  Incomplete   Organism ID, Bacteria ENTEROCOCCUS FAECALIS  Final      Susceptibility   Enterococcus faecalis - MIC*    AMPICILLIN <=2 SENSITIVE Sensitive     VANCOMYCIN 1 SENSITIVE Sensitive  GENTAMICIN SYNERGY SENSITIVE Sensitive     * ABUNDANT ENTEROCOCCUS FAECALIS     Labs: Basic Metabolic Panel: Recent Labs  Lab 03/07/19 0425 03/08/19 0426 03/09/19 0431 03/10/19 0455 03/11/19 0538  NA 139 139 137 136 139  K 3.9 4.0 3.8 3.8 4.0  CL 105 104 103 101 103  CO2 26 27 27 27 26   GLUCOSE 125* 126* 127* 168* 164*  BUN 13 10 10 8  6*  CREATININE 1.02 1.06 1.06 1.11 1.05  CALCIUM 8.3* 8.5* 8.4* 8.4* 8.5*  MG  --  1.1* 1.7 1.2* 2.0  PHOS  --  3.6 3.8 3.2 3.2   Liver Function Tests: Recent Labs  Lab 03/07/19 0425 03/08/19 0426 03/09/19 0431 03/10/19 0455 03/11/19 0538  AST 10* 12* 11* 10* 11*  ALT 13 12 9 9 8   ALKPHOS 66 61 56 51 49  BILITOT 1.0 0.8 0.7 0.6 0.4  PROT 5.9* 6.0* 5.4* 5.3* 5.6*  ALBUMIN 2.7* 2.7* 2.4* 2.5* 2.6*   Recent Labs  Lab 03/06/19 1149  LIPASE 31   No results for input(s): AMMONIA in the last 168 hours. CBC: Recent Labs  Lab 03/06/19 1149 03/06/19 1149 03/07/19 0425 03/07/19 0425 03/08/19 0426 03/09/19 0431 03/09/19 1505 03/10/19 0455 03/11/19 0538  WBC 5.1   < > 3.3*  --  3.3* 3.2*  --  3.0* 3.2*  NEUTROABS 4.3  --   --   --  2.6 2.4  --  2.3 2.5  HGB 13.0   < > 10.9*   < > 11.0* 9.8* 10.1* 10.2* 10.4*  HCT 41.2   < > 35.0*   < > 35.9* 32.3* 32.8* 33.1* 33.2*  MCV 87.5   < > 89.1  --  89.5 89.7  --  89.0 87.4  PLT 127*   < > 93*  --  106* 104*  --  91* 112*   < > = values in this interval not displayed.   Cardiac Enzymes: No results for input(s): CKTOTAL, CKMB, CKMBINDEX, TROPONINI in the last 168 hours. BNP: BNP (last 3 results) No results for input(s): BNP in the last 8760 hours.  ProBNP (last 3 results) No results for input(s): PROBNP in the last 8760 hours.  CBG: Recent Labs  Lab  03/10/19 1155 03/10/19 1702 03/10/19 2102 03/11/19 0749 03/11/19 1150  GLUCAP 135* 100* 148* 144* 218*       Signed:  Fayrene Helper MD.  [facility] 03/11/2019, 3:45 PM

## 2019-03-12 LAB — CYTOLOGY - NON PAP

## 2019-03-13 LAB — AEROBIC/ANAEROBIC CULTURE W GRAM STAIN (SURGICAL/DEEP WOUND): Special Requests: NORMAL

## 2019-03-20 ENCOUNTER — Telehealth: Payer: Self-pay | Admitting: Nurse Practitioner

## 2019-03-20 DIAGNOSIS — R112 Nausea with vomiting, unspecified: Secondary | ICD-10-CM

## 2019-03-20 MED ORDER — ONDANSETRON 4 MG PO TBDP: 4.0000 mg | ORAL_TABLET | Freq: Four times a day (QID) | ORAL | 1 refills | Status: DC | PRN

## 2019-03-20 NOTE — Telephone Encounter (Signed)
Please review previous message and advise

## 2019-03-20 NOTE — Telephone Encounter (Signed)
RX sent to pharmacy listed in patient's chart

## 2019-03-20 NOTE — Telephone Encounter (Signed)
Called the patient for an update.  He was discharged from the hospital on 2/14. I reviewed his cytology report of the aspirated pancreatic pseudocyst fluid was negative, no malignant cells were identified.  The cultures grew enterococcus faecalis for which he remains on vancomycin IV per PICC line, oral Flagyl and Levaquin to complete a total of 3 weeks.  He continues to have nausea and vomits secretions or partially digested food once daily.  Promethazine is not reducing his nausea.  He took his mother's nausea medication, ondansetron, and his nausea was significantly reduced. I will order RX for Ondansetron.  Reported having significant left mid abdominal pain yesterday to the point where he cried.  Today his pain is less and he is feeling better.  I have sent a message to Dr. Lyndel Safe to verify if the patient should be seen in the office earlier than his scheduled 04/02/2019 appointment and to verify if a repeat abdominal/pelvic CT should be done prior to that appointment.  I will call the patient back with Dr. Steve Rattler input.  The patient will call our office if his symptoms worsen.  Bre, pls send RX for Ondansetron 11m ODT one tab dissolve on tongue Q 6 hrs PRN nausea. # 30, 1 additional refill. thx

## 2019-03-26 ENCOUNTER — Telehealth: Payer: Self-pay | Admitting: Gastroenterology

## 2019-03-26 NOTE — Telephone Encounter (Signed)
Called and spoke with patient-patient is requesting to know if he can have the COVID vaccine ?  please advise  Patient given the number to call and schedule his CT scan at [facility] at [phone]

## 2019-03-26 NOTE — Telephone Encounter (Signed)
Patient called to schedule CT scan prior to appt with Dr. Lyndel Safe also would like to know if he can get covid vaccine

## 2019-03-27 NOTE — Telephone Encounter (Signed)
Called and spoke with patient-patient reports he already had the COVID vaccine yesterday- Also reports the CT scan has been scheduled for 03/29/2019 at 9:00 am at [facility]; Patient advised to call back to the office at [phone] should questions/concerns arise;  Patient verbalized understanding of information/instructions;

## 2019-03-27 NOTE — Telephone Encounter (Signed)
He should get Covid vaccine RG

## 2019-03-28 ENCOUNTER — Ambulatory Visit (HOSPITAL_BASED_OUTPATIENT_CLINIC_OR_DEPARTMENT_OTHER): Payer: Medicare PPO

## 2019-03-29 ENCOUNTER — Encounter (HOSPITAL_BASED_OUTPATIENT_CLINIC_OR_DEPARTMENT_OTHER): Payer: Self-pay

## 2019-03-29 ENCOUNTER — Other Ambulatory Visit: Payer: Self-pay

## 2019-03-29 ENCOUNTER — Ambulatory Visit (HOSPITAL_BASED_OUTPATIENT_CLINIC_OR_DEPARTMENT_OTHER)
Admission: RE | Admit: 2019-03-29 | Discharge: 2019-03-29 | Disposition: A | Discharge status: Home / Self Care | Payer: Medicare PPO | Source: Ambulatory Visit | Attending: Gastroenterology | Admitting: Gastroenterology

## 2019-03-29 DIAGNOSIS — R197 Diarrhea, unspecified: Secondary | ICD-10-CM | POA: Diagnosis present

## 2019-03-29 DIAGNOSIS — R109 Unspecified abdominal pain: Secondary | ICD-10-CM | POA: Insufficient documentation

## 2019-03-29 DIAGNOSIS — K7460 Unspecified cirrhosis of liver: Secondary | ICD-10-CM | POA: Diagnosis present

## 2019-03-29 MED ORDER — IOHEXOL 300 MG/ML  SOLN
100.0000 mL | Freq: Once | INTRAMUSCULAR | Status: AC | PRN
  Administered 2019-03-29: 100 mL via INTRAVENOUS

## 2019-04-02 ENCOUNTER — Ambulatory Visit: Payer: Medicare PPO | Admitting: Gastroenterology

## 2019-04-02 ENCOUNTER — Other Ambulatory Visit: Payer: Self-pay

## 2019-04-02 ENCOUNTER — Encounter: Payer: Self-pay | Admitting: Gastroenterology

## 2019-04-02 VITALS — BP 106/74 | HR 83 | Temp 97.1°F | Ht 71.0 in | Wt 226.2 lb

## 2019-04-02 DIAGNOSIS — K863 Pseudocyst of pancreas: Secondary | ICD-10-CM

## 2019-04-02 DIAGNOSIS — R112 Nausea with vomiting, unspecified: Secondary | ICD-10-CM | POA: Diagnosis not present

## 2019-04-02 MED ORDER — PANCRELIPASE (LIP-PROT-AMYL) 36000-114000 UNITS PO CPEP: ORAL_CAPSULE | ORAL | 0 refills | Status: DC

## 2019-04-02 NOTE — Patient Instructions (Signed)
If you are age 68 or older, your body mass index should be between 23-30. Your Body mass index is 31.56 kg/m. If this is out of the aforementioned range listed, please consider follow up with your Primary Care Provider.  If you are age 42 or younger, your body mass index should be between 19-25. Your Body mass index is 31.56 kg/m. If this is out of the aformentioned range listed, please consider follow up with your Primary Care Provider.   You have been referred to Dr. [name], you will be contacted with this appointment.   We have given you samples of the following medication to take: Creon 36,000 one with each meal.   Thank you,  Dr. Jackquline Denmark

## 2019-04-02 NOTE — Progress Notes (Signed)
Chief Complaint: FU   Referring Provider:  [name], MD      ASSESSMENT AND PLAN;   #1. H/O acute idiopathic recurrent pancreatitis with enlarging, infected pancreatic tail pseudocyst. EUS (Dr Rush Landmark) 06/2018 6.5 cm x 5.4 cm pseudocyst with surrounding blood vessels and too far for AXIOS.  Adm to [facility] 2/9-2/14/2021 d/t symptomatic enlarging pancreatic pseudocyst. Pancreatic cyst fluid aspiration (by IR) grew enterococcus faecalis, treated with vancomycin IV via PICC line and levaquin and flagyl po x 3 weeks per ID (done 3/5). Neg cytology. Not amenable to endoscopic cyst gastrostomy d/t location and surrounding structures (Dr Rush Landmark). Rpt CT 03/29/2019- 12.5 x 9 cm pseudocyst without air.  Adm [facility] 02/15/2018, June, July, dec 2019, 08/2010) with  pancreatic tail pseudocyst 7 x 9 cm (on CT urogram 09/22/2017). H/O remote ETOH abuse, none since 1989. S/P chole in past. H/O hypertriglyceridemia. Nl LFTs 08/2017, most recent TG 278 (09/30/2017), Nl lipase.  No clinical exocrine insufficiency or pancreatic calcification on noncontrast CT. No chronic pancreatitis on EUS.  #2. Cryptogenic liver cirrhosis (likely d/t NASH, Dx 02/2018 on CT) with portal hypertension- moderate splenomegaly (on CT), mild thrombocytopenia, portal venous collaterals. Gd 1 eso varices on EGD (06/2018). Neg WU for etiology 2020. Immune to A but not to B. No ascites or HE. Nl AFP.  #3. Multiple comorbid conditions including A. fib on Eliquis, COPD on O2, OSA, CKD3 (Cr 1.59 09/2017), CAD, dCHF, DM2, obesity, remote PE.  #4. Has Covid-19 currently-asymptomatic.  Plan: -Appt with Dr Stark Klein (surgery) -Continue low salt diet.  Avoid fatty fried foods.  Small but more frequent meals. -Continue taking Protonix, Spironolactone. -Continue oxycodone as needed. -Continue protonix 56m po qd -Please obtain previous recent blood work fro [facility]( done yesterday) -Panc enzymes Creon (lip 36,000) 1 with each meal.  Samples given.  -If still with diarrhea, check stool for C. difficile WBCs GI pathogens.   HPI:    Steve ENISis a 69y.o. male  For follow-up visit from recent admission 2/9-2/14 d/t pancreatitis/symptomatic enlarging pancreatic pseudocyst.  Pancreatic cyst aspiration grew Enterococcus faecalis.  ID was consulted.  He has been given IV vancomycin via PICC line, Levaquin/Flagyl for 3 weeks.  He was finally done on 03/30/2019.  He underwent repeat CT Abdo/pelvis 03/29/2019 showing stable large pseudocyst in the tail of the pancreas.  No air.  New small splenic infarction.  Portal hypertension with extensive venous collateralization.  Does feel little better.  Still has early satiety, occasional nausea, left-sided abdominal pain and has not been eating well.  He has lost 5 pounds over last 1 month.  No jaundice dark urine or pale stools.  Lately has been having looser BMs 4-5/day without nocturnal symptoms.  Had COVID-19 01/28/2019, (got it from his son-in-law).  His wife had it as well.   S/P EGD/EUS as below.  Wt Readings from Last 3 Encounters:  04/02/19 226 lb 4 oz (102.6 kg)  03/11/19 231 lb 12.8 oz (105.1 kg)  03/06/19 229 lb (103.9 kg)     Previous imaging studies :  CT AP 03/29/2019 1. Stable large pseudocyst 12.5x 9 cm in the tail of the pancreas. No interval change. 2. Portal hypertension with extensive venous collateralization. 3. New small splenic infarction.  - NCCT scan 09/22/2017 -showed large 7 into 9 cm pancreatic pseudocyst in the tail of the pancreas. - CT Abdo/pelvis with IV contrast  11/09/2018, 05/10/2018:  1. Pancreatic tail pseudocyst .7.5 x 6.5 by 5.9 cm  2. Hepatic cirrhosis and mild steatosis. No evidence of hepatic neoplasm. 3. Stable splenomegaly. -MRCP 02/2018 ? Hemo psudocyst -EGD/EUS 07/17/2018: Grade 1 esophageal varices, portal hypertensive gastropathy, cystic 6.5x5.4 cm pancreatic tail pseudocyst with surrounding blood vessels.   SH- daughter is a Therapist, sports, Mudlogger of  [place]  (April phone [phone]) Past Medical History:  Diagnosis Date  . Arthritis   . CAD (coronary artery disease)    a. S/P PCI mid LAD (vision stent) 08/31/2004;  b. repeat cath 6/11 - no progression of CAD. c. Cath 10/2012 given moderate CAD on cardiac CT - no change from prior cath.  . Chest pain 12/16/2016  . COPD (chronic obstructive pulmonary disease) ([place])   . COVID-19 virus infection   . Diverticulitis   . DJD (degenerative joint disease) of lumbar spine   . Dyslipidemia   . Gallstones   . GERD (gastroesophageal reflux disease)   . History of kidney stones   . Hypertension   . Hyperthyroidism   . Melanoma ([place])    melanoma removed from neck   . On home oxygen therapy    "2L q hs" (11/08/2012)  . OSA (obstructive sleep apnea)    a. cpap noncompliance- patient reports on 12/10/14- does not use CPAP  . Palpitations    a. 10/2012 - s/p LINQ loop recorder to assess for arrhythmia.   . Pancreatitis   . Paroxysmal A-fib ([place])    a. S/P PVI 2/11 and 9/11  . SVT (supraventricular tachycardia) ([place])    a. Atypical AVNRT of the slow pathway  . Type II diabetes mellitus ([place])     Past Surgical History:  Procedure Laterality Date  . ATRIAL ABLATION SURGERY  2007   S/P slow pathway ablation for atypical AVNRT   . BIOPSY  07/17/2018   Procedure: BIOPSY;  Surgeon: Rush Landmark Telford Nab., MD;  Location: [place];  Service: Gastroenterology;;  . Lorin [facility]cy  ~ 2008  . COLON RESECTION  02/2013   . COLONOSCOPY  12/25/2007   Colonic polyp status post polypectomy. Internal hemorrhoids.   . CORONARY ANGIOPLASTY WITH STENT PLACEMENT  08/30/2004   Vision; to LAD   . CYSTOSCOPY W/ STONE MANIPULATION  ~ 1998   "once" (11/08/2012)  . EP Study  2008   Negative EP study in [place]  . ESOPHAGOGASTRODUODENOSCOPY (EGD) WITH PROPOFOL N/A 07/17/2018   Procedure: ESOPHAGOGASTRODUODENOSCOPY (EGD) WITH PROPOFOL;  Surgeon: Rush Landmark Telford Nab., MD;  Location: [place];   Service: Gastroenterology;  Laterality: N/A;  . FRACTURE SURGERY    . LEFT HEART CATHETERIZATION WITH CORONARY ANGIOGRAM N/A 11/10/2012   Procedure: LEFT HEART CATHETERIZATION WITH CORONARY ANGIOGRAM;  Surgeon: Burnell Blanks, MD;  Location[facility] C[facility];  Service: Cardiovascular;  Laterality: N/A;  . LOOP RECORDER IMPLANT N/A 11/10/2012   Medtronic LinQ implanted by Dr Rayann Heman for afib management  . [place] SURGERY  2000; 2002  . POSTERIOR LUMBAR FUSION  2004  . PROSTATE SURGERY  ~ 2009  . PVI/ CTI ablation  02/25/2009 and 09/2009   S/P afib/ CTI ablation  . TIBIA FRACTURE SURGERY Right 1990   "broke in 3 places; had rod put in" (11/08/2012)  . TIBIA HARDWARE REMOVAL Right ~ 1991  . TONSILLECTOMY  1965  . TOTAL HIP ARTHROPLASTY Right 12/18/2014   Procedure: RIGHT TOTAL HIP ARTHROPLASTY ANTERIOR APPROACH;  Surgeon: Gaynelle Arabian, MD;  Location: [facility];  Service: Orthopedics;  Laterality: Right;  . UPPER ESOPHAGEAL ENDOSCOPIC ULTRASOUND (EUS) N/A 07/17/2018   Procedure: UPPER ESOPHAGEAL ENDOSCOPIC ULTRASOUND (  EUS);  Surgeon: Mansouraty, Telford Nab., MD;  Location: [place];  Service: Gastroenterology;  Laterality: N/A;    Family History  Problem Relation Age of Onset  . Emphysema Mother   . Heart attack Father        CVA  . Hypertension Father   . Diabetes Father   . Heart disease Father        MI  . Emphysema Maternal Grandfather        Smoker  . Heart disease Paternal Grandfather   . Colon cancer Neg Hx   . Esophageal cancer Neg Hx   . Inflammatory bowel disease Neg Hx   . Liver disease Neg Hx   . Pancreatic cancer Neg Hx   . Rectal cancer Neg Hx   . Stomach cancer Neg Hx     Social History   Tobacco Use  . Smoking status: Former Smoker    Packs/day: 3.00    Years: 38.00    Pack years: 114.00    Types: Cigarettes    Quit date: 03/25/2004    Years since quitting: 15.0  . Smokeless tobacco: Former Systems developer    Types: Chew    Quit date: 04/25/2004  Substance Use  Topics  . Alcohol use: Not Currently    Comment:  "quit drinking in 1989"  . Drug use: No    Current Outpatient Medications  Medication Sig Dispense Refill  . acetaminophen (TYLENOL) 325 MG tablet Take by mouth every 6 (six) hours as needed for headache.    . albuterol (VENTOLIN HFA) 108 (90 Base) MCG/ACT inhaler Inhale 2 puffs into the lungs every 4 (four) hours as needed for wheezing or shortness of breath.     . Cyanocobalamin (VITAMIN B-12 IJ) Inject 1,000 mcg as directed every 30 (thirty) days.    Marland Kitchen docusate sodium (COLACE) 100 MG capsule Take 100 mg by mouth See admin instructions. Three times a week    . ELIQUIS 5 MG TABS tablet TAKE 1 TABLET BY MOUTH TWICE A DAY (Patient taking differently: Take 5 mg by mouth 2 (two) times daily. ) 180 tablet 1  . escitalopram (LEXAPRO) 10 MG tablet Take 10 mg by mouth daily.  1  . fenofibrate 160 MG tablet Take 160 mg by mouth daily.      . ferrous sulfate 325 (65 FE) MG tablet Take 325 mg by mouth See admin instructions. Take 1 tablet 2 times a week    . gabapentin (NEURONTIN) 300 MG capsule Take 300 mg by mouth 2 (two) times daily.     . Melatonin 10 MG TABS Take 10 mg by mouth at bedtime.     . metFORMIN (GLUCOPHAGE) 500 MG tablet Take 500 mg by mouth 2 (two) times daily.    . metoprolol succinate (TOPROL-XL) 50 MG 24 hr tablet TAKE 1 TABLET BY MOUTH TWICE A DAY (Patient taking differently: Take 50 mg by mouth 2 (two) times daily. ) 60 tablet 3  . nitroGLYCERIN (NITROSTAT) 0.4 MG SL tablet Place 1 tablet (0.4 mg total) under the tongue every 5 (five) minutes x 3 doses as needed for chest pain. 25 tablet 1  . ondansetron (ZOFRAN ODT) 4 MG disintegrating tablet Take 1 tablet (4 mg total) by mouth every 6 (six) hours as needed for nausea or vomiting. Place under tongue 30 tablet 1  . OVER THE COUNTER MEDICATION Apply 1 application topically daily as needed (rash). OTC fanny cream    . oxyCODONE-acetaminophen (PERCOCET) 10-325 MG tablet Take 1 tablet  by mouth every 4 (four) hours as needed for pain.     . OXYGEN Inhale 2 L into the lungs daily as needed (shortness of breath).     . pantoprazole (PROTONIX) 40 MG tablet TAKE 1 TABLET BY MOUTH TWICE A DAY (Patient taking differently: Take 40 mg by mouth 2 (two) times daily. ) 180 tablet 1  . promethazine (PHENERGAN) 25 MG tablet Take 1 tablet (25 mg total) by mouth every 8 (eight) hours as needed for nausea or vomiting. 30 tablet 2  . Tamsulosin HCl (FLOMAX) 0.4 MG CAPS Take 0.4 mg by mouth daily.      Marland Kitchen tiZANidine (ZANAFLEX) 4 MG tablet Take 4 mg by mouth 2 (two) times daily. In the evening    . traZODone (DESYREL) 50 MG tablet Take 100 mg by mouth at bedtime.     . naloxone (NARCAN) 4 MG/0.1ML LIQD nasal spray kit Place 1 spray into the nose daily as needed (opioid overdose).     No current facility-administered medications for this visit.    Allergies  Allergen Reactions  . Penicillins Hives and Shortness Of Breath    Did it involve swelling of the face/tongue/throat, SOB, or low BP? Yes Did it involve sudden or severe rash/hives, skin peeling, or any reaction on the inside of your mouth or nose? No Did you need to seek medical attention at a hospital or doctor's office? Yes (emergency room) When did it last happen?1974 If all above answers are "NO", may proceed with cephalosporin use.      Review of Systems:  neg     Physical Exam:    BP 106/74   Pulse 83   Temp (!) 97.1 F (36.2 C)   Ht 5' 11"  (1.803 m)   Wt 226 lb 4 oz (102.6 kg)   BMI 31.56 kg/m  Filed Weights   04/02/19 1318  Weight: 226 lb 4 oz (102.6 kg)   Constitutional:  Well-developed, in no acute distress. Psychiatric: Normal mood and affect. Behavior is normal. Abdominal exam: Vague left upper quadrant abdominal tender area without rebound.  Bowel sounds are present. Lungs: Clear bilaterally  Data Reviewed: I have personally reviewed following labs and imaging studies  CBC Latest Ref Rng & Units  03/11/2019 03/10/2019 03/09/2019  WBC 4.0 - 10.5 K/uL 3.2(L) 3.0(L) -  Hemoglobin 13.0 - 17.0 g/dL 10.4(L) 10.2(L) 10.1(L)  Hematocrit 39.0 - 52.0 % 33.2(L) 33.1(L) 32.8(L)  Platelets 150 - 400 K/uL 112(L) 91(L) -     CBC: CBC Latest Ref Rng & Units 03/11/2019 03/10/2019 03/09/2019  WBC 4.0 - 10.5 K/uL 3.2(L) 3.0(L) -  Hemoglobin 13.0 - 17.0 g/dL 10.4(L) 10.2(L) 10.1(L)  Hematocrit 39.0 - 52.0 % 33.2(L) 33.1(L) 32.8(L)  Platelets 150 - 400 K/uL 112(L) 91(L) -    CMP: CMP Latest Ref Rng & Units 03/11/2019 03/10/2019 03/09/2019  Glucose 70 - 99 mg/dL 164(H) 168(H) 127(H)  BUN 8 - 23 mg/dL 6(L) 8 10  Creatinine 0.61 - 1.24 mg/dL 1.05 1.11 1.06  Sodium 135 - 145 mmol/L 139 136 137  Potassium 3.5 - 5.1 mmol/L 4.0 3.8 3.8  Chloride 98 - 111 mmol/L 103 101 103  CO2 22 - 32 mmol/L 26 27 27   Calcium 8.9 - 10.3 mg/dL 8.5(L) 8.4(L) 8.4(L)  Total Protein 6.5 - 8.1 g/dL 5.6(L) 5.3(L) 5.4(L)  Total Bilirubin 0.3 - 1.2 mg/dL 0.4 0.6 0.7  Alkaline Phos 38 - 126 U/L 49 51 56  AST 15 - 41 U/L 11(L) 10(L) 11(L)  ALT 0 - 44 U/L 8 9 9     Carmell Austria, MD 04/02/2019, 1:29 PM  Cc: [name], MD

## 2019-04-03 ENCOUNTER — Telehealth: Payer: Self-pay | Admitting: Gastroenterology

## 2019-04-03 NOTE — Telephone Encounter (Signed)
Pt stated that he had discussed surgery with Dr. Lyndel Safe.  He reported that he has a PICC line in his arm and would like to discuss interference with surgery.

## 2019-04-03 NOTE — Telephone Encounter (Addendum)
Called and spoke with patient-Patient is requesting to know if Dr. [name] will allow him to keep the PICC line in for this upcoming sx- patient was advised to inform the RN that is coming to change the bandage and assess the PICC line to send a message to the MD that is managing the PICC line to ask about "geeping this in until I have my surgery with Dr. [name]";  Called and spoke with triage RN at [facility]-referral paperwork has not been received (no appt as of today);  Larena Glassman -please include this request when referral has been faxed

## 2019-04-03 NOTE — Telephone Encounter (Signed)
I have called and spoke to [place] the scheduler at [facility] and given her this information. Referral has been faxed and [facility] will call patient with appointment date and time.

## 2019-04-04 NOTE — Telephone Encounter (Signed)
Patient has an appointment 04/16/19 at 2pm, patient was informed.

## 2019-04-04 NOTE — ED Provider Notes (Signed)
Late addendum to evaluation of 02/20/19 WDWN male nad Heent normal Neck supple CV-RRR Abdomen- soft with epigastric ttp Extremities no acute abnormalities Awake and alert Psych- no si/hi     Pattricia Boss, MD 04/04/19 1248

## 2019-04-12 ENCOUNTER — Telehealth: Payer: Self-pay | Admitting: Gastroenterology

## 2019-04-12 NOTE — Telephone Encounter (Signed)
Called and spoke with patient-patient is requesting to know which medications he can have for the abdominal pain as the pain clinic MD-Dr.Bradley Ishmael Holter in [place] [phone] wants Dr.Gupta to list what medications he can prescribe for the patient with his dx- Please advise

## 2019-04-12 NOTE — Telephone Encounter (Signed)
Patient called states he saw his pain management doctor yesterday and the doctor is recommending that he does not to take any medications for pain if they are not prescribed by him so the patient is asking if we can let that doctor know what he can take for his abdominal pain.

## 2019-04-13 NOTE — Telephone Encounter (Signed)
I tried calling the office this afternoon. They are closed for today being Friday. Can use hydrocodone/APAP as needed Or any other medications except nonsteroidals  RG

## 2019-04-16 ENCOUNTER — Other Ambulatory Visit: Payer: Self-pay | Admitting: General Surgery

## 2019-04-16 NOTE — Telephone Encounter (Signed)
Called and spoke with Vonna Kotyk, Dr. Ishmael Holter' RN-gave information regarding pain medications that were "okay for patient to use" ; advised Vonna Kotyk to call back to the office should further questions or concerns arise at [phone];

## 2019-04-17 ENCOUNTER — Telehealth: Payer: Self-pay | Admitting: *Deleted

## 2019-04-17 NOTE — Telephone Encounter (Signed)
Pharmacy, please comment on how long patient can hold Eliquis prior to upcoming procedure listed below.  Thank you!

## 2019-04-17 NOTE — Telephone Encounter (Signed)
Patient with diagnosis of afib on Eliquis for anticoagulation.    Procedure: OPEN DISTAL PANCREATECTOMY/SPLENECTOMY   Date of procedure: TBD  CHADS2-VASc score of  4 ( HTN, AGE, DM2, CAD)  CrCl 83 ml/min  Per office protocol, patient can hold Eliquis for 2 days prior to procedure.

## 2019-04-17 NOTE — Telephone Encounter (Signed)
   [facility] Pre-operative Risk Assessment    Request for surgical clearance:  1. What type of surgery is being performed? OPEN DISTAL PANCREATECTOMY/SPLENECTOMY    2. When is this surgery scheduled? TBD   3. What type of clearance is required (medical clearance vs. Pharmacy clearance to hold med vs. Both)? BOTH  4. Are there any medications that need to be held prior to surgery and how long? REQUEST STATES PT IS ON BOTH WARFARIN AND ELIQUIS; MAY [place]?  5. Practice name and name of physician performing surgery? [facility]; DR.FAER BYERLY   6. What is your office phone number [phone]    7.   What is your office fax number [phone] ATTN: April Staton, CMA  8.   Anesthesia type (None, local, MAC, general) ? GENERAL   Julaine Hua 04/17/2019, 10:25 AM  _________________________________________________________________   (provider comments below)

## 2019-04-18 ENCOUNTER — Encounter (HOSPITAL_COMMUNITY): Payer: Self-pay | Admitting: Radiology

## 2019-04-18 ENCOUNTER — Other Ambulatory Visit (HOSPITAL_COMMUNITY): Payer: Self-pay | Admitting: General Surgery

## 2019-04-18 DIAGNOSIS — K863 Pseudocyst of pancreas: Secondary | ICD-10-CM

## 2019-04-18 NOTE — Progress Notes (Signed)
Steve Anthony Male, 69 y.o., Oct 24, 1950 MRN:  [id number] Phone:  [phone] [name]) PCP:  [name], MD Coverage:  [facility] Medicare/[employer] Medicare Choice Ppo  RE: STAT: CT Drainage Received: Today Message Contents  Aletta Edouard, MD  [name]  Discussed w/ [name]. He is severely symptomatic and she would like [place] to drain pseudocyst with a perc drain from left lateral approach to give him some symptom relief prior to elective surgery in few months. Sedation case.   GY       Previous Messages   ----- Message -----  From: [name]  Sent: 04/18/2019  8:42 AM EDT  To: Aletta Edouard, MD  Subject: STAT: CT Drainage                Procedure:  CT IMAGE GUIDED DRAINAGE BY PERCUTANEOUS CATHETER   Reason:  Pancreatic pseudocyst   Per order: Provider states that this was discussed with Dr. Kathlene Cote. Temporizing perc drain to get to surgery.   History: CT, [place] in computer   Provider: Stark Klein   Provider Contact:  [phone]

## 2019-04-18 NOTE — Telephone Encounter (Signed)
   Primary Cardiologist: Thompson Grayer, MD  Chart reviewed as part of pre-operative protocol coverage. Given past medical history and time since last visit, based on ACC/AHA guidelines, Steve Anthony would be at acceptable risk for the planned procedure without further cardiovascular testing.   His RCRI is a class III risk, 6.6% risk of major cardiac event.  He is able to perform greater than 4 METS of physical activity.  Patient with diagnosis of afib on Eliquis for anticoagulation.  Procedure: OPEN DISTAL PANCREATECTOMY/SPLENECTOMY  Date of procedure: TBD  CHADS2-VASc score of 4 ( HTN, AGE, DM2, CAD)  CrCl 83 ml/min  Per office protocol, patient can hold Eliquis for 2 days prior to procedure.    I will route this recommendation to the requesting party via Epic fax function and remove from pre-op pool.  Please call with questions.   Jossie Ng. [facility] [place] [address] Office [phone] Fax [phone]

## 2019-04-19 ENCOUNTER — Telehealth: Payer: Self-pay | Admitting: Internal Medicine

## 2019-04-19 NOTE — Telephone Encounter (Signed)
Patient with diagnosis of afib on Eliquis for anticoagulation.    Procedure: CT Image Guided Drainage by Percutaneous Catheter   Date of procedure: TBD  CHADS2-VASc score of 4 ( HTN, AGE, DM2, CAD)   CrCl 83 ml/min   Per office protocol, patient can hold Eliquis for 1-2 days prior to procedure.

## 2019-04-19 NOTE — Telephone Encounter (Signed)
° °  [facility] Pre-operative Risk Assessment    Request for surgical clearance:  1. What type of surgery is being performed? CT Image Guided Drainage by Percutaneous Catheter   2. When is this surgery scheduled? TBD   3. What type of clearance is required (medical clearance vs. Pharmacy clearance to hold med vs. Both)? Medical   4. Are there any medications that need to be held prior to surgery and how long? Eliquis doesn't know how long to be held  5. Practice name and name of physician performing surgery?   6. What is your office phone number [phone]    7.   What is your office fax number [phone]  8.   Anesthesia type (None, local, MAC, general) ? Possible IV sedation    Trilby Drummer 04/19/2019, 2:34 PM  _________________________________________________________________   (provider comments below)

## 2019-04-20 ENCOUNTER — Ambulatory Visit (HOSPITAL_COMMUNITY)
Admission: RE | Admit: 2019-04-20 | Discharge: 2019-04-20 | Disposition: A | Discharge status: Home / Self Care | Payer: Medicare PPO | Source: Ambulatory Visit | Attending: Internal Medicine | Admitting: Internal Medicine

## 2019-04-20 ENCOUNTER — Other Ambulatory Visit: Payer: Self-pay | Admitting: Gastroenterology

## 2019-04-20 ENCOUNTER — Other Ambulatory Visit: Payer: Self-pay | Admitting: Student

## 2019-04-20 NOTE — Progress Notes (Signed)
Pt tested positive for covid-19 on 02/20/19. Pt is within this 90-day, no re-test window and appointment will be cancelled for today. Pt is aware and verbalizes understanding.   Status:  Final result Visible to patient:  No (inaccessible in MyChart) Next appt:  Today at 12:10 PM in No [facility] (MC-SCREENING) Specimen Information: Nasopharyngeal Swab      Ref Range & Units 1 mo ago 9 mo ago  SARS Coronavirus 2 NEGATIVE POSITIVEAbnormal   NEGATIVE CM   Comment: RESULT CALLED TO, READ BACK BY AND VERIFIED WITH:  C ZULETA,RN 1809 02/20/2019 D BRADLEY  (NOTE)  SARS-CoV-2 target nucleic acids are DETECTED.  The SARS-CoV-2 RNA is generally detectable in upper and lower  respiratory specimens during the acute phase of infection. Positive  results are indicative of the presence of SARS-CoV-2 RNA. Clinical  correlation with patient history and other diagnostic information is  necessary to determine patient infection status. Positive results do  not rule out bacterial infection or co-infection with other viruses.  The expected result is Negative.  Fact Sheet for Patients:  SugarRoll.be  Fact Sheet for Healthcare Providers:  https://www.woods-mathews.com/  This test is not yet approved or cleared by the [place] FDA and  has been authorized for detection and/or diagnosis of SARS-CoV-2 by  FDA under an Emergency Use Authorization (EUA). This EUA will remain  in effect (meaning this test can be used) for the duration of the  COVID-19 declaration under Section 564(b)(1) of the Act, 21 U.S.C.  section 360bbb-3(b)(1), unless the authorization is terminated or  revoked sooner.  Performed at [facility], [place] [address], [place], [place]  [address]   Resulting Agency  [facility]      Specimen Collected: 02/20/19 13:06 Last Resulted: 02/20/19 18:10          .Jacqlyn Larsen, RN

## 2019-04-20 NOTE — Telephone Encounter (Signed)
   Primary Cardiologist: Thompson Grayer, MD  Chart reviewed as part of pre-operative protocol coverage. Patient was contacted 04/20/2019 in reference to pre-operative risk assessment for pending surgery as outlined below.  Steve Anthony was last seen on 01/10/19 by Dr. Rayann Heman.  Since that day, Steve Anthony has done well with no new cardiac complaints. He is active with his 68 year old grandson with no exertional symptoms.  Therefore, based on ACC/AHA guidelines, the patient would be at acceptable risk for the planned procedure without further cardiovascular testing.   Per our pharmacy protocol: Patient with diagnosis of afib on Eliquis for anticoagulation.   Procedure: CT Image Guided Drainage by Percutaneous Catheter Date of procedure: TBD CHADS2-VASc score of 4 ( HTN, AGE, DM2, CAD)  CrCl 83 ml/min   Per office protocol, patient can hold Eliquis for 1-2 days prior to procedure.     I will route this recommendation to the requesting party via Epic fax function and remove from pre-op pool.  Please call with questions.  Daune Perch, NP 04/20/2019, 9:48 AM

## 2019-04-23 ENCOUNTER — Other Ambulatory Visit: Payer: Self-pay

## 2019-04-23 ENCOUNTER — Ambulatory Visit (HOSPITAL_COMMUNITY)
Admission: RE | Admit: 2019-04-23 | Discharge: 2019-04-23 | Disposition: A | Discharge status: Home / Self Care | Payer: Medicare PPO | Source: Ambulatory Visit | Attending: General Surgery | Admitting: General Surgery

## 2019-04-23 ENCOUNTER — Encounter (HOSPITAL_COMMUNITY): Payer: Self-pay | Admitting: Radiology

## 2019-04-23 DIAGNOSIS — K863 Pseudocyst of pancreas: Secondary | ICD-10-CM | POA: Diagnosis not present

## 2019-04-23 DIAGNOSIS — E785 Hyperlipidemia, unspecified: Secondary | ICD-10-CM | POA: Diagnosis not present

## 2019-04-23 DIAGNOSIS — I480 Paroxysmal atrial fibrillation: Secondary | ICD-10-CM | POA: Diagnosis not present

## 2019-04-23 DIAGNOSIS — J449 Chronic obstructive pulmonary disease, unspecified: Secondary | ICD-10-CM | POA: Insufficient documentation

## 2019-04-23 DIAGNOSIS — Z7901 Long term (current) use of anticoagulants: Secondary | ICD-10-CM | POA: Insufficient documentation

## 2019-04-23 DIAGNOSIS — Z7984 Long term (current) use of oral hypoglycemic drugs: Secondary | ICD-10-CM | POA: Insufficient documentation

## 2019-04-23 DIAGNOSIS — Z4682 Encounter for fitting and adjustment of non-vascular catheter: Secondary | ICD-10-CM | POA: Insufficient documentation

## 2019-04-23 DIAGNOSIS — Z8719 Personal history of other diseases of the digestive system: Secondary | ICD-10-CM | POA: Insufficient documentation

## 2019-04-23 DIAGNOSIS — Z8616 Personal history of COVID-19: Secondary | ICD-10-CM | POA: Insufficient documentation

## 2019-04-23 DIAGNOSIS — Z79899 Other long term (current) drug therapy: Secondary | ICD-10-CM | POA: Insufficient documentation

## 2019-04-23 DIAGNOSIS — K219 Gastro-esophageal reflux disease without esophagitis: Secondary | ICD-10-CM | POA: Diagnosis not present

## 2019-04-23 DIAGNOSIS — G4733 Obstructive sleep apnea (adult) (pediatric): Secondary | ICD-10-CM | POA: Insufficient documentation

## 2019-04-23 DIAGNOSIS — M47896 Other spondylosis, lumbar region: Secondary | ICD-10-CM | POA: Insufficient documentation

## 2019-04-23 DIAGNOSIS — Z87891 Personal history of nicotine dependence: Secondary | ICD-10-CM | POA: Diagnosis not present

## 2019-04-23 DIAGNOSIS — Z9981 Dependence on supplemental oxygen: Secondary | ICD-10-CM | POA: Insufficient documentation

## 2019-04-23 DIAGNOSIS — I10 Essential (primary) hypertension: Secondary | ICD-10-CM | POA: Insufficient documentation

## 2019-04-23 DIAGNOSIS — E118 Type 2 diabetes mellitus with unspecified complications: Secondary | ICD-10-CM | POA: Diagnosis not present

## 2019-04-23 DIAGNOSIS — E039 Hypothyroidism, unspecified: Secondary | ICD-10-CM | POA: Diagnosis not present

## 2019-04-23 DIAGNOSIS — I471 Supraventricular tachycardia: Secondary | ICD-10-CM | POA: Diagnosis not present

## 2019-04-23 DIAGNOSIS — I2510 Atherosclerotic heart disease of native coronary artery without angina pectoris: Secondary | ICD-10-CM | POA: Insufficient documentation

## 2019-04-23 LAB — PROTIME-INR
INR: 1.4 — ABNORMAL HIGH (ref 0.8–1.2)
Prothrombin Time: 17.3 seconds — ABNORMAL HIGH (ref 11.4–15.2)

## 2019-04-23 LAB — CBC
HCT: 32.3 % — ABNORMAL LOW (ref 39.0–52.0)
Hemoglobin: 10.1 g/dL — ABNORMAL LOW (ref 13.0–17.0)
MCH: 26.3 pg (ref 26.0–34.0)
MCHC: 31.3 g/dL (ref 30.0–36.0)
MCV: 84.1 fL (ref 80.0–100.0)
Platelets: 94 10*3/uL — ABNORMAL LOW (ref 150–400)
RBC: 3.84 MIL/uL — ABNORMAL LOW (ref 4.22–5.81)
RDW: 16.2 % — ABNORMAL HIGH (ref 11.5–15.5)
WBC: 2.6 10*3/uL — ABNORMAL LOW (ref 4.0–10.5)
nRBC: 0 % (ref 0.0–0.2)

## 2019-04-23 LAB — GLUCOSE, CAPILLARY
Glucose-Capillary: 120 mg/dL — ABNORMAL HIGH (ref 70–99)
Glucose-Capillary: 108 mg/dL — ABNORMAL HIGH (ref 70–99)

## 2019-04-23 MED ORDER — SODIUM CHLORIDE 0.9% FLUSH: 5.0000 mL | Freq: Three times a day (TID) | INTRAVENOUS | Status: DC

## 2019-04-23 MED ORDER — FENTANYL CITRATE (PF) 100 MCG/2ML IJ SOLN
INTRAMUSCULAR | Status: AC
  Filled 2019-04-23: qty 2

## 2019-04-23 MED ORDER — MIDAZOLAM HCL 2 MG/2ML IJ SOLN
INTRAMUSCULAR | Status: AC
  Filled 2019-04-23: qty 2

## 2019-04-23 MED ORDER — HYDROCODONE-ACETAMINOPHEN 5-325 MG PO TABS
1.0000 | ORAL_TABLET | ORAL | Status: DC | PRN
  Administered 2019-04-23: 2 via ORAL
  Filled 2019-04-23: qty 2

## 2019-04-23 MED ORDER — VANCOMYCIN HCL IN DEXTROSE 1-5 GM/200ML-% IV SOLN
INTRAVENOUS | Status: AC
  Administered 2019-04-23: 1000 mg via INTRAVENOUS
  Filled 2019-04-23: qty 200

## 2019-04-23 MED ORDER — MIDAZOLAM HCL 2 MG/2ML IJ SOLN
INTRAMUSCULAR | Status: AC | PRN
  Administered 2019-04-23 (×3): 0.5 mg via INTRAVENOUS

## 2019-04-23 MED ORDER — VANCOMYCIN HCL IN DEXTROSE 1-5 GM/200ML-% IV SOLN
1000.0000 mg | Freq: Once | INTRAVENOUS | Status: AC
  Filled 2019-04-23: qty 200

## 2019-04-23 MED ORDER — HEPARIN SOD (PORK) LOCK FLUSH 100 UNIT/ML IV SOLN
250.0000 [IU] | INTRAVENOUS | Status: AC | PRN
  Administered 2019-04-23: 250 [IU]
  Filled 2019-04-23: qty 3

## 2019-04-23 MED ORDER — SODIUM CHLORIDE 0.9 % IV SOLN: INTRAVENOUS | Status: DC

## 2019-04-23 MED ORDER — FENTANYL CITRATE (PF) 100 MCG/2ML IJ SOLN
INTRAMUSCULAR | Status: AC | PRN
  Administered 2019-04-23: 25 ug via INTRAVENOUS
  Administered 2019-04-23: 50 ug via INTRAVENOUS

## 2019-04-23 NOTE — Progress Notes (Signed)
Steve Anthony Male, 69 y.o., 1950/06/26 MRN:  [id number] Phone:  [phone] [name]) PCP:  [name], MD Coverage:  [facility] Medicare/[employer] Medicare Choice Ppo Next Appt With Radiology (MC-CT 3) 04/23/2019 at 11:00 AM  RE: STAT: CT Drainage Received: 2 days ago Message Contents  Thompson Grayer, MD  [name]  Ok to hold for the procedure.       Previous Messages   ----- Message -----  From: [name]  Sent: 04/18/2019  8:47 AM EDT  To: Thompson Grayer, MD  Subject: FW: STAT: CT Drainage              Dr. Rayann Heman there is an order placed for a biopsy to be done urgently on [name]. Patient is on Eliquis and we will need to know if it will be okay to hold it for two days prior to his biopsy.  Thanks [name]  ----- Message -----  From: [name]  Sent: 04/18/2019  8:42 AM EDT  To: Aletta Edouard, MD  Subject: STAT: CT Drainage                Procedure:  CT IMAGE GUIDED DRAINAGE BY PERCUTANEOUS CATHETER   Reason:  Pancreatic pseudocyst   Per order: Provider states that this was discussed with Dr. Kathlene Cote. Temporizing perc drain to get to surgery.   History: CT, [place] in computer   Provider: Stark Klein   Provider Contact:  [phone]

## 2019-04-23 NOTE — Procedures (Signed)
  Procedure: CT drain into panc pseudocyst [id number]:   minimal Complications:  none immediate  See full dictation in [facility]  DDillard CannonMD Main # [phone]  [phone]

## 2019-04-23 NOTE — H&P (Signed)
Chief Complaint: Patient was seen in consultation today for pancreatic pseudocyst drain.  Referring Physician(s): XNATFT,DDUKG  Supervising Physician: Arne Cleveland  Patient Status: [facility] - Out-pt  History of Present Illness: Steve Anthony is a 69 y.o. male with a past medical history significant for DJD, OSA, COPD on supplemental oxygen at night, GERD, diverticulitis, paroxysmal a.fib on Eliquis, SVT, HTN, hyperthyroidism, DM, COVID-19 infection and pancreatitis who presents to IR for placement of a pancreatic pseudocyst drain. [name] initially presented to the [facility] ED on 03/06/19 with complaints worsening abdominal pain after being discharged from the hospital approximately 10 days prior for pancreatitis. He was seen by GI as an outpatient earlier that day and was referred to the ED due concern for recurrent pancreatitis. Initial work up notable for interval increase in size of a large pancreatic tail pseudocyst with mildly increased surrounding soft tissue stranding and inferior wall thickening, compatible with interval inflammatory/infectious changes involving the inferior portion of the cyst and an adjacent 1.6 cm component. He was admitted and underwent CT guided aspiration of the pseudocyst in IR on 03/08/19 which noted 200 mL thick brown opaque fluid. Aspirate of fluid showed enterococcus faecalis, amylase 5,203. He was discharged on 03/11/19 with plan to continue outpatient IV antibiotics. He is planned to undergo surgery with Dr. [name] and IR has been asked to place a drain within the pseudocyst as a temporizing measure prior to surgical intervention.  Patient reports he continues to have significant abdominal pain that is not really helped by anything. He is hungry but vomits nearly every time he eats anything solid, he is tolerating liquids only currently. He is passing several loose non-bloody stools daily. He has finished his IV antibiotics and is currently only flushing the PICC  line with saline/heparin, he is not receiving TPN currently. He states he is expected to have surgery 5/13 "where they're going to put the drain in my stomach." He states understanding of today's procedure and is agreeable to proceed.   Past Medical History:  Diagnosis Date  . Arthritis   . CAD (coronary artery disease)    a. S/P PCI mid LAD (vision stent) 08/31/2004;  b. repeat cath 6/11 - no progression of CAD. c. Cath 10/2012 given moderate CAD on cardiac CT - no change from prior cath.  . Chest pain 12/16/2016  . COPD (chronic obstructive pulmonary disease) ([place])   . COVID-19 virus infection   . Diverticulitis   . DJD (degenerative joint disease) of lumbar spine   . Dyslipidemia   . Gallstones   . GERD (gastroesophageal reflux disease)   . History of kidney stones   . Hypertension   . Hyperthyroidism   . Melanoma ([place])    melanoma removed from neck   . On home oxygen therapy    "2L q hs" (11/08/2012)  . OSA (obstructive sleep apnea)    a. cpap noncompliance- patient reports on 12/10/14- does not use CPAP  . Palpitations    a. 10/2012 - s/p LINQ loop recorder to assess for arrhythmia.   . Pancreatitis   . Paroxysmal A-fib ([place])    a. S/P PVI 2/11 and 9/11  . SVT (supraventricular tachycardia) ([place])    a. Atypical AVNRT of the slow pathway  . Type II diabetes mellitus ([place])     Past Surgical History:  Procedure Laterality Date  . ATRIAL ABLATION SURGERY  2007   S/P slow pathway ablation for atypical AVNRT   . BIOPSY  07/17/2018   Procedure:  BIOPSY;  Surgeon: Irving Copas., MD;  Location: [place];  Service: Gastroenterology;;  . [facility]in [facility]  ~ 2008  . COLON RESECTION  02/2013   . COLONOSCOPY  12/25/2007   Colonic polyp status post polypectomy. Internal hemorrhoids.   . CORONARY ANGIOPLASTY WITH STENT PLACEMENT  08/30/2004   Vision; to LAD   . CYSTOSCOPY W/ STONE MANIPULATION  ~ 1998   "once" (11/08/2012)  . EP Study  2008   Negative EP study in  [place]  . ESOPHAGOGASTRODUODENOSCOPY (EGD) WITH PROPOFOL N/A 07/17/2018   Procedure: ESOPHAGOGASTRODUODENOSCOPY (EGD) WITH PROPOFOL;  Surgeon: Rush Landmark Telford Nab., MD;  Location: [place];  Service: Gastroenterology;  Laterality: N/A;  . FRACTURE SURGERY    . LEFT HEART CATHETERIZATION WITH CORONARY ANGIOGRAM N/A 11/10/2012   Procedure: LEFT HEART CATHETERIZATION WITH CORONARY ANGIOGRAM;  Surgeon: Burnell Blanks, MD;  Location: [facility] CATH LAB;  Service: Cardiovascular;  Laterality: N/A;  . LOOP RECORDER IMPLANT N/A 11/10/2012   Medtronic LinQ implanted by Dr Rayann Heman for afib management  . [place] SURGERY  2000; 2002  . POSTERIOR LUMBAR FUSION  2004  . PROSTATE SURGERY  ~ 2009  . PVI/ CTI ablation  02/25/2009 and 09/2009   S/P afib/ CTI ablation  . TIBIA FRACTURE SURGERY Right 1990   "broke in 3 places; had rod put in" (11/08/2012)  . TIBIA HARDWARE REMOVAL Right ~ 1991  . TONSILLECTOMY  1965  . TOTAL HIP ARTHROPLASTY Right 12/18/2014   Procedure: RIGHT TOTAL HIP ARTHROPLASTY ANTERIOR APPROACH;  Surgeon: Gaynelle Arabian, MD;  Location: [facility];  Service: Orthopedics;  Laterality: Right;  . UPPER ESOPHAGEAL ENDOSCOPIC ULTRASOUND (EUS) N/A 07/17/2018   Procedure: UPPER ESOPHAGEAL ENDOSCOPIC ULTRASOUND (EUS);  Surgeon: Irving Copas., MD;  Location: [place];  Service: Gastroenterology;  Laterality: N/A;    Allergies: Penicillins  Medications: Prior to Admission medications   Medication Sig Start Date End Date Taking? Authorizing Provider  acetaminophen (TYLENOL) 325 MG tablet Take by mouth every 6 (six) hours as needed for headache.   Yes [provider]  Cyanocobalamin (VITAMIN B-12 IJ) Inject 1,000 mcg as directed every 30 (thirty) days.   Yes [provider]  escitalopram (LEXAPRO) 10 MG tablet Take 10 mg by mouth daily. 05/30/17  Yes [provider]  fenofibrate 160 MG tablet Take 160 mg by mouth daily.     Yes [provider]   gabapentin (NEURONTIN) 300 MG capsule Take 300 mg by mouth 2 (two) times daily.    Yes [provider]  lipase/protease/amylase (CREON) 36000 UNITS CPEP capsule Take one with each meal. 04/02/19  Yes Jackquline Denmark, MD  Melatonin 10 MG TABS Take 10 mg by mouth at bedtime.    Yes [provider]  metFORMIN (GLUCOPHAGE) 500 MG tablet Take 500 mg by mouth 2 (two) times daily.   Yes [provider]  metoprolol succinate (TOPROL-XL) 50 MG 24 hr tablet TAKE 1 TABLET BY MOUTH TWICE A DAY Patient taking differently: Take 50 mg by mouth 2 (two) times daily.    Yes Thompson Grayer, MD  oxyCODONE-acetaminophen (PERCOCET) 10-325 MG tablet Take 1 tablet by mouth every 4 (four) hours as needed for pain.    Yes [provider]  pantoprazole (PROTONIX) 40 MG tablet TAKE 1 TABLET BY MOUTH TWICE A DAY 04/20/19  Yes Mansouraty, Telford Nab., MD  Tamsulosin HCl (FLOMAX) 0.4 MG CAPS Take 0.4 mg by mouth daily.     Yes [provider]  tiZANidine (ZANAFLEX) 4 MG tablet  Take 4 mg by mouth 2 (two) times daily. In the evening 03/01/18  Yes [provider]  traZODone (DESYREL) 50 MG tablet Take 100 mg by mouth at bedtime.  08/17/17  Yes [provider]  albuterol (VENTOLIN HFA) 108 (90 Base) MCG/ACT inhaler Inhale 2 puffs into the lungs every 4 (four) hours as needed for wheezing or shortness of breath.  01/28/19   [provider]  docusate sodium (COLACE) 100 MG capsule Take 100 mg by mouth See admin instructions. Three times a week    [provider]  ELIQUIS 5 MG TABS tablet TAKE 1 TABLET BY MOUTH TWICE A DAY Patient taking differently: Take 5 mg by mouth 2 (two) times daily.  12/25/18   Allred, Jeneen Rinks, MD  ferrous sulfate 325 (65 FE) MG tablet Take 325 mg by mouth See admin instructions. Take 1 tablet 2 times a week    [provider]  naloxone (NARCAN) 4 MG/0.1ML LIQD nasal spray kit Place 1 spray into the nose daily as needed (opioid  overdose).    [provider]  nitroGLYCERIN (NITROSTAT) 0.4 MG SL tablet Place 1 tablet (0.4 mg total) under the tongue every 5 (five) minutes x 3 doses as needed for chest pain. 12/17/16   Barrett, Evelene Croon, PA-C  ondansetron (ZOFRAN ODT) 4 MG disintegrating tablet Take 1 tablet (4 mg total) by mouth every 6 (six) hours as needed for nausea or vomiting. Place under tongue 03/20/19   Kennedy-Smith, Patrecia Pour, NP  OVER THE COUNTER MEDICATION Apply 1 application topically daily as needed (rash). OTC fanny cream    [provider]  OXYGEN Inhale 2 L into the lungs daily as needed (shortness of breath).     [provider]  promethazine (PHENERGAN) 25 MG tablet Take 1 tablet (25 mg total) by mouth every 8 (eight) hours as needed for nausea or vomiting. 09/25/18   Jackquline Denmark, MD     Family History  Problem Relation Age of Onset  . Emphysema Mother   . Heart attack Father        CVA  . Hypertension Father   . Diabetes Father   . Heart disease Father        MI  . Emphysema Maternal Grandfather        Smoker  . Heart disease Paternal Grandfather   . Colon cancer Neg Hx   . Esophageal cancer Neg Hx   . Inflammatory bowel disease Neg Hx   . Liver disease Neg Hx   . Pancreatic cancer Neg Hx   . Rectal cancer Neg Hx   . Stomach cancer Neg Hx     Social History   Socioeconomic History  . Marital status: Married    Spouse name: Not on file  . Number of children: 1  . Years of education: Not on file  . Highest education level: Not on file  Occupational History  . Occupation: DISABLED    Employer: DISABLED  . 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 Smoking status: Former Smoker    Packs/day: 3.00    Years: 38.00    Pack years: 114.00    Types: Cigarettes    Quit date: 03/25/2004    Years since quitting: 15.0  . Smokeless tobacco: Former Systems developer    Types: [place] date: 04/25/2004  Substance and Sexual Activity  . Alcohol use: Not Currently      Comment:  "quit drinking in 1989"  .  Drug use: No  . Sexual activity: Not Currently  Other Topics Concern  . Not on file  Social History Narrative   Resides in [place] with his wife   Two children and two grandchildren   Attends [employer]   Social Determinants of Health   Financial Resource Strain:   . Difficulty of Paying Living Expenses:   Food Insecurity:   . Worried About Charity fundraiser in the Last Year:   . Arboriculturist in the Last Year:   Transportation Needs:   . Film/video editor (Medical):   Marland Kitchen Lack of Transportation (Non-Medical):   Physical Activity:   . Days of Exercise per Week:   . Minutes of Exercise per Session:   Stress:   . Feeling of Stress :   Social Connections:   . Frequency of Communication with Friends and Family:   . Frequency of Social Gatherings with Friends and Family:   . Attends Religious Services:   . Active Member of Clubs or Organizations:   . Attends Archivist Meetings:   Marland Kitchen Marital Status:      Review of Systems: A 12 point ROS discussed and pertinent positives are indicated in the HPI above.  All other systems are negative.  Review of Systems  Constitutional: Positive for fatigue. Negative for chills and fever.  Respiratory: Negative for cough and shortness of breath.   Cardiovascular: Negative for chest pain.  Gastrointestinal: Positive for abdominal pain, nausea and vomiting. Negative for blood in stool and diarrhea.  Musculoskeletal: Negative for back pain.  Skin: Negative for color change.  Neurological: Negative for dizziness and headaches.    Vital Signs: BP 100/78   Pulse 76   Temp (!) 97.4 F (36.3 C) (Skin)   Resp 17   Ht 6' (1.829 m)   Wt 208 lb (94.3 kg)   SpO2 100%   BMI 28.21 kg/m   Physical Exam Vitals reviewed.  Constitutional:      General: He is not in acute distress.    Appearance: He is ill-appearing.  HENT:     Head: Normocephalic.     Mouth/Throat:      Mouth: Mucous membranes are moist.     Pharynx: Oropharynx is clear. No oropharyngeal exudate or posterior oropharyngeal erythema.     Comments: (+) full upper and lower dentures Eyes:     General: No scleral icterus. Cardiovascular:     Rate and Rhythm: Normal rate and regular rhythm.  Pulmonary:     Effort: Pulmonary effort is normal.     Breath sounds: Normal breath sounds.  Abdominal:     General: There is no distension.     Palpations: Abdomen is soft.     Tenderness: There is abdominal tenderness (epigastric).  Skin:    General: Skin is warm and dry.     Coloration: Skin is not jaundiced.  Neurological:     Mental Status: He is alert and oriented to person, place, and time.  Psychiatric:        Mood and Affect: Mood normal.        Behavior: Behavior normal.        Thought Content: Thought content normal.        Judgment: Judgment normal.      MD Evaluation Airway: WNL Heart: WNL Abdomen: WNL Chest/ Lungs: WNL ASA  Classification: 3 Mallampati/Airway Score: Two(Full upper and lower dentures)   Imaging: CT ABDOMEN W CONTRAST  Result Date: 03/29/2019  CLINICAL DATA:  Follow-up pancreatic pseudocysts.  Pancreatitis. EXAM: CT ABDOMEN WITH CONTRAST TECHNIQUE: Multidetector CT imaging of the abdomen was performed using the standard protocol following bolus administration of intravenous contrast. CONTRAST:  111m OMNIPAQUE IOHEXOL 300 MG/ML  SOLN COMPARISON:  None. FINDINGS: Lower chest: Lung bases are clear. Hepatobiliary: No focal hepatic lesion. No biliary duct dilatation. Gallbladder is normal. Common bile duct is normal. Pancreas: Large organized fluid collection of the tail the pancreas measures 12.5 x 9.0 cm compared to 12.0 by 8.7 cm on prior for no significant oval change. Fluid collection extends to the splenic hilum. The pancreatic parenchyma in the body of the pancreas is poorly defined but does enhance. Head of the pancreas appears normal. Small periampullary  diverticulum. No pancreatic duct dilatation. No vascular complication associated with the large pseudocyst. The splenic artery courses along the pseudocysts. Spleen: Spleen is enlarged. New wedge-shaped peripheral perfusion defect in the spleen measuring 1.9 x 2.8 cm likely represents a small peripheral splenic infarction. Extensive venous collateralization in the splenic hilum. Adrenals/urinary tract: Adrenal glands and kidneys are normal. Stable simple fluid sent attenuation cysts within kidneys. Ureters proximally are normal. Stomach/Bowel: Stomach is normal. Duodenum and proximal small bowel normal. Appendix normal. Limited view of the colon is unremarkable. Vascular/Lymphatic: Abdominal aorta normal caliber. Common origin of the celiac and SMA extensive venous collaterals in the ventral peritoneal space recanalization umbilical vein. Other: No free fluid. Musculoskeletal: No aggressive osseous lesion. IMPRESSION: 1. Stable large pseudocyst in the tail of the pancreas. No interval change. 2. Portal hypertension with extensive venous collateralization. 3. New small splenic infarction. Electronically Signed   By: SSuzy BouchardM.D.   On: 03/29/2019 10:24    Labs:  CBC: Recent Labs    03/08/19 0426 03/08/19 0426 03/09/19 0431 03/09/19 1505 03/10/19 0455 03/11/19 0538  WBC 3.3*  --  3.2*  --  3.0* 3.2*  HGB 11.0*   < > 9.8* 10.1* 10.2* 10.4*  HCT 35.9*   < > 32.3* 32.8* 33.1* 33.2*  PLT 106*  --  104*  --  91* 112*   < > = values in this interval not displayed.    COAGS: Recent Labs    07/14/18 0934 03/07/19 1118 03/08/19 0426  INR 1.8* 1.6* 1.6*    BMP: Recent Labs    03/08/19 0426 03/09/19 0431 03/10/19 0455 03/11/19 0538  NA 139 137 136 139  K 4.0 3.8 3.8 4.0  CL 104 103 101 103  CO2 27 27 27 26   GLUCOSE 126* 127* 168* 164*  BUN 10 10 8  6*  CALCIUM 8.5* 8.4* 8.4* 8.5*  CREATININE 1.06 1.06 1.11 1.05  GFRNONAA >60 >60 >60 >60  GFRAA >60 >60 >60 >60    LIVER  FUNCTION TESTS: Recent Labs    03/08/19 0426 03/09/19 0431 03/10/19 0455 03/11/19 0538  BILITOT 0.8 0.7 0.6 0.4  AST 12* 11* 10* 11*  ALT 12 9 9 8   ALKPHOS 61 56 51 49  PROT 6.0* 5.4* 5.3* 5.6*  ALBUMIN 2.7* 2.4* 2.5* 2.6*    TUMOR MARKERS: No results for input(s): AFPTM, CEA, CA199, CHROMGRNA in the last 8760 hours.  Assessment and Plan:  69y/o M with recurrent pancreatitis and pancreatic pseudocyst planned for surgical intervention who presents today IR today for drain placement within the pancreatic pseudocyst as a temporizing measure prior to surgery per request of Dr. BBarry Dienes  Patient has been NPO since 9 pm lat night, last dose of Eliquis Friday (3/26). Afebrile, CBC  pending and will be reviewed prior to proceeding, INR 1.4.   Risks and benefits discussed with the patient including bleeding, infection, damage to adjacent structures, bowel perforation/fistula connection, and sepsis.  All of the patient's questions were answered, patient is agreeable to proceed.  Consent signed and in chart.  Thank you for this interesting consult.  I greatly enjoyed meeting BRAYDYN SCHULTES and look forward to participating in their care.  A copy of this report was sent to the requesting provider on this date.  Electronically Signed: Joaquim Nam, PA-C 04/23/2019, 10:28 AM   I spent a total of  30 Minutes   in face to face in clinical consultation, greater than 50% of which was counseling/coordinating care for pancreatic pseudocyst drain placement.

## 2019-04-23 NOTE — Progress Notes (Signed)
Pt has picc line noted to right upper arm. Drue Novel, PA called and informed states ok to use picc line

## 2019-04-24 ENCOUNTER — Telehealth: Payer: Self-pay | Admitting: Physician Assistant

## 2019-04-24 NOTE — Discharge Instructions (Signed)
Percutaneous Abscess Drain, Care After This sheet gives you information about how to care for yourself after your procedure. Your health care provider may also give you more specific instructions. If you have problems or questions, contact your health care provider. What can I expect after the procedure? After your procedure, it is common to have:  A small amount of bruising and discomfort in the area where the drainage tube (catheter) was placed.  Sleepiness and fatigue. This should go away after the medicines you were given have worn off. Follow these instructions at home: Incision care  Follow instructions from your health care provider about how to take care of your incision. Make sure you: ? Wash your hands with soap and water before you change your bandage (dressing). If soap and water are not available, use hand sanitizer. ? Change your dressing as told by your health care provider. ? Leave stitches (sutures), skin glue, or adhesive strips in place. These skin closures may need to stay in place for 2 weeks or longer. If adhesive strip edges start to loosen and curl up, you may trim the loose edges. Do not remove adhesive strips completely unless your health care provider tells you to do that.  Check your incision area every day for signs of infection. Check for: ? More redness, swelling, or pain. ? More fluid or blood. ? Warmth. ? Pus or a bad smell. ? Fluid leaking from around your catheter (instead of fluid draining through your catheter). Catheter care   Follow instructions from your health care provider about emptying and cleaning your catheter and collection bag. You may need to clean the catheter every day so it does not clog.  If directed, write down the following information every time you empty your bag: ? The date and time. ? The amount of drainage. General instructions  Rest at home for 1-2 days after your procedure. Return to your normal activities as told by your  health care provider.  Do not take baths, swim, or use a hot tub for 24 hours after your procedure, or until your health care provider says that this is okay.  Take over-the-counter and prescription medicines only as told by your health care provider.  Keep all follow-up visits as told by your health care provider. This is important. Contact a health care provider if:  You have less than 10 mL of drainage a day for 2-3 days in a row, or as directed by your health care provider.  You have more redness, swelling, or pain around your incision area.  You have more fluid or blood coming from your incision area.  Your incision area feels warm to the touch.  You have pus or a bad smell coming from your incision area.  You have fluid leaking from around your catheter (instead of through your catheter).  You have a fever or chills.  You have pain that does not get better with medicine. Get help right away if:  Your catheter comes out.  You suddenly stop having drainage from your catheter.  You suddenly have blood in the fluid that is draining from your catheter.  You become dizzy or you faint.  You develop a rash.  You have nausea or vomiting.  You have difficulty breathing or you feel short of breath.  You develop chest pain.  You have problems with your speech or vision.  You have trouble balancing or moving your arms or legs. Summary  It is common to have a small  amount of bruising and discomfort in the area where the drainage tube (catheter) was placed.  You may be directed to record the amount of drainage from the bag every time you empty it.  Follow instructions from your health care provider about emptying and cleaning your catheter and collection bag. This information is not intended to replace advice given to you by your health care provider. Make sure you discuss any questions you have with your health care provider. Document Revised: 12/24/2016 Document  Reviewed: 12/04/2015 Elsevier Patient Education  Balta this sheet to all of your post-operative appointments while you have your drain.  Please measure your drains by ML's.  At the end of each day, add up totals     ( 9 am )     ( 3 pm )        ( 9 pm )                Date             Total                                                                                                                                                                                        Activity Instructions   You must avoid lifting more than *** pounds until your physician instructs you differently. You should avoid {d/c avoid/resume:120111}. You may resume {d/c avoid/resume:120111}.   I understand that if any problems occur once I am at home I am to contact my physician.  I understand and acknowledge receipt of the instructions indicated above.    _____________________________________________                                                       Physician's or R.N.'s Signature                Date/Time                        _____________________________________________  Patient or Representative Signature         Date/Time        Personal Items   Please collect all clothing which belongs to you from your nurse. Please collect any valuables you stored during your stay from the front desk, and please remember all of your personal items, such as dentures, canes, and eyeglasses.   Scheduled Meds: Continuous Infusions: PRN Meds:  Patient Discharge   Steve Anthony / [id number] DOB: [birth year]   Admitted 04/23/2019 Discharged: 04/24/2019

## 2019-04-24 NOTE — Telephone Encounter (Signed)
Request received to review drain care instructions with patient's family, daughter April is an Therapist, sports and was unable to be present yesterday at discharge. Patient and wife acknowledge drain care was reviewed, including teach back, yesterday by short stay RN however upon attempting drain care this morning they realized they had further questions. They do have a HHN who manages his PICC line on Wednesdays but state that [facility] was unfamiliar with drain and it's care.  Discussed drain care with patient, his wife and daughter April today via phone - verbal instructions given on how to disconnect tubing, connecting flush, pushing normal saline, reconnecting gravity bag tubing, recording output and dressing changes. Offered for patient to return to hospital for in person demonstration if needed, however all parties stated understanding of the above and are comfortable with drain care moving forward.   Encouraged patient and family to call IR with any further questions or concerns regarding drain care/output.  Candiss Norse, PA-C 04/24/19 1034

## 2019-04-26 NOTE — Progress Notes (Signed)
Discharge instructions given to wife Vaughan Basta over the phone and with patient. Verbalized understanding. RN printed a chart for pt to use to record output. Pt stated he had gauze and tape at home. Flushes given. Pt emptied drain himself prior to D/C.

## 2019-04-28 LAB — AEROBIC/ANAEROBIC CULTURE W GRAM STAIN (SURGICAL/DEEP WOUND): Gram Stain: NONE SEEN

## 2019-04-30 ENCOUNTER — Telehealth: Payer: Self-pay | Admitting: Gastroenterology

## 2019-04-30 NOTE — Telephone Encounter (Signed)
Dr Lyndel Safe the pt's daughter would like you to call her to discuss her father when you are back in the office.  She has concerns about timing of surgery and his weight loss.  Please call April at [phone].

## 2019-04-30 NOTE — Telephone Encounter (Signed)
Pt's daughter reported that pt has lost 100 lbs due to loss of appetite.  Pt requested a call back to discuss upcoming surgery.

## 2019-04-30 NOTE — Telephone Encounter (Signed)
Left message on machine to call back  

## 2019-05-08 ENCOUNTER — Other Ambulatory Visit: Payer: Self-pay | Admitting: General Surgery

## 2019-06-01 ENCOUNTER — Telehealth: Payer: Self-pay | Admitting: Internal Medicine

## 2019-06-01 NOTE — Telephone Encounter (Signed)
Appt made for Pt to see EP APP for blood pressure review.  Scheduler to call Pt.

## 2019-06-01 NOTE — Telephone Encounter (Signed)
  Pt c/o BP issue: STAT if pt c/o blurred vision, one-sided weakness or slurred speech  1. What are your last 5 BP readings?  Pt has a record but he is not at home.   107/88  HR 86 this morning and he did take his BP pill  2. Are you having any other symptoms (ex. Dizziness, headache, blurred vision, passed out)? No   3. What is your BP issue? The patient does not take his metoprolol succinate (TOPROL-XL) 50 MG 24 hr tablet twice daily. He was told by Dr. Bea Graff to only take the medication if his BP is over 100. He was told this because when the patient does take the medicine his BP goes down around 90/65 but his HR will be high. When he doesn't  take the medication his BP goes high and   The patient is supposed to have surgery to remove his pancreas. The patient's daughter is concerned that the surgery would be postponed if the patient can not get his BP under control

## 2019-06-01 NOTE — Pre-Procedure Instructions (Signed)
[id number]: [phone]: [phone]    Your procedure is scheduled on Thursday, May 13, from 07:30 AM- 12:30 PM.  [facility] at 05:30 A.M., and check in at the Admitting office.  Call this number if you have problems the morning of surgery:  [phone] Call [phone] you have any questions prior to your surgery date Monday-Friday 8am-4pm.    Remember:  Do not eat after midnight the night before your surgery.  You may drink clear liquids until 04:30 AM the morning of your surgery.    Clear liquids allowed are: Water, Non-Citrus Juices (without pulp), Carbonated Beverages, Clear Tea, Black Coffee Only, and Gatorade.    Take these medicines the morning of surgery with A SIP OF WATER : escitalopram (LEXAPRO) Fenofibrate gabapentin (NEURONTIN) metoprolol succinate (TOPROL-XL) pantoprazole (PROTONIX) tiZANidine (ZANAFLEX)   IF NEEDED: acetaminophen (TYLENOL) nitroGLYCERIN (NITROSTAT) ondansetron (ZOFRAN ODT) promethazine (PHENERGAN) oxyCODONE-acetaminophen (PERCOCET) albuterol (VENTOLIN HFA) inhaler   *Please bring all inhalers with you the day of surgery.*         >>Stop ELIQUIS 1-2 days prior to surgery.<<  As of today, STOP taking any Aspirin (unless otherwise instructed by your surgeon) and Aspirin containing products, Aleve, Naproxen, Ibuprofen, Motrin, Advil, Goody's, BC's, all herbal medications, fish oil, and all vitamins.   WHAT DO I DO ABOUT MY DIABETES MEDICATION?  .Marland KitchenDo not take metFORMIN (GLUCOPHAGE) the morning of surgery.   HOW TO MANAGE YOUR DIABETES BEFORE AND AFTER SURGERY  Why is it important to control my blood sugar before and after surgery? . Improving blood sugar levels before and after surgery helps healing and can limit problems. . A way of improving blood sugar control is eating a healthy diet by: o   Eating less sugar and carbohydrates o  Increasing activity/exercise o  Talking with your doctor about reaching your blood sugar goals . High blood sugars (greater than 180 mg/dL) can raise your risk of infections and slow your recovery, so you will need to focus on controlling your diabetes during the weeks before surgery. . Make sure that the doctor who takes care of your diabetes knows about your planned surgery including the date and location.  How do I manage my blood sugar before surgery? . Check your blood sugar at least 4 times a day, starting 2 days before surgery, to make sure that the level is not too high or low. . Check your blood sugar the morning of your surgery when you wake up and every 2 hours until you get to the Short Stay unit. o If your blood sugar is less than 70 mg/dL, you will need to treat for low blood sugar: - Do not take insulin. - Treat a low blood sugar (less than 70 mg/dL) with  cup of clear juice (cranberry or apple), 4 glucose tablets, OR glucose gel. - Recheck blood sugar in 15 minutes after treatment (to make sure it is greater than 70 mg/dL). If your blood sugar is not greater than 70 mg/dL on recheck, call [phone] further instructions. . Report your blood sugar to the short stay nurse when you get to Short Stay.  . If you are admitted to the hospital after surgery: o Your blood sugar will be checked by the staff and you will probably be given insulin after surgery (instead of oral diabetes medicines) to make sure you have  good blood sugar levels. o The goal for blood sugar control after surgery is 80-180 mg/dL.                      Do not wear jewelry.            Do not wear lotions, powders, colognes, or deodorant.            Men may shave face and neck.            Do not bring valuables to the hospital.            [facility] is not responsible for any belongings or valuables.  Do NOT Smoke (Tobacco/Vapping) or drink Alcohol 24 hours prior to  your procedure.  If you use a CPAP at night, you may bring all equipment for your overnight stay.   Contacts, glasses, dentures or bridgework may not be worn into surgery.      For patients admitted to the hospital, discharge time will be determined by your treatment team.   Patients discharged the day of surgery will not be allowed to drive home, and someone needs to stay with them for 24 hours.    Special instructions:   [facility]- Preparing For Surgery  Before surgery, you can play an important role. Because skin is not sterile, your skin needs to be as free of germs as possible. You can reduce the number of germs on your skin by washing with CHG (chlorahexidine gluconate) Soap before surgery.  CHG is an antiseptic cleaner which kills germs and bonds with the skin to continue killing germs even after washing.    Oral Hygiene is also important to reduce your risk of infection.  Remember - BRUSH YOUR TEETH THE MORNING OF SURGERY WITH YOUR REGULAR TOOTHPASTE  Please do not use if you have an allergy to CHG or antibacterial soaps. If your skin becomes reddened/irritated stop using the CHG.  Do not shave (including legs and underarms) for at least 48 hours prior to first CHG shower. It is OK to shave your face.  Please follow these instructions carefully.   1. Shower the NIGHT BEFORE SURGERY and the MORNING OF SURGERY with CHG Soap.   2. If you chose to wash your hair, wash your hair first as usual with your normal shampoo.  3. After you shampoo, rinse your hair and body thoroughly to remove the shampoo.  4. Use CHG as you would any other liquid soap. You can apply CHG directly to the skin and wash gently with a scrungie or a clean washcloth.   5. Apply the CHG Soap to your body ONLY FROM THE NECK DOWN.  Do not use on open wounds or open sores. Avoid contact with your eyes, ears, mouth and genitals (private parts). Wash Face and genitals (private parts)  with your normal soap.    6. Wash thoroughly, paying special attention to the area where your surgery will be performed.  7. Thoroughly rinse your body with warm water from the neck down.  8. DO NOT shower/wash with your normal soap after using and rinsing off the CHG Soap.  9. Pat yourself dry with a CLEAN TOWEL.  10. Wear CLEAN PAJAMAS to bed the night before surgery, wear comfortable clothes the morning of surgery  11. Place CLEAN SHEETS on your bed the night of your first shower and DO NOT SLEEP WITH PETS.   Day of Surgery:   Do not apply any deodorants/lotions.  Please wear clean clothes to the hospital/surgery center.   Remember to brush your teeth WITH YOUR REGULAR TOOTHPASTE.   Please read over the following fact sheets that you were given.

## 2019-06-04 ENCOUNTER — Other Ambulatory Visit: Payer: Self-pay

## 2019-06-04 ENCOUNTER — Encounter (HOSPITAL_COMMUNITY)
Admission: RE | Admit: 2019-06-04 | Discharge: 2019-06-04 | Disposition: A | Discharge status: Home / Self Care | Payer: Medicare PPO | Source: Ambulatory Visit | Attending: General Surgery | Admitting: General Surgery

## 2019-06-04 ENCOUNTER — Encounter (HOSPITAL_COMMUNITY): Payer: Self-pay

## 2019-06-04 ENCOUNTER — Other Ambulatory Visit (HOSPITAL_COMMUNITY)
Admission: RE | Admit: 2019-06-04 | Discharge: 2019-06-04 | Disposition: A | Discharge status: Home / Self Care | Payer: Medicare PPO | Source: Ambulatory Visit | Attending: General Surgery | Admitting: General Surgery

## 2019-06-04 DIAGNOSIS — Z955 Presence of coronary angioplasty implant and graft: Secondary | ICD-10-CM | POA: Insufficient documentation

## 2019-06-04 DIAGNOSIS — K7469 Other cirrhosis of liver: Secondary | ICD-10-CM | POA: Insufficient documentation

## 2019-06-04 DIAGNOSIS — Z01818 Encounter for other preprocedural examination: Secondary | ICD-10-CM | POA: Insufficient documentation

## 2019-06-04 DIAGNOSIS — I10 Essential (primary) hypertension: Secondary | ICD-10-CM | POA: Insufficient documentation

## 2019-06-04 DIAGNOSIS — I491 Atrial premature depolarization: Secondary | ICD-10-CM | POA: Insufficient documentation

## 2019-06-04 DIAGNOSIS — I4891 Unspecified atrial fibrillation: Secondary | ICD-10-CM | POA: Insufficient documentation

## 2019-06-04 DIAGNOSIS — Z20822 Contact with and (suspected) exposure to covid-19: Secondary | ICD-10-CM | POA: Insufficient documentation

## 2019-06-04 DIAGNOSIS — I2510 Atherosclerotic heart disease of native coronary artery without angina pectoris: Secondary | ICD-10-CM | POA: Insufficient documentation

## 2019-06-04 DIAGNOSIS — K766 Portal hypertension: Secondary | ICD-10-CM | POA: Insufficient documentation

## 2019-06-04 DIAGNOSIS — Z79899 Other long term (current) drug therapy: Secondary | ICD-10-CM | POA: Insufficient documentation

## 2019-06-04 DIAGNOSIS — D696 Thrombocytopenia, unspecified: Secondary | ICD-10-CM | POA: Insufficient documentation

## 2019-06-04 HISTORY — DX: Anemia, unspecified: D64.9

## 2019-06-04 HISTORY — DX: Encounter for adjustment and management of vascular access device: Z45.2

## 2019-06-04 HISTORY — DX: Unspecified asthma, uncomplicated: J45.909

## 2019-06-04 HISTORY — DX: Pneumonia, unspecified organism: J18.9

## 2019-06-04 LAB — CBC WITH DIFFERENTIAL/PLATELET
Abs Immature Granulocytes: 0.01 10*3/uL (ref 0.00–0.07)
Basophils Absolute: 0 10*3/uL (ref 0.0–0.1)
Basophils Relative: 1 %
Eosinophils Absolute: 0.1 10*3/uL (ref 0.0–0.5)
Eosinophils Relative: 3 %
HCT: 33.7 % — ABNORMAL LOW (ref 39.0–52.0)
Hemoglobin: 10 g/dL — ABNORMAL LOW (ref 13.0–17.0)
Immature Granulocytes: 1 %
Lymphocytes Relative: 23 %
Lymphs Abs: 0.5 10*3/uL — ABNORMAL LOW (ref 0.7–4.0)
MCH: 26.5 pg (ref 26.0–34.0)
MCHC: 29.7 g/dL — ABNORMAL LOW (ref 30.0–36.0)
MCV: 89.4 fL (ref 80.0–100.0)
Monocytes Absolute: 0.1 10*3/uL (ref 0.1–1.0)
Monocytes Relative: 6 %
Neutro Abs: 1.5 10*3/uL — ABNORMAL LOW (ref 1.7–7.7)
Neutrophils Relative %: 66 %
Platelets: 88 10*3/uL — ABNORMAL LOW (ref 150–400)
RBC: 3.77 MIL/uL — ABNORMAL LOW (ref 4.22–5.81)
RDW: 18 % — ABNORMAL HIGH (ref 11.5–15.5)
WBC: 2.2 10*3/uL — ABNORMAL LOW (ref 4.0–10.5)
nRBC: 0 % (ref 0.0–0.2)

## 2019-06-04 LAB — COMPREHENSIVE METABOLIC PANEL
ALT: 10 U/L (ref 0–44)
AST: 19 U/L (ref 15–41)
Albumin: 3.1 g/dL — ABNORMAL LOW (ref 3.5–5.0)
Alkaline Phosphatase: 30 U/L — ABNORMAL LOW (ref 38–126)
Anion gap: 8 (ref 5–15)
BUN: 17 mg/dL (ref 8–23)
CO2: 24 mmol/L (ref 22–32)
Calcium: 8.7 mg/dL — ABNORMAL LOW (ref 8.9–10.3)
Chloride: 103 mmol/L (ref 98–111)
Creatinine, Ser: 1.6 mg/dL — ABNORMAL HIGH (ref 0.61–1.24)
GFR calc Af Amer: 51 mL/min — ABNORMAL LOW (ref >60)
GFR calc non Af Amer: 44 mL/min — ABNORMAL LOW (ref >60)
Glucose, Bld: 100 mg/dL — ABNORMAL HIGH (ref 70–99)
Potassium: 5.9 mmol/L — ABNORMAL HIGH (ref 3.5–5.1)
Sodium: 135 mmol/L (ref 135–145)
Total Bilirubin: 0.5 mg/dL (ref 0.3–1.2)
Total Protein: 6.2 g/dL — ABNORMAL LOW (ref 6.5–8.1)

## 2019-06-04 LAB — PROTIME-INR
INR: 1.2 (ref 0.8–1.2)
Prothrombin Time: 14.8 seconds (ref 11.4–15.2)

## 2019-06-04 LAB — BLOOD GAS, ARTERIAL
Acid-base deficit: 2.3 mmol/L — ABNORMAL HIGH (ref 0.0–2.0)
Bicarbonate: 21.7 mmol/L (ref 20.0–28.0)
FIO2: 21
O2 Saturation: 97.1 %
Patient temperature: 37
pCO2 arterial: 35.9 mmHg (ref 32.0–48.0)
pH, Arterial: 7.4 (ref 7.350–7.450)
pO2, Arterial: 80.1 mmHg — ABNORMAL LOW (ref 83.0–108.0)

## 2019-06-04 LAB — HEMOGLOBIN A1C
Hgb A1c MFr Bld: 5.5 % (ref 4.8–5.6)
Mean Plasma Glucose: 111.15 mg/dL

## 2019-06-04 LAB — PREPARE RBC (CROSSMATCH)

## 2019-06-04 LAB — GLUCOSE, CAPILLARY: Glucose-Capillary: 88 mg/dL (ref 70–99)

## 2019-06-04 LAB — SARS CORONAVIRUS 2 (TAT 6-24 HRS): SARS Coronavirus 2: NEGATIVE

## 2019-06-04 LAB — LIPASE, BLOOD: Lipase: 48 U/L (ref 11–51)

## 2019-06-04 LAB — ABO/RH: ABO/RH(D): AB POS

## 2019-06-04 NOTE — Progress Notes (Signed)
Cardiology Office Note Date:  06/05/2019  Patient ID:  Steve Anthony, Steve Anthony 09-Feb-1950, MRN [id number] PCP:  [name], MD  Electrophysiologist:  Dr. Rayann Heman   Chief Complaint: recent hypotension   History of Present Illness: Steve Anthony is a 69 y.o. male with history of CAD (PCI 2006 LAD, notes report cath Oct 2014 revealed stable CAD with patent stent to mid LAD), COPD, HTN, HLD, hyperthyroidism, OSA (not on CPAP), AFIB, flutter (s/p CTI/PVI ablation 02/2009 and 09/2009), DM, SVT (AVNRT slow pathway ablated 2007), recurrent pancreatitis and pancreatic pseudocysts.   He saw Dr. Alinda Money 05/24/19 for R foot numbness.  His BP 78/44, he had been taking extra lasix for LE swelling an the Toprol BID (though not that AM). Recommended to go to the ER for hydration, he declined.  Discussed with the daughter who mentioned historically with the ascites he has had LE numbness 2/2 pressure from the swelling. The pt was recommended to not take the metoprolol that day and hold for SBP < 110.  The daughter called our office to address his BP worried his pancreas surgery would get cancelled if BP was not appropriate./OK  There is an H&P dated yesterday by Dr. [name], his note discusses (I suspect some is carried from an older note) "He has been admitted for this several times in the last two years. It appears that the pancreatitis started in August 2019. He was being followed for cirrhosis and splenomegaly. He has had infection of the pseudocyst multiple times, the last of which was in february of this year. He has aspiration and grew enterococcus fecalis. He has been seeing Dr. Lyndel Safe at [place]. He still has a PICC line The best he has felt for around 2-4 days post aspiration in february. At this point, he is having trouble taking in anything other than clears. He throws up if he tries to eat other food" ct abd/pelvis 03/29/2019 IMPRESSION: 1. Stable large pseudocyst in the tail of the pancreas.  No interval change. 2. Portal hypertension with extensive venous collateralization. 3. New small splenic infarction. PERCUTANEOUS DRAINAGE OF ABDOMINAL ABSCESS (49406) - STAT (DISCUSSED WITH DR Kathlene Cote. temporizing perc drain to get to surgery. ; urgent (this week)) LIPASE (94765) PARTIAL GASTRIC OUTLET OBSTRUCTION (K31.1) Impression: Will check labs to assess for dehydration and possible reinfection of cyst. He may need to be admitted for fluids if drainage cannot be performed soon. Current Plans METABOLIC PANEL, COMPREHENSIVE (80053) CBC, PLATELETS & AUT DIFF (46503) SEVERE PROTEIN-CALORIE MALNUTRITION (E43) Impression: Will check prealbumin to see if he needs TPN or other supplementation priro to surgery.  He comes in today to be seen for Dr. Rayann Heman, last seen by him via tele health visit Dec 2020 he was doing well, planned for annual visits, no changes were made. He comes at his daughter's insistence 2/2 low BP readings of late. He is scheduled for: Procedure: OPEN DISTAL PANCREATECTOMY/SPLENECTOMY  He was evaluated via our pre-op clinic/service and cleared back in March for his surgery, and by [facility] for 1-2 days off Eliquis pre-op  He tells me he was having cramps in his legs when he went to see the PMD and was found to have low BP.  He felt well that day without symptoms. He says he was taking the diuretics (lasix and aldactone) then but not any extra. He tells me he is on the water pills for swelling because of his varicose veins. He has not taken any since that visit. He has  taken a few doses of his metoprolol with BP in the AM >110, though all of his afternoon BP have been lower so no afternoon doses. He has taken no metoprolol at all since the AM of 06/02/19  I do not see aldactone 80m daily on his list with [place] he says he is no longer taking it  He tells me all in all he feels "fine".  He is very anxious to get this surgery behind him already.  He has a drain L abdomen, picc  RUE. and has been waiting for all the pieces to get into place for his surgery for months.   He reports from Feb when he was in the hospital so sick with his pancrease he weighed 246 and got down to 178lbs, he has put some weight back on over the last month-6weeks with his appetite hase been much imporved, but is trying to keep it down to about where he is "low 200s", he weighs 211 today  He denies any kind of CP, palpitations or cardiac awareness.  He denies any kind of physical limitations, runs after his gradkids, is on the move all the time, he can do his yard work, but gets hlp over the years, says he can still mIdamay No dizzy spells, no near syncope or syncope.  He is specific stating that even when his BP is very low, he feels no symptoms. No SOB or DOE.  He denies symptoms of PND or orthopnea.   RCRI score is 6.6% DUKE activity/METS is very good at 8.97   Past Medical History:  Diagnosis Date  . Anemia   . Arthritis   . Asthma   . CAD (coronary artery disease)    a. S/P PCI mid LAD (vision stent) 08/31/2004;  b. repeat cath 6/11 - no progression of CAD. c. Cath 10/2012 given moderate CAD on cardiac CT - no change from prior cath.  . Chest pain 12/16/2016  . COPD (chronic obstructive pulmonary disease) ([place]   . COVID-19 virus infection   . Diverticulitis   . DJD (degenerative joint disease) of lumbar spine   . Dyslipidemia   . Gallstones   . GERD (gastroesophageal reflux disease)   . History of kidney stones   . Hypertension   . Hyperthyroidism   . Melanoma ([place]    melanoma removed from neck   . On home oxygen therapy    "2L q hs" (11/08/2012),  06/04/19 pt. not on oxygen  . OSA (obstructive sleep apnea)    a. cpap noncompliance- patient reports on 12/10/14- does not use CPAP  . Palpitations    a. 10/2012 - s/p LINQ loop recorder to assess for arrhythmia.   . Pancreatitis   . Paroxysmal A-fib ([place]    a. S/P PVI 2/11 and 9/11  . PICC (peripherally inserted central  catheter) in place   . Pneumonia   . SVT (supraventricular tachycardia) ([facility]    a. Atypical AVNRT of the slow pathway  . Type II diabetes mellitus ([place]     Past Surgical History:  Procedure Laterality Date  . ATRIAL ABLATION SURGERY  2007   S/P slow pathway ablation for atypical AVNRT   . BIOPSY  07/17/2018   Procedure: BIOPSY;  Surgeon: MRush LandmarkGTelford Nab, MD;  [facility] [facility]  Service: Gastroenterology;;  . [facility] ~ 2008  . COLON RESECTION  02/2013   . COLONOSCOPY  12/25/2007   Colonic polyp status post polypectomy. Internal hemorrhoids.   . CORONARY ANGIOPLASTY WITH  STENT PLACEMENT  08/30/2004   Vision; to LAD   . CYSTOSCOPY W/ STONE MANIPULATION  ~ 1998   "once" (11/08/2012)  . EP Study  2008   Negative EP study in [place]  . ESOPHAGOGASTRODUODENOSCOPY (EGD) WITH PROPOFOL N/A 07/17/2018   Procedure: ESOPHAGOGASTRODUODENOSCOPY (EGD) WITH PROPOFOL;  Surgeon: Rush Landmark Telford Nab., MD;  Location: [facility];  Service: Gastroenterology;  Laterality: N/A;  . FRACTURE SURGERY    . LEFT HEART CATHETERIZATION WITH CORONARY ANGIOGRAM N/A 11/10/2012   Procedure: LEFT HEART CATHETERIZATION WITH CORONARY ANGIOGRAM;  Surgeon: Burnell Blanks, MD;  Location[facility] C[facility] LAB;  Service: Cardiovascular;  Laterality: N/A;  . LOOP RECORDER IMPLANT N/A 11/10/2012   Medtronic LinQ implanted by Dr Rayann Heman for afib management  . [place] SURGERY  2000; 2002  . POSTERIOR LUMBAR FUSION  2004  . PROSTATE SURGERY  ~ 2009  . PVI/ CTI ablation  02/25/2009 and 09/2009   S/P afib/ CTI ablation  . TIBIA FRACTURE SURGERY Right 1990   "broke in 3 places; had rod put in" (11/08/2012)  . TIBIA HARDWARE REMOVAL Right ~ 1991  . TONSILLECTOMY  1965  . TOTAL HIP ARTHROPLASTY Right 12/18/2014   Procedure: RIGHT TOTAL HIP ARTHROPLASTY ANTERIOR APPROACH;  Surgeon: Gaynelle Arabian, MD;  Location: [facility];  Service: Orthopedics;  Laterality: Right;  . UPPER ESOPHAGEAL ENDOSCOPIC ULTRASOUND  (EUS) N/A 07/17/2018   Procedure: UPPER ESOPHAGEAL ENDOSCOPIC ULTRASOUND (EUS);  Surgeon: Irving Copas., MD;  Location: [facility];  Service: Gastroenterology;  Laterality: N/A;    Current Outpatient Medications  Medication Sig Dispense Refill  . acetaminophen (TYLENOL) 325 MG tablet Take 650 mg by mouth every 6 (six) hours as needed for headache.     . albuterol (VENTOLIN HFA) 108 (90 Base) MCG/ACT inhaler Inhale 2 puffs into the lungs every 4 (four) hours as needed for wheezing or shortness of breath.     . cholecalciferol (VITAMIN D3) 25 MCG (1000 UNIT) tablet Take 1,000 Units by mouth daily.    . Cyanocobalamin (VITAMIN B-12 IJ) Inject 1,000 mcg as directed every 30 (thirty) days.    Marland Kitchen docusate sodium (COLACE) 100 MG capsule Take 100 mg by mouth See admin instructions. Three times a week    . ELIQUIS 5 MG TABS tablet TAKE 1 TABLET BY MOUTH TWICE A DAY (Patient taking differently: Take 5 mg by mouth 2 (two) times daily. ) 180 tablet 1  . escitalopram (LEXAPRO) 10 MG tablet Take 10 mg by mouth daily.  1  . fenofibrate 160 MG tablet Take 160 mg by mouth daily.      . ferrous sulfate 325 (65 FE) MG tablet Take 325 mg by mouth See admin instructions. Take 1 tablet 2 times a week    . furosemide (LASIX) 20 MG tablet Take 40 mg by mouth daily.    Marland Kitchen gabapentin (NEURONTIN) 300 MG capsule Take 300 mg by mouth 2 (two) times daily.     Marland Kitchen KLOR-CON M10 10 MEQ tablet Take 10 mEq by mouth daily.    . Lactobacillus (FLORAJEN ACIDOPHILUS PO) Take 1 capsule by mouth daily.    . Melatonin 10 MG TABS Take 10 mg by mouth at bedtime.     . metFORMIN (GLUCOPHAGE) 500 MG tablet Take 500 mg by mouth 2 (two) times daily.    . metoprolol tartrate (LOPRESSOR) 25 MG tablet Take 0.5 tablets (12.5 mg total) by mouth 2 (two) times daily. 90 tablet 0  . naloxone (NARCAN) 4 MG/0.1ML LIQD nasal spray kit  Place 1 spray into the nose daily as needed (opioid overdose).    . nitroGLYCERIN (NITROSTAT) 0.4 MG SL tablet  Place 1 tablet (0.4 mg total) under the tongue every 5 (five) minutes x 3 doses as needed for chest pain. 25 tablet 1  . ondansetron (ZOFRAN ODT) 4 MG disintegrating tablet Take 1 tablet (4 mg total) by mouth every 6 (six) hours as needed for nausea or vomiting. Place under tongue 30 tablet 1  . OVER THE COUNTER MEDICATION Apply 1 application topically daily as needed (rash). OTC fanny cream    . oxyCODONE-acetaminophen (PERCOCET) 10-325 MG tablet Take 1 tablet by mouth every 4 (four) hours.     . pantoprazole (PROTONIX) 40 MG tablet TAKE 1 TABLET BY MOUTH TWICE A DAY 180 tablet 1  . promethazine (PHENERGAN) 25 MG tablet Take 1 tablet (25 mg total) by mouth every 8 (eight) hours as needed for nausea or vomiting. 30 tablet 2  . Tamsulosin HCl (FLOMAX) 0.4 MG CAPS Take 0.4 mg by mouth daily.      Marland Kitchen tiZANidine (ZANAFLEX) 4 MG tablet Take 4 mg by mouth 2 (two) times daily.     . traZODone (DESYREL) 50 MG tablet Take 100 mg by mouth at bedtime.      No current facility-administered medications for this visit.    Allergies:   Penicillins   Social History:  The patient  reports that he quit smoking about 15 years ago. His smoking use included cigarettes. He has a 114.00 pack-year smoking history. He quit smokeless tobacco use about 15 years ago.  His smokeless tobacco use included chew. He reports previous alcohol use. He reports that he does not use drugs.   Family History:  The patient's family history includes Diabetes in his father; Emphysema in his maternal grandfather and mother; Heart attack in his father; Heart disease in his father and paternal grandfather; Hypertension in his father.  ROS:  Please see the history of present illness.   All other systems are reviewed and otherwise negative.   PHYSICAL EXAM:  VS:  BP 100/62   Pulse 96   Ht 6' (1.829 m)   Wt 211 lb (95.7 kg)   SpO2 96%   BMI 28.62 kg/m  BMI: Body mass index is 28.62 kg/m. Well nourished, well developed, in no acute  distress, appears chronically ill, older then his age 42: normocephalic, atraumatic  Neck: no JVD, carotid bruits or masses Cardiac:  RRR; no significant murmurs, no rubs, or gallops Lungs:  CTA b/l, no wheezing, rhonchi or rales  Abd: he has a drain L abdomen, appears clean MS: no deformity, advanced atrophy Ext: he has no pitting edema, he has large varicose veins RLE especially with some brawny type, minimal edema L>R picc RUE  Skin: warm and dry, no rash, not diaphoretic Neuro:  No gross deficits appreciated Psych: euthymic mood, full affect   EKG:  Done yesterday is reviewed by myself (with Dr. Tamala Julian (DOD)) 06/04/2019; SR/sinus arrhythmia, 68bpm, , low voltage, no significant changes from 2019 EKG  12/17/2016: TTE Study Conclusions  - Left ventricle: The cavity size was normal. Wall thickness was  increased in a pattern of mild LVH. Systolic function was normal.  The estimated ejection fraction was in the range of 55% to 60%.  Wall motion was normal; there were no regional wall motion  abnormalities. Left ventricular diastolic function parameters  were normal.  - Left atrium: The atrium was mildly dilated.    11/10/2012: [facility] Angiographic  Findings:  Left main: Aneurysmal segment at distal portion of vessel. Unchanged from last cath. No obstructive disease.   Left Anterior Descending Artery: Large caliber vessel that courses to the apex. There are two small caliber diagonal branches. The proximal segment of the LAD was aneurysmal. The mid LAD just beyond the diagonal branch has a focal 50% stenosis, unchanged from last cath in 2011. The stent in the mid LAD is patent without restenosis. The distal vessel has mild plaque disease.   Circumflex Artery: Large caliber vessel with aneurysmal proximal segment. The first obtuse marginal branch is bifurcating vessel with mild plaque disease. The distal AV groove Circumflex has diffuse 30% stenosis.   Right Coronary  Artery: Large dominant vessel with proximal 30% stenosis, serial 20% stenoses in the mid and distal vessel.   Left Ventricular Angiogram: Deferred.   Impression: 1. Stable single vessel CAD with patent stent mid LAD, stable moderate stenosis mid LAD   Recent Labs: 03/11/2019: Magnesium 2.0 06/04/2019: ALT 10; BUN 17; Creatinine, Ser 1.60; Hemoglobin 10.0; Platelets 88; Potassium 5.9; Sodium 135  02/20/2019: Cholesterol 113; HDL 20; LDL Cholesterol 38; Total CHOL/HDL Ratio 5.7; Triglycerides 276; VLDL 55   Estimated Creatinine Clearance: 53.7 mL/min (A) (by C-G formula based on SCr of 1.6 mg/dL (H)).   Wt Readings from Last 3 Encounters:  06/05/19 211 lb (95.7 kg)  06/04/19 214 lb 6.4 oz (97.3 kg)  04/23/19 208 lb (94.3 kg)     Other studies reviewed: Additional studies/records reviewed today include: summarized above  ASSESSMENT AND PLAN:  1. Paroxysmal AFib     CHA2DS2Vasc is 4, on Eliquis, now on hold for his surgery Thursday     Resume a/c post-op as per surgeon's instructions  2. CAD     No anginal symptoms     He has an excellent DUKE score with >8METS    I have reviewed the patient's information, case with Dr. Tamala Julian. He brings BP logs that go back to January, Feb he is noted to have intermittent SBP 80's, HR 90's Perhaps given his rapid and significant weight loss and illness then, No April readings noted, AM BP this month 100's-110's, afternoon 80's-90's, some lower and higher  Would like to have some betablocker on board for cardiac protection and to try and avoid rebound tachycardia, arrhythmias with betablocker withdrawal, particularly with the stress of a big surgery He has tolerated 74m BID of Toprol with some intermittent hypotension now for months Stop the Toprol Start lopressor 12.542mBID, to start tonight  I discussed this with the patient, the rational, he says he understands and will get on the new metoprolol  3. HTN     Now with relative hypotension      Perhaps 2/2 to significant weight loss     He is asymptomatic, appears non-toxic    Disposition: F/u with [place] 3 months, sooner if needed  NOTE: after the patient left, I note that he had labs done yesterday (pre-op), his K+ 5.9, and  Creat 1.6 We did not discuss this at the time of his visit though I called the patient and spoke with him.  He was still taking his K+ without the lasix. [place] [facility] and his Potassium corrected if needed. I advised him not to take his potassium any longer until instructed to. He stated understanding was going to go to the ER. He asked  me to call his daughter April. I spoke with her via telephone our plan for his betablocker and she was very thankful to hear we were placing him back on low dose BB. She was worried that the ER doctors would admit her dad and he would end up missing his surgery..  She had the PMD nurse practitioner phone number and felt like they could get his labs done stat via the outpt center at [facility]. She was going to call the PMD office to follow up on getting the K+ and his Creat rechecked   I discussed the importance of this and danger of high potassium, she is a nurse and expressed understanding at that it would be taken care of today.   UPDATE: April the daughter called, the patient was able to have stat labs via PMD office, repeat K+ was 5.5, he had not yet taken his K+ and they instructed him not to going forward as well.   Current medicines are reviewed at length with the patient today.  The patient did not have any concerns regarding medicines  Signed, Steve Standard, PA-C 06/05/2019 1:19 PM     [address] [place] [phone] (office)  [phone] (fax)

## 2019-06-04 NOTE — H&P (Signed)
Steve Anthony Location: [facility] Patient #: [id number] DOB: 1950-04-27 Married / Language: English / Race: White Male   History of Present Illness  The patient is a 69 year old male who presents with acute pancreatitis. Pt has a complicated history involving pancreatitis and pancreatic pseudocysts. He has been admitted for this several times in the last two years. It appears that the pancreatitis started in August 2019. He was being followed for cirrhosis and splenomegaly. He has had infection of the pseudocyst multiple times, the last of which was in february of this year. He has aspiration and grew enterococcus fecalis. He has been seeing Dr. Lyndel Safe at [place]. He still has a PICC line. He has been off antibiotics for 2-3 weeks. The best he has felt for around 2-4 days post aspiration in february. At this point, he is having trouble taking in anything other than clears. He throws up if he tries to eat other food. He is afebrile. He denies n/v/c. He is still urinating. he denies dizziness. He has pain in his mid left sided abdomen.    ct abd/pelvis 03/29/2019 IMPRESSION: 1. Stable large pseudocyst in the tail of the pancreas. No interval change. 2. Portal hypertension with extensive venous collateralization. 3. New small splenic infarction.    Diagnostic Studies History Colonoscopy  5-10 years ago  Allergies  Penicillins  Allergies Reconciled   Medication History  Eliquis (5MG Tablet, Oral) Active. Metoprolol Succinate ER (50MG Tablet ER 24HR, Oral) Active. Pantoprazole Sodium (40MG Tablet DR, Oral) Active. Gabapentin (300MG Capsule, Oral) Active. metFORMIN HCl (500MG Tablet, Oral) Active. Escitalopram Oxalate (10MG Tablet, Oral) Active. Spironolactone (25MG Tablet, Oral) Active. Furosemide (20MG Tablet, Oral) Active. Stool Softener (100MG Tablet, Oral) Active. Fenofibrate (160MG Tablet, Oral) Active. tiZANidine HCl (4MG Tablet,  Oral) Active. traZODone HCl (50MG Tablet, Oral) Active. oxyCODONE-Acetaminophen (10-325MG Tablet, Oral) Active. Cyanocobalamin (1000MCG/ML Solution, Injection) Active. Nitroglycerin (0.6MG Tab Sublingual, Sublingual) Active. Narcan (4MG/0.1ML Liquid, Nasal) Active. Tamsulosin HCl (0.4MG Capsule, Oral) Active. Medications Reconciled  Social History  Alcohol use  Remotely quit alcohol use. Caffeine use  Carbonated beverages, Coffee, Tea. Tobacco use  Current every day smoker.  Family History Arthritis  Mother. Diabetes Mellitus  Father. Hypertension  Father.  Other Problems  Arthritis  Asthma  Atrial Fibrillation  Back Pain  Chest pain  Chronic Obstructive Lung Disease  Diabetes Mellitus  Diverticulosis  Gastroesophageal Reflux Disease  High blood pressure  Hypercholesterolemia  Pancreatitis  Thyroid Disease  Umbilical Hernia Repair     Review of Systems General Present- Appetite Loss and Weight Loss. Not Present- Chills, Fatigue, Fever, Night Sweats and Weight Gain. HEENT Not Present- Earache, Hearing Loss, Hoarseness, Nose Bleed, Oral Ulcers, Ringing in the Ears, Seasonal Allergies, Sinus Pain, Sore Throat, Visual Disturbances, Wears glasses/contact lenses and Yellow Eyes. Respiratory Not Present- Bloody sputum, Chronic Cough, Difficulty Breathing, Snoring and Wheezing. Breast Not Present- Breast Mass, Breast Pain, Nipple Discharge and Skin Changes. Cardiovascular Not Present- Chest Pain, Difficulty Breathing Lying Down, Leg Cramps, Palpitations, Rapid Heart Rate, Shortness of Breath and Swelling of Extremities. Gastrointestinal Present- Abdominal Pain, Change in Bowel Habits, Indigestion and Nausea. Not Present- Bloating, Bloody Stool, Chronic diarrhea, Constipation, Difficulty Swallowing, Excessive gas, Gets full quickly at meals, Hemorrhoids, Rectal Pain and Vomiting.  Vitals  Weight: 214.2 lb Height: 73in Body Surface Area: 2.22 m  Body Mass Index: 28.26 kg/m  Temp.: 97.43F  Pulse: 100 (Regular)  BP: 100/68(Sitting, Left Arm, Standard)       Physical Exam General  Mental Status-Alert. General Appearance-Consistent with stated age. Hydration-Well hydrated. Voice-Normal.  Head and Neck Head-normocephalic, atraumatic with no lesions or palpable masses.  Eye Sclera/Conjunctiva - Bilateral-No scleral icterus.  Chest and Lung Exam Chest and lung exam reveals -quiet, even and easy respiratory effort with no use of accessory muscles. Inspection Chest Wall - Normal. Back - normal.  Breast - Did not examine.  Cardiovascular Cardiovascular examination reveals -normal pedal pulses bilaterally. Note: regular rate and rhythm  Abdomen Inspection-Inspection Normal. Palpation/Percussion Palpation and Percussion of the abdomen reveal - Soft, No Rebound tenderness and No Rigidity (guarding). Note: fullness LUQ. + splenomegaly. tender LUQ.  Peripheral Vascular Upper Extremity Inspection - Bilateral - Normal - No Clubbing, No Cyanosis, No Edema, Pulses Intact. Lower Extremity Palpation - Edema - Bilateral - No edema - Bilateral.  Neurologic Neurologic evaluation reveals -alert and oriented x 3 with no impairment of recent or remote memory. Mental Status-Normal.  Musculoskeletal Global Assessment -Note: no gross deformities.  Normal Exam - Left-Upper Extremity Strength Normal and Lower Extremity Strength Normal. Normal Exam - Right-Upper Extremity Strength Normal and Lower Extremity Strength Normal.  Lymphatic Head & Neck  General Head & Neck Lymphatics: Bilateral - Description - Normal. Axillary  General Axillary Region: Bilateral - Description - Normal. Tenderness - Non Tender.    Assessment & Plan  PANCREATIC PSEUDOCYST (K86.3) Impression: This is a significant enlarging pseudocyst. He will need something done to this. Unfortunately, he has varices. Ideally I  would do a distal pancreatectomy wtih splenectomy, but this may not be possible given the inflammatory changes and varices. The cyst is too far away from the gastric wall for cyst gastrostomy.  The second choice would be a peustow wtih a cyst jejunostomy and drain internally, but again, if the varices were prohibitive, would not be able to get large enough window to do this.  My last choice would be to open it up and widely drain with multiple large bore drains. This may be the only choice we have.  I have spoken to Dr. Kathlene Cote about temporizing the situation with a perc drain. This is difficult as well due to anatomy and varices. They will attempt.  I discussed risks of surgery with the patient, wife, and daughter (on phone) which are extensive and include death, prolonged recovery, and prolonged drains. Current Plans PERCUTANEOUS DRAINAGE OF ABDOMINAL ABSCESS (49406) - STAT (DISCUSSED WITH DR Kathlene Cote. temporizing perc drain to get to surgery. ; urgent (this week)) LIPASE (39030) PARTIAL GASTRIC OUTLET OBSTRUCTION (K31.1) Impression: Will check labs to assess for dehydration and possible reinfection of cyst. He may need to be admitted for fluids if drainage cannot be performed soon. Current Plans METABOLIC PANEL, COMPREHENSIVE (80053) CBC, PLATELETS & AUT DIFF (09233) SEVERE PROTEIN-CALORIE MALNUTRITION (E43) Impression: Will check prealbumin to see if he needs TPN or other supplementation priro to surgery.  This is highly complex.  45 min spent in evaluation, examination, counseling, and coordination of care. >50% spent in counseling. Current Plans PREALBUMIN (00762) CIRRHOSIS (K74.60) Impression: No evidence of ascites on physical exam. No visible large abdominal wall varices, but this will complicate matters. Current Plans PT (PROTHROMBIN TIME) (26333) ANTICOAGULATED (Z79.01) Impression: Will need permission and possible risk stratification from cardiology to hold eliquis

## 2019-06-04 NOTE — Progress Notes (Addendum)
PCP - [name] @ [facility] in [place] Hematology: Dr. Lyndel Safe Cardiologist - Tomi Likens  Chest x-ray - na EKG - 06/04/19 Stress Test - 11/18 ECHO - 11/18 Cardiac Cath - 6/11  Sleep Study - yrs ago CPAP - na  Fasting Blood Sugar - 88-116 Checks Blood Sugar ___2__ times a day  Blood Thinner Instructions: last dose eliquis 06/03/19 Aspirin Instructions:  ERAS Protcol -yes PRE-SURGERY Ensure - none ordered  COVID TEST- 06/04/19   Anesthesia review: cardiac hx. Pt. Has appointment with cardiology due to blood pressure inconsistent. Pt. Denies symptoms  Notified Dr. Marlowe Aschoff office,spoke to [facility]. of abnormal labs.  Patient denies shortness of breath, fever, cough and chest pain at PAT appointment   All instructions explained to the patient, with a verbal understanding of the material. Patient agrees to go over the instructions while at home for a better understanding. Patient also instructed to self quarantine after being tested for COVID-19. The opportunity to ask questions was provided.

## 2019-06-04 NOTE — Progress Notes (Signed)
Anesthesia Chart Review:  Follows with GI for hx of acute idiopathic recurrent pancreatitis with enlarging, infected pancreatic tail pseudocyst, Cryptogenic liver cirrhosis (likely d/t NASH, Dx 02/2018 on CT) with portal hypertension- moderate splenomegaly (on CT), mild thrombocytopenia, portal venous collaterals. Gd 1 eso varices on EGD (06/2018). Neg WU for etiology 2020. Immune to A but not to B. No ascites or HE. Nl AFP. Last seen by Dr. Lyndel Safe 04/02/19, discussed plan for surgery with Dr. [name].   Pt had drain placed to pancreatic pseudocyst by IR on 04/23/19. He also has PICC line in place.   Follows with cardiology for hx of CAD s/p stent to LAD 2006, afib (previously had LINQ implanted device), HTN. Last seen by Dr. Rayann Heman 12/16/0, doing well from cardiac standpoint at that time. Recently pt has been having hypotension and BP meds have been reduced. He was seen 06/05/19 for additional med titration given his recent hypotension. Per note, "Would like to have some betablocker on board for cardiac protection and to try and avoid rebound tachycardia, arrhythmias with betablocker withdrawal, particularly with the stress of a big surgery. He has tolerated 51m BID of Toprol with some intermittent hypotension now for months. Stop the Toprol. Start lopressor 12.511mBID, to start tonight."  Cardiac clearance per telephone encounter 04/18/19: "Chart reviewed as part of pre-operative protocol coverage. Given past medical history and time since last visit, based on ACC/AHA guidelines, DaHATIM HOMANNould be at acceptable risk for the planned procedure without further cardiovascular testing.His RCRI is a class III risk, 6.6% risk of major cardiac event.  He is able to perform greater than 4 METS of physical activity.  Patient with diagnosis of afib on Eliquis for anticoagulation.  Procedure: OPEN DISTAL PANCREATECTOMY/SPLENECTOMY  Date of procedure: TBD  CHADS2-VASc score of 4 ( HTN, AGE, DM2, CAD)  CrCl 83  ml/min  Per office protocol, patient can hold Eliquis for 2 days prior to procedure."  Review of preop labs shows mild anemia Hgb 10.0, hyperkalemia K+ 5.9 and elevated creatinine 1.60 (review of recent labs shows creatinine ranging from 1.06-1.66). Per Epic, Dr. ByBarry Dienesas reviewed the results. Will recheck BMP on DOS.   EKG 06/04/19(read per cardiology note 06/05/19): SR/sinus arrhythmia, 68bpm, , low voltage, no significant changes from 2019 EKG  CT Abdomen 03/29/19: IMPRESSION: 1. Stable large pseudocyst in the tail of the pancreas. No interval change. 2. Portal hypertension with extensive venous collateralization. 3. New small splenic infarction.  12/17/2016: TTE Study Conclusions  - Left ventricle: The cavity size was normal. Wall thickness was  increased in a pattern of mild LVH. Systolic function was normal.  The estimated ejection fraction was in the range of 55% to 60%.  Wall motion was normal; there were no regional wall motion  abnormalities. Left ventricular diastolic function parameters  were normal.  - Left atrium: The atrium was mildly dilated.    11/10/2012: LHC Impression: 1. Stable single vessel CAD with patent stent mid LAD, stable moderate stenosis mid LAD   JaWynonia MustyCOceans [facility]/Anesthesiology Phone ([phone]/11/2019 2:21 PM

## 2019-06-05 ENCOUNTER — Other Ambulatory Visit: Payer: Self-pay

## 2019-06-05 ENCOUNTER — Ambulatory Visit: Payer: Medicare PPO | Admitting: Physician Assistant

## 2019-06-05 ENCOUNTER — Telehealth: Payer: Self-pay | Admitting: Physician Assistant

## 2019-06-05 VITALS — BP 100/62 | HR 96 | Ht 72.0 in | Wt 211.0 lb

## 2019-06-05 DIAGNOSIS — I2510 Atherosclerotic heart disease of native coronary artery without angina pectoris: Secondary | ICD-10-CM

## 2019-06-05 DIAGNOSIS — I480 Paroxysmal atrial fibrillation: Secondary | ICD-10-CM

## 2019-06-05 DIAGNOSIS — I10 Essential (primary) hypertension: Secondary | ICD-10-CM | POA: Diagnosis not present

## 2019-06-05 MED ORDER — METOPROLOL TARTRATE 25 MG PO TABS: 12.5000 mg | ORAL_TABLET | Freq: Two times a day (BID) | ORAL | 0 refills | Status: DC

## 2019-06-05 NOTE — Patient Instructions (Signed)
Medication Instructions:   STOP  TAKING  TOPROL XL    START TAKING METOPROLOL LOPRESSOR  12.5 MG TWICE A DAY   *If you need a refill on your cardiac medications before your next appointment, please call your pharmacy*   Lab Work: NONE ORDERED  TODAY   If you have labs (blood work) drawn today and your tests are completely normal, you will receive your results only by: Marland Kitchen MyChart Message (if you have MyChart) OR . A paper copy in the mail If you have any lab test that is abnormal or we need to change your treatment, we will call you to review the results.   Testing/Procedures:  NONE ORDERED  TODAY   Follow-Up: At [facility], you and your health needs are our priority.  As part of our continuing mission to provide you with exceptional heart care, we have created designated Provider Care Teams.  These Care Teams include your primary Cardiologist (physician) and Advanced Practice Providers (APPs -  Physician Assistants and Nurse Practitioners) who all work together to provide you with the care you need, when you need it.  We recommend signing up for the patient portal called "MyChart".  Sign up information is provided on this After Visit Summary.  MyChart is used to connect with patients for Virtual Visits (Telemedicine).  Patients are able to view lab/test results, encounter notes, upcoming appointments, etc.  Non-urgent messages can be sent to your provider as well.   To learn more about what you can do with MyChart, go to [email or web address].    Your next appointment:   3 month(s)  The format for your next appointment:   In Person  Provider:   You may see Dr. Rayann Heman  or one of the following Advanced Practice Providers on your designated Care Team:    Chanetta Marshall, NP  Tommye Standard, PA-C  Legrand Como "Oda Kilts, [place]    Other Instructions

## 2019-06-05 NOTE — Anesthesia Preprocedure Evaluation (Addendum)
Anesthesia Evaluation  Patient identified by MRN, date of birth, ID band Patient awake    Reviewed: Allergy & Precautions, NPO status , Patient's Chart, lab work & pertinent test results, reviewed documented beta blocker date and time   History of Anesthesia Complications Negative for: history of anesthetic complications  Airway Mallampati: II  TM Distance: >3 FB Neck ROM: Full    Dental  (+) Edentulous Upper, Edentulous Lower   Pulmonary asthma , sleep apnea (untreated) , COPD, former smoker,    Pulmonary exam normal        Cardiovascular hypertension, Pt. on home beta blockers and Pt. on medications + CAD and + Cardiac Stents (2006)  Normal cardiovascular exam+ dysrhythmias (on Eliquis) Atrial Fibrillation   TTE 2018: EF 55-60%, mild LAE   Neuro/Psych negative neurological ROS  negative psych ROS   GI/Hepatic GERD  Medicated and Controlled,(+) Cirrhosis       , Pancreatitis   Endo/Other  diabetes, Type 2, Oral Hypoglycemic Agents  Renal/GU Renal InsufficiencyRenal disease (Cr 1.6, K 5.9)  negative genitourinary   Musculoskeletal  (+) Arthritis ,   Abdominal   Peds  Hematology  (+) anemia , Hgb 10.0, Plt 88   Anesthesia Other Findings Day of surgery medications reviewed with patient.  Reproductive/Obstetrics negative OB ROS                           Anesthesia Physical Anesthesia Plan  ASA: IV  Anesthesia Plan: General   Post-op Pain Management:    Induction: Intravenous  PONV Risk Score and Plan: 4 or greater and Treatment may vary due to age or medical condition, Midazolam, Ondansetron and Dexamethasone  Airway Management Planned: Oral ETT  Additional Equipment: Arterial line  Intra-op Plan:   Post-operative Plan: Possible Post-op intubation/ventilation  Informed Consent: I have reviewed the patients History and Physical, chart, labs and discussed the procedure  including the risks, benefits and alternatives for the proposed anesthesia with the patient or authorized representative who has indicated his/her understanding and acceptance.     Dental advisory given  Plan Discussed with: CRNA  Anesthesia Plan Comments: (PAT note by Karoline Caldwell, PA-C: Follows with GI for hx of acute idiopathic recurrent pancreatitis with enlarging, infected pancreatic tail pseudocyst, Cryptogenic liver cirrhosis (likely d/t NASH, Dx 02/2018 on CT) with portal hypertension- moderate splenomegaly (on CT), mild thrombocytopenia, portal venous collaterals. Gd 1 eso varices on EGD (06/2018). Neg WU for etiology 2020. Immune to A but not to B. No ascites or HE. Nl AFP. Last seen by Dr. Lyndel Safe 04/02/19, discussed plan for surgery with Dr. [name].   Pt had drain placed to pancreatic pseudocyst by IR on 04/23/19. He also has PICC line in place.   Follows with cardiology for hx of CAD s/p stent to LAD 2006, afib (previously had LINQ implanted device), HTN. Last seen by Dr. Rayann Heman 12/16/0, doing well from cardiac standpoint at that time. Recently pt has been having hypotension and BP meds have been reduced. He was seen 06/05/19 for additional med titration given his recent hypotension. Per note, "Would like to have some betablocker on board for cardiac protection and to try and avoid rebound tachycardia, arrhythmias with betablocker withdrawal, particularly with the stress of a big surgery. He has tolerated 92m BID of Toprol with some intermittent hypotension now for months. Stop the Toprol. Start lopressor 12.527mBID, to start tonight."  Cardiac clearance per telephone encounter 04/18/19: "Chart reviewed as part of pre-operative  protocol coverage. Given past medical history and time since last visit, based on ACC/AHA guidelines, CHANCY SMIGIEL would be at acceptable risk for the planned procedure without further cardiovascular testing.His RCRI is a class III risk, 6.6% risk of major cardiac event.   He is able to perform greater than 4 METS of physical activity.  Patient with diagnosis of afib on Eliquis for anticoagulation.  Procedure: OPEN DISTAL PANCREATECTOMY/SPLENECTOMY  Date of procedure: TBD  CHADS2-VASc score of 4 ( HTN, AGE, DM2, CAD)  CrCl 83 ml/min  Per office protocol, patient can hold Eliquis for 2 days prior to procedure."  Review of preop labs shows mild anemia Hgb 10.0, hyperkalemia K+ 5.9 and elevated creatinine 1.60 (review of recent labs shows creatinine ranging from 1.06-1.66). Per Epic, Dr. [name] has reviewed the results. Will recheck BMP on DOS.   EKG 06/04/19(read per cardiology note 06/05/19): SR/sinus arrhythmia, 68bpm, , low voltage, no significant changes from 2019 EKG  CT Abdomen 03/29/19: IMPRESSION: 1. Stable large pseudocyst in the tail of the pancreas. No interval change. 2. Portal hypertension with extensive venous collateralization. 3. New small splenic infarction.  12/17/2016: TTE Study Conclusions  - Left ventricle: The cavity size was normal. Wall thickness was  increased in a pattern of mild LVH. Systolic function was normal.  The estimated ejection fraction was in the range of 55% to 60%.  Wall motion was normal; there were no regional wall motion  abnormalities. Left ventricular diastolic function parameters  were normal.  - Left atrium: The atrium was mildly dilated.    11/10/2012: LHC Impression: 1. Stable single vessel CAD with patent stent mid LAD, stable moderate stenosis mid LAD)      Anesthesia Quick Evaluation

## 2019-06-05 NOTE — Telephone Encounter (Signed)
      I went in pt chart to see what pt was calling back for

## 2019-06-07 ENCOUNTER — Encounter (HOSPITAL_COMMUNITY): Payer: Self-pay | Admitting: General Surgery

## 2019-06-07 ENCOUNTER — Inpatient Hospital Stay (HOSPITAL_COMMUNITY)
Admission: RE | Admit: 2019-06-07 | Discharge: 2019-06-18 | DRG: 405 | Disposition: A | Discharge status: Home / Self Care | Payer: Medicare PPO | Attending: General Surgery | Admitting: General Surgery

## 2019-06-07 ENCOUNTER — Other Ambulatory Visit: Payer: Self-pay

## 2019-06-07 ENCOUNTER — Inpatient Hospital Stay (HOSPITAL_COMMUNITY): Payer: Medicare PPO | Admitting: Physician Assistant

## 2019-06-07 ENCOUNTER — Encounter (HOSPITAL_COMMUNITY)
Admission: RE | Disposition: A | Discharge status: Home / Self Care | Payer: Self-pay | Source: Home / Self Care | Attending: General Surgery

## 2019-06-07 DIAGNOSIS — K8582 Other acute pancreatitis with infected necrosis: Secondary | ICD-10-CM | POA: Diagnosis present

## 2019-06-07 DIAGNOSIS — K7469 Other cirrhosis of liver: Secondary | ICD-10-CM | POA: Diagnosis present

## 2019-06-07 DIAGNOSIS — K311 Adult hypertrophic pyloric stenosis: Secondary | ICD-10-CM | POA: Diagnosis present

## 2019-06-07 DIAGNOSIS — E871 Hypo-osmolality and hyponatremia: Secondary | ICD-10-CM | POA: Diagnosis not present

## 2019-06-07 DIAGNOSIS — Z823 Family history of stroke: Secondary | ICD-10-CM

## 2019-06-07 DIAGNOSIS — E781 Pure hyperglyceridemia: Secondary | ICD-10-CM | POA: Diagnosis present

## 2019-06-07 DIAGNOSIS — G4733 Obstructive sleep apnea (adult) (pediatric): Secondary | ICD-10-CM | POA: Diagnosis present

## 2019-06-07 DIAGNOSIS — I864 Gastric varices: Secondary | ICD-10-CM | POA: Diagnosis present

## 2019-06-07 DIAGNOSIS — Z23 Encounter for immunization: Secondary | ICD-10-CM | POA: Diagnosis present

## 2019-06-07 DIAGNOSIS — I959 Hypotension, unspecified: Secondary | ICD-10-CM | POA: Diagnosis present

## 2019-06-07 DIAGNOSIS — E43 Unspecified severe protein-calorie malnutrition: Secondary | ICD-10-CM | POA: Diagnosis present

## 2019-06-07 DIAGNOSIS — I951 Orthostatic hypotension: Secondary | ICD-10-CM | POA: Diagnosis not present

## 2019-06-07 DIAGNOSIS — I2510 Atherosclerotic heart disease of native coronary artery without angina pectoris: Secondary | ICD-10-CM | POA: Diagnosis present

## 2019-06-07 DIAGNOSIS — I480 Paroxysmal atrial fibrillation: Secondary | ICD-10-CM | POA: Diagnosis not present

## 2019-06-07 DIAGNOSIS — Z79899 Other long term (current) drug therapy: Secondary | ICD-10-CM

## 2019-06-07 DIAGNOSIS — K766 Portal hypertension: Secondary | ICD-10-CM | POA: Diagnosis present

## 2019-06-07 DIAGNOSIS — Z8719 Personal history of other diseases of the digestive system: Secondary | ICD-10-CM

## 2019-06-07 DIAGNOSIS — Z833 Family history of diabetes mellitus: Secondary | ICD-10-CM

## 2019-06-07 DIAGNOSIS — J449 Chronic obstructive pulmonary disease, unspecified: Secondary | ICD-10-CM | POA: Diagnosis present

## 2019-06-07 DIAGNOSIS — K219 Gastro-esophageal reflux disease without esophagitis: Secondary | ICD-10-CM | POA: Diagnosis present

## 2019-06-07 DIAGNOSIS — Z87891 Personal history of nicotine dependence: Secondary | ICD-10-CM

## 2019-06-07 DIAGNOSIS — Z95828 Presence of other vascular implants and grafts: Secondary | ICD-10-CM

## 2019-06-07 DIAGNOSIS — Z20822 Contact with and (suspected) exposure to covid-19: Secondary | ICD-10-CM | POA: Diagnosis present

## 2019-06-07 DIAGNOSIS — D72829 Elevated white blood cell count, unspecified: Secondary | ICD-10-CM | POA: Diagnosis not present

## 2019-06-07 DIAGNOSIS — Z955 Presence of coronary angioplasty implant and graft: Secondary | ICD-10-CM

## 2019-06-07 DIAGNOSIS — K863 Pseudocyst of pancreas: Secondary | ICD-10-CM

## 2019-06-07 DIAGNOSIS — K5900 Constipation, unspecified: Secondary | ICD-10-CM | POA: Diagnosis not present

## 2019-06-07 DIAGNOSIS — I950 Idiopathic hypotension: Secondary | ICD-10-CM | POA: Diagnosis not present

## 2019-06-07 DIAGNOSIS — R578 Other shock: Secondary | ICD-10-CM | POA: Diagnosis not present

## 2019-06-07 DIAGNOSIS — Z8616 Personal history of COVID-19: Secondary | ICD-10-CM | POA: Diagnosis not present

## 2019-06-07 DIAGNOSIS — Z96641 Presence of right artificial hip joint: Secondary | ICD-10-CM | POA: Diagnosis present

## 2019-06-07 DIAGNOSIS — Z880 Allergy status to penicillin: Secondary | ICD-10-CM

## 2019-06-07 DIAGNOSIS — E7800 Pure hypercholesterolemia, unspecified: Secondary | ICD-10-CM | POA: Diagnosis present

## 2019-06-07 DIAGNOSIS — D62 Acute posthemorrhagic anemia: Secondary | ICD-10-CM | POA: Diagnosis not present

## 2019-06-07 DIAGNOSIS — D6959 Other secondary thrombocytopenia: Secondary | ICD-10-CM | POA: Diagnosis not present

## 2019-06-07 DIAGNOSIS — E875 Hyperkalemia: Secondary | ICD-10-CM | POA: Diagnosis not present

## 2019-06-07 DIAGNOSIS — R188 Other ascites: Secondary | ICD-10-CM | POA: Diagnosis present

## 2019-06-07 DIAGNOSIS — Z87442 Personal history of urinary calculi: Secondary | ICD-10-CM

## 2019-06-07 DIAGNOSIS — G8929 Other chronic pain: Secondary | ICD-10-CM | POA: Diagnosis present

## 2019-06-07 DIAGNOSIS — E785 Hyperlipidemia, unspecified: Secondary | ICD-10-CM | POA: Diagnosis present

## 2019-06-07 DIAGNOSIS — Z9049 Acquired absence of other specified parts of digestive tract: Secondary | ICD-10-CM

## 2019-06-07 DIAGNOSIS — R161 Splenomegaly, not elsewhere classified: Secondary | ICD-10-CM | POA: Diagnosis present

## 2019-06-07 DIAGNOSIS — Z825 Family history of asthma and other chronic lower respiratory diseases: Secondary | ICD-10-CM

## 2019-06-07 DIAGNOSIS — Z981 Arthrodesis status: Secondary | ICD-10-CM

## 2019-06-07 DIAGNOSIS — K7581 Nonalcoholic steatohepatitis (NASH): Secondary | ICD-10-CM | POA: Diagnosis present

## 2019-06-07 DIAGNOSIS — Z6828 Body mass index (BMI) 28.0-28.9, adult: Secondary | ICD-10-CM

## 2019-06-07 DIAGNOSIS — E119 Type 2 diabetes mellitus without complications: Secondary | ICD-10-CM | POA: Diagnosis present

## 2019-06-07 DIAGNOSIS — Z8261 Family history of arthritis: Secondary | ICD-10-CM

## 2019-06-07 DIAGNOSIS — Z85820 Personal history of malignant melanoma of skin: Secondary | ICD-10-CM

## 2019-06-07 DIAGNOSIS — Z8249 Family history of ischemic heart disease and other diseases of the circulatory system: Secondary | ICD-10-CM

## 2019-06-07 DIAGNOSIS — I4891 Unspecified atrial fibrillation: Secondary | ICD-10-CM | POA: Diagnosis not present

## 2019-06-07 DIAGNOSIS — I10 Essential (primary) hypertension: Secondary | ICD-10-CM | POA: Diagnosis present

## 2019-06-07 DIAGNOSIS — Z7901 Long term (current) use of anticoagulants: Secondary | ICD-10-CM

## 2019-06-07 DIAGNOSIS — Z7984 Long term (current) use of oral hypoglycemic drugs: Secondary | ICD-10-CM

## 2019-06-07 DIAGNOSIS — R000 Tachycardia, unspecified: Secondary | ICD-10-CM | POA: Diagnosis not present

## 2019-06-07 HISTORY — PX: SPLENECTOMY, TOTAL: SHX788

## 2019-06-07 HISTORY — DX: Pseudocyst of pancreas: K86.3

## 2019-06-07 LAB — BASIC METABOLIC PANEL
Anion gap: 9 (ref 5–15)
Anion gap: 6 (ref 5–15)
BUN: 16 mg/dL (ref 8–23)
BUN: 14 mg/dL (ref 8–23)
CO2: 22 mmol/L (ref 22–32)
CO2: 17 mmol/L — ABNORMAL LOW (ref 22–32)
Calcium: 8.6 mg/dL — ABNORMAL LOW (ref 8.9–10.3)
Calcium: 7.8 mg/dL — ABNORMAL LOW (ref 8.9–10.3)
Chloride: 100 mmol/L (ref 98–111)
Chloride: 110 mmol/L (ref 98–111)
Creatinine, Ser: 2.08 mg/dL — ABNORMAL HIGH (ref 0.61–1.24)
Creatinine, Ser: 1.51 mg/dL — ABNORMAL HIGH (ref 0.61–1.24)
GFR calc Af Amer: 37 mL/min — ABNORMAL LOW (ref >60)
GFR calc Af Amer: 55 mL/min — ABNORMAL LOW (ref >60)
GFR calc non Af Amer: 32 mL/min — ABNORMAL LOW (ref >60)
GFR calc non Af Amer: 47 mL/min — ABNORMAL LOW (ref >60)
Glucose, Bld: 95 mg/dL (ref 70–99)
Glucose, Bld: 219 mg/dL — ABNORMAL HIGH (ref 70–99)
Potassium: 5.3 mmol/L — ABNORMAL HIGH (ref 3.5–5.1)
Potassium: 5.7 mmol/L — ABNORMAL HIGH (ref 3.5–5.1)
Sodium: 131 mmol/L — ABNORMAL LOW (ref 135–145)
Sodium: 133 mmol/L — ABNORMAL LOW (ref 135–145)

## 2019-06-07 LAB — CBC
HCT: 32 % — ABNORMAL LOW (ref 39.0–52.0)
Hemoglobin: 9.8 g/dL — ABNORMAL LOW (ref 13.0–17.0)
MCH: 27.7 pg (ref 26.0–34.0)
MCHC: 30.6 g/dL (ref 30.0–36.0)
MCV: 90.4 fL (ref 80.0–100.0)
Platelets: 94 10*3/uL — ABNORMAL LOW (ref 150–400)
RBC: 3.54 MIL/uL — ABNORMAL LOW (ref 4.22–5.81)
RDW: 16 % — ABNORMAL HIGH (ref 11.5–15.5)
WBC: 11.5 10*3/uL — ABNORMAL HIGH (ref 4.0–10.5)
nRBC: 0 % (ref 0.0–0.2)

## 2019-06-07 LAB — POCT I-STAT 7, (LYTES, BLD GAS, ICA,H+H)
Acid-base deficit: 6 mmol/L — ABNORMAL HIGH (ref 0.0–2.0)
Acid-base deficit: 7 mmol/L — ABNORMAL HIGH (ref 0.0–2.0)
Bicarbonate: 20.6 mmol/L (ref 20.0–28.0)
Bicarbonate: 19.7 mmol/L — ABNORMAL LOW (ref 20.0–28.0)
Calcium, Ion: 1.17 mmol/L (ref 1.15–1.40)
Calcium, Ion: 1.33 mmol/L (ref 1.15–1.40)
HCT: 26 % — ABNORMAL LOW (ref 39.0–52.0)
HCT: 27 % — ABNORMAL LOW (ref 39.0–52.0)
Hemoglobin: 8.8 g/dL — ABNORMAL LOW (ref 13.0–17.0)
Hemoglobin: 9.2 g/dL — ABNORMAL LOW (ref 13.0–17.0)
O2 Saturation: 99 %
O2 Saturation: 100 %
Patient temperature: 35.7
Potassium: 6.1 mmol/L — ABNORMAL HIGH (ref 3.5–5.1)
Potassium: 5.9 mmol/L — ABNORMAL HIGH (ref 3.5–5.1)
Sodium: 132 mmol/L — ABNORMAL LOW (ref 135–145)
Sodium: 133 mmol/L — ABNORMAL LOW (ref 135–145)
TCO2: 22 mmol/L (ref 22–32)
TCO2: 21 mmol/L — ABNORMAL LOW (ref 22–32)
pCO2 arterial: 46.6 mmHg (ref 32.0–48.0)
pCO2 arterial: 42.7 mmHg (ref 32.0–48.0)
pH, Arterial: 7.253 — ABNORMAL LOW (ref 7.350–7.450)
pH, Arterial: 7.265 — ABNORMAL LOW (ref 7.350–7.450)
pO2, Arterial: 135 mmHg — ABNORMAL HIGH (ref 83.0–108.0)
pO2, Arterial: 192 mmHg — ABNORMAL HIGH (ref 83.0–108.0)

## 2019-06-07 LAB — GLUCOSE, CAPILLARY
Glucose-Capillary: 83 mg/dL (ref 70–99)
Glucose-Capillary: 203 mg/dL — ABNORMAL HIGH (ref 70–99)
Glucose-Capillary: 213 mg/dL — ABNORMAL HIGH (ref 70–99)
Glucose-Capillary: 237 mg/dL — ABNORMAL HIGH (ref 70–99)
Glucose-Capillary: 185 mg/dL — ABNORMAL HIGH (ref 70–99)

## 2019-06-07 LAB — BPAM PLATELET PHERESIS
Blood Product Expiration Date: 202105142359
ISSUE DATE / TIME: 202105131015
Unit Type and Rh: 6200

## 2019-06-07 LAB — MAGNESIUM: Magnesium: 1 mg/dL — ABNORMAL LOW (ref 1.7–2.4)

## 2019-06-07 LAB — PREPARE PLATELET PHERESIS: Unit division: 0

## 2019-06-07 LAB — MRSA PCR SCREENING: MRSA by PCR: NEGATIVE

## 2019-06-07 LAB — PROTIME-INR
INR: 1.4 — ABNORMAL HIGH (ref 0.8–1.2)
Prothrombin Time: 16.4 seconds — ABNORMAL HIGH (ref 11.4–15.2)

## 2019-06-07 LAB — PHOSPHORUS: Phosphorus: 4.1 mg/dL (ref 2.5–4.6)

## 2019-06-07 SURGERY — PANCREATECTOMY
Anesthesia: General | Site: Abdomen

## 2019-06-07 MED ORDER — HYDROMORPHONE HCL 1 MG/ML IJ SOLN
INTRAMUSCULAR | Status: DC | PRN
  Administered 2019-06-07 (×2): .5 mg via INTRAVENOUS

## 2019-06-07 MED ORDER — CHLORHEXIDINE GLUCONATE CLOTH 2 % EX PADS
6.0000 | MEDICATED_PAD | Freq: Every day | CUTANEOUS | Status: DC
  Administered 2019-06-07 – 2019-06-18 (×12): 6 via TOPICAL

## 2019-06-07 MED ORDER — TIZANIDINE HCL 4 MG PO TABS
4.0000 mg | ORAL_TABLET | Freq: Two times a day (BID) | ORAL | Status: DC
  Administered 2019-06-08 – 2019-06-16 (×17): 4 mg via ORAL
  Filled 2019-06-07 (×3): qty 1
  Filled 2019-06-07 (×4): qty 2
  Filled 2019-06-07 (×2): qty 1
  Filled 2019-06-07: qty 2
  Filled 2019-06-07: qty 1
  Filled 2019-06-07: qty 2
  Filled 2019-06-07 (×6): qty 1

## 2019-06-07 MED ORDER — ONDANSETRON HCL 4 MG/2ML IJ SOLN: 4.0000 mg | Freq: Four times a day (QID) | INTRAMUSCULAR | Status: DC | PRN

## 2019-06-07 MED ORDER — 0.9 % SODIUM CHLORIDE (POUR BTL) OPTIME
TOPICAL | Status: DC | PRN
  Administered 2019-06-07 (×3): 1000 mL

## 2019-06-07 MED ORDER — DIPHENHYDRAMINE HCL 12.5 MG/5ML PO ELIX
12.5000 mg | ORAL_SOLUTION | Freq: Four times a day (QID) | ORAL | Status: DC | PRN
  Filled 2019-06-07: qty 5

## 2019-06-07 MED ORDER — MAGNESIUM SULFATE 2 GM/50ML IV SOLN
2.0000 g | Freq: Once | INTRAVENOUS | Status: AC
  Administered 2019-06-07: 2 g via INTRAVENOUS
  Filled 2019-06-07: qty 50

## 2019-06-07 MED ORDER — CALCIUM GLUCONATE-NACL 1-0.675 GM/50ML-% IV SOLN
1.0000 g | Freq: Once | INTRAVENOUS | Status: AC
  Administered 2019-06-07: 1000 mg via INTRAVENOUS
  Filled 2019-06-07: qty 50

## 2019-06-07 MED ORDER — CIPROFLOXACIN IN D5W 400 MG/200ML IV SOLN
400.0000 mg | INTRAVENOUS | Status: AC
  Administered 2019-06-07: 400 mg via INTRAVENOUS

## 2019-06-07 MED ORDER — SCOPOLAMINE 1 MG/3DAYS TD PT72: 1.0000 | MEDICATED_PATCH | TRANSDERMAL | Status: DC

## 2019-06-07 MED ORDER — OXYCODONE-ACETAMINOPHEN 5-325 MG PO TABS
1.0000 | ORAL_TABLET | ORAL | Status: DC
  Administered 2019-06-07 – 2019-06-08 (×2): 1 via ORAL
  Filled 2019-06-07 (×3): qty 1

## 2019-06-07 MED ORDER — GABAPENTIN 300 MG PO CAPS
300.0000 mg | ORAL_CAPSULE | Freq: Two times a day (BID) | ORAL | Status: DC
  Administered 2019-06-08 – 2019-06-13 (×11): 300 mg via ORAL
  Filled 2019-06-07 (×11): qty 1

## 2019-06-07 MED ORDER — OXYCODONE HCL 5 MG PO TABS
5.0000 mg | ORAL_TABLET | ORAL | Status: DC
  Administered 2019-06-07 – 2019-06-08 (×2): 5 mg via ORAL
  Filled 2019-06-07 (×3): qty 1

## 2019-06-07 MED ORDER — PROPOFOL 10 MG/ML IV BOLUS
INTRAVENOUS | Status: DC | PRN
  Administered 2019-06-07: 180 mg via INTRAVENOUS

## 2019-06-07 MED ORDER — ALBUTEROL SULFATE (2.5 MG/3ML) 0.083% IN NEBU: 3.0000 mL | INHALATION_SOLUTION | RESPIRATORY_TRACT | Status: DC | PRN

## 2019-06-07 MED ORDER — CHLORHEXIDINE GLUCONATE CLOTH 2 % EX PADS: 6.0000 | MEDICATED_PAD | Freq: Once | CUTANEOUS | Status: DC

## 2019-06-07 MED ORDER — CIPROFLOXACIN IN D5W 400 MG/200ML IV SOLN
INTRAVENOUS | Status: AC
  Filled 2019-06-07: qty 200

## 2019-06-07 MED ORDER — PHENYLEPHRINE HCL-NACL 10-0.9 MG/250ML-% IV SOLN
INTRAVENOUS | Status: DC | PRN
  Administered 2019-06-07: 140 ug/min via INTRAVENOUS
  Administered 2019-06-07: 60 ug/min via INTRAVENOUS

## 2019-06-07 MED ORDER — PHENYLEPHRINE HCL-NACL 10-0.9 MG/250ML-% IV SOLN
INTRAVENOUS | Status: AC
  Filled 2019-06-07: qty 250

## 2019-06-07 MED ORDER — TRAZODONE HCL 100 MG PO TABS
100.0000 mg | ORAL_TABLET | Freq: Every day | ORAL | Status: DC
  Administered 2019-06-08 – 2019-06-17 (×10): 100 mg via ORAL
  Filled 2019-06-07 (×2): qty 1
  Filled 2019-06-07: qty 2
  Filled 2019-06-07: qty 1
  Filled 2019-06-07 (×2): qty 2
  Filled 2019-06-07 (×3): qty 1
  Filled 2019-06-07: qty 2

## 2019-06-07 MED ORDER — HYDROMORPHONE 1 MG/ML IV SOLN
INTRAVENOUS | Status: AC
  Filled 2019-06-07: qty 30

## 2019-06-07 MED ORDER — ORAL CARE MOUTH RINSE
15.0000 mL | Freq: Two times a day (BID) | OROMUCOSAL | Status: DC
  Administered 2019-06-07 – 2019-06-18 (×17): 15 mL via OROMUCOSAL

## 2019-06-07 MED ORDER — ESCITALOPRAM OXALATE 10 MG PO TABS
10.0000 mg | ORAL_TABLET | Freq: Every day | ORAL | Status: DC
  Administered 2019-06-08 – 2019-06-18 (×11): 10 mg via ORAL
  Filled 2019-06-07 (×11): qty 1

## 2019-06-07 MED ORDER — DIPHENHYDRAMINE HCL 50 MG/ML IJ SOLN: 12.5000 mg | Freq: Four times a day (QID) | INTRAMUSCULAR | Status: DC | PRN

## 2019-06-07 MED ORDER — ROCURONIUM BROMIDE 10 MG/ML (PF) SYRINGE
PREFILLED_SYRINGE | INTRAVENOUS | Status: DC | PRN
  Administered 2019-06-07: 70 mg via INTRAVENOUS
  Administered 2019-06-07: 30 mg via INTRAVENOUS
  Administered 2019-06-07: 10 mg via INTRAVENOUS

## 2019-06-07 MED ORDER — SODIUM CHLORIDE 0.9% FLUSH: 9.0000 mL | INTRAVENOUS | Status: DC | PRN

## 2019-06-07 MED ORDER — METOPROLOL TARTRATE 25 MG PO TABS
12.5000 mg | ORAL_TABLET | Freq: Two times a day (BID) | ORAL | Status: DC
  Filled 2019-06-07 (×2): qty 1

## 2019-06-07 MED ORDER — TAMSULOSIN HCL 0.4 MG PO CAPS
0.4000 mg | ORAL_CAPSULE | Freq: Every day | ORAL | Status: DC
  Administered 2019-06-08 – 2019-06-18 (×11): 0.4 mg via ORAL
  Filled 2019-06-07 (×11): qty 1

## 2019-06-07 MED ORDER — BUPIVACAINE HCL (PF) 0.25 % IJ SOLN
INTRAMUSCULAR | Status: AC
  Filled 2019-06-07: qty 10

## 2019-06-07 MED ORDER — CIPROFLOXACIN IN D5W 400 MG/200ML IV SOLN
400.0000 mg | Freq: Two times a day (BID) | INTRAVENOUS | Status: AC
  Administered 2019-06-07: 400 mg via INTRAVENOUS
  Filled 2019-06-07: qty 200

## 2019-06-07 MED ORDER — FENTANYL CITRATE (PF) 250 MCG/5ML IJ SOLN
INTRAMUSCULAR | Status: AC
  Filled 2019-06-07: qty 5

## 2019-06-07 MED ORDER — SUGAMMADEX SODIUM 200 MG/2ML IV SOLN
INTRAVENOUS | Status: DC | PRN
  Administered 2019-06-07: 200 mg via INTRAVENOUS

## 2019-06-07 MED ORDER — NITROGLYCERIN 0.4 MG SL SUBL: 0.4000 mg | SUBLINGUAL_TABLET | SUBLINGUAL | Status: DC | PRN

## 2019-06-07 MED ORDER — ALBUMIN HUMAN 5 % IV SOLN
INTRAVENOUS | Status: DC | PRN
Start: 2019-06-07 — End: 2019-06-07

## 2019-06-07 MED ORDER — PROPOFOL 10 MG/ML IV BOLUS
INTRAVENOUS | Status: AC
  Filled 2019-06-07: qty 40

## 2019-06-07 MED ORDER — SODIUM CHLORIDE 0.9% IV SOLUTION: Freq: Once | INTRAVENOUS | Status: DC

## 2019-06-07 MED ORDER — CALCIUM CHLORIDE 10 % IV SOLN
INTRAVENOUS | Status: DC | PRN
  Administered 2019-06-07: 200 mg via INTRAVENOUS
  Administered 2019-06-07: 300 mg via INTRAVENOUS
  Administered 2019-06-07: 200 mg via INTRAVENOUS
  Administered 2019-06-07: 300 mg via INTRAVENOUS

## 2019-06-07 MED ORDER — PROCHLORPERAZINE MALEATE 10 MG PO TABS
10.0000 mg | ORAL_TABLET | Freq: Four times a day (QID) | ORAL | Status: DC | PRN
  Filled 2019-06-07: qty 1

## 2019-06-07 MED ORDER — PHENYLEPHRINE HCL (PRESSORS) 10 MG/ML IV SOLN
INTRAVENOUS | Status: AC
  Filled 2019-06-07: qty 1

## 2019-06-07 MED ORDER — STERILE WATER FOR IRRIGATION IR SOLN
Status: DC | PRN
  Administered 2019-06-07: 1000 mL

## 2019-06-07 MED ORDER — BUPIVACAINE 0.25 % ON-Q PUMP DUAL CATH 300 ML
300.0000 mL | INJECTION | Status: AC
  Filled 2019-06-07: qty 300

## 2019-06-07 MED ORDER — LACTATED RINGERS IV SOLN
INTRAVENOUS | Status: DC | PRN
Start: 2019-06-07 — End: 2019-06-07

## 2019-06-07 MED ORDER — HYDROMORPHONE HCL 1 MG/ML IJ SOLN
INTRAMUSCULAR | Status: AC
  Filled 2019-06-07: qty 0.5

## 2019-06-07 MED ORDER — SODIUM CHLORIDE 0.9 % IV SOLN: INTRAVENOUS | Status: DC | PRN

## 2019-06-07 MED ORDER — NALOXONE HCL 0.4 MG/ML IJ SOLN: 0.4000 mg | INTRAMUSCULAR | Status: DC | PRN

## 2019-06-07 MED ORDER — PANTOPRAZOLE SODIUM 40 MG IV SOLR
40.0000 mg | Freq: Every day | INTRAVENOUS | Status: DC
  Administered 2019-06-07 – 2019-06-10 (×4): 40 mg via INTRAVENOUS
  Filled 2019-06-07 (×4): qty 40

## 2019-06-07 MED ORDER — MIDAZOLAM HCL 2 MG/2ML IJ SOLN
INTRAMUSCULAR | Status: AC
  Filled 2019-06-07: qty 2

## 2019-06-07 MED ORDER — BUPIVACAINE ON-Q PAIN PUMP (FOR ORDER SET NO CHG)
INJECTION | Status: AC
  Filled 2019-06-07: qty 1

## 2019-06-07 MED ORDER — OXYCODONE-ACETAMINOPHEN 10-325 MG PO TABS: 1.0000 | ORAL_TABLET | ORAL | Status: DC

## 2019-06-07 MED ORDER — ACETAMINOPHEN 325 MG PO TABS
650.0000 mg | ORAL_TABLET | Freq: Four times a day (QID) | ORAL | Status: DC | PRN
  Administered 2019-06-08 – 2019-06-18 (×14): 650 mg via ORAL
  Filled 2019-06-07 (×14): qty 2

## 2019-06-07 MED ORDER — INSULIN ASPART 100 UNIT/ML ~~LOC~~ SOLN
0.0000 [IU] | SUBCUTANEOUS | Status: DC
  Administered 2019-06-07: 2 [IU] via SUBCUTANEOUS
  Administered 2019-06-07 (×3): 3 [IU] via SUBCUTANEOUS
  Administered 2019-06-08: 1 [IU] via SUBCUTANEOUS
  Administered 2019-06-08: 2 [IU] via SUBCUTANEOUS
  Administered 2019-06-08 (×3): 1 [IU] via SUBCUTANEOUS
  Administered 2019-06-09: 2 [IU] via SUBCUTANEOUS
  Administered 2019-06-09 (×2): 1 [IU] via SUBCUTANEOUS
  Administered 2019-06-09: 2 [IU] via SUBCUTANEOUS
  Administered 2019-06-09 – 2019-06-10 (×4): 1 [IU] via SUBCUTANEOUS
  Administered 2019-06-10 (×2): 2 [IU] via SUBCUTANEOUS
  Administered 2019-06-11 (×3): 1 [IU] via SUBCUTANEOUS
  Administered 2019-06-12: 2 [IU] via SUBCUTANEOUS
  Administered 2019-06-12 – 2019-06-13 (×5): 1 [IU] via SUBCUTANEOUS

## 2019-06-07 MED ORDER — SCOPOLAMINE 1 MG/3DAYS TD PT72
MEDICATED_PATCH | TRANSDERMAL | Status: AC
  Administered 2019-06-07: 1.5 mg via TRANSDERMAL
  Filled 2019-06-07: qty 1

## 2019-06-07 MED ORDER — FIBRIN SEALANT 2 ML SINGLE DOSE KIT
PACK | CUTANEOUS | Status: DC | PRN
  Administered 2019-06-07 (×3): 2 mL via TOPICAL

## 2019-06-07 MED ORDER — BUPIVACAINE HCL (PF) 0.25 % IJ SOLN
INTRAMUSCULAR | Status: DC | PRN
  Administered 2019-06-07: 10 mL

## 2019-06-07 MED ORDER — EPHEDRINE SULFATE-NACL 50-0.9 MG/10ML-% IV SOSY
PREFILLED_SYRINGE | INTRAVENOUS | Status: DC | PRN
  Administered 2019-06-07: 5 mg via INTRAVENOUS
  Administered 2019-06-07: 10 mg via INTRAVENOUS
  Administered 2019-06-07: 15 mg via INTRAVENOUS
  Administered 2019-06-07: 5 mg via INTRAVENOUS
  Administered 2019-06-07: 15 mg via INTRAVENOUS

## 2019-06-07 MED ORDER — DEXTROSE-NACL 5-0.45 % IV SOLN: INTRAVENOUS | Status: DC

## 2019-06-07 MED ORDER — ONDANSETRON HCL 4 MG/2ML IJ SOLN
INTRAMUSCULAR | Status: DC | PRN
  Administered 2019-06-07: 4 mg via INTRAVENOUS

## 2019-06-07 MED ORDER — FUROSEMIDE 40 MG PO TABS
40.0000 mg | ORAL_TABLET | Freq: Every day | ORAL | Status: DC
  Administered 2019-06-08 – 2019-06-14 (×7): 40 mg via ORAL
  Filled 2019-06-07 (×7): qty 1

## 2019-06-07 MED ORDER — LIDOCAINE 2% (20 MG/ML) 5 ML SYRINGE
INTRAMUSCULAR | Status: DC | PRN
  Administered 2019-06-07: 100 mg via INTRAVENOUS

## 2019-06-07 MED ORDER — METHOCARBAMOL 1000 MG/10ML IJ SOLN
500.0000 mg | Freq: Three times a day (TID) | INTRAVENOUS | Status: DC | PRN
  Filled 2019-06-07 (×3): qty 5

## 2019-06-07 MED ORDER — MELATONIN 5 MG PO TABS
10.0000 mg | ORAL_TABLET | Freq: Every day | ORAL | Status: DC
  Administered 2019-06-07 – 2019-06-17 (×11): 10 mg via ORAL
  Filled 2019-06-07 (×11): qty 2

## 2019-06-07 MED ORDER — ROCURONIUM BROMIDE 10 MG/ML (PF) SYRINGE
PREFILLED_SYRINGE | INTRAVENOUS | Status: AC
  Filled 2019-06-07: qty 10

## 2019-06-07 MED ORDER — HYDROMORPHONE HCL 1 MG/ML IJ SOLN
0.2500 mg | INTRAMUSCULAR | Status: DC | PRN
  Administered 2019-06-07 (×2): 0.5 mg via INTRAVENOUS

## 2019-06-07 MED ORDER — PHENYLEPHRINE HCL-NACL 10-0.9 MG/250ML-% IV SOLN
INTRAVENOUS | Status: AC
  Administered 2019-06-07: 20 ug/min via INTRAVENOUS
  Filled 2019-06-07: qty 250

## 2019-06-07 MED ORDER — MIDAZOLAM HCL 5 MG/5ML IJ SOLN
INTRAMUSCULAR | Status: DC | PRN
  Administered 2019-06-07: 1 mg via INTRAVENOUS

## 2019-06-07 MED ORDER — HYDROMORPHONE 1 MG/ML IV SOLN
INTRAVENOUS | Status: DC
  Administered 2019-06-07: 30 mg via INTRAVENOUS
  Administered 2019-06-07: 2.1 mg via INTRAVENOUS
  Administered 2019-06-07: 3 mg via INTRAVENOUS
  Administered 2019-06-07: 3.3 mg via INTRAVENOUS
  Administered 2019-06-08: 0.3 mg via INTRAVENOUS
  Administered 2019-06-08: 1.2 mg via INTRAVENOUS
  Administered 2019-06-08: 3.6 mg via INTRAVENOUS
  Administered 2019-06-08: 4.8 mg via INTRAVENOUS
  Administered 2019-06-08: 3 mg via INTRAVENOUS
  Administered 2019-06-08 – 2019-06-09 (×2): 2.4 mg via INTRAVENOUS
  Administered 2019-06-09: 30 mg via INTRAVENOUS
  Administered 2019-06-09 (×2): 0.6 mg via INTRAVENOUS
  Filled 2019-06-07: qty 30

## 2019-06-07 MED ORDER — INSULIN ASPART 100 UNIT/ML ~~LOC~~ SOLN
SUBCUTANEOUS | Status: AC
  Filled 2019-06-07: qty 1

## 2019-06-07 MED ORDER — FENTANYL CITRATE (PF) 250 MCG/5ML IJ SOLN
INTRAMUSCULAR | Status: DC | PRN
  Administered 2019-06-07 (×5): 50 ug via INTRAVENOUS

## 2019-06-07 MED ORDER — HYDROMORPHONE HCL 1 MG/ML IJ SOLN
INTRAMUSCULAR | Status: AC
  Filled 2019-06-07: qty 1

## 2019-06-07 MED ORDER — PHENYLEPHRINE 40 MCG/ML (10ML) SYRINGE FOR IV PUSH (FOR BLOOD PRESSURE SUPPORT)
PREFILLED_SYRINGE | INTRAVENOUS | Status: DC | PRN
  Administered 2019-06-07 (×2): 120 ug via INTRAVENOUS
  Administered 2019-06-07 (×2): 160 ug via INTRAVENOUS

## 2019-06-07 MED ORDER — PROCHLORPERAZINE EDISYLATE 10 MG/2ML IJ SOLN
5.0000 mg | Freq: Four times a day (QID) | INTRAMUSCULAR | Status: DC | PRN
  Administered 2019-06-11 – 2019-06-12 (×2): 10 mg via INTRAVENOUS
  Administered 2019-06-14: 5 mg via INTRAVENOUS
  Filled 2019-06-07 (×3): qty 2

## 2019-06-07 MED ORDER — SODIUM CHLORIDE 0.9 % IV SOLN
INTRAVENOUS | Status: DC | PRN
  Administered 2019-06-07: 500 mL via INTRAVENOUS

## 2019-06-07 MED ORDER — PHENYLEPHRINE HCL-NACL 10-0.9 MG/250ML-% IV SOLN
0.0000 ug/min | INTRAVENOUS | Status: DC
  Administered 2019-06-07: 18 ug/min via INTRAVENOUS
  Administered 2019-06-08: 70 ug/min via INTRAVENOUS
  Administered 2019-06-08: 120 ug/min via INTRAVENOUS
  Administered 2019-06-08: 180 ug/min via INTRAVENOUS
  Administered 2019-06-08: 80 ug/min via INTRAVENOUS
  Administered 2019-06-08: 50 ug/min via INTRAVENOUS
  Administered 2019-06-08: 18 ug/min via INTRAVENOUS
  Administered 2019-06-09: 60 ug/min via INTRAVENOUS
  Administered 2019-06-09: 50 ug/min via INTRAVENOUS
  Administered 2019-06-09: 70 ug/min via INTRAVENOUS
  Administered 2019-06-09: 80 ug/min via INTRAVENOUS
  Administered 2019-06-09: 70 ug/min via INTRAVENOUS
  Filled 2019-06-07 (×5): qty 250
  Filled 2019-06-07: qty 500
  Filled 2019-06-07 (×4): qty 250

## 2019-06-07 MED ORDER — LACTATED RINGERS IV SOLN: INTRAVENOUS | Status: DC | PRN

## 2019-06-07 MED ORDER — PROMETHAZINE HCL 25 MG/ML IJ SOLN: 6.2500 mg | INTRAMUSCULAR | Status: DC | PRN

## 2019-06-07 MED ORDER — DEXAMETHASONE SODIUM PHOSPHATE 10 MG/ML IJ SOLN
INTRAMUSCULAR | Status: DC | PRN
  Administered 2019-06-07: 5 mg via INTRAVENOUS

## 2019-06-07 MED ORDER — BUPIVACAINE 0.25 % ON-Q PUMP DUAL CATH 300 ML
INJECTION | Status: AC | PRN
  Administered 2019-06-07: 300 mL

## 2019-06-07 SURGICAL SUPPLY — 69 items
BAG BILE T-TUBES STRL (MISCELLANEOUS) ×4 IMPLANT
BAG DRN 9.5 2 ADJ BELT ADPR (MISCELLANEOUS) ×2
BIOPATCH RED 1 DISK 7.0 (GAUZE/BANDAGES/DRESSINGS) ×2 IMPLANT
BIOPATCH RED 1IN DISK 7.0MM (GAUZE/BANDAGES/DRESSINGS) ×2
BLADE CLIPPER SURG (BLADE) IMPLANT
BLADE SURG 11 STRL SS (BLADE) ×2 IMPLANT
CANISTER SUCT 3000ML PPV (MISCELLANEOUS) ×3 IMPLANT
CATH KIT ON-Q SILVERSOAK 7.5 (CATHETERS) IMPLANT
CATH KIT ON-Q SILVERSOAK 7.5IN (CATHETERS) ×6 IMPLANT
CLIP VESOCCLUDE LG 6/CT (CLIP) ×2 IMPLANT
CLIP VESOCCLUDE MED 24/CT (CLIP) ×2 IMPLANT
CLIP VESOLOCK LG 6/CT PURPLE (CLIP) ×2 IMPLANT
CLIP VESOLOCK MED 6/CT (CLIP) ×2 IMPLANT
CLIP VESOLOCK MED LG 6/CT (CLIP) ×2 IMPLANT
COVER MAYO STAND STRL (DRAPES) ×2 IMPLANT
COVER SURGICAL LIGHT HANDLE (MISCELLANEOUS) ×3 IMPLANT
COVER WAND RF STERILE (DRAPES) ×1 IMPLANT
DRAPE LAPAROSCOPIC ABDOMINAL (DRAPES) ×3 IMPLANT
DRSG COVADERM 4X8 (GAUZE/BANDAGES/DRESSINGS) ×2 IMPLANT
DRSG COVADERM PLUS 2X2 (GAUZE/BANDAGES/DRESSINGS) ×2 IMPLANT
DRSG TEGADERM 4X4.75 (GAUZE/BANDAGES/DRESSINGS) ×4 IMPLANT
ELECT BLADE 6.5 EXT (BLADE) ×2 IMPLANT
ELECT REM PT RETURN 9FT ADLT (ELECTROSURGICAL) ×3
ELECTRODE REM PT RTRN 9FT ADLT (ELECTROSURGICAL) ×1 IMPLANT
GAUZE SPONGE 4X4 12PLY STRL (GAUZE/BANDAGES/DRESSINGS) ×2 IMPLANT
GLOVE BIO SURGEON STRL SZ 6 (GLOVE) ×5 IMPLANT
GLOVE INDICATOR 6.5 STRL GRN (GLOVE) ×5 IMPLANT
GOWN STRL REUS W/ TWL LRG LVL3 (GOWN DISPOSABLE) ×2 IMPLANT
GOWN STRL REUS W/TWL 2XL LVL3 (GOWN DISPOSABLE) ×6 IMPLANT
GOWN STRL REUS W/TWL LRG LVL3 (GOWN DISPOSABLE) ×6
HANDLE SUCTION POOLE (INSTRUMENTS) IMPLANT
HEMOSTAT SURGICEL 2X14 (HEMOSTASIS) IMPLANT
KIT BASIN OR (CUSTOM PROCEDURE TRAY) ×3 IMPLANT
KIT TURNOVER KIT B (KITS) ×3 IMPLANT
NS IRRIG 1000ML POUR BTL (IV SOLUTION) ×6 IMPLANT
PACK GENERAL/GYN (CUSTOM PROCEDURE TRAY) ×3 IMPLANT
PAD ARMBOARD 7.5X6 YLW CONV (MISCELLANEOUS) ×6 IMPLANT
PENCIL SMOKE EVACUATOR (MISCELLANEOUS) ×3 IMPLANT
RELOAD 45 VASCULAR/THIN (ENDOMECHANICALS) IMPLANT
RELOAD STAPLE 45 2.5 WHT GRN (ENDOMECHANICALS) IMPLANT
RELOAD STAPLER LINE PROX 60 GR (STAPLE) ×1 IMPLANT
SLEEVE SUCTION CATH 165 (SLEEVE) ×2 IMPLANT
SPECIMEN JAR X LARGE (MISCELLANEOUS) ×1 IMPLANT
SPONGE INTESTINAL PEANUT (DISPOSABLE) IMPLANT
SPONGE LAP 18X18 RF (DISPOSABLE) ×18 IMPLANT
STAPLE ECHEON FLEX 60 POW ENDO (STAPLE) ×1 IMPLANT
STAPLER RELOAD LINE PROX 60 GR (STAPLE) ×3
STAPLER RELOADABLE 60 GRN THCK (STAPLE) IMPLANT
STAPLER VISISTAT 35W (STAPLE) ×3 IMPLANT
SUCTION POOLE HANDLE (INSTRUMENTS) ×3
SUT ETHILON 2 0 FS 18 (SUTURE) ×4 IMPLANT
SUT NOVA 1 T20/GS 25DT (SUTURE) IMPLANT
SUT PDS AB 1 TP1 96 (SUTURE) ×4 IMPLANT
SUT PDS II 0 TP-1 LOOPED 60 (SUTURE) ×2 IMPLANT
SUT PROLENE 3 0 SH 48 (SUTURE) ×2 IMPLANT
SUT PROLENE 4 0 RB 1 (SUTURE) ×3
SUT PROLENE 4-0 RB1 .5 CRCL 36 (SUTURE) IMPLANT
SUT SILK 2 0 PERMA HAND 18 BK (SUTURE) ×2 IMPLANT
SUT SILK 2 0 REEL (SUTURE) IMPLANT
SUT SILK 2 0 SH CR/8 (SUTURE) ×4 IMPLANT
SUT SILK 2 0 TIES 10X30 (SUTURE) ×3 IMPLANT
SUT SILK 2 0SH CR/8 30 (SUTURE) ×2 IMPLANT
SUT SILK 3 0 SH CR/8 (SUTURE) ×2 IMPLANT
SUT VIC AB 2-0 CT1 36 (SUTURE) ×4 IMPLANT
TIP RIGID 35CM EVICEL (HEMOSTASIS) ×1 IMPLANT
TOWEL GREEN STERILE (TOWEL DISPOSABLE) ×1 IMPLANT
TOWEL GREEN STERILE FF (TOWEL DISPOSABLE) ×3 IMPLANT
TRAY FOLEY MTR SLVR 14FR STAT (SET/KITS/TRAYS/PACK) ×2 IMPLANT
TUNNELER SHEATH ON-Q 16GX12 DP (PAIN MANAGEMENT) ×2 IMPLANT

## 2019-06-07 NOTE — Anesthesia Procedure Notes (Signed)
Procedure Name: Intubation Date/Time: 06/07/2019 8:24 AM Performed by: Griffin Dakin, CRNA Pre-anesthesia Checklist: Patient identified, Emergency Drugs available, Suction available, Timeout performed and Patient being monitored Patient Re-evaluated:Patient Re-evaluated prior to induction Oxygen Delivery Method: Circle system utilized Preoxygenation: Pre-oxygenation with 100% oxygen Induction Type: IV induction Ventilation: Mask ventilation without difficulty and Oral airway inserted - appropriate to patient size Laryngoscope Size: Mac and 2 Grade View: Grade I Tube type: Oral Number of attempts: 1 Airway Equipment and Method: Stylet Placement Confirmation: ETT inserted through vocal cords under direct vision,  positive ETCO2,  CO2 detector and breath sounds checked- equal and bilateral Secured at: 22 cm Tube secured with: Tape Comments: Performed by Sena Slate, SRNA

## 2019-06-07 NOTE — Op Note (Signed)
PRE-OPERATIVE DIAGNOSIS: recurrent infected pancreatic pseudocyst  POST-OPERATIVE DIAGNOSIS:  Same  PROCEDURE:  Procedure(s): Distal pancreatectomy, splenectomy, debridement of pancreatic necrosis  SURGEON:  Surgeon(s): Stark Klein, MD  ASSISTANT(S):  Leighton Ruff, MD Fran Lowes, PA-S  ANESTHESIA:   general  DRAINS: Two 19 Fr blake drains.  Lateral drain in the subdiaphragmatic space.  Medial drain near pancreatic stump.    LOCAL MEDICATIONS USED:  NONE  SPECIMEN:  Source of Specimen:  spleen  Distal pancreas  DISPOSITION OF SPECIMEN:  PATHOLOGY  COUNTS:  YES  DICTATION: .Dragon Dictation  PLAN OF CARE: Admit to inpatient   PATIENT DISPOSITION:  PACU - guarded condition.  FINDINGS:  Massive gastric varices.  Firm necrotic distal pancreas and surrounding tissues with cheesy necrosis.  Giant spleen.    EBL: 1300 mL  PROCEDURE:   Patient was identified in the holding area and taken the operating room where he was placed supine on the operating room table.  General endotracheal anesthesia was induced.  A Foley catheter was placed.  His left arm was tucked.  His abdomen was prepped and draped in sterile fashion.  A timeout was performed according to the surgical safety checklist.  When all was correct, we continued.  A midline incision was made with a #10 blade in the upper abdomen.  The subcutaneous tissues were divided with the cautery.  The fascia was opened and elevated with Kocher clamps.  The peritoneum was then entered sharply.  The peritoneum, fascia, and subcutaneous tissues were opened of the length of the fascial incision.  At the lower portion of the incision, he had a previous incision and there was some omentum adherent to the posterior abdominal wall.  This was taken down with a combination of blunt dissection and sharp dissection.    The Bookwalter retractor was then set up to assist with visualization.  The stomach and omentum was elevated and the lesser  sac was entered between the stomach and the colon.  The varices around the stomach were around 10 mm in size.  These were avoided.  The colon was retracted inferiorly.  There was a small serosal defect seen after some of this dissection this was oversewn with 3-0 interrupted Vicryl.  The splenic flexure was taken down.  Spleen also was massive from the portal hypertension.  The spleen was dissected from the diaphragm.  The short gastric arteries were clamped and tied due to the size of them.  The spleen was then able to be elevated.  The splenic hilum was clamped, suture ligated, and then divided.  The spleen was passed off.  The distal pancreas with the pseudocyst was then elevated. The previous IR drain was removed after the tract was palpated.  The drain was entirely intact.    The pancreas was dissected away from the retroperitoneum, taking care to avoid the kidney/adrenal.  This was very inflamed and bloody due to the inflammation present.  The splenic vessels were divided more proximally in order to avoid bleeding once the pancreas was divided.  The pancreas was very firm.  A large kelly was used to clamp down on the pancreas to give a smaller profile for the stapler.  A TA 60 with a green load was used to staple across the pancreas.  This was then divided with a #10 blade.    The pancreatic stump was oversewn with a 2-0 silk suture.  Several of the varices on the stomach required figure of eight sutures for hemostasis.  The LUQ  was irrigated. Two drains were placed, with the lateral most drain in the subdiaphragmatic space and the medial drain was placed near the tail of the pancreas.  These were secured with 2-0 nylons.  Vistaseal was placed over the pancreatic stump.    The fascia was closed with #1 looped PDS sutures.  The skin was irrigated.  The skin was then closed with staples.  Soft sterile dressings were placed.    The patient was transfused and then taken to the PACU in stable condition.   Needle, sponge, and instrument counts are correct x 2.

## 2019-06-07 NOTE — Interval H&P Note (Signed)
History and Physical Interval Note:  06/07/2019 7:44 AM  Steve Anthony  has presented today for surgery, with the diagnosis of PANCREATIC PSEUDOCYST.  The various methods of treatment have been discussed with the patient and family. After consideration of risks, benefits and other options for treatment, the patient has consented to  Procedure(s): OPEN DISTAL PANCREATECTOMY (N/A) SPLENECTOMY (N/A) as a surgical intervention.  The patient's history has been reviewed, patient examined, no change in status, stable for surgery.  I have reviewed the patient's chart and labs.  Questions were answered to the patient's satisfaction.     Stark Klein

## 2019-06-07 NOTE — Anesthesia Postprocedure Evaluation (Signed)
Anesthesia Post Note  Patient: Steve Anthony  Procedure(s) Performed: OPEN DISTAL PANCREATECTOMY, DEBRIEDMENTOF PSEUDO PANCREATIC CYST (N/A Abdomen) SPLENECTOMY (N/A Abdomen)     Patient location during evaluation: PACU Anesthesia Type: General Level of consciousness: awake, oriented and patient cooperative Pain management: pain level controlled Vital Signs Assessment: post-procedure vital signs reviewed and stable Respiratory status: spontaneous breathing, nonlabored ventilation, respiratory function stable and patient connected to nasal cannula oxygen Cardiovascular status: blood pressure returned to baseline and stable Postop Assessment: no apparent nausea or vomiting Anesthetic complications: no Comments: Patient hemodynamically stable. Post-op TEG indicates coagulopathy due to inadequate platelets. Plan to transfuse 2 donor packs of platelets on 4N.    Last Vitals:  Vitals:   06/07/19 1600 06/07/19 1700  BP: 95/67   Pulse: 77 70  Resp: 16 17  Temp: 36.6 C   SpO2: 98% 100%    Last Pain:  Vitals:   06/07/19 1700  TempSrc:   PainSc: Asleep                 Costa Jha COKER

## 2019-06-07 NOTE — Anesthesia Procedure Notes (Signed)
Arterial Line Insertion Start/End5/13/2021 6:40 AM, 06/07/2019 6:50 AM Performed by: Brennan Bailey, MD, Griffin Dakin, CRNA  Patient location: Pre-op. Preanesthetic checklist: patient identified, IV checked, site marked, risks and benefits discussed, surgical consent, monitors and equipment checked, pre-op evaluation and timeout performed Lidocaine 1% used for infiltration radial was placed Catheter size: 20 G Hand hygiene performed , maximum sterile barriers used  and Seldinger technique used Allen's test indicative of satisfactory collateral circulation Attempts: 1 Procedure performed without using ultrasound guided technique. Following insertion, dressing applied and Biopatch. Post procedure assessment: normal  Patient tolerated the procedure well with no immediate complications. Additional procedure comments: Performed by Sena Slate, SRNA.

## 2019-06-07 NOTE — Transfer of Care (Signed)
Immediate Anesthesia Transfer of Care Note  Patient: Steve Anthony  Procedure(s) Performed: OPEN DISTAL PANCREATECTOMY, DEBRIEDMENTOF PSEUDO PANCREATIC CYST (N/A Abdomen) SPLENECTOMY (N/A Abdomen)  Patient Location: PACU  Anesthesia Type:General  Level of Consciousness: drowsy  Airway & Oxygen Therapy: Patient Spontanous Breathing and Patient connected to face mask oxygen  Post-op Assessment: Report given to RN and Post -op Vital signs reviewed and stable  Post vital signs: Reviewed and stable  Last Vitals:  Vitals Value Taken Time  BP 96/71 06/07/19 1206  Temp    Pulse 72 06/07/19 1210  Resp 15 06/07/19 1210  SpO2 100 % 06/07/19 1210  Vitals shown include unvalidated device data.  Last Pain:  Vitals:   06/07/19 0622  TempSrc:   PainSc: 6       Patients Stated Pain Goal: 4 (50/35/46 5681)  Complications: No apparent anesthesia complications

## 2019-06-08 LAB — TYPE AND SCREEN
ABO/RH(D): AB POS
Antibody Screen: NEGATIVE
Unit division: 0
Unit division: 0
Unit division: 0
Unit division: 0

## 2019-06-08 LAB — CBC
HCT: 28.9 % — ABNORMAL LOW (ref 39.0–52.0)
Hemoglobin: 9.5 g/dL — ABNORMAL LOW (ref 13.0–17.0)
MCH: 28.6 pg (ref 26.0–34.0)
MCHC: 32.9 g/dL (ref 30.0–36.0)
MCV: 87 fL (ref 80.0–100.0)
Platelets: UNDETERMINED 10*3/uL (ref 150–400)
RBC: 3.32 MIL/uL — ABNORMAL LOW (ref 4.22–5.81)
RDW: 17.2 % — ABNORMAL HIGH (ref 11.5–15.5)
WBC: 17.9 10*3/uL — ABNORMAL HIGH (ref 4.0–10.5)
nRBC: 0 % (ref 0.0–0.2)

## 2019-06-08 LAB — BPAM RBC
Blood Product Expiration Date: 202106042359
Blood Product Expiration Date: 202106042359
Blood Product Expiration Date: 202106042359
Blood Product Expiration Date: 202106042359
ISSUE DATE / TIME: 202105130940
ISSUE DATE / TIME: 202105130940
ISSUE DATE / TIME: 202105131128
ISSUE DATE / TIME: 202105131128
Unit Type and Rh: 6200
Unit Type and Rh: 6200
Unit Type and Rh: 6200
Unit Type and Rh: 6200

## 2019-06-08 LAB — BASIC METABOLIC PANEL
Anion gap: 5 (ref 5–15)
Anion gap: 6 (ref 5–15)
BUN: 10 mg/dL (ref 8–23)
BUN: 9 mg/dL (ref 8–23)
CO2: 22 mmol/L (ref 22–32)
CO2: 22 mmol/L (ref 22–32)
Calcium: 8.4 mg/dL — ABNORMAL LOW (ref 8.9–10.3)
Calcium: 8.1 mg/dL — ABNORMAL LOW (ref 8.9–10.3)
Chloride: 104 mmol/L (ref 98–111)
Chloride: 104 mmol/L (ref 98–111)
Creatinine, Ser: 1.21 mg/dL (ref 0.61–1.24)
Creatinine, Ser: 1 mg/dL (ref 0.61–1.24)
GFR calc Af Amer: >60 mL/min (ref >60)
GFR calc Af Amer: >60 mL/min (ref >60)
GFR calc non Af Amer: >60 mL/min (ref >60)
GFR calc non Af Amer: >60 mL/min (ref >60)
Glucose, Bld: 176 mg/dL — ABNORMAL HIGH (ref 70–99)
Glucose, Bld: 110 mg/dL — ABNORMAL HIGH (ref 70–99)
Potassium: 6 mmol/L — ABNORMAL HIGH (ref 3.5–5.1)
Potassium: 4.8 mmol/L (ref 3.5–5.1)
Sodium: 131 mmol/L — ABNORMAL LOW (ref 135–145)
Sodium: 132 mmol/L — ABNORMAL LOW (ref 135–145)

## 2019-06-08 LAB — GLUCOSE, CAPILLARY
Glucose-Capillary: 164 mg/dL — ABNORMAL HIGH (ref 70–99)
Glucose-Capillary: 136 mg/dL — ABNORMAL HIGH (ref 70–99)
Glucose-Capillary: 136 mg/dL — ABNORMAL HIGH (ref 70–99)
Glucose-Capillary: 93 mg/dL (ref 70–99)
Glucose-Capillary: 130 mg/dL — ABNORMAL HIGH (ref 70–99)
Glucose-Capillary: 125 mg/dL — ABNORMAL HIGH (ref 70–99)

## 2019-06-08 LAB — BPAM PLATELET PHERESIS
Blood Product Expiration Date: 202105142359
Blood Product Expiration Date: 202105152359
ISSUE DATE / TIME: 202105132046
Unit Type and Rh: 5100
Unit Type and Rh: 6200

## 2019-06-08 LAB — PREPARE PLATELET PHERESIS
Unit division: 0
Unit division: 0

## 2019-06-08 LAB — PREPARE FRESH FROZEN PLASMA

## 2019-06-08 LAB — PHOSPHORUS: Phosphorus: 3.7 mg/dL (ref 2.5–4.6)

## 2019-06-08 LAB — BPAM FFP
Blood Product Expiration Date: 202105182359
ISSUE DATE / TIME: 202105131035
Unit Type and Rh: 8400

## 2019-06-08 LAB — MAGNESIUM: Magnesium: 1.5 mg/dL — ABNORMAL LOW (ref 1.7–2.4)

## 2019-06-08 LAB — PROTIME-INR
INR: 1.1 (ref 0.8–1.2)
Prothrombin Time: 13.6 seconds (ref 11.4–15.2)

## 2019-06-08 MED ORDER — ALBUMIN HUMAN 5 % IV SOLN
25.0000 g | Freq: Once | INTRAVENOUS | Status: AC
  Administered 2019-06-08: 12.5 g via INTRAVENOUS
  Filled 2019-06-08: qty 500

## 2019-06-08 MED ORDER — DEXTROSE 50 % IV SOLN
1.0000 | Freq: Once | INTRAVENOUS | Status: AC
  Administered 2019-06-08: 50 mL via INTRAVENOUS
  Filled 2019-06-08: qty 50

## 2019-06-08 MED ORDER — ALBUMIN HUMAN 5 % IV SOLN
25.0000 g | Freq: Once | INTRAVENOUS | Status: AC
  Administered 2019-06-08: 25 g via INTRAVENOUS
  Filled 2019-06-08: qty 500

## 2019-06-08 MED ORDER — ADULT MULTIVITAMIN W/MINERALS CH
1.0000 | ORAL_TABLET | Freq: Every day | ORAL | Status: DC
  Administered 2019-06-08 – 2019-06-18 (×11): 1 via ORAL
  Filled 2019-06-08 (×11): qty 1

## 2019-06-08 MED ORDER — BOOST / RESOURCE BREEZE PO LIQD CUSTOM
1.0000 | Freq: Three times a day (TID) | ORAL | Status: DC
  Administered 2019-06-08 – 2019-06-09 (×4): 1 via ORAL

## 2019-06-08 MED ORDER — OXYCODONE HCL 5 MG PO TABS
10.0000 mg | ORAL_TABLET | ORAL | Status: DC
  Administered 2019-06-08 – 2019-06-09 (×7): 10 mg via ORAL
  Filled 2019-06-08 (×7): qty 2

## 2019-06-08 MED ORDER — INSULIN ASPART 100 UNIT/ML IV SOLN
10.0000 [IU] | Freq: Once | INTRAVENOUS | Status: AC
  Administered 2019-06-08: 10 [IU] via INTRAVENOUS

## 2019-06-08 MED ORDER — MAGNESIUM SULFATE 2 GM/50ML IV SOLN
2.0000 g | Freq: Once | INTRAVENOUS | Status: AC
  Administered 2019-06-08: 2 g via INTRAVENOUS
  Filled 2019-06-08: qty 50

## 2019-06-08 NOTE — Evaluation (Signed)
Physical Therapy Evaluation Patient Details Name: Steve Anthony MRN: [id number] DOB: 09/09/1950 Today's Date: 06/08/2019   History of Present Illness  69 year old male who presents with acute pancreatitis. Pt has a complicated history involving pancreatitis and pancreatic pseudocysts, starting in August 2019. Pt with most recent infection in February 2021 with aspiration which grew enterococcus fecalis. Pt with nausea and vomiting with attempts at eating/drinking. Pt underwent Distal pancreatectomy, splenectomy, debridement of pancreatic necrosis on 06/08/19.  Clinical Impression  PT presents to PT with deficits in gait, balance, functional mobility, endurance, strength, power, and with significant pain despite PCA pump and other pain medications. PT evaluation limited by orthostatic hypotension as listed below in general comments. Pt mobilizes well in bed and is able to march at edge of bed with minA and support of IV pole. Pt will benefit from continued aggressive mobilization and PT POC to improve activity tolerance and to reduce falls risk. PT anticipates pt will progress quickly with mobility and be able to discharge home with HHPT and assistance from spouse when medically appropriate.    Follow Up Recommendations Home health PT;Supervision/Assistance - 24 hour(with progression of mobility)    Equipment Recommendations  None recommended by PT(pt owns necessary DME)    Recommendations for Other Services       Precautions / Restrictions Precautions Precautions: Fall;Other (comment) Precaution Comments: 2 drains, Q pain ball, PCA pump Restrictions Weight Bearing Restrictions: No      Mobility  Bed Mobility Overal bed mobility: Needs Assistance Bed Mobility: Sit to Supine;Supine to Sit     Supine to sit: Min guard Sit to supine: Min assist      Transfers Overall transfer level: Needs assistance Equipment used: None Transfers: Sit to/from Stand Sit to Stand: Min guard             Ambulation/Gait Ambulation/Gait assistance: Herbalist (Feet): 0 Feet(marching in place) Assistive device: IV Pole Gait Pattern/deviations: Step-to pattern Gait velocity: 0 Gait velocity interpretation: <1.31 ft/sec, indicative of household ambulator General Gait Details: pt marching in place at edge of bed, increased sway, no significant LOB  Stairs            Wheelchair Mobility    Modified Rankin (Stroke Patients Only)       Balance Overall balance assessment: Needs assistance Sitting-balance support: Single extremity supported;Feet supported Sitting balance-Leahy Scale: Fair Sitting balance - Comments: minG at edge of bed with UE support   Standing balance support: Single extremity supported Standing balance-Leahy Scale: Poor Standing balance comment: minA with unilateral UE support of IV pole                             Pertinent Vitals/Pain Pain Assessment: 0-10 Pain Score: 9  Pain Location: abdomen Pain Descriptors / Indicators: Grimacing Pain Intervention(s): Monitored during session    Home Living Family/patient expects to be discharged to:: Private residence Living Arrangements: Spouse/significant other Available Help at Discharge: Family Type of Home: Mobile home Home Access: Ramped entrance     Home Layout: One level Home Equipment: Bedside commode;Walker - 2 wheels;Cane - single point;Walker - 4 wheels;Shower seat;Grab bars - tub/shower;Grab bars - toilet;Hand held shower head;Wheelchair - Insurance risk surveyor Comments: x2 dogs (jack russel and fox walker)     Prior Function Level of Independence: Independent         Comments: hx of back surgeries x3     Hand Dominance  Dominant Hand: Right    Extremity/Trunk Assessment   Upper Extremity Assessment Upper Extremity Assessment: Overall WFL for tasks assessed    Lower Extremity Assessment Lower Extremity Assessment: Generalized  weakness    Cervical / Trunk Assessment Cervical / Trunk Assessment: Normal  Communication   Communication: No difficulties  Cognition Arousal/Alertness: Awake/alert Behavior During Therapy: Flat affect Overall Cognitive Status: Within Functional Limits for tasks assessed                                        General Comments General comments (skin integrity, edema, etc.): BP pre-mobility 83/73 (77), BP standing 55/44 (50), BP lying supine 86/57 (67). Pt reports dizziness and lightheadedness with standing, symtpoms improve with return to supine    Exercises     Assessment/Plan    PT Assessment Patient needs continued PT services  PT Problem List Decreased strength;Decreased activity tolerance;Decreased balance;Decreased mobility;Decreased knowledge of use of DME;Decreased safety awareness;Decreased knowledge of precautions;Pain       PT Treatment Interventions DME instruction;Gait training;Stair training;Functional mobility training;Therapeutic activities;Therapeutic exercise;Balance training;Neuromuscular re-education;Patient/family education    PT Goals (Current goals can be found in the Care Plan section)  Acute Rehab PT Goals Patient Stated Goal: To improve mobility and reduce pain PT Goal Formulation: With patient/family Time For Goal Achievement: 06/22/19 Potential to Achieve Goals: Good Additional Goals Additional Goal #1: Pt will maintain dynamic standing balance within 10 inches of her base of support with supervision, no UE support    Frequency Min 3X/week   Barriers to discharge        Co-evaluation PT/OT/SLP Co-Evaluation/Treatment: Yes Reason for Co-Treatment: Complexity of the patient's impairments (multi-system involvement);For patient/therapist safety;To address functional/ADL transfers PT goals addressed during session: Mobility/safety with mobility;Balance;Strengthening/ROM OT goals addressed during session: ADL's and self-care;Proper  use of Adaptive equipment and DME;Strengthening/ROM       AM-PAC PT "6 Clicks" Mobility  Outcome Measure Help needed turning from your back to your side while in a flat bed without using bedrails?: A Little Help needed moving from lying on your back to sitting on the side of a flat bed without using bedrails?: A Little Help needed moving to and from a bed to a chair (including a wheelchair)?: A Little Help needed standing up from a chair using your arms (e.g., wheelchair or bedside chair)?: A Little Help needed to walk in hospital room?: A Lot Help needed climbing 3-5 steps with a railing? : Total 6 Click Score: 15    End of Session Equipment Utilized During Treatment: Oxygen Activity Tolerance: Treatment limited secondary to medical complications (Comment)(orthostatic hypotension) Patient left: in bed;with call bell/phone within reach;with bed alarm set;with family/visitor present Nurse Communication: Mobility status PT Visit Diagnosis: Unsteadiness on feet (R26.81);Other abnormalities of gait and mobility (R26.89);Muscle weakness (generalized) (M62.81);Pain Pain - Right/Left: (abdomen)    Time: 3212-2482 PT Time Calculation (min) (ACUTE ONLY): 24 min   Charges:   PT Evaluation $PT Eval Moderate Complexity: 1 Mod          Zenaida Niece, PT, DPT Acute Rehabilitation Pager: [phone]   Zenaida Niece 06/08/2019, 12:02 PM

## 2019-06-08 NOTE — Evaluation (Signed)
Occupational Therapy Evaluation Patient Details Name: Steve Anthony MRN: [id number] DOB: 1950-10-07 Today's Date: 06/08/2019    History of Present Illness 69 year old male who presents with acute pancreatitis. Pt has a complicated history involving pancreatitis and pancreatic pseudocysts, starting in August 2019. Pt with most recent infection in February 2021 with aspiration which grew enterococcus fecalis. Pt with nausea and vomiting with attempts at eating/drinking. Pt underwent Distal pancreatectomy, splenectomy, debridement of pancreatic necrosis on 06/08/19.   Clinical Impression   PT admitted with s/p splenectomy , pancreatectomy and debridement of pancreatic necrosis. Pt currently with functional limitiations due to the deficits listed below (see OT problem list). Pt currently limited by pain and decreased BP with dizzziness. Pt noted to have drainage on gown at belly button level. Rn made aware of drainage from lower aspect of dressing site.  Pt will benefit from skilled OT to increase their independence and safety with adls and balance to allow discharge home.     Follow Up Recommendations  Home health OT    Equipment Recommendations  None recommended by OT    Recommendations for Other Services       Precautions / Restrictions Precautions Precautions: Fall;Other (comment) Precaution Comments: 2 drains, Q pain ball, PCA pump Restrictions Weight Bearing Restrictions: No      Mobility Bed Mobility Overal bed mobility: Needs Assistance Bed Mobility: Sit to Supine;Supine to Sit     Supine to sit: Min guard Sit to supine: Min assist      Transfers Overall transfer level: Needs assistance Equipment used: None Transfers: Sit to/from Stand Sit to Stand: Min guard              Balance Overall balance assessment: Needs assistance Sitting-balance support: Single extremity supported;Feet supported Sitting balance-Leahy Scale: Fair Sitting balance - Comments: minG  at edge of bed with UE support   Standing balance support: Single extremity supported Standing balance-Leahy Scale: Poor Standing balance comment: minA with unilateral UE support of IV pole                           ADL either performed or assessed with clinical judgement   ADL Overall ADL's : Needs assistance/impaired     Grooming: Minimal assistance;Bed level   Upper Body Bathing: Moderate assistance;Bed level   Lower Body Bathing: Moderate assistance;Sit to/from stand           Toilet Transfer: Min guard             General ADL Comments: pt limited by lines x2 iv poles, x3 drains/ ball in pouch. pt once eob with decreased BP and return to supine     Vision Baseline Vision/History: Wears glasses Wears Glasses: Reading only       Perception     Praxis      Pertinent Vitals/Pain Pain Assessment: 0-10 Pain Score: 9  Pain Location: abdomen Pain Descriptors / Indicators: Grimacing Pain Intervention(s): Monitored during session;Premedicated before session;Repositioned     Hand Dominance Right   Extremity/Trunk Assessment Upper Extremity Assessment Upper Extremity Assessment: Overall WFL for tasks assessed   Lower Extremity Assessment Lower Extremity Assessment: Defer to PT evaluation   Cervical / Trunk Assessment Cervical / Trunk Assessment: Normal   Communication Communication Communication: No difficulties   Cognition Arousal/Alertness: Awake/alert Behavior During Therapy: Flat affect Overall Cognitive Status: Within Functional Limits for tasks assessed  General Comments  BP pre-mobility 83/73 (77), BP standing 55/44 (50), BP lying supine 86/57 (67). Pt reports dizziness and lightheadedness with standing, symtpoms improve with return to supine. pt on 2L oxygen    Exercises     Shoulder Instructions      Home Living Family/patient expects to be discharged to:: Private  residence Living Arrangements: Spouse/significant other Available Help at Discharge: Family Type of Home: Mobile home Home Access: Ramped entrance     Home Layout: One level     Bathroom Shower/Tub: Occupational psychologist: Standard     Home Equipment: Bedside commode;Walker - 2 wheels;Cane - single point;Walker - 4 wheels;Shower seat;Grab bars - tub/shower;Grab bars - toilet;Hand held shower head;Wheelchair - Scientist, physiological: Reacher;Long-handled English as a second language teacher Comments: x2 dogs (jack russel and fox walker)       Prior Functioning/Environment Level of Independence: Independent        Comments: hx of back surgeries x3        OT Problem List: Decreased strength;Decreased activity tolerance;Impaired balance (sitting and/or standing);Decreased safety awareness;Decreased knowledge of use of DME or AE;Decreased knowledge of precautions;Cardiopulmonary status limiting activity;Pain      OT Treatment/Interventions: Self-care/ADL training;Therapeutic exercise;Energy conservation;DME and/or AE instruction;Manual therapy;Modalities;Therapeutic activities;Patient/family education;Balance training    OT Goals(Current goals can be found in the care plan section) Acute Rehab OT Goals Patient Stated Goal: To improve mobility and reduce pain OT Goal Formulation: With patient Time For Goal Achievement: 06/22/19 Potential to Achieve Goals: Good  OT Frequency: Min 3X/week   Barriers to D/C:            Co-evaluation PT/OT/SLP Co-Evaluation/Treatment: Yes Reason for Co-Treatment: Complexity of the patient's impairments (multi-system involvement);Necessary to address cognition/behavior during functional activity;For patient/therapist safety;To address functional/ADL transfers PT goals addressed during session: Mobility/safety with mobility;Balance;Strengthening/ROM OT goals addressed during session: ADL's and self-care;Proper use of Adaptive  equipment and DME;Strengthening/ROM      AM-PAC OT "6 Clicks" Daily Activity     Outcome Measure Help from another person eating meals?: A Little Help from another person taking care of personal grooming?: A Little Help from another person toileting, which includes using toliet, bedpan, or urinal?: A Little Help from another person bathing (including washing, rinsing, drying)?: A Little Help from another person to put on and taking off regular upper body clothing?: A Little Help from another person to put on and taking off regular lower body clothing?: A Little 6 Click Score: 18   End of Session Equipment Utilized During Treatment: Oxygen Nurse Communication: Mobility status;Precautions  Activity Tolerance: Patient tolerated treatment well Patient left: in bed;with call bell/phone within reach;with bed alarm set;with family/visitor present  OT Visit Diagnosis: Unsteadiness on feet (R26.81);Muscle weakness (generalized) (M62.81);Pain                Time: 1122-1212 OT Time Calculation (min): 50 min Charges:  OT General Charges $OT Visit: 1 Visit OT Evaluation $OT Eval Moderate Complexity: 1 Mod   Brynn, OTR/L  Acute Rehabilitation Services Pager: [phone] Office: [phone] .   Jeri Modena 06/08/2019, 2:45 PM

## 2019-06-08 NOTE — Progress Notes (Addendum)
1 Day Post-Op   Subjective/Chief Complaint: Main complaint is shoulder pain.  No n/v.     Objective: Vital signs in last 24 hours: Temp:  [97 F (36.1 C)-98.2 F (36.8 C)] 97.7 F (36.5 C) (05/14 0800) Pulse Rate:  [53-77] 73 (05/14 0600) Resp:  [12-26] 16 (05/14 0835) BP: (88-166)/(55-95) 100/74 (05/14 0600) SpO2:  [96 %-100 %] 99 % (05/14 0835) Arterial Line BP: (74-148)/(46-94) 119/64 (05/14 0600) Weight:  [95 kg] 95 kg (05/13 1500)    Intake/Output from previous day: 05/13 0701 - 05/14 0700 In: 7988.3 [I.V.:5196.3; WUGQB:1694; IV Piggyback:1300] Out: 4330 [Urine:2550; Drains:280; Blood:1500] Intake/Output this shift: No intake/output data recorded.  General appearance: alert, cooperative and mild distress Resp: breathing comfortably Cardio: regular rate and rhythm GI: soft, non distended, approp tender.  drains serosang.  dressing c/d/i.  OnQ in place.   Extremities: extremities normal, atraumatic, no cyanosis or edema  Lab Results:  Recent Labs    06/07/19 1215 06/08/19 0431  WBC 11.5* 17.9*  HGB 9.8* 9.5*  HCT 32.0* 28.9*  PLT 94* PLATELET CLUMPS NOTED ON SMEAR, UNABLE TO ESTIMATE   BMET Recent Labs    06/07/19 1215 06/08/19 0431  NA 133* 131*  K 5.7* 6.0*  CL 110 104  CO2 17* 22  GLUCOSE 219* 176*  BUN 14 10  CREATININE 1.51* 1.21  CALCIUM 7.8* 8.4*   PT/INR Recent Labs    06/07/19 1215 06/08/19 0431  LABPROT 16.4* 13.6  INR 1.4* 1.1   ABG Recent Labs    06/07/19 0945 06/07/19 1035  PHART 7.253* 7.265*  HCO3 20.6 19.7*    Studies/Results: No results found.  Anti-infectives: Anti-infectives (From admission, onward)   Start     Dose/Rate Route Frequency Ordered Stop   06/07/19 1400  ciprofloxacin (CIPRO) IVPB 400 mg     400 mg 200 mL/hr over 60 Minutes Intravenous Every 12 hours 06/07/19 1356 06/07/19 1649   06/07/19 0600  ciprofloxacin (CIPRO) IVPB 400 mg     400 mg 200 mL/hr over 60 Minutes Intravenous On call to O.R.  06/07/19 5038 06/07/19 0806   06/07/19 0558  ciprofloxacin (CIPRO) 400 MG/200ML IVPB    Note to Pharmacy: Marga Melnick   : cabinet override      06/07/19 0558 06/07/19 0834      Assessment/Plan: s/p Procedure(s): OPEN DISTAL PANCREATECTOMY, DEBRIEDMENTOF PSEUDO PANCREATIC CYST (N/A) SPLENECTOMY (N/A) keep foley due to urinary output monitoring,.  ABL anemia - does not need transfusion this AM. OnQ, PCA for acute pain.  Has oxy q 4 hours for chronic pain. PRN robaxin Hemorrhagic shock- weaning off neo. D/c NGT and start clear liquids. PT consult. Hyperkalemia - D50 and insulin.  No k in IV fluids.       LOS: 1 day    Steve Anthony 06/08/2019

## 2019-06-08 NOTE — Progress Notes (Signed)
Initial Nutrition Assessment  DOCUMENTATION CODES:   Not applicable  INTERVENTION:   -Boost Breeze po TID, each supplement provides 250 kcal and 9 grams of protein -MVI with minerals daily -RD will follow for diet advancement and adjust supplement regimen as appropriate  NUTRITION DIAGNOSIS:   Increased nutrient needs related to post-op healing as evidenced by estimated needs.  GOAL:   Patient will meet greater than or equal to 90% of their needs  MONITOR:   PO intake, Supplement acceptance, Diet advancement, Labs, Skin, Weight trends, I & O's  REASON FOR ASSESSMENT:   Malnutrition Screening Tool    ASSESSMENT:   The patient is a 69 year old male who presents with acute pancreatitis. Pt has a complicated history involving pancreatitis and pancreatic pseudocysts.  He has been admitted for this several times in the last two years.  It appears that the pancreatitis started in August 2019.  Pt admitted with recurrent infected pancreatic pseudocyst.   5/13- s/p Procedure(s): Distal pancreatectomy, splenectomy, debridement of pancreatic necrosis 5/14- NGT d/c, advanced to clear liquid diet   Reviewed I/O's: +3.7 L x 24 hours  UOP: 2.6 L x 24 hours  Drain output: 280 ml x 24 hours  Attempted to speak with pt, however, working with therapy at time of visit. Unable to obtain further nutrition history or complete nutrition-focused physical exam at this time.   Per H&P, highly suspect prolonged oral intake, as pt has been unable to tolerate anything other than clear liquids PTA.   NGT has been removed and just advanced to clear liquid diet. No meal completion data available to assess at this time.   Reviewed wt hx; pt has experienced a 9.6% wt loss over the past 3 months, which is significant for time frame. Highly suspect pt with malnutrition, however, RD unable to identify at this time.   Medications reviewed and include dilaudid, neosynephrine,  and dextrose 5%-0.45%  sodium chloride infusion @ 100 ml/hr.   Labs reviewed: Na: 131, Mg: 1.5, CBGS: 136-237 (inpatient orders for glycemic control are 0-9 units inuslin aspart every 4 hours).   Diet Order:   Diet Order            Diet clear liquid Room service appropriate? Yes with Assist; Fluid consistency: Thin  Diet effective now              EDUCATION NEEDS:   No education needs have been identified at this time  Skin:  Skin Assessment: Skin Integrity Issues: Skin Integrity Issues:: Incisions Incisions: closed abdomen  Last BM:  Unknown  Height:   Ht Readings from Last 1 Encounters:  06/07/19 6' (1.829 m)    Weight:   Wt Readings from Last 1 Encounters:  06/07/19 95 kg    Ideal Body Weight:  80.9 kg  BMI:  Body mass index is 28.4 kg/m.  Estimated Nutritional Needs:   Kcal:  2400-2600  Protein:  125-140 grams  Fluid:  > 2.4 L    Loistine Chance, RD, LDN, [place] Registered Dietitian II Certified Diabetes Care and Education Specialist Please refer to [facility] for RD and/or RD on-call/weekend/after hours pager

## 2019-06-09 LAB — CBC
HCT: 24.1 % — ABNORMAL LOW (ref 39.0–52.0)
HCT: 25.9 % — ABNORMAL LOW (ref 39.0–52.0)
Hemoglobin: 7.6 g/dL — ABNORMAL LOW (ref 13.0–17.0)
Hemoglobin: 8.3 g/dL — ABNORMAL LOW (ref 13.0–17.0)
MCH: 27.7 pg (ref 26.0–34.0)
MCH: 28.2 pg (ref 26.0–34.0)
MCHC: 31.5 g/dL (ref 30.0–36.0)
MCHC: 32 g/dL (ref 30.0–36.0)
MCV: 88 fL (ref 80.0–100.0)
MCV: 88.1 fL (ref 80.0–100.0)
Platelets: 93 10*3/uL — ABNORMAL LOW (ref 150–400)
Platelets: UNDETERMINED 10*3/uL (ref 150–400)
RBC: 2.74 MIL/uL — ABNORMAL LOW (ref 4.22–5.81)
RBC: 2.94 MIL/uL — ABNORMAL LOW (ref 4.22–5.81)
RDW: 17.3 % — ABNORMAL HIGH (ref 11.5–15.5)
RDW: 17.2 % — ABNORMAL HIGH (ref 11.5–15.5)
WBC: 13.1 10*3/uL — ABNORMAL HIGH (ref 4.0–10.5)
WBC: 12.7 10*3/uL — ABNORMAL HIGH (ref 4.0–10.5)
nRBC: 0.2 % (ref 0.0–0.2)
nRBC: 0.2 % (ref 0.0–0.2)

## 2019-06-09 LAB — BASIC METABOLIC PANEL
Anion gap: 5 (ref 5–15)
Anion gap: 7 (ref 5–15)
BUN: 7 mg/dL — ABNORMAL LOW (ref 8–23)
BUN: 6 mg/dL — ABNORMAL LOW (ref 8–23)
CO2: 23 mmol/L (ref 22–32)
CO2: 25 mmol/L (ref 22–32)
Calcium: 8.1 mg/dL — ABNORMAL LOW (ref 8.9–10.3)
Calcium: 8.3 mg/dL — ABNORMAL LOW (ref 8.9–10.3)
Chloride: 103 mmol/L (ref 98–111)
Chloride: 99 mmol/L (ref 98–111)
Creatinine, Ser: 1 mg/dL (ref 0.61–1.24)
Creatinine, Ser: 0.99 mg/dL (ref 0.61–1.24)
GFR calc Af Amer: >60 mL/min (ref >60)
GFR calc Af Amer: >60 mL/min (ref >60)
GFR calc non Af Amer: >60 mL/min (ref >60)
GFR calc non Af Amer: >60 mL/min (ref >60)
Glucose, Bld: 137 mg/dL — ABNORMAL HIGH (ref 70–99)
Glucose, Bld: 183 mg/dL — ABNORMAL HIGH (ref 70–99)
Potassium: 4.5 mmol/L (ref 3.5–5.1)
Potassium: 4.1 mmol/L (ref 3.5–5.1)
Sodium: 131 mmol/L — ABNORMAL LOW (ref 135–145)
Sodium: 131 mmol/L — ABNORMAL LOW (ref 135–145)

## 2019-06-09 LAB — PHOSPHORUS
Phosphorus: 2.2 mg/dL — ABNORMAL LOW (ref 2.5–4.6)
Phosphorus: 4 mg/dL (ref 2.5–4.6)

## 2019-06-09 LAB — GLUCOSE, CAPILLARY
Glucose-Capillary: 150 mg/dL — ABNORMAL HIGH (ref 70–99)
Glucose-Capillary: 155 mg/dL — ABNORMAL HIGH (ref 70–99)
Glucose-Capillary: 125 mg/dL — ABNORMAL HIGH (ref 70–99)
Glucose-Capillary: 162 mg/dL — ABNORMAL HIGH (ref 70–99)
Glucose-Capillary: 121 mg/dL — ABNORMAL HIGH (ref 70–99)

## 2019-06-09 LAB — MAGNESIUM
Magnesium: 1.4 mg/dL — ABNORMAL LOW (ref 1.7–2.4)
Magnesium: 1.8 mg/dL (ref 1.7–2.4)

## 2019-06-09 MED ORDER — MAGNESIUM SULFATE 4 GM/100ML IV SOLN
4.0000 g | Freq: Once | INTRAVENOUS | Status: AC
  Administered 2019-06-09: 4 g via INTRAVENOUS
  Filled 2019-06-09: qty 100

## 2019-06-09 MED ORDER — PHENYLEPHRINE HCL-NACL 10-0.9 MG/250ML-% IV SOLN
INTRAVENOUS | Status: AC
  Filled 2019-06-09: qty 250

## 2019-06-09 MED ORDER — OXYCODONE HCL 5 MG PO TABS
10.0000 mg | ORAL_TABLET | ORAL | Status: DC | PRN
  Administered 2019-06-09 – 2019-06-10 (×2): 10 mg via ORAL
  Administered 2019-06-10: 5 mg via ORAL
  Administered 2019-06-10 – 2019-06-18 (×32): 10 mg via ORAL
  Filled 2019-06-09 (×35): qty 2

## 2019-06-09 MED ORDER — TRAMADOL HCL 50 MG PO TABS
50.0000 mg | ORAL_TABLET | Freq: Four times a day (QID) | ORAL | Status: DC | PRN
  Administered 2019-06-09 – 2019-06-12 (×4): 100 mg via ORAL
  Filled 2019-06-09 (×4): qty 2

## 2019-06-09 MED ORDER — PHENYLEPHRINE CONCENTRATED 100MG/250ML (0.4 MG/ML) INFUSION SIMPLE
0.0000 ug/min | INTRAVENOUS | Status: DC
  Administered 2019-06-09: 30 ug/min via INTRAVENOUS
  Filled 2019-06-09 (×2): qty 250

## 2019-06-09 MED ORDER — METHOCARBAMOL 500 MG PO TABS
500.0000 mg | ORAL_TABLET | Freq: Three times a day (TID) | ORAL | Status: DC | PRN
  Administered 2019-06-09 – 2019-06-11 (×3): 500 mg via ORAL
  Filled 2019-06-09 (×3): qty 1

## 2019-06-09 MED ORDER — SODIUM PHOSPHATES 45 MMOLE/15ML IV SOLN
30.0000 mmol | Freq: Once | INTRAVENOUS | Status: AC
  Administered 2019-06-09: 30 mmol via INTRAVENOUS
  Filled 2019-06-09: qty 10

## 2019-06-09 NOTE — Significant Event (Addendum)
Patient has started to become intermittently confused. Noted by family as well.   Spoke with Dr. Bobbye Morton regarding this as well as hypotension since having gotten patient up to the chair; Neo drip nitrated up.   Received new orders to stop PCA, and add PRN pain medications to oxycodone Q4H, tramadol, and PO robaxin. Patient and family made aware of the changes.   Janalyn Higby

## 2019-06-09 NOTE — Progress Notes (Signed)
Trauma/Critical Care Follow Up Note  Subjective:    Overnight Issues:   Objective:  Vital signs for last 24 hours: Temp:  [98 F (36.7 C)-99.3 F (37.4 C)] 99.3 F (37.4 C) (05/15 0000) Pulse Rate:  [40-101] 68 (05/15 0800) Resp:  [11-23] 16 (05/15 0838) BP: (55-130)/(44-88) 105/72 (05/15 0800) SpO2:  [86 %-100 %] 96 % (05/15 0838) Arterial Line BP: (64-137)/(38-89) 136/63 (05/15 0800)  Hemodynamic parameters for last 24 hours:    Intake/Output from previous day: 05/14 0701 - 05/15 0700 In: 5621.2 [P.O.:570; I.V.:4680.7; IV Piggyback:370.5] Out: 9563 [OVFIE:3329; Drains:150]  Intake/Output this shift: Total I/O In: 164.5 [I.V.:164.5] Out: 0   Vent settings for last 24 hours:    Physical Exam:  Gen: comfortable, no distress Neuro: non-focal exam HEENT: PERRL Neck: supple CV: RRR Pulm: unlabored breathing Abd: soft, appropriatelyTTP, incision c/d/i with staples, JP x2 on left with SS drainage, on-q present GU: clear yellow urine, foley Extr: wwp, trace edema  Results for orders placed or performed during the hospital encounter of 06/07/19 (from the past 24 hour(s))  Glucose, capillary     Status: Abnormal   Collection Time: 06/08/19 11:26 AM  Result Value Ref Range   Glucose-Capillary 136 (H) 70 - 99 mg/dL  Glucose, capillary     Status: None   Collection Time: 06/08/19  3:36 PM  Result Value Ref Range   Glucose-Capillary 93 70 - 99 mg/dL  Basic metabolic panel     Status: Abnormal   Collection Time: 06/08/19  3:52 PM  Result Value Ref Range   Sodium 132 (L) 135 - 145 mmol/L   Potassium 4.8 3.5 - 5.1 mmol/L   Chloride 104 98 - 111 mmol/L   CO2 22 22 - 32 mmol/L   Glucose, Bld 110 (H) 70 - 99 mg/dL   BUN 9 8 - 23 mg/dL   Creatinine, Ser 1.00 0.61 - 1.24 mg/dL   Calcium 8.1 (L) 8.9 - 10.3 mg/dL   GFR calc non Af Amer >60 >60 mL/min   GFR calc Af Amer >60 >60 mL/min   Anion gap 6 5 - 15  Glucose, capillary     Status: Abnormal   Collection Time:  06/08/19  7:10 PM  Result Value Ref Range   Glucose-Capillary 130 (H) 70 - 99 mg/dL  Glucose, capillary     Status: Abnormal   Collection Time: 06/08/19 11:25 PM  Result Value Ref Range   Glucose-Capillary 125 (H) 70 - 99 mg/dL  Basic metabolic panel     Status: Abnormal   Collection Time: 06/09/19  4:23 AM  Result Value Ref Range   Sodium 131 (L) 135 - 145 mmol/L   Potassium 4.5 3.5 - 5.1 mmol/L   Chloride 103 98 - 111 mmol/L   CO2 23 22 - 32 mmol/L   Glucose, Bld 137 (H) 70 - 99 mg/dL   BUN 7 (L) 8 - 23 mg/dL   Creatinine, Ser 1.00 0.61 - 1.24 mg/dL   Calcium 8.1 (L) 8.9 - 10.3 mg/dL   GFR calc non Af Amer >60 >60 mL/min   GFR calc Af Amer >60 >60 mL/min   Anion gap 5 5 - 15  Magnesium     Status: Abnormal   Collection Time: 06/09/19  4:23 AM  Result Value Ref Range   Magnesium 1.4 (L) 1.7 - 2.4 mg/dL  Phosphorus     Status: Abnormal   Collection Time: 06/09/19  4:23 AM  Result Value Ref Range   Phosphorus  2.2 (L) 2.5 - 4.6 mg/dL  CBC     Status: Abnormal   Collection Time: 06/09/19  4:23 AM  Result Value Ref Range   WBC 13.1 (H) 4.0 - 10.5 K/uL   RBC 2.74 (L) 4.22 - 5.81 MIL/uL   Hemoglobin 7.6 (L) 13.0 - 17.0 g/dL   HCT 24.1 (L) 39.0 - 52.0 %   MCV 88.0 80.0 - 100.0 fL   MCH 27.7 26.0 - 34.0 pg   MCHC 31.5 30.0 - 36.0 g/dL   RDW 17.3 (H) 11.5 - 15.5 %   Platelets 93 (L) 150 - 400 K/uL   nRBC 0.2 0.0 - 0.2 %  Glucose, capillary     Status: Abnormal   Collection Time: 06/09/19  7:40 AM  Result Value Ref Range   Glucose-Capillary 150 (H) 70 - 99 mg/dL    Assessment & Plan: The plan of care was discussed with the bedside nurse for the day, who is in agreement with this plan and no additional concerns were raised.   Present on Admission: . Pancreatic pseudocyst . Infected pancreatic pseudocyst    LOS: 2 days   Additional comments:I reviewed the patient's new clinical lab test results.   and I reviewed the patients new imaging test results.    s/p  Procedure(s): OPEN DISTAL PANCREATECTOMY, DEBRIEDMENTOF PSEUDO PANCREATIC CYST (N/A) SPLENECTOMY (N/A) Foley out today, excellent UOP  ABL anemia - continue to monitor, recheck this PM Thrombocytopenia - continue to monitor, recheck this PM OnQ, PCA for acute pain.  Has oxy q 4 hours for chronic pain. PRN robaxin Hemorrhagic shock - continue weaning off neo FEN - continue CLD until flatus, replete hypomagnesemia and hypophosphatemia. Received D50 and insulin for hyperkalemia yesterday, improved today. Recheck lytes this PM PT/OT on board, continue, OOB to chair today DVT - SCDs, no lovenox with hgb drop and thrombocytopenia  Critical Care Total Time: 35 minutes  Jesusita Oka, MD Trauma & General Surgery Please use [email or web address] to contact on call provider  06/09/2019  *Care during the described time interval was provided by me. I have reviewed this patient's available data, including medical history, events of note, physical examination and test results as part of my evaluation.

## 2019-06-09 NOTE — Significant Event (Signed)
Will collect BMP scheduled for 1600pm later after completion of   sodium phosphate 30 mmol.          Steve Anthony

## 2019-06-09 NOTE — Significant Event (Signed)
Wasted approximately 40cc of dilaudid in sink with charge RN Anderson Malta as witness.     Steve Anthony

## 2019-06-10 LAB — BASIC METABOLIC PANEL
Anion gap: 7 (ref 5–15)
BUN: 5 mg/dL — ABNORMAL LOW (ref 8–23)
CO2: 24 mmol/L (ref 22–32)
Calcium: 8.5 mg/dL — ABNORMAL LOW (ref 8.9–10.3)
Chloride: 102 mmol/L (ref 98–111)
Creatinine, Ser: 1.04 mg/dL (ref 0.61–1.24)
GFR calc Af Amer: >60 mL/min (ref >60)
GFR calc non Af Amer: >60 mL/min (ref >60)
Glucose, Bld: 127 mg/dL — ABNORMAL HIGH (ref 70–99)
Potassium: 3.9 mmol/L (ref 3.5–5.1)
Sodium: 133 mmol/L — ABNORMAL LOW (ref 135–145)

## 2019-06-10 LAB — CBC
HCT: 25.6 % — ABNORMAL LOW (ref 39.0–52.0)
Hemoglobin: 8.3 g/dL — ABNORMAL LOW (ref 13.0–17.0)
MCH: 28.3 pg (ref 26.0–34.0)
MCHC: 32.4 g/dL (ref 30.0–36.0)
MCV: 87.4 fL (ref 80.0–100.0)
Platelets: 201 10*3/uL (ref 150–400)
RBC: 2.93 MIL/uL — ABNORMAL LOW (ref 4.22–5.81)
RDW: 16.9 % — ABNORMAL HIGH (ref 11.5–15.5)
WBC: 11.5 10*3/uL — ABNORMAL HIGH (ref 4.0–10.5)
nRBC: 0.2 % (ref 0.0–0.2)

## 2019-06-10 LAB — PHOSPHORUS: Phosphorus: 3.4 mg/dL (ref 2.5–4.6)

## 2019-06-10 LAB — MAGNESIUM: Magnesium: 1.6 mg/dL — ABNORMAL LOW (ref 1.7–2.4)

## 2019-06-10 LAB — GLUCOSE, CAPILLARY
Glucose-Capillary: 124 mg/dL — ABNORMAL HIGH (ref 70–99)
Glucose-Capillary: 144 mg/dL — ABNORMAL HIGH (ref 70–99)
Glucose-Capillary: 164 mg/dL — ABNORMAL HIGH (ref 70–99)
Glucose-Capillary: 140 mg/dL — ABNORMAL HIGH (ref 70–99)
Glucose-Capillary: 174 mg/dL — ABNORMAL HIGH (ref 70–99)
Glucose-Capillary: 76 mg/dL (ref 70–99)

## 2019-06-10 MED ORDER — MAGNESIUM SULFATE 4 GM/100ML IV SOLN
4.0000 g | Freq: Once | INTRAVENOUS | Status: AC
  Administered 2019-06-10: 4 g via INTRAVENOUS
  Filled 2019-06-10: qty 100

## 2019-06-10 MED ORDER — MIDODRINE HCL 5 MG PO TABS
2.5000 mg | ORAL_TABLET | Freq: Three times a day (TID) | ORAL | Status: DC
  Administered 2019-06-10 – 2019-06-11 (×5): 2.5 mg via ORAL
  Filled 2019-06-10 (×6): qty 1

## 2019-06-10 NOTE — Progress Notes (Signed)
3 Days Post-Op   Subjective/Chief Complaint: wants to eat  Pain 6/10 Wants solid food    Objective: Vital signs in last 24 hours: Temp:  [97.5 F (36.4 C)-99.3 F (37.4 C)] 98 F (36.7 C) (05/16 0800) Pulse Rate:  [45-81] 74 (05/16 0600) Resp:  [15-25] 15 (05/16 0600) BP: (72-135)/(46-117) 131/83 (05/16 0600) SpO2:  [86 %-100 %] 86 % (05/16 0600) Arterial Line BP: (69-136)/(43-86) 107/85 (05/16 0600)    Intake/Output from previous day: 05/15 0701 - 05/16 0700 In: 3018.9 [P.O.:1440; I.V.:1186.5; IV Piggyback:392.3] Out: 1485 [Urine:1405; Drains:80] Intake/Output this shift: Total I/O In: 9 [I.V.:9] Out: -    Gen: comfortable, no distress Neuro: non-focal exam HEENT: PERRL Neck: supple CV: RRR Pulm: unlabored breathing Abd: soft, appropriatelyTTP, incision c/d/i with staples, JP x2 on left , on-q present GU: clear yellow urine, foley Extr: wwp, trace edema Lab Results:  Recent Labs    06/09/19 1535 06/10/19 0408  WBC 12.7* 11.5*  HGB 8.3* 8.3*  HCT 25.9* 25.6*  PLT PLATELET CLUMPS NOTED ON SMEAR, UNABLE TO ESTIMATE 201   BMET Recent Labs    06/09/19 1849 06/10/19 0408  NA 131* 133*  K 4.1 3.9  CL 99 102  CO2 25 24  GLUCOSE 183* 127*  BUN 6* 5*  CREATININE 0.99 1.04  CALCIUM 8.3* 8.5*   PT/INR Recent Labs    06/07/19 1215 06/08/19 0431  LABPROT 16.4* 13.6  INR 1.4* 1.1   ABG Recent Labs    06/07/19 0945 06/07/19 1035  PHART 7.253* 7.265*  HCO3 20.6 19.7*    Studies/Results: No results found.  Anti-infectives: Anti-infectives (From admission, onward)   Start     Dose/Rate Route Frequency Ordered Stop   06/07/19 1400  ciprofloxacin (CIPRO) IVPB 400 mg     400 mg 200 mL/hr over 60 Minutes Intravenous Every 12 hours 06/07/19 1356 06/07/19 1649   06/07/19 0600  ciprofloxacin (CIPRO) IVPB 400 mg     400 mg 200 mL/hr over 60 Minutes Intravenous On call to O.R. 06/07/19 2025 06/07/19 0806   06/07/19 0558  ciprofloxacin (CIPRO) 400  MG/200ML IVPB    Note to Pharmacy: Marga Melnick   : cabinet override      06/07/19 0558 06/07/19 0834      Assessment/Plan: s/p Procedure(s): OPEN DISTAL PANCREATECTOMY, DEBRIEDMENTOF PSEUDO PANCREATIC CYST (N/A) SPLENECTOMY (N/A) Foley out , excellent UOP ABL anemia - continue to monitor, recheck this PM Thrombocytopenia - continue to monitor, recheck this PM OnQ, PCA for acute pain - leave in until Monday . Has oxy q 4 hours for chronic pain. PRN robaxin pain better today  Hemorrhagic shock - continue weaning off neo FEN -ADVANCE DIET HAVING BOWEL FUNCTION . Recheck lytes this PM PT/OT on board, continue, OOB to chair today DVT - SCDs, no lovenox with hgb drop and thrombocytopenia  LOS: 3 days    Turner Daniels MD  06/10/2019

## 2019-06-11 LAB — GLUCOSE, CAPILLARY
Glucose-Capillary: 123 mg/dL — ABNORMAL HIGH (ref 70–99)
Glucose-Capillary: 134 mg/dL — ABNORMAL HIGH (ref 70–99)
Glucose-Capillary: 148 mg/dL — ABNORMAL HIGH (ref 70–99)
Glucose-Capillary: 114 mg/dL — ABNORMAL HIGH (ref 70–99)
Glucose-Capillary: 144 mg/dL — ABNORMAL HIGH (ref 70–99)
Glucose-Capillary: 126 mg/dL — ABNORMAL HIGH (ref 70–99)

## 2019-06-11 LAB — CBC
HCT: 27.4 % — ABNORMAL LOW (ref 39.0–52.0)
Hemoglobin: 8.7 g/dL — ABNORMAL LOW (ref 13.0–17.0)
MCH: 28 pg (ref 26.0–34.0)
MCHC: 31.8 g/dL (ref 30.0–36.0)
MCV: 88.1 fL (ref 80.0–100.0)
Platelets: 310 10*3/uL (ref 150–400)
RBC: 3.11 MIL/uL — ABNORMAL LOW (ref 4.22–5.81)
RDW: 16.5 % — ABNORMAL HIGH (ref 11.5–15.5)
WBC: 8.6 10*3/uL (ref 4.0–10.5)
nRBC: 0 % (ref 0.0–0.2)

## 2019-06-11 LAB — BASIC METABOLIC PANEL
Anion gap: 11 (ref 5–15)
BUN: 7 mg/dL — ABNORMAL LOW (ref 8–23)
CO2: 24 mmol/L (ref 22–32)
Calcium: 8.8 mg/dL — ABNORMAL LOW (ref 8.9–10.3)
Chloride: 99 mmol/L (ref 98–111)
Creatinine, Ser: 0.98 mg/dL (ref 0.61–1.24)
GFR calc Af Amer: >60 mL/min (ref >60)
GFR calc non Af Amer: >60 mL/min (ref >60)
Glucose, Bld: 131 mg/dL — ABNORMAL HIGH (ref 70–99)
Potassium: 3.9 mmol/L (ref 3.5–5.1)
Sodium: 134 mmol/L — ABNORMAL LOW (ref 135–145)

## 2019-06-11 LAB — PHOSPHORUS: Phosphorus: 3.8 mg/dL (ref 2.5–4.6)

## 2019-06-11 LAB — MAGNESIUM: Magnesium: 1.7 mg/dL (ref 1.7–2.4)

## 2019-06-11 LAB — SURGICAL PATHOLOGY

## 2019-06-11 MED ORDER — SODIUM CHLORIDE 0.9% FLUSH
10.0000 mL | INTRAVENOUS | Status: DC | PRN
  Administered 2019-06-14 – 2019-06-15 (×2): 10 mL

## 2019-06-11 MED ORDER — PANTOPRAZOLE SODIUM 40 MG PO TBEC
40.0000 mg | DELAYED_RELEASE_TABLET | Freq: Every day | ORAL | Status: DC
  Administered 2019-06-11 – 2019-06-14 (×4): 40 mg via ORAL
  Filled 2019-06-11 (×4): qty 1

## 2019-06-11 MED ORDER — SODIUM CHLORIDE 0.9% FLUSH
10.0000 mL | Freq: Two times a day (BID) | INTRAVENOUS | Status: DC
  Administered 2019-06-11: 20 mL
  Administered 2019-06-11 – 2019-06-18 (×6): 10 mL

## 2019-06-11 MED ORDER — LACTATED RINGERS IV BOLUS
500.0000 mL | Freq: Once | INTRAVENOUS | Status: AC
  Administered 2019-06-11: 500 mL via INTRAVENOUS

## 2019-06-11 MED ORDER — ALBUMIN HUMAN 25 % IV SOLN
25.0000 g | Freq: Four times a day (QID) | INTRAVENOUS | Status: AC
  Administered 2019-06-11 – 2019-06-13 (×8): 25 g via INTRAVENOUS
  Filled 2019-06-11 (×8): qty 100

## 2019-06-11 NOTE — Progress Notes (Signed)
Nutrition Follow-up  DOCUMENTATION CODES:   Severe malnutrition in context of acute illness/injury  INTERVENTION:   Magic cup TID with meals, each supplement provides 290 kcal and 9 grams of protein  Snacks TID  Encourage PO intake; food preferences  Discussed importance of adequate nutrition post op  NUTRITION DIAGNOSIS:   Severe Malnutrition related to acute illness(pancreatitis causing N/V) as evidenced by moderate muscle depletion, severe muscle depletion, moderate fat depletion, percent weight loss.  Ongoing.   GOAL:   Patient will meet greater than or equal to 90% of their needs  Progressing.   MONITOR:   PO intake, Supplement acceptance  REASON FOR ASSESSMENT:   Malnutrition Screening Tool    ASSESSMENT:   The patient is a 69 year old male who presents with acute pancreatitis. Pt has a complicated history involving pancreatitis and pancreatic pseudocysts.  He has been admitted for this several times in the last two years.  It appears that the pancreatitis started in August 2019.  Pt discussed during ICU rounds and with RN.   Spoke with pt and wife Per pt he lost from 225 lb down to 178 lb since March 2021 but has been able to regain in the last month to current weight. States he went down 2 pant sizes. He reports that previously he would become nauseous when eating and was not able to eat much but was able to eat more this last month. He is sleepy during interview and not specific. Per chart review pt with 7% weight loss x 2 months. Pt does not like any supplements (boost breeze or ensure) and does not like milk. He is willing to try magic cups and snacks. He has not liked the food despite being on a regular diet. Pt is on room service, we reviewed the menu for alternate options.   Medications reviewed and include: lasix, SSI, MVI  Labs reviewed:  CBG's: [phone]   NUTRITION - FOCUSED PHYSICAL EXAM:    Most Recent Value  Orbital Region  Moderate  depletion  Upper Arm Region  Moderate depletion  Thoracic and Lumbar Region  Moderate depletion  Buccal Region  Moderate depletion  Temple Region  Moderate depletion  Clavicle Bone Region  Moderate depletion  Clavicle and Acromion Bone Region  Moderate depletion  Scapular Bone Region  Unable to assess  Dorsal Hand  Severe depletion  Patellar Region  Severe depletion  Anterior Thigh Region  Severe depletion  Posterior Calf Region  Severe depletion  Edema (RD Assessment)  None  Eyes  Reviewed  Mouth  Reviewed  Skin  Reviewed  Nails  Reviewed       Diet Order:   Diet Order            Diet regular Room service appropriate? Yes with Assist; Fluid consistency: Thin  Diet effective now              EDUCATION NEEDS:   No education needs have been identified at this time  Skin:  Skin Assessment: Skin Integrity Issues: Skin Integrity Issues:: Incisions Incisions: closed abdomen  Last BM:  Unknown  Height:   Ht Readings from Last 1 Encounters:  06/07/19 6' (1.829 m)    Weight:   Wt Readings from Last 1 Encounters:  06/07/19 95 kg    Ideal Body Weight:  80.9 kg  BMI:  Body mass index is 28.4 kg/m.  Estimated Nutritional Needs:   Kcal:  2400-2600  Protein:  125-140 grams  Fluid:  > 2.4 L  Steve Anthony., RD, LDN, CNSC See AMiON for contact information

## 2019-06-11 NOTE — Progress Notes (Signed)
Ambulated pt to chair without complications.  SBP remained unchanged.  Pt sat in chair for over an hour and ambulated back with 1 assist.  SBP still unchanged. (No orthostatic hypotension).

## 2019-06-11 NOTE — Progress Notes (Signed)
4 Days Post-Op   Subjective/Chief Complaint: wants to eat  Pt doesn't like the food.  He is not nauseated.  Some flatus.  Has had multiple BMs.   Continues to have episodes of orthostatic hypotension.     Objective: Vital signs in last 24 hours: Temp:  [98 F (36.7 C)-99.5 F (37.5 C)] 98.3 F (36.8 C) (05/17 0800) Pulse Rate:  [57-152] 71 (05/17 0900) Resp:  [14-40] 17 (05/17 0900) BP: (52-136)/(38-95) 96/67 (05/17 0900) SpO2:  [82 %-100 %] 87 % (05/17 0900) Arterial Line BP: (70-148)/(36-134) 89/52 (05/16 1810)    Intake/Output from previous day: 05/16 0701 - 05/17 0700 In: 216.8 [I.V.:116.8; IV Piggyback:100] Out: 1675 [Urine:1600; Drains:75] Intake/Output this shift: No intake/output data recorded.  Gen: comfortable, no distress Neuro: non-focal exam HEENT: PERRL Neck: supple CV: RRR Pulm: unlabored breathing Abd: soft, appropriatelyTTP, incision c/d/i with staples, JP x2 on left , on-q present GU: clear yellow urine, foley Extr: wwp, trace edema Lab Results:  Recent Labs    06/10/19 0408 06/11/19 0455  WBC 11.5* 8.6  HGB 8.3* 8.7*  HCT 25.6* 27.4*  PLT 201 310   BMET Recent Labs    06/10/19 0408 06/11/19 0455  NA 133* 134*  K 3.9 3.9  CL 102 99  CO2 24 24  GLUCOSE 127* 131*  BUN 5* 7*  CREATININE 1.04 0.98  CALCIUM 8.5* 8.8*   PT/INR No results for input(s): LABPROT, INR in the last 72 hours. ABG No results for input(s): PHART, HCO3 in the last 72 hours.  Invalid input(s): PCO2, PO2  Studies/Results: No results found.  Anti-infectives: Anti-infectives (From admission, onward)   Start     Dose/Rate Route Frequency Ordered Stop   06/07/19 1400  ciprofloxacin (CIPRO) IVPB 400 mg     400 mg 200 mL/hr over 60 Minutes Intravenous Every 12 hours 06/07/19 1356 06/07/19 1649   06/07/19 0600  ciprofloxacin (CIPRO) IVPB 400 mg     400 mg 200 mL/hr over 60 Minutes Intravenous On call to O.R. 06/07/19 0551 06/07/19 0806   06/07/19 0558   ciprofloxacin (CIPRO) 400 MG/200ML IVPB    Note to Pharmacy: Marga Melnick   : cabinet override      06/07/19 0558 06/07/19 0834      Assessment/Plan: s/p Procedure(s): OPEN DISTAL PANCREATECTOMY, DEBRIEDMENTOF PSEUDO PANCREATIC CYST (N/A) SPLENECTOMY (N/A)   Foley out , excellent UOP ABL anemia - stable.   Thrombocytopenia - resolved.  D/c onQ Hemorrhagic shock - continue weaning off neo FEN -ADVANCE DIET HAVING BOWEL FUNCTION .  PT/OT on board, continue, OOB to chair today   LOS: 4 days    Stark Klein MD  06/11/2019

## 2019-06-11 NOTE — Progress Notes (Signed)
Occupational Therapy Treatment Patient Details Name: Steve Anthony MRN: [id number] DOB: [birth year] Today's Date: 06/11/2019    History of present illness 69 year old male who presents with acute pancreatitis. Pt has a complicated history involving pancreatitis and pancreatic pseudocysts, starting in August 2019. Pt with most recent infection in February 2021 with aspiration which grew enterococcus fecalis. Pt with nausea and vomiting with attempts at eating/drinking. Pt underwent Distal pancreatectomy, splenectomy, debridement of pancreatic necrosis on 06/08/19.   OT comments  Patient continues to make progress towards goals in skilled OT session. Patient's session encompassed co-treat with PT in hopes to increase activity tolerance and endurance. Pt remains limited due to BP levels, as sitting EOB (71/42), waited a few minutes (68/47) and pt stated "I feel like I might black out" and returned to supine. Pt able to use bed rails and controls to reposition in bed, and supine BP assessed at 97/60. RN made aware of measures, and energy conservation handout provided to spouse to begin education process for further sessions; will continue to follow acutely.    Follow Up Recommendations  Home health OT    Equipment Recommendations  None recommended by OT    Recommendations for Other Services      Precautions / Restrictions Precautions Precautions: Fall Precaution Comments: 2 drains, watch BP Restrictions Weight Bearing Restrictions: No       Mobility Bed Mobility Overal bed mobility: Needs Assistance Bed Mobility: Supine to Sit     Supine to sit: HOB elevated;Min guard Sit to supine: Min guard   General bed mobility comments: mostly pt performance, assist for lines, technique and safety  Transfers                 General transfer comment: NT, too orthostatic    Balance Overall balance assessment: Needs assistance Sitting-balance support: Feet supported;Feet  unsupported Sitting balance-Leahy Scale: Good Sitting balance - Comments: initially sitting with one foot on bed and other dangling without LOB, some close S due to low BP and "feeling like blacking out"                                   ADL either performed or assessed with clinical judgement   ADL Overall ADL's : Needs assistance/impaired     Grooming: Minimal assistance;Bed level;Wash/dry face;Wash/dry hands Grooming Details (indicate cue type and reason): Sitting EOB                             Functional mobility during ADLs: Minimal assistance;Min guard General ADL Comments: Pt remains limited by BP at EOB (71/42 initally sitting, after a few minutes 68/47 sitting EOB) returned to supine     Vision       Perception     Praxis      Cognition Arousal/Alertness: Awake/alert Behavior During Therapy: WFL for tasks assessed/performed Overall Cognitive Status: Within Functional Limits for tasks assessed                                          Exercises     Shoulder Instructions       General Comments on 4L O2 SpO2 WNL, BP sitting initially 71/42, sitting 2 minutes 68/47 & pt symptomatic, then back in supine 97/60, RN aware  Pertinent Vitals/ Pain       Pain Assessment: Faces Faces Pain Scale: Hurts whole lot Pain Location: abdomen Pain Descriptors / Indicators: Grimacing;Guarding Pain Intervention(s): Limited activity within patient's tolerance;Monitored during session;Repositioned  Home Living                                          Prior Functioning/Environment              Frequency  Min 3X/week        Progress Toward Goals  OT Goals(current goals can now be found in the care plan section)  Progress towards OT goals: Progressing toward goals  Acute Rehab OT Goals Patient Stated Goal: To improve mobility and reduce pain OT Goal Formulation: With patient Time For Goal Achievement:  06/22/19 Potential to Achieve Goals: Good  Plan Discharge plan remains appropriate    Co-evaluation      Reason for Co-Treatment: Complexity of the patient's impairments (multi-system involvement);For patient/therapist safety;To address functional/ADL transfers PT goals addressed during session: Mobility/safety with mobility;Balance OT goals addressed during session: ADL's and self-care      AM-PAC OT "6 Clicks" Daily Activity     Outcome Measure   Help from another person eating meals?: A Little Help from another person taking care of personal grooming?: A Little Help from another person toileting, which includes using toliet, bedpan, or urinal?: A Little Help from another person bathing (including washing, rinsing, drying)?: A Little Help from another person to put on and taking off regular upper body clothing?: A Little   6 Click Score: 15    End of Session Equipment Utilized During Treatment: Oxygen  OT Visit Diagnosis: Unsteadiness on feet (R26.81);Muscle weakness (generalized) (M62.81);Pain   Activity Tolerance Patient tolerated treatment well(Session limited due to BP)   Patient Left in bed;with call bell/phone within reach;with bed alarm set;with family/visitor present   Nurse Communication Mobility status;Precautions        Time: 2924-4628 OT Time Calculation (min): 16 min  Charges: OT General Charges $OT Visit: 1 Visit OT Treatments $Self Care/Home Management : 8-22 mins  Corinne Ports E. Zaharah Amir, COTA/L Acute Rehabilitation Services [place] 06/11/2019, 11:41 AM

## 2019-06-11 NOTE — Progress Notes (Signed)
Physical Therapy Treatment Patient Details Name: Steve Anthony MRN: [id number] DOB: 08/12/1950 Today's Date: 06/11/2019    History of Present Illness 69 year old male who presents with acute pancreatitis. Pt has a complicated history involving pancreatitis and pancreatic pseudocysts, starting in August 2019. Pt with most recent infection in February 2021 with aspiration which grew enterococcus fecalis. Pt with nausea and vomiting with attempts at eating/drinking. Pt underwent Distal pancreatectomy, splenectomy, debridement of pancreatic necrosis on 06/08/19.    PT Comments    Patient mobilizing well to EOB, but unable to tolerate sitting long due to orthostatic BP so returned to supine.  RN made aware.  Will continue attempts to mobilize OOB when stable.    Follow Up Recommendations  Home health PT;Supervision/Assistance - 24 hour     Equipment Recommendations  None recommended by PT    Recommendations for Other Services       Precautions / Restrictions Precautions Precautions: Fall Precaution Comments: 2 drains, watch BP Restrictions Weight Bearing Restrictions: No    Mobility  Bed Mobility Overal bed mobility: Needs Assistance Bed Mobility: Supine to Sit     Supine to sit: HOB elevated;Min guard Sit to supine: Min guard   General bed mobility comments: mostly pt performance, assist for lines, technique and safety  Transfers                 General transfer comment: NT, too orthostatic  Ambulation/Gait                 Stairs             Wheelchair Mobility    Modified Rankin (Stroke Patients Only)       Balance Overall balance assessment: Needs assistance Sitting-balance support: Feet supported;Feet unsupported Sitting balance-Leahy Scale: Good Sitting balance - Comments: initially sitting with one foot on bed and other dangling without LOB, some close S due to low BP and "feeling like blacking out"                                     Cognition Arousal/Alertness: Awake/alert Behavior During Therapy: WFL for tasks assessed/performed Overall Cognitive Status: Within Functional Limits for tasks assessed                                        Exercises      General Comments General comments (skin integrity, edema, etc.): on 4L O2 SpO2 WNL, BP sitting initially 71/42, sitting 2 minutes 68/47 & pt symptomatic, then back in supine 97/60, RN aware      Pertinent Vitals/Pain Pain Assessment: Faces Faces Pain Scale: Hurts whole lot Pain Location: abdomen Pain Descriptors / Indicators: Grimacing;Guarding Pain Intervention(s): Limited activity within patient's tolerance;Monitored during session;Repositioned    Home Living                      Prior Function            PT Goals (current goals can now be found in the care plan section) Acute Rehab PT Goals Patient Stated Goal: To improve mobility and reduce pain Progress towards PT goals: Not progressing toward goals - comment(limited by symptomatic orthostatic hypotension)    Frequency    Min 3X/week      PT Plan Current plan remains appropriate  Co-evaluation PT/OT/SLP Co-Evaluation/Treatment: Yes Reason for Co-Treatment: Complexity of the patient's impairments (multi-system involvement);For patient/therapist safety;To address functional/ADL transfers PT goals addressed during session: Mobility/safety with mobility;Balance OT goals addressed during session: ADL's and self-care      AM-PAC PT "6 Clicks" Mobility   Outcome Measure  Help needed turning from your back to your side while in a flat bed without using bedrails?: None Help needed moving from lying on your back to sitting on the side of a flat bed without using bedrails?: A Little Help needed moving to and from a bed to a chair (including a wheelchair)?: A Little Help needed standing up from a chair using your arms (e.g., wheelchair or bedside chair)?:  Total Help needed to walk in hospital room?: Total Help needed climbing 3-5 steps with a railing? : Total 6 Click Score: 13    End of Session Equipment Utilized During Treatment: Oxygen Activity Tolerance: Treatment limited secondary to medical complications (Comment)(orthostatic BP) Patient left: in bed;with call bell/phone within reach;with family/visitor present Nurse Communication: Mobility status;Other (comment)(Orthostatic) PT Visit Diagnosis: Unsteadiness on feet (R26.81);Other abnormalities of gait and mobility (R26.89);Muscle weakness (generalized) (M62.81);Pain Pain - Right/Left: (abdomen)     Time: 6659-9357 PT Time Calculation (min) (ACUTE ONLY): 16 min  Charges:                        [name], PT [place] [phone] 06/11/2019    [name] 06/11/2019, 12:16 PM

## 2019-06-12 DIAGNOSIS — E43 Unspecified severe protein-calorie malnutrition: Secondary | ICD-10-CM | POA: Diagnosis present

## 2019-06-12 HISTORY — DX: Unspecified severe protein-calorie malnutrition: E43

## 2019-06-12 LAB — GLUCOSE, CAPILLARY
Glucose-Capillary: 120 mg/dL — ABNORMAL HIGH (ref 70–99)
Glucose-Capillary: 131 mg/dL — ABNORMAL HIGH (ref 70–99)
Glucose-Capillary: 152 mg/dL — ABNORMAL HIGH (ref 70–99)
Glucose-Capillary: 101 mg/dL — ABNORMAL HIGH (ref 70–99)
Glucose-Capillary: 143 mg/dL — ABNORMAL HIGH (ref 70–99)

## 2019-06-12 LAB — CBC
HCT: 26.5 % — ABNORMAL LOW (ref 39.0–52.0)
Hemoglobin: 8.3 g/dL — ABNORMAL LOW (ref 13.0–17.0)
MCH: 27.9 pg (ref 26.0–34.0)
MCHC: 31.3 g/dL (ref 30.0–36.0)
MCV: 88.9 fL (ref 80.0–100.0)
Platelets: 316 10*3/uL (ref 150–400)
RBC: 2.98 MIL/uL — ABNORMAL LOW (ref 4.22–5.81)
RDW: 16.1 % — ABNORMAL HIGH (ref 11.5–15.5)
WBC: 7.4 10*3/uL (ref 4.0–10.5)
nRBC: 0 % (ref 0.0–0.2)

## 2019-06-12 LAB — BASIC METABOLIC PANEL
Anion gap: 10 (ref 5–15)
BUN: 6 mg/dL — ABNORMAL LOW (ref 8–23)
CO2: 26 mmol/L (ref 22–32)
Calcium: 9.1 mg/dL (ref 8.9–10.3)
Chloride: 98 mmol/L (ref 98–111)
Creatinine, Ser: 0.95 mg/dL (ref 0.61–1.24)
GFR calc Af Amer: >60 mL/min (ref >60)
GFR calc non Af Amer: >60 mL/min (ref >60)
Glucose, Bld: 148 mg/dL — ABNORMAL HIGH (ref 70–99)
Potassium: 3.4 mmol/L — ABNORMAL LOW (ref 3.5–5.1)
Sodium: 134 mmol/L — ABNORMAL LOW (ref 135–145)

## 2019-06-12 LAB — PHOSPHORUS: Phosphorus: 3.2 mg/dL (ref 2.5–4.6)

## 2019-06-12 LAB — CORTISOL: Cortisol, Plasma: 23.1 ug/dL

## 2019-06-12 LAB — MAGNESIUM: Magnesium: 1.3 mg/dL — ABNORMAL LOW (ref 1.7–2.4)

## 2019-06-12 MED ORDER — POTASSIUM CHLORIDE CRYS ER 20 MEQ PO TBCR
20.0000 meq | EXTENDED_RELEASE_TABLET | Freq: Two times a day (BID) | ORAL | Status: DC
  Administered 2019-06-12 – 2019-06-13 (×3): 20 meq via ORAL
  Filled 2019-06-12 (×3): qty 1

## 2019-06-12 MED ORDER — MIDODRINE HCL 5 MG PO TABS
5.0000 mg | ORAL_TABLET | Freq: Three times a day (TID) | ORAL | Status: DC
  Administered 2019-06-12 – 2019-06-13 (×4): 5 mg via ORAL
  Filled 2019-06-12 (×3): qty 1

## 2019-06-12 MED ORDER — POTASSIUM CHLORIDE 10 MEQ/100ML IV SOLN
10.0000 meq | INTRAVENOUS | Status: AC
  Administered 2019-06-12 (×4): 10 meq via INTRAVENOUS
  Filled 2019-06-12: qty 100

## 2019-06-12 MED ORDER — MAGNESIUM SULFATE 2 GM/50ML IV SOLN
2.0000 g | Freq: Once | INTRAVENOUS | Status: AC
  Administered 2019-06-12: 2 g via INTRAVENOUS
  Filled 2019-06-12: qty 50

## 2019-06-12 NOTE — Addendum Note (Signed)
Addendum  created 06/12/19 0817 by Josephine Igo, CRNA   Order list changed

## 2019-06-12 NOTE — Progress Notes (Signed)
   06/12/19 1121  Assess: MEWS Score  Temp 97.9 F (36.6 C)  BP (!) 79/54  Pulse Rate 63  Resp 18  SpO2 96 %  O2 Device Room Air  Assess: MEWS Score  MEWS Temp 0  MEWS Systolic 2  MEWS Pulse 0  MEWS RR 0  MEWS LOC 0  MEWS Score 2  MEWS Score Color Yellow  Assess: if the MEWS score is Yellow or Red  Were vital signs taken at a resting state? No  Early Detection of Sepsis Score *See Row Information* Low  MEWS guidelines implemented *See Row Information* Yes  Treat  MEWS Interventions Administered prn meds/treatments  Take Vital Signs  Increase Vital Sign Frequency  Yellow: Q 2hr X 2 then Q 4hr X 2, if remains yellow, continue Q 4hrs  Notify: Charge Nurse/RN  Name of Charge Nurse/RN Notified Esperanza Richters  Date Charge Nurse/RN Notified 06/12/19  Time Charge Nurse/RN Notified 48  Dr. [name] was notified and said that patient has been doing that once a day and will slowly go back to normal and that we'll continue to monitor

## 2019-06-12 NOTE — Progress Notes (Signed)
5 Days Post-Op   Subjective/Chief Complaint: wants to eat  Much less orthostatic hypotension.  Objective: Vital signs in last 24 hours: Temp:  [97.9 F (36.6 C)-99.3 F (37.4 C)] 97.9 F (36.6 C) (05/18 0800) Pulse Rate:  [58-71] 63 (05/18 0800) Resp:  [14-19] 18 (05/18 0800) BP: (68-142)/(47-95) 140/88 (05/18 0800) SpO2:  [94 %-99 %] 96 % (05/18 0800)    Intake/Output from previous day: 05/17 0701 - 05/18 0700 In: 810.9 [I.V.:0.5; IV Piggyback:810.4] Out: 2125 [Urine:2025; Drains:100] Intake/Output this shift: Total I/O In: 29.4 [IV Piggyback:29.4] Out: -   Gen: comfortable, no distress Neuro: non-focal exam HEENT: PERRL Neck: supple CV: RRR Pulm: unlabored breathing Abd: soft, appropriatelyTTP, incision c/d/i with staples, JP x2 on left GU: clear yellow urine, foley Extr: wwp, trace edema  Lab Results:  Recent Labs    06/11/19 0455 06/12/19 0457  WBC 8.6 7.4  HGB 8.7* 8.3*  HCT 27.4* 26.5*  PLT 310 316   BMET Recent Labs    06/11/19 0455 06/12/19 0457  NA 134* 134*  K 3.9 3.4*  CL 99 98  CO2 24 26  GLUCOSE 131* 148*  BUN 7* 6*  CREATININE 0.98 0.95  CALCIUM 8.8* 9.1   PT/INR No results for input(s): LABPROT, INR in the last 72 hours. ABG No results for input(s): PHART, HCO3 in the last 72 hours.  Invalid input(s): PCO2, PO2  Studies/Results: No results found.  Anti-infectives: Anti-infectives (From admission, onward)   Start     Dose/Rate Route Frequency Ordered Stop   06/07/19 1400  ciprofloxacin (CIPRO) IVPB 400 mg     400 mg 200 mL/hr over 60 Minutes Intravenous Every 12 hours 06/07/19 1356 06/07/19 1649   06/07/19 0600  ciprofloxacin (CIPRO) IVPB 400 mg     400 mg 200 mL/hr over 60 Minutes Intravenous On call to O.R. 06/07/19 0551 06/07/19 0806   06/07/19 0558  ciprofloxacin (CIPRO) 400 MG/200ML IVPB    Note to Pharmacy: Marga Melnick   : cabinet override      06/07/19 0558 06/07/19 0834      Assessment/Plan: s/p  Procedure(s): OPEN DISTAL PANCREATECTOMY, DEBRIEDMENTOF PSEUDO PANCREATIC CYST (N/A) SPLENECTOMY (N/A)   Foley out , excellent UOP ABL anemia - stable.   Thrombocytopenia - resolved.  D/c onQ Hemorrhagic shock - resolved.   FEN -diet as tolerated.   PT/OT on board, continue   LOS: 5 days    Stark Klein MD  06/12/2019

## 2019-06-12 NOTE — Progress Notes (Signed)
Calorie Count Note  48 hour calorie count ordered.  Diet: regular Supplements: Magic cup TID with meals, each supplement provides 290 kcal and 9 grams of protein; snacks TID  Nutrition Dx: Severe Malnutrition related to acute illness(pancreatitis causing N/V) as evidenced by moderate muscle depletion, severe muscle depletion, moderate fat depletion, percent weight loss; ongoing  Goal: Patient will meet greater than or equal to 90% of their needs; progressing   Intervention:   -Initiate 48 hour calorie count per MD -Continue Magic cup TID with meals, each supplement provides 290 kcal and 9 grams of protein -Continue snacks TID  Loistine Chance, RD, LDN, Braylynn Lewing Registered Dietitian II Certified Diabetes Care and Education Specialist Please refer to [facility] for RD and/or RD on-call/weekend/after hours pager

## 2019-06-12 NOTE — Progress Notes (Signed)
Drain 1 removed.

## 2019-06-12 NOTE — TOC Initial Note (Signed)
Transition of Care [facility]) - Initial/Assessment Note    Patient Details  Name: Steve Anthony MRN: [id number] Date of Birth: 1951-01-18  [facility] CM/SW Contact:    Marilu Favre, RN Phone Number: 06/12/2019, 2:21 PM  Clinical Narrative:                  Discussed PT recommendations for HHPT. Discussed with patient and wife at bedside.   At this time patient does not feel that he needs home health PT.   He has had "leg and hip surgery" and already has wheel chair cane, walker shower chair at home.   Provided Medicare.gov home health list just in case patient changes his mind. Will continue to follow. Expected Discharge Plan: Home/Self Care Barriers to Discharge: Continued Medical Work up   Patient Goals and CMS Choice Patient states their goals for this hospitalization and ongoing recovery are:: to return to home CMS Medicare.gov Compare Post Acute Care list provided to:: Patient Choice offered to / list presented to : Patient  Expected Discharge Plan and Services Expected Discharge Plan: Home/Self Care   Discharge Planning Services: CM Consult Post Acute Care Choice: [place] arrangements for the past 2 months: Single Family Home                 DME Arranged: N/A DME Agency: NA       HH Arranged: Patient Refused HH          Prior Living Arrangements/Services Living arrangements for the past 2 months: Single Family Home Lives with:: Spouse Patient language and need for interpreter reviewed:: Yes Do you feel safe going back to the place where you live?: Yes      Need for Family Participation in Patient Care: Yes (Comment) Care giver support system in place?: Yes (comment) Current home services: DME Criminal Activity/Legal Involvement Pertinent to Current Situation/Hospitalization: No - Comment as needed  Activities of Daily Living Home Assistive Devices/Equipment: None ADL Screening (condition at time of admission) Patient's  cognitive ability adequate to safely complete daily activities?: Yes Is the patient deaf or have difficulty hearing?: No Does the patient have difficulty seeing, even when wearing glasses/contacts?: No Does the patient have difficulty concentrating, remembering, or making decisions?: No Patient able to express need for assistance with ADLs?: Yes Does the patient have difficulty dressing or bathing?: No Independently performs ADLs?: Yes (appropriate for developmental age) Does the patient have difficulty walking or climbing stairs?: No Weakness of Legs: None Weakness of Arms/Hands: None  Permission Sought/Granted   Permission granted to share information with : Yes, Verbal Permission Granted  Share Information with NAME: spouse Vaughan Basta           Emotional Assessment Appearance:: Appears stated age Attitude/Demeanor/Rapport: Engaged Affect (typically observed): Accepting Orientation: : Oriented to Self, Oriented to Place, Oriented to  Time, Oriented to Situation Alcohol / Substance Use: Not Applicable    Admission diagnosis:  Pancreatic pseudocyst [K86.3] Infected pancreatic pseudocyst [K86.3] Patient Active Problem List   Diagnosis Date Noted  . Protein-calorie malnutrition, severe 06/12/2019  . Pancreatic pseudocyst 06/07/2019  . Infected pancreatic pseudocyst 06/07/2019  . Pseudocyst of pancreas 03/06/2019  . Acute pancreatitis 02/20/2019  . CKD (chronic kidney disease), stage III 02/20/2019  . History of COVID-19 01/24/19 02/20/2019  . Abnormal CT of the abdomen 05/27/2018  . Recurrent pancreatitis 05/27/2018  . Idiopathic acute pancreatitis 04/14/2018  . Abnormal MRI of abdomen 04/14/2018  . Pancreatic cyst 04/14/2018  . Cirrhosis  of liver without ascites ([place]) 04/14/2018  . Chest pain 12/16/2016  . Pure hypercholesterolemia   . S/P coronary artery stent placement   . Long term (current) use of anticoagulants [Z79.01] 06/10/2015  . OA (osteoarthritis) of hip  12/18/2014  . Coronary atherosclerosis 12/31/2009  . Sleep apnea 05/17/2009  . COPD without exacerbation ([place]) 05/15/2009  . SHORTNESS OF BREATH 03/19/2009  . Atrial fibrillation ([place]) 11/21/2008  . CHEST PAIN UNSPECIFIED 11/21/2008  . HYPERTRIGLYCERIDEMIA 10/15/2008  . DYSLIPIDEMIA 10/15/2008  . Essential hypertension 10/15/2008  . Unstable angina ([place]) 10/15/2008  . GERD 10/15/2008  . NEPHROLITHIASIS 10/15/2008  . PALPITATIONS 10/15/2008   PCP:  [name], MD Pharmacy:   [id number] [place] [id number]: [phone]: [phone]    Social Determinants of Health (SDOH) Interventions    Readmission Risk Interventions Readmission Risk Prevention Plan 06/12/2019  Transportation Screening Complete  PCP or Specialist Appt within 5-7 Days Complete  Home Care Screening Complete  Medication Review (RN CM) Referral to Pharmacy  Some recent data might be hidden

## 2019-06-13 DIAGNOSIS — I959 Hypotension, unspecified: Secondary | ICD-10-CM

## 2019-06-13 HISTORY — DX: Hypotension, unspecified: I95.9

## 2019-06-13 LAB — TSH: TSH: 1.776 u[IU]/mL (ref 0.350–4.500)

## 2019-06-13 LAB — CBC
HCT: 28.3 % — ABNORMAL LOW (ref 39.0–52.0)
Hemoglobin: 9.1 g/dL — ABNORMAL LOW (ref 13.0–17.0)
MCH: 28 pg (ref 26.0–34.0)
MCHC: 32.2 g/dL (ref 30.0–36.0)
MCV: 87.1 fL (ref 80.0–100.0)
Platelets: 396 10*3/uL (ref 150–400)
RBC: 3.25 MIL/uL — ABNORMAL LOW (ref 4.22–5.81)
RDW: 16 % — ABNORMAL HIGH (ref 11.5–15.5)
WBC: 13.7 10*3/uL — ABNORMAL HIGH (ref 4.0–10.5)
nRBC: 0 % (ref 0.0–0.2)

## 2019-06-13 LAB — PREPARE RBC (CROSSMATCH)

## 2019-06-13 LAB — GLUCOSE, CAPILLARY
Glucose-Capillary: 108 mg/dL — ABNORMAL HIGH (ref 70–99)
Glucose-Capillary: 125 mg/dL — ABNORMAL HIGH (ref 70–99)
Glucose-Capillary: 134 mg/dL — ABNORMAL HIGH (ref 70–99)
Glucose-Capillary: 138 mg/dL — ABNORMAL HIGH (ref 70–99)
Glucose-Capillary: 146 mg/dL — ABNORMAL HIGH (ref 70–99)
Glucose-Capillary: 169 mg/dL — ABNORMAL HIGH (ref 70–99)

## 2019-06-13 LAB — BASIC METABOLIC PANEL
Anion gap: 11 (ref 5–15)
BUN: 8 mg/dL (ref 8–23)
CO2: 25 mmol/L (ref 22–32)
Calcium: 9.4 mg/dL (ref 8.9–10.3)
Chloride: 96 mmol/L — ABNORMAL LOW (ref 98–111)
Creatinine, Ser: 1.1 mg/dL (ref 0.61–1.24)
GFR calc Af Amer: >60 mL/min (ref >60)
GFR calc non Af Amer: >60 mL/min (ref >60)
Glucose, Bld: 147 mg/dL — ABNORMAL HIGH (ref 70–99)
Potassium: 4.3 mmol/L (ref 3.5–5.1)
Sodium: 132 mmol/L — ABNORMAL LOW (ref 135–145)

## 2019-06-13 LAB — MAGNESIUM: Magnesium: 1.5 mg/dL — ABNORMAL LOW (ref 1.7–2.4)

## 2019-06-13 MED ORDER — POTASSIUM CHLORIDE 20 MEQ/15ML (10%) PO SOLN
20.0000 meq | Freq: Two times a day (BID) | ORAL | Status: DC
  Administered 2019-06-13 – 2019-06-18 (×10): 20 meq via ORAL
  Filled 2019-06-13 (×11): qty 15

## 2019-06-13 MED ORDER — PRO-STAT SUGAR FREE PO LIQD
30.0000 mL | Freq: Three times a day (TID) | ORAL | Status: DC
  Administered 2019-06-13 – 2019-06-17 (×2): 30 mL via ORAL
  Filled 2019-06-13 (×10): qty 30

## 2019-06-13 MED ORDER — SODIUM CHLORIDE 0.9% IV SOLUTION: Freq: Once | INTRAVENOUS | Status: AC

## 2019-06-13 MED ORDER — GABAPENTIN 250 MG/5ML PO SOLN
300.0000 mg | Freq: Two times a day (BID) | ORAL | Status: DC
  Administered 2019-06-13 – 2019-06-16 (×6): 300 mg via ORAL
  Filled 2019-06-13 (×6): qty 6

## 2019-06-13 MED ORDER — INSULIN ASPART 100 UNIT/ML ~~LOC~~ SOLN
0.0000 [IU] | Freq: Three times a day (TID) | SUBCUTANEOUS | Status: DC
  Administered 2019-06-13 (×2): 1 [IU] via SUBCUTANEOUS
  Administered 2019-06-13: 2 [IU] via SUBCUTANEOUS
  Administered 2019-06-14 – 2019-06-15 (×5): 1 [IU] via SUBCUTANEOUS
  Administered 2019-06-15 (×2): 2 [IU] via SUBCUTANEOUS
  Administered 2019-06-16 (×3): 1 [IU] via SUBCUTANEOUS
  Administered 2019-06-17 (×2): 2 [IU] via SUBCUTANEOUS
  Administered 2019-06-17: 3 [IU] via SUBCUTANEOUS
  Administered 2019-06-17: 2 [IU] via SUBCUTANEOUS
  Administered 2019-06-18: 1 [IU] via SUBCUTANEOUS

## 2019-06-13 MED ORDER — MIDODRINE HCL 5 MG PO TABS
10.0000 mg | ORAL_TABLET | Freq: Three times a day (TID) | ORAL | Status: DC
  Administered 2019-06-13 – 2019-06-18 (×15): 10 mg via ORAL
  Filled 2019-06-13 (×15): qty 2

## 2019-06-13 NOTE — Progress Notes (Addendum)
6 Days Post-Op   Subjective/Chief Complaint: wants to eat  Still having episodes of hypotension.    Objective: Vital signs in last 24 hours: Temp:  [97.8 F (36.6 C)-100.1 F (37.8 C)] 97.8 F (36.6 C) (05/19 1015) Pulse Rate:  [58-71] 69 (05/19 1015) Resp:  [16-19] 18 (05/19 1015) BP: (79-128)/(54-77) 106/69 (05/19 1015) SpO2:  [92 %-96 %] 93 % (05/19 1015)    Intake/Output from previous day: 05/18 0701 - 05/19 0700 In: 1348.7 [P.O.:720; IV Piggyback:628.7] Out: 2265 [Urine:2245; Drains:20] Intake/Output this shift: Total I/O In: 240 [P.O.:240] Out: 400 [Urine:400]  Gen: comfortable, no distress Neuro: non-focal exam HEENT: PERRL Neck: supple CV: RRR Pulm: unlabored breathing Abd: soft, appropriatelyTTP, incision c/d/i with staples, JP serosang.  No drainage or evidence of wound infection.  GU: clear yellow urine, foley Extr: wwp, trace edema  Lab Results:  Recent Labs    06/11/19 0455 06/12/19 0457  WBC 8.6 7.4  HGB 8.7* 8.3*  HCT 27.4* 26.5*  PLT 310 316   BMET Recent Labs    06/11/19 0455 06/12/19 0457  NA 134* 134*  K 3.9 3.4*  CL 99 98  CO2 24 26  GLUCOSE 131* 148*  BUN 7* 6*  CREATININE 0.98 0.95  CALCIUM 8.8* 9.1   PT/INR No results for input(s): LABPROT, INR in the last 72 hours. ABG No results for input(s): PHART, HCO3 in the last 72 hours.  Invalid input(s): PCO2, PO2  Studies/Results: No results found.  Anti-infectives: Anti-infectives (From admission, onward)   Start     Dose/Rate Route Frequency Ordered Stop   06/07/19 1400  ciprofloxacin (CIPRO) IVPB 400 mg     400 mg 200 mL/hr over 60 Minutes Intravenous Every 12 hours 06/07/19 1356 06/07/19 1649   06/07/19 0600  ciprofloxacin (CIPRO) IVPB 400 mg     400 mg 200 mL/hr over 60 Minutes Intravenous On call to O.R. 06/07/19 0551 06/07/19 0806   06/07/19 0558  ciprofloxacin (CIPRO) 400 MG/200ML IVPB    Note to Pharmacy: Marga Melnick   : cabinet override      06/07/19 0558  06/07/19 0834      Assessment/Plan: s/p Procedure(s): OPEN DISTAL PANCREATECTOMY, DEBRIEDMENTOF PSEUDO PANCREATIC CYST (N/A) SPLENECTOMY (N/A)    ABL anemia - stable.   Thrombocytopenia - resolved.   Continues to have episodic hypotension despite midodrine, multiple fluid boluses and albumin boluses over the last 4-5 days.  Good UOP.  Giving one more unit of pRBCs today to see if that helps. Random cortisol normal  Hemorrhagic shock - resolved.   FEN -diet as tolerated.  Calorie count in progress. DM - SSI.  PT/OT on board, continue  ** remaining post splenectomy vaccines prior to d/c.**   LOS: 6 days    Stark Klein MD  06/13/2019

## 2019-06-13 NOTE — Consult Note (Signed)
[facility] Medical Consultation  [name] RDE:[id number] DOB: [birth year] DOA: 06/07/2019 PCP: [name], MD   Requesting physician: Stark Klein, MD Date of consultation: 06/13/2019 Reason for consultation: Hypotension  Impression/Recommendations Active Problems:   Pancreatic pseudocyst   Infected pancreatic pseudocyst   Protein-calorie malnutrition, severe    1. Recurrent pancreatitis with pancreatic pseudocyst: s/p open distal pancreatectomy, debridement of pseudopancreatic cyst, and splenectomy on 5/13 by Dr. [name].  - Per surgery  2. Recurrent hypotension: Acute. Found to have intermittent episodes of hypotension despite receiving blood, albumin, IV fluids, and midodrine. Patient does not appear to have adrenal insufficiency based off random cortisol level.  He does not want to be placed on any additional medications. -Continue midodrine 10 mg 3 times daily -Trial of TED hose and abdominal binder   3. Acute blood loss anemia: Hemoglobin prior to procedure was around 10, but had dropped as low as 7.6.  Patient transfused 1 unit of packed red blood cells. -Follow-up repeat H&H    I will followup again tomorrow. Please contact me if I can be of assistance in the meanwhile. Thank you for this consultation.  Chief Complaint: Abdominal pain  HPI:  Steve Anthony is a 69 y.o. male with medical history significant of hypertension, dyslipidemia, CAD s/p stent, paroxysmal atrial fibrillation on Eliquis, COPD, cryptogenic cirrhosis([place]), and recurrent pancreatitis with pseudocyst who presented for treatment of a recurrent infected pancreatic pseudocyst.  Patient underwent open distal pancreatectomy, splenectomy, and debridement of pancreatic necrosis on 5/13 by Dr. [name].  Following the procedure patient was noted to be in hemorrhagic shock which he was able to be weaned off neo.  Patient has continued to have intermittent low blood pressures despite receiving blood  transfusions, IV fluids, albumin, and being started on midodrine.  Denies having any chest pain, palpitations, vomiting, or diarrhea.  Patient had actually been constipated up until this morning when he had a bowel movement.  After leaving the bathroom patient felt lightheaded as though he could pass out but never lost consciousness.  At baseline he reports having issues with his blood pressures being up and down, but never had to be on medication to keep his blood pressures up.   Review of Systems  Cardiovascular: Negative for chest pain, palpitations and leg swelling.  Gastrointestinal: Positive for abdominal pain and constipation. Negative for diarrhea, nausea and vomiting.  Genitourinary: Negative for dysuria.  Neurological: Positive for dizziness. Negative for loss of consciousness.     Past Medical History:  Diagnosis Date  . Anemia   . Arthritis   . Asthma   . CAD (coronary artery disease)    a. S/P PCI mid LAD (vision stent) 08/31/2004;  b. repeat cath 6/11 - no progression of CAD. c. Cath 10/2012 given moderate CAD on cardiac CT - no change from prior cath.  . Chest pain 12/16/2016  . COPD (chronic obstructive pulmonary disease) ([place])   . COVID-19 virus infection   . Diverticulitis   . DJD (degenerative joint disease) of lumbar spine   . Dyslipidemia   . Gallstones   . GERD (gastroesophageal reflux disease)   . History of kidney stones   . Hypertension   . Hyperthyroidism   . Melanoma ([place])    melanoma removed from neck   . On home oxygen therapy    "2L q hs" (11/08/2012),  06/04/19 pt. not on oxygen  . OSA (obstructive sleep apnea)    a. cpap noncompliance- patient reports on 12/10/14- does not  use CPAP  . Palpitations    a. 10/2012 - s/p LINQ loop recorder to assess for arrhythmia.   . Pancreatitis   . Paroxysmal A-fib ([place])    a. S/P PVI 2/11 and 9/11  . PICC (peripherally inserted central catheter) in place   . Pneumonia   . SVT (supraventricular tachycardia) ([place])     a. Atypical AVNRT of the slow pathway  . Type II diabetes mellitus ([place])    Past Surgical History:  Procedure Laterality Date  . ATRIAL ABLATION SURGERY  2007   S/P slow pathway ablation for atypical AVNRT   . BIOPSY  07/17/2018   Procedure: BIOPSY;  Surgeon: Rush Landmark Telford Nab., MD;  Location: [facility];  Service: Gastroenterology;;  . [facility]  ~ 2008  . COLON RESECTION  02/2013   . COLONOSCOPY  12/25/2007   Colonic polyp status post polypectomy. Internal hemorrhoids.   . CORONARY ANGIOPLASTY WITH STENT PLACEMENT  08/30/2004   Vision; to LAD   . CYSTOSCOPY W/ STONE MANIPULATION  ~ 1998   "once" (11/08/2012)  . EP Study  2008   Negative EP study in [place]  . ESOPHAGOGASTRODUODENOSCOPY (EGD) WITH PROPOFOL N/A 07/17/2018   Procedure: ESOPHAGOGASTRODUODENOSCOPY (EGD) WITH PROPOFOL;  Surgeon: Rush Landmark Telford Nab., MD;  Location: [facility];  Service: Gastroenterology;  Laterality: N/A;  . FRACTURE SURGERY    . LEFT HEART CATHETERIZATION WITH CORONARY ANGIOGRAM N/A 11/10/2012   Procedure: LEFT HEART CATHETERIZATION WITH CORONARY ANGIOGRAM;  Surgeon: Burnell Blanks, MD;  Location: [facility] C[facility];  Service: Cardiovascular;  Laterality: N/A;  . LOOP RECORDER IMPLANT N/A 11/10/2012   Medtronic LinQ implanted by Dr Rayann Heman for afib management  . [place] SURGERY  2000; 2002  . POSTERIOR LUMBAR FUSION  2004  . PROSTATE SURGERY  ~ 2009  . PVI/ CTI ablation  02/25/2009 and 09/2009   S/P afib/ CTI ablation  . SPLENECTOMY, TOTAL N/A 06/07/2019   Procedure: SPLENECTOMY;  Surgeon: Stark Klein, MD;  Location: [facility];  Service: General;  Laterality: N/A;  . [place]   "broke in 3 places; had rod put in" (11/08/2012)  . TIBIA HARDWARE REMOVAL Right ~ 1991  . TONSILLECTOMY  1965  . TOTAL HIP ARTHROPLASTY Right 12/18/2014   Procedure: RIGHT TOTAL HIP ARTHROPLASTY ANTERIOR APPROACH;  Surgeon: Gaynelle Arabian, MD;  Location: [facility];  Service:  Orthopedics;  Laterality: Right;  . UPPER ESOPHAGEAL ENDOSCOPIC ULTRASOUND (EUS) N/A 07/17/2018   Procedure: UPPER ESOPHAGEAL ENDOSCOPIC ULTRASOUND (EUS);  Surgeon: Irving Copas., MD;  Location: [place] [facility];  Service: Gastroenterology;  Laterality: N/A;   Social History:  reports that he quit smoking about 15 years ago. His smoking use included cigarettes. He has a 114.00 pack-year smoking history. He quit smokeless tobacco use about 15 years ago.  His smokeless tobacco use included chew. He reports previous alcohol use. He reports that he does not use drugs.  Allergies  Allergen Reactions  . Penicillins Hives and Shortness Of Breath    Did it involve swelling of the face/tongue/throat, SOB, or low BP? Yes Did it involve sudden or severe rash/hives, skin peeling, or any reaction on the inside of your mouth or nose? No Did you need to seek medical attention at a hospital or doctor's office? Yes (emergency room) When did it last happen?1974 If all above answers are "NO", may proceed with cephalosporin use.     Family History  Problem Relation Age of Onset  . Emphysema Mother   . Heart attack  Father        CVA  . Hypertension Father   . Diabetes Father   . Heart disease Father        MI  . Emphysema Maternal Grandfather        Smoker  . Heart disease Paternal Grandfather   . Colon cancer Neg Hx   . Esophageal cancer Neg Hx   . Inflammatory bowel disease Neg Hx   . Liver disease Neg Hx   . Pancreatic cancer Neg Hx   . Rectal cancer Neg Hx   . Stomach cancer Neg Hx     Prior to Admission medications   Medication Sig Start Date End Date Taking? Authorizing Provider  acetaminophen (TYLENOL) 325 MG tablet Take 650 mg by mouth every 6 (six) hours as needed for headache.    Yes [provider]  cholecalciferol (VITAMIN D3) 25 MCG (1000 UNIT) tablet Take 1,000 Units by mouth daily.   Yes [provider]  Cyanocobalamin (VITAMIN B-12 IJ) Inject 1,000 mcg as  directed every 30 (thirty) days.   Yes [provider]  docusate sodium (COLACE) 100 MG capsule Take 100 mg by mouth See admin instructions. Three times a week   Yes [provider]  ELIQUIS 5 MG TABS tablet TAKE 1 TABLET BY MOUTH TWICE A DAY Patient taking differently: Take 5 mg by mouth 2 (two) times daily.  12/25/18  Yes Allred, Jeneen Rinks, MD  escitalopram (LEXAPRO) 10 MG tablet Take 10 mg by mouth daily. 05/30/17  Yes [provider]  fenofibrate 160 MG tablet Take 160 mg by mouth daily.     Yes [provider]  ferrous sulfate 325 (65 FE) MG tablet Take 325 mg by mouth See admin instructions. Take 1 tablet 2 times a week   Yes [provider]  furosemide (LASIX) 20 MG tablet Take 40 mg by mouth daily.   Yes [provider]  gabapentin (NEURONTIN) 300 MG capsule Take 300 mg by mouth 2 (two) times daily.    Yes [provider]  ibuprofen (ADVIL) 200 MG tablet Take 400 mg by mouth every 6 (six) hours as needed.   Yes [provider]  Lactobacillus (FLORAJEN ACIDOPHILUS PO) Take 1 capsule by mouth daily.   Yes [provider]  Melatonin 10 MG TABS Take 10 mg by mouth at bedtime.    Yes [provider]  metFORMIN (GLUCOPHAGE) 500 MG tablet Take 500 mg by mouth 2 (two) times daily.   Yes [provider]  metoprolol tartrate (LOPRESSOR) 25 MG tablet Take 0.5 tablets (12.5 mg total) by mouth 2 (two) times daily. 06/05/19 09/03/19 Yes Baldwin Jamaica, PA-C  naloxone [facility]) 4 MG/0.1ML LIQD nasal spray kit Place 1 spray into the nose daily as needed (opioid overdose).   Yes [provider]  OVER THE COUNTER MEDICATION Apply 1 application topically daily as needed (rash). OTC fanny cream   Yes [provider]  oxyCODONE-acetaminophen (PERCOCET) 10-325 MG tablet Take 1 tablet by mouth every 4 (four) hours.    Yes [provider]  pantoprazole (PROTONIX) 40 MG tablet TAKE 1 TABLET BY MOUTH  TWICE A DAY 04/20/19  Yes Mansouraty, Telford Nab., MD  Tamsulosin HCl (FLOMAX) 0.4 MG CAPS Take 0.4 mg by mouth daily.     Yes [provider]  traZODone (DESYREL) 50 MG tablet Take 100 mg by mouth at bedtime.  08/17/17  Yes [provider]  albuterol (VENTOLIN HFA) 108 (90 Base) MCG/ACT inhaler  Inhale 2 puffs into the lungs every 4 (four) hours as needed for wheezing or shortness of breath.  01/28/19   [provider]  KLOR-CON M10 10 MEQ tablet Take 10 mEq by mouth daily. 04/23/19   [provider]  nitroGLYCERIN (NITROSTAT) 0.4 MG SL tablet Place 1 tablet (0.4 mg total) under the tongue every 5 (five) minutes x 3 doses as needed for chest pain. 12/17/16   Barrett, Evelene Croon, PA-C  ondansetron (ZOFRAN ODT) 4 MG disintegrating tablet Take 1 tablet (4 mg total) by mouth every 6 (six) hours as needed for nausea or vomiting. Place under tongue 03/20/19   Noralyn Pick, NP  promethazine (PHENERGAN) 25 MG tablet Take 1 tablet (25 mg total) by mouth every 8 (eight) hours as needed for nausea or vomiting. 09/25/18   Jackquline Denmark, MD  tiZANidine (ZANAFLEX) 4 MG tablet Take 4 mg by mouth 2 (two) times daily.  03/01/18   [provider]   Physical Exam:  Constitutional: Elderly male currently in no acute distress Vitals:   06/12/19 1920 06/12/19 2345 06/13/19 0422 06/13/19 1015  BP: 120/71 100/73 128/77 106/69  Pulse: 70 66 71 69  Resp: _0 Temp: 100.1 F (37.8 C) 99.1 F (37.3 C) 98.8 F (37.1 C) 97.8 F (36.6 C)  TempSrc: Oral Oral Oral Oral  SpO2: 94% 93% 92% 93%  Weight:      Height:       Eyes: PERRL, lids and conjunctivae normal ENMT: Mucous membranes are moist. Posterior pharynx clear of any exudate or lesions.  Neck: normal, supple, no masses, no thyromegaly Respiratory: clear to auscultation bilaterally, no wheezing, no crackles. Normal respiratory effort. No accessory muscle use.  Cardiovascular: Regular rate and rhythm, no  murmurs / rubs / gallops. No extremity edema. 2+ pedal pulses. No carotid bruits.  Abdomen: midline surgical scar with staples present and no signs of erythema.   Drain present on the left lower abdomen.  Bowel sounds present. Musculoskeletal: no clubbing / cyanosis. No joint deformity upper and lower extremities. Good ROM, no contractures. Normal muscle tone.  Skin: no rashes, lesions, ulcers. No induration Neurologic: CN 2-12 grossly intact. Sensation intact, DTR normal. Strength 5/5 in all 4.  Psychiatric: Normal judgment and insight. Alert and oriented x 3. Normal mood.   Labs on Admission:  Basic Metabolic Panel: Recent Labs  Lab 06/09/19 0423 06/09/19 1849 06/10/19 0408 06/11/19 0455 06/12/19 0457  NA 131* 131* 133* 134* 134*  K 4.5 4.1 3.9 3.9 3.4*  CL 103 99 102 99 98  CO2 _1 GLUCOSE 137* 183* 127* 131* 148*  BUN 7* 6* 5* 7* 6*  CREATININE 1.00 0.99 1.04 0.98 0.95  CALCIUM 8.1* 8.3* 8.5* 8.8* 9.1  MG 1.4* 1.8 1.6* 1.7 1.3*  PHOS 2.2* 4.0 3.4 3.8 3.2   Liver Function Tests: No results for input(s): AST, ALT, ALKPHOS, BILITOT, PROT, ALBUMIN in the last 168 hours. No results for input(s): LIPASE, AMYLASE in the last 168 hours. No results for input(s): AMMONIA in the last 168 hours. CBC: Recent Labs  Lab 06/09/19 0423 06/09/19 1535 06/10/19 0408 06/11/19 0455 06/12/19 0457  WBC 13.1* 12.7* 11.5* 8.6 7.4  HGB 7.6* 8.3* 8.3* 8.7* 8.3*  HCT 24.1* 25.9* 25.6* 27.4* 26.5*  MCV 88.0 88.1 87.4 88.1 88.9  PLT 93* PLATELET CLUMPS NOTED ON SMEAR, UNABLE TO ESTIMATE 201 310 316   Cardiac Enzymes: No results for input(s): CKTOTAL, CKMB, CKMBINDEX, TROPONINI  in the last 168 hours. BNP: Invalid input(s): POCBNP CBG: Recent Labs  Lab 06/12/19 1640 06/12/19 2015 06/13/19 0017 06/13/19 0419 06/13/19 0754  GLUCAP 101* 143* 108* 134* 125*    Radiological Exams on Admission: No results found.  EKG: Independently reviewed. Sinus rhythm with PACs low voltage  at 68 bpm  Time spent: >45 minutes  Othman Masur A Tamala Julian [facility] Pager [phone]  If 7PM-7AM, please contact night-coverage [email or web address] Password [facility] 06/13/2019, 10:58 AM

## 2019-06-13 NOTE — Progress Notes (Signed)
PT Cancellation Note  Patient Details Name: Steve Anthony MRN: [id number] DOB: [birth year]   Cancelled Treatment:    Reason Eval/Treat Not Completed: Patient declined, no reason specified. Pt sleeping and family member in room requesting therapist to not wake him up. PT will continue to f/u with pt acutely as available.    [place] 06/13/2019, 4:23 PM

## 2019-06-13 NOTE — Progress Notes (Signed)
Occupational Therapy Treatment Patient Details Name: Steve Anthony MRN: [id number] DOB: 1950-07-11 Today's Date: 06/13/2019    History of present illness 69 year old male who presents with acute pancreatitis. Pt has a complicated history involving pancreatitis and pancreatic pseudocysts, starting in August 2019. Pt with most recent infection in February 2021 with aspiration which grew enterococcus fecalis. Pt with nausea and vomiting with attempts at eating/drinking. Pt underwent Distal pancreatectomy, splenectomy, debridement of pancreatic necrosis on 06/08/19.   OT comments  Patient reclined in chair upon arrival, pt reports has been up in chair since this morning, ambulated to the bathroom but by the time he got to the recliner "I felt like I was going to pass out." Patient denies dizziness seated upright in chair, however with low BP ambulate to get back to bed only. Patient does report feeling dizzy once returned to bed. Pt/spouse state patient supposed to receive blood transfusion today, with patient request to be in bed when this occurs. Will continue to follow.  BP reclined 99/70 BP upright in chair 76/49, after ~2 additional min 73/50 BP semi-supine in bed 94/63   Follow Up Recommendations  Home health OT    Equipment Recommendations  None recommended by OT       Precautions / Restrictions Precautions Precautions: Fall Precaution Comments: 2 drains, watch BP Restrictions Weight Bearing Restrictions: No       Mobility Bed Mobility Overal bed mobility: Needs Assistance Bed Mobility: Sit to Supine       Sit to supine: Supervision      Transfers Overall transfer level: Needs assistance Equipment used: None Transfers: Sit to/from Stand Sit to Stand: Min guard              Balance Overall balance assessment: Needs assistance Sitting-balance support: Feet supported Sitting balance-Leahy Scale: Good     Standing balance support: No upper extremity  supported Standing balance-Leahy Scale: Fair Standing balance comment: min guard for safety                           ADL either performed or assessed with clinical judgement   ADL Overall ADL's : Needs assistance/impaired Eating/Feeding: Independent;Sitting                       Toilet Transfer: Min Psychiatric nurse Details (indicate cue type and reason): simulated with functional ambulation from recliner to bed approx 77f, min guard for safety due to low BP         Functional mobility during ADLs: Min guard General ADL Comments: patient remains limited by low blood pressure, reports he did walk to bathroom this morning but once he got to the recliner he felt like he was going to pass out.                Cognition Arousal/Alertness: Awake/alert Behavior During Therapy: WFL for tasks assessed/performed Overall Cognitive Status: Within Functional Limits for tasks assessed                                                     Pertinent Vitals/ Pain       Pain Assessment: Faces Faces Pain Scale: Hurts even more Pain Location: abdomen Pain Descriptors / Indicators: Grimacing;Guarding Pain Intervention(s): Limited activity within patient's tolerance  Frequency  Min 3X/week        Progress Toward Goals  OT Goals(current goals can now be found in the care plan section)  Progress towards OT goals: Progressing toward goals  Acute Rehab OT Goals Patient Stated Goal: To improve mobility and reduce pain OT Goal Formulation: With patient Time For Goal Achievement: 06/22/19 Potential to Achieve Goals: Good ADL Goals Pt Will Perform Upper Body Dressing: with modified independence Pt Will Perform Lower Body Dressing: with modified independence;with adaptive equipment;sit to/from stand Pt Will Transfer to Toilet: with modified independence;bedside commode;ambulating Additional ADL Goal #1: pt will demonstrate 2 energy  conservation techniques for adls using handout  Plan Discharge plan remains appropriate       AM-PAC OT "6 Clicks" Daily Activity     Outcome Measure   Help from another person eating meals?: None Help from another person taking care of personal grooming?: A Little Help from another person toileting, which includes using toliet, bedpan, or urinal?: A Little Help from another person bathing (including washing, rinsing, drying)?: A Little Help from another person to put on and taking off regular upper body clothing?: A Little Help from another person to put on and taking off regular lower body clothing?: A Little 6 Click Score: 19    End of Session  OT Visit Diagnosis: Unsteadiness on feet (R26.81);Muscle weakness (generalized) (M62.81);Pain Pain - part of body: (abdomen)   Activity Tolerance Treatment limited secondary to medical complications (Comment)(persistent low BP)   Patient Left in bed;with call bell/phone within reach;with family/visitor present           Time: 0600-4599 OT Time Calculation (min): 23 min  Charges: OT General Charges $OT Visit: 1 Visit OT Treatments $Self Care/Home Management : 23-37 mins  Delbert Phenix OT OT office: [place] 06/13/2019, 1:45 PM

## 2019-06-13 NOTE — Progress Notes (Signed)
Calorie Count Note  48 hour calorie count ordered.  Diet: regular Supplements: Magic cup TID with meals, each supplement provides 290 kcal and 9 grams of protein; snacks TID  Pt sleeping soundly at time of visit. Per discussion with RN that evaluated him last, pt is a very selective eater.   06/12/19 Breakfast: nothing documented Lunch: 56 kcals, 4 grams protein Dinner: 119 kcals, 3 grams protein   Total intake: 175 kcal (7% of minimum estimated needs)  7 grams protein (28% of minimum estimated needs)  Nutrition Dx: Severe Malnutritionrelated to acute illness(pancreatitis causing N/V)as evidenced by moderate muscle depletion, severe muscle depletion, moderate fat depletion, percent weight loss; ongoing  Goal: Patient will meet greater than or equal to 90% of their needs; progressing   Intervention:   -Continue 48 hour calorie count per MD -Continue Magic cup TID with meals, each supplement provides 290 kcal and 9 grams of protein -Continue snacks TID -30 ml Prostat TID, each supplement provides 100 kcals and 15 grams protein  Steve Anthony, RD, LDN, [place] Registered Dietitian II Certified Diabetes Care and Education Specialist Please refer to AMION for RD and/or RD on-call/weekend/after hours pager

## 2019-06-14 DIAGNOSIS — K863 Pseudocyst of pancreas: Principal | ICD-10-CM

## 2019-06-14 DIAGNOSIS — E43 Unspecified severe protein-calorie malnutrition: Secondary | ICD-10-CM

## 2019-06-14 LAB — GLUCOSE, CAPILLARY
Glucose-Capillary: 124 mg/dL — ABNORMAL HIGH (ref 70–99)
Glucose-Capillary: 136 mg/dL — ABNORMAL HIGH (ref 70–99)
Glucose-Capillary: 141 mg/dL — ABNORMAL HIGH (ref 70–99)
Glucose-Capillary: 138 mg/dL — ABNORMAL HIGH (ref 70–99)

## 2019-06-14 LAB — BASIC METABOLIC PANEL
Anion gap: 10 (ref 5–15)
BUN: 10 mg/dL (ref 8–23)
CO2: 25 mmol/L (ref 22–32)
Calcium: 9.4 mg/dL (ref 8.9–10.3)
Chloride: 98 mmol/L (ref 98–111)
Creatinine, Ser: 1.19 mg/dL (ref 0.61–1.24)
GFR calc Af Amer: >60 mL/min (ref >60)
GFR calc non Af Amer: >60 mL/min (ref >60)
Glucose, Bld: 150 mg/dL — ABNORMAL HIGH (ref 70–99)
Potassium: 3.9 mmol/L (ref 3.5–5.1)
Sodium: 133 mmol/L — ABNORMAL LOW (ref 135–145)

## 2019-06-14 LAB — CBC
HCT: 31.3 % — ABNORMAL LOW (ref 39.0–52.0)
Hemoglobin: 10 g/dL — ABNORMAL LOW (ref 13.0–17.0)
MCH: 28.1 pg (ref 26.0–34.0)
MCHC: 31.9 g/dL (ref 30.0–36.0)
MCV: 87.9 fL (ref 80.0–100.0)
Platelets: 398 10*3/uL (ref 150–400)
RBC: 3.56 MIL/uL — ABNORMAL LOW (ref 4.22–5.81)
RDW: 15.9 % — ABNORMAL HIGH (ref 11.5–15.5)
WBC: 14.6 10*3/uL — ABNORMAL HIGH (ref 4.0–10.5)
nRBC: 0 % (ref 0.0–0.2)

## 2019-06-14 LAB — TYPE AND SCREEN
ABO/RH(D): AB POS
Antibody Screen: NEGATIVE
Unit division: 0

## 2019-06-14 LAB — BPAM RBC
Blood Product Expiration Date: 202106072359
ISSUE DATE / TIME: 202105191339
Unit Type and Rh: 8400

## 2019-06-14 MED ORDER — MENINGOCOCCAL A C Y&W-135 OLIG IM SOLR
0.5000 mL | Freq: Once | INTRAMUSCULAR | Status: AC
  Administered 2019-06-14: 0.5 mL via INTRAMUSCULAR
  Filled 2019-06-14: qty 0.5

## 2019-06-14 MED ORDER — SODIUM CHLORIDE 0.9 % IV BOLUS
250.0000 mL | Freq: Once | INTRAVENOUS | Status: AC
  Administered 2019-06-14: 250 mL via INTRAVENOUS

## 2019-06-14 MED ORDER — PNEUMOCOCCAL VAC POLYVALENT 25 MCG/0.5ML IJ INJ
0.5000 mL | INJECTION | INTRAMUSCULAR | Status: AC
  Administered 2019-06-15: 0.5 mL via INTRAMUSCULAR

## 2019-06-14 MED ORDER — FENOFIBRATE 160 MG PO TABS
160.0000 mg | ORAL_TABLET | Freq: Every day | ORAL | Status: DC
  Administered 2019-06-14 – 2019-06-18 (×5): 160 mg via ORAL
  Filled 2019-06-14 (×5): qty 1

## 2019-06-14 MED ORDER — HAEMOPHILUS B POLYSAC CONJ VAC 10 MCG IJ SOLR
0.5000 mL | Freq: Once | INTRAMUSCULAR | Status: DC
  Filled 2019-06-14 (×2): qty 0.5

## 2019-06-14 MED ORDER — MAGIC MOUTHWASH W/LIDOCAINE
5.0000 mL | Freq: Four times a day (QID) | ORAL | Status: DC | PRN
  Administered 2019-06-14 – 2019-06-15 (×3): 5 mL via ORAL
  Filled 2019-06-14 (×6): qty 5

## 2019-06-14 MED ORDER — APIXABAN 5 MG PO TABS
5.0000 mg | ORAL_TABLET | Freq: Two times a day (BID) | ORAL | Status: DC
  Administered 2019-06-14 – 2019-06-18 (×9): 5 mg via ORAL
  Filled 2019-06-14 (×9): qty 1

## 2019-06-14 NOTE — Progress Notes (Signed)
Physical Therapy Treatment Patient Details Name: Steve Anthony MRN: [id number] DOB: 1950/10/22 Today's Date: 06/14/2019    History of Present Illness 69 year old male who presents with acute pancreatitis. Pt has a complicated history involving pancreatitis and pancreatic pseudocysts, starting in August 2019. Pt with most recent infection in February 2021 with aspiration which grew enterococcus fecalis. Pt with nausea and vomiting with attempts at eating/drinking. Pt underwent Distal pancreatectomy, splenectomy, debridement of pancreatic necrosis on 06/08/19.    PT Comments    Mobility continues to be limited by hypotension, despite TED hose and abdominal binder donned. Ambulating to bathroom and back with a walker at a min guard assist level. Ended session with pt in supine position where BP ultimately rebounded (see below). Will continue efforts.   Orthostatic Vital Signs: Sitting in recliner: 65/49 (56) Standing: 73/57 (63) Supine post mobility: 70/55 (62) Supine 5 minutes post mobility: 99/70 (79)    Follow Up Recommendations  Home health PT;Supervision/Assistance - 24 hour     Equipment Recommendations  None recommended by PT    Recommendations for Other Services       Precautions / Restrictions Precautions Precautions: Fall Precaution Comments: 1 drain, watch BP Restrictions Weight Bearing Restrictions: No    Mobility  Bed Mobility Overal bed mobility: Needs Assistance Bed Mobility: Sit to Supine       Sit to supine: Supervision   General bed mobility comments: Supervision for lines  Transfers Overall transfer level: Needs assistance Equipment used: None Transfers: Sit to/from Stand Sit to Stand: Min guard            Ambulation/Gait Ambulation/Gait assistance: Min guard Gait Distance (Feet): 15 Feet Assistive device: Rolling walker (2 wheeled) Gait Pattern/deviations: Step-through pattern;Decreased stride length;Trunk flexed Gait velocity:  decreased   General Gait Details: Min guard for safety and Engineering geologist    Modified Rankin (Stroke Patients Only)       Balance Overall balance assessment: Needs assistance Sitting-balance support: Feet supported Sitting balance-Leahy Scale: Good     Standing balance support: No upper extremity supported Standing balance-Leahy Scale: Fair Standing balance comment: min guard for safety                            Cognition Arousal/Alertness: Awake/alert Behavior During Therapy: WFL for tasks assessed/performed Overall Cognitive Status: Within Functional Limits for tasks assessed                                        Exercises      General Comments        Pertinent Vitals/Pain Pain Assessment: 0-10 Pain Score: 8  Pain Location: abdomen Pain Descriptors / Indicators: Grimacing;Guarding Pain Intervention(s): Monitored during session    Home Living                      Prior Function            PT Goals (current goals can now be found in the care plan section) Acute Rehab PT Goals Patient Stated Goal: To improve mobility and reduce pain Potential to Achieve Goals: Good Progress towards PT goals: Progressing toward goals    Frequency    Min 3X/week      PT Plan Current plan  remains appropriate    Co-evaluation              AM-PAC PT "6 Clicks" Mobility   Outcome Measure  Help needed turning from your back to your side while in a flat bed without using bedrails?: None Help needed moving from lying on your back to sitting on the side of a flat bed without using bedrails?: None Help needed moving to and from a bed to a chair (including a wheelchair)?: A Little Help needed standing up from a chair using your arms (e.g., wheelchair or bedside chair)?: A Little Help needed to walk in hospital room?: A Little Help needed climbing 3-5 steps with a railing? : A  Lot 6 Click Score: 19    End of Session Equipment Utilized During Treatment: Gait belt Activity Tolerance: Treatment limited secondary to medical complications (Comment)(orthostatic) Patient left: in bed;with call bell/phone within reach;with family/visitor present Nurse Communication: Mobility status;Other (comment)(vitals) PT Visit Diagnosis: Unsteadiness on feet (R26.81);Other abnormalities of gait and mobility (R26.89);Muscle weakness (generalized) (M62.81);Pain Pain - part of body: (abdomen)     Time: 4492-0100 PT Time Calculation (min) (ACUTE ONLY): 28 min  Charges:  $Therapeutic Activity: 23-37 mins                       Wyona Almas, PT, DPT Acute Rehabilitation Services Pager [phone] Office [phone]    Deno Etienne 06/14/2019, 1:03 PM

## 2019-06-14 NOTE — Progress Notes (Addendum)
7 Days Post-Op   Subjective/Chief Complaint: wants to eat  No episodes of hypotension Eating is slightly better.  Got one additional unit of blood yesterday due to continued hypotension  Objective: Vital signs in last 24 hours: Temp:  [98.5 F (36.9 C)-99.5 F (37.5 C)] 98.9 F (37.2 C) (05/20 0411) Pulse Rate:  [61-71] 71 (05/20 0411) Resp:  [14-18] 15 (05/20 0411) BP: (103-126)/(69-85) 104/72 (05/20 0411) SpO2:  [93 %-100 %] 93 % (05/20 0411)    Intake/Output from previous day: 05/19 0701 - 05/20 0700 In: 1955 [P.O.:1375; I.V.:260; Blood:320] Out: 2325 [Urine:2275; Drains:50] Intake/Output this shift: Total I/O In: 240 [P.O.:240] Out: -   Gen: comfortable, no distress Neuro: non-focal exam HEENT: PERRL Neck: supple CV: RRR Pulm: unlabored breathing Abd: soft, appropriatelyTTP, incision c/d/i with staples, JP serosang.  No drainage or evidence of wound infection.  GU: clear yellow urine, foley Extr: wwp, trace edema  Lab Results:  Recent Labs    06/13/19 1250 06/14/19 0415  WBC 13.7* 14.6*  HGB 9.1* 10.0*  HCT 28.3* 31.3*  PLT 396 398   BMET Recent Labs    06/13/19 1250 06/14/19 0415  NA 132* 133*  K 4.3 3.9  CL 96* 98  CO2 25 25  GLUCOSE 147* 150*  BUN 8 10  CREATININE 1.10 1.19  CALCIUM 9.4 9.4   PT/INR No results for input(s): LABPROT, INR in the last 72 hours. ABG No results for input(s): PHART, HCO3 in the last 72 hours.  Invalid input(s): PCO2, PO2  Studies/Results: No results found.  Anti-infectives: Anti-infectives (From admission, onward)   Start     Dose/Rate Route Frequency Ordered Stop   06/07/19 1400  ciprofloxacin (CIPRO) IVPB 400 mg     400 mg 200 mL/hr over 60 Minutes Intravenous Every 12 hours 06/07/19 1356 06/07/19 1649   06/07/19 0600  ciprofloxacin (CIPRO) IVPB 400 mg     400 mg 200 mL/hr over 60 Minutes Intravenous On call to O.R. 06/07/19 0551 06/07/19 0806   06/07/19 0558  ciprofloxacin (CIPRO) 400 MG/200ML IVPB     Note to Pharmacy: Marga Melnick   : cabinet override      06/07/19 0558 06/07/19 0834      Assessment/Plan: s/p Procedure(s): OPEN DISTAL PANCREATECTOMY, DEBRIEDMENTOF PSEUDO PANCREATIC CYST (N/A) SPLENECTOMY (N/A)    ABL anemia - stable.   Thrombocytopenia - resolved.   Hypotension improved.    Hemorrhagic shock - resolved.   FEN -diet as tolerated.  Calorie count in progress. DM - SSI.  Restart eliquis.    PT/OT on board, continue Possibly home tomorrow.    ** remaining post splenectomy vaccines prior to d/c.**   LOS: 7 days    Stark Klein MD  06/14/2019

## 2019-06-14 NOTE — Progress Notes (Signed)
PROGRESS NOTE    Steve Anthony  MEQ:[id number] DOB: 1950/06/15 DOA: 06/07/2019 PCP: [name], MD    Brief Narrative:  69 y.o. male with medical history significant of hypertension, dyslipidemia, CAD s/p stent, paroxysmal atrial fibrillation on Eliquis, COPD, cryptogenic cirrhosis([place]), and recurrent pancreatitis with pseudocyst who presented for treatment of a recurrent infected pancreatic pseudocyst.  Patient underwent open distal pancreatectomy, splenectomy, and debridement of pancreatic necrosis on 5/13 by Dr. [name].  Following the procedure patient was noted to be in hemorrhagic shock which he was able to be weaned off neo.  Patient has continued to have intermittent low blood pressures despite receiving blood transfusions, IV fluids, albumin, and being started on midodrine.  Denies having any chest pain, palpitations, vomiting, or diarrhea.  Patient had actually been constipated up until this morning when he had a bowel movement.  After leaving the bathroom patient felt lightheaded as though he could pass out but never lost consciousness.  At baseline he reports having issues with his blood pressures being up and down, but never had to be on medication to keep his blood pressures up.  Assessment & Plan:   Active Problems:   Pancreatic pseudocyst   Infected pancreatic pseudocyst   Protein-calorie malnutrition, severe   Hypotension  1. Recurrent pancreatitis with pancreatic pseudocyst: s/p open distal pancreatectomy, debridement of pseudopancreatic cyst, and splenectomy on 5/13 by Dr. [name].  -     Per surgery, stable from surgery standponit  2. Recurrent hypotension: Acute. Found to have intermittent episodes of hypotension despite receiving blood, albumin, IV fluids, and midodrine. Patient does not appear to have adrenal insufficiency based off random cortisol level.  He does not want to be placed on any additional medications. -Pt is continued on midodrine 10 mg 3 times  daily -Pt was given trial of TED hose and abdominal binder  -BP overnight remained stable with SBP over 100's however in the afternoon, noted to be hypotensive with SBP in the 70's -seems to be fluid responsive, bp improved with 250cc IVF bolus -Will check orthostatic vitals  3. Acute blood loss anemia: Hemoglobin prior to procedure was around 10, but had dropped to a low of 7.6.  Patient transfused 1 unit of packed red blood cells on 5/19. -Post-op hgb appropriately corrected at 10  DVT prophylaxis: SCD's Code Status: Full Family Communication: Pt in room, family at bedside  Antimicrobials: Anti-infectives (From admission, onward)   Start     Dose/Rate Route Frequency Ordered Stop   06/07/19 1400  ciprofloxacin (CIPRO) IVPB 400 mg     400 mg 200 mL/hr over 60 Minutes Intravenous Every 12 hours 06/07/19 1356 06/07/19 1649   06/07/19 0600  ciprofloxacin (CIPRO) IVPB 400 mg     400 mg 200 mL/hr over 60 Minutes Intravenous On call to O.R. 06/07/19 0551 06/07/19 0806   06/07/19 0558  ciprofloxacin (CIPRO) 400 MG/200ML IVPB    Note to Pharmacy: Marga Melnick   : cabinet override      06/07/19 0558 06/07/19 0834       Subjective: Without complaints this AM. Feeling hungry  Objective: Vitals:   06/13/19 1400 06/13/19 1715 06/13/19 1954 06/14/19 0411  BP: 119/72 126/85 116/78 104/72  Pulse: 61 65 66 71  Resp: 15 17 14 15   Temp: 98.6 F (37 C) 98.5 F (36.9 C) 99.5 F (37.5 C) 98.9 F (37.2 C)  TempSrc: Oral Oral Oral Oral  SpO2: 99% 94% 100% 93%  Weight:      Height:  Intake/Output Summary (Last 24 hours) at 06/14/2019 0936 Last data filed at 06/14/2019 0500 Gross per 24 hour  Intake 1715 ml  Output 1925 ml  Net -210 ml   Filed Weights   06/07/19 0552 06/07/19 1500  Weight: 95.7 kg 95 kg    Examination:  General exam: Appears calm and comfortable  Respiratory system: Clear to auscultation. Respiratory effort normal. Cardiovascular system: S1 & S2 heard,  Regular Gastrointestinal system: Abdomen is nondistended, soft and nontender. No organomegaly or masses felt. Normal bowel sounds heard. Central nervous system: Alert and oriented. No focal neurological deficits. Extremities: Symmetric 5 x 5 power. Skin: No rashes, lesions Psychiatry: Judgement and insight appear normal. Mood & affect appropriate.   Data Reviewed: I have personally reviewed following labs and imaging studies  CBC: Recent Labs  Lab 06/10/19 0408 06/11/19 0455 06/12/19 0457 06/13/19 1250 06/14/19 0415  WBC 11.5* 8.6 7.4 13.7* 14.6*  HGB 8.3* 8.7* 8.3* 9.1* 10.0*  HCT 25.6* 27.4* 26.5* 28.3* 31.3*  MCV 87.4 88.1 88.9 87.1 87.9  PLT 201 310 316 396 161   Basic Metabolic Panel: Recent Labs  Lab 06/09/19 0423 06/09/19 0423 06/09/19 1849 06/09/19 1849 06/10/19 0408 06/11/19 0455 06/12/19 0457 06/13/19 1250 06/14/19 0415  NA 131*   < > 131*   < > 133* 134* 134* 132* 133*  K 4.5   < > 4.1   < > 3.9 3.9 3.4* 4.3 3.9  CL 103   < > 99   < > 102 99 98 96* 98  CO2 23   < > 25   < > 24 24 26 25 25   GLUCOSE 137*   < > 183*   < > 127* 131* 148* 147* 150*  BUN 7*   < > 6*   < > 5* 7* 6* 8 10  CREATININE 1.00   < > 0.99   < > 1.04 0.98 0.95 1.10 1.19  CALCIUM 8.1*   < > 8.3*   < > 8.5* 8.8* 9.1 9.4 9.4  MG 1.4*   < > 1.8  --  1.6* 1.7 1.3* 1.5*  --   PHOS 2.2*  --  4.0  --  3.4 3.8 3.2  --   --    < > = values in this interval not displayed.   GFR: Estimated Creatinine Clearance: 72.1 mL/min (by C-G formula based on SCr of 1.19 mg/dL). Liver Function Tests: No results for input(s): AST, ALT, ALKPHOS, BILITOT, PROT, ALBUMIN in the last 168 hours. No results for input(s): LIPASE, AMYLASE in the last 168 hours. No results for input(s): AMMONIA in the last 168 hours. Coagulation Profile: Recent Labs  Lab 06/07/19 1215 06/08/19 0431  INR 1.4* 1.1   Cardiac Enzymes: No results for input(s): CKTOTAL, CKMB, CKMBINDEX, TROPONINI in the last 168 hours. BNP (last 3  results) No results for input(s): PROBNP in the last 8760 hours. HbA1C: No results for input(s): HGBA1C in the last 72 hours. CBG: Recent Labs  Lab 06/13/19 0754 06/13/19 1147 06/13/19 1600 06/13/19 1959 06/14/19 0739  GLUCAP 125* 138* 169* 146* 124*   Lipid Profile: No results for input(s): CHOL, HDL, LDLCALC, TRIG, CHOLHDL, LDLDIRECT in the last 72 hours. Thyroid Function Tests: Recent Labs    06/13/19 1250  TSH 1.776   Anemia Panel: No results for input(s): VITAMINB12, FOLATE, FERRITIN, TIBC, IRON, RETICCTPCT in the last 72 hours. Sepsis Labs: No results for input(s): PROCALCITON, LATICACIDVEN in the last 168 hours.  Recent Results (from the  past 240 hour(s))  MRSA PCR Screening     Status: None   Collection Time: 06/07/19  6:00 PM   Specimen: Nasal Mucosa; Nasopharyngeal  Result Value Ref Range Status   MRSA by PCR NEGATIVE NEGATIVE Final    Comment:        The GeneXpert MRSA Assay (FDA approved for NASAL specimens only), is one component of a comprehensive MRSA colonization surveillance program. It is not intended to diagnose MRSA infection nor to guide or monitor treatment for MRSA infections. Performed at [facility], [place] [address], [place], [place] [address]      Radiology Studies: No results found.  Scheduled Meds: . sodium chloride   Intravenous Once  . Chlorhexidine Gluconate Cloth  6 each Topical Daily  . escitalopram  10 mg Oral Daily  . feeding supplement (PRO-STAT SUGAR FREE 64)  30 mL Oral TID  . furosemide  40 mg Oral Daily  . gabapentin  300 mg Oral Q12H  . insulin aspart  0-9 Units Subcutaneous TID AC & HS  . mouth rinse  15 mL Mouth Rinse BID  . melatonin  10 mg Oral QHS  . midodrine  10 mg Oral TID WC  . multivitamin with minerals  1 tablet Oral Daily  . pantoprazole  40 mg Oral QHS  . potassium chloride  20 mEq Oral BID  . sodium chloride flush  10-40 mL Intracatheter Q12H  . tamsulosin  0.4 mg Oral Daily  . tiZANidine  4  mg Oral BID  . traZODone  100 mg Oral QHS   Continuous Infusions: . sodium chloride Stopped (06/07/19 2338)  . dextrose 5 % and 0.45% NaCl Stopped (06/11/19 0458)     LOS: 7 days   Marylu Lund, MD [facility] Pager On Amion  If 7PM-7AM, please contact night-coverage 06/14/2019, 9:36 AM

## 2019-06-14 NOTE — Progress Notes (Addendum)
   06/14/19 1844  Assess: MEWS Score  Temp 98.3 F (36.8 C)  BP (!) 87/58  Pulse Rate 81  Resp 17  Level of Consciousness Alert  SpO2 93 %  O2 Device Room Air  Assess: MEWS Score  MEWS Temp 0  MEWS Systolic 1  MEWS Pulse 0  MEWS RR 1  MEWS LOC 0  MEWS Score 2  MEWS Score Color Yellow  Assess: if the MEWS score is Yellow or Red  Were vital signs taken at a resting state? Yes  Focused Assessment Documented focused assessment  Early Detection of Sepsis Score *See Row Information* Low  MEWS guidelines implemented *See Row Information* No, previously yellow, continue vital signs every 4 hours  Treat  MEWS Interventions Escalated (See documentation below);Other (Comment) (288m bolus per Dr. CEarlie Counts  Notify: Provider  Provider Name/Title Dr. CEarlie Counts Date Provider Notified 06/14/19  Time Provider Notified [phone] Notification Type Call  Notification Reason Other (Comment) (Low BP)  Response See new orders  Date of Provider Response 06/14/19  Time of Provider Response 1845   Dr. CWyline Copasnotified of patient's BP. 253mNS bolus ordered.

## 2019-06-14 NOTE — Plan of Care (Signed)
  Problem: Education: Goal: Knowledge of General Education information will improve Description Including pain rating scale, medication(s)/side effects and non-pharmacologic comfort measures Outcome: Progressing   

## 2019-06-14 NOTE — Progress Notes (Signed)
Calorie Count Note  48hour calorie count ordered.  Diet:regular Supplements:Magic cup TID with meals, each supplement provides 290 kcal and 9 grams of protein; snacks TID; 30 ml Prostat TID, each supplement provides 100 kcals and 15 grams protein  Intake has improved since yesterday. Noted family has brought in outside food. Per MD notes, possible plan to d/c home tomorrow.   06/12/19 Breakfast: nothing documented Lunch: 56 kcals, 4 grams protein Dinner: 119 kcals, 3 grams protein  Total intake: 175 kcal (7% of minimum estimated needs)  7 grams protein (28% of minimum estimated needs)  06/13/19 Breakfast: 158 kcals, 3 grams protein Lunch: 586 kcals, 16 grams protein Dinner: 631 kcals, 8 grams protein Supplements: 1 Prostat (100 kcals, 15 grams protein)   Total intake: 1475 kcal (61% of minimum estimated needs)  42 grams protein (35% of minimum estimated needs)  Nutrition QX:IHWTUU Malnutritionrelated to acute illness(pancreatitis causing N/V)as evidenced by moderate muscle depletion, severe muscle depletion, moderate fat depletion, percent weight loss; ongoing  Goal:Patient will meet greater than or equal to 90% of their needs; progressing  Intervention:  -D/c calorie count  -ContinueMagic cup TID with meals, each supplement provides 290 kcal and 9 grams of protein -Continue snacks TID -Continue 30 ml Prostat TID, each supplement provides 100 kcals and 15 grams protein  Loistine Chance, RD, LDN, [place] Registered Dietitian II Certified Diabetes Care and Education Specialist Please refer to AMION for RD and/or RD on-call/weekend/after hours pager

## 2019-06-14 NOTE — Progress Notes (Signed)
   06/14/19 1436  Assess: MEWS Score  Temp 97.6 F (36.4 C)  BP (!) 70/48  Pulse Rate (!) 56  Resp 16  Level of Consciousness Alert  SpO2 94 %  O2 Device Room Air  Patient Activity (if Appropriate) In bed  Assess: MEWS Score  MEWS Temp 0  MEWS Systolic 3  MEWS Pulse 0  MEWS RR 1  MEWS LOC 0  MEWS Score 4  MEWS Score Color Red  Assess: if the MEWS score is Yellow or Red  Were vital signs taken at a resting state? Yes  Focused Assessment Documented focused assessment  Early Detection of Sepsis Score *See Row Information* Low  MEWS guidelines implemented *See Row Information* Yes  Treat  MEWS Interventions Other (Comment) (Will recheck in 30 mins per MD)  Take Vital Signs  Increase Vital Sign Frequency  Red: Q 1hr X 4 then Q 4hr X 4, if remains red, continue Q 4hrs  Escalate  MEWS: Escalate Red: discuss with charge nurse/RN and provider, consider discussing with RRT  Notify: Charge Nurse/RN  Name of Charge Nurse/RN Notified Zee, RN  Date Charge Nurse/RN Notified 06/14/19  Time Charge Nurse/RN Notified 1441  Notify: Provider  Provider Name/Title Dr [name]  Date Provider Notified 06/14/19  Time Provider Notified 1440  Notification Type Call  Notification Reason Other (Comment) (Low BP)  Response No new orders;Other (Comment) (Recheck BP in 30 minutes)  Date of Provider Response 06/14/19  Time of Provider Response 1442  Notify: Rapid Response  Name of Rapid Response RN Notified Hella, RN  Date Rapid Response Notified 06/14/19  Time Rapid Response Notified 1518    3437 Received call from Dr. Earlie Counts stating to give patient 23m NS bolus and continue to monitor. Patient asymptomatic, resting in bed. Will continue to monitor.

## 2019-06-14 NOTE — Discharge Instructions (Addendum)
Information on my medicine - ELIQUIS (apixaban)  This medication education was reviewed with me or my healthcare representative as part of my discharge preparation.    Why was Eliquis prescribed for you? Eliquis was prescribed for you to reduce the risk of a blood clot forming that can cause a stroke if you have a medical condition called atrial fibrillation (a type of irregular heartbeat).  What do You need to know about Eliquis ? Take your Eliquis TWICE DAILY - one tablet in the morning and one tablet in the evening with or without food. If you have difficulty swallowing the tablet whole please discuss with your pharmacist how to take the medication safely.  Take Eliquis exactly as prescribed by your doctor and DO NOT stop taking Eliquis without talking to the doctor who prescribed the medication.  Stopping may increase your risk of developing a stroke.  Refill your prescription before you run out.  After discharge, you should have regular check-up appointments with your healthcare provider that is prescribing your Eliquis.  In the future your dose may need to be changed if your kidney function or weight changes by a significant amount or as you get older.  What do you do if you miss a dose? If you miss a dose, take it as soon as you remember on the same day and resume taking twice daily.  Do not take more than one dose of ELIQUIS at the same time to make up a missed dose.  Important Safety Information A possible side effect of Eliquis is bleeding. You should call your healthcare provider right away if you experience any of the following: ? Bleeding from an injury or your nose that does not stop. ? Unusual colored urine (red or dark brown) or unusual colored stools (red or black). ? Unusual bruising for unknown reasons. ? A serious fall or if you hit your head (even if there is no bleeding).  Some medicines may interact with Eliquis and might increase your risk of bleeding or  clotting while on Eliquis. To help avoid this, consult your healthcare provider or pharmacist prior to using any new prescription or non-prescription medications, including herbals, vitamins, non-steroidal anti-inflammatory drugs (NSAIDs) and supplements.  This website has more information on Eliquis (apixaban): http://www.eliquis.com/eliquis/home   [facility], [place] [phone]  ABDOMINAL SURGERY: POST OP INSTRUCTIONS  Always review your discharge instruction sheet given to you by the facility where your surgery was performed.  IF YOU HAVE DISABILITY OR FAMILY LEAVE FORMS, YOU MUST BRING THEM TO THE OFFICE FOR PROCESSING.  PLEASE DO NOT GIVE THEM TO YOUR DOCTOR.  1. A prescription for pain medication may be given to you upon discharge.  Take your pain medication as prescribed, if needed.  If narcotic pain medicine is not needed, then you may take acetaminophen (Tylenol) or ibuprofen (Advil) as needed. 2. Take your usually prescribed medications unless otherwise directed. 3. If you need a refill on your pain medication, please contact your pharmacy. They will contact our office to request authorization.  Prescriptions will not be filled after 5pm or on week-ends. 4. You should follow a light diet the first few days after arrival home, such as soup and crackers, pudding, etc.unless your doctor has advised otherwise. A high-fiber, low fat diet can be resumed as tolerated.   Be sure to include lots of fluids daily. Most patients will experience some swelling and bruising on the chest and neck area.  Ice packs  will help.  Swelling and bruising can take several days to resolve 5. Most patients will experience some swelling and bruising in the area of the incision. Ice pack will help. Swelling and bruising can take several days to resolve..  6. It is common to experience some constipation if taking pain medication after surgery.  Increasing fluid intake and taking a stool softener  will usually help or prevent this problem from occurring.  A mild laxative (Milk of Magnesia or Miralax) should be taken according to package directions if there are no bowel movements after 48 hours. 7.  You may have steri-strips (small skin tapes) in place directly over the incision.  These strips should be left on the skin for 10-14 days.  If your surgeon used skin glue on the incision, you may shower in 48 hours.  The glue will flake off over the next 2-3 weeks.  Any sutures or staples will be removed at the office during your follow-up visit. You may find that a light gauze bandage over your incision may keep your staples from being rubbed or pulled. You may shower and replace the bandage daily. 8. ACTIVITIES:  You may resume regular (light) daily activities beginning the next day--such as daily self-care, walking, climbing stairs--gradually increasing activities as tolerated.  You may have sexual intercourse when it is comfortable.  Refrain from any heavy lifting or straining until approved by your doctor. a. You may drive when you no longer are taking prescription pain medication, you can comfortably wear a seatbelt, and you can safely maneuver your car and apply brakes b. Return to Work: __________16 weeks if applicable_________________________ 9. You should see your doctor in the office for a follow-up appointment approximately two weeks after your surgery.  Make sure that you call for this appointment within a day or two after you arrive home to insure a convenient appointment time. OTHER INSTRUCTIONS:  _____________________________________________________________ _____________________________________________________________  WHEN TO CALL YOUR DOCTOR: 1. Fever over 101.0 2. Inability to urinate 3. Nausea and/or vomiting 4. Extreme swelling or bruising 5. Continued bleeding from incision. 6. Increased pain, redness, or drainage from the incision. 7. Difficulty swallowing or  breathing 8. Muscle cramping or spasms. 9. Numbness or tingling in hands or feet or around lips.  The clinic staff is available to answer your questions during regular business hours.  Please don't hesitate to call and ask to speak to one of the nurses if you have concerns.  For further questions, please visit [email or web address]

## 2019-06-15 ENCOUNTER — Inpatient Hospital Stay (HOSPITAL_COMMUNITY): Payer: Medicare PPO

## 2019-06-15 DIAGNOSIS — I959 Hypotension, unspecified: Secondary | ICD-10-CM

## 2019-06-15 DIAGNOSIS — I480 Paroxysmal atrial fibrillation: Secondary | ICD-10-CM

## 2019-06-15 LAB — CBC
HCT: 32.2 % — ABNORMAL LOW (ref 39.0–52.0)
Hemoglobin: 10.1 g/dL — ABNORMAL LOW (ref 13.0–17.0)
MCH: 27.8 pg (ref 26.0–34.0)
MCHC: 31.4 g/dL (ref 30.0–36.0)
MCV: 88.7 fL (ref 80.0–100.0)
Platelets: 481 10*3/uL — ABNORMAL HIGH (ref 150–400)
RBC: 3.63 MIL/uL — ABNORMAL LOW (ref 4.22–5.81)
RDW: 16.1 % — ABNORMAL HIGH (ref 11.5–15.5)
WBC: 11.1 10*3/uL — ABNORMAL HIGH (ref 4.0–10.5)
nRBC: 0 % (ref 0.0–0.2)

## 2019-06-15 LAB — BASIC METABOLIC PANEL
Anion gap: 9 (ref 5–15)
BUN: 13 mg/dL (ref 8–23)
CO2: 24 mmol/L (ref 22–32)
Calcium: 9 mg/dL (ref 8.9–10.3)
Chloride: 99 mmol/L (ref 98–111)
Creatinine, Ser: 1.07 mg/dL (ref 0.61–1.24)
GFR calc Af Amer: >60 mL/min (ref >60)
GFR calc non Af Amer: >60 mL/min (ref >60)
Glucose, Bld: 149 mg/dL — ABNORMAL HIGH (ref 70–99)
Potassium: 3.9 mmol/L (ref 3.5–5.1)
Sodium: 132 mmol/L — ABNORMAL LOW (ref 135–145)

## 2019-06-15 LAB — MAGNESIUM: Magnesium: 1.4 mg/dL — ABNORMAL LOW (ref 1.7–2.4)

## 2019-06-15 LAB — GLUCOSE, CAPILLARY
Glucose-Capillary: 151 mg/dL — ABNORMAL HIGH (ref 70–99)
Glucose-Capillary: 155 mg/dL — ABNORMAL HIGH (ref 70–99)
Glucose-Capillary: 123 mg/dL — ABNORMAL HIGH (ref 70–99)

## 2019-06-15 MED ORDER — IOHEXOL 300 MG/ML  SOLN
100.0000 mL | Freq: Once | INTRAMUSCULAR | Status: AC | PRN
  Administered 2019-06-15: 100 mL via INTRAVENOUS

## 2019-06-15 MED ORDER — ALBUMIN HUMAN 5 % IV SOLN
25.0000 g | Freq: Once | INTRAVENOUS | Status: AC
  Administered 2019-06-15: 25 g via INTRAVENOUS
  Filled 2019-06-15: qty 500

## 2019-06-15 MED ORDER — AMIODARONE HCL IN DEXTROSE 360-4.14 MG/200ML-% IV SOLN
60.0000 mg/h | INTRAVENOUS | Status: DC
  Administered 2019-06-15: 60 mg/h via INTRAVENOUS
  Filled 2019-06-15: qty 200

## 2019-06-15 MED ORDER — AMIODARONE HCL IN DEXTROSE 360-4.14 MG/200ML-% IV SOLN
30.0000 mg/h | INTRAVENOUS | Status: DC
  Administered 2019-06-15: 30 mg/h via INTRAVENOUS
  Filled 2019-06-15 (×2): qty 200

## 2019-06-15 MED ORDER — AMIODARONE IV BOLUS ONLY 150 MG/100ML
150.0000 mg | Freq: Once | INTRAVENOUS | Status: AC
  Administered 2019-06-15: 150 mg via INTRAVENOUS
  Filled 2019-06-15: qty 100

## 2019-06-15 MED ORDER — SODIUM CHLORIDE 0.9 % IV BOLUS
500.0000 mL | Freq: Once | INTRAVENOUS | Status: AC
  Administered 2019-06-15: 500 mL via INTRAVENOUS

## 2019-06-15 MED ORDER — PANTOPRAZOLE SODIUM 40 MG PO TBEC
40.0000 mg | DELAYED_RELEASE_TABLET | Freq: Two times a day (BID) | ORAL | Status: DC
  Administered 2019-06-15 – 2019-06-18 (×7): 40 mg via ORAL
  Filled 2019-06-15 (×7): qty 1

## 2019-06-15 MED ORDER — LABETALOL HCL 5 MG/ML IV SOLN
5.0000 mg | INTRAVENOUS | Status: DC | PRN
  Filled 2019-06-15: qty 4

## 2019-06-15 MED ORDER — MAGNESIUM SULFATE 4 GM/100ML IV SOLN
4.0000 g | Freq: Once | INTRAVENOUS | Status: AC
  Administered 2019-06-15: 4 g via INTRAVENOUS
  Filled 2019-06-15: qty 100

## 2019-06-15 MED ORDER — HAEMOPHILUS B POLYSAC CONJ VAC 10 MCG IJ SOLR
0.5000 mL | Freq: Once | INTRAMUSCULAR | Status: AC
  Administered 2019-06-15: 0.5 mL via INTRAMUSCULAR
  Filled 2019-06-15: qty 0.5

## 2019-06-15 MED ORDER — PROMETHAZINE HCL 25 MG PO TABS: 25.0000 mg | ORAL_TABLET | Freq: Three times a day (TID) | ORAL | Status: DC | PRN

## 2019-06-15 MED ORDER — METOPROLOL TARTRATE 12.5 MG HALF TABLET
12.5000 mg | ORAL_TABLET | Freq: Two times a day (BID) | ORAL | Status: DC
  Administered 2019-06-15 – 2019-06-18 (×6): 12.5 mg via ORAL
  Filled 2019-06-15 (×6): qty 1

## 2019-06-15 MED ORDER — IOHEXOL 9 MG/ML PO SOLN
500.0000 mL | ORAL | Status: AC
  Administered 2019-06-15 (×2): 500 mL via ORAL

## 2019-06-15 NOTE — Significant Event (Signed)
Rapid Response Event Note  Overview: Time Called: 0913 Arrival Time: 0918 Event Type: Cardiac  Initial Focused Assessment: Patient with RAF this AM  HR 120-170s.  He denies any symptoms, chest pain or shortness of breath.  He is resting comfortably in the bed.  Lungs sounds clear, decreased bases,  Heart tones irregular.  BP 103/73  AF 156  RR 16-19  O2 sat 92% on RA  Interventions: Amiodarone gtt ordered:  BP rechecked 78/61, 71/60  AF 120-150 Amiodarone bolus and gtt held 500 cc NS bolus infused 2nd PIV started BP 100/79    Transported to 4NP07, via bed.    Patient converted to SR for a short period of time.  BP recheck upon arrival.  79/66  SR 93 Converted back to AF rate 130-140s Albumin infused.  1210: Amiodarone bolus and gtt started  Patient drinking 2nd bottle of oral contrast.  Wife at bedside entire morning.   Plan of Care (if not transferred): RN to call if patient becomes symptomatic with his AF or hypotension.  Event Summary: Name of Physician Notified: Dr Stark Klein prior to my arrival at      at    Outcome: Transferred (Comment)  Event End Time: [place]  Raliegh Ip

## 2019-06-15 NOTE — Progress Notes (Signed)
Pt arrived to 4NP along with SWOT, RRT, and wife accompanying.

## 2019-06-15 NOTE — Progress Notes (Signed)
PROGRESS NOTE    Steve Anthony  BEM:[id number] DOB: 04/18/1950 DOA: 06/07/2019 PCP: [name], MD    Brief Narrative:  69 y.o. male with medical history significant of hypertension, dyslipidemia, CAD s/p stent, paroxysmal atrial fibrillation on Eliquis, COPD, cryptogenic cirrhosis([place]), and recurrent pancreatitis with pseudocyst who presented for treatment of a recurrent infected pancreatic pseudocyst.  Patient underwent open distal pancreatectomy, splenectomy, and debridement of pancreatic necrosis on 5/13 by Dr. [name].  Following the procedure patient was noted to be in hemorrhagic shock which he was able to be weaned off neo.  Patient has continued to have intermittent low blood pressures despite receiving blood transfusions, IV fluids, albumin, and being started on midodrine.  Denies having any chest pain, palpitations, vomiting, or diarrhea.  Patient had actually been constipated up until this morning when he had a bowel movement.  After leaving the bathroom patient felt lightheaded as though he could pass out but never lost consciousness.  At baseline he reports having issues with his blood pressures being up and down, but never had to be on medication to keep his blood pressures up.  Assessment & Plan:   Active Problems:   Pancreatic pseudocyst   Infected pancreatic pseudocyst   Protein-calorie malnutrition, severe   Hypotension  1. Recurrent pancreatitis with pancreatic pseudocyst: s/p open distal pancreatectomy, debridement of pseudopancreatic cyst, and splenectomy on 5/13 by Dr. [name].  -    Cont management per General Surgery  2. Recurrent hypotension: Acute. Found to have intermittent episodes of hypotension despite receiving blood, albumin, IV fluids, and midodrine. Patient does not appear to have adrenal insufficiency based off random cortisol level.  He does not want to be placed on any additional medications. -Pt is continued on midodrine 10 mg 3 times daily -Pt was  given trial of TED hose and abdominal binder  -BP seems to fluctuate from normotension and hypotension with sbp to the 70's. Pt remains asymptomatic during these episodes -Chart reviewed, and seems to be an on-going issues, known in the outpatient setting. Of note, one one prior Cardiology visit, pt was advised to briefly hold lasix given hypotension in the office with sbp in the 70's  3. Acute blood loss anemia: Hemoglobin prior to procedure was around 10, but had dropped to a low of 7.6.  Patient transfused 1 unit of packed red blood cells on 5/19. -Post-op hgb appropriately corrected at 10  4. Afib RVR -New finding this AM. Pt had been previously rate controlled -This AM, noted to have HR in the 160's with concurrent hypotension -Was unable to give beta blocker given sbp in the 70's, thus Amiodarone gtt started and pt was moved to stepdown -Pt also found to have low Mg. Corrected -Cardiology was consulted. Appreciate recs. Recommendation to continue IV amiodarone as already ordered, and metoprolol tartrate 12.55m when able to as long as sbp >90  5. Hypomagnesemia - Replaced -Would repeat lytes in the AM  DVT prophylaxis: SCD's Code Status: Full Family Communication: Pt in room, family at bedside  Antimicrobials: Anti-infectives (From admission, onward)   Start     Dose/Rate Route Frequency Ordered Stop   06/07/19 1400  ciprofloxacin (CIPRO) IVPB 400 mg     400 mg 200 mL/hr over 60 Minutes Intravenous Every 12 hours 06/07/19 1356 06/07/19 1649   06/07/19 0600  ciprofloxacin (CIPRO) IVPB 400 mg     400 mg 200 mL/hr over 60 Minutes Intravenous On call to O.R. 06/07/19 0071205/13/21 01975  06/07/19 08832  ciprofloxacin (CIPRO) 400 MG/200ML IVPB    Note to Pharmacy: Marga Melnick   : cabinet override      06/07/19 0558 06/07/19 0834      Subjective: Feeling frustrated that he is now in afib RVR  Objective: Vitals:   06/15/19 1304 06/15/19 1341 06/15/19 1354 06/15/19 1634  BP:  104/88  107/77 93/70  Pulse: 99     Resp: 15 18 16 16   Temp:   98.4 F (36.9 C) 98 F (36.7 C)  TempSrc:   Oral Oral  SpO2: 94%  93% 92%  Weight:      Height:        Intake/Output Summary (Last 24 hours) at 06/15/2019 1834 Last data filed at 06/15/2019 1304 Gross per 24 hour  Intake 240 ml  Output 1060 ml  Net -820 ml   Filed Weights   06/07/19 0552 06/07/19 1500 06/14/19 1858  Weight: 95.7 kg 95 kg 89.3 kg    Examination: General exam: Awake, laying in bed, in nad Respiratory system: Normal respiratory effort, no wheezing Cardiovascular system: tachycardic, s1, s2, irregularly irregular Gastrointestinal system: Soft, nondistended, positive BS Central nervous system: CN2-12 grossly intact, strength intact Extremities: Perfused, no clubbing Skin: Normal skin turgor, no notable skin lesions seen Psychiatry: Mood normal // no visual hallucinations   Data Reviewed: I have personally reviewed following labs and imaging studies  CBC: Recent Labs  Lab 06/11/19 0455 06/12/19 0457 06/13/19 1250 06/14/19 0415 06/15/19 0421  WBC 8.6 7.4 13.7* 14.6* 11.1*  HGB 8.7* 8.3* 9.1* 10.0* 10.1*  HCT 27.4* 26.5* 28.3* 31.3* 32.2*  MCV 88.1 88.9 87.1 87.9 88.7  PLT 310 316 396 398 295*   Basic Metabolic Panel: Recent Labs  Lab 06/09/19 0423 06/09/19 0423 06/09/19 1849 06/09/19 1849 06/10/19 0408 06/10/19 0408 06/11/19 0455 06/12/19 0457 06/13/19 1250 06/14/19 0415 06/15/19 0421 06/15/19 1011  NA 131*   < > 131*   < > 133*   < > 134* 134* 132* 133* 132*  --   K 4.5   < > 4.1   < > 3.9   < > 3.9 3.4* 4.3 3.9 3.9  --   CL 103   < > 99   < > 102   < > 99 98 96* 98 99  --   CO2 23   < > 25   < > 24   < > 24 26 25 25 24   --   GLUCOSE 137*   < > 183*   < > 127*   < > 131* 148* 147* 150* 149*  --   BUN 7*   < > 6*   < > 5*   < > 7* 6* 8 10 13   --   CREATININE 1.00   < > 0.99   < > 1.04   < > 0.98 0.95 1.10 1.19 1.07  --   CALCIUM 8.1*   < > 8.3*   < > 8.5*   < > 8.8* 9.1 9.4  9.4 9.0  --   MG 1.4*   < > 1.8   < > 1.6*  --  1.7 1.3* 1.5*  --   --  1.4*  PHOS 2.2*  --  4.0  --  3.4  --  3.8 3.2  --   --   --   --    < > = values in this interval not displayed.   GFR: Estimated Creatinine Clearance: 73.5 mL/min (by C-G formula based on SCr  of 1.07 mg/dL). Liver Function Tests: No results for input(s): AST, ALT, ALKPHOS, BILITOT, PROT, ALBUMIN in the last 168 hours. No results for input(s): LIPASE, AMYLASE in the last 168 hours. No results for input(s): AMMONIA in the last 168 hours. Coagulation Profile: No results for input(s): INR, PROTIME in the last 168 hours. Cardiac Enzymes: No results for input(s): CKTOTAL, CKMB, CKMBINDEX, TROPONINI in the last 168 hours. BNP (last 3 results) No results for input(s): PROBNP in the last 8760 hours. HbA1C: No results for input(s): HGBA1C in the last 72 hours. CBG: Recent Labs  Lab 06/14/19 1704 06/14/19 2147 06/15/19 0754 06/15/19 1144 06/15/19 1635  GLUCAP 141* 138* 151* 155* 123*   Lipid Profile: No results for input(s): CHOL, HDL, LDLCALC, TRIG, CHOLHDL, LDLDIRECT in the last 72 hours. Thyroid Function Tests: Recent Labs    06/13/19 1250  TSH 1.776   Anemia Panel: No results for input(s): VITAMINB12, FOLATE, FERRITIN, TIBC, IRON, RETICCTPCT in the last 72 hours. Sepsis Labs: No results for input(s): PROCALCITON, LATICACIDVEN in the last 168 hours.  Recent Results (from the past 240 hour(s))  MRSA PCR Screening     Status: None   Collection Time: 06/07/19  6:00 PM   Specimen: Nasal Mucosa; Nasopharyngeal  Result Value Ref Range Status   MRSA by PCR NEGATIVE NEGATIVE Final    Comment:        The GeneXpert MRSA Assay (FDA approved for NASAL specimens only), is one component of a comprehensive MRSA colonization surveillance program. It is not intended to diagnose MRSA infection nor to guide or monitor treatment for MRSA infections. Performed at [facility], [place] [address],  [place], [place] [address]      Radiology Studies: CT ABDOMEN PELVIS W CONTRAST  Result Date: 06/15/2019 CLINICAL DATA:  Status post distal pancreatectomy, splenectomy and debridement of pancreatic necrosis on 06/07/2019. Question abscess. EXAM: CT ABDOMEN AND PELVIS WITH CONTRAST TECHNIQUE: Multidetector CT imaging of the abdomen and pelvis was performed using the standard protocol following bolus administration of intravenous contrast. CONTRAST:  168m OMNIPAQUE IOHEXOL 300 MG/ML  SOLN COMPARISON:  03/29/2019 FINDINGS: Lower chest: Dependent atelectasis in the lung bases. Hepatobiliary: No suspicious focal abnormality within the liver parenchyma. Gallbladder surgically absent. No intrahepatic or extrahepatic biliary dilation. Pancreas: Status post distal pancreatectomy. Large complex fluid collection seen on the previous study is no longer evident. 9.6 x 1.8 x 1.9 cm tubular shaped rim enhancing fluid collection is identified in the pancreatectomy bed. Spleen: Splenectomy with edema and small (1-2 cm) scattered unorganized fluid collections in the nephrectomy bed. Adrenals/Urinary Tract: No adrenal nodule or mass. Stable bilateral renal cysts. Tiny hypoattenuating lesions in both kidneys identified, too small to characterize but likely benign. No evidence for hydroureter. The urinary bladder appears normal for the degree of distention. Stomach/Bowel: Stomach is nondistended. Posterior fundal diverticulum evident. Duodenal diverticulum again noted. No small bowel wall thickening. No small bowel dilatation. The terminal ileum is normal. The appendix is normal. Right and transverse colon distended with gas and contrast material. Left colon is nondistended with left colonic diverticulosis evident. Vascular/Lymphatic: There is abdominal aortic atherosclerosis without aneurysm. There is no gastrohepatic or hepatoduodenal ligament lymphadenopathy. No retroperitoneal or mesenteric lymphadenopathy. No pelvic sidewall  lymphadenopathy. Reproductive: The prostate gland and seminal vesicles are unremarkable. Other: There is trace intraperitoneal free air under the anterior abdominal wall, not unexpected on postoperative day 8. No substantial intraperitoneal free fluid. There is some trace fluid in the left paracolic gutter. Small 4.2 x  2.0 cm fluid collection in the gastrocolic ligament shows no rim enhancement (29/9). Trace fluid noted at the inferior tip of the right liver. Musculoskeletal: No worrisome lytic or sclerotic osseous abnormality. Status post right hip replacement. Lumbar fusion hardware noted. IMPRESSION: 1. Status post distal pancreatectomy and splenectomy. 9.6 x 1.8 x 1.9 cm tubular shaped rim enhancing fluid collection is identified in the pancreatectomy bed. This has attenuation higher than would be expected for serous fluid and may represent a hematoma. Although no gas is seen in the collection, given the rim enhancement, superinfection cannot be excluded. 2. Scattered small fluid collections in the gastrocolic ligament and splenectomy bed show no organization/rim enhancement. 3. Trace intraperitoneal free gas, not unexpected on postoperative day 8. There is trace fluid in the peritoneal cavity. 4. Distention of the ascending and transverse colon. Left colon is nondistended but is not decompressed. There is no associated small bowel dilatation. Component of ileus is a consideration. 5. Bilateral renal cysts. 6. Left colonic diverticulosis without diverticulitis. 7. Aortic Atherosclerosis (ICD10-I70.0). Electronically Signed   By: Misty Stanley M.D.   On: 06/15/2019 16:22    Scheduled Meds: . sodium chloride   Intravenous Once  . apixaban  5 mg Oral BID  . Chlorhexidine Gluconate Cloth  6 each Topical Daily  . escitalopram  10 mg Oral Daily  . feeding supplement (PRO-STAT SUGAR FREE 64)  30 mL Oral TID  . fenofibrate  160 mg Oral Daily  . gabapentin  300 mg Oral Q12H  . insulin aspart  0-9 Units  Subcutaneous TID AC & HS  . mouth rinse  15 mL Mouth Rinse BID  . melatonin  10 mg Oral QHS  . metoprolol tartrate  12.5 mg Oral BID  . midodrine  10 mg Oral TID WC  . multivitamin with minerals  1 tablet Oral Daily  . pantoprazole  40 mg Oral BID  . potassium chloride  20 mEq Oral BID  . sodium chloride flush  10-40 mL Intracatheter Q12H  . tamsulosin  0.4 mg Oral Daily  . tiZANidine  4 mg Oral BID  . traZODone  100 mg Oral QHS   Continuous Infusions: . sodium chloride Stopped (06/07/19 2338)  . amiodarone 30 mg/hr (06/15/19 1801)     LOS: 8 days   Marylu Lund, MD [facility] Pager On Amion  If 7PM-7AM, please contact night-coverage 06/15/2019, 6:34 PM

## 2019-06-15 NOTE — Progress Notes (Signed)
OT Cancellation Note  Patient Details Name: Steve Anthony MRN: [id number] DOB: [birth year]   Cancelled Treatment:    Reason Eval/Treat Not Completed: Medical issues which prohibited therapy;Other (comment) Per RN pt in Afib likely to return to progressive unit. Will check back for OT session as pt medically stable.   Lanier Clam., COTA/L Acute Rehabilitation Services [phone] [place] 06/15/2019, 9:37 AM

## 2019-06-15 NOTE — Progress Notes (Signed)
Report called to Chrys Racer, RN on 4NP. Patient transferred via bed by Kelli Churn, RRT and Larina Earthly RN.

## 2019-06-15 NOTE — Progress Notes (Signed)
8 Days Post-Op   Subjective/Chief Complaint:  Pt had additional episode of hypotension yesterday. He got a small 250 mL bolus which improved matters.  He did better the rest of the day.  However, this AM, he had HR in the 140s and EKG c/w afib with RVR.  He was transferred to stepdown and cardiology was consulted.    PO intake still not great.    Objective: Vital signs in last 24 hours: Temp:  [97.4 F (36.3 C)-99.3 F (37.4 C)] 97.4 F (36.3 C) (05/21 1140) Pulse Rate:  [56-148] 99 (05/21 1304) Resp:  [12-20] 15 (05/21 1304) BP: (70-114)/(48-88) 104/88 (05/21 1304) SpO2:  [90 %-95 %] 94 % (05/21 1304) Weight:  [89.3 kg] 89.3 kg (05/20 1858) Last BM Date: 06/14/19  Intake/Output from previous day: 05/20 0701 - 05/21 0700 In: 515 [P.O.:480; IV Piggyback:35] Out: 910 [Urine:900; Drains:10] Intake/Output this shift: No intake/output data recorded.  Gen: comfortable, no distress Neuro: non-focal exam HEENT: PERRL Neck: supple CV: RRR Pulm: unlabored breathing Abd: soft, appropriatelyTTP, incision c/d/i with staples, JP serosang.  No drainage or evidence of wound infection. Second drain with minimal serous output.   GU: clear yellow urine, foley Extr: wwp, trace edema  Lab Results:  Recent Labs    06/14/19 0415 06/15/19 0421  WBC 14.6* 11.1*  HGB 10.0* 10.1*  HCT 31.3* 32.2*  PLT 398 481*   BMET Recent Labs    06/14/19 0415 06/15/19 0421  NA 133* 132*  K 3.9 3.9  CL 98 99  CO2 25 24  GLUCOSE 150* 149*  BUN 10 13  CREATININE 1.19 1.07  CALCIUM 9.4 9.0   PT/INR No results for input(s): LABPROT, INR in the last 72 hours. ABG No results for input(s): PHART, HCO3 in the last 72 hours.  Invalid input(s): PCO2, PO2  Studies/Results: No results found.  Anti-infectives: Anti-infectives (From admission, onward)   Start     Dose/Rate Route Frequency Ordered Stop   06/07/19 1400  ciprofloxacin (CIPRO) IVPB 400 mg     400 mg 200 mL/hr over 60 Minutes  Intravenous Every 12 hours 06/07/19 1356 06/07/19 1649   06/07/19 0600  ciprofloxacin (CIPRO) IVPB 400 mg     400 mg 200 mL/hr over 60 Minutes Intravenous On call to O.R. 06/07/19 8329 06/07/19 0806   06/07/19 0558  ciprofloxacin (CIPRO) 400 MG/200ML IVPB    Note to Pharmacy: Marga Melnick   : cabinet override      06/07/19 0558 06/07/19 0834      Assessment/Plan: s/p Procedure(s): OPEN DISTAL PANCREATECTOMY, DEBRIEDMENTOF PSEUDO PANCREATIC CYST (N/A) SPLENECTOMY (N/A)  Afib- cards consult and transfer to stepdown.   ABL anemia - stable.   Thrombocytopenia - resolved.   Hypotension improved, but still marginal BPs part of the day.  This will make it difficult to control atrial fibrillation.    Hemorrhagic shock - resolved.   FEN -diet as tolerated.  Calorie count in progress. DM - SSI.  Restart eliquis.    PT/OT on board, continue  Unclear d/c date at this point.    Given tachycardia, will get CT to make sure nothing is going on in the abdomen.       LOS: 8 days    Stark Klein MD  06/15/2019

## 2019-06-15 NOTE — Progress Notes (Signed)
   06/14/19 2257  Assess: MEWS Score  Temp 99.2 F (37.3 C)  BP 105/71  Pulse Rate 69  Resp 14  SpO2 90 %  O2 Device Room Air  Assess: MEWS Score  MEWS Temp 0  MEWS Systolic 0  MEWS Pulse 0  MEWS RR 0  MEWS LOC 0  MEWS Score 0  MEWS Score Color Green  Document  Patient Outcome Stabilized after interventions  Progress note created (see row info) Yes

## 2019-06-15 NOTE — Consult Note (Addendum)
Cardiology Consultation:   Patient ID: [id number] DUSEK; [id number]; 05/09/1950   Admit date: 06/07/2019 Date of Consult: 06/15/2019  Primary Care Provider: [name], MD Primary Cardiologist/Electrophysiologist: Dr. Thompson Grayer, MD  Patient Profile:   Steve Anthony is a 69 y.o. male with a hx of paroxysmal atrial fibrillation  (status post CTI/PVI ablation 02/2009 and 09/2009) on Eliquis therapy, CAD status post PCI to LAD in 2006 with repeat LHC 10/2012 with stable CAD and patent stent, COPD, HTN, HLD, hyperthyroidism, OSA (not on CPAP),  DM2, history of SVT (AVNRT slow pathway ablated 2007), recurrent pancreatitis and pancreatic pseudocysts who is being seen today for the evaluation of atrial fibrillation with RVR at the request of Dr. [name].  History of Present Illness:   Steve Anthony is a 69 year old male with a history stated above who presented to [facility] 06/04/2019 for treatment of recurrent infected pancreatic pseudocyst. Per chart review, Steve Anthony has a complicated history involving pancreatitis and pancreatic pseudocysts who has been admitted several times over the past two years for similar issues. Pancreatitis initially began in 2019 and has been followed by GI (Dr. Lyndel Safe) for cirrhosis and splenomegaly.  Also with a history of infection of the pseudocyst with last infectious episode 02/2019 at which time aspiration grew Enterococcus fecalis. Steve Anthony ultimately had PICC line placement with IV antibiotic therapy.  Steve Anthony was then seen by Dr. [name] with several plan options including possible distal pancreatomy with splenectomy versus open excision with large for drain. Patient went for open distal pancreatomy with debridement of pseudopancreatic cyst with splenectomy 06/07/2019 per Dr. [name].  In the postoperative setting, patient initially had issues with hemorrhagic shock and was placed on Neo-Synephrine. Also issues with thrombocytopenia and anemia. Over the course of Steve Anthony stay, Steve Anthony has been working  with PT/OT and working with advancing Steve Anthony diet. Anemia stabilized and thrombocytopenia has resolved. Continues to have episodic hypotension despite midodrine administration. Has been transfused with multiple PRBCs.   On 06/13/2019 hospitalist service consulted potential. Plan was to continue midodrine 10 mg 3 times daily as well as TED hose and abdominal binder.  On 06/15/2019 patient was found to be in atrial fibrillation with RVR with heart rates in the 140 range. Steve Anthony was transferred to stepdown with cardiology consultation. On my interview, Steve Anthony states that Steve Anthony BP has been chronically low and Steve Anthony is meticulous with tracking this. Reports Steve Anthony can tell when Steve Anthony is in AF but has no SOB or significant palpitations. No HF symptoms. Prefers to restart home regimen and monitor response. Given Steve Anthony low BP during Steve Anthony hospital course, BB has been held and/or discontinued.   Per chart review, patient was seen in EP follow-up/preoperative clearance 06/05/2019. At that time, Steve Anthony had seen Steve Anthony PCP 05/24/2019 for right foot numbness at which time Steve Anthony BP was noted to be 78/44. It was recommended Steve Anthony go to the ED for hydration however Steve Anthony declined.  Steve Anthony Toprol was held for SBP less than 110. Patient was very anxious about getting Steve Anthony surgery performed and was concerned if Steve Anthony BP was so low this would get canceled.  It was very low at EP follow-up however Steve Anthony was asymptomatic. BP log dating back to January/February noted SBP's in the 80s and heart rates in the 90s.  Case was reviewed with Dr. Tamala Julian, DOD at which time Toprol was discontinued and Lopressor 12.5 mg twice daily was started for beta-blockade and cardiac protection.  Steve Anthony was ultimately cleared for surgery.  Past Medical History:  Diagnosis Date  . Anemia   . Arthritis   . Asthma   . CAD (coronary artery disease)    a. S/P PCI mid LAD (vision stent) 08/31/2004;  b. repeat cath 6/11 - no progression of CAD. c. Cath 10/2012 given moderate CAD on cardiac CT - no change from  prior cath.  . Chest pain 12/16/2016  . COPD (chronic obstructive pulmonary disease) ([place])   . COVID-19 virus infection   . Diverticulitis   . DJD (degenerative joint disease) of lumbar spine   . Dyslipidemia   . Gallstones   . GERD (gastroesophageal reflux disease)   . History of kidney stones   . Hypertension   . Hyperthyroidism   . Melanoma ([place])    melanoma removed from neck   . On home oxygen therapy    "2L q hs" (11/08/2012),  06/04/19 pt. not on oxygen  . OSA (obstructive sleep apnea)    a. cpap noncompliance- patient reports on 12/10/14- does not use CPAP  . Palpitations    a. 10/2012 - s/p LINQ loop recorder to assess for arrhythmia.   . Pancreatitis   . Paroxysmal A-fib ([place])    a. S/P PVI 2/11 and 9/11  . PICC (peripherally inserted central catheter) in place   . Pneumonia   . SVT (supraventricular tachycardia) ([place])    a. Atypical AVNRT of the slow pathway  . Type II diabetes mellitus ([place])     Past Surgical History:  Procedure Laterality Date  . ATRIAL ABLATION SURGERY  2007   S/P slow pathway ablation for atypical AVNRT   . BIOPSY  07/17/2018   Procedure: BIOPSY;  Surgeon: Rush Landmark Telford Nab., MD;  Location: [place];  Service: Gastroenterology;;  . [facility]  ~ 2008  . COLON RESECTION  02/2013   . COLONOSCOPY  12/25/2007   Colonic polyp status post polypectomy. Internal hemorrhoids.   . CORONARY ANGIOPLASTY WITH STENT PLACEMENT  08/30/2004   Vision; to LAD   . CYSTOSCOPY W/ STONE MANIPULATION  ~ 1998   "once" (11/08/2012)  . EP Study  2008   Negative EP study in [place]  . ESOPHAGOGASTRODUODENOSCOPY (EGD) WITH PROPOFOL N/A 07/17/2018   Procedure: ESOPHAGOGASTRODUODENOSCOPY (EGD) WITH PROPOFOL;  Surgeon: Rush Landmark Telford Nab., MD;  Location: [place];  Service: Gastroenterology;  Laterality: N/A;  . FRACTURE SURGERY    . LEFT HEART CATHETERIZATION WITH CORONARY ANGIOGRAM N/A 11/10/2012   Procedure: LEFT HEART CATHETERIZATION WITH  CORONARY ANGIOGRAM;  Surgeon: Burnell Blanks, MD;  Location: [facility] CATH LAB;  Service: Cardiovascular;  Laterality: N/A;  . LOOP RECORDER IMPLANT N/A 11/10/2012   Medtronic LinQ implanted by Dr Rayann Heman for afib management  . [place] SURGERY  2000; 2002  . POSTERIOR LUMBAR FUSION  2004  . PROSTATE SURGERY  ~ 2009  . PVI/ CTI ablation  02/25/2009 and 09/2009   S/P afib/ CTI ablation  . SPLENECTOMY, TOTAL N/A 06/07/2019   Procedure: SPLENECTOMY;  Surgeon: Stark Klein, MD;  Location: [place];  Service: General;  Laterality: N/A;  . [place]   "broke in 3 places; had rod put in" (11/08/2012)  . TIBIA HARDWARE REMOVAL Right ~ 1991  . TONSILLECTOMY  1965  . TOTAL HIP ARTHROPLASTY Right 12/18/2014   Procedure: RIGHT TOTAL HIP ARTHROPLASTY ANTERIOR APPROACH;  Surgeon: Gaynelle Arabian, MD;  Location: [facility];  Service: Orthopedics;  Laterality: Right;  . UPPER ESOPHAGEAL ENDOSCOPIC ULTRASOUND (EUS) N/A 07/17/2018   Procedure: UPPER ESOPHAGEAL ENDOSCOPIC ULTRASOUND (EUS);  Surgeon: Rush Landmark,  Telford Nab., MD;  Location: [facility] ENDOSCOPY;  Service: Gastroenterology;  Laterality: N/A;     Prior to Admission medications   Medication Sig Start Date End Date Taking? Authorizing Provider  acetaminophen (TYLENOL) 325 MG tablet Take 650 mg by mouth every 6 (six) hours as needed for headache.    Yes [provider]  cholecalciferol (VITAMIN D3) 25 MCG (1000 UNIT) tablet Take 1,000 Units by mouth daily.   Yes [provider]  Cyanocobalamin (VITAMIN B-12 IJ) Inject 1,000 mcg as directed every 30 (thirty) days.   Yes [provider]  docusate sodium (COLACE) 100 MG capsule Take 100 mg by mouth See admin instructions. Three times a week   Yes [provider]  ELIQUIS 5 MG TABS tablet TAKE 1 TABLET BY MOUTH TWICE A DAY Patient taking differently: Take 5 mg by mouth 2 (two) times daily.  12/25/18  Yes Allred, Jeneen Rinks, MD  escitalopram (LEXAPRO) 10 MG tablet  Take 10 mg by mouth daily. 05/30/17  Yes [provider]  fenofibrate 160 MG tablet Take 160 mg by mouth daily.     Yes [provider]  ferrous sulfate 325 (65 FE) MG tablet Take 325 mg by mouth See admin instructions. Take 1 tablet 2 times a week   Yes [provider]  furosemide (LASIX) 20 MG tablet Take 40 mg by mouth daily.   Yes [provider]  gabapentin (NEURONTIN) 300 MG capsule Take 300 mg by mouth 2 (two) times daily.    Yes [provider]  ibuprofen (ADVIL) 200 MG tablet Take 400 mg by mouth every 6 (six) hours as needed.   Yes [provider]  Lactobacillus (FLORAJEN ACIDOPHILUS PO) Take 1 capsule by mouth daily.   Yes [provider]  Melatonin 10 MG TABS Take 10 mg by mouth at bedtime.    Yes [provider]  metFORMIN (GLUCOPHAGE) 500 MG tablet Take 500 mg by mouth 2 (two) times daily.   Yes [provider]  metoprolol tartrate (LOPRESSOR) 25 MG tablet Take 0.5 tablets (12.5 mg total) by mouth 2 (two) times daily. 06/05/19 09/03/19 Yes Baldwin Jamaica, PA-C  naloxone [facility]) 4 MG/0.1ML LIQD nasal spray kit Place 1 spray into the nose daily as needed (opioid overdose).   Yes [provider]  OVER THE COUNTER MEDICATION Apply 1 application topically daily as needed (rash). OTC fanny cream   Yes [provider]  oxyCODONE-acetaminophen (PERCOCET) 10-325 MG tablet Take 1 tablet by mouth every 4 (four) hours.    Yes [provider]  pantoprazole (PROTONIX) 40 MG tablet TAKE 1 TABLET BY MOUTH TWICE A DAY 04/20/19  Yes Mansouraty, Telford Nab., MD  Tamsulosin HCl (FLOMAX) 0.4 MG CAPS Take 0.4 mg by mouth daily.     Yes [provider]  traZODone (DESYREL) 50 MG tablet Take 100 mg by mouth at bedtime.  08/17/17  Yes [provider]  albuterol (VENTOLIN HFA) 108 (90 Base) MCG/ACT inhaler Inhale 2 puffs into the lungs every 4 (four) hours as needed for wheezing or shortness  of breath.  01/28/19   [provider]  KLOR-CON M10 10 MEQ tablet Take 10 mEq by mouth daily. 04/23/19   [provider]  nitroGLYCERIN (NITROSTAT) 0.4 MG SL tablet Place 1 tablet (0.4 mg total) under the tongue every 5 (five) minutes x 3 doses as needed for chest pain. 12/17/16   Barrett, Evelene Croon, PA-C  ondansetron (ZOFRAN ODT) 4 MG disintegrating tablet Take 1  tablet (4 mg total) by mouth every 6 (six) hours as needed for nausea or vomiting. Place under tongue 03/20/19   Noralyn Pick, NP  promethazine (PHENERGAN) 25 MG tablet Take 1 tablet (25 mg total) by mouth every 8 (eight) hours as needed for nausea or vomiting. 09/25/18   Jackquline Denmark, MD  tiZANidine (ZANAFLEX) 4 MG tablet Take 4 mg by mouth 2 (two) times daily.  03/01/18   [provider]    Inpatient Medications: Scheduled Meds: . sodium chloride   Intravenous Once  . apixaban  5 mg Oral BID  . Chlorhexidine Gluconate Cloth  6 each Topical Daily  . escitalopram  10 mg Oral Daily  . feeding supplement (PRO-STAT SUGAR FREE 64)  30 mL Oral TID  . fenofibrate  160 mg Oral Daily  . gabapentin  300 mg Oral Q12H  . insulin aspart  0-9 Units Subcutaneous TID AC & HS  . mouth rinse  15 mL Mouth Rinse BID  . melatonin  10 mg Oral QHS  . midodrine  10 mg Oral TID WC  . multivitamin with minerals  1 tablet Oral Daily  . pantoprazole  40 mg Oral BID  . potassium chloride  20 mEq Oral BID  . sodium chloride flush  10-40 mL Intracatheter Q12H  . tamsulosin  0.4 mg Oral Daily  . tiZANidine  4 mg Oral BID  . traZODone  100 mg Oral QHS   Continuous Infusions: . sodium chloride Stopped (06/07/19 2338)  . amiodarone 60 mg/hr (06/15/19 1218)   Followed by  . amiodarone    . magnesium sulfate bolus IVPB 4 g (06/15/19 1352)   PRN Meds: sodium chloride, acetaminophen, albuterol, diphenhydrAMINE **OR** diphenhydrAMINE, labetalol, magic mouthwash w/lidocaine, nitroGLYCERIN, oxyCODONE, prochlorperazine **OR**  prochlorperazine, promethazine, sodium chloride flush, traMADol  Allergies:    Allergies  Allergen Reactions  . Penicillins Hives and Shortness Of Breath    Did it involve swelling of the face/tongue/throat, SOB, or low BP? Yes Did it involve sudden or severe rash/hives, skin peeling, or any reaction on the inside of your mouth or nose? No Did you need to seek medical attention at a hospital or doctor's office? Yes (emergency room) When did it last happen?1974 If all above answers are "NO", may proceed with cephalosporin use.      Social History:   Social History   Socioeconomic History  . Marital status: Married    Spouse name: Not on file  . Number of children: 1  . Years of education: Not on file  . Highest education level: Not on file  Occupational History  . Occupation: DISABLED    Employer: DISABLED  . 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 Smoking status: Former Smoker    Packs/day: 3.00    Years: 38.00    Pack years: 114.00    Types: Cigarettes    Quit date: 03/25/2004    Years since quitting: 15.2  . Smokeless tobacco: Former Systems developer    Types: [place] date: 04/25/2004  Substance and Sexual Activity  . Alcohol use: Not Currently    Comment:  "quit drinking in 1989"  . Drug use: No  . Sexual activity: Not Currently  Other Topics Concern  . Not on file  Social History Narrative   Resides in [place] with Steve Anthony wife   Two children and two grandchildren   Attends [employer]   Social Determinants of Health   Financial Resource Strain:   .  Difficulty of Paying Living Expenses:   Food Insecurity:   . Worried About Charity fundraiser in the Last Year:   . Arboriculturist in the Last Year:   Transportation Needs:   . Film/video editor (Medical):   Marland Kitchen Lack of Transportation (Non-Medical):   Physical Activity:   . Days of Exercise per Week:   . Minutes of Exercise per Session:   Stress:   . Feeling of Stress :   Social  Connections:   . Frequency of Communication with Friends and Family:   . Frequency of Social Gatherings with Friends and Family:   . Attends Religious Services:   . Active Member of Clubs or Organizations:   . Attends Archivist Meetings:   Marland Kitchen Marital Status:   Intimate Partner Violence:   . Fear of Current or Ex-Partner:   . Emotionally Abused:   Marland Kitchen Physically Abused:   . Sexually Abused:     Family History:   Family History  Problem Relation Age of Onset  . Emphysema Mother   . Heart attack Father        CVA  . Hypertension Father   . Diabetes Father   . Heart disease Father        MI  . Emphysema Maternal Grandfather        Smoker  . Heart disease Paternal Grandfather   . Colon cancer Neg Hx   . Esophageal cancer Neg Hx   . Inflammatory bowel disease Neg Hx   . Liver disease Neg Hx   . Pancreatic cancer Neg Hx   . Rectal cancer Neg Hx   . Stomach cancer Neg Hx    Family Status:  Family Status  Relation Name Status  . Mother  Alive       19 y/o  . Father  Deceased at age 73        CVA  . Sister  Alive       A&W, 59 y/o  . MGF  (Not Specified)  . PGF  (Not Specified)  . Neg Hx  (Not Specified)    ROS:  Please see the history of present illness.  All other ROS reviewed and negative.     Physical Exam/Data:   Vitals:   06/15/19 1221 06/15/19 1304 06/15/19 1341 06/15/19 1354  BP: 96/70 104/88  107/77  Pulse: (!) 108 99    Resp: _0 Temp:    98.4 F (36.9 C)  TempSrc:    Oral  SpO2: 94% 94%  93%  Weight:      Height:        Intake/Output Summary (Last 24 hours) at 06/15/2019 1552 Last data filed at 06/15/2019 1304 Gross per 24 hour  Intake 240 ml  Output 1060 ml  Net -820 ml   Filed Weights   06/07/19 0552 06/07/19 1500 06/14/19 1858  Weight: 95.7 kg 95 kg 89.3 kg   Body mass index is 26.7 kg/m.   General: Well developed, well nourished, NAD Skin: Warm, dry, intact  Head: Normocephalic, atraumatic, sclera non-icteric, no  xanthomas, clear, moist mucus membranes. Neck: Negative for carotid bruits. No JVD Lungs:Clear to ausculation bilaterally. No wheezes, rales, or rhonchi. Breathing is unlabored. Cardiovascular: RRR with S1 S2. No murmurs, rubs, gallops, or LV heave appreciated. Abdomen: Soft, non-tender, non-distended with normoactive bowel sounds. No hepatomegaly, No rebound/guarding. No obvious abdominal masses. MSK: Strength and tone appear normal for age. 5/5 in all extremities Extremities:  No edema. No clubbing or cyanosis. DP/PT pulses 2+ bilaterally Neuro: Alert and oriented. No focal deficits. No facial asymmetry. MAE spontaneously. Psych: Responds to questions appropriately with normal affect.     EKG:  The EKG was personally reviewed and demonstrates: 06/15/19 atrial fibrillation with RVR, HR 149 bpm with low voltage QRS Telemetry:  Telemetry was personally reviewed and demonstrates:  06/15/19 AF with rates in the 90-100's   Relevant CV Studies:  12/17/2016: TTE Study Conclusions  - Left ventricle: The cavity size was normal. Wall thickness was  increased in a pattern of mild LVH. Systolic function was normal.  The estimated ejection fraction was in the range of 55% to 60%.  Wall motion was normal; there were no regional wall motion  abnormalities. Left ventricular diastolic function parameters  were normal.  - Left atrium: The atrium was mildly dilated.    11/10/2012: LHC Angiographic Findings:  Left main: Aneurysmal segment at distal portion of vessel. Unchanged from last cath. No obstructive disease.   Left Anterior Descending Artery: Large caliber vessel that courses to the apex. There are two small caliber diagonal branches. The proximal segment of the LAD was aneurysmal. The mid LAD just beyond the diagonal branch has a focal 50% stenosis, unchanged from last cath in 2011. The stent in the mid LAD is patent without restenosis. The distal vessel has mild plaque disease.    Circumflex Artery: Large caliber vessel with aneurysmal proximal segment. The first obtuse marginal branch is bifurcating vessel with mild plaque disease. The distal AV groove Circumflex has diffuse 30% stenosis.   Right Coronary Artery: Large dominant vessel with proximal 30% stenosis, serial 20% stenoses in the mid and distal vessel.   Left Ventricular Angiogram: Deferred.   Impression: 1. Stable single vessel CAD with patent stent mid LAD, stable moderate stenosis mid LAD  Recent Labs: Laboratory Data:  Chemistry Recent Labs  Lab 06/13/19 1250 06/14/19 0415 06/15/19 0421  NA 132* 133* 132*  K 4.3 3.9 3.9  CL 96* 98 99  CO2 _0 GLUCOSE 147* 150* 149*  BUN _1 CREATININE 1.10 1.19 1.07  CALCIUM 9.4 9.4 9.0  GFRNONAA >60 >60 >60  GFRAA >60 >60 >60  ANIONGAP _2 Total Protein  Date Value Ref Range Status  06/04/2019 6.2 (L) 6.5 - 8.1 g/dL Final   Albumin  Date Value Ref Range Status  06/04/2019 3.1 (L) 3.5 - 5.0 g/dL Final   AST  Date Value Ref Range Status  06/04/2019 19 15 - 41 U/L Final   ALT  Date Value Ref Range Status  06/04/2019 10 0 - 44 U/L Final   Alkaline Phosphatase  Date Value Ref Range Status  06/04/2019 30 (L) 38 - 126 U/L Final   Total Bilirubin  Date Value Ref Range Status  06/04/2019 0.5 0.3 - 1.2 mg/dL Final   Hematology Recent Labs  Lab 06/13/19 1250 06/14/19 0415 06/15/19 0421  WBC 13.7* 14.6* 11.1*  RBC 3.25* 3.56* 3.63*  HGB 9.1* 10.0* 10.1*  HCT 28.3* 31.3* 32.2*  MCV 87.1 87.9 88.7  MCH 28.0 28.1 27.8  MCHC 32.2 31.9 31.4  RDW 16.0* 15.9* 16.1*  PLT 396 398 481*   Cardiac EnzymesNo results for input(s): TROPONINI in the last 168 hours. No results for input(s): TROPIPOC in the last 168 hours.  BNPNo results for input(s): BNP, PROBNP in the last 168 hours.  DDimer No results for input(s): DDIMER in the last 168  hours. TSH:  Lab Results  Component Value Date   TSH 1.776 06/13/2019    Lipids: Lab Results  Component Value Date   CHOL 113 02/20/2019   HDL 20 (L) 02/20/2019   LDLCALC 38 02/20/2019   TRIG 276 (H) 02/20/2019   CHOLHDL 5.7 02/20/2019   HgbA1c: Lab Results  Component Value Date   HGBA1C 5.5 06/04/2019    Radiology/Studies:  No results found.  Assessment and Plan:   1. Atrial fibrillation with RVR: -Has known history of paroxysmal atrial fibrillation, was last seen in EP follow-up 06/05/2019 for cardiac clearance prior to surgery below.  Patient has been hypotensive for several months therefore Toprol XL was transitioned to metoprolol tartrate 12.5 mg p.o. twice daily for beta-blockade and cardiac protection prior to surgery -Underwent open distal pancreatomy with debridement of pseudopancreatic cyst and splenectomy per general surgery on 06/07/2019 at which time Steve Anthony had complications with hemorrhagic shock in the postoperative setting and was placed on vasopressor support.  Since that time Steve Anthony has stabilized however has had ongoing issues with hypotension. -Unfortunately, patient went into atrial fibrillation with RVR earlier today with rates in the 140 range. -A. fib difficult to manage secondary to hypotension and recent surgery -Per Dr. Marlowe Aschoff note earlier today, may restart Eliquis -Continue Amiodarone gtt for now, then transition to PO at d/c -Would restart PTA metoprolol tartrate 12.49m PO BID with holding parameters (Hold for SBP <85) CHA2DS2VASc =4   2.  Open distal pancreectomy with debridement of pseudopancreatic cyst and splenectomy per general surgery: -Per chart review, has a complicated history involving pancreatitis and pancreatic pseudocysts who has been admitted several times over the past 2 years for similar issues. Pancreatitis initially began in 2019 and has been followed by GI (Dr. GLyndel Safe for cirrhosis and splenomegaly.  Also with a history of infection of the pseudocyst multiple times with last episode 02/2019 at which time  aspiration grew Enterococcus fecalis. Steve Anthony ultimately had a PICC line placement with IV antibiotic therapy, stopped approximately 2 to 3 weeks. -Underwent open distal pancreatectomy with debridement of pseudopancreatic cyst and splenectomy per Dr. BBarry Dienes5/13/2021 -Management per general surgery  3.  Persistent hypotension: -Has had ongoing issues with hypotension, appears to have initially started January/February of this year.  Since Steve Anthony surgery as described above, patient has been placed on midodrine 10 mg p.o. 3 times daily with moderate improvement.  Steve Anthony is asymptomatic with this. -Recent BPs, 107/77, 104/88, 96/70 -On PTA metoprolol tartrate 12.5 mg p.o. twice daily>> currently on hold -Internal medicine service consulted 06/13/2019 for assistance at which time recommendations were for trial of TED hose and abdominal binder along with midodrine as above -BP's have improved and patient reports chronically low BPs for which Steve Anthony monitors at home  4.  CAD s/p prior PCI to mLAD: -No ischemic symptoms -Continue current regimen   For questions or updates, please contact CStrathconaPlease consult [email or web address] for contact info under Cardiology/STEMI.   SLyndel SafeNP-C [facility] Pager: [phone]/21/2021 3:52 PM   Personally seen and examined. Agree with above.   69year old with extensive prior cardiac history, 2 prior atrial fibrillation ablations in 2011 here with pancreatic pseudocyst postoperative.  Developed postoperative atrial fibrillation.  RVR.  Steve Anthony was placed on IV amiodarone.  During Steve Anthony hospitalization, Steve Anthony has had low normal blood pressure.  At times at home, Steve Anthony blood pressure can run in the 915Qsystolic.  Steve Anthony is asymptomatic with this.  Currently Steve Anthony is here with Steve Anthony wife.  Feeling well.  Looking forward to the day Steve Anthony can get out of the hospital.  GEN: Well nourished, well developed, in no acute distress  HEENT: normal  Neck: no JVD, carotid bruits, or  masses Cardiac: Irregularly irregular normal rate; no murmurs, rubs, or gallops,no edema  Respiratory:  clear to auscultation bilaterally, normal work of breathing GI: soft, nontender, nondistended, + BS MS: no deformity or atrophy  Skin: warm and dry, no rash Neuro:  Alert and Oriented x 3, Strength and sensation are intact Psych: euthymic mood, full affect  Labs reviewed. EKG shows A. fib with RVR  Assessment and plan:  A. fib with RVR postoperative -Likely secondary to increased adrenergic tone and predisposition for atrial fibrillation. -Agree with amiodarone IV today, likely convert to p.o. tomorrow with plan to discontinue amiodarone after 3 to 4 weeks if Steve Anthony is maintaining sinus rhythm. -Metoprolol tartrate 12.5 mg twice a day is reasonable.  I would only hold this medication if Steve Anthony systolics are less than 90.  This should not affect Steve Anthony blood pressure significantly.  CAD -Prior PCI to mid LAD no chest pain.  No anginal symptoms.  Debridement of pancreatic pseudocyst and splenectomy -Dr. [name].  Notes reviewed.  Candee Furbish, MD

## 2019-06-15 NOTE — Progress Notes (Signed)
   06/15/19 0840  Assess: MEWS Score  Temp 98.9 F (37.2 C)  BP 91/68  Pulse Rate (!) 148  Resp 16  Level of Consciousness Alert  SpO2 94 %  O2 Device Room Air  Assess: MEWS Score  MEWS Temp 0  MEWS Systolic 1  MEWS Pulse 3  MEWS RR 1  MEWS LOC 0  MEWS Score 5  MEWS Score Color Red  Assess: if the MEWS score is Yellow or Red  Were vital signs taken at a resting state? Yes  Focused Assessment Documented focused assessment  Early Detection of Sepsis Score *See Row Information* Medium  MEWS guidelines implemented *See Row Information* No, previously red, continue vital signs every 4 hours  Treat  MEWS Interventions Escalated (See documentation below);Other (Comment) (Contacted Dr. [name])  Take Vital Signs  Increase Vital Sign Frequency  Red: Q 1hr X 4 then Q 4hr X 4, if remains red, continue Q 4hrs  Escalate  MEWS: Escalate Red: discuss with charge nurse/RN and provider, consider discussing with RRT  Notify: Charge Nurse/RN  Name of Charge Nurse/RN Notified Ruby Cola  Date Charge Nurse/RN Notified 06/15/19  Time Charge Nurse/RN Notified 0840  Notify: Provider  Provider Name/Title Dr. [name]  Date Provider Notified 06/15/19  Time Provider Notified [phone]  Notification Type Call  Notification Reason Other (Comment) (HR sustained in 130-150)  Response See new orders  Date of Provider Response 06/15/19  Time of Provider Response 0841    D. Byerly notified and ordered 12 lead EKG, and blood cultures STAT.   Dr. Wyline Copas also notified.

## 2019-06-15 NOTE — Progress Notes (Signed)
PT Cancellation Note  Patient Details Name: Steve Anthony MRN: [id number] DOB: May 25, 1950   Cancelled Treatment:    Reason Eval/Treat Not Completed: Medical issues which prohibited therapy Pt with afib, transferred to stepdown unit. Pending cardiology consult and CT abdomen. Will continue efforts.    Wyona Almas, PT, DPT Acute Rehabilitation Services Pager [phone] Office [phone]    Deno Etienne 06/15/2019, 2:54 PM

## 2019-06-16 LAB — URINALYSIS, ROUTINE W REFLEX MICROSCOPIC
Bilirubin Urine: NEGATIVE
Glucose, UA: NEGATIVE mg/dL
Hgb urine dipstick: NEGATIVE
Ketones, ur: NEGATIVE mg/dL
Nitrite: NEGATIVE
Protein, ur: NEGATIVE mg/dL
Specific Gravity, Urine: 1.023 (ref 1.005–1.030)
WBC, UA: >50 WBC/hpf — ABNORMAL HIGH (ref 0–5)
pH: 5 (ref 5.0–8.0)

## 2019-06-16 LAB — CBC
HCT: 30.4 % — ABNORMAL LOW (ref 39.0–52.0)
Hemoglobin: 9.5 g/dL — ABNORMAL LOW (ref 13.0–17.0)
MCH: 27.5 pg (ref 26.0–34.0)
MCHC: 31.3 g/dL (ref 30.0–36.0)
MCV: 88.1 fL (ref 80.0–100.0)
Platelets: 240 10*3/uL (ref 150–400)
RBC: 3.45 MIL/uL — ABNORMAL LOW (ref 4.22–5.81)
RDW: 16.2 % — ABNORMAL HIGH (ref 11.5–15.5)
WBC: 12.5 10*3/uL — ABNORMAL HIGH (ref 4.0–10.5)
nRBC: 0 % (ref 0.0–0.2)

## 2019-06-16 LAB — BASIC METABOLIC PANEL
Anion gap: 10 (ref 5–15)
BUN: 11 mg/dL (ref 8–23)
CO2: 23 mmol/L (ref 22–32)
Calcium: 9 mg/dL (ref 8.9–10.3)
Chloride: 98 mmol/L (ref 98–111)
Creatinine, Ser: 1.06 mg/dL (ref 0.61–1.24)
GFR calc Af Amer: >60 mL/min (ref >60)
GFR calc non Af Amer: >60 mL/min (ref >60)
Glucose, Bld: 140 mg/dL — ABNORMAL HIGH (ref 70–99)
Potassium: 4.3 mmol/L (ref 3.5–5.1)
Sodium: 131 mmol/L — ABNORMAL LOW (ref 135–145)

## 2019-06-16 LAB — MAGNESIUM: Magnesium: 1.9 mg/dL (ref 1.7–2.4)

## 2019-06-16 LAB — GLUCOSE, CAPILLARY
Glucose-Capillary: 131 mg/dL — ABNORMAL HIGH (ref 70–99)
Glucose-Capillary: 142 mg/dL — ABNORMAL HIGH (ref 70–99)
Glucose-Capillary: 126 mg/dL — ABNORMAL HIGH (ref 70–99)
Glucose-Capillary: 130 mg/dL — ABNORMAL HIGH (ref 70–99)

## 2019-06-16 MED ORDER — AMIODARONE HCL 200 MG PO TABS
200.0000 mg | ORAL_TABLET | Freq: Every day | ORAL | Status: DC
  Administered 2019-06-16: 200 mg via ORAL
  Filled 2019-06-16: qty 1

## 2019-06-16 MED ORDER — METRONIDAZOLE IN NACL 5-0.79 MG/ML-% IV SOLN
500.0000 mg | Freq: Four times a day (QID) | INTRAVENOUS | Status: DC
  Administered 2019-06-16 – 2019-06-18 (×8): 500 mg via INTRAVENOUS
  Filled 2019-06-16 (×9): qty 100

## 2019-06-16 MED ORDER — GABAPENTIN 300 MG PO CAPS
300.0000 mg | ORAL_CAPSULE | Freq: Three times a day (TID) | ORAL | Status: DC
  Administered 2019-06-16 – 2019-06-18 (×6): 300 mg via ORAL
  Filled 2019-06-16 (×6): qty 1

## 2019-06-16 MED ORDER — AMIODARONE HCL 200 MG PO TABS
200.0000 mg | ORAL_TABLET | Freq: Two times a day (BID) | ORAL | Status: DC
  Administered 2019-06-16 – 2019-06-18 (×4): 200 mg via ORAL
  Filled 2019-06-16 (×5): qty 1

## 2019-06-16 MED ORDER — MAGNESIUM OXIDE 400 (241.3 MG) MG PO TABS
400.0000 mg | ORAL_TABLET | Freq: Two times a day (BID) | ORAL | Status: AC
  Administered 2019-06-16 (×2): 400 mg via ORAL
  Filled 2019-06-16 (×2): qty 1

## 2019-06-16 MED ORDER — CIPROFLOXACIN IN D5W 400 MG/200ML IV SOLN
400.0000 mg | Freq: Two times a day (BID) | INTRAVENOUS | Status: DC
  Administered 2019-06-16 – 2019-06-18 (×5): 400 mg via INTRAVENOUS
  Filled 2019-06-16 (×6): qty 200

## 2019-06-16 NOTE — Progress Notes (Signed)
9 Days Post-Op   Subjective/Chief Complaint:  NAEO. Pain controlled, having some back pain which is chronic. Reports some mild dysuria. +flatus. Last BM was Thursday.   Wife at bedside. We discussed CT scan results. Objective: Vital signs in last 24 hours: Temp:  [97.4 F (36.3 C)-98.9 F (37.2 C)] 98.2 F (36.8 C) (05/22 0340) Pulse Rate:  [60-148] 60 (05/22 0340) Resp:  [12-20] 17 (05/22 0532) BP: (71-107)/(51-88) 107/75 (05/22 0532) SpO2:  [92 %-95 %] 95 % (05/22 0532) Last BM Date: 06/14/19  Intake/Output from previous day: 05/21 0701 - 05/22 0700 In: 611.2 [I.V.:611.2] Out: 900 [Urine:900] Intake/Output this shift: No intake/output data recorded.  Gen: comfortable, no distress Neuro: non-focal exam HEENT: PERRL Neck: supple CV: RRR Pulm: unlabored breathing Abd: abd binder in place soft, appropriately TTP, incision c/d/i with staples.  No drainage or evidence of wound infection. Extr: wwp, trace edema  Lab Results:  Recent Labs    06/15/19 0421 06/16/19 0420  WBC 11.1* 12.5*  HGB 10.1* 9.5*  HCT 32.2* 30.4*  PLT 481* 240   BMET Recent Labs    06/15/19 0421 06/16/19 0420  NA 132* 131*  K 3.9 4.3  CL 99 98  CO2 24 23  GLUCOSE 149* 140*  BUN 13 11  CREATININE 1.07 1.06  CALCIUM 9.0 9.0   Studies/Results: CT ABDOMEN PELVIS W CONTRAST  Result Date: 06/15/2019 CLINICAL DATA:  Status post distal pancreatectomy, splenectomy and debridement of pancreatic necrosis on 06/07/2019. Question abscess. EXAM: CT ABDOMEN AND PELVIS WITH CONTRAST TECHNIQUE: Multidetector CT imaging of the abdomen and pelvis was performed using the standard protocol following bolus administration of intravenous contrast. CONTRAST:  148m OMNIPAQUE IOHEXOL 300 MG/ML  SOLN COMPARISON:  03/29/2019 FINDINGS: Lower chest: Dependent atelectasis in the lung bases. Hepatobiliary: No suspicious focal abnormality within the liver parenchyma. Gallbladder surgically absent. No intrahepatic or  extrahepatic biliary dilation. Pancreas: Status post distal pancreatectomy. Large complex fluid collection seen on the previous study is no longer evident. 9.6 x 1.8 x 1.9 cm tubular shaped rim enhancing fluid collection is identified in the pancreatectomy bed. Spleen: Splenectomy with edema and small (1-2 cm) scattered unorganized fluid collections in the nephrectomy bed. Adrenals/Urinary Tract: No adrenal nodule or mass. Stable bilateral renal cysts. Tiny hypoattenuating lesions in both kidneys identified, too small to characterize but likely benign. No evidence for hydroureter. The urinary bladder appears normal for the degree of distention. Stomach/Bowel: Stomach is nondistended. Posterior fundal diverticulum evident. Duodenal diverticulum again noted. No small bowel wall thickening. No small bowel dilatation. The terminal ileum is normal. The appendix is normal. Right and transverse colon distended with gas and contrast material. Left colon is nondistended with left colonic diverticulosis evident. Vascular/Lymphatic: There is abdominal aortic atherosclerosis without aneurysm. There is no gastrohepatic or hepatoduodenal ligament lymphadenopathy. No retroperitoneal or mesenteric lymphadenopathy. No pelvic sidewall lymphadenopathy. Reproductive: The prostate gland and seminal vesicles are unremarkable. Other: There is trace intraperitoneal free air under the anterior abdominal wall, not unexpected on postoperative day 8. No substantial intraperitoneal free fluid. There is some trace fluid in the left paracolic gutter. Small 4.2 x 2.0 cm fluid collection in the gastrocolic ligament shows no rim enhancement (29/9). Trace fluid noted at the inferior tip of the right liver. Musculoskeletal: No worrisome lytic or sclerotic osseous abnormality. Status post right hip replacement. Lumbar fusion hardware noted. IMPRESSION: 1. Status post distal pancreatectomy and splenectomy. 9.6 x 1.8 x 1.9 cm tubular shaped rim enhancing  fluid collection is identified in the  pancreatectomy bed. This has attenuation higher than would be expected for serous fluid and may represent a hematoma. Although no gas is seen in the collection, given the rim enhancement, superinfection cannot be excluded. 2. Scattered small fluid collections in the gastrocolic ligament and splenectomy bed show no organization/rim enhancement. 3. Trace intraperitoneal free gas, not unexpected on postoperative day 8. There is trace fluid in the peritoneal cavity. 4. Distention of the ascending and transverse colon. Left colon is nondistended but is not decompressed. There is no associated small bowel dilatation. Component of ileus is a consideration. 5. Bilateral renal cysts. 6. Left colonic diverticulosis without diverticulitis. 7. Aortic Atherosclerosis (ICD10-I70.0). Electronically Signed   By: Misty Stanley M.D.   On: 06/15/2019 16:22    Anti-infectives: Anti-infectives (From admission, onward)   Start     Dose/Rate Route Frequency Ordered Stop   06/07/19 1400  ciprofloxacin (CIPRO) IVPB 400 mg     400 mg 200 mL/hr over 60 Minutes Intravenous Every 12 hours 06/07/19 1356 06/07/19 1649   06/07/19 0600  ciprofloxacin (CIPRO) IVPB 400 mg     400 mg 200 mL/hr over 60 Minutes Intravenous On call to O.R. 06/07/19 0551 06/07/19 0806   06/07/19 0558  ciprofloxacin (CIPRO) 400 MG/200ML IVPB    Note to Pharmacy: Marga Melnick   : cabinet override      06/07/19 0558 06/07/19 0834      Assessment/Plan: s/p Procedure(s): OPEN DISTAL PANCREATECTOMY, DEBRIEDMENTOF PSEUDO PANCREATIC CYST (N/A) SPLENECTOMY (N/A)  Afib- now rate controlled on amio gtt, metoprolol BID (hold SBP<85); appreciate cards following ABL anemia - stable.   Thrombocytopenia - resolved.  Hemorrhagic shock - resolved.   Leukocytosis - WBC 12.5 from 11, monitor. CT with 9.6x1.8x1.9 cm fluid collection - not sure this would be amenable to drainage. No pneumonia from what I can see on CT A/P. Will  check UA given dysuria. FEN -diet as tolerated.  Calorie count in progress. Hyponatremia (131) repeat BMP in AM, consider salt tabs. Replete Mag for goal > 2.0 DM - SSI. VTE: SCDs, Eliquis resumed 5/21  PT/OT on board, continue   Dispo: unsure of discharge date, at least 10 hours. HR and hypotension need to be controlled on PO meds and need to rule out/control intra-abdominal infection.    LOS: 9 days    Jill Alexanders MD  06/16/2019

## 2019-06-16 NOTE — Progress Notes (Signed)
PROGRESS NOTE    Steve Anthony  KGM:[id number] DOB: 08-16-1950 DOA: 06/07/2019 PCP: [name], MD    Brief Narrative:  69 y.o. male with medical history significant of hypertension, dyslipidemia, CAD s/p stent, paroxysmal atrial fibrillation on Eliquis, COPD, cryptogenic cirrhosis([place]), and recurrent pancreatitis with pseudocyst who presented for treatment of a recurrent infected pancreatic pseudocyst.  Patient underwent open distal pancreatectomy, splenectomy, and debridement of pancreatic necrosis on 5/13 by Dr. [name].  Following the procedure patient was noted to be in hemorrhagic shock which he was able to be weaned off neo.  Patient has continued to have intermittent low blood pressures despite receiving blood transfusions, IV fluids, albumin, and being started on midodrine.  Denies having any chest pain, palpitations, vomiting, or diarrhea.  Patient had actually been constipated up until this morning when he had a bowel movement.  After leaving the bathroom patient felt lightheaded as though he could pass out but never lost consciousness.  At baseline he reports having issues with his blood pressures being up and down, but never had to be on medication to keep his blood pressures up.  Assessment & Plan:   Active Problems:   Pancreatic pseudocyst   Infected pancreatic pseudocyst   Protein-calorie malnutrition, severe   Hypotension  1. Recurrent pancreatitis with pancreatic pseudocyst: s/p open distal pancreatectomy, debridement of pseudopancreatic cyst, and splenectomy on 5/13 by Dr. [name].  -    Cont management per General Surgery - Abd CT performed with results reviewed, finding of fluid collection, possibility for hematoma but cannot rule out infection - Empiric abx started by general surgery  2. Recurrent hypotension: Acute. Found to have intermittent episodes of hypotension despite receiving blood, albumin, IV fluids, and midodrine. Patient does not appear to have adrenal  insufficiency based off random cortisol level.  He does not want to be placed on any additional medications. -Pt is continued on midodrine 10 mg 3 times daily -Pt was given trial of TED hose and abdominal binder  -BP seems to fluctuate from normotension and hypotension with sbp to the 70's. Pt remains asymptomatic during these episodes -Chart reviewed, and seems to be an on-going issues, known in the outpatient setting. Of note, one one prior Cardiology visit, pt was advised to briefly hold lasix given hypotension in the office with sbp in the 70's - BP thus far today stable  3. Acute blood loss anemia: Hemoglobin prior to procedure was around 10, but had dropped to a low of 7.6.  Patient transfused 1 unit of packed red blood cells on 5/19. -Post-op hgb appropriately corrected and remains stable currently  4. Afib RVR -New finding recently. Pt had been previously rate controlled -I had ordered amio gtt yesterday and seems to have controlled pt's rate -Appreciate Cardiology input. Plan to transition to PO amio, cont beta blocker, only hold if sbp<90  5. Hypomagnesemia - Replaced -continue to follow lytes and correct as needed  DVT prophylaxis: SCD's Code Status: Full Family Communication: Pt in room, family at bedside  Antimicrobials: Anti-infectives (From admission, onward)   Start     Dose/Rate Route Frequency Ordered Stop   06/16/19 1200  metroNIDAZOLE (FLAGYL) IVPB 500 mg     500 mg 100 mL/hr over 60 Minutes Intravenous Every 6 hours 06/16/19 0959     06/16/19 1100  ciprofloxacin (CIPRO) IVPB 400 mg     400 mg 200 mL/hr over 60 Minutes Intravenous 2 times daily 06/16/19 0959     06/07/19 1400  ciprofloxacin (CIPRO)  IVPB 400 mg     400 mg 200 mL/hr over 60 Minutes Intravenous Every 12 hours 06/07/19 1356 06/07/19 1649   06/07/19 0600  ciprofloxacin (CIPRO) IVPB 400 mg     400 mg 200 mL/hr over 60 Minutes Intravenous On call to O.R. 06/07/19 7654 06/07/19 0806   06/07/19 0558   ciprofloxacin (CIPRO) 400 MG/200ML IVPB    Note to Pharmacy: Marga Melnick   : cabinet override      06/07/19 0558 06/07/19 0834      Subjective: Without complaints this AM  Objective: Vitals:   06/16/19 0420 06/16/19 0532 06/16/19 0728 06/16/19 1146  BP: 97/62 107/75 100/79 123/86  Pulse:   68 (!) 59  Resp: 15 17 19 19   Temp:   98.1 F (36.7 C) 98.4 F (36.9 C)  TempSrc:   Oral Oral  SpO2:  95% 95% 95%  Weight:      Height:        Intake/Output Summary (Last 24 hours) at 06/16/2019 1427 Last data filed at 06/16/2019 6503 Gross per 24 hour  Intake 731.17 ml  Output 700 ml  Net 31.17 ml   Filed Weights   06/07/19 0552 06/07/19 1500 06/14/19 1858  Weight: 95.7 kg 95 kg 89.3 kg    Examination: General exam: Conversant, in no acute distress Respiratory system: normal chest rise, clear, no audible wheezing Cardiovascular system: regular rhythm, s1-s2 Gastrointestinal system: Nondistended, nontender, pos BS Central nervous system: No seizures, no tremors Extremities: No cyanosis, no joint deformities Skin: No rashes, no pallor Psychiatry: Affect normal // no auditory hallucinations   Data Reviewed: I have personally reviewed following labs and imaging studies  CBC: Recent Labs  Lab 06/12/19 0457 06/13/19 1250 06/14/19 0415 06/15/19 0421 06/16/19 0420  WBC 7.4 13.7* 14.6* 11.1* 12.5*  HGB 8.3* 9.1* 10.0* 10.1* 9.5*  HCT 26.5* 28.3* 31.3* 32.2* 30.4*  MCV 88.9 87.1 87.9 88.7 88.1  PLT 316 396 398 481* 546   Basic Metabolic Panel: Recent Labs  Lab 06/09/19 1849 06/09/19 1849 06/10/19 0408 06/10/19 0408 06/11/19 0455 06/11/19 0455 06/12/19 0457 06/13/19 1250 06/14/19 0415 06/15/19 0421 06/15/19 1011 06/16/19 0420  NA 131*   < > 133*   < > 134*   < > 134* 132* 133* 132*  --  131*  K 4.1   < > 3.9   < > 3.9   < > 3.4* 4.3 3.9 3.9  --  4.3  CL 99   < > 102   < > 99   < > 98 96* 98 99  --  98  CO2 25   < > 24   < > 24   < > 26 25 25 24   --  23  GLUCOSE  183*   < > 127*   < > 131*   < > 148* 147* 150* 149*  --  140*  BUN 6*   < > 5*   < > 7*   < > 6* 8 10 13   --  11  CREATININE 0.99   < > 1.04   < > 0.98   < > 0.95 1.10 1.19 1.07  --  1.06  CALCIUM 8.3*   < > 8.5*   < > 8.8*   < > 9.1 9.4 9.4 9.0  --  9.0  MG 1.8   < > 1.6*   < > 1.7  --  1.3* 1.5*  --   --  1.4* 1.9  PHOS 4.0  --  3.4  --  3.8  --  3.2  --   --   --   --   --    < > = values in this interval not displayed.   GFR: Estimated Creatinine Clearance: 74.2 mL/min (by C-G formula based on SCr of 1.06 mg/dL). Liver Function Tests: No results for input(s): AST, ALT, ALKPHOS, BILITOT, PROT, ALBUMIN in the last 168 hours. No results for input(s): LIPASE, AMYLASE in the last 168 hours. No results for input(s): AMMONIA in the last 168 hours. Coagulation Profile: No results for input(s): INR, PROTIME in the last 168 hours. Cardiac Enzymes: No results for input(s): CKTOTAL, CKMB, CKMBINDEX, TROPONINI in the last 168 hours. BNP (last 3 results) No results for input(s): PROBNP in the last 8760 hours. HbA1C: No results for input(s): HGBA1C in the last 72 hours. CBG: Recent Labs  Lab 06/15/19 0754 06/15/19 1144 06/15/19 1635 06/16/19 0726 06/16/19 1145  GLUCAP 151* 155* 123* 131* 142*   Lipid Profile: No results for input(s): CHOL, HDL, LDLCALC, TRIG, CHOLHDL, LDLDIRECT in the last 72 hours. Thyroid Function Tests: No results for input(s): TSH, T4TOTAL, FREET4, T3FREE, THYROIDAB in the last 72 hours. Anemia Panel: No results for input(s): VITAMINB12, FOLATE, FERRITIN, TIBC, IRON, RETICCTPCT in the last 72 hours. Sepsis Labs: No results for input(s): PROCALCITON, LATICACIDVEN in the last 168 hours.  Recent Results (from the past 240 hour(s))  MRSA PCR Screening     Status: None   Collection Time: 06/07/19  6:00 PM   Specimen: Nasal Mucosa; Nasopharyngeal  Result Value Ref Range Status   MRSA by PCR NEGATIVE NEGATIVE Final    Comment:        The GeneXpert MRSA Assay (FDA  approved for NASAL specimens only), is one component of a comprehensive MRSA colonization surveillance program. It is not intended to diagnose MRSA infection nor to guide or monitor treatment for MRSA infections. Performed at [facility], [place] [address], [place], [place] [address]   Culture, blood (single)     Status: None (Preliminary result)   Collection Time: 06/15/19 10:12 AM   Specimen: BLOOD  Result Value Ref Range Status   Specimen Description BLOOD LEFT ANTECUBITAL  Final   Special Requests   Final    BOTTLES DRAWN AEROBIC ONLY Blood Culture adequate volume   Culture   Final    NO GROWTH < 24 HOURS Performed at [facility], [place] [address] [place], [place] [address]    Report Status PENDING  Incomplete     Radiology Studies: CT ABDOMEN PELVIS W CONTRAST  Result Date: 06/15/2019 CLINICAL DATA:  Status post distal pancreatectomy, splenectomy and debridement of pancreatic necrosis on 06/07/2019. Question abscess. EXAM: CT ABDOMEN AND PELVIS WITH CONTRAST TECHNIQUE: Multidetector CT imaging of the abdomen and pelvis was performed using the standard protocol following bolus administration of intravenous contrast. CONTRAST:  120m OMNIPAQUE IOHEXOL 300 MG/ML  SOLN COMPARISON:  03/29/2019 FINDINGS: Lower chest: Dependent atelectasis in the lung bases. Hepatobiliary: No suspicious focal abnormality within the liver parenchyma. Gallbladder surgically absent. No intrahepatic or extrahepatic biliary dilation. Pancreas: Status post distal pancreatectomy. Large complex fluid collection seen on the previous study is no longer evident. 9.6 x 1.8 x 1.9 cm tubular shaped rim enhancing fluid collection is identified in the pancreatectomy bed. Spleen: Splenectomy with edema and small (1-2 cm) scattered unorganized fluid collections in the nephrectomy bed. Adrenals/Urinary Tract: No adrenal nodule or mass. Stable bilateral renal cysts. Tiny hypoattenuating lesions in both kidneys  identified, too small to characterize but likely benign. No evidence for hydroureter. The urinary bladder appears normal for the degree of distention. Stomach/Bowel: Stomach is nondistended. Posterior fundal diverticulum evident. Duodenal diverticulum again noted. No small bowel wall thickening. No small bowel dilatation. The terminal ileum is normal. The appendix is normal. Right and transverse colon distended with gas and contrast material. Left colon is nondistended with left colonic diverticulosis evident. Vascular/Lymphatic: There is abdominal aortic atherosclerosis without aneurysm. There is no gastrohepatic or hepatoduodenal ligament lymphadenopathy. No retroperitoneal or mesenteric lymphadenopathy. No pelvic sidewall lymphadenopathy. Reproductive: The prostate gland and seminal vesicles are unremarkable. Other: There is trace intraperitoneal free air under the anterior abdominal wall, not unexpected on postoperative day 8. No substantial intraperitoneal free fluid. There is some trace fluid in the left paracolic gutter. Small 4.2 x 2.0 cm fluid collection in the gastrocolic ligament shows no rim enhancement (29/9). Trace fluid noted at the inferior tip of the right liver. Musculoskeletal: No worrisome lytic or sclerotic osseous abnormality. Status post right hip replacement. Lumbar fusion hardware noted. IMPRESSION: 1. Status post distal pancreatectomy and splenectomy. 9.6 x 1.8 x 1.9 cm tubular shaped rim enhancing fluid collection is identified in the pancreatectomy bed. This has attenuation higher than would be expected for serous fluid and may represent a hematoma. Although no gas is seen in the collection, given the rim enhancement, superinfection cannot be excluded. 2. Scattered small fluid collections in the gastrocolic ligament and splenectomy bed show no organization/rim enhancement. 3. Trace intraperitoneal free gas, not unexpected on postoperative day 8. There is trace fluid in the peritoneal  cavity. 4. Distention of the ascending and transverse colon. Left colon is nondistended but is not decompressed. There is no associated small bowel dilatation. Component of ileus is a consideration. 5. Bilateral renal cysts. 6. Left colonic diverticulosis without diverticulitis. 7. Aortic Atherosclerosis (ICD10-I70.0). Electronically Signed   By: Misty Stanley M.D.   On: 06/15/2019 16:22    Scheduled Meds: . sodium chloride   Intravenous Once  . amiodarone  200 mg Oral BID  . apixaban  5 mg Oral BID  . Chlorhexidine Gluconate Cloth  6 each Topical Daily  . escitalopram  10 mg Oral Daily  . feeding supplement (PRO-STAT SUGAR FREE 64)  30 mL Oral TID  . fenofibrate  160 mg Oral Daily  . gabapentin  300 mg Oral TID  . insulin aspart  0-9 Units Subcutaneous TID AC & HS  . magnesium oxide  400 mg Oral BID  . mouth rinse  15 mL Mouth Rinse BID  . melatonin  10 mg Oral QHS  . metoprolol tartrate  12.5 mg Oral BID  . midodrine  10 mg Oral TID WC  . multivitamin with minerals  1 tablet Oral Daily  . pantoprazole  40 mg Oral BID  . potassium chloride  20 mEq Oral BID  . sodium chloride flush  10-40 mL Intracatheter Q12H  . tamsulosin  0.4 mg Oral Daily  . traZODone  100 mg Oral QHS   Continuous Infusions: . sodium chloride Stopped (06/07/19 2338)  . ciprofloxacin 400 mg (06/16/19 1116)  . metronidazole 500 mg (06/16/19 1229)     LOS: 9 days   Marylu Lund, MD [facility] Pager On Amion  If 7PM-7AM, please contact night-coverage 06/16/2019, 2:27 PM

## 2019-06-16 NOTE — Progress Notes (Signed)
Progress Note  Patient Name: Steve Anthony Date of Encounter: 06/16/2019  Primary Cardiologist: Thompson Grayer, MD   Subjective   The patient had another episode of A. fib with RVR this morning when he went to the bathroom and it was draining, however only lasted hour and a half and he cardioverted spontaneously to sinus rhythm, he felt palpitations but no shortness of breath or dizziness.  Inpatient Medications    Scheduled Meds: . sodium chloride   Intravenous Once  . amiodarone  200 mg Oral Daily  . apixaban  5 mg Oral BID  . Chlorhexidine Gluconate Cloth  6 each Topical Daily  . escitalopram  10 mg Oral Daily  . feeding supplement (PRO-STAT SUGAR FREE 64)  30 mL Oral TID  . fenofibrate  160 mg Oral Daily  . gabapentin  300 mg Oral TID  . insulin aspart  0-9 Units Subcutaneous TID AC & HS  . magnesium oxide  400 mg Oral BID  . mouth rinse  15 mL Mouth Rinse BID  . melatonin  10 mg Oral QHS  . metoprolol tartrate  12.5 mg Oral BID  . midodrine  10 mg Oral TID WC  . multivitamin with minerals  1 tablet Oral Daily  . pantoprazole  40 mg Oral BID  . potassium chloride  20 mEq Oral BID  . sodium chloride flush  10-40 mL Intracatheter Q12H  . tamsulosin  0.4 mg Oral Daily  . tiZANidine  4 mg Oral BID  . traZODone  100 mg Oral QHS   Continuous Infusions: . sodium chloride Stopped (06/07/19 2338)  . ciprofloxacin 400 mg (06/16/19 1116)  . metronidazole     PRN Meds: sodium chloride, acetaminophen, albuterol, diphenhydrAMINE **OR** diphenhydrAMINE, labetalol, magic mouthwash w/lidocaine, nitroGLYCERIN, oxyCODONE, prochlorperazine **OR** prochlorperazine, promethazine, sodium chloride flush, traMADol   Vital Signs    Vitals:   06/16/19 0340 06/16/19 0420 06/16/19 0532 06/16/19 0728  BP: (!) 73/51 97/62 107/75 100/79  Pulse: 60   68  Resp: 20 15 17 19   Temp: 98.2 F (36.8 C)   98.1 F (36.7 C)  TempSrc:    Oral  SpO2: 95%  95% 95%  Weight:      Height:         Intake/Output Summary (Last 24 hours) at 06/16/2019 1117 Last data filed at 06/16/2019 0829 Gross per 24 hour  Intake 731.17 ml  Output 1050 ml  Net -318.83 ml   Last 3 Weights 06/14/2019 06/07/2019 06/07/2019  Weight (lbs) 196 lb 13.9 oz 209 lb 7 oz 211 lb  Weight (kg) 89.3 kg 95 kg 95.709 kg      Telemetry    Sinus rhythm with ventricular rates in 60s, one episode of A. fib from 8 to 9:30 AM, ventricular rates 140 bpm- Personally Reviewed  ECG    No new tracing- Personally Reviewed  Physical Exam   GEN: No acute distress.   Neck: No JVD Cardiac: RRR, no murmurs, rubs, or gallops.  Respiratory: Clear to auscultation bilaterally. GI: Soft, nontender, non-distended  MS: No edema; No deformity. Neuro:  Nonfocal  Psych: Normal affect   Labs    High Sensitivity Troponin:  No results for input(s): TROPONINIHS in the last 720 hours.    Chemistry Recent Labs  Lab 06/14/19 0415 06/15/19 0421 06/16/19 0420  NA 133* 132* 131*  K 3.9 3.9 4.3  CL 98 99 98  CO2 25 24 23   GLUCOSE 150* 149* 140*  BUN 10 13 11  CREATININE 1.19 1.07 1.06  CALCIUM 9.4 9.0 9.0  GFRNONAA >60 >60 >60  GFRAA >60 >60 >60  ANIONGAP 10 9 10      Hematology Recent Labs  Lab 06/14/19 0415 06/15/19 0421 06/16/19 0420  WBC 14.6* 11.1* 12.5*  RBC 3.56* 3.63* 3.45*  HGB 10.0* 10.1* 9.5*  HCT 31.3* 32.2* 30.4*  MCV 87.9 88.7 88.1  MCH 28.1 27.8 27.5  MCHC 31.9 31.4 31.3  RDW 15.9* 16.1* 16.2*  PLT 398 481* 240    BNPNo results for input(s): BNP, PROBNP in the last 168 hours.   DDimer No results for input(s): DDIMER in the last 168 hours.   Radiology    CT ABDOMEN PELVIS W CONTRAST  Result Date: 06/15/2019 CLINICAL DATA:  Status post distal pancreatectomy, splenectomy and debridement of pancreatic necrosis on 06/07/2019. Question abscess. EXAM: CT ABDOMEN AND PELVIS WITH CONTRAST TECHNIQUE: Multidetector CT imaging of the abdomen and pelvis was performed using the standard protocol  following bolus administration of intravenous contrast. CONTRAST:  179m OMNIPAQUE IOHEXOL 300 MG/ML  SOLN COMPARISON:  03/29/2019 FINDINGS: Lower chest: Dependent atelectasis in the lung bases. Hepatobiliary: No suspicious focal abnormality within the liver parenchyma. Gallbladder surgically absent. No intrahepatic or extrahepatic biliary dilation. Pancreas: Status post distal pancreatectomy. Large complex fluid collection seen on the previous study is no longer evident. 9.6 x 1.8 x 1.9 cm tubular shaped rim enhancing fluid collection is identified in the pancreatectomy bed. Spleen: Splenectomy with edema and small (1-2 cm) scattered unorganized fluid collections in the nephrectomy bed. Adrenals/Urinary Tract: No adrenal nodule or mass. Stable bilateral renal cysts. Tiny hypoattenuating lesions in both kidneys identified, too small to characterize but likely benign. No evidence for hydroureter. The urinary bladder appears normal for the degree of distention. Stomach/Bowel: Stomach is nondistended. Posterior fundal diverticulum evident. Duodenal diverticulum again noted. No small bowel wall thickening. No small bowel dilatation. The terminal ileum is normal. The appendix is normal. Right and transverse colon distended with gas and contrast material. Left colon is nondistended with left colonic diverticulosis evident. Vascular/Lymphatic: There is abdominal aortic atherosclerosis without aneurysm. There is no gastrohepatic or hepatoduodenal ligament lymphadenopathy. No retroperitoneal or mesenteric lymphadenopathy. No pelvic sidewall lymphadenopathy. Reproductive: The prostate gland and seminal vesicles are unremarkable. Other: There is trace intraperitoneal free air under the anterior abdominal wall, not unexpected on postoperative day 8. No substantial intraperitoneal free fluid. There is some trace fluid in the left paracolic gutter. Small 4.2 x 2.0 cm fluid collection in the gastrocolic ligament shows no rim  enhancement (29/9). Trace fluid noted at the inferior tip of the right liver. Musculoskeletal: No worrisome lytic or sclerotic osseous abnormality. Status post right hip replacement. Lumbar fusion hardware noted. IMPRESSION: 1. Status post distal pancreatectomy and splenectomy. 9.6 x 1.8 x 1.9 cm tubular shaped rim enhancing fluid collection is identified in the pancreatectomy bed. This has attenuation higher than would be expected for serous fluid and may represent a hematoma. Although no gas is seen in the collection, given the rim enhancement, superinfection cannot be excluded. 2. Scattered small fluid collections in the gastrocolic ligament and splenectomy bed show no organization/rim enhancement. 3. Trace intraperitoneal free gas, not unexpected on postoperative day 8. There is trace fluid in the peritoneal cavity. 4. Distention of the ascending and transverse colon. Left colon is nondistended but is not decompressed. There is no associated small bowel dilatation. Component of ileus is a consideration. 5. Bilateral renal cysts. 6. Left colonic diverticulosis without diverticulitis. 7. Aortic Atherosclerosis (ICD10-I70.0). Electronically  Signed   By: Misty Stanley M.D.   On: 06/15/2019 16:22    Cardiac Studies     Patient Profile     69 y.o. male   Assessment & Plan    A. fib with RVR postoperative -Likely secondary to increased adrenergic tone and predisposition for atrial fibrillation. -He was given IV amiodarone yesterday this was discontinued, currently on amiodarone 200 mg daily, I will increase to p.o. twice daily for next 5 days and then go back to 200 mg daily. -Ideally will discontinue at the outpatient follow-up if he remains in sinus rhythm.  -Metoprolol tartrate 12.5 mg twice a day is reasonable.  I would only hold this medication if his systolics are less than 90.  This should not affect his blood pressure significantly.  CAD -Prior PCI to mid LAD no chest pain.  No anginal  symptoms.  Debridement of pancreatic pseudocyst and splenectomy -Dr. [name].  Notes reviewed.  For questions or updates, please contact [place] Please consult [email or web address] for contact info under        Signed, Ena Dawley, MD  06/16/2019, 11:17 AM

## 2019-06-17 DIAGNOSIS — I4891 Unspecified atrial fibrillation: Secondary | ICD-10-CM

## 2019-06-17 LAB — CBC
HCT: 29.1 % — ABNORMAL LOW (ref 39.0–52.0)
Hemoglobin: 9.2 g/dL — ABNORMAL LOW (ref 13.0–17.0)
MCH: 28.1 pg (ref 26.0–34.0)
MCHC: 31.6 g/dL (ref 30.0–36.0)
MCV: 89 fL (ref 80.0–100.0)
Platelets: 374 10*3/uL (ref 150–400)
RBC: 3.27 MIL/uL — ABNORMAL LOW (ref 4.22–5.81)
RDW: 16.2 % — ABNORMAL HIGH (ref 11.5–15.5)
WBC: 13.9 10*3/uL — ABNORMAL HIGH (ref 4.0–10.5)
nRBC: 0.1 % (ref 0.0–0.2)

## 2019-06-17 LAB — BASIC METABOLIC PANEL
Anion gap: 7 (ref 5–15)
BUN: 10 mg/dL (ref 8–23)
CO2: 24 mmol/L (ref 22–32)
Calcium: 8.8 mg/dL — ABNORMAL LOW (ref 8.9–10.3)
Chloride: 104 mmol/L (ref 98–111)
Creatinine, Ser: 1.09 mg/dL (ref 0.61–1.24)
GFR calc Af Amer: >60 mL/min (ref >60)
GFR calc non Af Amer: >60 mL/min (ref >60)
Glucose, Bld: 127 mg/dL — ABNORMAL HIGH (ref 70–99)
Potassium: 4.2 mmol/L (ref 3.5–5.1)
Sodium: 135 mmol/L (ref 135–145)

## 2019-06-17 LAB — GLUCOSE, CAPILLARY
Glucose-Capillary: 157 mg/dL — ABNORMAL HIGH (ref 70–99)
Glucose-Capillary: 148 mg/dL — ABNORMAL HIGH (ref 70–99)
Glucose-Capillary: 163 mg/dL — ABNORMAL HIGH (ref 70–99)
Glucose-Capillary: 249 mg/dL — ABNORMAL HIGH (ref 70–99)

## 2019-06-17 LAB — URINE CULTURE: Culture: <10000 — AB

## 2019-06-17 LAB — MAGNESIUM: Magnesium: 1.6 mg/dL — ABNORMAL LOW (ref 1.7–2.4)

## 2019-06-17 MED ORDER — MAGNESIUM SULFATE 4 GM/100ML IV SOLN
4.0000 g | Freq: Once | INTRAVENOUS | Status: AC
  Administered 2019-06-17: 4 g via INTRAVENOUS
  Filled 2019-06-17: qty 100

## 2019-06-17 NOTE — Progress Notes (Addendum)
10 Days Post-Op   Subjective/Chief Complaint:  Patent attorney for discharge. Abdominal pain controlled. Tolerating PO without N/V. Reports eating a bologna sandwich for breakfast. +flatus and non-bloody BM yesterday. Mild dysuria when starting to void.   Card transitioned to PO amiodarone yesterday.  Objective: Vital signs in last 24 hours: Temp:  [97.8 F (36.6 C)-99.3 F (37.4 C)] 97.8 F (36.6 C) (05/23 0759) Pulse Rate:  [59-75] 75 (05/22 1944) Resp:  [12-19] 15 (05/23 0759) BP: (89-123)/(56-86) 93/60 (05/23 0759) SpO2:  [95 %-97 %] 97 % (05/22 1944) Last BM Date: 06/16/19  Intake/Output from previous day: 05/22 0701 - 05/23 0700 In: 820 [P.O.:120; IV Piggyback:700] Out: 1650 [Urine:1650] Intake/Output this shift: No intake/output data recorded.  Gen: comfortable, no distress Neuro: non-focal exam HEENT: PERRL Neck: supple CV: RRR Pulm: unlabored breathing Abd: abd binder in place soft, appropriately TTP, incision c/d/i with staples mild erythema at site of staple insertion.  No drainage or evidence of wound infection. Extr: wwp, trace edema  Lab Results:  Recent Labs    06/16/19 0420 06/17/19 0325  WBC 12.5* 13.9*  HGB 9.5* 9.2*  HCT 30.4* 29.1*  PLT 240 374   BMET Recent Labs    06/16/19 0420 06/17/19 0325  NA 131* 135  K 4.3 4.2  CL 98 104  CO2 23 24  GLUCOSE 140* 127*  BUN 11 10  CREATININE 1.06 1.09  CALCIUM 9.0 8.8*   Studies/Results: CT ABDOMEN PELVIS W CONTRAST  Result Date: 06/15/2019 CLINICAL DATA:  Status post distal pancreatectomy, splenectomy and debridement of pancreatic necrosis on 06/07/2019. Question abscess. EXAM: CT ABDOMEN AND PELVIS WITH CONTRAST TECHNIQUE: Multidetector CT imaging of the abdomen and pelvis was performed using the standard protocol following bolus administration of intravenous contrast. CONTRAST:  127m OMNIPAQUE IOHEXOL 300 MG/ML  SOLN COMPARISON:  03/29/2019 FINDINGS: Lower chest: Dependent atelectasis in the lung bases.  Hepatobiliary: No suspicious focal abnormality within the liver parenchyma. Gallbladder surgically absent. No intrahepatic or extrahepatic biliary dilation. Pancreas: Status post distal pancreatectomy. Large complex fluid collection seen on the previous study is no longer evident. 9.6 x 1.8 x 1.9 cm tubular shaped rim enhancing fluid collection is identified in the pancreatectomy bed. Spleen: Splenectomy with edema and small (1-2 cm) scattered unorganized fluid collections in the nephrectomy bed. Adrenals/Urinary Tract: No adrenal nodule or mass. Stable bilateral renal cysts. Tiny hypoattenuating lesions in both kidneys identified, too small to characterize but likely benign. No evidence for hydroureter. The urinary bladder appears normal for the degree of distention. Stomach/Bowel: Stomach is nondistended. Posterior fundal diverticulum evident. Duodenal diverticulum again noted. No small bowel wall thickening. No small bowel dilatation. The terminal ileum is normal. The appendix is normal. Right and transverse colon distended with gas and contrast material. Left colon is nondistended with left colonic diverticulosis evident. Vascular/Lymphatic: There is abdominal aortic atherosclerosis without aneurysm. There is no gastrohepatic or hepatoduodenal ligament lymphadenopathy. No retroperitoneal or mesenteric lymphadenopathy. No pelvic sidewall lymphadenopathy. Reproductive: The prostate gland and seminal vesicles are unremarkable. Other: There is trace intraperitoneal free air under the anterior abdominal wall, not unexpected on postoperative day 8. No substantial intraperitoneal free fluid. There is some trace fluid in the left paracolic gutter. Small 4.2 x 2.0 cm fluid collection in the gastrocolic ligament shows no rim enhancement (29/9). Trace fluid noted at the inferior tip of the right liver. Musculoskeletal: No worrisome lytic or sclerotic osseous abnormality. Status post right hip replacement. Lumbar fusion  hardware noted. IMPRESSION: 1. Status post distal pancreatectomy and  splenectomy. 9.6 x 1.8 x 1.9 cm tubular shaped rim enhancing fluid collection is identified in the pancreatectomy bed. This has attenuation higher than would be expected for serous fluid and may represent a hematoma. Although no gas is seen in the collection, given the rim enhancement, superinfection cannot be excluded. 2. Scattered small fluid collections in the gastrocolic ligament and splenectomy bed show no organization/rim enhancement. 3. Trace intraperitoneal free gas, not unexpected on postoperative day 8. There is trace fluid in the peritoneal cavity. 4. Distention of the ascending and transverse colon. Left colon is nondistended but is not decompressed. There is no associated small bowel dilatation. Component of ileus is a consideration. 5. Bilateral renal cysts. 6. Left colonic diverticulosis without diverticulitis. 7. Aortic Atherosclerosis (ICD10-I70.0). Electronically Signed   By: Misty Stanley M.D.   On: 06/15/2019 16:22   Anti-infectives: Anti-infectives (From admission, onward)   Start     Dose/Rate Route Frequency Ordered Stop   06/16/19 1200  metroNIDAZOLE (FLAGYL) IVPB 500 mg     500 mg 100 mL/hr over 60 Minutes Intravenous Every 6 hours 06/16/19 0959     06/16/19 1100  ciprofloxacin (CIPRO) IVPB 400 mg     400 mg 200 mL/hr over 60 Minutes Intravenous 2 times daily 06/16/19 0959     06/07/19 1400  ciprofloxacin (CIPRO) IVPB 400 mg     400 mg 200 mL/hr over 60 Minutes Intravenous Every 12 hours 06/07/19 1356 06/07/19 1649   06/07/19 0600  ciprofloxacin (CIPRO) IVPB 400 mg     400 mg 200 mL/hr over 60 Minutes Intravenous On call to O.R. 06/07/19 8144 06/07/19 0806   06/07/19 0558  ciprofloxacin (CIPRO) 400 MG/200ML IVPB    Note to Pharmacy: Marga Melnick   : cabinet override      06/07/19 0558 06/07/19 0834     Assessment/Plan: s/p Procedure(s): OPEN DISTAL PANCREATECTOMY, DEBRIEDMENTOF PSEUDO PANCREATIC  CYST (N/A) SPLENECTOMY (N/A)  Afib- now rate controlled; amiodarone 100 mg daily, metoprolol BID (hold SBP<85); appreciate cards following. OP follow up.  ABL anemia - stable.   Thrombocytopenia - resolved.  Hemorrhagic shock - resolved.   Leukocytosis - WBC 13.9 from 12.5, monitor. CT with 9.6x1.8x1.9 cm fluid collection - not sure this would be amenable to drainage. No pneumonia from what I can see on CT A/P. UA with large leuks, Cx pending. FEN -diet as tolerated.  Calorie count in progress. Hyponatremia (131) repeat BMP in AM, consider salt tabs. Replete Mg (1.6) for goal > 2.0 ID - cipro, flagyl 5/22 >>, follow Urine Cx, tizanidine temporarily held due to interaction with cipro DM - SSI. VTE: SCDs, Eliquis resumed 5/21  PT/OT on board, continue   Dispo: continue IV abx and observation, unsure of discharge date at this time. At least 24 hours  Low suspicion for a line-associated bacteremia but pt does have 2 single-lumen PICCs placed in 02/2019 so would consider BCx/line exchange for fever and worsening leukocytosis.   LOS: 10 days    Jill Alexanders MD  06/17/2019

## 2019-06-17 NOTE — Progress Notes (Signed)
Progress Note  Patient Name: Steve Anthony Date of Encounter: 06/17/2019  Primary Cardiologist: Thompson Grayer, MD   Subjective   The patient remains in [place].  Inpatient Medications    Scheduled Meds: . sodium chloride   Intravenous Once  . amiodarone  200 mg Oral BID  . apixaban  5 mg Oral BID  . Chlorhexidine Gluconate Cloth  6 each Topical Daily  . escitalopram  10 mg Oral Daily  . feeding supplement (PRO-STAT SUGAR FREE 64)  30 mL Oral TID  . fenofibrate  160 mg Oral Daily  . gabapentin  300 mg Oral TID  . insulin aspart  0-9 Units Subcutaneous TID AC & HS  . mouth rinse  15 mL Mouth Rinse BID  . melatonin  10 mg Oral QHS  . metoprolol tartrate  12.5 mg Oral BID  . midodrine  10 mg Oral TID WC  . multivitamin with minerals  1 tablet Oral Daily  . pantoprazole  40 mg Oral BID  . potassium chloride  20 mEq Oral BID  . sodium chloride flush  10-40 mL Intracatheter Q12H  . tamsulosin  0.4 mg Oral Daily  . traZODone  100 mg Oral QHS   Continuous Infusions: . sodium chloride Stopped (06/07/19 2338)  . ciprofloxacin 400 mg (06/17/19 0912)  . magnesium sulfate bolus IVPB 4 g (06/17/19 1038)  . metronidazole 500 mg (06/17/19 0652)   PRN Meds: sodium chloride, acetaminophen, albuterol, diphenhydrAMINE **OR** diphenhydrAMINE, labetalol, magic mouthwash w/lidocaine, nitroGLYCERIN, oxyCODONE, prochlorperazine **OR** prochlorperazine, promethazine, sodium chloride flush, traMADol   Vital Signs    Vitals:   06/16/19 1634 06/16/19 1944 06/17/19 0005 06/17/19 0759  BP: 110/76 107/69 (!) 89/56 93/60  Pulse: 66 75    Resp: 12 15 17 15   Temp: 99.3 F (37.4 C) 98.1 F (36.7 C) 98.6 F (37 C) 97.8 F (36.6 C)  TempSrc: Oral Oral  Oral  SpO2: 95% 97%    Weight:      Height:        Intake/Output Summary (Last 24 hours) at 06/17/2019 1122 Last data filed at 06/17/2019 0913 Gross per 24 hour  Intake 700 ml  Output 1700 ml  Net -1000 ml   Last 3 Weights 06/14/2019 06/07/2019  06/07/2019  Weight (lbs) 196 lb 13.9 oz 209 lb 7 oz 211 lb  Weight (kg) 89.3 kg 95 kg 95.709 kg      Telemetry    Sinus rhythm with ventricular rates in 60s, one episode of A. fib from 8 to 9:30 AM, ventricular rates 140 bpm- Personally Reviewed  ECG    No new tracing- Personally Reviewed  Physical Exam   GEN: No acute distress.   Neck: No JVD Cardiac: RRR, no murmurs, rubs, or gallops.  Respiratory: Clear to auscultation bilaterally. GI: Soft, nontender, non-distended  MS: No edema; No deformity. Neuro:  Nonfocal  Psych: Normal affect   Labs    High Sensitivity Troponin:  No results for input(s): TROPONINIHS in the last 720 hours.    Chemistry Recent Labs  Lab 06/15/19 0421 06/16/19 0420 06/17/19 0325  NA 132* 131* 135  K 3.9 4.3 4.2  CL 99 98 104  CO2 24 23 24   GLUCOSE 149* 140* 127*  BUN 13 11 10   CREATININE 1.07 1.06 1.09  CALCIUM 9.0 9.0 8.8*  GFRNONAA >60 >60 >60  GFRAA >60 >60 >60  ANIONGAP 9 10 7      Hematology Recent Labs  Lab 06/15/19 0421 06/16/19 0420 06/17/19 0325  WBC 11.1* 12.5* 13.9*  RBC 3.63* 3.45* 3.27*  HGB 10.1* 9.5* 9.2*  HCT 32.2* 30.4* 29.1*  MCV 88.7 88.1 89.0  MCH 27.8 27.5 28.1  MCHC 31.4 31.3 31.6  RDW 16.1* 16.2* 16.2*  PLT 481* 240 374    BNPNo results for input(s): BNP, PROBNP in the last 168 hours.   DDimer No results for input(s): DDIMER in the last 168 hours.   Radiology    CT ABDOMEN PELVIS W CONTRAST  Result Date: 06/15/2019 CLINICAL DATA:  Status post distal pancreatectomy, splenectomy and debridement of pancreatic necrosis on 06/07/2019. Question abscess. EXAM: CT ABDOMEN AND PELVIS WITH CONTRAST TECHNIQUE: Multidetector CT imaging of the abdomen and pelvis was performed using the standard protocol following bolus administration of intravenous contrast. CONTRAST:  187m OMNIPAQUE IOHEXOL 300 MG/ML  SOLN COMPARISON:  03/29/2019 FINDINGS: Lower chest: Dependent atelectasis in the lung bases. Hepatobiliary: No  suspicious focal abnormality within the liver parenchyma. Gallbladder surgically absent. No intrahepatic or extrahepatic biliary dilation. Pancreas: Status post distal pancreatectomy. Large complex fluid collection seen on the previous study is no longer evident. 9.6 x 1.8 x 1.9 cm tubular shaped rim enhancing fluid collection is identified in the pancreatectomy bed. Spleen: Splenectomy with edema and small (1-2 cm) scattered unorganized fluid collections in the nephrectomy bed. Adrenals/Urinary Tract: No adrenal nodule or mass. Stable bilateral renal cysts. Tiny hypoattenuating lesions in both kidneys identified, too small to characterize but likely benign. No evidence for hydroureter. The urinary bladder appears normal for the degree of distention. Stomach/Bowel: Stomach is nondistended. Posterior fundal diverticulum evident. Duodenal diverticulum again noted. No small bowel wall thickening. No small bowel dilatation. The terminal ileum is normal. The appendix is normal. Right and transverse colon distended with gas and contrast material. Left colon is nondistended with left colonic diverticulosis evident. Vascular/Lymphatic: There is abdominal aortic atherosclerosis without aneurysm. There is no gastrohepatic or hepatoduodenal ligament lymphadenopathy. No retroperitoneal or mesenteric lymphadenopathy. No pelvic sidewall lymphadenopathy. Reproductive: The prostate gland and seminal vesicles are unremarkable. Other: There is trace intraperitoneal free air under the anterior abdominal wall, not unexpected on postoperative day 8. No substantial intraperitoneal free fluid. There is some trace fluid in the left paracolic gutter. Small 4.2 x 2.0 cm fluid collection in the gastrocolic ligament shows no rim enhancement (29/9). Trace fluid noted at the inferior tip of the right liver. Musculoskeletal: No worrisome lytic or sclerotic osseous abnormality. Status post right hip replacement. Lumbar fusion hardware noted.  IMPRESSION: 1. Status post distal pancreatectomy and splenectomy. 9.6 x 1.8 x 1.9 cm tubular shaped rim enhancing fluid collection is identified in the pancreatectomy bed. This has attenuation higher than would be expected for serous fluid and may represent a hematoma. Although no gas is seen in the collection, given the rim enhancement, superinfection cannot be excluded. 2. Scattered small fluid collections in the gastrocolic ligament and splenectomy bed show no organization/rim enhancement. 3. Trace intraperitoneal free gas, not unexpected on postoperative day 8. There is trace fluid in the peritoneal cavity. 4. Distention of the ascending and transverse colon. Left colon is nondistended but is not decompressed. There is no associated small bowel dilatation. Component of ileus is a consideration. 5. Bilateral renal cysts. 6. Left colonic diverticulosis without diverticulitis. 7. Aortic Atherosclerosis (ICD10-I70.0). Electronically Signed   By: EMisty StanleyM.D.   On: 06/15/2019 16:22    Cardiac Studies     Patient Profile     69y.o. male   Assessment & Plan    A.  fib with RVR postoperative -Likely secondary to increased adrenergic tone and predisposition for atrial fibrillation. -He was given IV amiodarone on 5/21 this was discontinued, currently on amiodarone 200 mg daily, I increased to p.o. twice daily yesterday for total of 5 days and then go back to 200 mg daily. -Ideally will discontinue at the outpatient follow-up if he remains in sinus rhythm.  -Metoprolol tartrate 12.5 mg twice a day is reasonable.  I would only hold this medication if his systolics are less than 90.  This should not affect his blood pressure significantly.  CAD -Prior PCI to mid LAD no chest pain.  No anginal symptoms.  Debridement of pancreatic pseudocyst and splenectomy -Dr. [name].  Notes reviewed.  For questions or updates, please contact [place] Please consult [email or web address] for contact info under         Signed, Ena Dawley, MD  06/17/2019, 11:22 AM

## 2019-06-17 NOTE — Progress Notes (Signed)
PROGRESS NOTE    Steve Anthony  BWG:[id number] DOB: 15-Aug-1950 DOA: 06/07/2019 PCP: [name], MD    Brief Narrative:  69 y.o. male with medical history significant of hypertension, dyslipidemia, CAD s/p stent, paroxysmal atrial fibrillation on Eliquis, COPD, cryptogenic cirrhosis([place]), and recurrent pancreatitis with pseudocyst who presented for treatment of a recurrent infected pancreatic pseudocyst.  Patient underwent open distal pancreatectomy, splenectomy, and debridement of pancreatic necrosis on 5/13 by Dr. [name].  Following the procedure patient was noted to be in hemorrhagic shock which he was able to be weaned off neo.  Patient has continued to have intermittent low blood pressures despite receiving blood transfusions, IV fluids, albumin, and being started on midodrine.  Denies having any chest pain, palpitations, vomiting, or diarrhea.  Patient had actually been constipated up until this morning when he had a bowel movement.  After leaving the bathroom patient felt lightheaded as though he could pass out but never lost consciousness.  At baseline he reports having issues with his blood pressures being up and down, but never had to be on medication to keep his blood pressures up.  Assessment & Plan:   Active Problems:   Pancreatic pseudocyst   Infected pancreatic pseudocyst   Protein-calorie malnutrition, severe   Hypotension  1. Recurrent pancreatitis with pancreatic pseudocyst: s/p open distal pancreatectomy, debridement of pseudopancreatic cyst, and splenectomy on 5/13 by Dr. [name].  -    Cont post-op management per General Surgery  2. Recurrent hypotension: Acute. Found to have intermittent episodes of hypotension despite receiving blood, albumin, IV fluids, and midodrine. Patient does not appear to have adrenal insufficiency based off random cortisol level.  He does not want to be placed on any additional medications. -Pt is continued on midodrine 10 mg 3 times  daily -Pt was given trial of TED hose and abdominal binder  -BP seems to fluctuate from normotension and hypotension with sbp to the 70's. Pt remains asymptomatic during these episodes -Chart reviewed, and seems to be an on-going issues, well-known in the outpatient setting. Of note, one one prior Cardiology visit, pt was advised to briefly hold lasix given hypotension in the office with sbp in the 70's -Given chronicity of bp as well as with pt being asymptomatic, would be more conservative and monitor for now  3. Acute blood loss anemia: Hemoglobin prior to procedure was around 10, but had dropped to a low of 7.6.  Patient transfused 1 unit of packed red blood cells on 5/19. -Post-op hgb appropriately corrected at 10 with hgb remaining stable  4. Afib RVR -Recently noted to have afib RVR, pt is s/p prior cardioversions by EP -Rate now controlled with amiodarone and low dose metoprolol resumed -Cardiology is also following. Recommendation to gradually wean off amiodarone over time, 260m po bid x 5 days, then 2010mpo daily with eventual discontinuation of amio as outpatient -Recommendation to cont metoprolol tartrate 12.80m39mhen able to as long as sbp >90  5. Hypomagnesemia - Replaced -repeat lytes in am and correct as needed  DVT prophylaxis: SCD's Code Status: Full Family Communication: Pt in room, family not at bedside  Antimicrobials: Anti-infectives (From admission, onward)   Start     Dose/Rate Route Frequency Ordered Stop   06/16/19 1200  metroNIDAZOLE (FLAGYL) IVPB 500 mg     500 mg 100 mL/hr over 60 Minutes Intravenous Every 6 hours 06/16/19 0959     06/16/19 1100  ciprofloxacin (CIPRO) IVPB 400 mg     400 mg 200  mL/hr over 60 Minutes Intravenous 2 times daily 06/16/19 0959     06/07/19 1400  ciprofloxacin (CIPRO) IVPB 400 mg     400 mg 200 mL/hr over 60 Minutes Intravenous Every 12 hours 06/07/19 1356 06/07/19 1649   06/07/19 0600  ciprofloxacin (CIPRO) IVPB 400 mg      400 mg 200 mL/hr over 60 Minutes Intravenous On call to O.R. 06/07/19 9150 06/07/19 0806   06/07/19 0558  ciprofloxacin (CIPRO) 400 MG/200ML IVPB    Note to Pharmacy: Marga Melnick   : cabinet override      06/07/19 0558 06/07/19 0834      Subjective: Without complaints this AM  Objective: Vitals:   06/16/19 1944 06/17/19 0005 06/17/19 0759 06/17/19 1150  BP: 107/69 (!) 89/56 93/60 (!) 84/57  Pulse: 75   72  Resp: 15 17 15 19   Temp: 98.1 F (36.7 C) 98.6 F (37 C) 97.8 F (36.6 C) 98.1 F (36.7 C)  TempSrc: Oral  Oral Oral  SpO2: 97%   96%  Weight:      Height:        Intake/Output Summary (Last 24 hours) at 06/17/2019 1433 Last data filed at 06/17/2019 0913 Gross per 24 hour  Intake 700 ml  Output 1700 ml  Net -1000 ml   Filed Weights   06/07/19 0552 06/07/19 1500 06/14/19 1858  Weight: 95.7 kg 95 kg 89.3 kg    Examination: General exam: Conversant, in no acute distress Respiratory system: normal chest rise, clear, no audible wheezing Cardiovascular system: regular rhythm, s1-s2 Gastrointestinal system: Nondistended, nontender, pos BS Central nervous system: No seizures, no tremors Extremities: No cyanosis, no joint deformities Skin: No rashes, no pallor Psychiatry: Affect normal // no auditory hallucinations   Data Reviewed: I have personally reviewed following labs and imaging studies  CBC: Recent Labs  Lab 06/13/19 1250 06/14/19 0415 06/15/19 0421 06/16/19 0420 06/17/19 0325  WBC 13.7* 14.6* 11.1* 12.5* 13.9*  HGB 9.1* 10.0* 10.1* 9.5* 9.2*  HCT 28.3* 31.3* 32.2* 30.4* 29.1*  MCV 87.1 87.9 88.7 88.1 89.0  PLT 396 398 481* 240 569   Basic Metabolic Panel: Recent Labs  Lab 06/11/19 0455 06/11/19 0455 06/12/19 0457 06/12/19 0457 06/13/19 1250 06/14/19 0415 06/15/19 0421 06/15/19 1011 06/16/19 0420 06/17/19 0325  NA 134*   < > 134*   < > 132* 133* 132*  --  131* 135  K 3.9   < > 3.4*   < > 4.3 3.9 3.9  --  4.3 4.2  CL 99   < > 98   < >  96* 98 99  --  98 104  CO2 24   < > 26   < > 25 25 24   --  23 24  GLUCOSE 131*   < > 148*   < > 147* 150* 149*  --  140* 127*  BUN 7*   < > 6*   < > 8 10 13   --  11 10  CREATININE 0.98   < > 0.95   < > 1.10 1.19 1.07  --  1.06 1.09  CALCIUM 8.8*   < > 9.1   < > 9.4 9.4 9.0  --  9.0 8.8*  MG 1.7   < > 1.3*  --  1.5*  --   --  1.4* 1.9 1.6*  PHOS 3.8  --  3.2  --   --   --   --   --   --   --    < > =  values in this interval not displayed.   GFR: Estimated Creatinine Clearance: 72.2 mL/min (by C-G formula based on SCr of 1.09 mg/dL). Liver Function Tests: No results for input(s): AST, ALT, ALKPHOS, BILITOT, PROT, ALBUMIN in the last 168 hours. No results for input(s): LIPASE, AMYLASE in the last 168 hours. No results for input(s): AMMONIA in the last 168 hours. Coagulation Profile: No results for input(s): INR, PROTIME in the last 168 hours. Cardiac Enzymes: No results for input(s): CKTOTAL, CKMB, CKMBINDEX, TROPONINI in the last 168 hours. BNP (last 3 results) No results for input(s): PROBNP in the last 8760 hours. HbA1C: No results for input(s): HGBA1C in the last 72 hours. CBG: Recent Labs  Lab 06/16/19 0726 06/16/19 1145 06/16/19 1638 06/16/19 2124 06/17/19 1148  GLUCAP 131* 142* 126* 130* 157*   Lipid Profile: No results for input(s): CHOL, HDL, LDLCALC, TRIG, CHOLHDL, LDLDIRECT in the last 72 hours. Thyroid Function Tests: No results for input(s): TSH, T4TOTAL, FREET4, T3FREE, THYROIDAB in the last 72 hours. Anemia Panel: No results for input(s): VITAMINB12, FOLATE, FERRITIN, TIBC, IRON, RETICCTPCT in the last 72 hours. Sepsis Labs: No results for input(s): PROCALCITON, LATICACIDVEN in the last 168 hours.  Recent Results (from the past 240 hour(s))  MRSA PCR Screening     Status: None   Collection Time: 06/07/19  6:00 PM   Specimen: Nasal Mucosa; Nasopharyngeal  Result Value Ref Range Status   MRSA by PCR NEGATIVE NEGATIVE Final    Comment:        The GeneXpert  MRSA Assay (FDA approved for NASAL specimens only), is one component of a comprehensive MRSA colonization surveillance program. It is not intended to diagnose MRSA infection nor to guide or monitor treatment for MRSA infections. Performed at [facility], [place] [address], [place], [place] [address]   Culture, blood (single)     Status: None (Preliminary result)   Collection Time: 06/15/19 10:12 AM   Specimen: BLOOD  Result Value Ref Range Status   Specimen Description BLOOD LEFT ANTECUBITAL  Final   Special Requests   Final    BOTTLES DRAWN AEROBIC ONLY Blood Culture adequate volume   Culture   Final    NO GROWTH 2 DAYS Performed at [facility], [place] [address] [place], [place] [address]    Report Status PENDING  Incomplete  Urine culture     Status: Abnormal   Collection Time: 06/16/19  5:37 PM   Specimen: Urine, Clean Catch  Result Value Ref Range Status   Specimen Description URINE, CLEAN CATCH  Final   Special Requests NONE  Final   Culture (A)  Final    <10,000 COLONIES/mL INSIGNIFICANT GROWTH Performed at [facility], [place] [address] [place],  [address]    Report Status 06/17/2019 FINAL  Final     Radiology Studies: CT ABDOMEN PELVIS W CONTRAST  Result Date: 06/15/2019 CLINICAL DATA:  Status post distal pancreatectomy, splenectomy and debridement of pancreatic necrosis on 06/07/2019. Question abscess. EXAM: CT ABDOMEN AND PELVIS WITH CONTRAST TECHNIQUE: Multidetector CT imaging of the abdomen and pelvis was performed using the standard protocol following bolus administration of intravenous contrast. CONTRAST:  140m OMNIPAQUE IOHEXOL 300 MG/ML  SOLN COMPARISON:  03/29/2019 FINDINGS: Lower chest: Dependent atelectasis in the lung bases. Hepatobiliary: No suspicious focal abnormality within the liver parenchyma. Gallbladder surgically absent. No intrahepatic or extrahepatic biliary dilation. Pancreas: Status post distal pancreatectomy. Large  complex fluid collection seen on the previous study is no longer evident.  9.6 x 1.8 x 1.9 cm tubular shaped rim enhancing fluid collection is identified in the pancreatectomy bed. Spleen: Splenectomy with edema and small (1-2 cm) scattered unorganized fluid collections in the nephrectomy bed. Adrenals/Urinary Tract: No adrenal nodule or mass. Stable bilateral renal cysts. Tiny hypoattenuating lesions in both kidneys identified, too small to characterize but likely benign. No evidence for hydroureter. The urinary bladder appears normal for the degree of distention. Stomach/Bowel: Stomach is nondistended. Posterior fundal diverticulum evident. Duodenal diverticulum again noted. No small bowel wall thickening. No small bowel dilatation. The terminal ileum is normal. The appendix is normal. Right and transverse colon distended with gas and contrast material. Left colon is nondistended with left colonic diverticulosis evident. Vascular/Lymphatic: There is abdominal aortic atherosclerosis without aneurysm. There is no gastrohepatic or hepatoduodenal ligament lymphadenopathy. No retroperitoneal or mesenteric lymphadenopathy. No pelvic sidewall lymphadenopathy. Reproductive: The prostate gland and seminal vesicles are unremarkable. Other: There is trace intraperitoneal free air under the anterior abdominal wall, not unexpected on postoperative day 8. No substantial intraperitoneal free fluid. There is some trace fluid in the left paracolic gutter. Small 4.2 x 2.0 cm fluid collection in the gastrocolic ligament shows no rim enhancement (29/9). Trace fluid noted at the inferior tip of the right liver. Musculoskeletal: No worrisome lytic or sclerotic osseous abnormality. Status post right hip replacement. Lumbar fusion hardware noted. IMPRESSION: 1. Status post distal pancreatectomy and splenectomy. 9.6 x 1.8 x 1.9 cm tubular shaped rim enhancing fluid collection is identified in the pancreatectomy bed. This has attenuation  higher than would be expected for serous fluid and may represent a hematoma. Although no gas is seen in the collection, given the rim enhancement, superinfection cannot be excluded. 2. Scattered small fluid collections in the gastrocolic ligament and splenectomy bed show no organization/rim enhancement. 3. Trace intraperitoneal free gas, not unexpected on postoperative day 8. There is trace fluid in the peritoneal cavity. 4. Distention of the ascending and transverse colon. Left colon is nondistended but is not decompressed. There is no associated small bowel dilatation. Component of ileus is a consideration. 5. Bilateral renal cysts. 6. Left colonic diverticulosis without diverticulitis. 7. Aortic Atherosclerosis (ICD10-I70.0). Electronically Signed   By: Misty Stanley M.D.   On: 06/15/2019 16:22    Scheduled Meds: . sodium chloride   Intravenous Once  . amiodarone  200 mg Oral BID  . apixaban  5 mg Oral BID  . Chlorhexidine Gluconate Cloth  6 each Topical Daily  . escitalopram  10 mg Oral Daily  . feeding supplement (PRO-STAT SUGAR FREE 64)  30 mL Oral TID  . fenofibrate  160 mg Oral Daily  . gabapentin  300 mg Oral TID  . insulin aspart  0-9 Units Subcutaneous TID AC & HS  . mouth rinse  15 mL Mouth Rinse BID  . melatonin  10 mg Oral QHS  . metoprolol tartrate  12.5 mg Oral BID  . midodrine  10 mg Oral TID WC  . multivitamin with minerals  1 tablet Oral Daily  . pantoprazole  40 mg Oral BID  . potassium chloride  20 mEq Oral BID  . sodium chloride flush  10-40 mL Intracatheter Q12H  . tamsulosin  0.4 mg Oral Daily  . traZODone  100 mg Oral QHS   Continuous Infusions: . sodium chloride Stopped (06/07/19 2338)  . ciprofloxacin 400 mg (06/17/19 0912)  . metronidazole 500 mg (06/17/19 1240)     LOS: 10 days   Marylu Lund, MD [facility] Pager On  Amion  If 7PM-7AM, please contact night-coverage 06/17/2019, 2:33 PM

## 2019-06-18 DIAGNOSIS — I950 Idiopathic hypotension: Secondary | ICD-10-CM

## 2019-06-18 LAB — CBC
HCT: 30.5 % — ABNORMAL LOW (ref 39.0–52.0)
Hemoglobin: 9.3 g/dL — ABNORMAL LOW (ref 13.0–17.0)
MCH: 27.6 pg (ref 26.0–34.0)
MCHC: 30.5 g/dL (ref 30.0–36.0)
MCV: 90.5 fL (ref 80.0–100.0)
Platelets: 482 10*3/uL — ABNORMAL HIGH (ref 150–400)
RBC: 3.37 MIL/uL — ABNORMAL LOW (ref 4.22–5.81)
RDW: 16.6 % — ABNORMAL HIGH (ref 11.5–15.5)
WBC: 10.7 10*3/uL — ABNORMAL HIGH (ref 4.0–10.5)
nRBC: 0 % (ref 0.0–0.2)

## 2019-06-18 LAB — GLUCOSE, CAPILLARY
Glucose-Capillary: 135 mg/dL — ABNORMAL HIGH (ref 70–99)
Glucose-Capillary: 175 mg/dL — ABNORMAL HIGH (ref 70–99)
Glucose-Capillary: 140 mg/dL — ABNORMAL HIGH (ref 70–99)
Glucose-Capillary: 137 mg/dL — ABNORMAL HIGH (ref 70–99)

## 2019-06-18 LAB — BASIC METABOLIC PANEL
Anion gap: 8 (ref 5–15)
BUN: 10 mg/dL (ref 8–23)
CO2: 24 mmol/L (ref 22–32)
Calcium: 8.8 mg/dL — ABNORMAL LOW (ref 8.9–10.3)
Chloride: 105 mmol/L (ref 98–111)
Creatinine, Ser: 0.91 mg/dL (ref 0.61–1.24)
GFR calc Af Amer: >60 mL/min (ref >60)
GFR calc non Af Amer: >60 mL/min (ref >60)
Glucose, Bld: 146 mg/dL — ABNORMAL HIGH (ref 70–99)
Potassium: 4.3 mmol/L (ref 3.5–5.1)
Sodium: 137 mmol/L (ref 135–145)

## 2019-06-18 LAB — MAGNESIUM: Magnesium: 1.8 mg/dL (ref 1.7–2.4)

## 2019-06-18 MED ORDER — AMIODARONE HCL 200 MG PO TABS
200.0000 mg | ORAL_TABLET | Freq: Every day | ORAL | 0 refills | Status: DC
Start: 2019-06-22 — End: 2019-07-09

## 2019-06-18 MED ORDER — INSULIN ASPART 100 UNIT/ML FLEXPEN: PEN_INJECTOR | SUBCUTANEOUS | 0 refills | Status: DC

## 2019-06-18 MED ORDER — AMIODARONE HCL 200 MG PO TABS: 200.0000 mg | ORAL_TABLET | Freq: Two times a day (BID) | ORAL | 0 refills | Status: DC

## 2019-06-18 MED ORDER — CIPROFLOXACIN HCL 500 MG PO TABS
500.0000 mg | ORAL_TABLET | Freq: Two times a day (BID) | ORAL | 0 refills | Status: AC
Start: 2019-06-18 — End: 2019-06-23

## 2019-06-18 MED ORDER — METRONIDAZOLE 500 MG PO TABS
500.0000 mg | ORAL_TABLET | Freq: Three times a day (TID) | ORAL | 0 refills | Status: AC
Start: 2019-06-18 — End: 2019-06-23

## 2019-06-18 MED ORDER — INSULIN PEN NEEDLE 29G X 5MM MISC: 1.0000 | Freq: Three times a day (TID) | 0 refills | Status: DC | PRN

## 2019-06-18 MED ORDER — OXYCODONE HCL 10 MG PO TABS: 10.0000 mg | ORAL_TABLET | ORAL | 0 refills | Status: DC | PRN

## 2019-06-18 MED ORDER — MIDODRINE HCL 10 MG PO TABS: 10.0000 mg | ORAL_TABLET | Freq: Three times a day (TID) | ORAL | 0 refills | Status: DC

## 2019-06-18 MED FILL — CIPROFLOXACIN HCL 500 MG TA: 500 | 5 days supply | Qty: 10 | Fill #0

## 2019-06-18 MED FILL — AMIODARONE HCL 200 MG TAB: 200 | 30 days supply | Qty: 33 | Fill #0

## 2019-06-18 MED FILL — MIDODRINE HCL 10 MG TABLET: 10 | 30 days supply | Qty: 90 | Fill #0

## 2019-06-18 MED FILL — metroNIDAZOLE 500 MG TABS: 500 | 5 days supply | Qty: 15 | Fill #0

## 2019-06-18 NOTE — Discharge Summary (Signed)
Physician Discharge Summary  Patient ID: Steve Anthony MRN: [id number] DOB/AGE: 68-Apr-1952 68 y.o.  Admit date: 06/07/2019 Discharge date: 06/18/2019  Admission Diagnoses: Patient Active Problem List   Diagnosis Date Noted  . Hypotension 06/13/2019  . Protein-calorie malnutrition, severe 06/12/2019  . Pancreatic pseudocyst 06/07/2019  . Infected pancreatic pseudocyst 06/07/2019  . Pseudocyst of pancreas 03/06/2019  . Acute pancreatitis 02/20/2019  . CKD (chronic kidney disease), stage III 02/20/2019  . History of COVID-19 01/24/19 02/20/2019  . Abnormal CT of the abdomen 05/27/2018  . Recurrent pancreatitis 05/27/2018  . Idiopathic acute pancreatitis 04/14/2018  . Abnormal MRI of abdomen 04/14/2018  . Pancreatic cyst 04/14/2018  . Cirrhosis of liver without ascites ([place]) 04/14/2018  . Chest pain 12/16/2016  . Pure hypercholesterolemia   . S/P coronary artery stent placement   . Long term (current) use of anticoagulants [Z79.01] 06/10/2015  . OA (osteoarthritis) of hip 12/18/2014  . Coronary atherosclerosis 12/31/2009  . Sleep apnea 05/17/2009  . COPD without exacerbation ([place]) 05/15/2009  . SHORTNESS OF BREATH 03/19/2009  . Atrial fibrillation ([place]) 11/21/2008  . CHEST PAIN UNSPECIFIED 11/21/2008  . HYPERTRIGLYCERIDEMIA 10/15/2008  . DYSLIPIDEMIA 10/15/2008  . Essential hypertension 10/15/2008  . Unstable angina ([place]) 10/15/2008  . GERD 10/15/2008  . NEPHROLITHIASIS 10/15/2008  . PALPITATIONS 10/15/2008    Discharge Diagnoses:  Active Problems:   Pancreatic pseudocyst   Infected pancreatic pseudocyst   Protein-calorie malnutrition, severe   Hypotension   Discharged Condition: stable  Hospital Course:  Pt was admitted to the ICU following distal pancreatectomy, splenectomy, debridement of pancreatic necrosis 06/07/2019.  He required neo gtt.  He developed thrombocytopenia.  He required significant pain medications which was not surprising given his pre op pain  medications. He developed bowel function relatively quickly.  His diet was able to be advanced.  Lovenox was held and thrombocytopenia improved.      He weaned off the neosynephrine, but continued to have episodes of hypotension.  Initially it seemed like just orthostatic hypotension and he was given fluid boluses.  Midodrine was started.  He required transfusion.  Cortisol was normal.  Medicine was consulted as well to assist with management.  He was maximized on the midodrine.  His BP meds were reduced.  He had an episode of atrial fibrillation with rapid ventricular response.  Cardiology was consulted.  He was transferred back to stepdown and placed on IV amiodarone.  This was converted to oral.  He and wife kept meticulous BP records and he had had some similar issues pre op.  The hypotension became more sporatic and less associated with getting up and moving around.  He was asymptomatic. Eliquis was restarted prior to d/c.     He had poor PO intake due to not liking hospital food.  This improved when his wife was able to bring him outside food.    Consults: medicine and cardiology.    Significant Diagnostic Studies: labs: Gluc 146, HCT 30.5, WBCs 10.7k, Cr 0.91  Treatments: surgery: see above  Discharge Exam: Blood pressure 121/80, pulse 61, temperature 97.7 F (36.5 C), temperature source Oral, resp. rate 19, height 6' (1.829 m), weight 89.3 kg, SpO2 94 %. General appearance: alert, cooperative and no distress Resp: breathing normally Cardio: regular rate and rhythm GI: soft, non distended, approp tender. Drains removed.   Extremities: extremities normal, atraumatic, no cyanosis or edema  Disposition: Discharge disposition: 01-Home or Self Care       Discharge Instructions    Call MD for:  difficulty breathing, headache or visual disturbances   Complete by: As directed    Call MD for:  hives   Complete by: As directed    Call MD for:  persistant nausea and vomiting   Complete  by: As directed    Call MD for:  redness, tenderness, or signs of infection (pain, swelling, redness, odor or green/yellow discharge around incision site)   Complete by: As directed    Call MD for:  severe uncontrolled pain   Complete by: As directed    Call MD for:  temperature >100.4   Complete by: As directed    Diet - low sodium heart healthy   Complete by: As directed    Increase activity slowly   Complete by: As directed    PICC line removal   Complete by: As directed      Allergies as of 06/18/2019      Reactions   Penicillins Hives, Shortness Of Breath   Did it involve swelling of the face/tongue/throat, SOB, or low BP? Yes Did it involve sudden or severe rash/hives, skin peeling, or any reaction on the inside of your mouth or nose? No Did you need to seek medical attention at a hospital or doctor's office? Yes (emergency room) When did it last happen?1974 If all above answers are "NO", may proceed with cephalosporin use.      Medication List    TAKE these medications   acetaminophen 325 MG tablet Commonly known as: TYLENOL Take 650 mg by mouth every 6 (six) hours as needed for headache.   albuterol 108 (90 Base) MCG/ACT inhaler Commonly known as: VENTOLIN HFA Inhale 2 puffs into the lungs every 4 (four) hours as needed for wheezing or shortness of breath.   amiodarone 200 MG tablet Commonly known as: PACERONE Take 1 tablet (200 mg total) by mouth 2 (two) times daily for 3 days.   amiodarone 200 MG tablet Commonly known as: Pacerone Take 1 tablet (200 mg total) by mouth daily. Start taking on: Jun 22, 2019   cholecalciferol 25 MCG (1000 UNIT) tablet Commonly known as: VITAMIN D3 Take 1,000 Units by mouth daily.   ciprofloxacin 500 MG tablet Commonly known as: Cipro Take 1 tablet (500 mg total) by mouth 2 (two) times daily for 5 days.   Colace 100 MG capsule Generic drug: docusate sodium Take 100 mg by mouth See admin instructions. Three times a week    Eliquis 5 MG Tabs tablet Generic drug: apixaban TAKE 1 TABLET BY MOUTH TWICE A DAY What changed: how much to take   escitalopram 10 MG tablet Commonly known as: LEXAPRO Take 10 mg by mouth daily.   fenofibrate 160 MG tablet Take 160 mg by mouth daily.   ferrous sulfate 325 (65 FE) MG tablet Take 325 mg by mouth See admin instructions. Take 1 tablet 2 times a week   FLORAJEN ACIDOPHILUS PO Take 1 capsule by mouth daily.   furosemide 20 MG tablet Commonly known as: LASIX Take 40 mg by mouth daily.   gabapentin 300 MG capsule Commonly known as: NEURONTIN Take 300 mg by mouth 2 (two) times daily.   ibuprofen 200 MG tablet Commonly known as: ADVIL Take 400 mg by mouth every 6 (six) hours as needed.   Insulin Pen Needle 29G X 5MM Misc 1 Device by Does not apply route 3 (three) times daily as needed (for use with insulin pen).   Klor-Con M10 10 MEQ tablet Generic drug: potassium chloride Take 10 mEq  by mouth daily.   Melatonin 10 MG Tabs Take 10 mg by mouth at bedtime.   metFORMIN 500 MG tablet Commonly known as: GLUCOPHAGE Take 500 mg by mouth 2 (two) times daily.   metoprolol tartrate 25 MG tablet Commonly known as: LOPRESSOR Take 0.5 tablets (12.5 mg total) by mouth 2 (two) times daily.   metroNIDAZOLE 500 MG tablet Commonly known as: Flagyl Take 1 tablet (500 mg total) by mouth 3 (three) times daily for 5 days.   midodrine 10 MG tablet Commonly known as: PROAMATINE Take 1 tablet (10 mg total) by mouth 3 (three) times daily with meals.   Narcan 4 MG/0.1ML Liqd nasal spray kit Generic drug: naloxone Place 1 spray into the nose daily as needed (opioid overdose).   nitroGLYCERIN 0.4 MG SL tablet Commonly known as: NITROSTAT Place 1 tablet (0.4 mg total) under the tongue every 5 (five) minutes x 3 doses as needed for chest pain.   ondansetron 4 MG disintegrating tablet Commonly known as: Zofran ODT Take 1 tablet (4 mg total) by mouth every 6 (six) hours as  needed for nausea or vomiting. Place under tongue   OVER THE COUNTER MEDICATION Apply 1 application topically daily as needed (rash). OTC fanny cream   Oxycodone HCl 10 MG Tabs Take 1 tablet (10 mg total) by mouth every 4 (four) hours as needed for severe pain.   oxyCODONE-acetaminophen 10-325 MG tablet Commonly known as: PERCOCET Take 1 tablet by mouth every 4 (four) hours.   pantoprazole 40 MG tablet Commonly known as: PROTONIX TAKE 1 TABLET BY MOUTH TWICE A DAY   promethazine 25 MG tablet Commonly known as: PHENERGAN Take 1 tablet (25 mg total) by mouth every 8 (eight) hours as needed for nausea or vomiting.   tamsulosin 0.4 MG Caps capsule Commonly known as: FLOMAX Take 0.4 mg by mouth daily.   tiZANidine 4 MG tablet Commonly known as: ZANAFLEX Take 4 mg by mouth 2 (two) times daily.   traZODone 50 MG tablet Commonly known as: DESYREL Take 100 mg by mouth at bedtime.   VITAMIN B-12 IJ Inject 1,000 mcg as directed every 30 (thirty) days.      Follow-up Information    Stark Klein, MD Follow up on 07/02/2019.   Specialty: General Surgery Why: 12 o'clock noon.  Be here at 11:45. Contact information: [address] [place] [address] [phone]        [facility] Follow up.   Specialty: Cardiology Why: as scheduled for around 1-2 weeks.   Contact information: [address], [place]       [name], MD Follow up.   Specialty: Internal Medicine Why: 2-4 weeks if needed.   Contact information: [place] [address] [phone]        Thompson Grayer, MD .   Specialty: Cardiology Contact information: [address] [place] [address] [place] [address] [phone]           Signed: Stark Klein 06/18/2019, 10:49 AM

## 2019-06-18 NOTE — Progress Notes (Signed)
PROGRESS NOTE    Steve Anthony  VFI:[id number] DOB: Dec 07, 1950 DOA: 06/07/2019 PCP: [name], MD    Brief Narrative:  69 y.o. male with medical history significant of hypertension, dyslipidemia, CAD s/p stent, paroxysmal atrial fibrillation on Eliquis, COPD, cryptogenic cirrhosis([place]), and recurrent pancreatitis with pseudocyst who presented for treatment of a recurrent infected pancreatic pseudocyst.  Patient underwent open distal pancreatectomy, splenectomy, and debridement of pancreatic necrosis on 5/13 by Dr. [name].  Following the procedure patient was noted to be in hemorrhagic shock which he was able to be weaned off neo.  Patient has continued to have intermittent low blood pressures despite receiving blood transfusions, IV fluids, albumin, and being started on midodrine.  Denies having any chest pain, palpitations, vomiting, or diarrhea.  Patient had actually been constipated up until this morning when he had a bowel movement.  After leaving the bathroom patient felt lightheaded as though he could pass out but never lost consciousness.  At baseline he reports having issues with his blood pressures being up and down, but never had to be on medication to keep his blood pressures up.  Assessment & Plan:   Active Problems:   Pancreatic pseudocyst   Infected pancreatic pseudocyst   Protein-calorie malnutrition, severe   Hypotension  1. Recurrent pancreatitis with pancreatic pseudocyst: s/p open distal pancreatectomy, debridement of pseudopancreatic cyst, and splenectomy on 5/13 by Dr. [name].  -    Cont management per General Surgery - Abd CT performed with results reviewed, finding of fluid collection, possibility for hematoma but cannot rule out infection - Empiric abx as per General Surgery  2. Recurrent hypotension: Acute. Found to have intermittent episodes of hypotension despite receiving blood, albumin, IV fluids, and midodrine. Patient does not appear to have adrenal  insufficiency based off random cortisol level.  He does not want to be placed on any additional medications. -Pt is continued on midodrine 10 mg 3 times daily -Pt was given trial of TED hose and abdominal binder  -BP seems to fluctuate from normotension and hypotension with sbp to the 70's. Pt remains asymptomatic during these episodes -Chart reviewed, and seems to be an on-going issues, known in the outpatient setting. Of note, one one prior Cardiology visit, pt was advised to briefly hold lasix given hypotension in the office with sbp in the 70's - BP remained stable today. Recommend close outpatient follow up. -Clear for d/c today  3. Acute blood loss anemia: Hemoglobin prior to procedure was around 10, but had dropped to a low of 7.6.  Patient transfused 1 unit of packed red blood cells on 5/19. -Post-op hgb appropriately corrected. Pt remained hemodynamically stable  4. Afib RVR -New finding recently. Pt had been previously rate controlled -I had ordered amio gtt yesterday and seems to have controlled pt's rate -Appreciate Cardiology input. Plan to transition to PO amio, cont beta blocker, only hold if sbp<90 -Per Cardiology, pt to complete 5 days of 244m BID amiodarone, then transition to 2059mdaily, to be further weaned by Cardiology as outpatient  5. Hypomagnesemia - Replaced  DVT prophylaxis: SCD's Code Status: Full Family Communication: Pt in room, family not at bedside  Antimicrobials: Anti-infectives (From admission, onward)   Start     Dose/Rate Route Frequency Ordered Stop   06/16/19 1200  metroNIDAZOLE (FLAGYL) IVPB 500 mg     500 mg 100 mL/hr over 60 Minutes Intravenous Every 6 hours 06/16/19 0959     06/16/19 1100  ciprofloxacin (CIPRO) IVPB 400 mg  400 mg 200 mL/hr over 60 Minutes Intravenous 2 times daily 06/16/19 0959     06/07/19 1400  ciprofloxacin (CIPRO) IVPB 400 mg     400 mg 200 mL/hr over 60 Minutes Intravenous Every 12 hours 06/07/19 1356 06/07/19  1649   06/07/19 0600  ciprofloxacin (CIPRO) IVPB 400 mg     400 mg 200 mL/hr over 60 Minutes Intravenous On call to O.R. 06/07/19 8786 06/07/19 0806   06/07/19 0558  ciprofloxacin (CIPRO) 400 MG/200ML IVPB    Note to Pharmacy: Marga Melnick   : cabinet override      06/07/19 0558 06/07/19 0834      Subjective: Without complaints this AM. Eager to go home  Objective: Vitals:   06/17/19 1905 06/17/19 2319 06/18/19 0311 06/18/19 0713  BP: 106/74 99/67 130/81 121/80  Pulse: 70 64 60 61  Resp: 16 18 19 19   Temp: 97.9 F (36.6 C) 97.7 F (36.5 C) 97.8 F (36.6 C) 97.7 F (36.5 C)  TempSrc: Oral Oral Oral Oral  SpO2: 90% 94% 91% 94%  Weight:      Height:        Intake/Output Summary (Last 24 hours) at 06/18/2019 0951 Last data filed at 06/18/2019 7672 Gross per 24 hour  Intake 500 ml  Output 1650 ml  Net -1150 ml   Filed Weights   06/07/19 0552 06/07/19 1500 06/14/19 1858  Weight: 95.7 kg 95 kg 89.3 kg    Examination: General exam: Conversant, in no acute distress Respiratory system: normal chest rise, clear, no audible wheezing Cardiovascular system: regular rhythm, s1-s2 Gastrointestinal system: Nondistended, nontender, pos BS Central nervous system: No seizures, no tremors Extremities: No cyanosis, no joint deformities Skin: No rashes, no pallor Psychiatry: Affect normal // no auditory hallucinations   Data Reviewed: I have personally reviewed following labs and imaging studies  CBC: Recent Labs  Lab 06/14/19 0415 06/15/19 0421 06/16/19 0420 06/17/19 0325 06/18/19 0515  WBC 14.6* 11.1* 12.5* 13.9* 10.7*  HGB 10.0* 10.1* 9.5* 9.2* 9.3*  HCT 31.3* 32.2* 30.4* 29.1* 30.5*  MCV 87.9 88.7 88.1 89.0 90.5  PLT 398 481* 240 374 094*   Basic Metabolic Panel: Recent Labs  Lab 06/12/19 0457 06/12/19 0457 06/13/19 1250 06/13/19 1250 06/14/19 0415 06/15/19 0421 06/15/19 1011 06/16/19 0420 06/17/19 0325 06/18/19 0515  NA 134*   < > 132*   < > 133* 132*  --   131* 135 137  K 3.4*   < > 4.3   < > 3.9 3.9  --  4.3 4.2 4.3  CL 98   < > 96*   < > 98 99  --  98 104 105  CO2 26   < > 25   < > 25 24  --  23 24 24   GLUCOSE 148*   < > 147*   < > 150* 149*  --  140* 127* 146*  BUN 6*   < > 8   < > 10 13  --  11 10 10   CREATININE 0.95   < > 1.10   < > 1.19 1.07  --  1.06 1.09 0.91  CALCIUM 9.1   < > 9.4   < > 9.4 9.0  --  9.0 8.8* 8.8*  MG 1.3*   < > 1.5*  --   --   --  1.4* 1.9 1.6* 1.8  PHOS 3.2  --   --   --   --   --   --   --   --   --    < > =  values in this interval not displayed.   GFR: Estimated Creatinine Clearance: 86.5 mL/min (by C-G formula based on SCr of 0.91 mg/dL). Liver Function Tests: No results for input(s): AST, ALT, ALKPHOS, BILITOT, PROT, ALBUMIN in the last 168 hours. No results for input(s): LIPASE, AMYLASE in the last 168 hours. No results for input(s): AMMONIA in the last 168 hours. Coagulation Profile: No results for input(s): INR, PROTIME in the last 168 hours. Cardiac Enzymes: No results for input(s): CKTOTAL, CKMB, CKMBINDEX, TROPONINI in the last 168 hours. BNP (last 3 results) No results for input(s): PROBNP in the last 8760 hours. HbA1C: No results for input(s): HGBA1C in the last 72 hours. CBG: Recent Labs  Lab 06/17/19 1148 06/17/19 1526 06/17/19 1717 06/17/19 2240 06/18/19 0642  GLUCAP 157* 148* 163* 249* 140*   Lipid Profile: No results for input(s): CHOL, HDL, LDLCALC, TRIG, CHOLHDL, LDLDIRECT in the last 72 hours. Thyroid Function Tests: No results for input(s): TSH, T4TOTAL, FREET4, T3FREE, THYROIDAB in the last 72 hours. Anemia Panel: No results for input(s): VITAMINB12, FOLATE, FERRITIN, TIBC, IRON, RETICCTPCT in the last 72 hours. Sepsis Labs: No results for input(s): PROCALCITON, LATICACIDVEN in the last 168 hours.  Recent Results (from the past 240 hour(s))  Culture, blood (single)     Status: None (Preliminary result)   Collection Time: 06/15/19 10:12 AM   Specimen: BLOOD  Result Value  Ref Range Status   Specimen Description BLOOD LEFT ANTECUBITAL  Final   Special Requests   Final    BOTTLES DRAWN AEROBIC ONLY Blood Culture adequate volume   Culture  Setup Time   Final    GRAM POSITIVE RODS AEROBIC BOTTLE ONLY CRITICAL RESULT CALLED TO, READ BACK BY AND VERIFIED WITH: [place] AT 1607 06/17/19 BY L BENFIELD Performed at [facility], [place] [address] [place], [place] [address]    Culture GRAM POSITIVE RODS  Final   Report Status PENDING  Incomplete  Urine culture     Status: Abnormal   Collection Time: 06/16/19  5:37 PM   Specimen: Urine, Clean Catch  Result Value Ref Range Status   Specimen Description URINE, CLEAN CATCH  Final   Special Requests NONE  Final   Culture (A)  Final    <10,000 COLONIES/mL INSIGNIFICANT GROWTH Performed at [facility], [place] [address] [place], [place] [address]    Report Status 06/17/2019 FINAL  Final     Radiology Studies: No results found.  Scheduled Meds: . sodium chloride   Intravenous Once  . amiodarone  200 mg Oral BID  . apixaban  5 mg Oral BID  . Chlorhexidine Gluconate Cloth  6 each Topical Daily  . escitalopram  10 mg Oral Daily  . feeding supplement (PRO-STAT SUGAR FREE 64)  30 mL Oral TID  . fenofibrate  160 mg Oral Daily  . gabapentin  300 mg Oral TID  . insulin aspart  0-9 Units Subcutaneous TID AC & HS  . mouth rinse  15 mL Mouth Rinse BID  . melatonin  10 mg Oral QHS  . metoprolol tartrate  12.5 mg Oral BID  . midodrine  10 mg Oral TID WC  . multivitamin with minerals  1 tablet Oral Daily  . pantoprazole  40 mg Oral BID  . potassium chloride  20 mEq Oral BID  . sodium chloride flush  10-40 mL Intracatheter Q12H  . tamsulosin  0.4 mg Oral Daily  . traZODone  100 mg Oral QHS   Continuous  Infusions: . sodium chloride Stopped (06/07/19 2338)  . ciprofloxacin 400 mg (06/17/19 2212)  . metronidazole 500 mg (06/18/19 0515)     LOS: 11 days   Marylu Lund, MD [facility] Pager  On Amion  If 7PM-7AM, please contact night-coverage 06/18/2019, 9:51 AM

## 2019-06-18 NOTE — Progress Notes (Signed)
Progress Note  Patient Name: Steve Anthony Date of Encounter: 06/18/2019  Primary Cardiologist: Thompson Grayer, MD   Subjective   Sitting in chair today. Has chronic back pain but otherwise no complaints. Wants to go home. Discussed his afib, amiodarone plan, hypotension, and lasix, see below.  Inpatient Medications    Scheduled Meds: . sodium chloride   Intravenous Once  . amiodarone  200 mg Oral BID  . apixaban  5 mg Oral BID  . Chlorhexidine Gluconate Cloth  6 each Topical Daily  . escitalopram  10 mg Oral Daily  . feeding supplement (PRO-STAT SUGAR FREE 64)  30 mL Oral TID  . fenofibrate  160 mg Oral Daily  . gabapentin  300 mg Oral TID  . insulin aspart  0-9 Units Subcutaneous TID AC & HS  . mouth rinse  15 mL Mouth Rinse BID  . melatonin  10 mg Oral QHS  . metoprolol tartrate  12.5 mg Oral BID  . midodrine  10 mg Oral TID WC  . multivitamin with minerals  1 tablet Oral Daily  . pantoprazole  40 mg Oral BID  . potassium chloride  20 mEq Oral BID  . sodium chloride flush  10-40 mL Intracatheter Q12H  . tamsulosin  0.4 mg Oral Daily  . traZODone  100 mg Oral QHS   Continuous Infusions: . sodium chloride Stopped (06/07/19 2338)  . ciprofloxacin 400 mg (06/17/19 2212)  . metronidazole 500 mg (06/18/19 0515)   PRN Meds: sodium chloride, acetaminophen, albuterol, diphenhydrAMINE **OR** diphenhydrAMINE, labetalol, magic mouthwash w/lidocaine, nitroGLYCERIN, oxyCODONE, prochlorperazine **OR** prochlorperazine, promethazine, sodium chloride flush, traMADol   Vital Signs    Vitals:   06/17/19 1905 06/17/19 2319 06/18/19 0311 06/18/19 0713  BP: 106/74 99/67 130/81 121/80  Pulse: 70 64 60 61  Resp: 16 18 19 19   Temp: 97.9 F (36.6 C) 97.7 F (36.5 C) 97.8 F (36.6 C) 97.7 F (36.5 C)  TempSrc: Oral Oral Oral Oral  SpO2: 90% 94% 91% 94%  Weight:      Height:        Intake/Output Summary (Last 24 hours) at 06/18/2019 1007 Last data filed at 06/18/2019 0717 Gross  per 24 hour  Intake 500 ml  Output 1650 ml  Net -1150 ml   Last 3 Weights 06/14/2019 06/07/2019 06/07/2019  Weight (lbs) 196 lb 13.9 oz 209 lb 7 oz 211 lb  Weight (kg) 89.3 kg 95 kg 95.709 kg      Telemetry    NSR in the 60s, no afib since 06/16/19 - Personally Reviewed  ECG    5.22.21 NSR at 61 bpm - Personally Reviewed  Physical Exam   GEN: No acute distress.   Neck: No JVD Cardiac: RRR, no murmurs, rubs, or gallops.  Respiratory: Clear to auscultation bilaterally. GI: Soft, mild post op tenderness, non-distended  MS: trivial LE edema; compression stockings in place Neuro:  Nonfocal  Psych: Normal affect   Labs    High Sensitivity Troponin:  No results for input(s): TROPONINIHS in the last 720 hours.    Chemistry Recent Labs  Lab 06/16/19 0420 06/17/19 0325 06/18/19 0515  NA 131* 135 137  K 4.3 4.2 4.3  CL 98 104 105  CO2 23 24 24   GLUCOSE 140* 127* 146*  BUN 11 10 10   CREATININE 1.06 1.09 0.91  CALCIUM 9.0 8.8* 8.8*  GFRNONAA >60 >60 >60  GFRAA >60 >60 >60  ANIONGAP 10 7 8      Hematology Recent Labs  Lab 06/16/19 0420 06/17/19 0325 06/18/19 0515  WBC 12.5* 13.9* 10.7*  RBC 3.45* 3.27* 3.37*  HGB 9.5* 9.2* 9.3*  HCT 30.4* 29.1* 30.5*  MCV 88.1 89.0 90.5  MCH 27.5 28.1 27.6  MCHC 31.3 31.6 30.5  RDW 16.2* 16.2* 16.6*  PLT 240 374 482*    BNPNo results for input(s): BNP, PROBNP in the last 168 hours.   DDimer No results for input(s): DDIMER in the last 168 hours.   Radiology    No results found.  Cardiac Studies   None this admission, prior echo from 12/17/2016 personally reviewed  Patient Profile     69 y.o. male with a hx of paroxysmal atrial fibrillation  (status post CTI/PVI ablation 02/2009 and 09/2009) on Eliquis therapy, CAD status post PCI to LAD in 2006 with repeat LHC 10/2012 with stable CAD and patent stent, COPD, HTN, HLD, hyperthyroidism, OSA (not on CPAP),  DM2, history of SVT (AVNRT slow pathway ablated 2007),  recurrentpancreatitis and pancreatic pseudocysts who is being seen for the evaluation of atrial fibrillation with RVR at the request of Dr. [name].  Assessment & Plan    Paroxysmal atrial fibrillation: exacerbated likely by post op hyperadrenergic tone -continue amiodarone, 200 mg BID for a total of 5 days (today is day 2), then 200 mg daily. May be able to discontinue at outpatient follow up -continue metoprolol tartrate 12.5 mg BID for rate control, hold for SBP <90. -continue apixaban 5 mg BID -K 4.3 today, Cr 0.91, Mg 1.8  Hypotension: -on midodrine 10 mg TID -primary team has discussed compression stockings and abdominal binder -per notes, asymptomatic even with systolics in the 35T -on lasix largely due to ascites/swelling per notes. Defer to primary team, but may need to monitor lasix dose closely as an outpatient to avoid worsening hypotension.  History of CAD with prior PCI, hyperlipidemia: -no chest pain -no aspirin given apixaban and remote stent -on fenofibrate  Recurrent pancreatitis and pancreatic pseudocysts: s/p distal pancreatectomy, splenectomy, debridement of pancreatic necrosis 06/07/19 -found to have massive gastric varices as well from portal hypertension.  [facility] will sign off.   Medication Recommendations:  continue amiodarone, 200 mg BID for a total of 5 days (today is day 2), then 200 mg daily. May be able to discontinue at outpatient follow up. Continue apixaban 5 mg BID. Continue metoprolol tartrate 12.5 mg BID for rate control, hold for SBP <90. Defer furosemide dose changes to primary/medicine team as this appears to be more for ascites/portal hypertension. Other recommendations (labs, testing, etc):  BMET 1 week Follow up as an outpatient:  We will arrange for outpatient cardiology follow up with EP.  For questions or updates, please contact [place] Please consult [email or web address] for contact info under    Signed, Buford Dresser, MD    06/18/2019, 10:07 AM

## 2019-06-18 NOTE — Plan of Care (Signed)
Patient is educated and ready to be discharged

## 2019-06-18 NOTE — Progress Notes (Signed)
Staples removed from abdomen. IV's out and will be taking oral antibiotics at home

## 2019-06-18 NOTE — Progress Notes (Signed)
Physical Therapy Treatment Patient Details Name: Steve Anthony MRN: [id number] DOB: Jun 21, 1950 Today's Date: 06/18/2019    History of Present Illness 69 year old male who presents with acute pancreatitis. Pt has a complicated history involving pancreatitis and pancreatic pseudocysts, starting in August 2019. Pt with most recent infection in February 2021 with aspiration which grew enterococcus fecalis. Pt with nausea and vomiting with attempts at eating/drinking. Pt underwent Distal pancreatectomy, splenectomy, debridement of pancreatic necrosis on 06/08/19.    PT Comments    Patient progressing with ambulation distance and tolerance to all upright activity with BP stable.  Plans for d/c home today.  Reports refused HHPT and saw in case manager note.  Educated on safety with mobility in the home.  PT to follow if pt not d/c.    Follow Up Recommendations  Supervision/Assistance - 24 hour;No PT follow up(pt refused HHPT)     Equipment Recommendations  None recommended by PT    Recommendations for Other Services       Precautions / Restrictions Precautions Precautions: Fall Precaution Comments: watch BP Required Braces or Orthoses: Other Brace Other Brace: abdominal binder, TEDS    Mobility  Bed Mobility               General bed mobility comments: up in chair  Transfers Overall transfer level: Needs assistance Equipment used: None Transfers: Sit to/from Stand Sit to Stand: Supervision         General transfer comment: assist for safety  Ambulation/Gait Ambulation/Gait assistance: Min guard Gait Distance (Feet): 150 Feet Assistive device: None Gait Pattern/deviations: Step-through pattern;Decreased stride length;Drifts right/left;Staggering left     General Gait Details: assist for balance and some instability with gait, reports has built up shoe that helps at home   Stairs             Wheelchair Mobility    Modified Rankin (Stroke Patients Only)        Balance Overall balance assessment: Needs assistance Sitting-balance support: Feet supported Sitting balance-Leahy Scale: Good       Standing balance-Leahy Scale: Fair Standing balance comment: no assist in static standing                            Cognition Arousal/Alertness: Awake/alert Behavior During Therapy: WFL for tasks assessed/performed Overall Cognitive Status: Within Functional Limits for tasks assessed                                        Exercises      General Comments General comments (skin integrity, edema, etc.): BP After ambulation 117/68, no c/o symptoms with mobility this session; discussed safety with mobility at home as pt has refused HHPT, sitting prior to standing, using device if feeling unsteady (has cane and rollator at home), using shower seat, etc.      Pertinent Vitals/Pain Pain Score: 4  Pain Location: back and belly "a little" Pain Descriptors / Indicators: Discomfort Pain Intervention(s): Monitored during session;Repositioned    Home Living                      Prior Function            PT Goals (current goals can now be found in the care plan section) Progress towards PT goals: Progressing toward goals    Frequency  Min 3X/week      PT Plan Current plan remains appropriate    Co-evaluation              AM-PAC PT "6 Clicks" Mobility   Outcome Measure  Help needed turning from your back to your side while in a flat bed without using bedrails?: None Help needed moving from lying on your back to sitting on the side of a flat bed without using bedrails?: None Help needed moving to and from a bed to a chair (including a wheelchair)?: A Little Help needed standing up from a chair using your arms (e.g., wheelchair or bedside chair)?: A Little Help needed to walk in hospital room?: A Little Help needed climbing 3-5 steps with a railing? : A Little 6 Click Score: 20    End of  Session   Activity Tolerance: Patient tolerated treatment well Patient left: in bed;with call bell/phone within reach   PT Visit Diagnosis: Unsteadiness on feet (R26.81);Other abnormalities of gait and mobility (R26.89);Muscle weakness (generalized) (M62.81);Pain     Time: 3614-4315 PT Time Calculation (min) (ACUTE ONLY): 19 min  Charges:  $Gait Training: 8-22 mins                     Steve Anthony, [place] [phone] 06/18/2019    Steve Anthony 06/18/2019, 4:40 PM

## 2019-06-18 NOTE — Progress Notes (Addendum)
20 staples removed. Site is cleaned dry and intact. Gave oxy at 1230 told him he can take more at 430.

## 2019-06-18 NOTE — TOC Transition Note (Signed)
Transition of Care [facility]) - CM/SW Discharge Note Marvetta Gibbons RN,BSN Transitions of Care Unit 4NP (non trauma) - RN Case Manager [phone]   Patient Details  Name: Steve Anthony MRN: [id number] Date of Birth: 03-31-1950  [facility] CM/SW Contact:  Dawayne Patricia, RN Phone Number: 06/18/2019, 2:11 PM   Clinical Narrative:    Pt stable for transition home today, pt declining [place] services- no orders placed. Has all needed DME at home. Pt to return home with wife no TOC needs noted.    Final next level of care: Home/Self Care Barriers to Discharge: No Barriers Identified   Patient Goals and CMS Choice Patient states their goals for this hospitalization and ongoing recovery are:: to return to home CMS Medicare.gov Compare Post Acute Care list provided to:: Patient Choice offered to / list presented to : Patient  Discharge Placement               Home/self care        Discharge Plan and Services   Discharge Planning Services: CM Consult Post Acute Care Choice: Home Health          DME Arranged: N/A DME Agency: NA       HH Arranged: Patient Refused [place] Agency: NA        Social Determinants of Health ([place]) Interventions     Readmission Risk Interventions Readmission Risk Prevention Plan 06/18/2019 06/12/2019  Transportation Screening - Complete  PCP or Specialist Appt within 5-7 Days - Complete  PCP or Specialist Appt within 3-5 Days Complete -  Home Care Screening - Complete  Medication Review (RN CM) - Referral to Pharmacy  [place] or Home Care Consult Complete -  Social Work Consult for [place] Planning/Counseling Complete -  [place] Screening Not Applicable -  Medication Review Press photographer) Complete -  Some recent data might be hidden

## 2019-06-22 LAB — CULTURE, BLOOD (SINGLE): Special Requests: ADEQUATE

## 2019-06-30 NOTE — Progress Notes (Signed)
Cardiology Office Note Date:  06/30/2019  Patient ID:  Steve Anthony [birth year], MRN [id number] PCP:  [name], MD  Electrophysiologist:  Dr. Rayann Anthony   Chief Complaint:  post hospital visit   History of Present Illness: Steve Anthony is a 69 y.o. male with history of CAD (PCI 2006 LAD, notes report cath Oct 2014 revealed stable CAD with patent stent to mid LAD), COPD, HTN, HLD, hyperthyroidism, OSA (not on CPAP), AFIB, flutter (s/p CTI/PVI ablation 02/2009 and 09/2009), DM, SVT (AVNRT slow pathway ablated 2007), recurrent pancreatitis and pancreatic pseudocysts.   He saw Dr. Alinda Anthony 05/24/19 for R foot numbness.  His BP 78/44, he had been taking extra lasix for LE swelling an the Toprol BID (though not that AM). Recommended to go to the ER for hydration, he declined.  Discussed with the daughter who mentioned historically with the ascites he has had LE numbness 2/2 pressure from the swelling. The pt was recommended to not take the metoprolol that day and hold for SBP < 110.  The daughter called our office to address his BP worried his pancreas surgery would get cancelled if BP was not appropriate./OK   06/05/2019 There is an H&P dated yesterday by Dr. Barry Anthony, his note discusses (I suspect some is carried from an older note) "He has been admitted for this several times in the last two years. It appears that the pancreatitis started in August 2019. He was being followed for cirrhosis and splenomegaly. He has had infection of the pseudocyst multiple times, the last of which was in february of this year. He has aspiration and grew enterococcus fecalis. He has been seeing Dr. Lyndel Anthony at [place]. He still has a PICC line The best he has felt for around 2-4 days post aspiration in february. At this point, he is having trouble taking in anything other than clears. He throws up if he tries to eat other food" ct abd/pelvis 03/29/2019 IMPRESSION: 1. Stable large pseudocyst in the tail of  the pancreas. No interval change. 2. Portal hypertension with extensive venous collateralization. 3. New small splenic infarction. PERCUTANEOUS DRAINAGE OF ABDOMINAL ABSCESS (49406) - STAT (DISCUSSED WITH DR Kathlene Cote. temporizing perc drain to get to surgery. ; urgent (this week)) LIPASE (27078) PARTIAL GASTRIC OUTLET OBSTRUCTION (K31.1) Impression: Will check labs to assess for dehydration and possible reinfection of cyst. He may need to be admitted for fluids if drainage cannot be performed soon. Current Plans METABOLIC PANEL, COMPREHENSIVE (80053) CBC, PLATELETS & AUT DIFF (67544) SEVERE PROTEIN-CALORIE MALNUTRITION (E43) Impression: Will check prealbumin to see if he needs TPN or other supplementation priro to surgery.    I saw him 06/05/2019 for pre-op visit  He comes in today to be seen for Dr. Rayann Anthony, last seen by him via tele health visit Dec 2020 he was doing well, planned for annual visits, no changes were made. He comes at his daughter's insistence 2/2 low BP readings of late. He is scheduled for: Procedure: OPEN DISTAL PANCREATECTOMY/SPLENECTOMY  He was evaluated via our pre-op clinic/service and cleared back in March for his surgery, and by [facility] for 1-2 days off Eliquis pre-op  He tells me he was having cramps in his legs when he went to see the PMD and was found to have low BP.  He felt well that day without symptoms. He says he was taking the diuretics (lasix and aldactone) then but not any extra. He tells me he is on the water pills for swelling because  of his varicose veins. He has not taken any since that visit. He has taken a few doses of his metoprolol with BP in the AM >110, though all of his afternoon BP have been lower so no afternoon doses. He has taken no metoprolol at all since the AM of 06/02/19  I do not see aldactone 37m daily on his list with [place] he says he is no longer taking it  He tells me all in all he feels "fine".  He is very anxious to get this  surgery behind him already.  He has a drain L abdomen, picc RUE. and has been waiting for all the pieces to get into place for his surgery for months.   He reports from Feb when he was in the hospital so sick with his pancrease he weighed 246 and got down to 178lbs, he has put some weight back on over the last month-6weeks with his appetite hase been much imporved, but is trying to keep it down to about where he is "low 200s", he weighs 211 today  He denies any kind of CP, palpitations or cardiac awareness.  He denies any kind of physical limitations, runs after his gradkids, is on the move all the time, he can do his yard work, but gets hlp over the years, says he can still [place] No dizzy spells, no near syncope or syncope.  He is specific stating that even when his BP is very low, he feels no symptoms. No SOB or DOE.  He denies symptoms of PND or orthopnea.   RCRI score is 6.6% DUKE activity/METS is very good at 8.97   TODAY He is now s/p distal pancreatectomy, splenectomy, debridement of pancreatic necrosis 06/07/2019.  He did have post operative hypotension requiring pressors and eventually midodrine.  He had post op Afib IV amiodarone >> PO.  His Eliquis was resumed, discharged 06/18/19. Last cardiology note reports [place] was in [place] planned for Amio 2085mBID for 5 days then daily, with suggestion to stop outpt follow up if maintaining SR   He feels well!  Very minimal abd discomfort post-op, only when first up and moving around.  Has home health RN visiting and helping out.  He ios thrilled to have the drain out, picc is out as well.  He reports his home cuff BP are "really good" rarely <110systolic. He has no CP, palpitations, SOB, no cardiac awareness of any kind He will occasionally feel fleetingly lightheaded upon standing, no dizziness otherwise, no near syncope or syncope.  He is on his Eliquis, no bleeding or signs of bleeding reported  He tells me he only takes the lasix/K+ PRN for  edema, since home only once or twice.  He is certain, only taking the K+ when he takes furosemide.  Past Medical History:  Diagnosis Date  . Anemia   . Arthritis   . Asthma   . CAD (coronary artery disease)    a. S/P PCI mid LAD (vision stent) 08/31/2004;  b. repeat cath 6/11 - no progression of CAD. c. Cath 10/2012 given moderate CAD on cardiac CT - no change from prior cath.  . Chest pain 12/16/2016  . COPD (chronic obstructive pulmonary disease) ([place]  . COVID-19 virus infection   . Diverticulitis   . DJD (degenerative joint disease) of lumbar spine   . Dyslipidemia   . Gallstones   . GERD (gastroesophageal reflux disease)   . History of kidney stones   . Hypertension   .  Hyperthyroidism   . Melanoma ([place])    melanoma removed from neck   . On home oxygen therapy    "2L q hs" (11/08/2012),  06/04/19 pt. not on oxygen  . OSA (obstructive sleep apnea)    a. cpap noncompliance- patient reports on 12/10/14- does not use CPAP  . Palpitations    a. 10/2012 - s/p LINQ loop recorder to assess for arrhythmia.   . Pancreatitis   . Paroxysmal A-fib ([place])    a. S/P PVI 2/11 and 9/11  . PICC (peripherally inserted central catheter) in place   . Pneumonia   . SVT (supraventricular tachycardia) ([place])    a. Atypical AVNRT of the slow pathway  . Type II diabetes mellitus ([place])     Past Surgical History:  Procedure Laterality Date  . ATRIAL ABLATION SURGERY  2007   S/P slow pathway ablation for atypical AVNRT   . BIOPSY  07/17/2018   Procedure: BIOPSY;  Surgeon: Rush Landmark Telford Nab., MD;  Location: [place];  Service: Gastroenterology;;  . [facility]in [facility]  ~ 2008  . COLON RESECTION  02/2013   . COLONOSCOPY  12/25/2007   Colonic polyp status post polypectomy. Internal hemorrhoids.   . CORONARY ANGIOPLASTY WITH STENT PLACEMENT  08/30/2004   Vision; to LAD   . CYSTOSCOPY W/ STONE MANIPULATION  ~ 1998   "once" (11/08/2012)  . EP Study  2008   Negative EP study in [place]  .  ESOPHAGOGASTRODUODENOSCOPY (EGD) WITH PROPOFOL N/A 07/17/2018   Procedure: ESOPHAGOGASTRODUODENOSCOPY (EGD) WITH PROPOFOL;  Surgeon: Rush Landmark Telford Nab., MD;  Location: [place];  Service: Gastroenterology;  Laterality: N/A;  . FRACTURE SURGERY    . LEFT HEART CATHETERIZATION WITH CORONARY ANGIOGRAM N/A 11/10/2012   Procedure: LEFT HEART CATHETERIZATION WITH CORONARY ANGIOGRAM;  Surgeon: Burnell Blanks, MD;  Location: [facility] CATH LAB;  Service: Cardiovascular;  Laterality: N/A;  . LOOP RECORDER IMPLANT N/A 11/10/2012   Medtronic LinQ implanted by Dr Steve Anthony for afib management  . [place] SURGERY  2000; 2002  . POSTERIOR LUMBAR FUSION  2004  . PROSTATE SURGERY  ~ 2009  . PVI/ CTI ablation  02/25/2009 and 09/2009   S/P afib/ CTI ablation  . SPLENECTOMY, TOTAL N/A 06/07/2019   Procedure: SPLENECTOMY;  Surgeon: Stark Klein, MD;  Location: [place];  Service: General;  Laterality: N/A;  . [place]   "broke in 3 places; had rod put in" (11/08/2012)  . TIBIA HARDWARE REMOVAL Right ~ 1991  . TONSILLECTOMY  1965  . TOTAL HIP ARTHROPLASTY Right 12/18/2014   Procedure: RIGHT TOTAL HIP ARTHROPLASTY ANTERIOR APPROACH;  Surgeon: Gaynelle Arabian, MD;  Location: [facility];  Service: Orthopedics;  Laterality: Right;  . UPPER ESOPHAGEAL ENDOSCOPIC ULTRASOUND (EUS) N/A 07/17/2018   Procedure: UPPER ESOPHAGEAL ENDOSCOPIC ULTRASOUND (EUS);  Surgeon: Irving Copas., MD;  Location: [place];  Service: Gastroenterology;  Laterality: N/A;    Current Outpatient Medications  Medication Sig Dispense Refill  . acetaminophen (TYLENOL) 325 MG tablet Take 650 mg by mouth every 6 (six) hours as needed for headache.     . albuterol (VENTOLIN HFA) 108 (90 Base) MCG/ACT inhaler Inhale 2 puffs into the lungs every 4 (four) hours as needed for wheezing or shortness of breath.     Marland Kitchen amiodarone (PACERONE) 200 MG tablet Take 1 tablet (200 mg total) by mouth 2 (two) times daily for 3 days.  6 tablet 0  . amiodarone (PACERONE) 200 MG tablet Take 1 tablet (200 mg total) by mouth  daily. 30 tablet 0  . cholecalciferol (VITAMIN D3) 25 MCG (1000 UNIT) tablet Take 1,000 Units by mouth daily.    . Cyanocobalamin (VITAMIN B-12 IJ) Inject 1,000 mcg as directed every 30 (thirty) days.    Marland Kitchen docusate sodium (COLACE) 100 MG capsule Take 100 mg by mouth See admin instructions. Three times a week    . ELIQUIS 5 MG TABS tablet TAKE 1 TABLET BY MOUTH TWICE A DAY (Patient taking differently: Take 5 mg by mouth 2 (two) times daily. ) 180 tablet 1  . escitalopram (LEXAPRO) 10 MG tablet Take 10 mg by mouth daily.  1  . fenofibrate 160 MG tablet Take 160 mg by mouth daily.      . ferrous sulfate 325 (65 FE) MG tablet Take 325 mg by mouth See admin instructions. Take 1 tablet 2 times a week    . furosemide (LASIX) 20 MG tablet Take 40 mg by mouth daily.    Marland Kitchen gabapentin (NEURONTIN) 300 MG capsule Take 300 mg by mouth 2 (two) times daily.     Marland Kitchen ibuprofen (ADVIL) 200 MG tablet Take 400 mg by mouth every 6 (six) hours as needed.    . Insulin Pen Needle 29G X 5MM MISC 1 Device by Does not apply route 3 (three) times daily as needed (for use with insulin pen). 100 each 0  . KLOR-CON M10 10 MEQ tablet Take 10 mEq by mouth daily.    . Lactobacillus (FLORAJEN ACIDOPHILUS PO) Take 1 capsule by mouth daily.    . Melatonin 10 MG TABS Take 10 mg by mouth at bedtime.     . metFORMIN (GLUCOPHAGE) 500 MG tablet Take 500 mg by mouth 2 (two) times daily.    . metoprolol tartrate (LOPRESSOR) 25 MG tablet Take 0.5 tablets (12.5 mg total) by mouth 2 (two) times daily. 90 tablet 0  . midodrine (PROAMATINE) 10 MG tablet Take 1 tablet (10 mg total) by mouth 3 (three) times daily with meals. 90 tablet 0  . naloxone (NARCAN) 4 MG/0.1ML LIQD nasal spray kit Place 1 spray into the nose daily as needed (opioid overdose).    . nitroGLYCERIN (NITROSTAT) 0.4 MG SL tablet Place 1 tablet (0.4 mg total) under the tongue every 5 (five)  minutes x 3 doses as needed for chest pain. 25 tablet 1  . ondansetron (ZOFRAN ODT) 4 MG disintegrating tablet Take 1 tablet (4 mg total) by mouth every 6 (six) hours as needed for nausea or vomiting. Place under tongue 30 tablet 1  . OVER THE COUNTER MEDICATION Apply 1 application topically daily as needed (rash). OTC fanny cream    . oxyCODONE 10 MG TABS Take 1 tablet (10 mg total) by mouth every 4 (four) hours as needed for severe pain. 42 tablet 0  . oxyCODONE-acetaminophen (PERCOCET) 10-325 MG tablet Take 1 tablet by mouth every 4 (four) hours.     . pantoprazole (PROTONIX) 40 MG tablet TAKE 1 TABLET BY MOUTH TWICE A DAY 180 tablet 1  . promethazine (PHENERGAN) 25 MG tablet Take 1 tablet (25 mg total) by mouth every 8 (eight) hours as needed for nausea or vomiting. 30 tablet 2  . Tamsulosin HCl (FLOMAX) 0.4 MG CAPS Take 0.4 mg by mouth daily.      Marland Kitchen tiZANidine (ZANAFLEX) 4 MG tablet Take 4 mg by mouth 2 (two) times daily.     . traZODone (DESYREL) 50 MG tablet Take 100 mg by mouth at bedtime.      No current  facility-administered medications for this visit.    Allergies:   Penicillins   Social History:  The patient  reports that he quit smoking about 15 years ago. His smoking use included cigarettes. He has a 114.00 pack-year smoking history. He quit smokeless tobacco use about 15 years ago.  His smokeless tobacco use included chew. He reports previous alcohol use. He reports that he does not use drugs.   Family History:  The patient's family history includes Diabetes in his father; Emphysema in his maternal grandfather and mother; Heart attack in his father; Heart disease in his father and paternal grandfather; Hypertension in his father.  ROS:  Please see the history of present illness.   All other systems are reviewed and otherwise negative.   PHYSICAL EXAM:  VS:  There were no vitals taken for this visit. BMI: There is no height or weight on file to calculate BMI. Well nourished,  well developed, in no acute distress, appears chronically ill, older then his age 6: normocephalic, atraumatic  Neck: no JVD, carotid bruits or masses Cardiac:  RRR; has some extrasystoles, no significant murmurs, no rubs, or gallops Lungs:  CTA b/l, no wheezing, rhonchi or rales  Abd: abd is soft and nontender to light palpation, wound appears well healed. MS: no deformity, advanced atrophy Ext: he has no pitting edema, he has large varicose veins RLE especially with some brawny type, minimal edema L>R Skin: warm and dry, no rash, not diaphoretic Neuro:  No gross deficits appreciated Psych: euthymic mood, full affect   EKG:  SR 65bpm, no ST/T changes  12/17/2016: TTE Study Conclusions  - Left ventricle: The cavity size was normal. Wall thickness was  increased in a pattern of mild LVH. Systolic function was normal.  The estimated ejection fraction was in the range of 55% to 60%.  Wall motion was normal; there were no regional wall motion  abnormalities. Left ventricular diastolic function parameters  were normal.  - Left atrium: The atrium was mildly dilated.    11/10/2012: [facility] Angiographic Findings:  Left main: Aneurysmal segment at distal portion of vessel. Unchanged from last cath. No obstructive disease.   Left Anterior Descending Artery: Large caliber vessel that courses to the apex. There are two small caliber diagonal branches. The proximal segment of the LAD was aneurysmal. The mid LAD just beyond the diagonal branch has a focal 50% stenosis, unchanged from last cath in 2011. The stent in the mid LAD is patent without restenosis. The distal vessel has mild plaque disease.   Circumflex Artery: Large caliber vessel with aneurysmal proximal segment. The first obtuse marginal branch is bifurcating vessel with mild plaque disease. The distal AV groove Circumflex has diffuse 30% stenosis.   Right Coronary Artery: Large dominant vessel with proximal 30%  stenosis, serial 20% stenoses in the mid and distal vessel.   Left Ventricular Angiogram: Deferred.   Impression: 1. Stable single vessel CAD with patent stent mid LAD, stable moderate stenosis mid LAD   Recent Labs: 06/04/2019: ALT 10 06/13/2019: TSH 1.776 06/18/2019: BUN 10; Creatinine, Ser 0.91; Hemoglobin 9.3; Magnesium 1.8; Platelets 482; Potassium 4.3; Sodium 137  02/20/2019: Cholesterol 113; HDL 20; LDL Cholesterol 38; Total CHOL/HDL Ratio 5.7; Triglycerides 276; VLDL 55   CrCl cannot be calculated (Unknown ideal weight.).   Wt Readings from Last 3 Encounters:  06/14/19 196 lb 13.9 oz (89.3 kg)  06/05/19 211 lb (95.7 kg)  06/04/19 214 lb 6.4 oz (97.3 kg)     Other studies reviewed: Additional  studies/records reviewed today include: summarized above  ASSESSMENT AND PLAN:  1. Paroxysmal AFib     CHA2DS2Vasc is 4, on Eliquis,       Initially on auscultation his rhythm sounded irregular though settled and his EKG confirmed SR He is only out a couple weeks from his surgery, had RVR post-op For now will keep the amiodarone, though with plans to stop in the near future.  He is doing great post-op, do not wantto run into Afib if we can avoid it  2. CAD     No anginal symptoms     He has an excellent DUKE score with >8METS       3. HTN     Now with relative hypotension, infrequent and fleeting orthostatic symptoms     On midodrine post-op     He has home BP readings (done with his home machine) low98/73 and high 143/91, most are 110s  He has good appetite post op.  He is asked to bring his home cuff to his next viait and when his home health RN visits to ask her to confirm his home cuff as accurate or not. Hopefully we can get him off of midodrine, or lower dose    Disposition: have him back in 3-4 weeks with plans to stop amio, perhaps try to reduce, midodrine.  He is asked to let us know if he starts to have high BP readings     Current medicines are reviewed at  length with the patient today.  The patient did not have any concerns regarding medicines  Signed, Steve Standard, PA-C 06/30/2019 9:33 AM     [address] [place] [address] [phone] (office)  [phone] (fax)

## 2019-07-02 ENCOUNTER — Ambulatory Visit: Payer: Medicare PPO | Admitting: Physician Assistant

## 2019-07-02 ENCOUNTER — Other Ambulatory Visit: Payer: Self-pay

## 2019-07-02 VITALS — BP 90/62 | HR 68 | Ht 72.0 in | Wt 200.0 lb

## 2019-07-02 DIAGNOSIS — I10 Essential (primary) hypertension: Secondary | ICD-10-CM | POA: Diagnosis not present

## 2019-07-02 DIAGNOSIS — I9589 Other hypotension: Secondary | ICD-10-CM

## 2019-07-02 DIAGNOSIS — I2510 Atherosclerotic heart disease of native coronary artery without angina pectoris: Secondary | ICD-10-CM

## 2019-07-02 DIAGNOSIS — I480 Paroxysmal atrial fibrillation: Secondary | ICD-10-CM

## 2019-07-02 NOTE — Patient Instructions (Signed)
Medication Instructions:  Your physician recommends that you continue on your current medications as directed. Please refer to the Current Medication list given to you today.  *If you need a refill on your cardiac medications before your next appointment, please call your pharmacy*   Lab Work: [place]   If you have labs (blood work) drawn today and your tests are completely normal, you will receive your results only by: Marland Kitchen MyChart Message (if you have MyChart) OR . A paper copy in the mail If you have any lab test that is abnormal or we need to change your treatment, we will call you to review the results.   Testing/Procedures: NONE ORDERED  TODAY   Follow-Up: At [facility], you and your health needs are our priority.  As part of our continuing mission to provide you with exceptional heart care, we have created designated Provider Care Teams.  These Care Teams include your primary Cardiologist (physician) and Advanced Practice Providers (APPs -  Physician Assistants and Nurse Practitioners) who all work together to provide you with the care you need, when you need it.  We recommend signing up for the patient portal called "MyChart".  Sign up information is provided on this After Visit Summary.  MyChart is used to connect with patients for Virtual Visits (Telemedicine).  Patients are able to view lab/test results, encounter notes, upcoming appointments, etc.  Non-urgent messages can be sent to your provider as well.   To learn more about what you can do with MyChart, go to [email or web address].    Your next appointment:   3 week(s)  The format for your next appointment:   In Person  Provider:    You may see Tommye Standard, PA-C     Other Instructions

## 2019-07-09 ENCOUNTER — Telehealth: Payer: Self-pay | Admitting: Internal Medicine

## 2019-07-09 ENCOUNTER — Other Ambulatory Visit: Payer: Self-pay | Admitting: *Deleted

## 2019-07-09 MED ORDER — AMIODARONE HCL 200 MG PO TABS: 200.0000 mg | ORAL_TABLET | Freq: Every day | ORAL | 3 refills | Status: DC

## 2019-07-09 MED ORDER — MIDODRINE HCL 10 MG PO TABS: 10.0000 mg | ORAL_TABLET | Freq: Three times a day (TID) | ORAL | 3 refills | Status: DC

## 2019-07-09 NOTE — Telephone Encounter (Signed)
New Message    *STAT* If patient is at the pharmacy, call can be transferred to refill team.   1. Which medications need to be refilled? (please list name of each medication and dose if known) midodrine (PROAMATINE) 10 MG tablet amiodarone (PACERONE) 200 MG tablet  2. Which pharmacy/location (including street and city if local pharmacy) is medication to be sent to? [id number] [place] [address] 3. Do they need a 30 day or 90 day supply? 30 or 90

## 2019-07-09 NOTE — Telephone Encounter (Signed)
Rx's have been sent in as requested.

## 2019-07-21 ENCOUNTER — Other Ambulatory Visit: Payer: Self-pay | Admitting: Physician Assistant

## 2019-07-24 ENCOUNTER — Other Ambulatory Visit: Payer: Self-pay | Admitting: Internal Medicine

## 2019-07-24 NOTE — Telephone Encounter (Signed)
Eliquis 73m refill request received. Patient is 69years old, weight-90.7kg, Crea-0.91 on 06/18/2019, Diagnosis-Afib, and last seen by RTommye Standardon 07/02/2019. Dose is appropriate based on dosing criteria. Will send in refill to requested pharmacy.

## 2019-07-30 NOTE — Progress Notes (Signed)
Cardiology Office Note Date:  08/01/2019  Patient ID:  Steve Anthony Sep 07, 1950, MRN [id number] PCP:  [name], MD  Electrophysiologist:  Dr. Rayann Heman   Chief Complaint:  planned f/u   History of Present Illness: Steve Anthony is a 69 y.o. male with history of CAD (PCI 2006 LAD, notes report cath Oct 2014 revealed stable CAD with patent stent to mid LAD), COPD, HTN, HLD, hyperthyroidism, OSA (not on CPAP), AFIB, flutter (s/p CTI/PVI ablation 02/2009 and 09/2009), DM, SVT (AVNRT slow pathway ablated 2007), recurrent pancreatitis and pancreatic pseudocysts.   He saw Dr. Alinda Money 05/24/19 for R foot numbness.  His BP 78/44, he had been taking extra lasix for LE swelling an the Toprol BID (though not that AM). Recommended to go to the ER for hydration, he declined.  Discussed with the daughter who mentioned historically with the ascites he has had LE numbness 2/2 pressure from the swelling. The pt was recommended to not take the metoprolol that day and hold for SBP < 110.  The daughter called our office to address his BP worried his pancreas surgery would get cancelled if BP was not appropriate./OK   06/05/2019 There is an H&P dated yesterday by Dr. [name], his note discusses (I suspect some is carried from an older note) "He has been admitted for this several times in the last two years. It appears that the pancreatitis started in August 2019. He was being followed for cirrhosis and splenomegaly. He has had infection of the pseudocyst multiple times, the last of which was in february of this year. He has aspiration and grew enterococcus fecalis. He has been seeing Dr. Lyndel Safe at [place]. He still has a PICC line The best he has felt for around 2-4 days post aspiration in february. At this point, he is having trouble taking in anything other than clears. He throws up if he tries to eat other food" ct abd/pelvis 03/29/2019 IMPRESSION: 1. Stable large pseudocyst in the tail of the  pancreas. No interval change. 2. Portal hypertension with extensive venous collateralization. 3. New small splenic infarction. PERCUTANEOUS DRAINAGE OF ABDOMINAL ABSCESS (49406) - STAT (DISCUSSED WITH DR Kathlene Cote. temporizing perc drain to get to surgery. ; urgent (this week)) LIPASE (63875) PARTIAL GASTRIC OUTLET OBSTRUCTION (K31.1) Impression: Will check labs to assess for dehydration and possible reinfection of cyst. He may need to be admitted for fluids if drainage cannot be performed soon. Current Plans METABOLIC PANEL, COMPREHENSIVE (80053) CBC, PLATELETS & AUT DIFF (64332) SEVERE PROTEIN-CALORIE MALNUTRITION (E43) Impression: Will check prealbumin to see if he needs TPN or other supplementation priro to surgery.    I saw him 06/05/2019 for pre-op visit  He comes in today to be seen for Dr. Rayann Heman, last seen by him via tele health visit Dec 2020 he was doing well, planned for annual visits, no changes were made. He comes at his daughter's insistence 2/2 low BP readings of late. He is scheduled for: Procedure: OPEN DISTAL PANCREATECTOMY/SPLENECTOMY  He was evaluated via our pre-op clinic/service and cleared back in March for his surgery, and by [facility] for 1-2 days off Eliquis pre-op  He tells me he was having cramps in his legs when he went to see the PMD and was found to have low BP.  He felt well that day without symptoms. He says he was taking the diuretics (lasix and aldactone) then but not any extra. He tells me he is on the water pills for swelling because of  his varicose veins. He has not taken any since that visit. He has taken a few doses of his metoprolol with BP in the AM >110, though all of his afternoon BP have been lower so no afternoon doses. He has taken no metoprolol at all since the AM of 06/02/19  I do not see aldactone 6m daily on his list with [place] he says he is no longer taking it  He tells me all in all he feels "fine".  He is very anxious to get this surgery  behind him already.  He has a drain L abdomen, picc RUE. and has been waiting for all the pieces to get into place for his surgery for months.   He reports from Feb when he was in the hospital so sick with his pancrease he weighed 246 and got down to 178lbs, he has put some weight back on over the last month-6weeks with his appetite hase been much imporved, but is trying to keep it down to about where he is "low 200s", he weighs 211 today  He denies any kind of CP, palpitations or cardiac awareness.  He denies any kind of physical limitations, runs after his gradkids, is on the move all the time, he can do his yard work, but gets hlp over the years, says [place] No dizzy spells, no near syncope or syncope.  He is specific stating that even when his BP is very low, he feels no symptoms. No SOB or DOE.  He denies symptoms of PND or orthopnea.   RCRI score is 6.6% DUKE activity/METS is very good at 8.97   07/02/2019 He is now s/p distal pancreatectomy, splenectomy, debridement of pancreatic necrosis 06/07/2019.  He did have post operative hypotension requiring pressors and eventually midodrine.  He had post op Afib IV amiodarone >> PO.  His Eliquis was resumed, discharged 06/18/19. Last cardiology note reports [place] for [place] 204mBID for 5 days then daily, with suggestion to stop outpt follow up if maintaining SR  He feels well!  Very minimal abd discomfort post-op, only when first up and moving around.  Has home health RN visiting and helping out.  He ios thrilled to have the drain out, picc is out as well.  He reports his home cuff BP are "really good" rarely <110systolic. He has no CP, palpitations, SOB, no cardiac awareness of any kind He will occasionally feel fleetingly lightheaded upon standing, no dizziness otherwise, no near syncope or syncope.  He is on his Eliquis, no bleeding or signs of bleeding reported  He tells me he only takes the lasix/K+ PRN for edema,  since home only once or twice.  He is certain, only taking the K+ when he takes furosemide.  At the time of his visit he was in [place] with ect[place], his amio continued in effort to avoid further Afib during his recovery, with plans to f/u in a few weeks to revisit stopping.  He continued with some orthostatic symptoms and his midodrine continued as well asked to monitor his BP at home.  TODAY He is doing well.  He denies any palpitations, no CP, SOB.  He denies dizzy spells, no near syncope or syncope, no symptoms of orthostatic dizziness.  No bleeding or signs of bleeding  He reports BPs generally 120's, some have been as high as 150's, though infrequently, he denies any BP readings <100  Past Medical History:  Diagnosis Date   Anemia  Arthritis    Asthma    CAD (coronary artery disease)    a. S/P PCI mid LAD (vision stent) 08/31/2004;  b. repeat cath 6/11 - no progression of CAD. c. Cath 10/2012 given moderate CAD on cardiac CT - no change from prior cath.   Chest pain 12/16/2016   COPD (chronic obstructive pulmonary disease) ([place])    COVID-19 virus infection    Diverticulitis    DJD (degenerative joint disease) of lumbar spine    Dyslipidemia    Gallstones    GERD (gastroesophageal reflux disease)    History of kidney stones    Hypertension    Hyperthyroidism    Melanoma ([place])    melanoma removed from neck    On home oxygen therapy    "2L q hs" (11/08/2012),  06/04/19 pt. not on oxygen   OSA (obstructive sleep apnea)    a. cpap noncompliance- patient reports on 12/10/14- does not use CPAP   Palpitations    a. 10/2012 - s/p LINQ loop recorder to assess for arrhythmia.    Pancreatitis    Paroxysmal A-fib ([place])    a. S/P PVI 2/11 and 9/11   PICC (peripherally inserted central catheter) in place    Pneumonia    SVT (supraventricular tachycardia) ([place])    a. Atypical AVNRT of the slow pathway   Type II diabetes mellitus [facility])     Past Surgical  History:  Procedure Laterality Date   ATRIAL ABLATION SURGERY  2007   S/P slow pathway ablation for atypical AVNRT    BIOPSY  07/17/2018   Procedure: BIOPSY;  Surgeon: Irving Copas., MD;  Location[facility] ENDOSCOPY;  Service: Gastroenterology;;   CHOLECYSTECTOMY  ~ 2008   COLON RESECTION  02/2013    COLONOSCOPY  12/25/2007   Colonic polyp status post polypectomy. Internal hemorrhoids.    CORONARY ANGIOPLASTY WITH STENT PLACEMENT  08/30/2004   Vision; to LAD    CYSTOSCOPY W/ STONE MANIPULATION  ~ 1998   "once" (11/08/2012)   EP Study  2008   Negative EP study in [place]   ESOPHAGOGASTRODUODENOSCOPY (EGD) WITH PROPOFOL N/A 07/17/2018   Procedure: ESOPHAGOGASTRODUODENOSCOPY (EGD) WITH PROPOFOL;  Surgeon: Irving Copas., MD;  Location: [place];  Service: Gastroenterology;  Laterality: N/A;   FRACTURE SURGERY     LEFT HEART CATHETERIZATION WITH CORONARY ANGIOGRAM N/A 11/10/2012   Procedure: LEFT HEART CATHETERIZATION WITH CORONARY ANGIOGRAM;  Surgeon: Burnell Blanks, MD;  Location: [facility] CATH LAB;  Service: Cardiovascular;  Laterality: N/A;   LOOP RECORDER IMPLANT N/A 11/10/2012   Medtronic LinQ implanted by Dr Rayann Heman for afib management   LUMBAR [place]  2000; 2002   POSTERIOR LUMBAR FUSION  2004   PROSTATE SURGERY  ~ 2009   PVI/ CTI ablation  02/25/2009 and 09/2009   S/P afib/ CTI ablation   SPLENECTOMY, TOTAL N/A 06/07/2019   Procedure: SPLENECTOMY;  Surgeon: Stark Klein, MD;  Location: [place];  Service: General;  Laterality: N/A;   [place]   "broke in 3 places; had rod put in" (11/08/2012)   [place] Right ~ [place] Right 12/18/2014   Procedure: RIGHT TOTAL HIP ARTHROPLASTY ANTERIOR APPROACH;  Surgeon: Gaynelle Arabian, MD;  Location: [facility];  Service: Orthopedics;  Laterality: Right;   UPPER ESOPHAGEAL ENDOSCOPIC ULTRASOUND (EUS) N/A 07/17/2018    Procedure: UPPER ESOPHAGEAL ENDOSCOPIC ULTRASOUND (EUS);  Surgeon: Rush Landmark Telford Nab., MD;  Location: [place];  Service: Gastroenterology;  Laterality: N/A;    Current Outpatient Medications  Medication Sig Dispense Refill   acetaminophen (TYLENOL) 325 MG tablet Take 650 mg by mouth every 6 (six) hours as needed for headache.      albuterol (VENTOLIN HFA) 108 (90 Base) MCG/ACT inhaler Inhale 2 puffs into the lungs every 4 (four) hours as needed for wheezing or shortness of breath.      amiodarone (PACERONE) 200 MG tablet Take 1 tablet (200 mg total) by mouth daily. 90 tablet 3   cholecalciferol (VITAMIN D3) 25 MCG (1000 UNIT) tablet Take 1,000 Units by mouth daily.     Cyanocobalamin (VITAMIN B-12 IJ) Inject 1,000 mcg as directed every 30 (thirty) days.     docusate sodium (COLACE) 100 MG capsule Take 100 mg by mouth See admin instructions. Three times a week     ELIQUIS 5 MG TABS tablet TAKE 1 TABLET BY MOUTH TWICE A DAY 180 tablet 2   escitalopram (LEXAPRO) 10 MG tablet Take 10 mg by mouth daily.  1   fenofibrate 160 MG tablet Take 160 mg by mouth daily.       ferrous sulfate 325 (65 FE) MG tablet Take 325 mg by mouth See admin instructions. Take 1 tablet 2 times a week     furosemide (LASIX) 20 MG tablet Take 40 mg by mouth daily.     gabapentin (NEURONTIN) 300 MG capsule Take 300 mg by mouth 2 (two) times daily.      ibuprofen (ADVIL) 200 MG tablet Take 400 mg by mouth every 6 (six) hours as needed.     Insulin Pen Needle 29G X 5MM MISC 1 Device by Does not apply route 3 (three) times daily as needed (for use with insulin pen). 100 each 0   KLOR-CON M10 10 MEQ tablet Take 10 mEq by mouth daily.     Lactobacillus (FLORAJEN ACIDOPHILUS PO) Take 1 capsule by mouth daily.     Melatonin 10 MG TABS Take 10 mg by mouth at bedtime.      metFORMIN (GLUCOPHAGE) 500 MG tablet Take 500 mg by mouth 2 (two) times daily.     metoprolol tartrate (LOPRESSOR) 25 MG tablet TAKE  0.5 TABLETS (12.5 MG TOTAL) BY MOUTH 2 (TWO) TIMES DAILY. 90 tablet 1   midodrine (PROAMATINE) 10 MG tablet Take 1 tablet (10 mg total) by mouth 3 (three) times daily with meals. 270 tablet 3   naloxone (NARCAN) 4 MG/0.1ML LIQD nasal spray kit Place 1 spray into the nose daily as needed (opioid overdose).     nitroGLYCERIN (NITROSTAT) 0.4 MG SL tablet Place 1 tablet (0.4 mg total) under the tongue every 5 (five) minutes x 3 doses as needed for chest pain. 25 tablet 1   ondansetron (ZOFRAN ODT) 4 MG disintegrating tablet Take 1 tablet (4 mg total) by mouth every 6 (six) hours as needed for nausea or vomiting. Place under tongue 30 tablet 1   OVER THE COUNTER MEDICATION Apply 1 application topically daily as needed (rash). OTC fanny cream     oxyCODONE 10 MG TABS Take 1 tablet (10 mg total) by mouth every 4 (four) hours as needed for severe pain. 42 tablet 0   oxyCODONE-acetaminophen (PERCOCET) 10-325 MG tablet Take 1 tablet by mouth every 4 (four) hours.      pantoprazole (PROTONIX) 40 MG tablet TAKE 1 TABLET BY MOUTH TWICE A DAY 180 tablet 1   promethazine (PHENERGAN) 25 MG tablet Take 1 tablet (25 mg total) by  mouth every 8 (eight) hours as needed for nausea or vomiting. 30 tablet 2   Tamsulosin HCl (FLOMAX) 0.4 MG CAPS Take 0.4 mg by mouth daily.       tiZANidine (ZANAFLEX) 4 MG tablet Take 4 mg by mouth 2 (two) times daily.      traZODone (DESYREL) 50 MG tablet Take 100 mg by mouth at bedtime.      No current facility-administered medications for this visit.    Allergies:   Penicillins   Social History:  The patient  reports that he quit smoking about 15 years ago. His smoking use included cigarettes. He has a 114.00 pack-year smoking history. He quit smokeless tobacco use about 15 years ago.  His smokeless tobacco use included chew. He reports previous alcohol use. He reports that he does not use drugs.   Family History:  The patient's family history includes Diabetes in his  father; Emphysema in his maternal grandfather and mother; Heart attack in his father; Heart disease in his father and paternal grandfather; Hypertension in his father.  ROS:  Please see the history of present illness.   All other systems are reviewed and otherwise negative.   PHYSICAL EXAM:  VS:  BP 118/62    Pulse 70    Ht 6' (1.829 m)    Wt 213 lb (96.6 kg)    SpO2 93%    BMI 28.89 kg/m  BMI: Body mass index is 28.89 kg/m. Well nourished, well developed, in no acute distress, appears chronically ill, older then his age 66: normocephalic, atraumatic  Neck: no JVD, carotid bruits or masses Cardiac:  RRR; has some extrasystoles, no significant murmurs, no rubs, or gallops Lungs:  CTA b/l, no wheezing, rhonchi or rales  Abd: abd is soft and nontender to light palpation MS: no deformity, advanced atrophy Ext: he has no pitting edema, he has large varicose veins RLE especially with some brawny type, minimal edema L>R Skin: warm and dry, no rash, not diaphoretic Neuro:  No gross deficits appreciated Psych: euthymic mood, full affect   EKG:  Done today and reviewed by myself SR 63bpm  12/17/2016: TTE Study Conclusions  - Left ventricle: The cavity size was normal. Wall thickness was  increased in a pattern of mild LVH. Systolic function was normal.  The estimated ejection fraction was in the range of 55% to 60%.  Wall motion was normal; there were no regional wall motion  abnormalities. Left ventricular diastolic function parameters  were normal.  - Left atrium: The atrium was mildly dilated.    11/10/2012: [facility] Angiographic Findings:  Left main: Aneurysmal segment at distal portion of vessel. Unchanged from last cath. No obstructive disease.   Left Anterior Descending Artery: Large caliber vessel that courses to the apex. There are two small caliber diagonal branches. The proximal segment of the LAD was aneurysmal. The mid LAD just beyond the diagonal branch has a  focal 50% stenosis, unchanged from last cath in 2011. The stent in the mid LAD is patent without restenosis. The distal vessel has mild plaque disease.   Circumflex Artery: Large caliber vessel with aneurysmal proximal segment. The first obtuse marginal branch is bifurcating vessel with mild plaque disease. The distal AV groove Circumflex has diffuse 30% stenosis.   Right Coronary Artery: Large dominant vessel with proximal 30% stenosis, serial 20% stenoses in the mid and distal vessel.   Left Ventricular Angiogram: Deferred.   Impression: 1. Stable single vessel CAD with patent stent mid LAD, stable moderate stenosis  mid LAD   Recent Labs: 06/04/2019: ALT 10 06/13/2019: TSH 1.776 06/18/2019: BUN 10; Creatinine, Ser 0.91; Hemoglobin 9.3; Magnesium 1.8; Platelets 482; Potassium 4.3; Sodium 137  02/20/2019: Cholesterol 113; HDL 20; LDL Cholesterol 38; Total CHOL/HDL Ratio 5.7; Triglycerides 276; VLDL 55   CrCl cannot be calculated (Patient's most recent lab result is older than the maximum 21 days allowed.).   Wt Readings from Last 3 Encounters:  08/01/19 213 lb (96.6 kg)  07/02/19 200 lb (90.7 kg)  06/14/19 196 lb 13.9 oz (89.3 kg)     Other studies reviewed: Additional studies/records reviewed today include: summarized above    ASSESSMENT AND PLAN:  1. Paroxysmal AFib     s/p CTI/PVI ablation 02/2009 and 09/2009 (AVNRT slow pathway ablated 2007)     CHA2DS2Vasc is 4, on Eliquis,      No palpitations, no ectopy on auscultation, none on his EKG     Will stop the amiodarone   2. CAD     No anginal symptoms     Remains with excellent exertional capacity, his pre-op DUKE score with >8METS     No ASA with Eliquis, on BB, and fenofibrate     Labs monitored with his PMD  3. HTN     with relative hypotension, infrequent and fleeting orthostatic symptoms     On midodrine post-op     He reports generally today his BP at home 120's occassionally higher towards 150     Orthostatics  today are negative  Supine 124/83, HR 62 Sitting 120/84, 67 Standing 122/81, 73 3 minutes 123/84, 70  No symptoms  At his last in patient cardiology note they mention "Defer furosemide dose changes to primary/medicine team as this appears to be more for ascites/portal hypertension"   Will go ahead and reduce his midodrine to 58m TID and he will follow up with his PMD for further management of this He has been taking the furosemide/K+ infrequently for infrequent mild LE swelling.  I have asked him to make sure he follows up with his PMD He will continue to monitor his BP closely at home He has follow up with his PMD and surgeon in a month   Disposition: f/u in 3 mo off amiodarone, sooner if needed     Current medicines are reviewed at length with the patient today.  The patient did not have any concerns regarding medicines  Signed, RTommye Standard PA-C 08/01/2019 8:46 AM     [facility] [address] [place] [phone])  [phone](fax)

## 2019-08-01 ENCOUNTER — Ambulatory Visit: Payer: Medicare PPO | Admitting: Physician Assistant

## 2019-08-01 ENCOUNTER — Other Ambulatory Visit: Payer: Self-pay

## 2019-08-01 VITALS — BP 118/62 | HR 70 | Ht 72.0 in | Wt 213.0 lb

## 2019-08-01 DIAGNOSIS — Z79899 Other long term (current) drug therapy: Secondary | ICD-10-CM | POA: Diagnosis not present

## 2019-08-01 DIAGNOSIS — I480 Paroxysmal atrial fibrillation: Secondary | ICD-10-CM

## 2019-08-01 DIAGNOSIS — I10 Essential (primary) hypertension: Secondary | ICD-10-CM

## 2019-08-01 DIAGNOSIS — I9589 Other hypotension: Secondary | ICD-10-CM

## 2019-08-01 DIAGNOSIS — I2510 Atherosclerotic heart disease of native coronary artery without angina pectoris: Secondary | ICD-10-CM

## 2019-08-01 MED ORDER — MIDODRINE HCL 10 MG PO TABS: 5.0000 mg | ORAL_TABLET | Freq: Three times a day (TID) | ORAL | 3 refills | Status: DC

## 2019-08-01 NOTE — Patient Instructions (Addendum)
Medication Instructions:   STOP TAKING AMIODARONE   START TAKING  MIDODRINE 5 MG THREE TIMES A DAY   *If you need a refill on your cardiac medications before your next appointment, please call your pharmacy*   Lab Work: NONE ORDERED  TODAY   If you have labs (blood work) drawn today and your tests are completely normal, you will receive your results only by: Marland Kitchen MyChart Message (if you have MyChart) OR . A paper copy in the mail If you have any lab test that is abnormal or we need to change your treatment, we will call you to review the results.   Testing/Procedures: NONE ORDERED  TODAY   Follow-Up: At [facility], you and your health needs are our priority.  As part of our continuing mission to provide you with exceptional heart care, we have created designated Provider Care Teams.  These Care Teams include your primary Cardiologist (physician) and Advanced Practice Providers (APPs -  Physician Assistants and Nurse Practitioners) who all work together to provide you with the care you need, when you need it.  We recommend signing up for the patient portal called "MyChart".  Sign up information is provided on this After Visit Summary.  MyChart is used to connect with patients for Virtual Visits (Telemedicine).  Patients are able to view lab/test results, encounter notes, upcoming appointments, etc.  Non-urgent messages can be sent to your provider as well.   To learn more about what you can do with MyChart, go to [email or web address].    Your next appointment:   3 month(s)  The format for your next appointment:   In Person  Provider:   You may see Dr. Rayann Heman  or one of the following Advanced Practice Providers on your designated Care Team:    Chanetta Marshall, NP  Tommye Standard, PA-C  Legrand Como "[place]" [place], [place]    Other Instructions  Keep monitoring blooded  pressure and follow up with primaey care provider with blood pressure management

## 2019-08-07 ENCOUNTER — Telehealth: Payer: Self-pay | Admitting: Internal Medicine

## 2019-08-07 NOTE — Telephone Encounter (Signed)
Attempted to call patient back. Unable to LVM.

## 2019-08-07 NOTE — Telephone Encounter (Signed)
°  Pt c/o BP issue: STAT if pt c/o blurred vision, one-sided weakness or slurred speech  1. What are your last 5 BP readings?   08/07/19  166/108  before meds 08/07/19   137/93   After meds  08/06/19   147/107  Before meds 08/06/19   136/92    After meds 08/06/19   158/101  at lunch 08/06/19   138/89    after meds 08/06/19   161/100  at bedtime  2. Are you having any other symptoms (ex. Dizziness, headache, blurred vision, passed out)? No symptoms  3. What is your BP issue? Patient was told at last visit to cut his BP medication in half but he has been taking the whole pill because his BP is running high. When he took the half pill his bp was 138/89 and 154/96. He is taking meds 3 x day. Patient feels he should continue to take the whole pill.

## 2019-08-09 ENCOUNTER — Telehealth: Payer: Self-pay | Admitting: Physician Assistant

## 2019-08-09 NOTE — Telephone Encounter (Signed)
Ft message on daughter's VM returning her Dad's call since I am unable to reach him to clarify his medicine questions and blood pressure concerns.  Advised to cal the office.    Tommye Standard, PA-C

## 2019-08-09 NOTE — Telephone Encounter (Signed)
Called patient regarding his BP reports to clarify what medicine the phone notes are discussing. I tried his number with no answer and his daughter's that I got a VM for, did not leave a message.  I reduced his midodrine at his last visit because his BP was better and he was goingto follow up with his PMD.  If his BP are high like he is reporting and he is back on midodrine 58m TID, he should reduce to 518mBID and continue to monitor. If his BP remains >130/80 to let usKoreanow  Our goal would be to have him eventually off this medicine if his BP is no longer low like it had been before his surgery.  ReTommye StandardPA-C

## 2019-08-09 NOTE — Telephone Encounter (Signed)
Follow up ° ° °Patient is returning your call. Please call. ° ° ° °

## 2019-08-10 NOTE — Telephone Encounter (Signed)
Returned call to pt.  Advised per Dr. Lovena Le  Do NOT take midodrine if systolic blood pressure is 120 or greater  Pt indicates understanding.

## 2019-08-11 ENCOUNTER — Telehealth: Payer: Self-pay | Admitting: Physician Assistant

## 2019-08-11 NOTE — Telephone Encounter (Signed)
Called to follow up on BP. He is OFF midodrine completely and taking metoprolol 28m BID and his BP is much improved. after his AM medicines his BP 130's/80'stypically, some 140's/90 range.  He will keep an eye on it and if routinely >140/90 will let uKoreaknow.  I will ask my MA to update his medicine list.  RTommye Standard PA-C

## 2019-09-27 ENCOUNTER — Telehealth: Payer: Self-pay | Admitting: Internal Medicine

## 2019-09-27 NOTE — Telephone Encounter (Signed)
Clinical pharmacist to review Eliquis

## 2019-09-27 NOTE — Telephone Encounter (Signed)
   Primary Cardiologist: Thompson Grayer, MD  Chart reviewed as part of pre-operative protocol coverage. Patient was contacted 09/27/2019 in reference to pre-operative risk assessment for pending surgery as outlined below.  Steve Anthony was last seen on 08/01/2019 by Tyson Babinski PA-C.  Since that day, Steve Anthony has done well without chest pain or significant dyspnea.  Therefore, based on ACC/AHA guidelines, the patient would be at acceptable risk for the planned procedure without further cardiovascular testing.   The patient was advised that if he develops new symptoms prior to surgery to contact our office to arrange for a follow-up visit, and he verbalized understanding.  I will route this recommendation to the requesting party via Epic fax function and remove from pre-op pool. Please call with questions.  He has been instructed to hold Eliquis for 3 days prior to the procedure and restart it as soon as possible after the procedure at the surgeon's discretion.   [place], [place] 09/27/2019, 4:29 PM

## 2019-09-27 NOTE — Telephone Encounter (Signed)
   [facility] Pre-operative Risk Assessment    HEARTCARE STAFF: - Please ensure there is not already an duplicate clearance open for this procedure. - Under Visit Info/Reason for Call, type in Other and utilize the format Clearance MM/DD/YY or Clearance TBD. Do not use dashes or single digits. - If request is for dental extraction, please clarify the # of teeth to be extracted.  Request for surgical clearance:  1. What type of surgery is being performed?  Steroid  Injection for Lumbar   2. When is this surgery scheduled? 11-21-19   3. What type of clearance is required (medical clearance vs. Pharmacy clearance to hold med vs. Both)? Medicine  4. Are there any medications that need to be held prior to surgery and how long? Eliquis   5. Practice name and name of physician performing surgery? [facility]  6. What is the office phone number? [phone]   7.   What is the office fax number? [phone]  8.   Anesthesia type (None, local, MAC, general)   no   Glyn Ade 09/27/2019, 9:08 AM  _________________________________________________________________   (provider comments below)

## 2019-09-27 NOTE — Telephone Encounter (Signed)
Patient with diagnosis of afib on Eliquis for anticoagulation.    Procedure: Steroid  Injection for Lumbar  Date of procedure: 11/21/19  CHADS2-VASc score of  4 (HTN, AGE, DM2,CAD)  CrCl 85 ml/min  Per office protocol, patient can hold Eliquis for 3 days prior to procedure.

## 2019-11-05 ENCOUNTER — Encounter: Payer: Self-pay | Admitting: Internal Medicine

## 2019-11-05 ENCOUNTER — Other Ambulatory Visit: Payer: Self-pay

## 2019-11-05 ENCOUNTER — Ambulatory Visit: Payer: Medicare PPO | Admitting: Internal Medicine

## 2019-11-05 VITALS — BP 124/78 | HR 61 | Ht 72.0 in | Wt 233.0 lb

## 2019-11-05 DIAGNOSIS — D6869 Other thrombophilia: Secondary | ICD-10-CM | POA: Diagnosis not present

## 2019-11-05 DIAGNOSIS — I2510 Atherosclerotic heart disease of native coronary artery without angina pectoris: Secondary | ICD-10-CM

## 2019-11-05 DIAGNOSIS — I480 Paroxysmal atrial fibrillation: Secondary | ICD-10-CM | POA: Diagnosis not present

## 2019-11-05 DIAGNOSIS — I10 Essential (primary) hypertension: Secondary | ICD-10-CM | POA: Diagnosis not present

## 2019-11-05 NOTE — Patient Instructions (Addendum)
Medication Instructions:  Your physician recommends that you continue on your current medications as directed. Please refer to the Current Medication list given to you today.  *If you need a refill on your cardiac medications before your next appointment, please call your pharmacy*  Lab Work: None ordered.  If you have labs (blood work) drawn today and your tests are completely normal, you will receive your results only by: Marland Kitchen MyChart Message (if you have MyChart) OR . A paper copy in the mail If you have any lab test that is abnormal or we need to change your treatment, we will call you to review the results.  Testing/Procedures: None ordered.  Follow-Up: At [facility], you and your health needs are our priority.  As part of our continuing mission to provide you with exceptional heart care, we have created designated Provider Care Teams.  These Care Teams include your primary Cardiologist (physician) and Advanced Practice Providers (APPs -  Physician Assistants and Nurse Practitioners) who all work together to provide you with the care you need, when you need it.  We recommend signing up for the patient portal called "MyChart".  Sign up information is provided on this After Visit Summary.  MyChart is used to connect with patients for Virtual Visits (Telemedicine).  Patients are able to view lab/test results, encounter notes, upcoming appointments, etc.  Non-urgent messages can be sent to your provider as well.   To learn more about what you can do with MyChart, go to [email or web address].    Your next appointment:   Your physician wants you to follow-up in: 6 months with Steve Anthony. 1 year with Steve Anthony. You will receive a reminder letter in the mail two months in advance. If you don't receive a letter, please call our office to schedule the follow-up appointment.   Other Instructions:

## 2019-11-05 NOTE — Progress Notes (Signed)
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  Since last being seen in our clinic, the patient reports doing very well.  No afib since last visit.  Today, he denies symptoms of palpitations, chest pain, shortness of breath,  lower extremity edema, dizziness, presyncope, or syncope.  The patient is otherwise without complaint today.   Past Medical History:  Diagnosis Date  . Anemia   . Arthritis   . Asthma   . CAD (coronary artery disease)    a. S/P PCI mid LAD (vision stent) 08/31/2004;  b. repeat cath 6/11 - no progression of CAD. c. Cath 10/2012 given moderate CAD on cardiac CT - no change from prior cath.  . Chest pain 12/16/2016  . COPD (chronic obstructive pulmonary disease) ([place])   . COVID-19 virus infection   . Diverticulitis   . DJD (degenerative joint disease) of lumbar spine   . Dyslipidemia   . Gallstones   . GERD (gastroesophageal reflux disease)   . History of kidney stones   . Hypertension   . Hyperthyroidism   . Melanoma ([place])    melanoma removed from neck   . On home oxygen therapy    "2L q hs" (11/08/2012),  06/04/19 pt. not on oxygen  . OSA (obstructive sleep apnea)    a. cpap noncompliance- patient reports on 12/10/14- does not use CPAP  . Palpitations    a. 10/2012 - s/p LINQ loop recorder to assess for arrhythmia.   . Pancreatitis   . Paroxysmal A-fib ([place])    a. S/P PVI 2/11 and 9/11  . PICC (peripherally inserted central catheter) in place   . Pneumonia   . SVT (supraventricular tachycardia) ([place])    a. Atypical AVNRT of the slow pathway  . Type II diabetes mellitus ([place])    Past Surgical History:  Procedure Laterality Date  . ATRIAL ABLATION SURGERY  2007   S/P slow pathway ablation for atypical AVNRT   . BIOPSY  07/17/2018   Procedure: BIOPSY;  Surgeon: Rush Landmark Telford Nab., MD;  Location: [facility];  Service: Gastroenterology;;  .  [facility]  ~ 2008  . COLON RESECTION  02/2013   . COLONOSCOPY  12/25/2007   Colonic polyp status post polypectomy. Internal hemorrhoids.   . CORONARY ANGIOPLASTY WITH STENT PLACEMENT  08/30/2004   Vision; to LAD   . CYSTOSCOPY W/ STONE MANIPULATION  ~ 1998   "once" (11/08/2012)  . EP Study  2008   Negative EP study in [place]  . ESOPHAGOGASTRODUODENOSCOPY (EGD) WITH PROPOFOL N/A 07/17/2018   Procedure: ESOPHAGOGASTRODUODENOSCOPY (EGD) WITH PROPOFOL;  Surgeon: Rush Landmark Telford Nab., MD;  Location: [facility];  Service: Gastroenterology;  Laterality: N/A;  . FRACTURE SURGERY    . LEFT HEART CATHETERIZATION WITH CORONARY ANGIOGRAM N/A 11/10/2012   Procedure: LEFT HEART CATHETERIZATION WITH CORONARY ANGIOGRAM;  Surgeon: Burnell Blanks, MD;  Location: [facility] CATH LAB;  Service: Cardiovascular;  Laterality: N/A;  . LOOP RECORDER IMPLANT N/A 11/10/2012   Medtronic LinQ implanted by Dr Rayann Heman for afib management  . [place] SURGERY  2000; 2002  . POSTERIOR LUMBAR FUSION  2004  . PROSTATE SURGERY  ~ 2009  . PVI/ CTI ablation  02/25/2009 and 09/2009   S/P afib/ CTI ablation  . SPLENECTOMY, TOTAL N/A 06/07/2019   Procedure: SPLENECTOMY;  Surgeon: Stark Klein, MD;  Location: [facility];  Service: General;  Laterality: N/A;  .  TIBIA FRACTURE SURGERY Right 1990   "broke in 3 places; had rod put in" (11/08/2012)  . TIBIA HARDWARE REMOVAL Right ~ 1991  . TONSILLECTOMY  1965  . TOTAL HIP ARTHROPLASTY Right 12/18/2014   Procedure: RIGHT TOTAL HIP ARTHROPLASTY ANTERIOR APPROACH;  Surgeon: Gaynelle Arabian, MD;  Location: [facility];  Service: Orthopedics;  Laterality: Right;  . UPPER ESOPHAGEAL ENDOSCOPIC ULTRASOUND (EUS) N/A 07/17/2018   Procedure: UPPER ESOPHAGEAL ENDOSCOPIC ULTRASOUND (EUS);  Surgeon: Irving Copas., MD;  Location: [facility];  Service: Gastroenterology;  Laterality: N/A;    ROS- all systems are reviewed and negatives except as per HPI above  Current Outpatient  Medications  Medication Sig Dispense Refill  . acetaminophen (TYLENOL) 325 MG tablet Take 650 mg by mouth every 6 (six) hours as needed for headache.     . albuterol (VENTOLIN HFA) 108 (90 Base) MCG/ACT inhaler Inhale 2 puffs into the lungs every 4 (four) hours as needed for wheezing or shortness of breath.     . cholecalciferol (VITAMIN D3) 25 MCG (1000 UNIT) tablet Take 1,000 Units by mouth daily.    . Cyanocobalamin (VITAMIN B-12 IJ) Inject 1,000 mcg as directed every 30 (thirty) days.    Marland Kitchen docusate sodium (COLACE) 100 MG capsule Take 100 mg by mouth See admin instructions. Three times a week    . ELIQUIS 5 MG TABS tablet TAKE 1 TABLET BY MOUTH TWICE A DAY 180 tablet 2  . escitalopram (LEXAPRO) 10 MG tablet Take 10 mg by mouth daily.  1  . fenofibrate 160 MG tablet Take 160 mg by mouth daily.      . ferrous sulfate 325 (65 FE) MG tablet Take 325 mg by mouth See admin instructions. Take 1 tablet 2 times a week    . furosemide (LASIX) 20 MG tablet Take 40 mg by mouth daily.    Marland Kitchen gabapentin (NEURONTIN) 300 MG capsule Take 300 mg by mouth 2 (two) times daily.     Marland Kitchen ibuprofen (ADVIL) 200 MG tablet Take 400 mg by mouth every 6 (six) hours as needed.    . Insulin Pen Needle 29G X 5MM MISC 1 Device by Does not apply route 3 (three) times daily as needed (for use with insulin pen). 100 each 0  . KLOR-CON M10 10 MEQ tablet Take 10 mEq by mouth daily.    . Lactobacillus (FLORAJEN ACIDOPHILUS PO) Take 1 capsule by mouth daily.    Marland Kitchen losartan (COZAAR) 50 MG tablet Take 1 tablet by mouth daily.    . Melatonin 10 MG TABS Take 10 mg by mouth at bedtime.     . metFORMIN (GLUCOPHAGE) 500 MG tablet Take 500 mg by mouth 2 (two) times daily.    . metoprolol tartrate (LOPRESSOR) 25 MG tablet TAKE 0.5 TABLETS (12.5 MG TOTAL) BY MOUTH 2 (TWO) TIMES DAILY. 90 tablet 1  . midodrine (PROAMATINE) 10 MG tablet Take 0.5 tablets (5 mg total) by mouth 3 (three) times daily with meals. 270 tablet 3  . naloxone (NARCAN) 4  MG/0.1ML LIQD nasal spray kit Place 1 spray into the nose daily as needed (opioid overdose).    . nitroGLYCERIN (NITROSTAT) 0.4 MG SL tablet Place 1 tablet (0.4 mg total) under the tongue every 5 (five) minutes x 3 doses as needed for chest pain. 25 tablet 1  . ondansetron (ZOFRAN ODT) 4 MG disintegrating tablet Take 1 tablet (4 mg total) by mouth every 6 (six) hours as needed for nausea or vomiting. Place under tongue 30  tablet 1  . OVER THE COUNTER MEDICATION Apply 1 application topically daily as needed (rash). OTC fanny cream    . oxyCODONE 10 MG TABS Take 1 tablet (10 mg total) by mouth every 4 (four) hours as needed for severe pain. 42 tablet 0  . oxyCODONE-acetaminophen (PERCOCET) 10-325 MG tablet Take 1 tablet by mouth every 4 (four) hours.     . pantoprazole (PROTONIX) 40 MG tablet TAKE 1 TABLET BY MOUTH TWICE A DAY 180 tablet 1  . promethazine (PHENERGAN) 25 MG tablet Take 1 tablet (25 mg total) by mouth every 8 (eight) hours as needed for nausea or vomiting. 30 tablet 2  . Tamsulosin HCl (FLOMAX) 0.4 MG CAPS Take 0.4 mg by mouth daily.      Marland Kitchen tiZANidine (ZANAFLEX) 4 MG tablet Take 4 mg by mouth 2 (two) times daily.     . traZODone (DESYREL) 50 MG tablet Take 100 mg by mouth at bedtime.     . montelukast (SINGULAIR) 10 MG tablet Take 1 tablet by mouth daily.     No current facility-administered medications for this visit.    Physical Exam: Vitals:   11/05/19 1143  BP: 124/78  Pulse: 61  SpO2: 91%  Weight: 233 lb (105.7 kg)  Height: 6' (1.829 m)    GEN- The patient is well appearing, alert and oriented x 3 today.   Head- normocephalic, atraumatic Eyes-  Sclera clear, conjunctiva pink Ears- hearing intact Oropharynx- clear Lungs- Clear to ausculation bilaterally, normal work of breathing Heart- Regular rate and rhythm, no murmurs, rubs or gallops, PMI not laterally displaced GI- soft, NT, ND, + BS Extremities- no clubbing, cyanosis, or edema  Wt Readings from Last 3  Encounters:  11/05/19 233 lb (105.7 kg)  08/01/19 213 lb (96.6 kg)  07/02/19 200 lb (90.7 kg)    EKG tracing ordered today is personally reviewed and shows sinus with PACs/PVCs  Assessment and Plan:  1. Paroxysmal atrial fibrillation Doing well s/p ablation 2011 Now off amiodarone chads2vasc score is 4.  Continue eliquis  2. HTN Now off midodrine Home BPs reviewed at length  3. CAD No ischemic symptoms S/ prior PCI of mid LAD  Risks, benefits and potential toxicities for medications prescribed and/or refilled reviewed with patient today.   Return to see Joseph Art in 6 months I will see in a year  Thompson Grayer MD, [facility] 11/05/2019 11:59 AM

## 2019-11-19 ENCOUNTER — Other Ambulatory Visit: Payer: Self-pay | Admitting: Gastroenterology

## 2019-11-28 DIAGNOSIS — Z87440 Personal history of urinary (tract) infections: Secondary | ICD-10-CM | POA: Insufficient documentation

## 2019-11-28 HISTORY — DX: Personal history of urinary (tract) infections: Z87.440

## 2019-12-31 ENCOUNTER — Encounter (HOSPITAL_COMMUNITY): Payer: Self-pay | Admitting: Emergency Medicine

## 2019-12-31 ENCOUNTER — Observation Stay (HOSPITAL_COMMUNITY): Payer: Medicare PPO

## 2019-12-31 ENCOUNTER — Inpatient Hospital Stay (HOSPITAL_COMMUNITY)
Admission: EM | Admit: 2019-12-31 | Discharge: 2020-01-07 | DRG: 536 | Disposition: A | Discharge status: Skilled Nursing Facility | Payer: Medicare PPO | Attending: Internal Medicine | Admitting: Internal Medicine

## 2019-12-31 ENCOUNTER — Emergency Department (HOSPITAL_COMMUNITY): Payer: Medicare PPO

## 2019-12-31 ENCOUNTER — Other Ambulatory Visit: Payer: Self-pay

## 2019-12-31 DIAGNOSIS — K8500 Idiopathic acute pancreatitis without necrosis or infection: Secondary | ICD-10-CM | POA: Diagnosis present

## 2019-12-31 DIAGNOSIS — Z825 Family history of asthma and other chronic lower respiratory diseases: Secondary | ICD-10-CM

## 2019-12-31 DIAGNOSIS — G629 Polyneuropathy, unspecified: Secondary | ICD-10-CM | POA: Diagnosis present

## 2019-12-31 DIAGNOSIS — I129 Hypertensive chronic kidney disease with stage 1 through stage 4 chronic kidney disease, or unspecified chronic kidney disease: Secondary | ICD-10-CM | POA: Diagnosis present

## 2019-12-31 DIAGNOSIS — I4819 Other persistent atrial fibrillation: Secondary | ICD-10-CM | POA: Diagnosis present

## 2019-12-31 DIAGNOSIS — L304 Erythema intertrigo: Secondary | ICD-10-CM | POA: Diagnosis present

## 2019-12-31 DIAGNOSIS — Z87891 Personal history of nicotine dependence: Secondary | ICD-10-CM

## 2019-12-31 DIAGNOSIS — Z8781 Personal history of (healed) traumatic fracture: Secondary | ICD-10-CM

## 2019-12-31 DIAGNOSIS — Z90411 Acquired partial absence of pancreas: Secondary | ICD-10-CM

## 2019-12-31 DIAGNOSIS — G894 Chronic pain syndrome: Secondary | ICD-10-CM | POA: Diagnosis present

## 2019-12-31 DIAGNOSIS — S329XXA Fracture of unspecified parts of lumbosacral spine and pelvis, initial encounter for closed fracture: Secondary | ICD-10-CM | POA: Diagnosis present

## 2019-12-31 DIAGNOSIS — Z981 Arthrodesis status: Secondary | ICD-10-CM

## 2019-12-31 DIAGNOSIS — Z20822 Contact with and (suspected) exposure to covid-19: Secondary | ICD-10-CM | POA: Diagnosis present

## 2019-12-31 DIAGNOSIS — E1122 Type 2 diabetes mellitus with diabetic chronic kidney disease: Secondary | ICD-10-CM | POA: Diagnosis present

## 2019-12-31 DIAGNOSIS — Z87442 Personal history of urinary calculi: Secondary | ICD-10-CM

## 2019-12-31 DIAGNOSIS — B961 Klebsiella pneumoniae [K. pneumoniae] as the cause of diseases classified elsewhere: Secondary | ICD-10-CM | POA: Diagnosis not present

## 2019-12-31 DIAGNOSIS — I4821 Permanent atrial fibrillation: Secondary | ICD-10-CM | POA: Diagnosis present

## 2019-12-31 DIAGNOSIS — K7460 Unspecified cirrhosis of liver: Secondary | ICD-10-CM | POA: Diagnosis present

## 2019-12-31 DIAGNOSIS — Z9119 Patient's noncompliance with other medical treatment and regimen: Secondary | ICD-10-CM

## 2019-12-31 DIAGNOSIS — S3282XA Multiple fractures of pelvis without disruption of pelvic ring, initial encounter for closed fracture: Principal | ICD-10-CM | POA: Diagnosis present

## 2019-12-31 DIAGNOSIS — G4733 Obstructive sleep apnea (adult) (pediatric): Secondary | ICD-10-CM | POA: Diagnosis present

## 2019-12-31 DIAGNOSIS — I480 Paroxysmal atrial fibrillation: Secondary | ICD-10-CM | POA: Diagnosis present

## 2019-12-31 DIAGNOSIS — I2510 Atherosclerotic heart disease of native coronary artery without angina pectoris: Secondary | ICD-10-CM | POA: Diagnosis present

## 2019-12-31 DIAGNOSIS — N390 Urinary tract infection, site not specified: Secondary | ICD-10-CM | POA: Diagnosis not present

## 2019-12-31 DIAGNOSIS — Z7901 Long term (current) use of anticoagulants: Secondary | ICD-10-CM

## 2019-12-31 DIAGNOSIS — Z833 Family history of diabetes mellitus: Secondary | ICD-10-CM

## 2019-12-31 DIAGNOSIS — N179 Acute kidney failure, unspecified: Secondary | ICD-10-CM | POA: Diagnosis present

## 2019-12-31 DIAGNOSIS — W19XXXA Unspecified fall, initial encounter: Secondary | ICD-10-CM

## 2019-12-31 DIAGNOSIS — I4891 Unspecified atrial fibrillation: Secondary | ICD-10-CM | POA: Diagnosis present

## 2019-12-31 DIAGNOSIS — Z880 Allergy status to penicillin: Secondary | ICD-10-CM

## 2019-12-31 DIAGNOSIS — N183 Chronic kidney disease, stage 3 unspecified: Secondary | ICD-10-CM | POA: Diagnosis present

## 2019-12-31 DIAGNOSIS — K219 Gastro-esophageal reflux disease without esophagitis: Secondary | ICD-10-CM | POA: Diagnosis present

## 2019-12-31 DIAGNOSIS — Z955 Presence of coronary angioplasty implant and graft: Secondary | ICD-10-CM

## 2019-12-31 DIAGNOSIS — F32A Depression, unspecified: Secondary | ICD-10-CM | POA: Diagnosis present

## 2019-12-31 DIAGNOSIS — N1831 Chronic kidney disease, stage 3a: Secondary | ICD-10-CM | POA: Diagnosis present

## 2019-12-31 DIAGNOSIS — M25551 Pain in right hip: Secondary | ICD-10-CM | POA: Diagnosis not present

## 2019-12-31 DIAGNOSIS — I10 Essential (primary) hypertension: Secondary | ICD-10-CM | POA: Diagnosis present

## 2019-12-31 DIAGNOSIS — N400 Enlarged prostate without lower urinary tract symptoms: Secondary | ICD-10-CM | POA: Diagnosis present

## 2019-12-31 DIAGNOSIS — Z8616 Personal history of COVID-19: Secondary | ICD-10-CM

## 2019-12-31 DIAGNOSIS — S300XXA Contusion of lower back and pelvis, initial encounter: Secondary | ICD-10-CM | POA: Diagnosis present

## 2019-12-31 DIAGNOSIS — S32810A Multiple fractures of pelvis with stable disruption of pelvic ring, initial encounter for closed fracture: Secondary | ICD-10-CM

## 2019-12-31 DIAGNOSIS — Z79899 Other long term (current) drug therapy: Secondary | ICD-10-CM

## 2019-12-31 DIAGNOSIS — E785 Hyperlipidemia, unspecified: Secondary | ICD-10-CM | POA: Diagnosis present

## 2019-12-31 DIAGNOSIS — Z79891 Long term (current) use of opiate analgesic: Secondary | ICD-10-CM

## 2019-12-31 DIAGNOSIS — D649 Anemia, unspecified: Secondary | ICD-10-CM | POA: Diagnosis present

## 2019-12-31 DIAGNOSIS — Z794 Long term (current) use of insulin: Secondary | ICD-10-CM

## 2019-12-31 DIAGNOSIS — G4730 Sleep apnea, unspecified: Secondary | ICD-10-CM | POA: Diagnosis present

## 2019-12-31 DIAGNOSIS — Z1612 Extended spectrum beta lactamase (ESBL) resistance: Secondary | ICD-10-CM | POA: Diagnosis not present

## 2019-12-31 DIAGNOSIS — S32592A Other specified fracture of left pubis, initial encounter for closed fracture: Secondary | ICD-10-CM

## 2019-12-31 DIAGNOSIS — J449 Chronic obstructive pulmonary disease, unspecified: Secondary | ICD-10-CM | POA: Diagnosis present

## 2019-12-31 DIAGNOSIS — Z9081 Acquired absence of spleen: Secondary | ICD-10-CM

## 2019-12-31 DIAGNOSIS — Z9049 Acquired absence of other specified parts of digestive tract: Secondary | ICD-10-CM

## 2019-12-31 DIAGNOSIS — Z8701 Personal history of pneumonia (recurrent): Secondary | ICD-10-CM

## 2019-12-31 DIAGNOSIS — Z86010 Personal history of colonic polyps: Secondary | ICD-10-CM

## 2019-12-31 DIAGNOSIS — Z8249 Family history of ischemic heart disease and other diseases of the circulatory system: Secondary | ICD-10-CM

## 2019-12-31 DIAGNOSIS — Z96641 Presence of right artificial hip joint: Secondary | ICD-10-CM | POA: Diagnosis present

## 2019-12-31 DIAGNOSIS — Z85820 Personal history of malignant melanoma of skin: Secondary | ICD-10-CM

## 2019-12-31 DIAGNOSIS — E0590 Thyrotoxicosis, unspecified without thyrotoxic crisis or storm: Secondary | ICD-10-CM | POA: Diagnosis present

## 2019-12-31 DIAGNOSIS — I471 Supraventricular tachycardia: Secondary | ICD-10-CM | POA: Diagnosis present

## 2019-12-31 HISTORY — DX: Fracture of unspecified parts of lumbosacral spine and pelvis, initial encounter for closed fracture: S32.9XXA

## 2019-12-31 LAB — RESP PANEL BY RT-PCR (FLU A&B, COVID) ARPGX2
Influenza A by PCR: NEGATIVE
Influenza B by PCR: NEGATIVE
SARS Coronavirus 2 by RT PCR: NEGATIVE

## 2019-12-31 LAB — BASIC METABOLIC PANEL
Anion gap: 10 (ref 5–15)
BUN: 12 mg/dL (ref 8–23)
CO2: 24 mmol/L (ref 22–32)
Calcium: 8.5 mg/dL — ABNORMAL LOW (ref 8.9–10.3)
Chloride: 101 mmol/L (ref 98–111)
Creatinine, Ser: 1.44 mg/dL — ABNORMAL HIGH (ref 0.61–1.24)
GFR, Estimated: 53 mL/min — ABNORMAL LOW (ref >60)
Glucose, Bld: 164 mg/dL — ABNORMAL HIGH (ref 70–99)
Potassium: 4.5 mmol/L (ref 3.5–5.1)
Sodium: 135 mmol/L (ref 135–145)

## 2019-12-31 LAB — CBC
HCT: 39.8 % (ref 39.0–52.0)
Hemoglobin: 12.5 g/dL — ABNORMAL LOW (ref 13.0–17.0)
MCH: 28.9 pg (ref 26.0–34.0)
MCHC: 31.4 g/dL (ref 30.0–36.0)
MCV: 91.9 fL (ref 80.0–100.0)
Platelets: 324 10*3/uL (ref 150–400)
RBC: 4.33 MIL/uL (ref 4.22–5.81)
RDW: 14.6 % (ref 11.5–15.5)
WBC: 15.7 10*3/uL — ABNORMAL HIGH (ref 4.0–10.5)
nRBC: 0 % (ref 0.0–0.2)

## 2019-12-31 MED ORDER — ONDANSETRON HCL 4 MG/2ML IJ SOLN
4.0000 mg | Freq: Once | INTRAMUSCULAR | Status: AC
  Administered 2019-12-31: 4 mg via INTRAVENOUS
  Filled 2019-12-31: qty 2

## 2019-12-31 MED ORDER — HYDROMORPHONE HCL 1 MG/ML IJ SOLN
1.0000 mg | Freq: Once | INTRAMUSCULAR | Status: AC
  Administered 2019-12-31: 1 mg via INTRAVENOUS
  Filled 2019-12-31: qty 1

## 2019-12-31 NOTE — ED Provider Notes (Addendum)
[facility] EMERGENCY DEPARTMENT Provider Note   CSN: [id number] Arrival date & time: 12/31/19  1839     History Chief Complaint  Patient presents with  . Hip Injury    Steve Anthony is a 69 y.o. male.  Patient indicates working on tractor when it accidentally started to roll, and knocked him to ground. States tractor hit right hip laterally, and he landed on left hip. C/o pain to bilateral hips. Pain acute onset post incident, moderate-severe, constant, dull, non radiating. States hx chronic back pain, not acutely worse. Unsure if hit head, is on anticoag therapy, mild headache. No neck pain. States felt fine, at baseline, prior to accident. Denies saddle area or extremity numbness or weakness. Skin intact, no wounds. Denies other pain or injury.   The history is provided by the patient and the EMS personnel.       Past Medical History:  Diagnosis Date  . Anemia   . Arthritis   . Asthma   . CAD (coronary artery disease)    a. S/P PCI mid LAD (vision stent) 08/31/2004;  b. repeat cath 6/11 - no progression of CAD. c. Cath 10/2012 given moderate CAD on cardiac CT - no change from prior cath.  . Chest pain 12/16/2016  . COPD (chronic obstructive pulmonary disease) ([place])   . COVID-19 virus infection   . Diverticulitis   . DJD (degenerative joint disease) of lumbar spine   . Dyslipidemia   . Gallstones   . GERD (gastroesophageal reflux disease)   . History of kidney stones   . Hypertension   . Hyperthyroidism   . Melanoma ([place])    melanoma removed from neck   . On home oxygen therapy    "2L q hs" (11/08/2012),  06/04/19 pt. not on oxygen  . OSA (obstructive sleep apnea)    a. cpap noncompliance- patient reports on 12/10/14- does not use CPAP  . Palpitations    a. 10/2012 - s/p LINQ loop recorder to assess for arrhythmia.   . Pancreatitis   . Paroxysmal A-fib ([place])    a. S/P PVI 2/11 and 9/11  . PICC (peripherally inserted central catheter) in place   .  Pneumonia   . SVT (supraventricular tachycardia) ([place])    a. Atypical AVNRT of the slow pathway  . Type II diabetes mellitus [facility])     Patient Active Problem List   Diagnosis Date Noted  . Hypotension 06/13/2019  . Protein-calorie malnutrition, severe 06/12/2019  . Pancreatic pseudocyst 06/07/2019  . Infected pancreatic pseudocyst 06/07/2019  . Pseudocyst of pancreas 03/06/2019  . Acute pancreatitis 02/20/2019  . CKD (chronic kidney disease), stage III ([place]) 02/20/2019  . History of COVID-19 01/24/19 02/20/2019  . Abnormal CT of the abdomen 05/27/2018  . Recurrent pancreatitis 05/27/2018  . Idiopathic acute pancreatitis 04/14/2018  . Abnormal MRI of abdomen 04/14/2018  . Pancreatic cyst 04/14/2018  . Cirrhosis of liver without ascites ([place]) 04/14/2018  . Chest pain 12/16/2016  . Pure hypercholesterolemia   . S/P coronary artery stent placement   . Long term (current) use of anticoagulants [Z79.01] 06/10/2015  . OA (osteoarthritis) of hip 12/18/2014  . Coronary atherosclerosis 12/31/2009  . Sleep apnea 05/17/2009  . COPD without exacerbation ([place]) 05/15/2009  . SHORTNESS OF BREATH 03/19/2009  . Atrial fibrillation ([place]) 11/21/2008  . CHEST PAIN UNSPECIFIED 11/21/2008  . HYPERTRIGLYCERIDEMIA 10/15/2008  . DYSLIPIDEMIA 10/15/2008  . Essential hypertension 10/15/2008  . Unstable angina ([place]) 10/15/2008  . GERD 10/15/2008  .  NEPHROLITHIASIS 10/15/2008  . PALPITATIONS 10/15/2008    Past Surgical History:  Procedure Laterality Date  . ATRIAL ABLATION SURGERY  2007   S/P slow pathway ablation for atypical AVNRT   . BIOPSY  07/17/2018   Procedure: BIOPSY;  Surgeon: Rush Landmark Telford Nab., MD;  Location: [address];  Service: Gastroenterology;;  . [facility]in [facility]  ~ 2008  . COLON RESECTION  02/2013   . COLONOSCOPY  12/25/2007   Colonic polyp status post polypectomy. Internal hemorrhoids.   . CORONARY ANGIOPLASTY WITH STENT PLACEMENT  08/30/2004   Vision; to LAD   .  CYSTOSCOPY W/ STONE MANIPULATION  ~ 1998   "once" (11/08/2012)  . EP Study  2008   Negative EP study in [place]  . ESOPHAGOGASTRODUODENOSCOPY (EGD) WITH PROPOFOL N/A 07/17/2018   Procedure: ESOPHAGOGASTRODUODENOSCOPY (EGD) WITH PROPOFOL;  Surgeon: Rush Landmark Telford Nab., MD;  Location: [facility] [facility];  Service: Gastroenterology;  Laterality: N/A;  . FRACTURE SURGERY    . LEFT HEART CATHETERIZATION WITH CORONARY ANGIOGRAM N/A 11/10/2012   Procedure: LEFT HEART CATHETERIZATION WITH CORONARY ANGIOGRAM;  Surgeon: Burnell Blanks, MD;  Location: [facility] CATH LAB;  Service: Cardiovascular;  Laterality: N/A;  . LOOP RECORDER IMPLANT N/A 11/10/2012   Medtronic LinQ implanted by Dr Rayann Heman for afib management  . [place] SURGERY  2000; 2002  . POSTERIOR LUMBAR FUSION  2004  . PROSTATE SURGERY  ~ 2009  . PVI/ CTI ablation  02/25/2009 and 09/2009   S/P afib/ CTI ablation  . SPLENECTOMY, TOTAL N/A 06/07/2019   Procedure: SPLENECTOMY;  Surgeon: Stark Klein, MD;  Location: [place];  Service: General;  Laterality: N/A;  . [place]   "broke in 3 places; had rod put in" (11/08/2012)  . TIBIA HARDWARE REMOVAL Right ~ 1991  . TONSILLECTOMY  1965  . TOTAL HIP ARTHROPLASTY Right 12/18/2014   Procedure: RIGHT TOTAL HIP ARTHROPLASTY ANTERIOR APPROACH;  Surgeon: Gaynelle Arabian, MD;  Location: [facility];  Service: Orthopedics;  Laterality: Right;  . UPPER ESOPHAGEAL ENDOSCOPIC ULTRASOUND (EUS) N/A 07/17/2018   Procedure: UPPER ESOPHAGEAL ENDOSCOPIC ULTRASOUND (EUS);  Surgeon: Irving Copas., MD;  Location: [place];  Service: Gastroenterology;  Laterality: N/A;       Family History  Problem Relation Age of Onset  . Emphysema Mother   . Heart attack Father        CVA  . Hypertension Father   . Diabetes Father   . Heart disease Father        MI  . Emphysema Maternal Grandfather        Smoker  . Heart disease Paternal Grandfather   . Colon cancer Neg Hx   . Esophageal  cancer Neg Hx   . Inflammatory bowel disease Neg Hx   . Liver disease Neg Hx   . Pancreatic cancer Neg Hx   . Rectal cancer Neg Hx   . Stomach cancer Neg Hx     Social History   Tobacco Use  . Smoking status: Former Smoker    Packs/day: 3.00    Years: 38.00    Pack years: 114.00    Types: Cigarettes    Quit date: 03/25/2004    Years since quitting: 15.7  . Smokeless tobacco: Former Systems developer    Types: Chew    Quit date: 04/25/2004  Vaping Use  . Vaping Use: Never used  Substance Use Topics  . Alcohol use: Not Currently    Comment:  "quit drinking in 1989"  . Drug use: No  Home Medications Prior to Admission medications   Medication Sig Start Date End Date Taking? Authorizing Provider  acetaminophen (TYLENOL) 325 MG tablet Take 650 mg by mouth every 6 (six) hours as needed for headache.     [provider]  albuterol (VENTOLIN HFA) 108 (90 Base) MCG/ACT inhaler Inhale 2 puffs into the lungs every 4 (four) hours as needed for wheezing or shortness of breath.  01/28/19   [provider]  cholecalciferol (VITAMIN D3) 25 MCG (1000 UNIT) tablet Take 1,000 Units by mouth daily.    [provider]  Cyanocobalamin (VITAMIN B-12 IJ) Inject 1,000 mcg as directed every 30 (thirty) days.    [provider]  docusate sodium (COLACE) 100 MG capsule Take 100 mg by mouth See admin instructions. Three times a week    [provider]  ELIQUIS 5 MG TABS tablet TAKE 1 TABLET BY MOUTH TWICE A DAY 07/24/19   Allred, Jeneen Rinks, MD  escitalopram (LEXAPRO) 10 MG tablet Take 10 mg by mouth daily. 05/30/17   [provider]  fenofibrate 160 MG tablet Take 160 mg by mouth daily.      [provider]  ferrous sulfate 325 (65 FE) MG tablet Take 325 mg by mouth See admin instructions. Take 1 tablet 2 times a week    [provider]  furosemide (LASIX) 20 MG tablet Take 40 mg by mouth daily.    [provider]  gabapentin (NEURONTIN) 300 MG  capsule Take 300 mg by mouth 2 (two) times daily.     [provider]  ibuprofen (ADVIL) 200 MG tablet Take 400 mg by mouth every 6 (six) hours as needed.    [provider]  Insulin Pen Needle 29G X 5MM MISC 1 Device by Does not apply route 3 (three) times daily as needed (for use with insulin pen). 06/18/19   Donne Hazel, MD  KLOR-CON M10 10 MEQ tablet Take 10 mEq by mouth daily. 04/23/19   [provider]  Lactobacillus (FLORAJEN ACIDOPHILUS PO) Take 1 capsule by mouth daily.    [provider]  losartan (COZAAR) 50 MG tablet Take 1 tablet by mouth daily. 09/12/19   [provider]  Melatonin 10 MG TABS Take 10 mg by mouth at bedtime.     [provider]  metFORMIN (GLUCOPHAGE) 500 MG tablet Take 500 mg by mouth 2 (two) times daily.    [provider]  metoprolol tartrate (LOPRESSOR) 25 MG tablet TAKE 0.5 TABLETS (12.5 MG TOTAL) BY MOUTH 2 (TWO) TIMES DAILY. 07/23/19   Allred, Jeneen Rinks, MD  midodrine (PROAMATINE) 10 MG tablet Take 0.5 tablets (5 mg total) by mouth 3 (three) times daily with meals. 08/01/19   Baldwin Jamaica, PA-C  montelukast (SINGULAIR) 10 MG tablet Take 1 tablet by mouth daily.    [provider]  naloxone [facility]) 4 MG/0.1ML LIQD nasal spray kit Place 1 spray into the nose daily as needed (opioid overdose).    [provider]  nitroGLYCERIN (NITROSTAT) 0.4 MG SL tablet Place 1 tablet (0.4 mg total) under the tongue every 5 (five) minutes x 3 doses as needed for chest pain. 12/17/16   Barrett, Evelene Croon, PA-C  ondansetron (ZOFRAN ODT) 4 MG disintegrating tablet Take 1 tablet (4 mg total) by mouth every 6 (six) hours as needed for nausea or vomiting. Place under tongue 03/20/19   Kennedy-Smith, Patrecia Pour, NP  OVER THE COUNTER MEDICATION Apply 1 application topically daily as needed (rash). OTC  fanny cream    [provider]  oxyCODONE 10 MG TABS Take 1 tablet (10 mg total) by mouth every 4 (four)  hours as needed for severe pain. 06/18/19   Stark Klein, MD  oxyCODONE-acetaminophen (PERCOCET) 10-325 MG tablet Take 1 tablet by mouth every 4 (four) hours.     [provider]  pantoprazole (PROTONIX) 40 MG tablet TAKE 1 TABLET BY MOUTH TWICE(2) DAILY 11/19/19   Mansouraty, Telford Nab., MD  promethazine (PHENERGAN) 25 MG tablet Take 1 tablet (25 mg total) by mouth every 8 (eight) hours as needed for nausea or vomiting. 09/25/18   Jackquline Denmark, MD  Tamsulosin HCl (FLOMAX) 0.4 MG CAPS Take 0.4 mg by mouth daily.      [provider]  tiZANidine (ZANAFLEX) 4 MG tablet Take 4 mg by mouth 2 (two) times daily.  03/01/18   [provider]  traZODone (DESYREL) 50 MG tablet Take 100 mg by mouth at bedtime.  08/17/17   [provider]    Allergies    Penicillins  Review of Systems   Review of Systems  Constitutional: Negative for fever.  HENT: Negative for nosebleeds.   Eyes: Negative for visual disturbance.  Respiratory: Negative for shortness of breath.   Cardiovascular: Negative for chest pain.  Gastrointestinal: Negative for abdominal pain and vomiting.  Genitourinary: Negative for flank pain.  Musculoskeletal: Negative for neck pain.  Skin: Negative for wound.  Neurological: Negative for weakness and numbness.  Hematological:       +anticoag therapy.   Psychiatric/Behavioral: Negative for confusion.    Physical Exam Updated Vital Signs BP 113/75   Pulse 82   Temp 99 F (37.2 C) (Oral)   Resp (!) 22   Ht 1.829 m (6')   Wt 103.4 kg   SpO2 90%   BMI 30.92 kg/m   Physical Exam Vitals and nursing note reviewed.  Constitutional:      Appearance: Normal appearance. He is well-developed.  HENT:     Head: Atraumatic.     Nose: Nose normal.     Mouth/Throat:     Mouth: Mucous membranes are moist.     Pharynx: Oropharynx is clear.  Eyes:     General: No scleral icterus.    Conjunctiva/sclera: Conjunctivae normal.     Pupils: Pupils are equal,  round, and reactive to light.  Neck:     Trachea: No tracheal deviation.  Cardiovascular:     Rate and Rhythm: Normal rate and regular rhythm.     Pulses: Normal pulses.     Heart sounds: Normal heart sounds. No murmur heard.  No friction rub. No gallop.   Pulmonary:     Effort: Pulmonary effort is normal. No accessory muscle usage or respiratory distress.     Breath sounds: Normal breath sounds.  Chest:     Chest wall: No tenderness.  Abdominal:     General: Bowel sounds are normal. There is no distension.     Palpations: Abdomen is soft.     Tenderness: There is no abdominal tenderness. There is no guarding.     Comments: No abd contusion, bruising, or tenderness.   Genitourinary:    Comments: No cva tenderness. Normal external gu exam.  Musculoskeletal:        General: No swelling.     Cervical back: Normal range of motion and neck supple. No rigidity or tenderness.     Comments: Lumbar tenderness, otherwise, CTLS spine, non tender, aligned, no step off. Tenderness bilateral  hips, no deformity. Distal pulses palp bil. Pelvis grossly stable.    Skin:    General: Skin is warm and dry.     Findings: No rash.  Neurological:     Mental Status: He is alert.     Comments: Alert, speech clear. GCS 15. Motor intact bil, stre 5/5. Sensation intact bil.   Psychiatric:        Mood and Affect: Mood normal.     ED Results / Procedures / Treatments   Labs (all labs ordered are listed, but only abnormal results are displayed) Results for orders placed or performed during the hospital encounter of 12/31/19  CBC  Result Value Ref Range   WBC 15.7 (H) 4.0 - 10.5 K/uL   RBC 4.33 4.22 - 5.81 MIL/uL   Hemoglobin 12.5 (L) 13.0 - 17.0 g/dL   HCT 39.8 39 - 52 %   MCV 91.9 80.0 - 100.0 fL   MCH 28.9 26.0 - 34.0 pg   MCHC 31.4 30.0 - 36.0 g/dL   RDW 14.6 11.5 - 15.5 %   Platelets 324 150 - 400 K/uL   nRBC 0.0 0.0 - 0.2 %   DG Lumbar Spine Complete  Result Date: 12/31/2019 CLINICAL  DATA:  Hit by tractor EXAM: [place] 4+ VIEW COMPARISON:  02/22/2017 FINDINGS: Mild straightening of the lumbar spine. Posterior rods, fixating screws and interbody device at L5-S1. Small metallic fragment posterior to the lower spinal rods without change. Vertebral body heights are maintained. Aortic atherosclerosis. Mild degenerative changes at L2-L3. IMPRESSION: Postsurgical changes at L5-S1. No acute osseous abnormality. Electronically Signed   By: Donavan Foil M.D.   On: 12/31/2019 20:04   CT HEAD WO CONTRAST  Result Date: 12/31/2019 CLINICAL DATA:  Head trauma EXAM: CT HEAD WITHOUT CONTRAST TECHNIQUE: Contiguous axial images were obtained from the base of the skull through the vertex without intravenous contrast. COMPARISON:  CT brain 05/20/2017 FINDINGS: Brain: No acute territorial infarction, hemorrhage, or intracranial mass is visualized. There is mild atrophy. Stable ventricle size. Vascular: No hyperdense vessels.  Carotid vascular calcification. Skull: Normal. Negative for fracture or focal lesion. Sinuses/Orbits: No acute finding. Other: None IMPRESSION: 1. No CT evidence for acute intracranial abnormality. 2. Mild atrophy. Electronically Signed   By: Donavan Foil M.D.   On: 12/31/2019 20:18   DG HIP UNILAT W OR W/O PELVIS 2-3 VIEWS LEFT  Result Date: 12/31/2019 CLINICAL DATA:  Hit by tractor EXAM: DG HIP (WITH OR WITHOUT PELVIS) 2-3V LEFT COMPARISON:  12/19/2017, CT 06/15/2019 FINDINGS: Possible acute nondisplaced fracture involving the left inferior pubic ramus. Left femoral head projects in joint. No fracture or dislocation at the left hip. IMPRESSION: Possible acute nondisplaced fracture involving the left inferior pubic ramus. Electronically Signed   By: Donavan Foil M.D.   On: 12/31/2019 20:07   DG HIP UNILAT W OR W/O PELVIS 2-3 VIEWS RIGHT  Result Date: 12/31/2019 CLINICAL DATA:  Hit by tractor EXAM: DG HIP (WITH OR WITHOUT PELVIS) 2-3V RIGHT COMPARISON:  05/20/2017  FINDINGS: Surgical hardware in the lumbosacral spine. Pubic symphysis appears intact. Probable acute nondisplaced fracture involving the left inferior pubic ramus. Status post right hip arthroplasty with intact hardware and normal alignment. IMPRESSION: Probable acute nondisplaced fracture involving the left inferior pubic ramus. Right hip replacement without acute osseous abnormality. Electronically Signed   By: Donavan Foil M.D.   On: 12/31/2019 20:09    EKG None  Radiology DG Lumbar Spine Complete  Result Date: 12/31/2019 CLINICAL DATA:  Hit by tractor EXAM: LUMBAR SPINE - COMPLETE 4+ VIEW COMPARISON:  02/22/2017 FINDINGS: Mild straightening of the lumbar spine. Posterior rods, fixating screws and interbody device at L5-S1. Small metallic fragment posterior to the lower spinal rods without change. Vertebral body heights are maintained. Aortic atherosclerosis. Mild degenerative changes at L2-L3. IMPRESSION: Postsurgical changes at L5-S1. No acute osseous abnormality. Electronically Signed   By: Donavan Foil M.D.   On: 12/31/2019 20:04   CT HEAD WO CONTRAST  Result Date: 12/31/2019 CLINICAL DATA:  Head trauma EXAM: CT HEAD WITHOUT CONTRAST TECHNIQUE: Contiguous axial images were obtained from the base of the skull through the vertex without intravenous contrast. COMPARISON:  CT brain 05/20/2017 FINDINGS: Brain: No acute territorial infarction, hemorrhage, or intracranial mass is visualized. There is mild atrophy. Stable ventricle size. Vascular: No hyperdense vessels.  Carotid vascular calcification. Skull: Normal. Negative for fracture or focal lesion. Sinuses/Orbits: No acute finding. Other: None IMPRESSION: 1. No CT evidence for acute intracranial abnormality. 2. Mild atrophy. Electronically Signed   By: Donavan Foil M.D.   On: 12/31/2019 20:18   DG HIP UNILAT W OR W/O PELVIS 2-3 VIEWS LEFT  Result Date: 12/31/2019 CLINICAL DATA:  Hit by tractor EXAM: DG HIP (WITH OR WITHOUT PELVIS) 2-3V LEFT  COMPARISON:  12/19/2017, CT 06/15/2019 FINDINGS: Possible acute nondisplaced fracture involving the left inferior pubic ramus. Left femoral head projects in joint. No fracture or dislocation at the left hip. IMPRESSION: Possible acute nondisplaced fracture involving the left inferior pubic ramus. Electronically Signed   By: Donavan Foil M.D.   On: 12/31/2019 20:07   DG HIP UNILAT W OR W/O PELVIS 2-3 VIEWS RIGHT  Result Date: 12/31/2019 CLINICAL DATA:  Hit by tractor EXAM: DG HIP (WITH OR WITHOUT PELVIS) 2-3V RIGHT COMPARISON:  05/20/2017 FINDINGS: Surgical hardware in the lumbosacral spine. Pubic symphysis appears intact. Probable acute nondisplaced fracture involving the left inferior pubic ramus. Status post right hip arthroplasty with intact hardware and normal alignment. IMPRESSION: Probable acute nondisplaced fracture involving the left inferior pubic ramus. Right hip replacement without acute osseous abnormality. Electronically Signed   By: Donavan Foil M.D.   On: 12/31/2019 20:09    Procedures Procedures (including critical care time)  Medications Ordered in ED Medications  HYDROmorphone (DILAUDID) injection 1 mg (1 mg Intravenous Given 12/31/19 1923)  ondansetron (ZOFRAN) injection 4 mg (4 mg Intravenous Given 12/31/19 1923)    ED Course  I have reviewed the triage vital signs and the nursing notes.  Pertinent labs & imaging results that were available during my care of the patient were reviewed by me and considered in my medical decision making (see chart for details).    MDM Rules/Calculators/A&P                          Imaging ordered.   Reviewed nursing notes and prior charts for additional history.   Dilaudid for pain.   Xrays reviewed/interpreted by me - left inferior pubic rami fx.   CT reviewed/interpreted by me - no hem.  Pain improved w meds. Will attempt to ambulate w assistance/walker.   Even after fentanyl by EMS, dilaudid x 2 in ED, pt unable to stand/walk  due to pain. Labs added and will consult medicine for admission, pain management, PT.  Labs reviewed/interpreted by me - hct normal.   Hospitalists consulted for admission. Discussed w Dr Hal Hope - will admit.    Final Clinical Impression(s) / ED Diagnoses Final diagnoses:  None    Rx / DC Orders ED Discharge Orders    None          Lajean Saver, MD 12/31/19 2315

## 2019-12-31 NOTE — ED Triage Notes (Addendum)
Pt was working on a tractor when it rolled and knocked him down.  Tire rolled up on his left hip but he was not entrapped.  Pt complains of bilateral hip pain. Pt was on the ground for about two hours before help arrived.  EMS was on scene for some time.  Pt has had 100 mcg of Fentanyl (last dose at 1650) and a total of 60 mg of Ketamine(last dose at 1750).  Last dose of 76m ws given around 1650. Pt is on Eliquis.

## 2020-01-01 ENCOUNTER — Encounter (HOSPITAL_COMMUNITY): Payer: Self-pay | Admitting: Internal Medicine

## 2020-01-01 DIAGNOSIS — I4891 Unspecified atrial fibrillation: Secondary | ICD-10-CM

## 2020-01-01 DIAGNOSIS — S3289XA Fracture of other parts of pelvis, initial encounter for closed fracture: Secondary | ICD-10-CM | POA: Diagnosis not present

## 2020-01-01 DIAGNOSIS — Z9081 Acquired absence of spleen: Secondary | ICD-10-CM | POA: Diagnosis not present

## 2020-01-01 DIAGNOSIS — I480 Paroxysmal atrial fibrillation: Secondary | ICD-10-CM | POA: Diagnosis present

## 2020-01-01 DIAGNOSIS — Z90411 Acquired partial absence of pancreas: Secondary | ICD-10-CM | POA: Diagnosis not present

## 2020-01-01 DIAGNOSIS — I10 Essential (primary) hypertension: Secondary | ICD-10-CM | POA: Diagnosis not present

## 2020-01-01 DIAGNOSIS — M25551 Pain in right hip: Secondary | ICD-10-CM | POA: Diagnosis present

## 2020-01-01 DIAGNOSIS — N400 Enlarged prostate without lower urinary tract symptoms: Secondary | ICD-10-CM | POA: Diagnosis present

## 2020-01-01 DIAGNOSIS — K8500 Idiopathic acute pancreatitis without necrosis or infection: Secondary | ICD-10-CM | POA: Diagnosis not present

## 2020-01-01 DIAGNOSIS — N183 Chronic kidney disease, stage 3 unspecified: Secondary | ICD-10-CM | POA: Diagnosis present

## 2020-01-01 DIAGNOSIS — K219 Gastro-esophageal reflux disease without esophagitis: Secondary | ICD-10-CM | POA: Diagnosis present

## 2020-01-01 DIAGNOSIS — Z20822 Contact with and (suspected) exposure to covid-19: Secondary | ICD-10-CM | POA: Diagnosis present

## 2020-01-01 DIAGNOSIS — L304 Erythema intertrigo: Secondary | ICD-10-CM | POA: Diagnosis present

## 2020-01-01 DIAGNOSIS — K7460 Unspecified cirrhosis of liver: Secondary | ICD-10-CM | POA: Diagnosis not present

## 2020-01-01 DIAGNOSIS — Z1612 Extended spectrum beta lactamase (ESBL) resistance: Secondary | ICD-10-CM | POA: Diagnosis not present

## 2020-01-01 DIAGNOSIS — F32A Depression, unspecified: Secondary | ICD-10-CM | POA: Diagnosis present

## 2020-01-01 DIAGNOSIS — D649 Anemia, unspecified: Secondary | ICD-10-CM | POA: Diagnosis present

## 2020-01-01 DIAGNOSIS — E785 Hyperlipidemia, unspecified: Secondary | ICD-10-CM | POA: Diagnosis present

## 2020-01-01 DIAGNOSIS — N179 Acute kidney failure, unspecified: Secondary | ICD-10-CM | POA: Diagnosis present

## 2020-01-01 DIAGNOSIS — S3282XA Multiple fractures of pelvis without disruption of pelvic ring, initial encounter for closed fracture: Secondary | ICD-10-CM | POA: Diagnosis present

## 2020-01-01 DIAGNOSIS — Z8781 Personal history of (healed) traumatic fracture: Secondary | ICD-10-CM

## 2020-01-01 DIAGNOSIS — I129 Hypertensive chronic kidney disease with stage 1 through stage 4 chronic kidney disease, or unspecified chronic kidney disease: Secondary | ICD-10-CM | POA: Diagnosis present

## 2020-01-01 DIAGNOSIS — G894 Chronic pain syndrome: Secondary | ICD-10-CM | POA: Diagnosis present

## 2020-01-01 DIAGNOSIS — I471 Supraventricular tachycardia: Secondary | ICD-10-CM | POA: Diagnosis present

## 2020-01-01 DIAGNOSIS — N390 Urinary tract infection, site not specified: Secondary | ICD-10-CM | POA: Diagnosis not present

## 2020-01-01 DIAGNOSIS — J449 Chronic obstructive pulmonary disease, unspecified: Secondary | ICD-10-CM | POA: Diagnosis not present

## 2020-01-01 DIAGNOSIS — B961 Klebsiella pneumoniae [K. pneumoniae] as the cause of diseases classified elsewhere: Secondary | ICD-10-CM | POA: Diagnosis not present

## 2020-01-01 DIAGNOSIS — I2510 Atherosclerotic heart disease of native coronary artery without angina pectoris: Secondary | ICD-10-CM | POA: Diagnosis present

## 2020-01-01 DIAGNOSIS — N1831 Chronic kidney disease, stage 3a: Secondary | ICD-10-CM | POA: Diagnosis not present

## 2020-01-01 DIAGNOSIS — S300XXA Contusion of lower back and pelvis, initial encounter: Secondary | ICD-10-CM | POA: Diagnosis present

## 2020-01-01 DIAGNOSIS — Z955 Presence of coronary angioplasty implant and graft: Secondary | ICD-10-CM | POA: Diagnosis not present

## 2020-01-01 HISTORY — DX: Personal history of (healed) traumatic fracture: Z87.81

## 2020-01-01 LAB — GLUCOSE, CAPILLARY
Glucose-Capillary: 124 mg/dL — ABNORMAL HIGH (ref 70–99)
Glucose-Capillary: 135 mg/dL — ABNORMAL HIGH (ref 70–99)
Glucose-Capillary: 182 mg/dL — ABNORMAL HIGH (ref 70–99)
Glucose-Capillary: 204 mg/dL — ABNORMAL HIGH (ref 70–99)
Glucose-Capillary: 245 mg/dL — ABNORMAL HIGH (ref 70–99)
Glucose-Capillary: 97 mg/dL (ref 70–99)

## 2020-01-01 LAB — COMPREHENSIVE METABOLIC PANEL
ALT: 14 U/L (ref 0–44)
AST: 20 U/L (ref 15–41)
Albumin: 2.8 g/dL — ABNORMAL LOW (ref 3.5–5.0)
Alkaline Phosphatase: 28 U/L — ABNORMAL LOW (ref 38–126)
Anion gap: 9 (ref 5–15)
BUN: 12 mg/dL (ref 8–23)
CO2: 23 mmol/L (ref 22–32)
Calcium: 8 mg/dL — ABNORMAL LOW (ref 8.9–10.3)
Chloride: 103 mmol/L (ref 98–111)
Creatinine, Ser: 1.32 mg/dL — ABNORMAL HIGH (ref 0.61–1.24)
GFR, Estimated: 59 mL/min — ABNORMAL LOW (ref >60)
Glucose, Bld: 118 mg/dL — ABNORMAL HIGH (ref 70–99)
Potassium: 4 mmol/L (ref 3.5–5.1)
Sodium: 135 mmol/L (ref 135–145)
Total Bilirubin: 0.8 mg/dL (ref 0.3–1.2)
Total Protein: 4.9 g/dL — ABNORMAL LOW (ref 6.5–8.1)

## 2020-01-01 LAB — CBC
HCT: 36.6 % — ABNORMAL LOW (ref 39.0–52.0)
HCT: 38.1 % — ABNORMAL LOW (ref 39.0–52.0)
Hemoglobin: 11.7 g/dL — ABNORMAL LOW (ref 13.0–17.0)
Hemoglobin: 12.2 g/dL — ABNORMAL LOW (ref 13.0–17.0)
MCH: 28.8 pg (ref 26.0–34.0)
MCH: 29.1 pg (ref 26.0–34.0)
MCHC: 32 g/dL (ref 30.0–36.0)
MCHC: 32 g/dL (ref 30.0–36.0)
MCV: 90.1 fL (ref 80.0–100.0)
MCV: 90.9 fL (ref 80.0–100.0)
Platelets: 285 10*3/uL (ref 150–400)
Platelets: 304 10*3/uL (ref 150–400)
RBC: 4.06 MIL/uL — ABNORMAL LOW (ref 4.22–5.81)
RBC: 4.19 MIL/uL — ABNORMAL LOW (ref 4.22–5.81)
RDW: 14.7 % (ref 11.5–15.5)
RDW: 14.8 % (ref 11.5–15.5)
WBC: 11.7 10*3/uL — ABNORMAL HIGH (ref 4.0–10.5)
WBC: 12.7 10*3/uL — ABNORMAL HIGH (ref 4.0–10.5)
nRBC: 0 % (ref 0.0–0.2)
nRBC: 0 % (ref 0.0–0.2)

## 2020-01-01 LAB — LACTIC ACID, PLASMA
Lactic Acid, Venous: 1 mmol/L (ref 0.5–1.9)
Lactic Acid, Venous: 0.7 mmol/L (ref 0.5–1.9)

## 2020-01-01 LAB — TYPE AND SCREEN
ABO/RH(D): AB POS
Antibody Screen: NEGATIVE

## 2020-01-01 LAB — HEMOGLOBIN A1C
Hgb A1c MFr Bld: 11.8 % — ABNORMAL HIGH (ref 4.8–5.6)
Mean Plasma Glucose: 291.96 mg/dL

## 2020-01-01 LAB — MRSA PCR SCREENING: MRSA by PCR: NEGATIVE

## 2020-01-01 MED ORDER — TAMSULOSIN HCL 0.4 MG PO CAPS
0.4000 mg | ORAL_CAPSULE | Freq: Every day | ORAL | Status: DC
  Administered 2020-01-02 – 2020-01-07 (×6): 0.4 mg via ORAL
  Filled 2020-01-01 (×6): qty 1

## 2020-01-01 MED ORDER — TIZANIDINE HCL 4 MG PO TABS
4.0000 mg | ORAL_TABLET | Freq: Two times a day (BID) | ORAL | Status: DC
  Administered 2020-01-01 – 2020-01-03 (×4): 4 mg via ORAL
  Filled 2020-01-01 (×4): qty 1

## 2020-01-01 MED ORDER — AMIODARONE HCL 200 MG PO TABS
200.0000 mg | ORAL_TABLET | Freq: Every day | ORAL | Status: DC
  Administered 2020-01-01 – 2020-01-07 (×7): 200 mg via ORAL
  Filled 2020-01-01 (×7): qty 1

## 2020-01-01 MED ORDER — MIDODRINE HCL 5 MG PO TABS: 5.0000 mg | ORAL_TABLET | Freq: Three times a day (TID) | ORAL | Status: DC

## 2020-01-01 MED ORDER — ACETAMINOPHEN 325 MG PO TABS
650.0000 mg | ORAL_TABLET | Freq: Four times a day (QID) | ORAL | Status: DC | PRN
  Administered 2020-01-01 – 2020-01-07 (×7): 650 mg via ORAL
  Filled 2020-01-01 (×7): qty 2

## 2020-01-01 MED ORDER — MORPHINE SULFATE (PF) 4 MG/ML IV SOLN
4.0000 mg | INTRAVENOUS | Status: DC | PRN
  Administered 2020-01-01 – 2020-01-03 (×6): 4 mg via INTRAVENOUS
  Filled 2020-01-01 (×6): qty 1

## 2020-01-01 MED ORDER — OXYCODONE HCL 5 MG PO TABS
5.0000 mg | ORAL_TABLET | ORAL | Status: DC | PRN
  Administered 2020-01-01 – 2020-01-02 (×5): 5 mg via ORAL
  Filled 2020-01-01 (×5): qty 1

## 2020-01-01 MED ORDER — SODIUM CHLORIDE 0.9 % IV SOLN: INTRAVENOUS | Status: AC

## 2020-01-01 MED ORDER — MELATONIN 5 MG PO TABS
10.0000 mg | ORAL_TABLET | Freq: Every day | ORAL | Status: DC
  Administered 2020-01-01 – 2020-01-06 (×6): 10 mg via ORAL
  Filled 2020-01-01 (×6): qty 2

## 2020-01-01 MED ORDER — ACETAMINOPHEN 650 MG RE SUPP: 650.0000 mg | Freq: Four times a day (QID) | RECTAL | Status: DC | PRN

## 2020-01-01 MED ORDER — PROCHLORPERAZINE EDISYLATE 10 MG/2ML IJ SOLN: 10.0000 mg | Freq: Four times a day (QID) | INTRAMUSCULAR | Status: DC | PRN

## 2020-01-01 MED ORDER — TRAZODONE HCL 100 MG PO TABS
100.0000 mg | ORAL_TABLET | Freq: Every day | ORAL | Status: DC
  Administered 2020-01-01 – 2020-01-06 (×6): 100 mg via ORAL
  Filled 2020-01-01 (×6): qty 1

## 2020-01-01 MED ORDER — ESCITALOPRAM OXALATE 10 MG PO TABS
10.0000 mg | ORAL_TABLET | Freq: Every day | ORAL | Status: DC
  Administered 2020-01-02 – 2020-01-07 (×6): 10 mg via ORAL
  Filled 2020-01-01 (×6): qty 1

## 2020-01-01 MED ORDER — ALBUTEROL SULFATE HFA 108 (90 BASE) MCG/ACT IN AERS
2.0000 | INHALATION_SPRAY | RESPIRATORY_TRACT | Status: DC | PRN
  Filled 2020-01-01: qty 6.7

## 2020-01-01 MED ORDER — INSULIN ASPART 100 UNIT/ML ~~LOC~~ SOLN
0.0000 [IU] | SUBCUTANEOUS | Status: DC
  Administered 2020-01-01: 3 [IU] via SUBCUTANEOUS
  Administered 2020-01-01: 1 [IU] via SUBCUTANEOUS
  Administered 2020-01-01: 3 [IU] via SUBCUTANEOUS
  Administered 2020-01-02: 5 [IU] via SUBCUTANEOUS
  Administered 2020-01-02: 1 [IU] via SUBCUTANEOUS
  Administered 2020-01-02 (×2): 2 [IU] via SUBCUTANEOUS

## 2020-01-01 MED ORDER — ONDANSETRON HCL 4 MG/2ML IJ SOLN
4.0000 mg | Freq: Once | INTRAMUSCULAR | Status: AC
  Administered 2020-01-01: 4 mg via INTRAVENOUS
  Filled 2020-01-01: qty 2

## 2020-01-01 MED ORDER — MORPHINE SULFATE (PF) 2 MG/ML IV SOLN
2.0000 mg | INTRAVENOUS | Status: DC | PRN
  Administered 2020-01-01 (×2): 2 mg via INTRAVENOUS
  Filled 2020-01-01 (×2): qty 1

## 2020-01-01 MED ORDER — LABETALOL HCL 5 MG/ML IV SOLN: 10.0000 mg | INTRAVENOUS | Status: DC | PRN

## 2020-01-01 MED ORDER — GABAPENTIN 300 MG PO CAPS
300.0000 mg | ORAL_CAPSULE | Freq: Two times a day (BID) | ORAL | Status: DC
  Administered 2020-01-01 – 2020-01-04 (×8): 300 mg via ORAL
  Filled 2020-01-01 (×9): qty 1

## 2020-01-01 MED ORDER — IPRATROPIUM-ALBUTEROL 0.5-2.5 (3) MG/3ML IN SOLN: 3.0000 mL | RESPIRATORY_TRACT | Status: DC | PRN

## 2020-01-01 MED ORDER — MONTELUKAST SODIUM 10 MG PO TABS
10.0000 mg | ORAL_TABLET | Freq: Every day | ORAL | Status: DC
  Administered 2020-01-02 – 2020-01-07 (×6): 10 mg via ORAL
  Filled 2020-01-01 (×6): qty 1

## 2020-01-01 MED ORDER — SODIUM CHLORIDE 0.9 % IV BOLUS
1000.0000 mL | Freq: Once | INTRAVENOUS | Status: AC
  Administered 2020-01-01: 1000 mL via INTRAVENOUS

## 2020-01-01 MED ORDER — FENOFIBRATE 160 MG PO TABS
160.0000 mg | ORAL_TABLET | Freq: Every day | ORAL | Status: DC
  Administered 2020-01-02 – 2020-01-07 (×6): 160 mg via ORAL
  Filled 2020-01-01 (×6): qty 1

## 2020-01-01 MED ORDER — PANTOPRAZOLE SODIUM 40 MG PO TBEC
40.0000 mg | DELAYED_RELEASE_TABLET | Freq: Two times a day (BID) | ORAL | Status: DC
  Administered 2020-01-01 – 2020-01-07 (×12): 40 mg via ORAL
  Filled 2020-01-01 (×12): qty 1

## 2020-01-01 MED ORDER — METOPROLOL TARTRATE 12.5 MG HALF TABLET
12.5000 mg | ORAL_TABLET | Freq: Two times a day (BID) | ORAL | Status: DC
  Administered 2020-01-01 – 2020-01-07 (×11): 12.5 mg via ORAL
  Filled 2020-01-01 (×12): qty 1

## 2020-01-01 NOTE — Progress Notes (Signed)
Patient seen and examined.  Admitted early morning hours by nighttime hospitalist.  69 year old gentleman with history of paroxysmal A. fib on Eliquis, coronary artery disease, idiopathic pancreatitis status post distal pancreatectomy and splenectomy 6 months ago, chronic pain syndrome on chronic opiates and gabapentin managed by pain clinic presented to the emergency room with trauma from home.  He was working on a tractor, tractor rolled on him and knocked him off.  He was found with multiple pelvic bone fractures, hematoma.  Plan: Orthopedic anticipating conservative management.  Resume all his home pain medications regiment.  Add IV morphine for pain relief. Therapies, will await orthopedic recommendations. Discontinue Eliquis for now. Continue to monitor hemoglobin. Anticipate SNF versus rehab.

## 2020-01-01 NOTE — Progress Notes (Signed)
Pt refusing cpap for the night. ?

## 2020-01-01 NOTE — Plan of Care (Signed)
  Problem: Education: Goal: Knowledge of General Education information will improve Description: Including pain rating scale, medication(s)/side effects and non-pharmacologic comfort measures Outcome: Progressing   Problem: Clinical Measurements: Goal: Ability to maintain clinical measurements within normal limits will improve Outcome: Progressing   Problem: Clinical Measurements: Goal: Diagnostic test results will improve Outcome: Progressing   Problem: Clinical Measurements: Goal: Respiratory complications will improve Outcome: Progressing

## 2020-01-01 NOTE — Consult Note (Signed)
ORTHOPAEDIC CONSULTATION  REQUESTING PHYSICIAN: Steve Patience, MD  Chief Complaint: Left pelvic rami fracture  HPI: Steve Anthony is a 69 y.o. male who complains of working on a tractor which accidentally started to roll and knocked him to the ground. The tractor hit the right hip causing him to strike  the left hip on the ground.   Steve Anthony is a 69 y.o. male.    Imaging shows Probable acute nondisplaced fracture involving the left inferior pubic ramus. Right hip replacement without acute osseous abnormality..  Orthopedics was consulted for evaluation.    Denies history of MI, CVA, DVT, PE.  Previously ambulatory.    Past Medical History:  Diagnosis Date  . Anemia   . Arthritis   . Asthma   . CAD (coronary artery disease)    a. S/P PCI mid LAD (vision stent) 08/31/2004;  b. repeat cath 6/11 - no progression of CAD. c. Cath 10/2012 given moderate CAD on cardiac CT - no change from prior cath.  . Chest pain 12/16/2016  . COPD (chronic obstructive pulmonary disease) ([place])   . COVID-19 virus infection   . Diverticulitis   . DJD (degenerative joint disease) of lumbar spine   . Dyslipidemia   . Gallstones   . GERD (gastroesophageal reflux disease)   . History of kidney stones   . Hypertension   . Hyperthyroidism   . Melanoma ([employer])    melanoma removed from neck   . On home oxygen therapy    "2L q hs" (11/08/2012),  06/04/19 pt. not on oxygen  . OSA (obstructive sleep apnea)    a. cpap noncompliance- patient reports on 12/10/14- does not use CPAP  . Palpitations    a. 10/2012 - s/p LINQ loop recorder to assess for arrhythmia.   . Pancreatitis   . Paroxysmal A-fib ([place])    a. S/P PVI 2/11 and 9/11  . PICC (peripherally inserted central catheter) in place   . Pneumonia   . SVT (supraventricular tachycardia) ([place])    a. Atypical AVNRT of the slow pathway  . Type II diabetes mellitus ([place])    Past Surgical History:  Procedure Laterality Date  . ATRIAL  ABLATION SURGERY  2007   S/P slow pathway ablation for atypical AVNRT   . BIOPSY  07/17/2018   Procedure: BIOPSY;  Surgeon: Rush Landmark Telford Nab., MD;  Location: [facility];  Service: Gastroenterology;;  . Steve Anthony  ~ 2008  . COLON RESECTION  02/2013   . COLONOSCOPY  12/25/2007   Colonic polyp status post polypectomy. Internal hemorrhoids.   . CORONARY ANGIOPLASTY WITH STENT PLACEMENT  08/30/2004   Vision; to LAD   . CYSTOSCOPY W/ STONE MANIPULATION  ~ 1998   "once" (11/08/2012)  . EP Study  2008   Negative EP study in [place]  . ESOPHAGOGASTRODUODENOSCOPY (EGD) WITH PROPOFOL N/A 07/17/2018   Procedure: ESOPHAGOGASTRODUODENOSCOPY (EGD) WITH PROPOFOL;  Surgeon: Rush Landmark Telford Nab., MD;  Location: [facility];  Service: Gastroenterology;  Laterality: N/A;  . FRACTURE SURGERY    . LEFT HEART CATHETERIZATION WITH CORONARY ANGIOGRAM N/A 11/10/2012   Procedure: LEFT HEART CATHETERIZATION WITH CORONARY ANGIOGRAM;  Surgeon: Burnell Blanks, MD;  Location: [facility] CATH LAB;  Service: Cardiovascular;  Laterality: N/A;  . LOOP RECORDER IMPLANT N/A 11/10/2012   Medtronic LinQ implanted by Dr Rayann Heman for afib management  . [place] SURGERY  2000; 2002  . POSTERIOR LUMBAR FUSION  2004  . PROSTATE SURGERY  ~ 2009  .  PVI/ CTI ablation  02/25/2009 and 09/2009   S/P afib/ CTI ablation  . SPLENECTOMY, TOTAL N/A 06/07/2019   Procedure: SPLENECTOMY;  Surgeon: Stark Klein, MD;  Location: [facility];  Service: General;  Laterality: N/A;  . [place]   "broke in 3 places; had rod put in" (11/08/2012)  . TIBIA HARDWARE REMOVAL Right ~ 1991  . TONSILLECTOMY  1965  . TOTAL HIP ARTHROPLASTY Right 12/18/2014   Procedure: RIGHT TOTAL HIP ARTHROPLASTY ANTERIOR APPROACH;  Surgeon: Gaynelle Arabian, MD;  Location: [facility];  Service: Orthopedics;  Laterality: Right;  . UPPER ESOPHAGEAL ENDOSCOPIC ULTRASOUND (EUS) N/A 07/17/2018   Procedure: UPPER ESOPHAGEAL ENDOSCOPIC ULTRASOUND (EUS);   Surgeon: Irving Copas., MD;  Location: [facility];  Service: Gastroenterology;  Laterality: N/A;   Social History   Socioeconomic History  . Marital status: Married    Spouse name: Not on file  . Number of children: 1  . Years of education: Not on file  . Highest education level: Not on file  Occupational History  . Occupation: DISABLED    Employer: DISABLED  . 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 Smoking status: Former Smoker    Packs/day: 3.00    Years: 38.00    Pack years: 114.00    Types: Cigarettes    Quit date: 03/25/2004    Years since quitting: 15.7  . Smokeless tobacco: Former Systems developer    Types: Chew    Quit date: 04/25/2004  Vaping Use  . Vaping Use: Never used  Substance and Sexual Activity  . Alcohol use: Not Currently    Comment:  "quit drinking in 1989"  . Drug use: No  . Sexual activity: Not Currently  Other Topics Concern  . Not on file  Social History Narrative   Resides in [place] with his wife   Two children and two grandchildren   Attends [employer]   Social Determinants of Health   Financial Resource Strain:   . Difficulty of Paying Living Expenses: Not on file  Food Insecurity:   . Worried About Charity fundraiser in the Last Year: Not on file  . Ran Out of Food in the Last Year: Not on file  Transportation Needs:   . Lack of Transportation (Medical): Not on file  . Lack of Transportation (Non-Medical): Not on file  Physical Activity:   . Days of Exercise per Week: Not on file  . Minutes of Exercise per Session: Not on file  Stress:   . Feeling of Stress : Not on file  Social Connections:   . Frequency of Communication with Friends and Family: Not on file  . Frequency of Social Gatherings with Friends and Family: Not on file  . Attends Religious Services: Not on file  . Active Member of Clubs or Organizations: Not on file  . Attends Archivist Meetings: Not on file  . Marital Status:  Not on file   Family History  Problem Relation Age of Onset  . Emphysema Mother   . Heart attack Father        CVA  . Hypertension Father   . Diabetes Father   . Heart disease Father        MI  . Emphysema Maternal Grandfather        Smoker  . Heart disease Paternal Grandfather   . Colon cancer Neg Hx   . Esophageal cancer Neg Hx   . Inflammatory bowel disease Neg  Hx   . Liver disease Neg Hx   . Pancreatic cancer Neg Hx   . Rectal cancer Neg Hx   . Stomach cancer Neg Hx    Allergies  Allergen Reactions  . Penicillins Hives and Shortness Of Breath    Did it involve swelling of the face/tongue/throat, SOB, or low BP? Yes Did it involve sudden or severe rash/hives, skin peeling, or any reaction on the inside of your mouth or nose? No Did you need to seek medical attention at a hospital or doctor's office? Yes (emergency room) When did it last happen?1974 If all above answers are "NO", may proceed with cephalosporin use.     Prior to Admission medications   Medication Sig Start Date End Date Taking? Authorizing Provider  acetaminophen (TYLENOL) 325 MG tablet Take 650 mg by mouth every 6 (six) hours as needed for headache.     [provider]  albuterol (VENTOLIN HFA) 108 (90 Base) MCG/ACT inhaler Inhale 2 puffs into the lungs every 4 (four) hours as needed for wheezing or shortness of breath.  01/28/19   [provider]  cholecalciferol (VITAMIN D3) 25 MCG (1000 UNIT) tablet Take 1,000 Units by mouth daily.    [provider]  Cyanocobalamin (VITAMIN B-12 IJ) Inject 1,000 mcg as directed every 30 (thirty) days.    [provider]  docusate sodium (COLACE) 100 MG capsule Take 100 mg by mouth See admin instructions. Three times a week    [provider]  ELIQUIS 5 MG TABS tablet TAKE 1 TABLET BY MOUTH TWICE A DAY 07/24/19   Allred, Jeneen Rinks, MD  escitalopram (LEXAPRO) 10 MG tablet Take 10 mg by mouth daily. 05/30/17   [provider]   fenofibrate 160 MG tablet Take 160 mg by mouth daily.      [provider]  ferrous sulfate 325 (65 FE) MG tablet Take 325 mg by mouth See admin instructions. Take 1 tablet 2 times a week    [provider]  furosemide (LASIX) 20 MG tablet Take 40 mg by mouth daily.    [provider]  gabapentin (NEURONTIN) 300 MG capsule Take 300 mg by mouth 2 (two) times daily.     [provider]  ibuprofen (ADVIL) 200 MG tablet Take 400 mg by mouth every 6 (six) hours as needed.    [provider]  Insulin Pen Needle 29G X 5MM MISC 1 Device by Does not apply route 3 (three) times daily as needed (for use with insulin pen). 06/18/19   Donne Hazel, MD  KLOR-CON M10 10 MEQ tablet Take 10 mEq by mouth daily. 04/23/19   [provider]  Lactobacillus (FLORAJEN ACIDOPHILUS PO) Take 1 capsule by mouth daily.    [provider]  losartan (COZAAR) 50 MG tablet Take 1 tablet by mouth daily. 09/12/19   [provider]  Melatonin 10 MG TABS Take 10 mg by mouth at bedtime.     [provider]  metFORMIN (GLUCOPHAGE) 500 MG tablet Take 500 mg by mouth 2 (two) times daily.    [provider]  metoprolol tartrate (LOPRESSOR) 25 MG tablet TAKE 0.5 TABLETS (12.5 MG TOTAL) BY MOUTH 2 (TWO) TIMES DAILY. 07/23/19   Allred, Jeneen Rinks, MD  midodrine (PROAMATINE) 10 MG tablet Take 0.5 tablets (5 mg total) by mouth 3 (three) times daily with meals. 08/01/19   Baldwin Jamaica, PA-C  montelukast (SINGULAIR) 10 MG tablet Take 1 tablet by mouth daily.  [provider]  naloxone [facility]) 4 MG/0.1ML LIQD nasal spray kit Place 1 spray into the nose daily as needed (opioid overdose).    [provider]  nitroGLYCERIN (NITROSTAT) 0.4 MG SL tablet Place 1 tablet (0.4 mg total) under the tongue every 5 (five) minutes x 3 doses as needed for chest pain. 12/17/16   Barrett, Evelene Croon, PA-C  ondansetron (ZOFRAN ODT) 4 MG disintegrating tablet  Take 1 tablet (4 mg total) by mouth every 6 (six) hours as needed for nausea or vomiting. Place under tongue 03/20/19   Kennedy-Smith, Patrecia Pour, NP  OVER THE COUNTER MEDICATION Apply 1 application topically daily as needed (rash). OTC fanny cream    [provider]  oxyCODONE 10 MG TABS Take 1 tablet (10 mg total) by mouth every 4 (four) hours as needed for severe pain. 06/18/19   Stark Klein, MD  oxyCODONE-acetaminophen (PERCOCET) 10-325 MG tablet Take 1 tablet by mouth every 4 (four) hours.     [provider]  pantoprazole (PROTONIX) 40 MG tablet TAKE 1 TABLET BY MOUTH TWICE(2) DAILY 11/19/19   Mansouraty, Telford Nab., MD  promethazine (PHENERGAN) 25 MG tablet Take 1 tablet (25 mg total) by mouth every 8 (eight) hours as needed for nausea or vomiting. 09/25/18   Jackquline Denmark, MD  Tamsulosin HCl (FLOMAX) 0.4 MG CAPS Take 0.4 mg by mouth daily.      [provider]  tiZANidine (ZANAFLEX) 4 MG tablet Take 4 mg by mouth 2 (two) times daily.  03/01/18   [provider]  traZODone (DESYREL) 50 MG tablet Take 100 mg by mouth at bedtime.  08/17/17   [provider]   DG Lumbar Spine Complete  Result Date: 12/31/2019 CLINICAL DATA:  Hit by tractor EXAM: LUMBAR SPINE - COMPLETE 4+ VIEW COMPARISON:  02/22/2017 FINDINGS: Mild straightening of the lumbar spine. Posterior rods, fixating screws and interbody device at L5-S1. Small metallic fragment posterior to the lower spinal rods without change. Vertebral body heights are maintained. Aortic atherosclerosis. Mild degenerative changes at L2-L3. IMPRESSION: Postsurgical changes at L5-S1. No acute osseous abnormality. Electronically Signed   By: Donavan Foil M.D.   On: 12/31/2019 20:04   CT HEAD WO CONTRAST  Result Date: 12/31/2019 CLINICAL DATA:  Head trauma EXAM: CT HEAD WITHOUT CONTRAST TECHNIQUE: Contiguous axial images were obtained from the base of the skull through the vertex without intravenous contrast.  COMPARISON:  CT brain 05/20/2017 FINDINGS: Brain: No acute territorial infarction, hemorrhage, or intracranial mass is visualized. There is mild atrophy. Stable ventricle size. Vascular: No hyperdense vessels.  Carotid vascular calcification. Skull: Normal. Negative for fracture or focal lesion. Sinuses/Orbits: No acute finding. Other: None IMPRESSION: 1. No CT evidence for acute intracranial abnormality. 2. Mild atrophy. Electronically Signed   By: Donavan Foil M.D.   On: 12/31/2019 20:18   CT PELVIS WO CONTRAST  Result Date: 01/01/2020 CLINICAL DATA:  Pelvic fracture. EXAM: CT PELVIS WITHOUT CONTRAST TECHNIQUE: Multidetector CT imaging of the pelvis was performed following the standard protocol without intravenous contrast. COMPARISON:  CT dated 09/22/2017. X-rays from the same day. CT dated Jun 15, 2019. FINDINGS: Urinary Tract:  No abnormality visualized. Bowel:  Unremarkable visualized pelvic bowel loops. Vascular/Lymphatic: There are atherosclerotic changes of the visualized distal abdominal aorta. There are no concerning pathologically enlarged lymph nodes. Reproductive:  No mass or other significant abnormality Other:  None. Musculoskeletal: There is a hematoma overlying the greater trochanter of the left hip that measures approximately 7.6 x 3.5 cm.  There is a small volume pelvic hematoma surrounding the urinary bladder. There is an acute, nondisplaced fracture through the right posterosuperior iliac crest (axial series 4, image 12). There is a probable nondisplaced fracture through the right L5 transverse process. There is a probable nondisplaced fracture through the right superior hemi sacrum (axial series 4, image 14), not well evaluated secondary to streak artifact. There are minimally displaced fractures involving the bilateral inferior pubic rami and the left parasymphyseal pubic bone. The patient is status post prior total hip arthroplasty on the right. The hardware appears grossly intact where  visualized. There are degenerative changes of the left hip without evidence for an acute displaced fracture. The patient is status post prior L5-S1 posterior fusion. The hardware is grossly intact. IMPRESSION: 1. There are multiple acute fractures of the pelvic bones as detailed above. Most are minimally displaced or nondisplaced. 2. There is a 7.6 cm hematoma overlying the left hip. No evidence for an acute displaced fracture of the proximal left femur. 3. Small volume pelvic hematoma. Aortic Atherosclerosis (ICD10-I70.0). Electronically Signed   By: Constance Holster M.D.   On: 01/01/2020 00:09   DG HIP UNILAT W OR W/O PELVIS 2-3 VIEWS LEFT  Result Date: 12/31/2019 CLINICAL DATA:  Hit by tractor EXAM: DG HIP (WITH OR WITHOUT PELVIS) 2-3V LEFT COMPARISON:  12/19/2017, CT 06/15/2019 FINDINGS: Possible acute nondisplaced fracture involving the left inferior pubic ramus. Left femoral head projects in joint. No fracture or dislocation at the left hip. IMPRESSION: Possible acute nondisplaced fracture involving the left inferior pubic ramus. Electronically Signed   By: Donavan Foil M.D.   On: 12/31/2019 20:07   DG HIP UNILAT W OR W/O PELVIS 2-3 VIEWS RIGHT  Result Date: 12/31/2019 CLINICAL DATA:  Hit by tractor EXAM: DG HIP (WITH OR WITHOUT PELVIS) 2-3V RIGHT COMPARISON:  05/20/2017 FINDINGS: Surgical hardware in the lumbosacral spine. Pubic symphysis appears intact. Probable acute nondisplaced fracture involving the left inferior pubic ramus. Status post right hip arthroplasty with intact hardware and normal alignment. IMPRESSION: Probable acute nondisplaced fracture involving the left inferior pubic ramus. Right hip replacement without acute osseous abnormality. Electronically Signed   By: Donavan Foil M.D.   On: 12/31/2019 20:09    Positive ROS: All other systems have been reviewed and were otherwise negative with the exception of those mentioned in the HPI and as above.  Objective: Labs cbc Recent  Labs    12/31/19 2125  WBC 15.7*  HGB 12.5*  HCT 39.8  PLT 324    Labs inflam No results for input(s): CRP in the last 72 hours.  Invalid input(s): ESR  Labs coag No results for input(s): INR, PTT in the last 72 hours.  Invalid input(s): PT  Recent Labs    12/31/19 2125  NA 135  K 4.5  CL 101  CO2 24  GLUCOSE 164*  BUN 12  CREATININE 1.44*  CALCIUM 8.5*    Physical Exam: Vitals:   12/31/19 2130 12/31/19 2300  BP: 114/85 108/76  Pulse: 80 76  Resp: 12 (!) 22  Temp:    SpO2: 90% 94%   General: Alert, no acute distress.   Mental status: Alert and Oriented x3 Neurologic: Speech Clear and organized, no gross focal findings or movement disorder appreciated. Respiratory: No cyanosis, no use of accessory musculature Cardiovascular: No pedal edema GI: Abdomen is soft and non-tender, non-distended. Skin: Warm and dry.  No lesions in the area of chief complaint. Extremities: Warm and well perfused w/o edema Psychiatric:  Patient is competent for consent with normal mood and affect  MUSCULOSKELETAL:  bilat hip TTP without deformity Other extremities are atraumatic with painless ROM and NVI.  Assessment / Plan: Active Problems:   Pelvic fracture (HCC)  Films reviewed by Dr. Percell Miller. Left pelvic ramus fracture nondisplaced. Right THA intact.  Plan for admission by medical team for pain management and management of co-morbidities.  Pelvic ramus fracture most-likely will be non-op management.  Okay for bedrest tonight due to pain. Will plan for PT/OT evaluation and treatment.  Orthopedic team will further evaluate and give further recommendations, if any needed, later today.      Rachael Fee PA-C 01/01/2020 12:47 AM

## 2020-01-01 NOTE — H&P (Signed)
History and Physical    Steve Anthony JQZ:[id number] DOB: 14-Jan-1951 DOA: 12/31/2019  PCP: [name], MD  Patient coming from: Home.  Chief Complaint: Tractor rolled on his hip.  HPI: Steve Anthony is a 69 y.o. male with history of A. fib on Eliquis, CAD status post stenting, history of idiopathic pancreatitis status post distal pancreatectomy and splenectomy early part of this year with history of chronic anemia COPD prediabetes was brought to the ER with the patient was not done by his tractor when he was working on it.  Patient states that the tractor started moving and went on his right hip.  He did not go over him.  He did not lose consciousness or hit his head.  ED Course: In the ER x-rays revealed pelvic fracture this was further confirmed with CT pelvis which shows a 7.6 cm hematoma overlying the left hip.  There was also a small amount of pelvic hematoma.  Patient labs are significant for acute renal failure and hemoglobin was around 12.5 WBC of 15.7.  CT head was unremarkable.  Discussed with on-call orthopedic surgeon Dr. Percell Miller.  Patient will be admitted for further care observation.  Review of Systems: As per HPI, rest all negative.   Past Medical History:  Diagnosis Date  . Anemia   . Arthritis   . Asthma   . CAD (coronary artery disease)    a. S/P PCI mid LAD (vision stent) 08/31/2004;  b. repeat cath 6/11 - no progression of CAD. c. Cath 10/2012 given moderate CAD on cardiac CT - no change from prior cath.  . Chest pain 12/16/2016  . COPD (chronic obstructive pulmonary disease) ([place])   . COVID-19 virus infection   . Diverticulitis   . DJD (degenerative joint disease) of lumbar spine   . Dyslipidemia   . Gallstones   . GERD (gastroesophageal reflux disease)   . History of kidney stones   . Hypertension   . Hyperthyroidism   . Melanoma ([place])    melanoma removed from neck   . On home oxygen therapy    "2L q hs" (11/08/2012),  06/04/19 pt. not on oxygen  . OSA  (obstructive sleep apnea)    a. cpap noncompliance- patient reports on 12/10/14- does not use CPAP  . Palpitations    a. 10/2012 - s/p LINQ loop recorder to assess for arrhythmia.   . Pancreatitis   . Paroxysmal A-fib ([place])    a. S/P PVI 2/11 and 9/11  . PICC (peripherally inserted central catheter) in place   . Pneumonia   . SVT (supraventricular tachycardia) ([place])    a. Atypical AVNRT of the slow pathway  . Type II diabetes mellitus ([place])     Past Surgical History:  Procedure Laterality Date  . ATRIAL ABLATION SURGERY  2007   S/P slow pathway ablation for atypical AVNRT   . BIOPSY  07/17/2018   Procedure: BIOPSY;  Surgeon: Rush Landmark Telford Nab., MD;  Location: [facility];  Service: Gastroenterology;;  . [facility]  ~ 2008  . COLON RESECTION  02/2013   . COLONOSCOPY  12/25/2007   Colonic polyp status post polypectomy. Internal hemorrhoids.   . CORONARY ANGIOPLASTY WITH STENT PLACEMENT  08/30/2004   Vision; to LAD   . CYSTOSCOPY W/ STONE MANIPULATION  ~ 1998   "once" (11/08/2012)  . EP Study  2008   Negative EP study in [place]  . ESOPHAGOGASTRODUODENOSCOPY (EGD) WITH PROPOFOL N/A 07/17/2018   Procedure: ESOPHAGOGASTRODUODENOSCOPY (EGD) WITH PROPOFOL;  Surgeon: Irving Copas., MD;  Location: [facility];  Service: Gastroenterology;  Laterality: N/A;  . FRACTURE SURGERY    . LEFT HEART CATHETERIZATION WITH CORONARY ANGIOGRAM N/A 11/10/2012   Procedure: LEFT HEART CATHETERIZATION WITH CORONARY ANGIOGRAM;  Surgeon: Burnell Blanks, MD;  Location: [facility] CATH LAB;  Service: Cardiovascular;  Laterality: N/A;  . LOOP RECORDER IMPLANT N/A 11/10/2012   Medtronic LinQ implanted by Dr Rayann Heman for afib management  . [place] SURGERY  2000; 2002  . POSTERIOR LUMBAR FUSION  2004  . PROSTATE SURGERY  ~ 2009  . PVI/ CTI ablation  02/25/2009 and 09/2009   S/P afib/ CTI ablation  . SPLENECTOMY, TOTAL N/A 06/07/2019   Procedure: SPLENECTOMY;  Surgeon: Stark Klein, MD;   Location: [facility];  Service: General;  Laterality: N/A;  . [place]   "broke in 3 places; had rod put in" (11/08/2012)  . TIBIA HARDWARE REMOVAL Right ~ 1991  . TONSILLECTOMY  1965  . TOTAL HIP ARTHROPLASTY Right 12/18/2014   Procedure: RIGHT TOTAL HIP ARTHROPLASTY ANTERIOR APPROACH;  Surgeon: Gaynelle Arabian, MD;  Location: [facility];  Service: Orthopedics;  Laterality: Right;  . UPPER ESOPHAGEAL ENDOSCOPIC ULTRASOUND (EUS) N/A 07/17/2018   Procedure: UPPER ESOPHAGEAL ENDOSCOPIC ULTRASOUND (EUS);  Surgeon: Irving Copas., MD;  Location: [facility];  Service: Gastroenterology;  Laterality: N/A;     reports that he quit smoking about 15 years ago. His smoking use included cigarettes. He has a 114.00 pack-year smoking history. He quit smokeless tobacco use about 15 years ago.  His smokeless tobacco use included chew. He reports previous alcohol use. He reports that he does not use drugs.  Allergies  Allergen Reactions  . Penicillins Hives and Shortness Of Breath    Did it involve swelling of the face/tongue/throat, SOB, or low BP? Yes Did it involve sudden or severe rash/hives, skin peeling, or any reaction on the inside of your mouth or nose? No Did you need to seek medical attention at a hospital or doctor's office? Yes (emergency room) When did it last happen?1974 If all above answers are "NO", may proceed with cephalosporin use.      Family History  Problem Relation Age of Onset  . Emphysema Mother   . Heart attack Father        CVA  . Hypertension Father   . Diabetes Father   . Heart disease Father        MI  . Emphysema Maternal Grandfather        Smoker  . Heart disease Paternal Grandfather   . Colon cancer Neg Hx   . Esophageal cancer Neg Hx   . Inflammatory bowel disease Neg Hx   . Liver disease Neg Hx   . Pancreatic cancer Neg Hx   . Rectal cancer Neg Hx   . Stomach cancer Neg Hx     Prior to Admission medications   Medication Sig  Start Date End Date Taking? Authorizing Provider  acetaminophen (TYLENOL) 325 MG tablet Take 650 mg by mouth every 6 (six) hours as needed for headache.     [provider]  albuterol (VENTOLIN HFA) 108 (90 Base) MCG/ACT inhaler Inhale 2 puffs into the lungs every 4 (four) hours as needed for wheezing or shortness of breath.  01/28/19   [provider]  cholecalciferol (VITAMIN D3) 25 MCG (1000 UNIT) tablet Take 1,000 Units by mouth daily.    [provider]  Cyanocobalamin (VITAMIN B-12 IJ) Inject 1,000 mcg  as directed every 30 (thirty) days.    [provider]  docusate sodium (COLACE) 100 MG capsule Take 100 mg by mouth See admin instructions. Three times a week    [provider]  ELIQUIS 5 MG TABS tablet TAKE 1 TABLET BY MOUTH TWICE A DAY 07/24/19   Allred, Jeneen Rinks, MD  escitalopram (LEXAPRO) 10 MG tablet Take 10 mg by mouth daily. 05/30/17   [provider]  fenofibrate 160 MG tablet Take 160 mg by mouth daily.      [provider]  ferrous sulfate 325 (65 FE) MG tablet Take 325 mg by mouth See admin instructions. Take 1 tablet 2 times a week    [provider]  furosemide (LASIX) 20 MG tablet Take 40 mg by mouth daily.    [provider]  gabapentin (NEURONTIN) 300 MG capsule Take 300 mg by mouth 2 (two) times daily.     [provider]  ibuprofen (ADVIL) 200 MG tablet Take 400 mg by mouth every 6 (six) hours as needed.    [provider]  Insulin Pen Needle 29G X 5MM MISC 1 Device by Does not apply route 3 (three) times daily as needed (for use with insulin pen). 06/18/19   Donne Hazel, MD  KLOR-CON M10 10 MEQ tablet Take 10 mEq by mouth daily. 04/23/19   [provider]  Lactobacillus (FLORAJEN ACIDOPHILUS PO) Take 1 capsule by mouth daily.    [provider]  losartan (COZAAR) 50 MG tablet Take 1 tablet by mouth daily. 09/12/19   [provider]  Melatonin 10 MG TABS Take  10 mg by mouth at bedtime.     [provider]  metFORMIN (GLUCOPHAGE) 500 MG tablet Take 500 mg by mouth 2 (two) times daily.    [provider]  metoprolol tartrate (LOPRESSOR) 25 MG tablet TAKE 0.5 TABLETS (12.5 MG TOTAL) BY MOUTH 2 (TWO) TIMES DAILY. 07/23/19   Allred, Jeneen Rinks, MD  midodrine (PROAMATINE) 10 MG tablet Take 0.5 tablets (5 mg total) by mouth 3 (three) times daily with meals. 08/01/19   Baldwin Jamaica, PA-C  montelukast (SINGULAIR) 10 MG tablet Take 1 tablet by mouth daily.    [provider]  naloxone [facility]) 4 MG/0.1ML LIQD nasal spray kit Place 1 spray into the nose daily as needed (opioid overdose).    [provider]  nitroGLYCERIN (NITROSTAT) 0.4 MG SL tablet Place 1 tablet (0.4 mg total) under the tongue every 5 (five) minutes x 3 doses as needed for chest pain. 12/17/16   Barrett, Evelene Croon, PA-C  ondansetron (ZOFRAN ODT) 4 MG disintegrating tablet Take 1 tablet (4 mg total) by mouth every 6 (six) hours as needed for nausea or vomiting. Place under tongue 03/20/19   Kennedy-Smith, Patrecia Pour, NP  OVER THE COUNTER MEDICATION Apply 1 application topically daily as needed (rash). OTC fanny cream    [provider]  oxyCODONE 10 MG TABS Take 1 tablet (10 mg total) by mouth every 4 (four) hours as needed for severe pain. 06/18/19   Stark Klein, MD  oxyCODONE-acetaminophen (PERCOCET) 10-325 MG tablet Take 1 tablet by mouth every 4 (four) hours.     [provider]  pantoprazole (PROTONIX) 40 MG tablet TAKE 1 TABLET BY MOUTH TWICE(2) DAILY 11/19/19   Mansouraty, Telford Nab., MD  promethazine (PHENERGAN) 25 MG tablet Take 1 tablet (25 mg total) by mouth every 8 (eight) hours as needed for nausea or vomiting. 09/25/18   Lyndel Safe,  Peyton Bottoms, MD  Tamsulosin HCl (FLOMAX) 0.4 MG CAPS Take 0.4 mg by mouth daily.      [provider]  tiZANidine (ZANAFLEX) 4 MG tablet Take 4 mg by mouth 2 (two) times daily.  03/01/18   [provider]  traZODone (DESYREL) 50 MG tablet Take 100 mg by mouth at bedtime.  08/17/17   [provider]    Physical Exam: Constitutional: Moderately built and nourished. Vitals:   12/31/19 2330 01/01/20 0045 01/01/20 0100 01/01/20 0130  BP: 100/70 100/72 110/75 109/73  Pulse: 76 77 75 76  Resp: _0 (!) 21  Temp:      TempSrc:      SpO2: 93% 95% 95% 95%  Weight:      Height:       Eyes: Anicteric no pallor. ENMT: No discharge from the ears eyes nose or mouth. Neck: No mass felt.  No neck rigidity. Respiratory: No rhonchi or crepitations. Cardiovascular: S1-S2 heard. Abdomen: Soft nontender bowel sounds present.  No obvious ecchymosis. Musculoskeletal: Pain on moving the hip. Skin: No obvious ecchymosis. Neurologic: Alert awake oriented to time place and person.  Moves all extremities. Psychiatric: Appears normal.  Normal affect.   Labs on Admission: I have personally reviewed following labs and imaging studies  CBC: Recent Labs  Lab 12/31/19 2125  WBC 15.7*  HGB 12.5*  HCT 39.8  MCV 91.9  PLT 300   Basic Metabolic Panel: Recent Labs  Lab 12/31/19 2125  NA 135  K 4.5  CL 101  CO2 24  GLUCOSE 164*  BUN 12  CREATININE 1.44*  CALCIUM 8.5*   GFR: Estimated Creatinine Clearance: 61 mL/min (A) (by C-G formula based on SCr of 1.44 mg/dL (H)). Liver Function Tests: No results for input(s): AST, ALT, ALKPHOS, BILITOT, PROT, ALBUMIN in the last 168 hours. No results for input(s): LIPASE, AMYLASE in the last 168 hours. No results for input(s): AMMONIA in the last 168 hours. Coagulation Profile: No results for input(s): INR, PROTIME in the last 168 hours. Cardiac Enzymes: No results for input(s): CKTOTAL, CKMB, CKMBINDEX, TROPONINI in the last 168 hours. BNP (last 3 results) No results for input(s): PROBNP in the last 8760 hours. HbA1C: No results for input(s): HGBA1C in the last 72 hours. CBG: No results for input(s): GLUCAP in the last 168 hours. Lipid  Profile: No results for input(s): CHOL, HDL, LDLCALC, TRIG, CHOLHDL, LDLDIRECT in the last 72 hours. Thyroid Function Tests: No results for input(s): TSH, T4TOTAL, FREET4, T3FREE, THYROIDAB in the last 72 hours. Anemia Panel: No results for input(s): VITAMINB12, FOLATE, FERRITIN, TIBC, IRON, RETICCTPCT in the last 72 hours. Urine analysis:    Component Value Date/Time   COLORURINE YELLOW 06/16/2019 1744   APPEARANCEUR HAZY (A) 06/16/2019 1744   LABSPEC 1.023 06/16/2019 1744   PHURINE 5.0 06/16/2019 1744   GLUCOSEU NEGATIVE 06/16/2019 1744   HGBUR NEGATIVE 06/16/2019 1744   BILIRUBINUR NEGATIVE 06/16/2019 1744   KETONESUR NEGATIVE 06/16/2019 1744   PROTEINUR NEGATIVE 06/16/2019 1744   NITRITE NEGATIVE 06/16/2019 1744   LEUKOCYTESUR LARGE (A) 06/16/2019 1744   Sepsis Labs: _1 (procalcitonin:4,lacticidven:4) ) Recent Results (from the past 240 hour(s))  Resp Panel by RT-PCR (Flu A&B, Covid) Nasopharyngeal Swab     Status: None   Collection Time: 12/31/19  9:25 PM   Specimen: Nasopharyngeal Swab; Nasopharyngeal(NP) swabs in vial transport medium  Result Value Ref Range Status   SARS Coronavirus 2 by RT PCR NEGATIVE NEGATIVE Final    Comment: (NOTE) SARS-CoV-2  target nucleic acids are NOT DETECTED.  The SARS-CoV-2 RNA is generally detectable in upper respiratory specimens during the acute phase of infection. The lowest concentration of SARS-CoV-2 viral copies this assay can detect is 138 copies/mL. A negative result does not preclude SARS-Cov-2 infection and should not be used as the sole basis for treatment or other patient management decisions. A negative result may occur with  improper specimen collection/handling, submission of specimen other than nasopharyngeal swab, presence of viral mutation(s) within the areas targeted by this assay, and inadequate number of viral copies(<138 copies/mL). A negative result must be combined with clinical observations, patient  history, and epidemiological information. The expected result is Negative.  Fact Sheet for Patients:  EntrepreneurPulse.com.au  Fact Sheet for Healthcare Providers:  IncredibleEmployment.be  This test is no t yet approved or cleared by the [place] FDA and  has been authorized for detection and/or diagnosis of SARS-CoV-2 by FDA under an Emergency Use Authorization (EUA). This EUA will remain  in effect (meaning this test can be used) for the duration of the COVID-19 declaration under Section 564(b)(1) of the Act, 21 U.S.C.section 360bbb-3(b)(1), unless the authorization is terminated  or revoked sooner.       Influenza A by PCR NEGATIVE NEGATIVE Final   Influenza B by PCR NEGATIVE NEGATIVE Final    Comment: (NOTE) The Xpert Xpress SARS-CoV-2/FLU/RSV plus assay is intended as an aid in the diagnosis of influenza from Nasopharyngeal swab specimens and should not be used as a sole basis for treatment. Nasal washings and aspirates are unacceptable for Xpert Xpress SARS-CoV-2/FLU/RSV testing.  Fact Sheet for Patients: EntrepreneurPulse.com.au  Fact Sheet for Healthcare Providers: IncredibleEmployment.be  This test is not yet approved or cleared by the [place] FDA and has been authorized for detection and/or diagnosis of SARS-CoV-2 by FDA under an Emergency Use Authorization (EUA). This EUA will remain in effect (meaning this test can be used) for the duration of the COVID-19 declaration under Section 564(b)(1) of the Act, 21 U.S.C. section 360bbb-3(b)(1), unless the authorization is terminated or revoked.  Performed at [facility], [place] [address], [place], [place] [address]      Radiological Exams on Admission: DG Lumbar Spine Complete  Result Date: 12/31/2019 CLINICAL DATA:  Hit by tractor EXAM: LUMBAR SPINE - COMPLETE 4+ VIEW COMPARISON:  02/22/2017 FINDINGS: Mild straightening of  the lumbar spine. Posterior rods, fixating screws and interbody device at L5-S1. Small metallic fragment posterior to the lower spinal rods without change. Vertebral body heights are maintained. Aortic atherosclerosis. Mild degenerative changes at L2-L3. IMPRESSION: Postsurgical changes at L5-S1. No acute osseous abnormality. Electronically Signed   By: Donavan Foil M.D.   On: 12/31/2019 20:04   CT HEAD WO CONTRAST  Result Date: 12/31/2019 CLINICAL DATA:  Head trauma EXAM: CT HEAD WITHOUT CONTRAST TECHNIQUE: Contiguous axial images were obtained from the base of the skull through the vertex without intravenous contrast. COMPARISON:  CT brain 05/20/2017 FINDINGS: Brain: No acute territorial infarction, hemorrhage, or intracranial mass is visualized. There is mild atrophy. Stable ventricle size. Vascular: No hyperdense vessels.  Carotid vascular calcification. Skull: Normal. Negative for fracture or focal lesion. Sinuses/Orbits: No acute finding. Other: None IMPRESSION: 1. No CT evidence for acute intracranial abnormality. 2. Mild atrophy. Electronically Signed   By: Donavan Foil M.D.   On: 12/31/2019 20:18   CT PELVIS WO CONTRAST  Result Date: 01/01/2020 CLINICAL DATA:  Pelvic fracture. EXAM: CT PELVIS WITHOUT CONTRAST TECHNIQUE: Multidetector CT imaging of the pelvis  was performed following the standard protocol without intravenous contrast. COMPARISON:  CT dated 09/22/2017. X-rays from the same day. CT dated Jun 15, 2019. FINDINGS: Urinary Tract:  No abnormality visualized. Bowel:  Unremarkable visualized pelvic bowel loops. Vascular/Lymphatic: There are atherosclerotic changes of the visualized distal abdominal aorta. There are no concerning pathologically enlarged lymph nodes. Reproductive:  No mass or other significant abnormality Other:  None. Musculoskeletal: There is a hematoma overlying the greater trochanter of the left hip that measures approximately 7.6 x 3.5 cm. There is a small volume pelvic  hematoma surrounding the urinary bladder. There is an acute, nondisplaced fracture through the right posterosuperior iliac crest (axial series 4, image 12). There is a probable nondisplaced fracture through the right L5 transverse process. There is a probable nondisplaced fracture through the right superior hemi sacrum (axial series 4, image 14), not well evaluated secondary to streak artifact. There are minimally displaced fractures involving the bilateral inferior pubic rami and the left parasymphyseal pubic bone. The patient is status post prior total hip arthroplasty on the right. The hardware appears grossly intact where visualized. There are degenerative changes of the left hip without evidence for an acute displaced fracture. The patient is status post prior L5-S1 posterior fusion. The hardware is grossly intact. IMPRESSION: 1. There are multiple acute fractures of the pelvic bones as detailed above. Most are minimally displaced or nondisplaced. 2. There is a 7.6 cm hematoma overlying the left hip. No evidence for an acute displaced fracture of the proximal left femur. 3. Small volume pelvic hematoma. Aortic Atherosclerosis (ICD10-I70.0). Electronically Signed   By: Constance Holster M.D.   On: 01/01/2020 00:09   DG HIP UNILAT W OR W/O PELVIS 2-3 VIEWS LEFT  Result Date: 12/31/2019 CLINICAL DATA:  Hit by tractor EXAM: DG HIP (WITH OR WITHOUT PELVIS) 2-3V LEFT COMPARISON:  12/19/2017, CT 06/15/2019 FINDINGS: Possible acute nondisplaced fracture involving the left inferior pubic ramus. Left femoral head projects in joint. No fracture or dislocation at the left hip. IMPRESSION: Possible acute nondisplaced fracture involving the left inferior pubic ramus. Electronically Signed   By: Donavan Foil M.D.   On: 12/31/2019 20:07   DG HIP UNILAT W OR W/O PELVIS 2-3 VIEWS RIGHT  Result Date: 12/31/2019 CLINICAL DATA:  Hit by tractor EXAM: DG HIP (WITH OR WITHOUT PELVIS) 2-3V RIGHT COMPARISON:  05/20/2017  FINDINGS: Surgical hardware in the lumbosacral spine. Pubic symphysis appears intact. Probable acute nondisplaced fracture involving the left inferior pubic ramus. Status post right hip arthroplasty with intact hardware and normal alignment. IMPRESSION: Probable acute nondisplaced fracture involving the left inferior pubic ramus. Right hip replacement without acute osseous abnormality. Electronically Signed   By: Donavan Foil M.D.   On: 12/31/2019 20:09     Assessment/Plan Principal Problem:   Pelvic fracture (HCC) Active Problems:   Essential hypertension   Atrial fibrillation (HCC)   COPD without exacerbation (HCC)   Sleep apnea   Long term (current) use of anticoagulants [Z79.01]   S/P coronary artery stent placement   Idiopathic acute pancreatitis   Cirrhosis of liver without ascites (HCC)   CKD (chronic kidney disease), stage III ([place])   History of pelvic fracture    1. Multiple comminuted nondisplaced pelvic fracture seen in the CAT scan with hematoma in a patient with Eliquis discussed with on-call orthopedic surgeon Dr. Percell Miller will be seeing patient in consult until then patient will be kept n.p.o. and Eliquis will be held.  Briefly after talking to orthopedic surgeon patient mildly  became hypotensive which responded quickly to fluid bolus.  We will continue to monitor in stepdown. 2. A. fib Eliquis on hold due to hematoma of the pelvis.  Restart when okay with orthopedic surgery.  Continue amiodarone but hold beta-blockers due to brief episode of hypotension. 3. CAD status post stenting denies any chest pain. 4. Hypertension holding antihypertensives due to brief episode of hypotension in the setting of pelvic hematoma with Eliquis. 5. Prediabetes we will check CBGs.  Check hemoglobin A1c. 6. COPD not actively wheezing. 7. History of idiopathic pancreatitis status post distal pancreatectomy and splenectomy early part of this year by Dr. [name]. 8. Anemia appears to be chronic  follow CBC to make sure there is no significant drop in hemoglobin due to #1. 9. History of sleep apnea.  Since patient has pelvic hematoma with fracture in the setting of Eliquis will need close monitor for any further worsening of bleed and patient also was transiently hypotensive.   DVT prophylaxis: SCDs. Code Status: Full code. Family Communication: Patient's wife. Disposition Plan: May need rehab. Consults called: Orthopedic surgery. Admission status: Inpatient.   Rise Patience MD [facility] Pager [phone].  If 7PM-7AM, please contact night-coverage [email or web address] Password [email or web address]  01/01/2020, 2:07 AM

## 2020-01-01 NOTE — Progress Notes (Addendum)
Paged MD on call for patient to have something for nausea. No active vomiting at this time. New orders noted.  0700- Patient's wife, Steve Anthony updated on husband's status. At the bedside at this time, talking with husband. Morphine and zofran given prn for pain and nausea.

## 2020-01-01 NOTE — Consult Note (Cosign Needed)
Reason for Consult:Pelvic fxs Referring Physician: Alain Marion Time called: 0830 Time at bedside: Steve Anthony is an 69 y.o. male.  HPI: Steve Anthony was working on his tractor when it engaged in [employer] and started moving. As it slowed he tried to stop it before it went into a creek but was hit by it and knocked to the ground. He had immediate pelvic and hip pain and could not get up. He was brought to the ED where x-rays showed multiple pelvic fxs and orthopedic surgery was consulted. Due to the nature of the pelvic fxs orthopedic trauma consultation was requested. He c/o severe pelvic and back pain and can't even roll over in bed without excruciating pain.  Past Medical History:  Diagnosis Date  . Anemia   . Arthritis   . Asthma   . CAD (coronary artery disease)    a. S/P PCI mid LAD (vision stent) 08/31/2004;  b. repeat cath 6/11 - no progression of CAD. c. Cath 10/2012 given moderate CAD on cardiac CT - no change from prior cath.  . Chest pain 12/16/2016  . COPD (chronic obstructive pulmonary disease) ([place])   . COVID-19 virus infection   . Diverticulitis   . DJD (degenerative joint disease) of lumbar spine   . Dyslipidemia   . Gallstones   . GERD (gastroesophageal reflux disease)   . History of kidney stones   . Hypertension   . Hyperthyroidism   . Melanoma ([place])    melanoma removed from neck   . On home oxygen therapy    "2L q hs" (11/08/2012),  06/04/19 pt. not on oxygen  . OSA (obstructive sleep apnea)    a. cpap noncompliance- patient reports on 12/10/14- does not use CPAP  . Palpitations    a. 10/2012 - s/p LINQ loop recorder to assess for arrhythmia.   . Pancreatitis   . Paroxysmal A-fib ([place])    a. S/P PVI 2/11 and 9/11  . PICC (peripherally inserted central catheter) in place   . Pneumonia   . SVT (supraventricular tachycardia) ([place])    a. Atypical AVNRT of the slow pathway  . Type II diabetes mellitus ([place])     Past Surgical History:  Procedure Laterality Date  .  ATRIAL ABLATION SURGERY  2007   S/P slow pathway ablation for atypical AVNRT   . BIOPSY  07/17/2018   Procedure: BIOPSY;  Surgeon: Rush Landmark Telford Nab., MD;  Location: [facility];  Service: Gastroenterology;;  . [facility]  ~ 2008  . COLON RESECTION  02/2013   . COLONOSCOPY  12/25/2007   Colonic polyp status post polypectomy. Internal hemorrhoids.   . CORONARY ANGIOPLASTY WITH STENT PLACEMENT  08/30/2004   Vision; to LAD   . CYSTOSCOPY W/ STONE MANIPULATION  ~ 1998   "once" (11/08/2012)  . EP Study  2008   Negative EP study in [place]  . ESOPHAGOGASTRODUODENOSCOPY (EGD) WITH PROPOFOL N/A 07/17/2018   Procedure: ESOPHAGOGASTRODUODENOSCOPY (EGD) WITH PROPOFOL;  Surgeon: Rush Landmark Telford Nab., MD;  Location: [facility];  Service: Gastroenterology;  Laterality: N/A;  . FRACTURE SURGERY    . LEFT HEART CATHETERIZATION WITH CORONARY ANGIOGRAM N/A 11/10/2012   Procedure: LEFT HEART CATHETERIZATION WITH CORONARY ANGIOGRAM;  Surgeon: Burnell Blanks, MD;  Location: [facility] CATH LAB;  Service: Cardiovascular;  Laterality: N/A;  . LOOP RECORDER IMPLANT N/A 11/10/2012   Medtronic LinQ implanted by Dr Rayann Heman for afib management  . [place] SURGERY  2000; 2002  . POSTERIOR LUMBAR FUSION  2004  .  PROSTATE SURGERY  ~ 2009  . PVI/ CTI ablation  02/25/2009 and 09/2009   S/P afib/ CTI ablation  . SPLENECTOMY, TOTAL N/A 06/07/2019   Procedure: SPLENECTOMY;  Surgeon: Stark Klein, MD;  Location: [facility];  Service: General;  Laterality: N/A;  . [place]   "broke in 3 places; had rod put in" (11/08/2012)  . TIBIA HARDWARE REMOVAL Right ~ 1991  . TONSILLECTOMY  1965  . TOTAL HIP ARTHROPLASTY Right 12/18/2014   Procedure: RIGHT TOTAL HIP ARTHROPLASTY ANTERIOR APPROACH;  Surgeon: Gaynelle Arabian, MD;  Location: [facility];  Service: Orthopedics;  Laterality: Right;  . UPPER ESOPHAGEAL ENDOSCOPIC ULTRASOUND (EUS) N/A 07/17/2018   Procedure: UPPER ESOPHAGEAL ENDOSCOPIC ULTRASOUND  (EUS);  Surgeon: Irving Copas., MD;  Location: [facility];  Service: Gastroenterology;  Laterality: N/A;    Family History  Problem Relation Age of Onset  . Emphysema Mother   . Heart attack Father        CVA  . Hypertension Father   . Diabetes Father   . Heart disease Father        MI  . Emphysema Maternal Grandfather        Smoker  . Heart disease Paternal Grandfather   . Colon cancer Neg Hx   . Esophageal cancer Neg Hx   . Inflammatory bowel disease Neg Hx   . Liver disease Neg Hx   . Pancreatic cancer Neg Hx   . Rectal cancer Neg Hx   . Stomach cancer Neg Hx     Social History:  reports that he quit smoking about 15 years ago. His smoking use included cigarettes. He has a 114.00 pack-year smoking history. He quit smokeless tobacco use about 15 years ago.  His smokeless tobacco use included chew. He reports previous alcohol use. He reports that he does not use drugs.  Allergies:  Allergies  Allergen Reactions  . Penicillins Hives and Shortness Of Breath    Did it involve swelling of the face/tongue/throat, SOB, or low BP? Yes Did it involve sudden or severe rash/hives, skin peeling, or any reaction on the inside of your mouth or nose? No Did you need to seek medical attention at a hospital or doctor's office? Yes (emergency room) When did it last happen?1974 If all above answers are "NO", may proceed with cephalosporin use.      Medications: I have reviewed the patient's current medications.  Results for orders placed or performed during the hospital encounter of 12/31/19 (from the past 48 hour(s))  Basic metabolic panel     Status: Abnormal   Collection Time: 12/31/19  9:25 PM  Result Value Ref Range   Sodium 135 135 - 145 mmol/L   Potassium 4.5 3.5 - 5.1 mmol/L   Chloride 101 98 - 111 mmol/L   CO2 24 22 - 32 mmol/L   Glucose, Bld 164 (H) 70 - 99 mg/dL    Comment: Glucose reference range applies only to samples taken after fasting for at least 8  hours.   BUN 12 8 - 23 mg/dL   Creatinine, Ser 1.44 (H) 0.61 - 1.24 mg/dL   Calcium 8.5 (L) 8.9 - 10.3 mg/dL   GFR, Estimated 53 (L) >60 mL/min    Comment: (NOTE) Calculated using the CKD-EPI Creatinine Equation (2021)    Anion gap 10 5 - 15    Comment: Performed at [facility] [address], [place], [place] [address]  CBC     Status: Abnormal  Collection Time: 12/31/19  9:25 PM  Result Value Ref Range   WBC 15.7 (H) 4.0 - 10.5 K/uL   RBC 4.33 4.22 - 5.81 MIL/uL   Hemoglobin 12.5 (L) 13.0 - 17.0 g/dL   HCT 39.8 39 - 52 %   MCV 91.9 80.0 - 100.0 fL   MCH 28.9 26.0 - 34.0 pg   MCHC 31.4 30.0 - 36.0 g/dL   RDW 14.6 11.5 - 15.5 %   Platelets 324 150 - 400 K/uL   nRBC 0.0 0.0 - 0.2 %    Comment: Performed at [facility], [place] [address], [place], [place] [address]  Resp Panel by RT-PCR (Flu A&B, Covid) Nasopharyngeal Swab     Status: None   Collection Time: 12/31/19  9:25 PM   Specimen: Nasopharyngeal Swab; Nasopharyngeal(NP) swabs in vial transport medium  Result Value Ref Range   SARS Coronavirus 2 by RT PCR NEGATIVE NEGATIVE    Comment: (NOTE) SARS-CoV-2 target nucleic acids are NOT DETECTED.  The SARS-CoV-2 RNA is generally detectable in upper respiratory specimens during the acute phase of infection. The lowest concentration of SARS-CoV-2 viral copies this assay can detect is 138 copies/mL. A negative result does not preclude SARS-Cov-2 infection and should not be used as the sole basis for treatment or other patient management decisions. A negative result may occur with  improper specimen collection/handling, submission of specimen other than nasopharyngeal swab, presence of viral mutation(s) within the areas targeted by this assay, and inadequate number of viral copies(<138 copies/mL). A negative result must be combined with clinical observations, patient history, and epidemiological information. The expected result is Negative.  Fact Sheet for  Patients:  EntrepreneurPulse.com.au  Fact Sheet for Healthcare Providers:  IncredibleEmployment.be  This test is no t yet approved or cleared by the [place] FDA and  has been authorized for detection and/or diagnosis of SARS-CoV-2 by FDA under an Emergency Use Authorization (EUA). This EUA will remain  in effect (meaning this test can be used) for the duration of the COVID-19 declaration under Section 564(b)(1) of the Act, 21 U.S.C.section 360bbb-3(b)(1), unless the authorization is terminated  or revoked sooner.       Influenza A by PCR NEGATIVE NEGATIVE   Influenza B by PCR NEGATIVE NEGATIVE    Comment: (NOTE) The Xpert Xpress SARS-CoV-2/FLU/RSV plus assay is intended as an aid in the diagnosis of influenza from Nasopharyngeal swab specimens and should not be used as a sole basis for treatment. Nasal washings and aspirates are unacceptable for Xpert Xpress SARS-CoV-2/FLU/RSV testing.  Fact Sheet for Patients: EntrepreneurPulse.com.au  Fact Sheet for Healthcare Providers: IncredibleEmployment.be  This test is not yet approved or cleared by the [place] FDA and has been authorized for detection and/or diagnosis of SARS-CoV-2 by FDA under an Emergency Use Authorization (EUA). This EUA will remain in effect (meaning this test can be used) for the duration of the COVID-19 declaration under Section 564(b)(1) of the Act, 21 U.S.C. section 360bbb-3(b)(1), unless the authorization is terminated or revoked.  Performed at [facility], [place] [address], [place], [place] [address]   Glucose, capillary     Status: None   Collection Time: 01/01/20  3:24 AM  Result Value Ref Range   Glucose-Capillary 97 70 - 99 mg/dL    Comment: Glucose reference range applies only to samples taken after fasting for at least 8 hours.  MRSA PCR Screening     Status: None   Collection Time: 01/01/20  4:09 AM  Specimen: Nasal Mucosa; Nasopharyngeal  Result Value Ref Range   MRSA by PCR NEGATIVE NEGATIVE    Comment:        The GeneXpert MRSA Assay (FDA approved for NASAL specimens only), is one component of a comprehensive MRSA colonization surveillance program. It is not intended to diagnose MRSA infection nor to guide or monitor treatment for MRSA infections. Performed at [facility], [place] [address] [place], [place] [address]   CBC     Status: Abnormal   Collection Time: 01/01/20  4:55 AM  Result Value Ref Range   WBC 11.7 (H) 4.0 - 10.5 K/uL   RBC 4.06 (L) 4.22 - 5.81 MIL/uL   Hemoglobin 11.7 (L) 13.0 - 17.0 g/dL   HCT 36.6 (L) 39 - 52 %   MCV 90.1 80.0 - 100.0 fL   MCH 28.8 26.0 - 34.0 pg   MCHC 32.0 30.0 - 36.0 g/dL   RDW 14.7 11.5 - 15.5 %   Platelets 285 150 - 400 K/uL   nRBC 0.0 0.0 - 0.2 %    Comment: Performed at [facility], [place] [address] [place], [place] [address]  Comprehensive metabolic panel     Status: Abnormal   Collection Time: 01/01/20  4:55 AM  Result Value Ref Range   Sodium 135 135 - 145 mmol/L   Potassium 4.0 3.5 - 5.1 mmol/L   Chloride 103 98 - 111 mmol/L   CO2 23 22 - 32 mmol/L   Glucose, Bld 118 (H) 70 - 99 mg/dL    Comment: Glucose reference range applies only to samples taken after fasting for at least 8 hours.   BUN 12 8 - 23 mg/dL   Creatinine, Ser 1.32 (H) 0.61 - 1.24 mg/dL   Calcium 8.0 (L) 8.9 - 10.3 mg/dL   Total Protein 4.9 (L) 6.5 - 8.1 g/dL   Albumin 2.8 (L) 3.5 - 5.0 g/dL   AST 20 15 - 41 U/L   ALT 14 0 - 44 U/L   Alkaline Phosphatase 28 (L) 38 - 126 U/L   Total Bilirubin 0.8 0.3 - 1.2 mg/dL   GFR, Estimated 59 (L) >60 mL/min    Comment: (NOTE) Calculated using the CKD-EPI Creatinine Equation (2021)    Anion gap 9 5 - 15    Comment: Performed at [facility], [place] [address], [place], [place] [address]  Lactic acid, plasma     Status: None   Collection Time: 01/01/20  4:55 AM  Result Value Ref Range   Lactic  Acid, Venous 1.0 0.5 - 1.9 mmol/L    Comment: Performed at [facility] [address] [place], [place] [address]  Type and screen [place]     Status: None   Collection Time: 01/01/20  4:55 AM  Result Value Ref Range   ABO/RH(D) AB POS    Antibody Screen NEG    Sample Expiration      01/04/2020,2359 Performed at [facility], [place] [address] [place], [place] [address]   Hemoglobin A1c     Status: Abnormal   Collection Time: 01/01/20  4:55 AM  Result Value Ref Range   Hgb A1c MFr Bld 11.8 (H) 4.8 - 5.6 %    Comment: (NOTE) Pre diabetes:          5.7%-6.4%  Diabetes:              >6.4%  Glycemic control for   <7.0% adults  with diabetes    Mean Plasma Glucose 291.96 mg/dL    Comment: Performed at [facility], [place] [address], [place], [place] [address]  CBC     Status: Abnormal   Collection Time: 01/01/20  7:09 AM  Result Value Ref Range   WBC 12.7 (H) 4.0 - 10.5 K/uL   RBC 4.19 (L) 4.22 - 5.81 MIL/uL   Hemoglobin 12.2 (L) 13.0 - 17.0 g/dL   HCT 38.1 (L) 39 - 52 %   MCV 90.9 80.0 - 100.0 fL   MCH 29.1 26.0 - 34.0 pg   MCHC 32.0 30.0 - 36.0 g/dL   RDW 14.8 11.5 - 15.5 %   Platelets 304 150 - 400 K/uL   nRBC 0.0 0.0 - 0.2 %    Comment: Performed at [facility], [place] [address] [place], [place] [address]  Lactic acid, plasma     Status: None   Collection Time: 01/01/20  7:09 AM  Result Value Ref Range   Lactic Acid, Venous 0.7 0.5 - 1.9 mmol/L    Comment: Performed at [facility] [address] [place], [place] [address]  Glucose, capillary     Status: Abnormal   Collection Time: 01/01/20  7:23 AM  Result Value Ref Range   Glucose-Capillary 135 (H) 70 - 99 mg/dL    Comment: Glucose reference range applies only to samples taken after fasting for at least 8 hours.    DG Lumbar Spine Complete  Result Date: 12/31/2019 CLINICAL DATA:  Hit by tractor EXAM: LUMBAR SPINE - COMPLETE 4+ VIEW COMPARISON:  02/22/2017  FINDINGS: Mild straightening of the lumbar spine. Posterior rods, fixating screws and interbody device at L5-S1. Small metallic fragment posterior to the lower spinal rods without change. Vertebral body heights are maintained. Aortic atherosclerosis. Mild degenerative changes at L2-L3. IMPRESSION: Postsurgical changes at L5-S1. No acute osseous abnormality. Electronically Signed   By: Donavan Foil M.D.   On: 12/31/2019 20:04   CT HEAD WO CONTRAST  Result Date: 12/31/2019 CLINICAL DATA:  Head trauma EXAM: CT HEAD WITHOUT CONTRAST TECHNIQUE: Contiguous axial images were obtained from the base of the skull through the vertex without intravenous contrast. COMPARISON:  CT brain 05/20/2017 FINDINGS: Brain: No acute territorial infarction, hemorrhage, or intracranial mass is visualized. There is mild atrophy. Stable ventricle size. Vascular: No hyperdense vessels.  Carotid vascular calcification. Skull: Normal. Negative for fracture or focal lesion. Sinuses/Orbits: No acute finding. Other: None IMPRESSION: 1. No CT evidence for acute intracranial abnormality. 2. Mild atrophy. Electronically Signed   By: Donavan Foil M.D.   On: 12/31/2019 20:18   CT PELVIS WO CONTRAST  Result Date: 01/01/2020 CLINICAL DATA:  Pelvic fracture. EXAM: CT PELVIS WITHOUT CONTRAST TECHNIQUE: Multidetector CT imaging of the pelvis was performed following the standard protocol without intravenous contrast. COMPARISON:  CT dated 09/22/2017. X-rays from the same day. CT dated Jun 15, 2019. FINDINGS: Urinary Tract:  No abnormality visualized. Bowel:  Unremarkable visualized pelvic bowel loops. Vascular/Lymphatic: There are atherosclerotic changes of the visualized distal abdominal aorta. There are no concerning pathologically enlarged lymph nodes. Reproductive:  No mass or other significant abnormality Other:  None. Musculoskeletal: There is a hematoma overlying the greater trochanter of the left hip that measures approximately 7.6 x 3.5 cm.  There is a small volume pelvic hematoma surrounding the urinary bladder. There is an acute, nondisplaced fracture through the right posterosuperior iliac crest (axial series 4, image 12). There is a probable nondisplaced fracture through  the right L5 transverse process. There is a probable nondisplaced fracture through the right superior hemi sacrum (axial series 4, image 14), not well evaluated secondary to streak artifact. There are minimally displaced fractures involving the bilateral inferior pubic rami and the left parasymphyseal pubic bone. The patient is status post prior total hip arthroplasty on the right. The hardware appears grossly intact where visualized. There are degenerative changes of the left hip without evidence for an acute displaced fracture. The patient is status post prior L5-S1 posterior fusion. The hardware is grossly intact. IMPRESSION: 1. There are multiple acute fractures of the pelvic bones as detailed above. Most are minimally displaced or nondisplaced. 2. There is a 7.6 cm hematoma overlying the left hip. No evidence for an acute displaced fracture of the proximal left femur. 3. Small volume pelvic hematoma. Aortic Atherosclerosis (ICD10-I70.0). Electronically Signed   By: Constance Holster M.D.   On: 01/01/2020 00:09   DG HIP UNILAT W OR W/O PELVIS 2-3 VIEWS LEFT  Result Date: 12/31/2019 CLINICAL DATA:  Hit by tractor EXAM: DG HIP (WITH OR WITHOUT PELVIS) 2-3V LEFT COMPARISON:  12/19/2017, CT 06/15/2019 FINDINGS: Possible acute nondisplaced fracture involving the left inferior pubic ramus. Left femoral head projects in joint. No fracture or dislocation at the left hip. IMPRESSION: Possible acute nondisplaced fracture involving the left inferior pubic ramus. Electronically Signed   By: Donavan Foil M.D.   On: 12/31/2019 20:07   DG HIP UNILAT W OR W/O PELVIS 2-3 VIEWS RIGHT  Result Date: 12/31/2019 CLINICAL DATA:  Hit by tractor EXAM: DG HIP (WITH OR WITHOUT PELVIS) 2-3V RIGHT  COMPARISON:  05/20/2017 FINDINGS: Surgical hardware in the lumbosacral spine. Pubic symphysis appears intact. Probable acute nondisplaced fracture involving the left inferior pubic ramus. Status post right hip arthroplasty with intact hardware and normal alignment. IMPRESSION: Probable acute nondisplaced fracture involving the left inferior pubic ramus. Right hip replacement without acute osseous abnormality. Electronically Signed   By: Donavan Foil M.D.   On: 12/31/2019 20:09    Review of Systems  HENT: Negative for ear discharge, ear pain, hearing loss and tinnitus.   Eyes: Negative for photophobia and pain.  Respiratory: Negative for cough and shortness of breath.   Cardiovascular: Negative for chest pain.  Gastrointestinal: Negative for abdominal pain, nausea and vomiting.  Genitourinary: Negative for dysuria, flank pain, frequency and urgency.  Musculoskeletal: Positive for arthralgias (Pelvis) and back pain. Negative for myalgias and neck pain.  Neurological: Negative for dizziness and headaches.  Hematological: Does not bruise/bleed easily.  Psychiatric/Behavioral: The patient is not nervous/anxious.    Blood pressure 110/80, pulse 72, temperature 98.1 F (36.7 C), resp. rate 20, height 6' (1.829 m), weight 103.4 kg, SpO2 93 %. Physical Exam Constitutional:      General: He is not in acute distress.    Appearance: He is well-developed. He is not diaphoretic.  HENT:     Head: Normocephalic and atraumatic.  Eyes:     General: No scleral icterus.       Right eye: No discharge.        Left eye: No discharge.     Conjunctiva/sclera: Conjunctivae normal.  Cardiovascular:     Rate and Rhythm: Normal rate and regular rhythm.  Pulmonary:     Effort: Pulmonary effort is normal. No respiratory distress.  Musculoskeletal:     Cervical back: Normal range of motion.     Comments: BLE No traumatic wounds, ecchymosis, or rash  Severe TTP pelvis  No knee or  ankle effusion  Knee stable to  varus/ valgus and anterior/posterior stress  Sens DPN, SPN, TN intact  Motor EHL, ext, flex, evers 5/5  DP 2+, PT 0, No significant edema  Skin:    General: Skin is warm and dry.  Neurological:     Mental Status: He is alert.  Psychiatric:        Behavior: Behavior normal.     Assessment/Plan: Pelvic fxs -- He may be WBAT LLE and 50% WB RLE. If his pain can be controlled well enough to ambulate may be able to get by with non-operative treatment. F/u with Dr. Marcelino Scot at discharge. Multiple medical problems including A. 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

## 2020-01-02 ENCOUNTER — Inpatient Hospital Stay (HOSPITAL_COMMUNITY): Payer: Medicare PPO

## 2020-01-02 DIAGNOSIS — G4730 Sleep apnea, unspecified: Secondary | ICD-10-CM

## 2020-01-02 DIAGNOSIS — Z955 Presence of coronary angioplasty implant and graft: Secondary | ICD-10-CM

## 2020-01-02 DIAGNOSIS — K8500 Idiopathic acute pancreatitis without necrosis or infection: Secondary | ICD-10-CM

## 2020-01-02 DIAGNOSIS — N1831 Chronic kidney disease, stage 3a: Secondary | ICD-10-CM

## 2020-01-02 DIAGNOSIS — I10 Essential (primary) hypertension: Secondary | ICD-10-CM

## 2020-01-02 LAB — BASIC METABOLIC PANEL
Anion gap: 7 (ref 5–15)
BUN: 10 mg/dL (ref 8–23)
CO2: 26 mmol/L (ref 22–32)
Calcium: 8.3 mg/dL — ABNORMAL LOW (ref 8.9–10.3)
Chloride: 103 mmol/L (ref 98–111)
Creatinine, Ser: 1.21 mg/dL (ref 0.61–1.24)
GFR, Estimated: >60 mL/min (ref >60)
Glucose, Bld: 153 mg/dL — ABNORMAL HIGH (ref 70–99)
Potassium: 4.2 mmol/L (ref 3.5–5.1)
Sodium: 136 mmol/L (ref 135–145)

## 2020-01-02 LAB — URINALYSIS, ROUTINE W REFLEX MICROSCOPIC
Bilirubin Urine: NEGATIVE
Glucose, UA: >=500 mg/dL — AB
Ketones, ur: NEGATIVE mg/dL
Nitrite: NEGATIVE
Protein, ur: NEGATIVE mg/dL
Specific Gravity, Urine: 1.02 (ref 1.005–1.030)
pH: 5 (ref 5.0–8.0)

## 2020-01-02 LAB — CBC WITH DIFFERENTIAL/PLATELET
Abs Immature Granulocytes: 0.06 10*3/uL (ref 0.00–0.07)
Basophils Absolute: 0.1 10*3/uL (ref 0.0–0.1)
Basophils Relative: 0 %
Eosinophils Absolute: 0.6 10*3/uL — ABNORMAL HIGH (ref 0.0–0.5)
Eosinophils Relative: 5 %
HCT: 35.8 % — ABNORMAL LOW (ref 39.0–52.0)
Hemoglobin: 11.7 g/dL — ABNORMAL LOW (ref 13.0–17.0)
Immature Granulocytes: 1 %
Lymphocytes Relative: 15 %
Lymphs Abs: 1.7 10*3/uL (ref 0.7–4.0)
MCH: 30.2 pg (ref 26.0–34.0)
MCHC: 32.7 g/dL (ref 30.0–36.0)
MCV: 92.5 fL (ref 80.0–100.0)
Monocytes Absolute: 1.2 10*3/uL — ABNORMAL HIGH (ref 0.1–1.0)
Monocytes Relative: 11 %
Neutro Abs: 7.8 10*3/uL — ABNORMAL HIGH (ref 1.7–7.7)
Neutrophils Relative %: 68 %
Platelets: 256 10*3/uL (ref 150–400)
RBC: 3.87 MIL/uL — ABNORMAL LOW (ref 4.22–5.81)
RDW: 14.9 % (ref 11.5–15.5)
WBC: 11.5 10*3/uL — ABNORMAL HIGH (ref 4.0–10.5)
nRBC: 0 % (ref 0.0–0.2)

## 2020-01-02 LAB — GLUCOSE, CAPILLARY
Glucose-Capillary: 144 mg/dL — ABNORMAL HIGH (ref 70–99)
Glucose-Capillary: 158 mg/dL — ABNORMAL HIGH (ref 70–99)
Glucose-Capillary: 241 mg/dL — ABNORMAL HIGH (ref 70–99)
Glucose-Capillary: 273 mg/dL — ABNORMAL HIGH (ref 70–99)
Glucose-Capillary: 284 mg/dL — ABNORMAL HIGH (ref 70–99)
Glucose-Capillary: 289 mg/dL — ABNORMAL HIGH (ref 70–99)

## 2020-01-02 LAB — MAGNESIUM: Magnesium: 1.3 mg/dL — ABNORMAL LOW (ref 1.7–2.4)

## 2020-01-02 MED ORDER — KETOCONAZOLE 2 % EX CREA
TOPICAL_CREAM | Freq: Two times a day (BID) | CUTANEOUS | Status: DC
  Administered 2020-01-02: 1 via TOPICAL
  Filled 2020-01-02: qty 15

## 2020-01-02 MED ORDER — ALBUTEROL SULFATE HFA 108 (90 BASE) MCG/ACT IN AERS
2.0000 | INHALATION_SPRAY | RESPIRATORY_TRACT | Status: DC | PRN
  Filled 2020-01-02: qty 6.7

## 2020-01-02 MED ORDER — MAGNESIUM SULFATE 2 GM/50ML IV SOLN
2.0000 g | Freq: Once | INTRAVENOUS | Status: AC
  Administered 2020-01-02: 2 g via INTRAVENOUS
  Filled 2020-01-02: qty 50

## 2020-01-02 MED ORDER — INSULIN ASPART 100 UNIT/ML ~~LOC~~ SOLN: 0.0000 [IU] | Freq: Every day | SUBCUTANEOUS | Status: DC

## 2020-01-02 MED ORDER — INSULIN GLARGINE 100 UNIT/ML ~~LOC~~ SOLN
12.0000 [IU] | Freq: Every day | SUBCUTANEOUS | Status: DC
  Administered 2020-01-02 – 2020-01-04 (×3): 12 [IU] via SUBCUTANEOUS
  Filled 2020-01-02 (×3): qty 0.12

## 2020-01-02 MED ORDER — INSULIN ASPART 100 UNIT/ML ~~LOC~~ SOLN
0.0000 [IU] | Freq: Every day | SUBCUTANEOUS | Status: DC
  Administered 2020-01-02: 2 [IU] via SUBCUTANEOUS
  Administered 2020-01-03 – 2020-01-04 (×2): 3 [IU] via SUBCUTANEOUS
  Administered 2020-01-05 – 2020-01-06 (×2): 2 [IU] via SUBCUTANEOUS

## 2020-01-02 MED ORDER — OXYCODONE HCL 5 MG PO TABS
10.0000 mg | ORAL_TABLET | ORAL | Status: DC | PRN
  Administered 2020-01-02 – 2020-01-07 (×22): 10 mg via ORAL
  Filled 2020-01-02 (×22): qty 2

## 2020-01-02 MED ORDER — INSULIN ASPART 100 UNIT/ML ~~LOC~~ SOLN
0.0000 [IU] | Freq: Three times a day (TID) | SUBCUTANEOUS | Status: DC
  Administered 2020-01-02: 5 [IU] via SUBCUTANEOUS
  Administered 2020-01-03: 3 [IU] via SUBCUTANEOUS
  Administered 2020-01-03: 2 [IU] via SUBCUTANEOUS
  Administered 2020-01-03: 3 [IU] via SUBCUTANEOUS
  Administered 2020-01-04: 2 [IU] via SUBCUTANEOUS
  Administered 2020-01-04: 5 [IU] via SUBCUTANEOUS
  Administered 2020-01-04 – 2020-01-05 (×2): 3 [IU] via SUBCUTANEOUS
  Administered 2020-01-05: 12:00:00 5 [IU] via SUBCUTANEOUS
  Administered 2020-01-05: 18:00:00 3 [IU] via SUBCUTANEOUS
  Administered 2020-01-06 (×2): 5 [IU] via SUBCUTANEOUS
  Administered 2020-01-06: 09:00:00 2 [IU] via SUBCUTANEOUS
  Administered 2020-01-07: 18:00:00 3 [IU] via SUBCUTANEOUS
  Administered 2020-01-07: 12:00:00 5 [IU] via SUBCUTANEOUS
  Administered 2020-01-07: 10:00:00 3 [IU] via SUBCUTANEOUS

## 2020-01-02 NOTE — Progress Notes (Signed)
Inpatient Diabetes Program Recommendations  AACE/ADA: New Consensus Statement on Inpatient Glycemic Control (2015)  Target Ranges:  Prepandial:   less than 140 mg/dL      Peak postprandial:   less than 180 mg/dL (1-2 hours)      Critically ill patients:  140 - 180 mg/dL   Lab Results  Component Value Date   GLUCAP 284 (H) 01/02/2020   HGBA1C 11.8 (H) 01/01/2020    Review of Glycemic Control Results for Steve Anthony, Steve Anthony (MRN [id number]) as of 01/02/2020 12:48  Ref. Range 01/01/2020 19:57 01/01/2020 23:49 01/02/2020 03:43 01/02/2020 07:37 01/02/2020 11:19  Glucose-Capillary Latest Ref Range: 70 - 99 mg/dL 245 (H) 182 (H) 144 (H) 158 (H) 284 (H)   Diabetes history: DM2 Outpatient Diabetes medications: Lantus 24 units qd + Januvia 50 mg qd Current orders for Inpatient glycemic control: Novolog 0-9 units q 4hrs.  Inpatient Diabetes Program Recommendations:   -Change diet to carb modified -Add 50% home Lantus insulin =12 units -Change Novolog correction to tid + hs 0-5 since patient is now eating Secure chat sent to Dr. Doristine Bosworth.  Thank you, Nani Gasser. Doshia Dalia, RN, MSN, CDE  Diabetes Coordinator Inpatient Glycemic Control Team Team Pager [phone] (8am-5pm) 01/02/2020 12:51 PM

## 2020-01-02 NOTE — Progress Notes (Signed)
PROGRESS NOTE    Steve Anthony  IHW:[id number] DOB: 1950-08-04 DOA: 12/31/2019 PCP: [name], MD   Brief Narrative:  Patient is 69 year old male with past medical history of A. fib-on Eliquis, coronary artery disease status post stent placement, history of idiopathic pancreatitis status post distal pancreatectomy and splenectomy, chronic anemia, COPD, prediabetes, chronic pain syndrome-on chronic opioids presents to emergency department with trauma from home.  He was working on a tractor and the tractor rolled on him and knocked him off.  He was found with multiple pelvic bone fractures and hematoma.  ED course: In the ER x-ray revealed pelvic fracture this was further confirmed with CT pelvis which show 7.6 cm hematoma overlying the left hip.  There was also a small amount of pelvic hematoma.  Patient's labs are significant for acute renal failure and hemoglobin was found 12.5, WBC of 15.7.  CT head was negative for acute findings.  Orthopedic surgery consulted.  Patient admitted for further evaluation and management for pelvic fracture and hematoma.  Assessment & Plan:   Multiple comminuted nondisplaced pelvic fracture: -Orthopedic surgeon consulted recommended medical management for now. -Continue pain control as needed. -PT/OT as per orthopedic recommendations. -Continue to monitor H&H and vitals closely. -Anticipate SNF versus rehab placement  Pelvic hematoma: -Continue to hold Eliquis.  Monitor H&H closely. -Continue with pain control.  Paroxysmal A. fib on Eliquis: Continue to hold Eliquis due to hematoma of the pelvis. -Continue amiodarone and metoprolol.  Monitor heart rate closely on telemetry.  Coronary artery disease status post stenting: Patient denies ACS symptoms. -Continue fenofibrate, metoprolol  Hypertension: Stable.  Continue metoprolol.  History of idiopathic pancreatitis: Status post distal appendectomy and splenectomy.  Depression: Continue Lexapro,  trazodone  Hyperlipidemia: Continue fenofibrate  GERD: Continue PPI  BPH: Continue Flomax  Chronic pain syndrome-on chronic opioids outpatient: Continue oxycodone 10 mg every 4 hours as needed and Zanaflex.  COPD: On 2 L via nasal cannula.  Continue home inhalers.  No wheezing noted on exam.  Hypomagnesemia: Replenished.  Repeat magnesium level tomorrow AM.  Leukocytosis: Likely reactive.  Continue to monitor.  Check UA.  Intertrigo: Check UA.  Start on antifungal  DVT prophylaxis: SCD Code Status: Full code Family Communication: Patient's wife present at bedside.  Plan of care discussed with patient in length and he verbalized understanding and agreed with it. Disposition Plan: SNF versus rehab  Consultants:  Orthopedic surgery Procedures:   CT pelvis  Antimicrobials:   None  Status is: Inpatient  Dispo: The patient is from: Home              Anticipated d/c is to: SNF              Anticipated d/c date is: 2 days              Patient currently is not medically stable to d/c.    Subjective: Patient seen and examined.  Wife at bedside.  Continues to have pelvic pain.  Tells me that he is unable to move due to pain and requested for pain medications.  Complaining of bruise on his left hip which is getting worse.  Complaining of rash in his groin which is itchy.  Objective: Vitals:   01/01/20 2344 01/02/20 0343 01/02/20 0738 01/02/20 1108  BP: 115/75 112/75 115/81 104/68  Pulse: 61 66 69 60  Resp: 20 20 16 14   Temp: 98.4 F (36.9 C) 98.1 F (36.7 C) 98 F (36.7 C) 98.2 F (36.8 C)  TempSrc: Axillary Axillary Oral Oral  SpO2: 95% 96% 94% 90%  Weight:      Height:        Intake/Output Summary (Last 24 hours) at 01/02/2020 1202 Last data filed at 01/02/2020 1109 Gross per 24 hour  Intake 2906.07 ml  Output 1550 ml  Net 1356.07 ml   Filed Weights   12/31/19 1855  Weight: 103.4 kg    Examination:  General exam: Appears calm and comfortable, on 2 L of  oxygen via nasal cannula, elderly, communicating well, in pain Respiratory system: Clear to auscultation. Respiratory effort normal. Cardiovascular system: S1 & S2 heard, RRR. No JVD, murmurs, rubs, gallops or clicks. No pedal edema. Gastrointestinal system: Abdomen is nondistended, soft and nontender. No organomegaly or masses felt. Normal bowel sounds heard. Central nervous system: Alert and oriented. No focal neurological deficits. Extremities: Symmetric 5 x 5 power. Skin: Large bruise noted on left upper and lateral side of hip.  Mildly erythematous rash noted in groin area. Psychiatry: Judgement and insight appear normal. Mood & affect appropriate.    Data Reviewed: I have personally reviewed following labs and imaging studies  CBC: Recent Labs  Lab 12/31/19 2125 01/01/20 0455 01/01/20 0709 01/02/20 0420  WBC 15.7* 11.7* 12.7* 11.5*  NEUTROABS  --   --   --  7.8*  HGB 12.5* 11.7* 12.2* 11.7*  HCT 39.8 36.6* 38.1* 35.8*  MCV 91.9 90.1 90.9 92.5  PLT 324 285 304 952   Basic Metabolic Panel: Recent Labs  Lab 12/31/19 2125 01/01/20 0455 01/02/20 0420  NA 135 135 136  K 4.5 4.0 4.2  CL 101 103 103  CO2 24 23 26   GLUCOSE 164* 118* 153*  BUN 12 12 10   CREATININE 1.44* 1.32* 1.21  CALCIUM 8.5* 8.0* 8.3*  MG  --   --  1.3*   GFR: Estimated Creatinine Clearance: 72.6 mL/min (by C-G formula based on SCr of 1.21 mg/dL). Liver Function Tests: Recent Labs  Lab 01/01/20 0455  AST 20  ALT 14  ALKPHOS 28*  BILITOT 0.8  PROT 4.9*  ALBUMIN 2.8*   No results for input(s): LIPASE, AMYLASE in the last 168 hours. No results for input(s): AMMONIA in the last 168 hours. Coagulation Profile: No results for input(s): INR, PROTIME in the last 168 hours. Cardiac Enzymes: No results for input(s): CKTOTAL, CKMB, CKMBINDEX, TROPONINI in the last 168 hours. BNP (last 3 results) No results for input(s): PROBNP in the last 8760 hours. HbA1C: Recent Labs    01/01/20 0455  HGBA1C  11.8*   CBG: Recent Labs  Lab 01/01/20 1957 01/01/20 2349 01/02/20 0343 01/02/20 0737 01/02/20 1119  GLUCAP 245* 182* 144* 158* 284*   Lipid Profile: No results for input(s): CHOL, HDL, LDLCALC, TRIG, CHOLHDL, LDLDIRECT in the last 72 hours. Thyroid Function Tests: No results for input(s): TSH, T4TOTAL, FREET4, T3FREE, THYROIDAB in the last 72 hours. Anemia Panel: No results for input(s): VITAMINB12, FOLATE, FERRITIN, TIBC, IRON, RETICCTPCT in the last 72 hours. Sepsis Labs: Recent Labs  Lab 01/01/20 0455 01/01/20 0709  LATICACIDVEN 1.0 0.7    Recent Results (from the past 240 hour(s))  Resp Panel by RT-PCR (Flu A&B, Covid) Nasopharyngeal Swab     Status: None   Collection Time: 12/31/19  9:25 PM   Specimen: Nasopharyngeal Swab; Nasopharyngeal(NP) swabs in vial transport medium  Result Value Ref Range Status   SARS Coronavirus 2 by RT PCR NEGATIVE NEGATIVE Final    Comment: (NOTE) SARS-CoV-2 target nucleic acids are NOT  DETECTED.  The SARS-CoV-2 RNA is generally detectable in upper respiratory specimens during the acute phase of infection. The lowest concentration of SARS-CoV-2 viral copies this assay can detect is 138 copies/mL. A negative result does not preclude SARS-Cov-2 infection and should not be used as the sole basis for treatment or other patient management decisions. A negative result may occur with  improper specimen collection/handling, submission of specimen other than nasopharyngeal swab, presence of viral mutation(s) within the areas targeted by this assay, and inadequate number of viral copies(<138 copies/mL). A negative result must be combined with clinical observations, patient history, and epidemiological information. The expected result is Negative.  Fact Sheet for Patients:  EntrepreneurPulse.com.au  Fact Sheet for Healthcare Providers:  IncredibleEmployment.be  This test is no t yet approved or cleared by  the [place] FDA and  has been authorized for detection and/or diagnosis of SARS-CoV-2 by FDA under an Emergency Use Authorization (EUA). This EUA will remain  in effect (meaning this test can be used) for the duration of the COVID-19 declaration under Section 564(b)(1) of the Act, 21 U.S.C.section 360bbb-3(b)(1), unless the authorization is terminated  or revoked sooner.       Influenza A by PCR NEGATIVE NEGATIVE Final   Influenza B by PCR NEGATIVE NEGATIVE Final    Comment: (NOTE) The Xpert Xpress SARS-CoV-2/FLU/RSV plus assay is intended as an aid in the diagnosis of influenza from Nasopharyngeal swab specimens and should not be used as a sole basis for treatment. Nasal washings and aspirates are unacceptable for Xpert Xpress SARS-CoV-2/FLU/RSV testing.  Fact Sheet for Patients: EntrepreneurPulse.com.au  Fact Sheet for Healthcare Providers: IncredibleEmployment.be  This test is not yet approved or cleared by the [place] FDA and has been authorized for detection and/or diagnosis of SARS-CoV-2 by FDA under an Emergency Use Authorization (EUA). This EUA will remain in effect (meaning this test can be used) for the duration of the COVID-19 declaration under Section 564(b)(1) of the Act, 21 U.S.C. section 360bbb-3(b)(1), unless the authorization is terminated or revoked.  Performed at [facility], [place] [address], [place], [place] [address]   MRSA PCR Screening     Status: None   Collection Time: 01/01/20  4:09 AM   Specimen: Nasal Mucosa; Nasopharyngeal  Result Value Ref Range Status   MRSA by PCR NEGATIVE NEGATIVE Final    Comment:        The GeneXpert MRSA Assay (FDA approved for NASAL specimens only), is one component of a comprehensive MRSA colonization surveillance program. It is not intended to diagnose MRSA infection nor to guide or monitor treatment for MRSA infections. Performed at [facility],  [place] [address], [place], [place] [address]       Radiology Studies: DG Lumbar Spine Complete  Result Date: 12/31/2019 CLINICAL DATA:  Hit by tractor EXAM: LUMBAR SPINE - COMPLETE 4+ VIEW COMPARISON:  02/22/2017 FINDINGS: Mild straightening of the lumbar spine. Posterior rods, fixating screws and interbody device at L5-S1. Small metallic fragment posterior to the lower spinal rods without change. Vertebral body heights are maintained. Aortic atherosclerosis. Mild degenerative changes at L2-L3. IMPRESSION: Postsurgical changes at L5-S1. No acute osseous abnormality. Electronically Signed   By: Donavan Foil M.D.   On: 12/31/2019 20:04   CT HEAD WO CONTRAST  Result Date: 12/31/2019 CLINICAL DATA:  Head trauma EXAM: CT HEAD WITHOUT CONTRAST TECHNIQUE: Contiguous axial images were obtained from the base of the skull through the vertex without intravenous contrast. COMPARISON:  CT brain 05/20/2017  FINDINGS: Brain: No acute territorial infarction, hemorrhage, or intracranial mass is visualized. There is mild atrophy. Stable ventricle size. Vascular: No hyperdense vessels.  Carotid vascular calcification. Skull: Normal. Negative for fracture or focal lesion. Sinuses/Orbits: No acute finding. Other: None IMPRESSION: 1. No CT evidence for acute intracranial abnormality. 2. Mild atrophy. Electronically Signed   By: Donavan Foil M.D.   On: 12/31/2019 20:18   CT PELVIS WO CONTRAST  Result Date: 01/01/2020 CLINICAL DATA:  Pelvic fracture. EXAM: CT PELVIS WITHOUT CONTRAST TECHNIQUE: Multidetector CT imaging of the pelvis was performed following the standard protocol without intravenous contrast. COMPARISON:  CT dated 09/22/2017. X-rays from the same day. CT dated Jun 15, 2019. FINDINGS: Urinary Tract:  No abnormality visualized. Bowel:  Unremarkable visualized pelvic bowel loops. Vascular/Lymphatic: There are atherosclerotic changes of the visualized distal abdominal aorta. There are no concerning pathologically  enlarged lymph nodes. Reproductive:  No mass or other significant abnormality Other:  None. Musculoskeletal: There is a hematoma overlying the greater trochanter of the left hip that measures approximately 7.6 x 3.5 cm. There is a small volume pelvic hematoma surrounding the urinary bladder. There is an acute, nondisplaced fracture through the right posterosuperior iliac crest (axial series 4, image 12). There is a probable nondisplaced fracture through the right L5 transverse process. There is a probable nondisplaced fracture through the right superior hemi sacrum (axial series 4, image 14), not well evaluated secondary to streak artifact. There are minimally displaced fractures involving the bilateral inferior pubic rami and the left parasymphyseal pubic bone. The patient is status post prior total hip arthroplasty on the right. The hardware appears grossly intact where visualized. There are degenerative changes of the left hip without evidence for an acute displaced fracture. The patient is status post prior L5-S1 posterior fusion. The hardware is grossly intact. IMPRESSION: 1. There are multiple acute fractures of the pelvic bones as detailed above. Most are minimally displaced or nondisplaced. 2. There is a 7.6 cm hematoma overlying the left hip. No evidence for an acute displaced fracture of the proximal left femur. 3. Small volume pelvic hematoma. Aortic Atherosclerosis (ICD10-I70.0). Electronically Signed   By: Constance Holster M.D.   On: 01/01/2020 00:09   DG Pelvis Comp Min 3V  Result Date: 01/02/2020 CLINICAL DATA:  Pelvic ring fracture, trauma. EXAM: JUDET PELVIS - 3+ VIEW COMPARISON:  CT 12/31/2019 FINDINGS: Previously seen pelvic fractures by CT difficult to visualize by plain film. The inferior pubic rami fractures are visualized. Other described fractures not visualized. Prior right hip replacement.  No hardware complicating feature. IMPRESSION: Previously seen pelvic fractures by CT difficult  to visualize, better evaluated by CT. Electronically Signed   By: Rolm Baptise M.D.   On: 01/02/2020 10:30   DG HIP UNILAT W OR W/O PELVIS 2-3 VIEWS LEFT  Result Date: 12/31/2019 CLINICAL DATA:  Hit by tractor EXAM: DG HIP (WITH OR WITHOUT PELVIS) 2-3V LEFT COMPARISON:  12/19/2017, CT 06/15/2019 FINDINGS: Possible acute nondisplaced fracture involving the left inferior pubic ramus. Left femoral head projects in joint. No fracture or dislocation at the left hip. IMPRESSION: Possible acute nondisplaced fracture involving the left inferior pubic ramus. Electronically Signed   By: Donavan Foil M.D.   On: 12/31/2019 20:07   DG HIP UNILAT W OR W/O PELVIS 2-3 VIEWS RIGHT  Result Date: 12/31/2019 CLINICAL DATA:  Hit by tractor EXAM: DG HIP (WITH OR WITHOUT PELVIS) 2-3V RIGHT COMPARISON:  05/20/2017 FINDINGS: Surgical hardware in the lumbosacral spine. Pubic symphysis appears intact. Probable  acute nondisplaced fracture involving the left inferior pubic ramus. Status post right hip arthroplasty with intact hardware and normal alignment. IMPRESSION: Probable acute nondisplaced fracture involving the left inferior pubic ramus. Right hip replacement without acute osseous abnormality. Electronically Signed   By: Donavan Foil M.D.   On: 12/31/2019 20:09    Scheduled Meds: . amiodarone  200 mg Oral Daily  . escitalopram  10 mg Oral Daily  . fenofibrate  160 mg Oral Daily  . gabapentin  300 mg Oral BID  . insulin aspart  0-9 Units Subcutaneous Q4H  . melatonin  10 mg Oral QHS  . metoprolol tartrate  12.5 mg Oral BID  . montelukast  10 mg Oral Daily  . pantoprazole  40 mg Oral BID  . tamsulosin  0.4 mg Oral Daily  . tiZANidine  4 mg Oral BID  . traZODone  100 mg Oral QHS   Continuous Infusions:   LOS: 1 day   Time spent: 40 minutes   Coral Soler Loann Quill, MD [facility]  If 7PM-7AM, please contact night-coverage [email or web address] 01/02/2020, 12:02 PM

## 2020-01-02 NOTE — Progress Notes (Signed)
Patient doing better today with bed mobility and pain control with oral medication. Dressing was changed to the patients right arm. Patient is resting well no distress noted at this time. Will continue to monitor.

## 2020-01-03 DIAGNOSIS — K7460 Unspecified cirrhosis of liver: Secondary | ICD-10-CM

## 2020-01-03 DIAGNOSIS — J449 Chronic obstructive pulmonary disease, unspecified: Secondary | ICD-10-CM

## 2020-01-03 DIAGNOSIS — Z8781 Personal history of (healed) traumatic fracture: Secondary | ICD-10-CM

## 2020-01-03 LAB — BASIC METABOLIC PANEL
Anion gap: 9 (ref 5–15)
BUN: 10 mg/dL (ref 8–23)
CO2: 23 mmol/L (ref 22–32)
Calcium: 8.3 mg/dL — ABNORMAL LOW (ref 8.9–10.3)
Chloride: 103 mmol/L (ref 98–111)
Creatinine, Ser: 1.24 mg/dL (ref 0.61–1.24)
GFR, Estimated: >60 mL/min (ref >60)
Glucose, Bld: 216 mg/dL — ABNORMAL HIGH (ref 70–99)
Potassium: 4.4 mmol/L (ref 3.5–5.1)
Sodium: 135 mmol/L (ref 135–145)

## 2020-01-03 LAB — CBC
HCT: 31.9 % — ABNORMAL LOW (ref 39.0–52.0)
Hemoglobin: 10.5 g/dL — ABNORMAL LOW (ref 13.0–17.0)
MCH: 30.2 pg (ref 26.0–34.0)
MCHC: 32.9 g/dL (ref 30.0–36.0)
MCV: 91.7 fL (ref 80.0–100.0)
Platelets: 218 10*3/uL (ref 150–400)
RBC: 3.48 MIL/uL — ABNORMAL LOW (ref 4.22–5.81)
RDW: 14.7 % (ref 11.5–15.5)
WBC: 13.7 10*3/uL — ABNORMAL HIGH (ref 4.0–10.5)
nRBC: 0.1 % (ref 0.0–0.2)

## 2020-01-03 LAB — GLUCOSE, CAPILLARY
Glucose-Capillary: 174 mg/dL — ABNORMAL HIGH (ref 70–99)
Glucose-Capillary: 199 mg/dL — ABNORMAL HIGH (ref 70–99)
Glucose-Capillary: 210 mg/dL — ABNORMAL HIGH (ref 70–99)
Glucose-Capillary: 213 mg/dL — ABNORMAL HIGH (ref 70–99)
Glucose-Capillary: 218 mg/dL — ABNORMAL HIGH (ref 70–99)
Glucose-Capillary: 224 mg/dL — ABNORMAL HIGH (ref 70–99)
Glucose-Capillary: 252 mg/dL — ABNORMAL HIGH (ref 70–99)

## 2020-01-03 LAB — MAGNESIUM: Magnesium: 1.4 mg/dL — ABNORMAL LOW (ref 1.7–2.4)

## 2020-01-03 MED ORDER — CIPROFLOXACIN HCL 500 MG PO TABS
500.0000 mg | ORAL_TABLET | Freq: Two times a day (BID) | ORAL | Status: DC
  Administered 2020-01-03 – 2020-01-04 (×4): 500 mg via ORAL
  Filled 2020-01-03 (×5): qty 1

## 2020-01-03 MED ORDER — MAGNESIUM SULFATE 2 GM/50ML IV SOLN
2.0000 g | Freq: Once | INTRAVENOUS | Status: AC
  Administered 2020-01-03: 2 g via INTRAVENOUS
  Filled 2020-01-03: qty 50

## 2020-01-03 MED ORDER — APIXABAN 5 MG PO TABS
5.0000 mg | ORAL_TABLET | Freq: Two times a day (BID) | ORAL | Status: DC
  Administered 2020-01-03 – 2020-01-07 (×8): 5 mg via ORAL
  Filled 2020-01-03 (×8): qty 1

## 2020-01-03 NOTE — Progress Notes (Addendum)
PROGRESS NOTE    Steve Anthony  GUY:[id number] DOB: 09/05/1950 DOA: 12/31/2019 PCP: [name], MD   Brief Narrative:  Patient is 69 year old male with past medical history of A. fib-on Eliquis, coronary artery disease status post stent placement, history of idiopathic pancreatitis status post distal pancreatectomy and splenectomy, chronic anemia, COPD, prediabetes, chronic pain syndrome-on chronic opioids presents to emergency department with trauma from home.  He was working on a tractor and the tractor rolled on him and knocked him off.  He was found with multiple pelvic bone fractures and hematoma.  ED course: In the ER x-ray revealed pelvic fracture this was further confirmed with CT pelvis which show 7.6 cm hematoma overlying the left hip.  There was also a small amount of pelvic hematoma.  Patient's labs are significant for acute renal failure and hemoglobin was found 12.5, WBC of 15.7.  CT head was negative for acute findings.  Orthopedic surgery consulted.  Patient admitted for further evaluation and management for pelvic fracture and hematoma.  Assessment & Plan:   Multiple comminuted nondisplaced pelvic fracture: -Orthopedic surgeon consulted recommended medical management-WBAT LLE and 50% WB RLE and follow-up with Dr. Ulanda Edison outpatient. -Continue pain control as needed. -Continue to monitor H&H and vitals closely. -Consult PT/OT  Pelvic hematoma: -Reviewed CT pelvis. -Discussed with Ortho PA-resume Eliquis today.  Monitor H&H closely  Paroxysmal A. fib: Rate controlled.   -Continue metoprolol, amiodarone.  Resume Eliquis today.  Continue to monitor closely on telemetry.  Coronary artery disease status post stenting: Patient denies ACS symptoms. -Continue fenofibrate, metoprolol  Chronic pain syndrome-on chronic opioids outpatient: Continue oxycodone 10 mg every 4 hours as needed.  Discontinue Zanaflex due to interaction with ciprofloxacin.  COPD: On 2 L via nasal  cannula PRN.  Continue home inhalers.  No wheezing noted on exam.  UTI: Patient complaining of dysuria.  He is afebrile with leukocytosis.  UA positive for leukocyte, WBC, bacteria. -Patient is allergic to penicillin.  Discussed with pharmacy-based on previous culture result start patient on Cipro 500 twice daily for 7 days.  Urine culture is ordered.  No prolonged QT noted on recent EKG.  Hypertension: Stable.  Continue metoprolol.  History of idiopathic pancreatitis: Status post distal pancreatectomy and splenectomy.  Depression: Continue Lexapro, trazodone  Hyperlipidemia: Continue fenofibrate  GERD: Continue PPI  BPH: Continue Flomax  Hypomagnesemia: Replenished.  Repeat magnesium level tomorrow AM.  Intertrigo: Antifungal cream twice daily  DVT prophylaxis: SCD Code Status: Full code Family Communication: Patient's wife present at bedside.  Plan of care discussed with patient in length and he verbalized understanding and agreed with it. Disposition Plan: SNF versus rehab  Consultants:  Orthopedic surgery Procedures:   CT pelvis  Antimicrobials:   None  Status is: Inpatient  Dispo: The patient is from: Home              Anticipated d/c is to: SNF              Anticipated d/c date is: 2 days              Patient currently is not medically stable to d/c.    Subjective: Patient seen and examined.  Wife at bedside.  Continues to have pelvic pain.  Tells me that he has dysuria since yesterday.  He is allergic to penicillin and was hospitalized due to shortness of breath in the past.  Denies chest pain, shortness of breath, palpitation, fever, chills, nausea, vomiting or lower abdominal pain.  Objective: Vitals:   01/03/20 0005 01/03/20 0320 01/03/20 0742 01/03/20 1226  BP: 106/70 104/66 129/80 93/63  Pulse: 60 61 71 (!) 56  Resp: 18 18 19 17   Temp:  98.6 F (37 C)  98 F (36.7 C)  TempSrc:  Oral  Oral  SpO2: 94% 94% 90% 92%  Weight:      Height:         Intake/Output Summary (Last 24 hours) at 01/03/2020 1243 Last data filed at 01/03/2020 0830 Gross per 24 hour  Intake 740 ml  Output 1775 ml  Net -1035 ml   Filed Weights   12/31/19 1855  Weight: 103.4 kg    Examination: General exam: Appears calm and comfortable, elderly, on room air, communicating well Respiratory system: Clear to auscultation. Respiratory effort normal. Cardiovascular system: S1 & S2 heard, RRR. No JVD, murmurs, rubs, gallops or clicks. No pedal edema. Gastrointestinal system: Abdomen is nondistended, soft and nontender. No organomegaly or masses felt. Normal bowel sounds heard. Central nervous system: Alert and oriented. No focal neurological deficits. Extremities: Large bruise noted on left upper lateral side of thigh. Psychiatry: Judgement and insight appear normal. Mood & affect appropriate.   Data Reviewed: I have personally reviewed following labs and imaging studies  CBC: Recent Labs  Lab 12/31/19 2125 01/01/20 0455 01/01/20 0709 01/02/20 0420 01/03/20 0122  WBC 15.7* 11.7* 12.7* 11.5* 13.7*  NEUTROABS  --   --   --  7.8*  --   HGB 12.5* 11.7* 12.2* 11.7* 10.5*  HCT 39.8 36.6* 38.1* 35.8* 31.9*  MCV 91.9 90.1 90.9 92.5 91.7  PLT 324 285 304 256 254   Basic Metabolic Panel: Recent Labs  Lab 12/31/19 2125 01/01/20 0455 01/02/20 0420 01/03/20 0122  NA 135 135 136 135  K 4.5 4.0 4.2 4.4  CL 101 103 103 103  CO2 24 23 26 23   GLUCOSE 164* 118* 153* 216*  BUN 12 12 10 10   CREATININE 1.44* 1.32* 1.21 1.24  CALCIUM 8.5* 8.0* 8.3* 8.3*  MG  --   --  1.3* 1.4*   GFR: Estimated Creatinine Clearance: 70.9 mL/min (by C-G formula based on SCr of 1.24 mg/dL). Liver Function Tests: Recent Labs  Lab 01/01/20 0455  AST 20  ALT 14  ALKPHOS 28*  BILITOT 0.8  PROT 4.9*  ALBUMIN 2.8*   No results for input(s): LIPASE, AMYLASE in the last 168 hours. No results for input(s): AMMONIA in the last 168 hours. Coagulation Profile: No results  for input(s): INR, PROTIME in the last 168 hours. Cardiac Enzymes: No results for input(s): CKTOTAL, CKMB, CKMBINDEX, TROPONINI in the last 168 hours. BNP (last 3 results) No results for input(s): PROBNP in the last 8760 hours. HbA1C: Recent Labs    01/01/20 0455  HGBA1C 11.8*   CBG: Recent Labs  Lab 01/02/20 2331 01/03/20 0015 01/03/20 0354 01/03/20 0828 01/03/20 1223  GLUCAP 241* 210* 174* 224* 199*   Lipid Profile: No results for input(s): CHOL, HDL, LDLCALC, TRIG, CHOLHDL, LDLDIRECT in the last 72 hours. Thyroid Function Tests: No results for input(s): TSH, T4TOTAL, FREET4, T3FREE, THYROIDAB in the last 72 hours. Anemia Panel: No results for input(s): VITAMINB12, FOLATE, FERRITIN, TIBC, IRON, RETICCTPCT in the last 72 hours. Sepsis Labs: Recent Labs  Lab 01/01/20 0455 01/01/20 0709  LATICACIDVEN 1.0 0.7    Recent Results (from the past 240 hour(s))  Resp Panel by RT-PCR (Flu A&B, Covid) Nasopharyngeal Swab     Status: None   Collection Time: 12/31/19  9:25 PM   Specimen: Nasopharyngeal Swab; Nasopharyngeal(NP) swabs in vial transport medium  Result Value Ref Range Status   SARS Coronavirus 2 by RT PCR NEGATIVE NEGATIVE Final    Comment: (NOTE) SARS-CoV-2 target nucleic acids are NOT DETECTED.  The SARS-CoV-2 RNA is generally detectable in upper respiratory specimens during the acute phase of infection. The lowest concentration of SARS-CoV-2 viral copies this assay can detect is 138 copies/mL. A negative result does not preclude SARS-Cov-2 infection and should not be used as the sole basis for treatment or other patient management decisions. A negative result may occur with  improper specimen collection/handling, submission of specimen other than nasopharyngeal swab, presence of viral mutation(s) within the areas targeted by this assay, and inadequate number of viral copies(<138 copies/mL). A negative result must be combined with clinical observations, patient  history, and epidemiological information. The expected result is Negative.  Fact Sheet for Patients:  EntrepreneurPulse.com.au  Fact Sheet for Healthcare Providers:  IncredibleEmployment.be  This test is no t yet approved or cleared by the [place] FDA and  has been authorized for detection and/or diagnosis of SARS-CoV-2 by FDA under an Emergency Use Authorization (EUA). This EUA will remain  in effect (meaning this test can be used) for the duration of the COVID-19 declaration under Section 564(b)(1) of the Act, 21 U.S.C.section 360bbb-3(b)(1), unless the authorization is terminated  or revoked sooner.       Influenza A by PCR NEGATIVE NEGATIVE Final   Influenza B by PCR NEGATIVE NEGATIVE Final    Comment: (NOTE) The Xpert Xpress SARS-CoV-2/FLU/RSV plus assay is intended as an aid in the diagnosis of influenza from Nasopharyngeal swab specimens and should not be used as a sole basis for treatment. Nasal washings and aspirates are unacceptable for Xpert Xpress SARS-CoV-2/FLU/RSV testing.  Fact Sheet for Patients: EntrepreneurPulse.com.au  Fact Sheet for Healthcare Providers: IncredibleEmployment.be  This test is not yet approved or cleared by the [place] FDA and has been authorized for detection and/or diagnosis of SARS-CoV-2 by FDA under an Emergency Use Authorization (EUA). This EUA will remain in effect (meaning this test can be used) for the duration of the COVID-19 declaration under Section 564(b)(1) of the Act, 21 U.S.C. section 360bbb-3(b)(1), unless the authorization is terminated or revoked.  Performed at [facility], [place] [address], [place], [place] [address]   MRSA PCR Screening     Status: None   Collection Time: 01/01/20  4:09 AM   Specimen: Nasal Mucosa; Nasopharyngeal  Result Value Ref Range Status   MRSA by PCR NEGATIVE NEGATIVE Final    Comment:        The  GeneXpert MRSA Assay (FDA approved for NASAL specimens only), is one component of a comprehensive MRSA colonization surveillance program. It is not intended to diagnose MRSA infection nor to guide or monitor treatment for MRSA infections. Performed at [facility], [place] [address] [place], [place] [address]       Radiology Studies: DG Pelvis Comp Min 3V  Result Date: 01/02/2020 CLINICAL DATA:  Pelvic ring fracture, trauma. EXAM: JUDET PELVIS - 3+ VIEW COMPARISON:  CT 12/31/2019 FINDINGS: Previously seen pelvic fractures by CT difficult to visualize by plain film. The inferior pubic rami fractures are visualized. Other described fractures not visualized. Prior right hip replacement.  No hardware complicating feature. IMPRESSION: Previously seen pelvic fractures by CT difficult to visualize, better evaluated by CT. Electronically Signed   By: Rolm Baptise M.D.   On: 01/02/2020 10:30  Scheduled Meds: . amiodarone  200 mg Oral Daily  . apixaban  5 mg Oral BID  . ciprofloxacin  500 mg Oral BID  . escitalopram  10 mg Oral Daily  . fenofibrate  160 mg Oral Daily  . gabapentin  300 mg Oral BID  . insulin aspart  0-5 Units Subcutaneous QHS  . insulin aspart  0-9 Units Subcutaneous TID WC  . insulin glargine  12 Units Subcutaneous Daily  . ketoconazole   Topical BID  . melatonin  10 mg Oral QHS  . metoprolol tartrate  12.5 mg Oral BID  . montelukast  10 mg Oral Daily  . pantoprazole  40 mg Oral BID  . tamsulosin  0.4 mg Oral Daily  . traZODone  100 mg Oral QHS   Continuous Infusions:   LOS: 2 days   Time spent: 40 minutes   Thresa Dozier Loann Quill, MD [facility]  If 7PM-7AM, please contact night-coverage [email or web address] 01/03/2020, 12:43 PM

## 2020-01-03 NOTE — Evaluation (Signed)
Physical Therapy Evaluation Patient Details Name: Steve Anthony MRN: [id number] DOB: 1950/02/05 Today's Date: 01/03/2020   History of Present Illness  The pt is a 69 yo male presenting with multiple pelvic fx after being knocked down by his tractor. Non-op management planned at this point, pending pt mobility progression. Upon work-up pt also found to have UTI, AKI, but all imaging of head/neck were clear. PMH includes: DM II, SVT, a fib, CAD, COPD, DJD, dyslipidemia, HTN, and OSA but not compliant with CPAP. -  Clinical Impression  Pt in bed upon arrival of PT, agreeable to evaluation at this time. Prior to admission the pt was completely independent with mobility, living at home with his wife in a single story home with ramped entry. The pt now presents with limitations in functional mobility, strength, stability, and endurance due to above dx and resulting pain, and will continue to benefit from skilled PT to address these deficits. The pt was able to tolerate initial bed mobility and OOB transfers with min/modA of 2 for stability with use of RW, but was unable to progress to taking any lateral steps or clearing feet from the ground at this time. The pt will continue to benefit from skilled PT acutely to progress functional mobility, and is agreeable to continued rehab prior to d/c home.      Follow Up Recommendations SNF;Supervision/Assistance - 24 hour    Equipment Recommendations   (pt well equipped)    Recommendations for Other Services       Precautions / Restrictions Precautions Precautions: Fall Precaution Comments: multiple pelvic fx Restrictions Weight Bearing Restrictions: Yes RLE Weight Bearing: Partial weight bearing RLE Partial Weight Bearing Percentage or Pounds: 50% LLE Weight Bearing: Weight bearing as tolerated      Mobility  Bed Mobility Overal bed mobility: Needs Assistance Bed Mobility: Rolling;Sidelying to Sit;Sit to Sidelying Rolling: Min assist;+2 for  safety/equipment Sidelying to sit: Mod assist;+2 for physical assistance;+2 for safety/equipment     Sit to sidelying: Mod assist;+2 for safety/equipment;+2 for physical assistance;HOB elevated General bed mobility comments: assist for LEs and to lift and lower trunk    Transfers Overall transfer level: Needs assistance Equipment used: Rolling walker (2 wheeled) Transfers: Sit to/from Stand Sit to Stand: Mod assist;+2 physical assistance;+2 safety/equipment;From elevated surface         General transfer comment: verbal cues for hand placement and assist to boost into standing  Ambulation/Gait Ambulation/Gait assistance: Mod assist;+2 physical assistance   Assistive device: Rolling walker (2 wheeled)       General Gait Details: pt unable to take any small/lateral steps at this time. unable to lift RLE or LLE off ground      Balance Overall balance assessment: Needs assistance Sitting-balance support: Feet supported;Single extremity supported Sitting balance-Leahy Scale: Poor Sitting balance - Comments: requires UE support due to pain   Standing balance support: Single extremity supported Standing balance-Leahy Scale: Poor Standing balance comment: requires UE support                             Pertinent Vitals/Pain Pain Assessment: 0-10 Pain Score: 8  Pain Location: L hip, back, groin Pain Descriptors / Indicators: Dull Pain Intervention(s): Limited activity within patient's tolerance;Monitored during session;Premedicated before session;Repositioned    Home Living Family/patient expects to be discharged to:: Private residence Living Arrangements: Spouse/significant other;Other relatives Available Help at Discharge: Family;Available PRN/intermittently Type of Home: Mobile home Home Access: Ramped entrance  Home Layout: One level Home Equipment: Bedside commode;Walker - 2 wheels;Cane - single point;Walker - 4 wheels;Shower seat;Grab bars -  tub/shower;Grab bars - toilet;Hand held shower head;Wheelchair - Psychologist, educational      Prior Function Level of Independence: Independent         Comments: hx of back surgeries x3     Hand Dominance   Dominant Hand: Right    Extremity/Trunk Assessment   Upper Extremity Assessment Upper Extremity Assessment: Defer to OT evaluation    Lower Extremity Assessment Lower Extremity Assessment: LLE deficits/detail;RLE deficits/detail RLE Deficits / Details: limited by pain, needs assist for active hip movements, unable to hip flex against gravity RLE: Unable to fully assess due to pain LLE Deficits / Details: limited by pain, able to move in bed with minA, unable to complete SLR LLE: Unable to fully assess due to pain    Cervical / Trunk Assessment Cervical / Trunk Assessment: Normal  Communication   Communication: No difficulties  Cognition Arousal/Alertness: Awake/alert Behavior During Therapy: WFL for tasks assessed/performed Overall Cognitive Status: Within Functional Limits for tasks assessed                                        General Comments General comments (skin integrity, edema, etc.): VSS SpO2 93% on RA with mobility    Exercises     Assessment/Plan    PT Assessment Patient needs continued PT services  PT Problem List Decreased strength;Decreased activity tolerance;Decreased mobility;Decreased balance;Decreased coordination;Decreased knowledge of use of DME;Pain       PT Treatment Interventions DME instruction;Gait training;Stair training;Functional mobility training;Therapeutic activities;Therapeutic exercise;Balance training;Patient/family education    PT Goals (Current goals can be found in the Care Plan section)  Acute Rehab PT Goals Patient Stated Goal: to be able to take care of self PT Goal Formulation: With patient Time For Goal Achievement: 01/17/20 Potential to Achieve Goals: Good    Frequency Min 4X/week    Barriers to discharge Decreased caregiver support pt wife away during day    Co-evaluation PT/OT/SLP Co-Evaluation/Treatment: Yes Reason for Co-Treatment: For patient/therapist safety;To address functional/ADL transfers PT goals addressed during session: Mobility/safety with mobility;Balance;Proper use of DME;Strengthening/ROM OT goals addressed during session: Strengthening/ROM;ADL's and self-care       AM-PAC PT "6 Clicks" Mobility  Outcome Measure Help needed turning from your back to your side while in a flat bed without using bedrails?: A Little Help needed moving from lying on your back to sitting on the side of a flat bed without using bedrails?: A Lot Help needed moving to and from a bed to a chair (including a wheelchair)?: Total Help needed standing up from a chair using your arms (e.g., wheelchair or bedside chair)?: A Lot Help needed to walk in hospital room?: A Lot Help needed climbing 3-5 steps with a railing? : Total 6 Click Score: 11    End of Session Equipment Utilized During Treatment: Gait belt Activity Tolerance: Patient limited by fatigue;Patient limited by pain Patient left: in bed;with call bell/phone within reach;with bed alarm set Nurse Communication: Mobility status (pt left on RA) PT Visit Diagnosis: Other abnormalities of gait and mobility (R26.89);Pain Pain - Right/Left:  (bilateral) Pain - part of body: Hip    Time: 1725-1752 PT Time Calculation (min) (ACUTE ONLY): 27 min   Charges:   PT Evaluation $PT Eval Moderate Complexity: 1 Mod  Karma Ganja, PT, DPT   Acute Rehabilitation Department Pager #: [phone]  Otho Bellows 01/03/2020, 6:57 PM

## 2020-01-03 NOTE — Evaluation (Signed)
Occupational Therapy Evaluation Patient Details Name: Steve Anthony MRN: [id number] DOB: 12-16-1950 Today's Date: 01/03/2020    History of Present Illness The pt is a 69 yo male presenting with multiple pelvic fx after being knocked down by his tractor. Non-op management planned at this point, pending pt mobility progression. Upon work-up pt also found to have UTI, AKI, but all imaging of head/neck were clear. PMH includes: DM II, SVT, a fib, CAD, COPD, DJD, dyslipidemia, HTN, and OSA but not compliant with CPAP. -   Clinical Impression   Pt admitted with above. He demonstrates the below listed deficits and will benefit from continued OT to maximize safety and independence with BADLs.  Pt presents to OT with increased pain, decreased activity tolerance, generalized weakness, impaired balance.  He currently requires min - mod A for UB ADLs and max A - total A for LB ADLs.  He reports he lives with his wife, who works, and his 9+ y.o. mother. He reports he was fully independent PTA.  Recommend SNF level rehab.       Follow Up Recommendations  SNF    Equipment Recommendations  None recommended by OT    Recommendations for Other Services       Precautions / Restrictions Precautions Precautions: Fall Restrictions Weight Bearing Restrictions: Yes RLE Weight Bearing: Partial weight bearing RLE Partial Weight Bearing Percentage or Pounds: 50% LLE Weight Bearing: Weight bearing as tolerated      Mobility Bed Mobility Overal bed mobility: Needs Assistance Bed Mobility: Rolling;Sidelying to Sit;Sit to Sidelying Rolling: Min assist;+2 for safety/equipment Sidelying to sit: Mod assist;+2 for physical assistance;+2 for safety/equipment     Sit to sidelying: Mod assist;+2 for safety/equipment;+2 for physical assistance;HOB elevated General bed mobility comments: assist for LEs and to lift and lower trunk    Transfers Overall transfer level: Needs assistance Equipment used: Rolling  walker (2 wheeled) Transfers: Sit to/from Stand Sit to Stand: Mod assist;+2 physical assistance;+2 safety/equipment;From elevated surface         General transfer comment: verbal cues for hand placement and assist to boost into standing    Balance Overall balance assessment: Needs assistance Sitting-balance support: Feet supported;Single extremity supported Sitting balance-Leahy Scale: Poor Sitting balance - Comments: requires UE support due to pain   Standing balance support: Single extremity supported Standing balance-Leahy Scale: Poor Standing balance comment: requires UE support                           ADL either performed or assessed with clinical judgement   ADL Overall ADL's : Needs assistance/impaired Eating/Feeding: Independent   Grooming: Wash/dry hands;Wash/dry face;Oral care;Brushing hair;Sitting;Min guard   Upper Body Bathing: Moderate assistance;Sitting   Lower Body Bathing: Maximal assistance;Sit to/from stand   Upper Body Dressing : Moderate assistance;Sitting   Lower Body Dressing: Total assistance;Sit to/from stand   Toilet Transfer: Total assistance Toilet Transfer Details (indicate cue type and reason): unable Toileting- Clothing Manipulation and Hygiene: Total assistance;Sit to/from stand       Functional mobility during ADLs: Moderate assistance;+2 for physical assistance;+2 for safety/equipment;Rolling walker General ADL Comments: limited by pain     Vision         Perception     Praxis      Pertinent Vitals/Pain Pain Assessment: 0-10 Pain Score: 8  Pain Location: L hip, back, groin Pain Descriptors / Indicators: Dull Pain Intervention(s): Monitored during session;Limited activity within patient's tolerance;Repositioned;Premedicated before session  Hand Dominance Right   Extremity/Trunk Assessment Upper Extremity Assessment Upper Extremity Assessment: Overall WFL for tasks assessed   Lower Extremity  Assessment Lower Extremity Assessment: Defer to PT evaluation   Cervical / Trunk Assessment Cervical / Trunk Assessment: Normal   Communication Communication Communication: No difficulties   Cognition Arousal/Alertness: Awake/alert Behavior During Therapy: WFL for tasks assessed/performed Overall Cognitive Status: Within Functional Limits for tasks assessed                                     General Comments  VSS - 02 sats 93% on RA    Exercises     Shoulder Instructions      Home Living Family/patient expects to be discharged to:: Private residence Living Arrangements: Spouse/significant other;Other relatives Available Help at Discharge: Family;Available PRN/intermittently Type of Home: Mobile home Home Access: Ramped entrance     Home Layout: One level     Bathroom Shower/Tub: Occupational psychologist: Handicapped height Bathroom Accessibility: Yes   Home Equipment: Bedside commode;Walker - 2 wheels;Cane - single point;Walker - 4 wheels;Shower seat;Grab bars - tub/shower;Grab bars - toilet;Hand held shower head;Wheelchair - Scientist, physiological: Reacher;Long-handled Conservation officer, historic buildings        Prior Functioning/Environment Level of Independence: Independent        Comments: hx of back surgeries x3        OT Problem List: Decreased strength;Decreased activity tolerance;Impaired balance (sitting and/or standing);Decreased cognition;Decreased safety awareness;Decreased knowledge of use of DME or AE;Decreased knowledge of precautions;Pain      OT Treatment/Interventions: Self-care/ADL training;Therapeutic exercise;DME and/or AE instruction;Therapeutic activities;Patient/family education;Balance training    OT Goals(Current goals can be found in the care plan section) Acute Rehab OT Goals Patient Stated Goal: to be able to take care of self OT Goal Formulation: With patient Time For Goal Achievement: 01/17/20 Potential to  Achieve Goals: Good ADL Goals Pt Will Perform Grooming: with min guard assist;standing Pt Will Perform Upper Body Bathing: with set-up;sitting Pt Will Perform Lower Body Bathing: with min assist;with adaptive equipment;sit to/from stand Pt Will Perform Upper Body Dressing: with set-up;sitting Pt Will Perform Lower Body Dressing: with min assist;sit to/from stand;with adaptive equipment Pt Will Transfer to Toilet: with min assist;ambulating;regular height toilet;bedside commode;grab bars Pt Will Perform Toileting - Clothing Manipulation and hygiene: with min assist;sit to/from stand  OT Frequency: Min 2X/week   Barriers to D/C: Decreased caregiver support          Co-evaluation PT/OT/SLP Co-Evaluation/Treatment: Yes Reason for Co-Treatment: For patient/therapist safety;To address functional/ADL transfers   OT goals addressed during session: Strengthening/ROM;ADL's and self-care      AM-PAC OT "6 Clicks" Daily Activity     Outcome Measure Help from another person eating meals?: None Help from another person taking care of personal grooming?: A Little Help from another person toileting, which includes using toliet, bedpan, or urinal?: Total Help from another person bathing (including washing, rinsing, drying)?: A Lot Help from another person to put on and taking off regular upper body clothing?: A Lot Help from another person to put on and taking off regular lower body clothing?: Total 6 Click Score: 13   End of Session Equipment Utilized During Treatment: Gait belt;Rolling walker Nurse Communication: Mobility status  Activity Tolerance: Patient tolerated treatment well;Patient limited by pain Patient left: in bed;with call bell/phone within reach  OT Visit Diagnosis: Unsteadiness on feet (R26.81);Pain Pain - Right/Left: Right  Pain - part of body: Hip                Time: 1725-1752 OT Time Calculation (min): 27 min Charges:  OT General Charges $OT Visit: 1 Visit OT  Evaluation $OT Eval Moderate Complexity: 1 Mod  Nilsa Nutting., OTR/L Acute Rehabilitation Services Pager [phone] Office [phone]   Lucille Passy M 01/03/2020, 6:13 PM

## 2020-01-03 NOTE — Progress Notes (Signed)
Pt stated he has not use CPAP for over 20 yrs. Pt doesn't want to wear CPAP

## 2020-01-03 NOTE — Discharge Instructions (Signed)

## 2020-01-04 LAB — CBC
HCT: 33.1 % — ABNORMAL LOW (ref 39.0–52.0)
Hemoglobin: 10.5 g/dL — ABNORMAL LOW (ref 13.0–17.0)
MCH: 28.9 pg (ref 26.0–34.0)
MCHC: 31.7 g/dL (ref 30.0–36.0)
MCV: 91.2 fL (ref 80.0–100.0)
Platelets: 268 10*3/uL (ref 150–400)
RBC: 3.63 MIL/uL — ABNORMAL LOW (ref 4.22–5.81)
RDW: 14.5 % (ref 11.5–15.5)
WBC: 12.8 10*3/uL — ABNORMAL HIGH (ref 4.0–10.5)
nRBC: 0 % (ref 0.0–0.2)

## 2020-01-04 LAB — GLUCOSE, CAPILLARY
Glucose-Capillary: 161 mg/dL — ABNORMAL HIGH (ref 70–99)
Glucose-Capillary: 257 mg/dL — ABNORMAL HIGH (ref 70–99)
Glucose-Capillary: 225 mg/dL — ABNORMAL HIGH (ref 70–99)
Glucose-Capillary: 257 mg/dL — ABNORMAL HIGH (ref 70–99)

## 2020-01-04 LAB — MAGNESIUM: Magnesium: 1.6 mg/dL — ABNORMAL LOW (ref 1.7–2.4)

## 2020-01-04 MED ORDER — INSULIN GLARGINE 100 UNIT/ML ~~LOC~~ SOLN
24.0000 [IU] | Freq: Every day | SUBCUTANEOUS | Status: DC
  Administered 2020-01-05 – 2020-01-07 (×3): 24 [IU] via SUBCUTANEOUS
  Filled 2020-01-04 (×4): qty 0.24

## 2020-01-04 MED ORDER — MAGNESIUM SULFATE 2 GM/50ML IV SOLN
2.0000 g | Freq: Once | INTRAVENOUS | Status: AC
  Administered 2020-01-04: 2 g via INTRAVENOUS
  Filled 2020-01-04: qty 50

## 2020-01-04 MED ORDER — TRAMADOL HCL 50 MG PO TABS
50.0000 mg | ORAL_TABLET | Freq: Four times a day (QID) | ORAL | Status: DC
  Administered 2020-01-04 – 2020-01-07 (×14): 50 mg via ORAL
  Filled 2020-01-04 (×14): qty 1

## 2020-01-04 MED ORDER — POLYETHYLENE GLYCOL 3350 17 G PO PACK
17.0000 g | PACK | Freq: Every day | ORAL | Status: DC
  Administered 2020-01-04 – 2020-01-07 (×3): 17 g via ORAL
  Filled 2020-01-04 (×4): qty 1

## 2020-01-04 MED ORDER — DOCUSATE SODIUM 100 MG PO CAPS: 100.0000 mg | ORAL_CAPSULE | Freq: Two times a day (BID) | ORAL | Status: DC | PRN

## 2020-01-04 NOTE — TOC Initial Note (Signed)
Transition of Care [facility]) - Initial/Assessment Note    Patient Details  Name: Steve Anthony MRN: [id number] Date of Birth: 07/05/1950  Transition of Care [facility]) CM/SW Contact:    Vinie Sill, [place] Phone Number: 01/04/2020, 2:41 PM  Clinical Narrative:                  CSW met with patient at bedside, along with his wife,Linda. CSW introduced self and explained role. CSW discussed with patient , PT recommendation of short term rehab at [facility]. Patient recognizes need for rehab before returning home and is agreeable to a SNF placement. Patient reported preference for [facility].   [facility] has confirmed bed offer. CSW has started Lexicographer # [id number].   CSW will continue to follow and assist with discharge planning.   Thurmond Butts, MSW, [facility] Clinical Social Worker   Expected Discharge Plan: Skilled Nursing Facility Barriers to Discharge: Insurance Authorization,SNF Pending bed offer   Patient Goals and CMS Choice        Expected Discharge Plan and Services Expected Discharge Plan: [place] In-house Referral: Clinical Social Work                                            Prior Living Arrangements/Services   Lives with:: Self,Spouse (mother) Patient language and need for interpreter reviewed:: No        Need for Family Participation in Patient Care: Yes (Comment) Care giver support system in place?: Yes (comment)   Criminal Activity/Legal Involvement Pertinent to Current Situation/Hospitalization: No - Comment as needed  Activities of Daily Living Home Assistive Devices/Equipment: None ADL Screening (condition at time of admission) Patient's cognitive ability adequate to safely complete daily activities?: Yes Is the patient deaf or have difficulty hearing?: No Does the patient have difficulty seeing, even when wearing glasses/contacts?: No Does the patient have difficulty concentrating,  remembering, or making decisions?: No Patient able to express need for assistance with ADLs?: Yes Does the patient have difficulty dressing or bathing?: No Independently performs ADLs?: Yes (appropriate for developmental age) Does the patient have difficulty walking or climbing stairs?: No Weakness of Legs: Both Weakness of Arms/Hands: None  Permission Sought/Granted Permission sought to share information with : Family Supports Permission granted to share information with : Yes, Verbal Permission Granted  Share Information with NAME: Elmond Poehlman  Permission granted to share info w AGENCY: SNFs  Permission granted to share info w Relationship: spouse  Permission granted to share info w Contact Information: [phone]  Emotional Assessment Appearance:: Appears stated age Attitude/Demeanor/Rapport: Self-Confident,Engaged Affect (typically observed): Accepting,Appropriate Orientation: : Oriented to Self,Oriented to Place,Oriented to  Time,Oriented to Situation Alcohol / Substance Use: Not Applicable Psych Involvement: No (comment)  Admission diagnosis:  Pelvic fracture ([place]) [S32.9XXA] History of pelvic fracture [Z87.81] Accidental fall, initial encounter [W19.XXXA] Fracture of multiple pubic rami, left, closed, initial encounter [facility]) [S32.592A] Patient Active Problem List   Diagnosis Date Noted  . History of pelvic fracture 01/01/2020  . Pelvic fracture ([place]) 12/31/2019  . Hypotension 06/13/2019  . Protein-calorie malnutrition, severe 06/12/2019  . Pancreatic pseudocyst 06/07/2019  . Infected pancreatic pseudocyst 06/07/2019  . Pseudocyst of pancreas 03/06/2019  . Acute pancreatitis 02/20/2019  . CKD (chronic kidney disease), stage III ([place]) 02/20/2019  . History of COVID-19 01/24/19 02/20/2019  . Abnormal CT of the abdomen 05/27/2018  . Recurrent pancreatitis 05/27/2018  .  Idiopathic acute pancreatitis 04/14/2018  . Abnormal MRI of abdomen 04/14/2018  . Pancreatic cyst  04/14/2018  . Cirrhosis of liver without ascites ([place]) 04/14/2018  . Chest pain 12/16/2016  . Pure hypercholesterolemia   . S/P coronary artery stent placement   . Long term (current) use of anticoagulants [Z79.01] 06/10/2015  . OA (osteoarthritis) of hip 12/18/2014  . Coronary atherosclerosis 12/31/2009  . Sleep apnea 05/17/2009  . COPD without exacerbation ([place]) 05/15/2009  . SHORTNESS OF BREATH 03/19/2009  . Atrial fibrillation ([place]) 11/21/2008  . CHEST PAIN UNSPECIFIED 11/21/2008  . HYPERTRIGLYCERIDEMIA 10/15/2008  . DYSLIPIDEMIA 10/15/2008  . Essential hypertension 10/15/2008  . Unstable angina ([place]) 10/15/2008  . GERD 10/15/2008  . NEPHROLITHIASIS 10/15/2008  . PALPITATIONS 10/15/2008   PCP:  [name], MD Pharmacy:   [place], [place] - [address]. [place] [address] Phone: [phone] Fax: [phone]     Social Determinants of Health (SDOH) Interventions    Readmission Risk Interventions Readmission Risk Prevention Plan 01/04/2020 06/18/2019 06/12/2019  Transportation Screening Complete - Complete  PCP or Specialist Appt within 5-7 Days - - Complete  PCP or Specialist Appt within 3-5 Days Complete Complete -  Home Care Screening - - Complete  Medication Review (RN CM) - - Referral to Pharmacy  [facility] or [place] - Complete -  Social Work Consult for [place] Planning/Counseling - Complete -  Palliative Care Screening Not Applicable Not Applicable -  Medication Review (RN Care Manager) Complete Complete -  Some recent data might be hidden

## 2020-01-04 NOTE — Progress Notes (Signed)
Physical Therapy Treatment Patient Details Name: Steve Anthony MRN: [id number] DOB: [birth year] Today's Date: 01/04/2020    History of Present Illness The pt is a 69 yo male presenting with multiple pelvic fx after being knocked down by his tractor. Non-op management planned at this point, pending pt mobility progression. Upon work-up pt also found to have UTI, AKI, but all imaging of head/neck were clear. PMH includes: DM II, SVT, a fib, CAD, COPD, DJD, dyslipidemia, HTN, and OSA but not compliant with CPAP. -    PT Comments    Pt progressing slowly toward goals due to pain.  Emphasis on transitions, scooting, sitting balance, sit to stand and attempting to unwieght L LE and pivot in the RW, which pt found difficult before getting nauseous and having to discontinue standing and further therapy.    Follow Up Recommendations  SNF;Supervision/Assistance - 24 hour     Equipment Recommendations   (pt well equipped)    Recommendations for Other Services       Precautions / Restrictions Precautions Precautions: Fall Precaution Comments: multiple pelvic fx Restrictions RLE Weight Bearing: Partial weight bearing LLE Weight Bearing: Weight bearing as tolerated    Mobility  Bed Mobility Overal bed mobility: Needs Assistance Bed Mobility: Rolling;Sidelying to Sit;Sit to Sidelying Rolling: Min assist Sidelying to sit: Mod assist     Sit to sidelying: Mod assist;HOB elevated General bed mobility comments: assist for LEs and to lift and lower trunk.  pt is reluctant to let therapist assist, but needs it and makes it hard on himself.  Transfers Overall transfer level: Needs assistance Equipment used: Rolling walker (2 wheeled) Transfers: Sit to/from Stand Sit to Stand: From elevated surface;Mod assist         General transfer comment: verbal cues for hand placement and assist to boost into standing  Ambulation/Gait             General Gait Details: pt unable to take any  small/lateral steps at this time. unable to lift RLE or LLE off ground   Stairs             Wheelchair Mobility    Modified Rankin (Stroke Patients Only)       Balance Overall balance assessment: Needs assistance Sitting-balance support: Feet supported;Single extremity supported Sitting balance-Leahy Scale: Poor Sitting balance - Comments: requires UE support due to pain   Standing balance support: Single extremity supported Standing balance-Leahy Scale: Poor Standing balance comment: requires UE support                            Cognition Arousal/Alertness: Awake/alert Behavior During Therapy: WFL for tasks assessed/performed Overall Cognitive Status: Within Functional Limits for tasks assessed                                        Exercises      General Comments General comments (skin integrity, edema, etc.): SpO288-94% on RA,  pt declining use while moving.      Pertinent Vitals/Pain Pain Location: L hip, back, groin Pain Descriptors / Indicators: Dull    Home Living Family/patient expects to be discharged to:: Private residence Living Arrangements: Spouse/significant other;Other relatives Available Help at Discharge: Family;Available PRN/intermittently Type of Home: Mobile home Home Access: Ramped entrance   Home Layout: One level Home Equipment: Bedside commode;Walker - 2 wheels;Cane - single point;Walker -  4 wheels;Shower seat;Grab bars - tub/shower;Grab bars - toilet;Hand held shower head;Wheelchair - Psychologist, educational      Prior Function Level of Independence: Independent      Comments: hx of back surgeries x3   PT Goals (current goals can now be found in the care plan section) Acute Rehab PT Goals Patient Stated Goal: to be able to take care of self PT Goal Formulation: With patient Time For Goal Achievement: 01/17/20 Potential to Achieve Goals: Good Progress towards PT goals: Progressing toward  goals    Frequency    Min 4X/week      PT Plan      Co-evaluation              AM-PAC PT "6 Clicks" Mobility   Outcome Measure  Help needed turning from your back to your side while in a flat bed without using bedrails?: A Little Help needed moving from lying on your back to sitting on the side of a flat bed without using bedrails?: A Lot Help needed moving to and from a bed to a chair (including a wheelchair)?: Total Help needed standing up from a chair using your arms (e.g., wheelchair or bedside chair)?: A Lot Help needed to walk in hospital room?: A Lot Help needed climbing 3-5 steps with a railing? : Total 6 Click Score: 11    End of Session Equipment Utilized During Treatment: Gait belt Activity Tolerance: Patient limited by pain Patient left: in bed;with call bell/phone within reach;with bed alarm set Nurse Communication: Mobility status (pt left on RA) PT Visit Diagnosis: Other abnormalities of gait and mobility (R26.89);Pain Pain - Right/Left: Left (bilateral  L worse than R LE) Pain - part of body: Hip     Time: 0947-0962 PT Time Calculation (min) (ACUTE ONLY): 30 min  Charges:  $Therapeutic Activity: 23-37 mins                     01/04/2020  [name], PT Acute Rehabilitation Services [phone]  (pager) [phone]  (office)   Tessie Fass [place]on [place] 01/04/2020, 5:38 PM

## 2020-01-04 NOTE — Care Management Important Message (Signed)
Important Message  Patient Details  Name: Steve Anthony MRN: [id number] Date of Birth: [birth year]   Medicare Important Message Given:  Yes  Spoke with Patient wife in room  to remind of the IM she was aware and advised that no addl. Copy needed   [name] 01/04/2020, 2:56 PM

## 2020-01-04 NOTE — [place] FL2 (Signed)
[place] MEDICAID FL2 LEVEL OF CARE SCREENING TOOL     IDENTIFICATION  Patient Name: Steve Anthony Birthdate: 01-11-1951 Sex: male Admission Date (Current Location): 12/31/2019  [facility] and [place] Number:  [employer] and Address:  [facility] [place], [place] [address] [place], [place] [address]      Provider Number: [id number]  Attending Physician Name and Address:  Mckinley Jewel, MD  Relative Name and Phone Number:       Current Level of Care: Hospital Recommended Level of Care: [place] Prior Approval Number:    Date Approved/Denied:   PASRR Number:    Discharge Plan: SNF    Current Diagnoses: Patient Active Problem List   Diagnosis Date Noted  . History of pelvic fracture 01/01/2020  . Pelvic fracture ([place]) 12/31/2019  . Hypotension 06/13/2019  . Protein-calorie malnutrition, severe 06/12/2019  . Pancreatic pseudocyst 06/07/2019  . Infected pancreatic pseudocyst 06/07/2019  . Pseudocyst of pancreas 03/06/2019  . Acute pancreatitis 02/20/2019  . CKD (chronic kidney disease), stage III ([place]) 02/20/2019  . History of COVID-19 01/24/19 02/20/2019  . Abnormal CT of the abdomen 05/27/2018  . Recurrent pancreatitis 05/27/2018  . Idiopathic acute pancreatitis 04/14/2018  . Abnormal MRI of abdomen 04/14/2018  . Pancreatic cyst 04/14/2018  . Cirrhosis of liver without ascites ([place]) 04/14/2018  . Chest pain 12/16/2016  . Pure hypercholesterolemia   . S/P coronary artery stent placement   . Long term (current) use of anticoagulants [Z79.01] 06/10/2015  . OA (osteoarthritis) of hip 12/18/2014  . Coronary atherosclerosis 12/31/2009  . Sleep apnea 05/17/2009  . COPD without exacerbation ([place]) 05/15/2009  . SHORTNESS OF BREATH 03/19/2009  . Atrial fibrillation ([place]) 11/21/2008  . CHEST PAIN UNSPECIFIED 11/21/2008  . HYPERTRIGLYCERIDEMIA 10/15/2008  . DYSLIPIDEMIA 10/15/2008  . Essential hypertension 10/15/2008  .  Unstable angina ([place]) 10/15/2008  . GERD 10/15/2008  . NEPHROLITHIASIS 10/15/2008  . PALPITATIONS 10/15/2008    Orientation RESPIRATION BLADDER Height & Weight     Self,Time,Situation,Place  O2 External catheter,Incontinent Weight: 228 lb (103.4 kg) Height:  6' (182.9 cm)  BEHAVIORAL SYMPTOMS/MOOD NEUROLOGICAL BOWEL NUTRITION STATUS      Continent Diet (please see discharge summary)  AMBULATORY STATUS COMMUNICATION OF NEEDS Skin     Verbally  (Eccymosis-left hip)                       Personal Care Assistance Level of Assistance  Bathing,Dressing,Feeding Bathing Assistance: Limited assistance Feeding assistance: Independent Dressing Assistance: Limited assistance     Functional Limitations Info  Sight,Hearing,Speech Sight Info: Adequate Hearing Info: Adequate Speech Info: Adequate    SPECIAL CARE FACTORS FREQUENCY  PT (By licensed PT),OT (By licensed OT)     PT Frequency: 5x per week OT Frequency: 5x per week            Contractures Contractures Info: Not present    Additional Factors Info  Code Status,Allergies,Insulin Sliding Scale Code Status Info: FULL Allergies Info: Penicillins   Insulin Sliding Scale Info: see discharge summary       Current Medications (01/04/2020):  This is the current hospital active medication list Current Facility-Administered Medications  Medication Dose Route Frequency Provider Last Rate Last Admin  . acetaminophen (TYLENOL) tablet 650 mg  650 mg Oral Q6H PRN Rise Patience, MD   650 mg at 01/04/20 8280   Or  . acetaminophen (TYLENOL) suppository 650 mg  650 mg Rectal Q6H PRN Rise Patience,  MD      . albuterol (VENTOLIN HFA) 108 (90 Base) MCG/ACT inhaler 2 puff  2 puff Inhalation Q4H PRN Pahwani, Rinka R, MD      . amiodarone (PACERONE) tablet 200 mg  200 mg Oral Daily Rise Patience, MD   200 mg at 01/04/20 0909  . apixaban (ELIQUIS) tablet 5 mg  5 mg Oral BID Pahwani, Rinka R, MD   5 mg at 01/04/20  0911  . ciprofloxacin (CIPRO) tablet 500 mg  500 mg Oral BID Pahwani, Rinka R, MD   500 mg at 01/04/20 0909  . docusate sodium (COLACE) capsule 100 mg  100 mg Oral BID PRN Pahwani, Rinka R, MD      . escitalopram (LEXAPRO) tablet 10 mg  10 mg Oral Daily Barb Merino, MD   10 mg at 01/04/20 0909  . fenofibrate tablet 160 mg  160 mg Oral Daily Barb Merino, MD   160 mg at 01/04/20 0909  . gabapentin (NEURONTIN) capsule 300 mg  300 mg Oral BID Rise Patience, MD   300 mg at 01/04/20 0908  . insulin aspart (novoLOG) injection 0-5 Units  0-5 Units Subcutaneous QHS Lang Snow, FNP   3 Units at 01/03/20 2134  . insulin aspart (novoLOG) injection 0-9 Units  0-9 Units Subcutaneous TID WC Pahwani, Rinka R, MD   5 Units at 01/04/20 1204  . [START ON 01/05/2020] insulin glargine (LANTUS) injection 24 Units  24 Units Subcutaneous Daily Pahwani, Rinka R, MD      . ipratropium-albuterol (DUONEB) 0.5-2.5 (3) MG/3ML nebulizer solution 3 mL  3 mL Nebulization Q2H PRN Rise Patience, MD      . ketoconazole (NIZORAL) 2 % cream   Topical BID Pahwani, Rinka R, MD   Given at 01/04/20 0915  . labetalol (NORMODYNE) injection 10 mg  10 mg Intravenous Q2H PRN Rise Patience, MD      . melatonin tablet 10 mg  10 mg Oral QHS Rise Patience, MD   10 mg at 01/03/20 2121  . metoprolol tartrate (LOPRESSOR) tablet 12.5 mg  12.5 mg Oral BID Barb Merino, MD   12.5 mg at 01/04/20 0909  . montelukast (SINGULAIR) tablet 10 mg  10 mg Oral Daily Barb Merino, MD   10 mg at 01/04/20 0909  . morphine 4 MG/ML injection 4 mg  4 mg Intravenous Q3H PRN Barb Merino, MD   4 mg at 01/03/20 0539  . oxyCODONE (Oxy IR/ROXICODONE) immediate release tablet 10 mg  10 mg Oral Q4H PRN Pahwani, Rinka R, MD   10 mg at 01/04/20 1336  . pantoprazole (PROTONIX) EC tablet 40 mg  40 mg Oral BID Barb Merino, MD   40 mg at 01/04/20 0909  . polyethylene glycol (MIRALAX / GLYCOLAX) packet 17 g  17 g Oral Daily Pahwani,  Rinka R, MD   17 g at 01/04/20 0931  . prochlorperazine (COMPAZINE) injection 10 mg  10 mg Intravenous Q6H PRN Lang Snow, FNP      . tamsulosin [facility]) capsule 0.4 mg  0.4 mg Oral Daily Barb Merino, MD   0.4 mg at 01/04/20 0909  . traMADol (ULTRAM) tablet 50 mg  50 mg Oral Q6H Pahwani, Rinka R, MD   50 mg at 01/04/20 1336  . traZODone (DESYREL) tablet 100 mg  100 mg Oral QHS Barb Merino, MD   100 mg at 01/03/20 2122     Discharge Medications: Please see discharge summary for a list of discharge medications.  Relevant Imaging Results:  Relevant Lab Results:   Additional Information SSN [id number] - has received covid vaccines & booster shot  Vinie Sill, [place]

## 2020-01-04 NOTE — TOC CAGE-AID Note (Signed)
Transition of Care [facility]) - CAGE-AID Screening   Patient Details  Name: Steve Anthony MRN: [id number] Date of Birth: 1950-10-13  Transition of Care [facility]) CM/SW Contact:    Vinie Sill, [place] Phone Number: 01/04/2020, 2:37 PM   Clinical Narrative:  Cage-Aid screening completed. Patient declined need for SA resources.  Thurmond Butts, MSW, LCSWA Clinical Social Worker   CAGE-AID Screening:    Have You Ever Felt You Ought to Cut Down on Your Drinking or Drug Use?: No Have People Annoyed You By [employer] Your Drinking Or Drug Use?: No Have You Felt Bad Or Guilty About Your Drinking Or Drug Use?: No Have You Ever Had a Drink or Used Drugs First Thing In The Morning to Steady Your Nerves or to Get Rid of a Hangover?: No CAGE-AID Score: 0  Substance Abuse Education Offered: Yes

## 2020-01-04 NOTE — Progress Notes (Signed)
PROGRESS NOTE    Steve Anthony  FUX:[id number] DOB: [birth year] DOA: 12/31/2019 PCP: [name], MD   Brief Narrative:  Patient is 69 year old male with past medical history of A. fib-on Eliquis, coronary artery disease status post stent placement, history of idiopathic pancreatitis status post distal pancreatectomy and splenectomy, chronic anemia, COPD, prediabetes, chronic pain syndrome-on chronic opioids presents to emergency department with trauma from home.  He was working on a tractor and the tractor rolled on him and knocked him off.  He was found with multiple pelvic bone fractures and hematoma.  ED course: In the ER x-ray revealed pelvic fracture this was further confirmed with CT pelvis which show 7.6 cm hematoma overlying the left hip.  There was also a small amount of pelvic hematoma.  Patient's labs are significant for acute renal failure and hemoglobin was found 12.5, WBC of 15.7.  CT head was negative for acute findings.  Orthopedic surgery consulted.  Patient admitted for further evaluation and management for pelvic fracture and hematoma.  Assessment & Plan:   Multiple comminuted nondisplaced pelvic fracture: -Orthopedic surgeon consulted recommended medical management-WBAT LLE and 50% WB RLE and follow-up with Dr. Ulanda Edison outpatient. -Continue pain control as needed.  Added tramadol every 6 hours scheduled. -Continue to monitor H&H and vitals closely. -Consult PT/OT recommended SNF  Pelvic hematoma: -Reviewed CT pelvis. -Resumed Eliquis on 12/9 as per Ortho recommendations.  H&H remained stable.  UTI:   UA positive for leukocyte, WBC, bacteria.  Culture shows Klebsiella pneumoniae, susceptibilities pending -Patient is allergic to penicillin.  Discussed with pharmacy-based on previous culture result start patient on Cipro 500 twice daily for 7 days.  No prolonged QT noted on recent EKG.  Paroxysmal A. fib: Rate controlled.   -Continue metoprolol, amiodarone, Eliquis.   Monitor heart rate closely on telemetry.  Coronary artery disease status post stenting: Patient denies ACS symptoms. -Continue fenofibrate, metoprolol  Chronic pain syndrome-on chronic opioids outpatient: Continue oxycodone 10 mg every 4 hours as needed.  Discontinue Zanaflex due to interaction with ciprofloxacin.  Added tramadol scheduled for pain control.  COPD: On 2 L via nasal cannula PRN.  Continue home inhalers.  No wheezing noted on exam.  Hypertension: Stable.  Continue metoprolol.  History of idiopathic pancreatitis: Status post distal pancreatectomy and splenectomy.  Depression: Continue Lexapro, trazodone  Hyperlipidemia: Continue fenofibrate  GERD: Continue PPI  BPH: Continue Flomax  Hypomagnesemia: Replenished.  Repeat magnesium level tomorrow AM.  Intertrigo: Antifungal cream twice daily  DVT prophylaxis: SCD Code Status: Full code Family Communication: Patient's wife present at bedside.  Plan of care discussed with patient in length and he verbalized understanding and agreed with it. Disposition Plan: SNF   Consultants:  Orthopedic surgery Procedures:   CT pelvis  Antimicrobials:   None  Status is: Inpatient  Dispo: The patient is from: Home              Anticipated d/c is to: SNF              Anticipated d/c date is: In 1 to 2 days              Patient currently is not medically stable to d/c.    Subjective: Patient seen and examined.  Continues to have pain in his pelvis especially with the movements.  He denies chest pain, shortness of breath, leg swelling, orthopnea, PND, palpitation, fever or chills or nausea or vomiting.  Objective: Vitals:   01/03/20 2318 01/04/20 0313 01/04/20 0254  01/04/20 1149  BP: 114/66 101/68    Pulse: 73 68    Resp: 19 20    Temp:  98.8 F (37.1 C) 98.7 F (37.1 C) 98.7 F (37.1 C)  TempSrc:  Oral Oral Oral  SpO2: 95% 93%    Weight:      Height:        Intake/Output Summary (Last 24 hours) at 01/04/2020  1318 Last data filed at 01/04/2020 0900 Gross per 24 hour  Intake 720 ml  Output 1350 ml  Net -630 ml   Filed Weights   12/31/19 1855  Weight: 103.4 kg    Examination: General exam: Appears calm and comfortable mild early, on room air, communicating well Respiratory system: Clear to auscultation. Respiratory effort normal. Cardiovascular system: S1 & S2 heard, RRR. No JVD, murmurs, rubs, gallops or clicks. No pedal edema. Gastrointestinal system: Abdomen is nondistended, soft and nontender. No organomegaly or masses felt. Normal bowel sounds heard. Central nervous system: Alert and oriented. No focal neurological deficits. Extremities: Symmetric 5 x 5 power. Skin: Large bruise noted on left upper thigh and buttock Psychiatry: Judgement and insight appear normal. Mood & affect appropriate.   Data Reviewed: I have personally reviewed following labs and imaging studies  CBC: Recent Labs  Lab 01/01/20 0455 01/01/20 0709 01/02/20 0420 01/03/20 0122 01/04/20 0144  WBC 11.7* 12.7* 11.5* 13.7* 12.8*  NEUTROABS  --   --  7.8*  --   --   HGB 11.7* 12.2* 11.7* 10.5* 10.5*  HCT 36.6* 38.1* 35.8* 31.9* 33.1*  MCV 90.1 90.9 92.5 91.7 91.2  PLT 285 304 256 218 629   Basic Metabolic Panel: Recent Labs  Lab 12/31/19 2125 01/01/20 0455 01/02/20 0420 01/03/20 0122 01/04/20 0144  NA 135 135 136 135  --   K 4.5 4.0 4.2 4.4  --   CL 101 103 103 103  --   CO2 24 23 26 23   --   GLUCOSE 164* 118* 153* 216*  --   BUN 12 12 10 10   --   CREATININE 1.44* 1.32* 1.21 1.24  --   CALCIUM 8.5* 8.0* 8.3* 8.3*  --   MG  --   --  1.3* 1.4* 1.6*   GFR: Estimated Creatinine Clearance: 70.9 mL/min (by C-G formula based on SCr of 1.24 mg/dL). Liver Function Tests: Recent Labs  Lab 01/01/20 0455  AST 20  ALT 14  ALKPHOS 28*  BILITOT 0.8  PROT 4.9*  ALBUMIN 2.8*   No results for input(s): LIPASE, AMYLASE in the last 168 hours. No results for input(s): AMMONIA in the last 168  hours. Coagulation Profile: No results for input(s): INR, PROTIME in the last 168 hours. Cardiac Enzymes: No results for input(s): CKTOTAL, CKMB, CKMBINDEX, TROPONINI in the last 168 hours. BNP (last 3 results) No results for input(s): PROBNP in the last 8760 hours. HbA1C: No results for input(s): HGBA1C in the last 72 hours. CBG: Recent Labs  Lab 01/03/20 1644 01/03/20 2125 01/03/20 2321 01/04/20 0747 01/04/20 1148  GLUCAP 213* 252* 218* 161* 257*   Lipid Profile: No results for input(s): CHOL, HDL, LDLCALC, TRIG, CHOLHDL, LDLDIRECT in the last 72 hours. Thyroid Function Tests: No results for input(s): TSH, T4TOTAL, FREET4, T3FREE, THYROIDAB in the last 72 hours. Anemia Panel: No results for input(s): VITAMINB12, FOLATE, FERRITIN, TIBC, IRON, RETICCTPCT in the last 72 hours. Sepsis Labs: Recent Labs  Lab 01/01/20 0455 01/01/20 0709  LATICACIDVEN 1.0 0.7    Recent Results (from the past 240  hour(s))  Resp Panel by RT-PCR (Flu A&B, Covid) Nasopharyngeal Swab     Status: None   Collection Time: 12/31/19  9:25 PM   Specimen: Nasopharyngeal Swab; Nasopharyngeal(NP) swabs in vial transport medium  Result Value Ref Range Status   SARS Coronavirus 2 by RT PCR NEGATIVE NEGATIVE Final    Comment: (NOTE) SARS-CoV-2 target nucleic acids are NOT DETECTED.  The SARS-CoV-2 RNA is generally detectable in upper respiratory specimens during the acute phase of infection. The lowest concentration of SARS-CoV-2 viral copies this assay can detect is 138 copies/mL. A negative result does not preclude SARS-Cov-2 infection and should not be used as the sole basis for treatment or other patient management decisions. A negative result may occur with  improper specimen collection/handling, submission of specimen other than nasopharyngeal swab, presence of viral mutation(s) within the areas targeted by this assay, and inadequate number of viral copies(<138 copies/mL). A negative result must be  combined with clinical observations, patient history, and epidemiological information. The expected result is Negative.  Fact Sheet for Patients:  EntrepreneurPulse.com.au  Fact Sheet for Healthcare Providers:  IncredibleEmployment.be  This test is no t yet approved or cleared by the [place] FDA and  has been authorized for detection and/or diagnosis of SARS-CoV-2 by FDA under an Emergency Use Authorization (EUA). This EUA will remain  in effect (meaning this test can be used) for the duration of the COVID-19 declaration under Section 564(b)(1) of the Act, 21 U.S.C.section 360bbb-3(b)(1), unless the authorization is terminated  or revoked sooner.       Influenza A by PCR NEGATIVE NEGATIVE Final   Influenza B by PCR NEGATIVE NEGATIVE Final    Comment: (NOTE) The Xpert Xpress SARS-CoV-2/FLU/RSV plus assay is intended as an aid in the diagnosis of influenza from Nasopharyngeal swab specimens and should not be used as a sole basis for treatment. Nasal washings and aspirates are unacceptable for Xpert Xpress SARS-CoV-2/FLU/RSV testing.  Fact Sheet for Patients: EntrepreneurPulse.com.au  Fact Sheet for Healthcare Providers: IncredibleEmployment.be  This test is not yet approved or cleared by the [place] FDA and has been authorized for detection and/or diagnosis of SARS-CoV-2 by FDA under an Emergency Use Authorization (EUA). This EUA will remain in effect (meaning this test can be used) for the duration of the COVID-19 declaration under Section 564(b)(1) of the Act, 21 U.S.C. section 360bbb-3(b)(1), unless the authorization is terminated or revoked.  Performed at [facility], [place] [address] [place], [place] [address]   MRSA PCR Screening     Status: None   Collection Time: 01/01/20  4:09 AM   Specimen: Nasal Mucosa; Nasopharyngeal  Result Value Ref Range Status   MRSA by PCR NEGATIVE  NEGATIVE Final    Comment:        The GeneXpert MRSA Assay (FDA approved for NASAL specimens only), is one component of a comprehensive MRSA colonization surveillance program. It is not intended to diagnose MRSA infection nor to guide or monitor treatment for MRSA infections. Performed at [facility], [place] [address] [place], [place] [address]   Culture, Urine     Status: Abnormal (Preliminary result)   Collection Time: 01/03/20 12:52 PM   Specimen: Urine, Random  Result Value Ref Range Status   Specimen Description URINE, RANDOM  Final   Special Requests NONE  Final   Culture (A)  Final    >=100,000 COLONIES/mL KLEBSIELLA PNEUMONIAE SUSCEPTIBILITIES TO FOLLOW Performed at [facility], [place] [address], [place], [place]  [address]    Report Status PENDING  Incomplete      Radiology Studies: No results found.  Scheduled Meds: . amiodarone  200 mg Oral Daily  . apixaban  5 mg Oral BID  . ciprofloxacin  500 mg Oral BID  . escitalopram  10 mg Oral Daily  . fenofibrate  160 mg Oral Daily  . gabapentin  300 mg Oral BID  . insulin aspart  0-5 Units Subcutaneous QHS  . insulin aspart  0-9 Units Subcutaneous TID WC  . insulin glargine  12 Units Subcutaneous Daily  . ketoconazole   Topical BID  . melatonin  10 mg Oral QHS  . metoprolol tartrate  12.5 mg Oral BID  . montelukast  10 mg Oral Daily  . pantoprazole  40 mg Oral BID  . polyethylene glycol  17 g Oral Daily  . tamsulosin  0.4 mg Oral Daily  . traMADol  50 mg Oral Q6H  . traZODone  100 mg Oral QHS   Continuous Infusions:   LOS: 3 days   Time spent: 40 minutes   Devan Danzer Loann Quill, MD [facility]  If 7PM-7AM, please contact night-coverage [email or web address] 01/04/2020, 1:18 PM

## 2020-01-05 ENCOUNTER — Inpatient Hospital Stay (HOSPITAL_COMMUNITY): Payer: Medicare PPO

## 2020-01-05 LAB — BASIC METABOLIC PANEL
Anion gap: 7 (ref 5–15)
BUN: 11 mg/dL (ref 8–23)
CO2: 29 mmol/L (ref 22–32)
Calcium: 8.5 mg/dL — ABNORMAL LOW (ref 8.9–10.3)
Chloride: 97 mmol/L — ABNORMAL LOW (ref 98–111)
Creatinine, Ser: 1.29 mg/dL — ABNORMAL HIGH (ref 0.61–1.24)
GFR, Estimated: >60 mL/min (ref >60)
Glucose, Bld: 267 mg/dL — ABNORMAL HIGH (ref 70–99)
Potassium: 4.4 mmol/L (ref 3.5–5.1)
Sodium: 133 mmol/L — ABNORMAL LOW (ref 135–145)

## 2020-01-05 LAB — CBC
HCT: 33.1 % — ABNORMAL LOW (ref 39.0–52.0)
Hemoglobin: 10.5 g/dL — ABNORMAL LOW (ref 13.0–17.0)
MCH: 28.9 pg (ref 26.0–34.0)
MCHC: 31.7 g/dL (ref 30.0–36.0)
MCV: 91.2 fL (ref 80.0–100.0)
Platelets: 319 10*3/uL (ref 150–400)
RBC: 3.63 MIL/uL — ABNORMAL LOW (ref 4.22–5.81)
RDW: 14.4 % (ref 11.5–15.5)
WBC: 11.2 10*3/uL — ABNORMAL HIGH (ref 4.0–10.5)
nRBC: 0 % (ref 0.0–0.2)

## 2020-01-05 LAB — URINE CULTURE: Culture: >=100000 — AB

## 2020-01-05 LAB — GLUCOSE, CAPILLARY
Glucose-Capillary: 244 mg/dL — ABNORMAL HIGH (ref 70–99)
Glucose-Capillary: 271 mg/dL — ABNORMAL HIGH (ref 70–99)
Glucose-Capillary: 243 mg/dL — ABNORMAL HIGH (ref 70–99)
Glucose-Capillary: 233 mg/dL — ABNORMAL HIGH (ref 70–99)

## 2020-01-05 LAB — MAGNESIUM: Magnesium: 1.7 mg/dL (ref 1.7–2.4)

## 2020-01-05 LAB — SARS CORONAVIRUS 2 BY RT PCR (HOSPITAL ORDER, PERFORMED IN ~~LOC~~ HOSPITAL LAB): SARS Coronavirus 2: NEGATIVE

## 2020-01-05 MED ORDER — GABAPENTIN 300 MG PO CAPS
300.0000 mg | ORAL_CAPSULE | Freq: Three times a day (TID) | ORAL | Status: DC
  Administered 2020-01-05 – 2020-01-07 (×8): 300 mg via ORAL
  Filled 2020-01-05 (×6): qty 1
  Filled 2020-01-05: qty 3

## 2020-01-05 MED ORDER — SODIUM CHLORIDE 0.9 % IV SOLN
1.0000 g | Freq: Three times a day (TID) | INTRAVENOUS | Status: DC
  Administered 2020-01-05 – 2020-01-07 (×7): 1 g via INTRAVENOUS
  Filled 2020-01-05 (×9): qty 1

## 2020-01-05 NOTE — Plan of Care (Signed)

## 2020-01-05 NOTE — Progress Notes (Signed)
Physical Therapy Treatment Patient Details Name: Steve Anthony MRN: [id number] DOB: 1950-10-27 Today's Date: 01/05/2020    History of Present Illness The pt is a 69 yo male presenting with multiple pelvic fx after being knocked down by his tractor. Non-op management planned at this point, pending pt mobility progression. Upon work-up pt also found to have UTI, AKI, but all imaging of head/neck were clear. PMH includes: DM II, SVT, a fib, CAD, COPD, DJD, dyslipidemia, HTN, and OSA but not compliant with CPAP. -    PT Comments    The pt is making good progress with PT at this time. He was able to tolerate standing and stepping along EOB without onset of nausea, and was able to improve clearance to allow for stepping with BLE to complete pivot to and from [facility] to bed. The pt will continue to benefit from skilled PT to progress functional strength and stability to allow for improved independence with transfers and improved endurance to allow for initiation of gait progression.     Follow Up Recommendations  SNF;Supervision/Assistance - 24 hour     Equipment Recommendations   (pt well equipped)    Recommendations for Other Services       Precautions / Restrictions Precautions Precautions: Fall Precaution Comments: multiple pelvic fx Restrictions Weight Bearing Restrictions: Yes RLE Weight Bearing: Partial weight bearing RLE Partial Weight Bearing Percentage or Pounds: 50% LLE Weight Bearing: Weight bearing as tolerated    Mobility  Bed Mobility Overal bed mobility: Needs Assistance Bed Mobility: Rolling;Sidelying to Sit;Sit to Sidelying Rolling: Min assist Sidelying to sit: Mod assist     Sit to sidelying: Mod assist;HOB elevated General bed mobility comments: assist for LE, and significant assist to lift trunk.  Transfers Overall transfer level: Needs assistance Equipment used: Rolling walker (2 wheeled) Transfers: Sit to/from Stand Sit to Stand: From elevated surface;Mod  assist;Min assist         General transfer comment: VC for hand placement, initially modA to stand, progressed to minA with heavy reliance on BUE support.  Ambulation/Gait Ambulation/Gait assistance: Mod assist;Min assist Gait Distance (Feet): 3 Feet (+ 3) Assistive device: Rolling walker (2 wheeled) Gait Pattern/deviations: Step-to pattern;Decreased stride length Gait velocity: decreased   General Gait Details: small lateral steps with good maintaining of RLE WB precautions. able to move BLE with at elast minimal clearance. fatigues after ~3 steps   Stairs             Wheelchair Mobility    Modified Rankin (Stroke Patients Only)       Balance Overall balance assessment: Needs assistance Sitting-balance support: Feet supported;Single extremity supported Sitting balance-Leahy Scale: Poor Sitting balance - Comments: requires UE support due to pain   Standing balance support: Bilateral upper extremity supported;During functional activity Standing balance-Leahy Scale: Poor Standing balance comment: requires UE support                            Cognition Arousal/Alertness: Awake/alert Behavior During Therapy: WFL for tasks assessed/performed Overall Cognitive Status: Within Functional Limits for tasks assessed                                        Exercises      General Comments General comments (skin integrity, edema, etc.): VSS - 02 sats 93% on RA      Pertinent Vitals/Pain Pain  Assessment: Faces Faces Pain Scale: Hurts little more Pain Location: L hip, back, groin Pain Descriptors / Indicators: Dull Pain Intervention(s): Limited activity within patient's tolerance;Monitored during session;Repositioned           PT Goals (current goals can now be found in the care plan section) Acute Rehab PT Goals Patient Stated Goal: to be able to take care of self PT Goal Formulation: With patient Time For Goal Achievement:  01/17/20 Potential to Achieve Goals: Good Progress towards PT goals: Progressing toward goals    Frequency    Min 4X/week      PT Plan Current plan remains appropriate       AM-PAC PT "6 Clicks" Mobility   Outcome Measure  Help needed turning from your back to your side while in a flat bed without using bedrails?: A Little Help needed moving from lying on your back to sitting on the side of a flat bed without using bedrails?: A Lot Help needed moving to and from a bed to a chair (including a wheelchair)?: A Lot Help needed standing up from a chair using your arms (e.g., wheelchair or bedside chair)?: A Little Help needed to walk in hospital room?: A Lot Help needed climbing 3-5 steps with a railing? : Total 6 Click Score: 13    End of Session Equipment Utilized During Treatment: Gait belt Activity Tolerance: Patient limited by pain Patient left: in bed;with call bell/phone within reach;with bed alarm set Nurse Communication: Mobility status;Patient requests pain meds PT Visit Diagnosis: Other abnormalities of gait and mobility (R26.89);Pain Pain - Right/Left: Left Pain - part of body: Hip     Time: 5027-7412 PT Time Calculation (min) (ACUTE ONLY): 35 min  Charges:  $Gait Training: 8-22 mins $Therapeutic Activity: 8-22 mins                     Karma Ganja, PT, DPT   Acute Rehabilitation Department Pager #: [phone]    Otho Bellows 01/05/2020, 11:30 AM

## 2020-01-05 NOTE — Progress Notes (Addendum)
PROGRESS NOTE    Steve Anthony  XJO:[id number] DOB: 06/28/1950 DOA: 12/31/2019 PCP: [name], MD   Brief Narrative:  Patient is 69 year old male with past medical history of A. fib-on Eliquis, coronary artery disease status post stent placement, history of idiopathic pancreatitis status post distal pancreatectomy and splenectomy, chronic anemia, COPD, prediabetes, chronic pain syndrome-on chronic opioids presents to emergency department with trauma from home.  He was working on a tractor and the tractor rolled on him and knocked him off.  He was found with multiple pelvic bone fractures and hematoma.  ED course: In the ER x-ray revealed pelvic fracture this was further confirmed with CT pelvis which show 7.6 cm hematoma overlying the left hip.  There was also a small amount of pelvic hematoma.  Patient's labs are significant for acute renal failure and hemoglobin was found 12.5, WBC of 15.7.  CT head was negative for acute findings.  Orthopedic surgery consulted.  Patient admitted for further evaluation and management for pelvic fracture and hematoma.  Assessment & Plan:   Multiple comminuted nondisplaced pelvic fracture: -Orthopedic surgeon consulted recommended medical management-WBAT LLE and 50% WB RLE and follow-up with Dr. Ulanda Edison outpatient. -Continue pain control as needed & tramadol every 6 hours scheduled. -Continue to monitor H&H and vitals closely. -Consult PT/OT recommended SNF  Pelvic hematoma: -Reviewed CT pelvis. -Resumed Eliquis on 12/9 as per Ortho recommendations.  H&H remained stable.   ESBL Klebsiella pneumoniae UTI: Susceptible to meropenem.  DC ciprofloxacin. -Pharmacy consult for meropenem.  Chronic back pain: Associated with neuropathy.  Tells me that he has worsening of his neuropathy especially in his right leg.  Patient and his family requested for MRI.  Gabapentin dose increased from 300 mg twice daily to 3 times daily. -Ordered MRI lumbar spine for  further evaluation.  Paroxysmal A. fib: Rate controlled.   -Continue metoprolol, amiodarone, Eliquis.  Monitor heart rate closely on telemetry.  Coronary artery disease status post stenting: Patient denies ACS symptoms. -Continue fenofibrate, metoprolol  Chronic pain syndrome-on chronic opioids outpatient: Continue oxycodone 10 mg every 4 hours as needed.  COPD: On 2 L via nasal cannula PRN.  Continue home inhalers.  No wheezing noted on exam.  Hypertension: Stable.  Continue metoprolol.  History of idiopathic pancreatitis: Status post distal pancreatectomy and splenectomy.  Depression: Continue Lexapro, trazodone  Hyperlipidemia: Continue fenofibrate  GERD: Continue PPI  BPH: Continue Flomax  Hypomagnesemia: Replenished.   Intertrigo: Antifungal cream twice daily  DVT prophylaxis: SCD Code Status: Full code Family Communication: Patient's wife present at bedside.  Plan of care discussed with patient in length and he verbalized understanding and agreed with it. Disposition Plan: SNF   Consultants:  Orthopedic surgery Procedures:   CT pelvis  Antimicrobials:   None  Status is: Inpatient  Dispo: The patient is from: Home              Anticipated d/c is to: SNF              Anticipated d/c date is: In 1 to 2 days              Patient currently is not medically stable to d/c.    Subjective: Patient seen and examined.  Wife at bedside.  Patient tells me that his pain is overall is better however he has neuropathy-numbness tingling sensation especially in his right leg.  Tells me that he had issues in the past and requested for MRI.  Objective: Vitals:   01/05/20  6599 01/05/20 0738 01/05/20 1007 01/05/20 1149  BP:  115/81 118/70 103/82  Pulse:  72 76 74  Resp:  20  19  Temp: 98.1 F (36.7 C) 97.8 F (36.6 C)  (!) 97.1 F (36.2 C)  TempSrc:  Oral  Oral  SpO2:  92% 92% 94%  Weight:      Height:        Intake/Output Summary (Last 24 hours) at 01/05/2020  1541 Last data filed at 01/05/2020 1152 Gross per 24 hour  Intake 480 ml  Output 2050 ml  Net -1570 ml   Filed Weights   12/31/19 1855  Weight: 103.4 kg    Examination: General exam: Appears calm and comfortable, elderly, on room air, communicating well Respiratory system: Clear to auscultation. Respiratory effort normal. Cardiovascular system: S1 & S2 heard, RRR. No JVD, murmurs, rubs, gallops or clicks. No pedal edema. Gastrointestinal system: Abdomen is nondistended, soft and nontender. No organomegaly or masses felt. Normal bowel sounds heard.  Has condom catheter Central nervous system: Alert and oriented. No focal neurological deficits. Skin: Large bruise noted on left buttock and upper thigh.   Psychiatry: Judgement and insight appear normal. Mood & affect appropriate.   Data Reviewed: I have personally reviewed following labs and imaging studies  CBC: Recent Labs  Lab 01/01/20 0709 01/02/20 0420 01/03/20 0122 01/04/20 0144 01/05/20 0029  WBC 12.7* 11.5* 13.7* 12.8* 11.2*  NEUTROABS  --  7.8*  --   --   --   HGB 12.2* 11.7* 10.5* 10.5* 10.5*  HCT 38.1* 35.8* 31.9* 33.1* 33.1*  MCV 90.9 92.5 91.7 91.2 91.2  PLT 304 256 218 268 357   Basic Metabolic Panel: Recent Labs  Lab 12/31/19 2125 01/01/20 0455 01/02/20 0420 01/03/20 0122 01/04/20 0144 01/05/20 0029  NA 135 135 136 135  --  133*  K 4.5 4.0 4.2 4.4  --  4.4  CL 101 103 103 103  --  97*  CO2 24 23 26 23   --  29  GLUCOSE 164* 118* 153* 216*  --  267*  BUN 12 12 10 10   --  11  CREATININE 1.44* 1.32* 1.21 1.24  --  1.29*  CALCIUM 8.5* 8.0* 8.3* 8.3*  --  8.5*  MG  --   --  1.3* 1.4* 1.6* 1.7   GFR: Estimated Creatinine Clearance: 68.1 mL/min (A) (by C-G formula based on SCr of 1.29 mg/dL (H)). Liver Function Tests: Recent Labs  Lab 01/01/20 0455  AST 20  ALT 14  ALKPHOS 28*  BILITOT 0.8  PROT 4.9*  ALBUMIN 2.8*   No results for input(s): LIPASE, AMYLASE in the last 168 hours. No results  for input(s): AMMONIA in the last 168 hours. Coagulation Profile: No results for input(s): INR, PROTIME in the last 168 hours. Cardiac Enzymes: No results for input(s): CKTOTAL, CKMB, CKMBINDEX, TROPONINI in the last 168 hours. BNP (last 3 results) No results for input(s): PROBNP in the last 8760 hours. HbA1C: No results for input(s): HGBA1C in the last 72 hours. CBG: Recent Labs  Lab 01/04/20 1148 01/04/20 1630 01/04/20 2125 01/05/20 0832 01/05/20 1147  GLUCAP 257* 225* 257* 244* 271*   Lipid Profile: No results for input(s): CHOL, HDL, LDLCALC, TRIG, CHOLHDL, LDLDIRECT in the last 72 hours. Thyroid Function Tests: No results for input(s): TSH, T4TOTAL, FREET4, T3FREE, THYROIDAB in the last 72 hours. Anemia Panel: No results for input(s): VITAMINB12, FOLATE, FERRITIN, TIBC, IRON, RETICCTPCT in the last 72 hours. Sepsis Labs: Recent Labs  Lab 01/01/20 0455 01/01/20 0709  LATICACIDVEN 1.0 0.7    Recent Results (from the past 240 hour(s))  Resp Panel by RT-PCR (Flu A&B, Covid) Nasopharyngeal Swab     Status: None   Collection Time: 12/31/19  9:25 PM   Specimen: Nasopharyngeal Swab; Nasopharyngeal(NP) swabs in vial transport medium  Result Value Ref Range Status   SARS Coronavirus 2 by RT PCR NEGATIVE NEGATIVE Final    Comment: (NOTE) SARS-CoV-2 target nucleic acids are NOT DETECTED.  The SARS-CoV-2 RNA is generally detectable in upper respiratory specimens during the acute phase of infection. The lowest concentration of SARS-CoV-2 viral copies this assay can detect is 138 copies/mL. A negative result does not preclude SARS-Cov-2 infection and should not be used as the sole basis for treatment or other patient management decisions. A negative result may occur with  improper specimen collection/handling, submission of specimen other than nasopharyngeal swab, presence of viral mutation(s) within the areas targeted by this assay, and inadequate number of viral copies(<138  copies/mL). A negative result must be combined with clinical observations, patient history, and epidemiological information. The expected result is Negative.  Fact Sheet for Patients:  EntrepreneurPulse.com.au  Fact Sheet for Healthcare Providers:  IncredibleEmployment.be  This test is no t yet approved or cleared by the [place] FDA and  has been authorized for detection and/or diagnosis of SARS-CoV-2 by FDA under an Emergency Use Authorization (EUA). This EUA will remain  in effect (meaning this test can be used) for the duration of the COVID-19 declaration under Section 564(b)(1) of the Act, 21 U.S.C.section 360bbb-3(b)(1), unless the authorization is terminated  or revoked sooner.       Influenza A by PCR NEGATIVE NEGATIVE Final   Influenza B by PCR NEGATIVE NEGATIVE Final    Comment: (NOTE) The Xpert Xpress SARS-CoV-2/FLU/RSV plus assay is intended as an aid in the diagnosis of influenza from Nasopharyngeal swab specimens and should not be used as a sole basis for treatment. Nasal washings and aspirates are unacceptable for Xpert Xpress SARS-CoV-2/FLU/RSV testing.  Fact Sheet for Patients: EntrepreneurPulse.com.au  Fact Sheet for Healthcare Providers: IncredibleEmployment.be  This test is not yet approved or cleared by the [place] FDA and has been authorized for detection and/or diagnosis of SARS-CoV-2 by FDA under an Emergency Use Authorization (EUA). This EUA will remain in effect (meaning this test can be used) for the duration of the COVID-19 declaration under Section 564(b)(1) of the Act, 21 U.S.C. section 360bbb-3(b)(1), unless the authorization is terminated or revoked.  Performed at [facility], [place] [address], [place], [place] [address]   MRSA PCR Screening     Status: None   Collection Time: 01/01/20  4:09 AM   Specimen: Nasal Mucosa; Nasopharyngeal  Result Value Ref  Range Status   MRSA by PCR NEGATIVE NEGATIVE Final    Comment:        The GeneXpert MRSA Assay (FDA approved for NASAL specimens only), is one component of a comprehensive MRSA colonization surveillance program. It is not intended to diagnose MRSA infection nor to guide or monitor treatment for MRSA infections. Performed at [facility], [place] [address], [place], [place] [address]   Culture, Urine     Status: Abnormal   Collection Time: 01/03/20 12:52 PM   Specimen: Urine, Random  Result Value Ref Range Status   Specimen Description URINE, RANDOM  Final   Special Requests NONE  Final   Culture (A)  Final    >=100,000 COLONIES/mL KLEBSIELLA  PNEUMONIAE Confirmed Extended Spectrum Beta-Lactamase Producer (ESBL).  In bloodstream infections from ESBL organisms, carbapenems are preferred over piperacillin/tazobactam. They are shown to have a lower risk of mortality. MULTI-DRUG RESISTANT ORGANISM RESULT CALLED TO, READ BACK BY AND VERIFIED WITH: RN Catheryn Bacon [id number] Performed at [facility], [address], [place], [place] [address]    Report Status 01/05/2020 FINAL  Final   Organism ID, Bacteria KLEBSIELLA PNEUMONIAE (A)  Final      Susceptibility   Klebsiella pneumoniae - MIC*    AMPICILLIN >=32 RESISTANT Resistant     CEFAZOLIN >=64 RESISTANT Resistant     CEFEPIME >=32 RESISTANT Resistant     CEFTRIAXONE >=64 RESISTANT Resistant     CIPROFLOXACIN 2 INTERMEDIATE Intermediate     GENTAMICIN >=16 RESISTANT Resistant     IMIPENEM <=0.25 SENSITIVE Sensitive     NITROFURANTOIN 128 RESISTANT Resistant     TRIMETH/SULFA >=320 RESISTANT Resistant     AMPICILLIN/SULBACTAM >=32 RESISTANT Resistant     PIP/TAZO 32 INTERMEDIATE Intermediate     * >=100,000 COLONIES/mL KLEBSIELLA PNEUMONIAE  SARS Coronavirus 2 by RT PCR (hospital order, performed in [facility] hospital lab) Nasopharyngeal Nasopharyngeal Swab     Status: None   Collection Time: 01/05/20  5:00 AM    Specimen: Nasopharyngeal Swab  Result Value Ref Range Status   SARS Coronavirus 2 NEGATIVE NEGATIVE Final    Comment: (NOTE) SARS-CoV-2 target nucleic acids are NOT DETECTED.  The SARS-CoV-2 RNA is generally detectable in upper and lower respiratory specimens during the acute phase of infection. The lowest concentration of SARS-CoV-2 viral copies this assay can detect is 250 copies / mL. A negative result does not preclude SARS-CoV-2 infection and should not be used as the sole basis for treatment or other patient management decisions.  A negative result may occur with improper specimen collection / handling, submission of specimen other than nasopharyngeal swab, presence of viral mutation(s) within the areas targeted by this assay, and inadequate number of viral copies (<250 copies / mL). A negative result must be combined with clinical observations, patient history, and epidemiological information.  Fact Sheet for Patients:   StrictlyIdeas.no  Fact Sheet for Healthcare Providers: BankingDealers.co.za  This test is not yet approved or  cleared by the [place] FDA and has been authorized for detection and/or diagnosis of SARS-CoV-2 by FDA under an Emergency Use Authorization (EUA).  This EUA will remain in effect (meaning this test can be used) for the duration of the COVID-19 declaration under Section 564(b)(1) of the Act, 21 U.S.C. section 360bbb-3(b)(1), unless the authorization is terminated or revoked sooner.  Performed at [facility], [place] [address], [place],  [address]       Radiology Studies: No results found.  Scheduled Meds: . amiodarone  200 mg Oral Daily  . apixaban  5 mg Oral BID  . escitalopram  10 mg Oral Daily  . fenofibrate  160 mg Oral Daily  . gabapentin  300 mg Oral TID  . insulin aspart  0-5 Units Subcutaneous QHS  . insulin aspart  0-9 Units Subcutaneous TID WC  . insulin glargine   24 Units Subcutaneous Daily  . ketoconazole   Topical BID  . melatonin  10 mg Oral QHS  . metoprolol tartrate  12.5 mg Oral BID  . montelukast  10 mg Oral Daily  . pantoprazole  40 mg Oral BID  . polyethylene glycol  17 g Oral Daily  . tamsulosin  0.4  mg Oral Daily  . traMADol  50 mg Oral Q6H  . traZODone  100 mg Oral QHS   Continuous Infusions: . meropenem (MERREM) IV 1 g (01/05/20 1147)     LOS: 4 days   Time spent: 40 minutes   Shaunette Gassner Loann Quill, MD [facility]  If 7PM-7AM, please contact night-coverage [email or web address] 01/05/2020, 3:41 PM

## 2020-01-05 NOTE — Progress Notes (Signed)
Pharmacy Antibiotic Note  Steve Anthony is a 69 y.o. male admitted on 12/31/2019 with ESBL kleb pneumo UTI (only sensitive to carbapenems).  Pharmacy has been consulted for meropenem dosing. Noted history of "hives, shortness of breath" reaction to PCN documented in chart but per discussion with Dr. Doristine Bosworth, ok to give meropenem and monitor. SCr 1.29 stable.  Plan: Meropenem 1g IV q8h Monitor clinical progress, c/s, renal function F/u de-escalation plan/LOT   Height: 6' (182.9 cm) Weight: 103.4 kg (228 lb) IBW/kg (Calculated) : 77.6  Temp (24hrs), Avg:98.3 F (36.8 C), Min:97.8 F (36.6 C), Max:98.7 F (37.1 C)  Recent Labs  Lab 12/31/19 2125 01/01/20 0455 01/01/20 0709 01/02/20 0420 01/03/20 0122 01/04/20 0144 01/05/20 0029  WBC 15.7* 11.7* 12.7* 11.5* 13.7* 12.8* 11.2*  CREATININE 1.44* 1.32*  --  1.21 1.24  --  1.29*  LATICACIDVEN  --  1.0 0.7  --   --   --   --     Estimated Creatinine Clearance: 68.1 mL/min (A) (by C-G formula based on SCr of 1.29 mg/dL (H)).    Allergies  Allergen Reactions  . Penicillins Hives and Shortness Of Breath    Did it involve swelling of the face/tongue/throat, SOB, or low BP? Yes Did it involve sudden or severe rash/hives, skin peeling, or any reaction on the inside of your mouth or nose? No Did you need to seek medical attention at a hospital or doctor's office? Yes (emergency room) When did it last happen?1974 If all above answers are "NO", may proceed with cephalosporin use.      Arturo Morton, PharmD, BCPS Please check AMION for all [place] contact numbers Clinical Pharmacist 01/05/2020 10:37 AM

## 2020-01-06 ENCOUNTER — Inpatient Hospital Stay: Payer: Self-pay

## 2020-01-06 LAB — CBC
HCT: 34.5 % — ABNORMAL LOW (ref 39.0–52.0)
Hemoglobin: 11.1 g/dL — ABNORMAL LOW (ref 13.0–17.0)
MCH: 29 pg (ref 26.0–34.0)
MCHC: 32.2 g/dL (ref 30.0–36.0)
MCV: 90.1 fL (ref 80.0–100.0)
Platelets: 348 10*3/uL (ref 150–400)
RBC: 3.83 MIL/uL — ABNORMAL LOW (ref 4.22–5.81)
RDW: 14.2 % (ref 11.5–15.5)
WBC: 12.4 10*3/uL — ABNORMAL HIGH (ref 4.0–10.5)
nRBC: 0 % (ref 0.0–0.2)

## 2020-01-06 LAB — GLUCOSE, CAPILLARY
Glucose-Capillary: 210 mg/dL — ABNORMAL HIGH (ref 70–99)
Glucose-Capillary: 255 mg/dL — ABNORMAL HIGH (ref 70–99)
Glucose-Capillary: 238 mg/dL — ABNORMAL HIGH (ref 70–99)
Glucose-Capillary: 221 mg/dL — ABNORMAL HIGH (ref 70–99)

## 2020-01-06 MED ORDER — DIPHENHYDRAMINE HCL 25 MG PO CAPS
25.0000 mg | ORAL_CAPSULE | Freq: Four times a day (QID) | ORAL | Status: DC | PRN
  Administered 2020-01-06 (×2): 25 mg via ORAL
  Filled 2020-01-06 (×2): qty 1

## 2020-01-06 NOTE — Progress Notes (Signed)
Spoke to [employer], states he will be discharged on 12/13. She's aware PICC will be done in the morning. [employer]

## 2020-01-06 NOTE — Progress Notes (Signed)
PROGRESS NOTE    KABIR BRANNOCK  VZS:[id number] DOB: [birth year] DOA: 12/31/2019 PCP: [name], MD   Brief Narrative:  Patient is 69 year old male with past medical history of A. fib-on Eliquis, coronary artery disease status post stent placement, history of idiopathic pancreatitis status post distal pancreatectomy and splenectomy, chronic anemia, COPD, prediabetes, chronic pain syndrome-on chronic opioids presents to emergency department with trauma from home.  He was working on a tractor and the tractor rolled on him and knocked him off.  He was found with multiple pelvic bone fractures and hematoma.  ED course: In the ER x-ray revealed pelvic fracture this was further confirmed with CT pelvis which show 7.6 cm hematoma overlying the left hip.  There was also a small amount of pelvic hematoma.  Patient's labs are significant for acute renal failure and hemoglobin was found 12.5, WBC of 15.7.  CT head was negative for acute findings.  Orthopedic surgery consulted.  Patient admitted for further evaluation and management for pelvic fracture and hematoma.  Assessment & Plan:   Multiple comminuted nondisplaced pelvic fracture: -Orthopedic surgeon consulted recommended medical management-WBAT LLE and 50% WB RLE and follow-up with Dr. Ulanda Edison outpatient. -Continue pain control as needed & tramadol every 6 hours scheduled. -Continue to monitor H&H and vitals closely. -Consult PT/OT recommended SNF  Pelvic hematoma: -Reviewed CT pelvis. -Resumed Eliquis on 12/9 as per Ortho recommendations.  H&H remained stable.   ESBL Klebsiella pneumoniae UTI: Susceptible to meropenem.  DC ciprofloxacin. -Cont. meropenem.  Remained afebrile.  Leukocytosis trended up from 11.2-12.4 this morning.  Chronic back pain:  -Continue gabapentin 300 mg 3 times daily -MRI lumbar spine shows stable lumbar fusion hardware at L5-S1, no significant lumbar disc protrusion, spinal or foraminal stenosis.  No acute lumbar  fractures. -Continue with PT/OT  Paroxysmal A. fib: Rate controlled.   -Continue metoprolol, amiodarone, Eliquis.  Monitor heart rate closely on telemetry.  Coronary artery disease status post stenting: Patient denies ACS symptoms. -Continue fenofibrate, metoprolol  Chronic pain syndrome-on chronic opioids outpatient: Continue oxycodone 10 mg every 4 hours as needed.  COPD: On 2 L via nasal cannula PRN.  Continue home inhalers.  No wheezing noted on exam.  Hypertension: Stable.  Continue metoprolol.  History of idiopathic pancreatitis: Status post distal pancreatectomy and splenectomy.  Depression: Continue Lexapro, trazodone  Hyperlipidemia: Continue fenofibrate  GERD: Continue PPI  BPH: Continue Flomax  Hypomagnesemia: Replenished.   Intertrigo: Antifungal cream twice daily  Disposition: Patient symptoms has improved.  He has bed available at SNF however he needs IV antibiotics for 7 days for ESBL UTI infection and due to limited pharmacy hours on the weekends at SNF he will be probably discharge tomorrow.    DVT prophylaxis: SCD Code Status: Full code Family Communication: Patient's wife present at bedside.  Plan of care discussed with patient in length and he verbalized understanding and agreed with it. Disposition Plan: SNF   Consultants:  Orthopedic surgery Procedures:   CT pelvis  Antimicrobials:   None  Status is: Inpatient  Dispo: The patient is from: Home              Anticipated d/c is to: SNF              Anticipated d/c date is: In 1 to 2 days              Patient currently is not medically stable to d/c.    Subjective: Patient seen and examined.  Tells me that  his pain is well controlled this morning.  He has itching on his back.  Denies any other symptoms including chest pain, shortness of breath, nausea, vomiting, abdominal pain, fever or chills.  Objective: Vitals:   01/05/20 2030 01/06/20 0025 01/06/20 0430 01/06/20 0729  BP: 117/78  106/72 101/74 120/76  Pulse: 78 76 67 77  Resp:  17 13 20   Temp: 99.9 F (37.7 C) 98.4 F (36.9 C) 98.4 F (36.9 C) 98.5 F (36.9 C)  TempSrc:  Oral Oral   SpO2: (!) 88% 91% 93% 92%  Weight:      Height:        Intake/Output Summary (Last 24 hours) at 01/06/2020 1125 Last data filed at 01/06/2020 0800 Gross per 24 hour  Intake 1060 ml  Output 2900 ml  Net -1840 ml   Filed Weights   12/31/19 1855  Weight: 103.4 kg    Examination: General exam: Appears calm and comfortable, elderly, on 2 L of oxygen via nasal cannula, communicating well Respiratory system: Clear to auscultation. Respiratory effort normal. Cardiovascular system: S1 & S2 heard, RRR. No JVD, murmurs, rubs, gallops or clicks. No pedal edema. Gastrointestinal system: Abdomen is nondistended, soft and nontender. No organomegaly or masses felt. Normal bowel sounds heard. Central nervous system: Alert and oriented. No focal neurological deficits. Extremities: Symmetric 5 x 5 power. Skin: Erythematous skin on the back.   Psychiatry: Judgement and insight appear normal. Mood & affect appropriate.   Data Reviewed: I have personally reviewed following labs and imaging studies  CBC: Recent Labs  Lab 01/02/20 0420 01/03/20 0122 01/04/20 0144 01/05/20 0029 01/06/20 0124  WBC 11.5* 13.7* 12.8* 11.2* 12.4*  NEUTROABS 7.8*  --   --   --   --   HGB 11.7* 10.5* 10.5* 10.5* 11.1*  HCT 35.8* 31.9* 33.1* 33.1* 34.5*  MCV 92.5 91.7 91.2 91.2 90.1  PLT 256 218 268 319 309   Basic Metabolic Panel: Recent Labs  Lab 12/31/19 2125 01/01/20 0455 01/02/20 0420 01/03/20 0122 01/04/20 0144 01/05/20 0029  NA 135 135 136 135  --  133*  K 4.5 4.0 4.2 4.4  --  4.4  CL 101 103 103 103  --  97*  CO2 24 23 26 23   --  29  GLUCOSE 164* 118* 153* 216*  --  267*  BUN 12 12 10 10   --  11  CREATININE 1.44* 1.32* 1.21 1.24  --  1.29*  CALCIUM 8.5* 8.0* 8.3* 8.3*  --  8.5*  MG  --   --  1.3* 1.4* 1.6* 1.7   GFR: Estimated  Creatinine Clearance: 68.1 mL/min (A) (by C-G formula based on SCr of 1.29 mg/dL (H)). Liver Function Tests: Recent Labs  Lab 01/01/20 0455  AST 20  ALT 14  ALKPHOS 28*  BILITOT 0.8  PROT 4.9*  ALBUMIN 2.8*   No results for input(s): LIPASE, AMYLASE in the last 168 hours. No results for input(s): AMMONIA in the last 168 hours. Coagulation Profile: No results for input(s): INR, PROTIME in the last 168 hours. Cardiac Enzymes: No results for input(s): CKTOTAL, CKMB, CKMBINDEX, TROPONINI in the last 168 hours. BNP (last 3 results) No results for input(s): PROBNP in the last 8760 hours. HbA1C: No results for input(s): HGBA1C in the last 72 hours. CBG: Recent Labs  Lab 01/05/20 1147 01/05/20 1607 01/05/20 2153 01/06/20 0728 01/06/20 1105  GLUCAP 271* 243* 233* 210* 255*   Lipid Profile: No results for input(s): CHOL, HDL, LDLCALC, TRIG, CHOLHDL, LDLDIRECT  in the last 72 hours. Thyroid Function Tests: No results for input(s): TSH, T4TOTAL, FREET4, T3FREE, THYROIDAB in the last 72 hours. Anemia Panel: No results for input(s): VITAMINB12, FOLATE, FERRITIN, TIBC, IRON, RETICCTPCT in the last 72 hours. Sepsis Labs: Recent Labs  Lab 01/01/20 0455 01/01/20 0709  LATICACIDVEN 1.0 0.7    Recent Results (from the past 240 hour(s))  Resp Panel by RT-PCR (Flu A&B, Covid) Nasopharyngeal Swab     Status: None   Collection Time: 12/31/19  9:25 PM   Specimen: Nasopharyngeal Swab; Nasopharyngeal(NP) swabs in vial transport medium  Result Value Ref Range Status   SARS Coronavirus 2 by RT PCR NEGATIVE NEGATIVE Final    Comment: (NOTE) SARS-CoV-2 target nucleic acids are NOT DETECTED.  The SARS-CoV-2 RNA is generally detectable in upper respiratory specimens during the acute phase of infection. The lowest concentration of SARS-CoV-2 viral copies this assay can detect is 138 copies/mL. A negative result does not preclude SARS-Cov-2 infection and should not be used as the sole basis  for treatment or other patient management decisions. A negative result may occur with  improper specimen collection/handling, submission of specimen other than nasopharyngeal swab, presence of viral mutation(s) within the areas targeted by this assay, and inadequate number of viral copies(<138 copies/mL). A negative result must be combined with clinical observations, patient history, and epidemiological information. The expected result is Negative.  Fact Sheet for Patients:  EntrepreneurPulse.com.au  Fact Sheet for Healthcare Providers:  IncredibleEmployment.be  This test is no t yet approved or cleared by the [place] FDA and  has been authorized for detection and/or diagnosis of SARS-CoV-2 by FDA under an Emergency Use Authorization (EUA). This EUA will remain  in effect (meaning this test can be used) for the duration of the COVID-19 declaration under Section 564(b)(1) of the Act, 21 U.S.C.section 360bbb-3(b)(1), unless the authorization is terminated  or revoked sooner.       Influenza A by PCR NEGATIVE NEGATIVE Final   Influenza B by PCR NEGATIVE NEGATIVE Final    Comment: (NOTE) The Xpert Xpress SARS-CoV-2/FLU/RSV plus assay is intended as an aid in the diagnosis of influenza from Nasopharyngeal swab specimens and should not be used as a sole basis for treatment. Nasal washings and aspirates are unacceptable for Xpert Xpress SARS-CoV-2/FLU/RSV testing.  Fact Sheet for Patients: EntrepreneurPulse.com.au  Fact Sheet for Healthcare Providers: IncredibleEmployment.be  This test is not yet approved or cleared by the [place] FDA and has been authorized for detection and/or diagnosis of SARS-CoV-2 by FDA under an Emergency Use Authorization (EUA). This EUA will remain in effect (meaning this test can be used) for the duration of the COVID-19 declaration under Section 564(b)(1) of the Act, 21  U.S.C. section 360bbb-3(b)(1), unless the authorization is terminated or revoked.  Performed at [facility], [place] [address] [place], [place] [address]   MRSA PCR Screening     Status: None   Collection Time: 01/01/20  4:09 AM   Specimen: Nasal Mucosa; Nasopharyngeal  Result Value Ref Range Status   MRSA by PCR NEGATIVE NEGATIVE Final    Comment:        The GeneXpert MRSA Assay (FDA approved for NASAL specimens only), is one component of a comprehensive MRSA colonization surveillance program. It is not intended to diagnose MRSA infection nor to guide or monitor treatment for MRSA infections. Performed at [facility], [place] [address] [place], [place] [address]   Culture, Urine     Status: Abnormal  Collection Time: 01/03/20 12:52 PM   Specimen: Urine, Random  Result Value Ref Range Status   Specimen Description URINE, RANDOM  Final   Special Requests NONE  Final   Culture (A)  Final    >=100,000 COLONIES/mL KLEBSIELLA PNEUMONIAE Confirmed Extended Spectrum Beta-Lactamase Producer (ESBL).  In bloodstream infections from ESBL organisms, carbapenems are preferred over piperacillin/tazobactam. They are shown to have a lower risk of mortality. MULTI-DRUG RESISTANT ORGANISM RESULT CALLED TO, READ BACK BY AND VERIFIED WITH: RN Catheryn Bacon [id number] MLM Performed at [facility], [address] [place], [place] [address]    Report Status 01/05/2020 FINAL  Final   Organism ID, Bacteria KLEBSIELLA PNEUMONIAE (A)  Final      Susceptibility   Klebsiella pneumoniae - MIC*    AMPICILLIN >=32 RESISTANT Resistant     CEFAZOLIN >=64 RESISTANT Resistant     CEFEPIME >=32 RESISTANT Resistant     CEFTRIAXONE >=64 RESISTANT Resistant     CIPROFLOXACIN 2 INTERMEDIATE Intermediate     GENTAMICIN >=16 RESISTANT Resistant     IMIPENEM <=0.25 SENSITIVE Sensitive     NITROFURANTOIN 128 RESISTANT Resistant     TRIMETH/SULFA >=320 RESISTANT Resistant     AMPICILLIN/SULBACTAM  >=32 RESISTANT Resistant     PIP/TAZO 32 INTERMEDIATE Intermediate     * >=100,000 COLONIES/mL KLEBSIELLA PNEUMONIAE  SARS Coronavirus 2 by RT PCR (hospital order, performed in [place] hospital lab) Nasopharyngeal Nasopharyngeal Swab     Status: None   Collection Time: 01/05/20  5:00 AM   Specimen: Nasopharyngeal Swab  Result Value Ref Range Status   SARS Coronavirus 2 NEGATIVE NEGATIVE Final    Comment: (NOTE) SARS-CoV-2 target nucleic acids are NOT DETECTED.  The SARS-CoV-2 RNA is generally detectable in upper and lower respiratory specimens during the acute phase of infection. The lowest concentration of SARS-CoV-2 viral copies this assay can detect is 250 copies / mL. A negative result does not preclude SARS-CoV-2 infection and should not be used as the sole basis for treatment or other patient management decisions.  A negative result may occur with improper specimen collection / handling, submission of specimen other than nasopharyngeal swab, presence of viral mutation(s) within the areas targeted by this assay, and inadequate number of viral copies (<250 copies / mL). A negative result must be combined with clinical observations, patient history, and epidemiological information.  Fact Sheet for Patients:   StrictlyIdeas.no  Fact Sheet for Healthcare Providers: BankingDealers.co.za  This test is not yet approved or  cleared by the [place] FDA and has been authorized for detection and/or diagnosis of SARS-CoV-2 by FDA under an Emergency Use Authorization (EUA).  This EUA will remain in effect (meaning this test can be used) for the duration of the COVID-19 declaration under Section 564(b)(1) of the Act, 21 U.S.C. section 360bbb-3(b)(1), unless the authorization is terminated or revoked sooner.  Performed at [facility], [place] [address] [place], [place] [address]       Radiology Studies: MR LUMBAR SPINE WO  CONTRAST  Result Date: 01/05/2020 CLINICAL DATA:  [place] from a tractor.  Pelvic fractures.  Back pain. EXAM: MRI LUMBAR SPINE WITHOUT CONTRAST TECHNIQUE: Multiplanar, multisequence MR imaging of the lumbar spine was performed. No intravenous contrast was administered. COMPARISON:  MRI 04/22/2017 FINDINGS: Segmentation: There are five lumbar type vertebral bodies. The last full intervertebral disc space is labeled L5-S1. This correlates with the prior MRI examination. Alignment:  Normal Vertebrae: Stable lumbar fusion hardware at L5-S1 with  pedicle screws, posterior rods and interbody fusion device. No complicating features are identified. No acute lumbar fracture or bone lesion. Conus medullaris and cauda equina: Conus extends to the L1-2 level. Conus and cauda equina appear normal. Paraspinal and other soft tissues: No significant paraspinal or retroperitoneal findings. Disc levels: No significant lumbar disc protrusions, spinal or foraminal stenosis. No change when compared to the prior examination. IMPRESSION: 1. Stable lumbar fusion hardware at L5-S1. No complicating features. 2. No significant lumbar disc protrusions, spinal or foraminal stenosis. 3. No acute lumbar fractures. Electronically Signed   By: Marijo Sanes M.D.   On: 01/05/2020 19:45    Scheduled Meds: . amiodarone  200 mg Oral Daily  . apixaban  5 mg Oral BID  . escitalopram  10 mg Oral Daily  . fenofibrate  160 mg Oral Daily  . gabapentin  300 mg Oral TID  . insulin aspart  0-5 Units Subcutaneous QHS  . insulin aspart  0-9 Units Subcutaneous TID WC  . insulin glargine  24 Units Subcutaneous Daily  . ketoconazole   Topical BID  . melatonin  10 mg Oral QHS  . metoprolol tartrate  12.5 mg Oral BID  . montelukast  10 mg Oral Daily  . pantoprazole  40 mg Oral BID  . polyethylene glycol  17 g Oral Daily  . tamsulosin  0.4 mg Oral Daily  . traMADol  50 mg Oral Q6H  . traZODone  100 mg Oral QHS   Continuous Infusions: . meropenem  (MERREM) IV 1 g (01/06/20 1033)     LOS: 5 days   Time spent: 40 minutes   Phinley Schall Loann Quill, MD [facility]  If 7PM-7AM, please contact night-coverage [email or web address] 01/06/2020, 11:25 AM

## 2020-01-07 LAB — CBC
HCT: 34.8 % — ABNORMAL LOW (ref 39.0–52.0)
Hemoglobin: 11.5 g/dL — ABNORMAL LOW (ref 13.0–17.0)
MCH: 29.9 pg (ref 26.0–34.0)
MCHC: 33 g/dL (ref 30.0–36.0)
MCV: 90.4 fL (ref 80.0–100.0)
Platelets: 382 10*3/uL (ref 150–400)
RBC: 3.85 MIL/uL — ABNORMAL LOW (ref 4.22–5.81)
RDW: 14.4 % (ref 11.5–15.5)
WBC: 10.7 10*3/uL — ABNORMAL HIGH (ref 4.0–10.5)
nRBC: 0 % (ref 0.0–0.2)

## 2020-01-07 LAB — BASIC METABOLIC PANEL
Anion gap: 9 (ref 5–15)
BUN: 18 mg/dL (ref 8–23)
CO2: 28 mmol/L (ref 22–32)
Calcium: 8.9 mg/dL (ref 8.9–10.3)
Chloride: 96 mmol/L — ABNORMAL LOW (ref 98–111)
Creatinine, Ser: 1.29 mg/dL — ABNORMAL HIGH (ref 0.61–1.24)
GFR, Estimated: >60 mL/min (ref >60)
Glucose, Bld: 217 mg/dL — ABNORMAL HIGH (ref 70–99)
Potassium: 4.9 mmol/L (ref 3.5–5.1)
Sodium: 133 mmol/L — ABNORMAL LOW (ref 135–145)

## 2020-01-07 LAB — SARS CORONAVIRUS 2 BY RT PCR (HOSPITAL ORDER, PERFORMED IN ~~LOC~~ HOSPITAL LAB): SARS Coronavirus 2: NEGATIVE

## 2020-01-07 LAB — GLUCOSE, CAPILLARY
Glucose-Capillary: 229 mg/dL — ABNORMAL HIGH (ref 70–99)
Glucose-Capillary: 272 mg/dL — ABNORMAL HIGH (ref 70–99)
Glucose-Capillary: 234 mg/dL — ABNORMAL HIGH (ref 70–99)

## 2020-01-07 LAB — MAGNESIUM: Magnesium: 1.6 mg/dL — ABNORMAL LOW (ref 1.7–2.4)

## 2020-01-07 MED ORDER — POLYETHYLENE GLYCOL 3350 17 G PO PACK: 17.0000 g | PACK | Freq: Every day | ORAL | 0 refills | Status: DC

## 2020-01-07 MED ORDER — SODIUM CHLORIDE 0.9% FLUSH
10.0000 mL | Freq: Two times a day (BID) | INTRAVENOUS | Status: DC
  Administered 2020-01-07: 10:00:00 10 mL

## 2020-01-07 MED ORDER — CHLORHEXIDINE GLUCONATE CLOTH 2 % EX PADS
6.0000 | MEDICATED_PAD | Freq: Every day | CUTANEOUS | Status: DC
  Administered 2020-01-07: 10:00:00 6 via TOPICAL

## 2020-01-07 MED ORDER — FOSFOMYCIN TROMETHAMINE 3 G PO PACK: 3.0000 g | PACK | Freq: Once | ORAL | 0 refills | Status: AC

## 2020-01-07 MED ORDER — TRAMADOL HCL 50 MG PO TABS: 50.0000 mg | ORAL_TABLET | Freq: Four times a day (QID) | ORAL | 0 refills | Status: DC | PRN

## 2020-01-07 MED ORDER — MAGNESIUM SULFATE 2 GM/50ML IV SOLN
2.0000 g | Freq: Once | INTRAVENOUS | Status: AC
  Administered 2020-01-07: 10:00:00 2 g via INTRAVENOUS
  Filled 2020-01-07: qty 50

## 2020-01-07 MED ORDER — DOCUSATE SODIUM 100 MG PO CAPS: 100.0000 mg | ORAL_CAPSULE | Freq: Two times a day (BID) | ORAL | 0 refills | Status: DC | PRN

## 2020-01-07 MED ORDER — GABAPENTIN 300 MG PO CAPS: 300.0000 mg | ORAL_CAPSULE | Freq: Three times a day (TID) | ORAL | 0 refills | Status: DC

## 2020-01-07 MED ORDER — CHLORHEXIDINE GLUCONATE CLOTH 2 % EX PADS: 6.0000 | MEDICATED_PAD | Freq: Every day | CUTANEOUS | Status: DC

## 2020-01-07 MED ORDER — DIPHENHYDRAMINE HCL 25 MG PO CAPS: 25.0000 mg | ORAL_CAPSULE | Freq: Four times a day (QID) | ORAL | 0 refills | Status: DC | PRN

## 2020-01-07 MED ORDER — SODIUM CHLORIDE 0.9% FLUSH: 10.0000 mL | INTRAVENOUS | Status: DC | PRN

## 2020-01-07 MED ORDER — SODIUM CHLORIDE 0.9 % IV SOLN
1.0000 g | Freq: Once | INTRAVENOUS | Status: AC
  Administered 2020-01-07: 13:00:00 1000 mg via INTRAVENOUS
  Filled 2020-01-07: qty 1

## 2020-01-07 NOTE — Progress Notes (Signed)
Peripherally Inserted Central Catheter Placement  The IV Nurse has discussed with the patient and/or persons authorized to consent for the patient, the purpose of this procedure and the potential benefits and risks involved with this procedure.  The benefits include less needle sticks, lab draws from the catheter, and the patient may be discharged home with the catheter. Risks include, but not limited to, infection, bleeding, blood clot (thrombus formation), and puncture of an artery; nerve damage and irregular heartbeat and possibility to perform a PICC exchange if needed/ordered by physician.  Alternatives to this procedure were also discussed.  Bard Power PICC patient education guide, fact sheet on infection prevention and patient information card has been provided to patient /or left at bedside.    PICC Placement Documentation  PICC Single Lumen 93/26/71 PICC Right Basilic 39 cm 0 cm (Active)  Indication for Insertion or Continuance of Line Prolonged intravenous therapies 01/07/20 0848  Exposed Catheter (cm) 0 cm 01/07/20 0848  Site Assessment Clean;Dry;Intact 01/07/20 0848  Line Status Flushed;Blood return noted;Saline locked 01/07/20 0848  Dressing Type Transparent 01/07/20 0848  Dressing Status Clean;Dry;Intact 01/07/20 0848  Antimicrobial disc in place? Yes 01/07/20 0848  Dressing Change Due 01/14/20 01/07/20 0848       Frances Maywood 01/07/2020, 8:50 AM

## 2020-01-07 NOTE — TOC Transition Note (Signed)
Transition of Care [facility]) - CM/SW Discharge Note   Patient Details  Name: Steve Anthony MRN: [id number] Date of Birth: 06/30/1950  Transition of Care [facility]) CM/SW Contact:  Trula Ore, [place] Phone Number: 01/07/2020, 12:43 PM   Clinical Narrative:     Patient will DC to: [facility] [place]  Anticipated DC date: 01/07/2020  Family notified: Steve Anthony   Transport by: Steve Anthony  ?  Per MD patient ready for DC to [facility] [place] . RN, patient, patient's family, and facility notified of DC. Discharge Summary sent to facility. RN given number for report tele#[phone]. DC packet on chart. Ambulance transport requested for patient.  CSW signing off.    Final next level of care: Skilled Nursing Facility Barriers to Discharge: No Barriers Identified   Patient Goals and CMS Choice Patient states their goals for this hospitalization and ongoing recovery are:: to go to SNF CMS Medicare.gov Compare Post Acute Care list provided to:: Other (Comment Required) (spouse Steve Anthony) Choice offered to / list presented to : Patient,Spouse Steve Anthony)  Discharge Placement              Patient chooses bed at: [facility], [place] Patient to be transferred to facility by: [place] Name of family member notified: Steve Anthony Patient and family notified of of transfer: 01/07/20  Discharge Plan and Services In-house Referral: Clinical Social Work                                   Social Determinants of Health ([place]) Interventions     Readmission Risk Interventions Readmission Risk Prevention Plan 01/04/2020 06/18/2019 06/12/2019  Transportation Screening Complete - Complete  PCP or Specialist Appt within 5-7 Days - - Complete  PCP or Specialist Appt within 3-5 Days Complete Complete -  Home Care Screening - - Complete  Medication Review (RN CM) - - Referral to Pharmacy  [place] or [place] - Complete -  Social Work Consult for [place] Planning/Counseling - Complete -   [place] Screening Not Applicable Not Applicable -  Medication Review (RN Care Manager) Complete Complete -  Some recent data might be hidden

## 2020-01-07 NOTE — Progress Notes (Signed)
Patient discharged to [facility], vital signs taken prior to d/c, PIV discontinued, with PICC line in place. Pt room checked for belongings, all belongings with pt.

## 2020-01-07 NOTE — Discharge Summary (Signed)
Physician Discharge Summary  KAMARII CARTON WYO:[id number] DOB: May 14, 1950 DOA: 12/31/2019  PCP: [name], MD  Admit date: 12/31/2019 Discharge date: 01/07/2020  Admitted From: Home  Disposition:  SNF  Recommendations for Outpatient Follow-up:  1. Follow-up with PCP after discharge from SNF 2. Repeat CBC, magnesium and BMP on follow-up visit 3. Follow-up with Dr. Marcelino Scot in 2 weeks 4. Take Benadryl as needed for itching 5. Tramadol as needed for pain control 6. Take fosfomycin 3 g p.o. once tomorrow for UTI.  Home Health: None Equipment/Devices: None Discharge Condition: Stable CODE STATUS: Full code Diet recommendation:  Low-sodium diet  Brief/Interim Summary: Patient is 69 year old male with past medical history of A. fib-on Eliquis, coronary artery disease status post stent placement, history of idiopathic pancreatitis status post distal pancreatectomy and splenectomy, chronic anemia, COPD, prediabetes, chronic pain syndrome-on chronic opioids presents to emergency department with trauma from home.  He was working on a tractor and the tractor rolled on him and knocked him off.  He was found with multiple pelvic bone fractures and hematoma.  ED course: In the ER x-ray revealed pelvic fracture this was further confirmed with CT pelvis which show 7.6 cm hematoma overlying the left hip.  There was also a small amount of pelvic hematoma.  Patient's labs are significant for acute renal failure and hemoglobin was found 12.5, WBC of 15.7.  CT head was negative for acute findings.  Orthopedic surgery consulted.  Patient admitted for further evaluation and management for pelvic fracture and hematoma.  Multiple comminuted nondisplaced pelvic fracture: -Orthopedic surgeon consulted recommended medical management-WBAT LLE and 50% WB RLE and follow-up with Dr. Marcelino Scot outpatient. -Continued pain control as needed & tramadol every 6 hours scheduled. -Continued to monitor H&H and vitals  closely. -Consult PT/OT recommended SNF  Pelvic hematoma: -Reviewed CT pelvis. -Resumed Eliquis on 12/9 as per Ortho recommendations.  H&H remained stable.   ESBL Klebsiella pneumoniae UTI: Susceptible to meropenem.  DC ciprofloxacin. -Started on IV meropenem (received for 3 days).  Remained afebrile.  Leukocytosis: Improved -Discussed with pharmacy.  Will give a dose of IV ertapenem today and discharge on fosfomycin 3 g p.o. once which she will take tomorrow AM.  Chronic back pain:  -Continued gabapentin 300 mg 3 times daily -MRI lumbar spine shows stable lumbar fusion hardware at L5-S1, no significant lumbar disc protrusion, spinal or foraminal stenosis.  No acute lumbar fractures. -Continue with PT/OT  Paroxysmal A. fib: Rate controlled.   -Continued metoprolol, amiodarone, Eliquis.  Monitored heart rate closely on telemetry.  Coronary artery disease status post stenting: Patient denied ACS symptoms. -Continued fenofibrate, metoprolol  Chronic pain syndrome-on chronic opioids outpatient: Continued oxycodone 10 mg every 4 hours as needed.  COPD: On 2 L via nasal cannula PRN.  Continued home inhalers.  No wheezing noted on exam.  Hypertension: Stable.  Continued metoprolol.  History of idiopathic pancreatitis: Status post distal pancreatectomy and splenectomy.  Depression: Continued Lexapro, trazodone  Hyperlipidemia: Continued fenofibrate  GERD: Continued PPI  BPH: Continued Flomax  Hypomagnesemia: Replenished.   Intertrigo: Antifungal cream twice daily.  Resolved  Patient stable at the time of discharge.  Call patient's daughter and discussed about discharge plan and she verbalized understanding.  Discharge Diagnoses:  Multiple comminuted nondisplaced pelvic fracture Pelvic hematoma ESBL Klebsiella pneumonia UTI Chronic back pain Paroxysmal A. fib Coronary artery disease status post stent placement Chronic pain syndrome COPD Hypertension History  of idiopathic pancreatitis Depression Hyperlipidemia GERD BPH Hypomagnesemia Intertrigo   Discharge Instructions  Discharge Instructions    Diet - low sodium heart healthy   Complete by: As directed    Discharge instructions   Complete by: As directed    Follow-up with PCP after discharge from SNF Follow-up with Dr. Marcelino Scot in 2 weeks Repeat CBC and BMP on follow-up visit Take fosfomycin 3 g p.o. once tomorrow Benadryl as needed for itching Tramadol as needed for pain   Increase activity slowly   Complete by: As directed      Allergies as of 01/07/2020      Reactions   Penicillins Hives, Shortness Of Breath   Did it involve swelling of the face/tongue/throat, SOB, or low BP? Yes Did it involve sudden or severe rash/hives, skin peeling, or any reaction on the inside of your mouth or nose? No Did you need to seek medical attention at a hospital or doctor's office? Yes (emergency room) When did it last happen?1974 If all above answers are "NO", may proceed with cephalosporin use.      Medication List    TAKE these medications   acetaminophen 500 MG tablet Commonly known as: TYLENOL Take 1,000 mg by mouth every 6 (six) hours as needed for mild pain, fever or headache.   amiodarone 200 MG tablet Commonly known as: PACERONE Take 200 mg by mouth daily.   DERMACLOUD EX Apply 1 application topically as needed (irritation).   diphenhydrAMINE 25 mg capsule Commonly known as: BENADRYL Take 1 capsule (25 mg total) by mouth every 6 (six) hours as needed for itching.   docusate sodium 100 MG capsule Commonly known as: COLACE Take 1 capsule (100 mg total) by mouth 2 (two) times daily as needed for mild constipation. What changed:   when to take this  reasons to take this   Eliquis 5 MG Tabs tablet Generic drug: apixaban TAKE 1 TABLET BY MOUTH TWICE A DAY   escitalopram 10 MG tablet Commonly known as: LEXAPRO Take 10 mg by mouth daily.   fenofibrate 160 MG  tablet Take 160 mg by mouth daily.   ferrous sulfate 325 (65 FE) MG tablet Take 325 mg by mouth See admin instructions. Monday Wednesday Friday   fosfomycin 3 g Pack Commonly known as: MONUROL Take 3 g by mouth once for 1 dose. Start taking on: January 08, 2020   gabapentin 300 MG capsule Commonly known as: NEURONTIN Take 1 capsule (300 mg total) by mouth 3 (three) times daily. What changed: when to take this   Insulin Pen Needle 29G X 5MM Misc 1 Device by Does not apply route 3 (three) times daily as needed (for use with insulin pen).   Januvia 50 MG tablet Generic drug: sitaGLIPtin Take 50 mg by mouth daily.   Lantus SoloStar 100 UNIT/ML Solostar Pen Generic drug: insulin glargine Inject 24 Units into the skin at bedtime.   losartan 50 MG tablet Commonly known as: COZAAR Take 1 tablet by mouth daily.   Melatonin 10 MG Tabs Take 20 mg by mouth at bedtime.   metFORMIN 500 MG tablet Commonly known as: GLUCOPHAGE Take 500 mg by mouth 2 (two) times daily.   metoprolol tartrate 25 MG tablet Commonly known as: LOPRESSOR TAKE 0.5 TABLETS (12.5 MG TOTAL) BY MOUTH 2 (TWO) TIMES DAILY. What changed: See the new instructions.   midodrine 10 MG tablet Commonly known as: PROAMATINE Take 0.5 tablets (5 mg total) by mouth 3 (three) times daily with meals.   montelukast 10 MG tablet Commonly known as: SINGULAIR Take 1 tablet by  mouth daily.   oxyCODONE-acetaminophen 10-325 MG tablet Commonly known as: PERCOCET Take 1 tablet by mouth every 4 (four) hours.   pantoprazole 40 MG tablet Commonly known as: PROTONIX TAKE 1 TABLET BY MOUTH TWICE(2) DAILY What changed: See the new instructions.   polyethylene glycol 17 g packet Commonly known as: MIRALAX / GLYCOLAX Take 17 g by mouth daily. Start taking on: January 08, 2020   tamsulosin 0.4 MG Caps capsule Commonly known as: FLOMAX Take 0.4 mg by mouth daily.   traMADol 50 MG tablet Commonly known as: ULTRAM Take 1  tablet (50 mg total) by mouth every 6 (six) hours as needed.   traZODone 50 MG tablet Commonly known as: DESYREL Take 100 mg by mouth at bedtime.   VITAMIN B-12 IJ Inject 1,000 mcg as directed every 30 (thirty) days.       Follow-up Information    [name], MD Follow up in 1 week(s).   Specialty: Internal Medicine Contact information: [place] [address] [phone]        Thompson Grayer, MD .   Specialty: Cardiology Contact information: [place] [address] [phone]        Altamese [place], MD Follow up in 2 week(s).   Specialty: Orthopedic Surgery Contact information: [place] [address] [phone]              Allergies  Allergen Reactions   Penicillins Hives and Shortness Of Breath    Did it involve swelling of the face/tongue/throat, SOB, or low BP? Yes Did it involve sudden or severe rash/hives, skin peeling, or any reaction on the inside of your mouth or nose? No Did you need to seek medical attention at a hospital or doctor's office? Yes (emergency room) When did it last happen?1974 If all above answers are "NO", may proceed with cephalosporin use.      Consultations:  Orthopedic surgery   Procedures/Studies: DG Lumbar Spine Complete  Result Date: 12/31/2019 CLINICAL DATA:  Hit by tractor EXAM: LUMBAR SPINE - COMPLETE 4+ VIEW COMPARISON:  02/22/2017 FINDINGS: Mild straightening of the lumbar spine. Posterior rods, fixating screws and interbody device at L5-S1. Small metallic fragment posterior to the lower spinal rods without change. Vertebral body heights are maintained. Aortic atherosclerosis. Mild degenerative changes at L2-L3. IMPRESSION: Postsurgical changes at L5-S1. No acute osseous abnormality. Electronically Signed   By: Donavan Foil M.D.   On: 12/31/2019 20:04   CT HEAD WO CONTRAST  Result Date: 12/31/2019 CLINICAL DATA:  Head trauma EXAM: CT HEAD  WITHOUT CONTRAST TECHNIQUE: Contiguous axial images were obtained from the base of the skull through the vertex without intravenous contrast. COMPARISON:  CT brain 05/20/2017 FINDINGS: Brain: No acute territorial infarction, hemorrhage, or intracranial mass is visualized. There is mild atrophy. Stable ventricle size. Vascular: No hyperdense vessels.  Carotid vascular calcification. Skull: Normal. Negative for fracture or focal lesion. Sinuses/Orbits: No acute finding. Other: None IMPRESSION: 1. No CT evidence for acute intracranial abnormality. 2. Mild atrophy. Electronically Signed   By: Donavan Foil M.D.   On: 12/31/2019 20:18   CT PELVIS WO CONTRAST  Result Date: 01/01/2020 CLINICAL DATA:  Pelvic fracture. EXAM: CT PELVIS WITHOUT CONTRAST TECHNIQUE: Multidetector CT imaging of the pelvis was performed following the standard protocol without intravenous contrast. COMPARISON:  CT dated 09/22/2017. X-rays from the same day. CT dated Jun 15, 2019. FINDINGS: Urinary Tract:  No abnormality visualized. Bowel:  Unremarkable visualized pelvic bowel  loops. Vascular/Lymphatic: There are atherosclerotic changes of the visualized distal abdominal aorta. There are no concerning pathologically enlarged lymph nodes. Reproductive:  No mass or other significant abnormality Other:  None. Musculoskeletal: There is a hematoma overlying the greater trochanter of the left hip that measures approximately 7.6 x 3.5 cm. There is a small volume pelvic hematoma surrounding the urinary bladder. There is an acute, nondisplaced fracture through the right posterosuperior iliac crest (axial series 4, image 12). There is a probable nondisplaced fracture through the right L5 transverse process. There is a probable nondisplaced fracture through the right superior hemi sacrum (axial series 4, image 14), not well evaluated secondary to streak artifact. There are minimally displaced fractures involving the bilateral inferior pubic rami and the  left parasymphyseal pubic bone. The patient is status post prior total hip arthroplasty on the right. The hardware appears grossly intact where visualized. There are degenerative changes of the left hip without evidence for an acute displaced fracture. The patient is status post prior L5-S1 posterior fusion. The hardware is grossly intact. IMPRESSION: 1. There are multiple acute fractures of the pelvic bones as detailed above. Most are minimally displaced or nondisplaced. 2. There is a 7.6 cm hematoma overlying the left hip. No evidence for an acute displaced fracture of the proximal left femur. 3. Small volume pelvic hematoma. Aortic Atherosclerosis (ICD10-I70.0). Electronically Signed   By: Constance Holster M.D.   On: 01/01/2020 00:09   MR LUMBAR SPINE WO CONTRAST  Result Date: 01/05/2020 CLINICAL DATA:  [place] from a tractor.  Pelvic fractures.  Back pain. EXAM: MRI LUMBAR SPINE WITHOUT CONTRAST TECHNIQUE: Multiplanar, multisequence MR imaging of the lumbar spine was performed. No intravenous contrast was administered. COMPARISON:  MRI 04/22/2017 FINDINGS: Segmentation: There are five lumbar type vertebral bodies. The last full intervertebral disc space is labeled L5-S1. This correlates with the prior MRI examination. Alignment:  Normal Vertebrae: Stable lumbar fusion hardware at L5-S1 with pedicle screws, posterior rods and interbody fusion device. No complicating features are identified. No acute lumbar fracture or bone lesion. Conus medullaris and cauda equina: Conus extends to the L1-2 level. Conus and cauda equina appear normal. Paraspinal and other soft tissues: No significant paraspinal or retroperitoneal findings. Disc levels: No significant lumbar disc protrusions, spinal or foraminal stenosis. No change when compared to the prior examination. IMPRESSION: 1. Stable lumbar fusion hardware at L5-S1. No complicating features. 2. No significant lumbar disc protrusions, spinal or foraminal stenosis. 3.  No acute lumbar fractures. Electronically Signed   By: Marijo Sanes M.D.   On: 01/05/2020 19:45   DG Pelvis Comp Min 3V  Result Date: 01/02/2020 CLINICAL DATA:  Pelvic ring fracture, trauma. EXAM: JUDET PELVIS - 3+ VIEW COMPARISON:  CT 12/31/2019 FINDINGS: Previously seen pelvic fractures by CT difficult to visualize by plain film. The inferior pubic rami fractures are visualized. Other described fractures not visualized. Prior right hip replacement.  No hardware complicating feature. IMPRESSION: Previously seen pelvic fractures by CT difficult to visualize, better evaluated by CT. Electronically Signed   By: Rolm Baptise M.D.   On: 01/02/2020 10:30   DG HIP UNILAT W OR W/O PELVIS 2-3 VIEWS LEFT  Result Date: 12/31/2019 CLINICAL DATA:  Hit by tractor EXAM: DG HIP (WITH OR WITHOUT PELVIS) 2-3V LEFT COMPARISON:  12/19/2017, CT 06/15/2019 FINDINGS: Possible acute nondisplaced fracture involving the left inferior pubic ramus. Left femoral head projects in joint. No fracture or dislocation at the left hip. IMPRESSION: Possible acute nondisplaced fracture involving the left inferior  pubic ramus. Electronically Signed   By: Donavan Foil M.D.   On: 12/31/2019 20:07   DG HIP UNILAT W OR W/O PELVIS 2-3 VIEWS RIGHT  Result Date: 12/31/2019 CLINICAL DATA:  Hit by tractor EXAM: DG HIP (WITH OR WITHOUT PELVIS) 2-3V RIGHT COMPARISON:  05/20/2017 FINDINGS: Surgical hardware in the lumbosacral spine. Pubic symphysis appears intact. Probable acute nondisplaced fracture involving the left inferior pubic ramus. Status post right hip arthroplasty with intact hardware and normal alignment. IMPRESSION: Probable acute nondisplaced fracture involving the left inferior pubic ramus. Right hip replacement without acute osseous abnormality. Electronically Signed   By: Donavan Foil M.D.   On: 12/31/2019 20:09   [place] EKG SITE RITE  Result Date: 01/06/2020 If Site Rite image not attached, placement could not be confirmed due to  current cardiac rhythm.      Subjective: Patient seen and examined.  Tells me that he feels much better this morning.  No new complaints.  No acute events overnight.  Wishes to go to nursing home today.  Discharge Exam: Vitals:   01/07/20 0421 01/07/20 0754  BP: 103/68 97/65  Pulse: 67 80  Resp: 14 20  Temp:  98.1 F (36.7 C)  SpO2: 92% 91%   Vitals:   01/06/20 1950 01/07/20 0025 01/07/20 0421 01/07/20 0754  BP: 121/85 96/64 103/68 97/65  Pulse: 71 69 67 80  Resp: 15 17 14 20   Temp: 98.6 F (37 C) 98.5 F (36.9 C)  98.1 F (36.7 C)  TempSrc: Oral Oral    SpO2: 93% 91% 92% 91%  Weight:      Height:        General: Pt is alert, awake, not in acute distress, elderly, on 2 L of oxygen via nasal cannula, communicating well Cardiovascular: RRR, S1/S2 +, no rubs, no gallops Respiratory: CTA bilaterally, no wheezing, no rhonchi Abdominal: Soft, NT, ND, bowel sounds + Extremities: no edema, no cyanosis    The results of significant diagnostics from this hospitalization (including imaging, microbiology, ancillary and laboratory) are listed below for reference.     Microbiology: Recent Results (from the past 240 hour(s))  Resp Panel by RT-PCR (Flu A&B, Covid) Nasopharyngeal Swab     Status: None   Collection Time: 12/31/19  9:25 PM   Specimen: Nasopharyngeal Swab; Nasopharyngeal(NP) swabs in vial transport medium  Result Value Ref Range Status   SARS Coronavirus 2 by RT PCR NEGATIVE NEGATIVE Final    Comment: (NOTE) SARS-CoV-2 target nucleic acids are NOT DETECTED.  The SARS-CoV-2 RNA is generally detectable in upper respiratory specimens during the acute phase of infection. The lowest concentration of SARS-CoV-2 viral copies this assay can detect is 138 copies/mL. A negative result does not preclude SARS-Cov-2 infection and should not be used as the sole basis for treatment or other patient management decisions. A negative result may occur with  improper specimen  collection/handling, submission of specimen other than nasopharyngeal swab, presence of viral mutation(s) within the areas targeted by this assay, and inadequate number of viral copies(<138 copies/mL). A negative result must be combined with clinical observations, patient history, and epidemiological information. The expected result is Negative.  Fact Sheet for Patients:  EntrepreneurPulse.com.au  Fact Sheet for Healthcare Providers:  IncredibleEmployment.be  This test is no t yet approved or cleared by the [place] FDA and  has been authorized for detection and/or diagnosis of SARS-CoV-2 by FDA under an Emergency Use Authorization (EUA). This EUA will remain  in effect (meaning this test can be  used) for the duration of the COVID-19 declaration under Section 564(b)(1) of the Act, 21 U.S.C.section 360bbb-3(b)(1), unless the authorization is terminated  or revoked sooner.       Influenza A by PCR NEGATIVE NEGATIVE Final   Influenza B by PCR NEGATIVE NEGATIVE Final    Comment: (NOTE) The Xpert Xpress SARS-CoV-2/FLU/RSV plus assay is intended as an aid in the diagnosis of influenza from Nasopharyngeal swab specimens and should not be used as a sole basis for treatment. Nasal washings and aspirates are unacceptable for Xpert Xpress SARS-CoV-2/FLU/RSV testing.  Fact Sheet for Patients: EntrepreneurPulse.com.au  Fact Sheet for Healthcare Providers: IncredibleEmployment.be  This test is not yet approved or cleared by the [place] FDA and has been authorized for detection and/or diagnosis of SARS-CoV-2 by FDA under an Emergency Use Authorization (EUA). This EUA will remain in effect (meaning this test can be used) for the duration of the COVID-19 declaration under Section 564(b)(1) of the Act, 21 U.S.C. section 360bbb-3(b)(1), unless the authorization is terminated or revoked.  Performed at [facility], [place] [address], [place], [place] [address]   MRSA PCR Screening     Status: None   Collection Time: 01/01/20  4:09 AM   Specimen: Nasal Mucosa; Nasopharyngeal  Result Value Ref Range Status   MRSA by PCR NEGATIVE NEGATIVE Final    Comment:        The GeneXpert MRSA Assay (FDA approved for NASAL specimens only), is one component of a comprehensive MRSA colonization surveillance program. It is not intended to diagnose MRSA infection nor to guide or monitor treatment for MRSA infections. Performed at [facility], [place] [address] [place], [place] [address]   Culture, Urine     Status: Abnormal   Collection Time: 01/03/20 12:52 PM   Specimen: Urine, Random  Result Value Ref Range Status   Specimen Description URINE, RANDOM  Final   Special Requests NONE  Final   Culture (A)  Final    >=100,000 COLONIES/mL KLEBSIELLA PNEUMONIAE Confirmed Extended Spectrum Beta-Lactamase Producer (ESBL).  In bloodstream infections from ESBL organisms, carbapenems are preferred over piperacillin/tazobactam. They are shown to have a lower risk of mortality. MULTI-DRUG RESISTANT ORGANISM RESULT CALLED TO, READ BACK BY AND VERIFIED WITH: RN Catheryn Bacon [id number] [phone] MLM Performed at [facility], [address], [place], [place] [address]    Report Status 01/05/2020 FINAL  Final   Organism ID, Bacteria KLEBSIELLA PNEUMONIAE (A)  Final      Susceptibility   Klebsiella pneumoniae - MIC*    AMPICILLIN >=32 RESISTANT Resistant     CEFAZOLIN >=64 RESISTANT Resistant     CEFEPIME >=32 RESISTANT Resistant     CEFTRIAXONE >=64 RESISTANT Resistant     CIPROFLOXACIN 2 INTERMEDIATE Intermediate     GENTAMICIN >=16 RESISTANT Resistant     IMIPENEM <=0.25 SENSITIVE Sensitive     NITROFURANTOIN 128 RESISTANT Resistant     TRIMETH/SULFA >=320 RESISTANT Resistant     AMPICILLIN/SULBACTAM >=32 RESISTANT Resistant     PIP/TAZO 32 INTERMEDIATE Intermediate     * >=100,000 COLONIES/mL  KLEBSIELLA PNEUMONIAE  SARS Coronavirus 2 by RT PCR (hospital order, performed in [place] hospital lab) Nasopharyngeal Nasopharyngeal Swab     Status: None   Collection Time: 01/05/20  5:00 AM   Specimen: Nasopharyngeal Swab  Result Value Ref Range Status   SARS Coronavirus 2 NEGATIVE NEGATIVE Final    Comment: (NOTE) SARS-CoV-2 target nucleic acids are NOT DETECTED.  The SARS-CoV-2  RNA is generally detectable in upper and lower respiratory specimens during the acute phase of infection. The lowest concentration of SARS-CoV-2 viral copies this assay can detect is 250 copies / mL. A negative result does not preclude SARS-CoV-2 infection and should not be used as the sole basis for treatment or other patient management decisions.  A negative result may occur with improper specimen collection / handling, submission of specimen other than nasopharyngeal swab, presence of viral mutation(s) within the areas targeted by this assay, and inadequate number of viral copies (<250 copies / mL). A negative result must be combined with clinical observations, patient history, and epidemiological information.  Fact Sheet for Patients:   StrictlyIdeas.no  Fact Sheet for Healthcare Providers: BankingDealers.co.za  This test is not yet approved or  cleared by the [place] FDA and has been authorized for detection and/or diagnosis of SARS-CoV-2 by FDA under an Emergency Use Authorization (EUA).  This EUA will remain in effect (meaning this test can be used) for the duration of the COVID-19 declaration under Section 564(b)(1) of the Act, 21 U.S.C. section 360bbb-3(b)(1), unless the authorization is terminated or revoked sooner.  Performed at [facility], [place] [address] [place], [place] [address]      Labs: BNP (last 3 results) No results for input(s): BNP in the last 8760 hours. Basic Metabolic Panel: Recent Labs  Lab 01/01/20 0455  01/02/20 0420 01/03/20 0122 01/04/20 0144 01/05/20 0029 01/07/20 0144  NA 135 136 135  --  133* 133*  K 4.0 4.2 4.4  --  4.4 4.9  CL 103 103 103  --  97* 96*  CO2 23 26 23   --  29 28  GLUCOSE 118* 153* 216*  --  267* 217*  BUN 12 10 10   --  11 18  CREATININE 1.32* 1.21 1.24  --  1.29* 1.29*  CALCIUM 8.0* 8.3* 8.3*  --  8.5* 8.9  MG  --  1.3* 1.4* 1.6* 1.7 1.6*   Liver Function Tests: Recent Labs  Lab 01/01/20 0455  AST 20  ALT 14  ALKPHOS 28*  BILITOT 0.8  PROT 4.9*  ALBUMIN 2.8*   No results for input(s): LIPASE, AMYLASE in the last 168 hours. No results for input(s): AMMONIA in the last 168 hours. CBC: Recent Labs  Lab 01/02/20 0420 01/03/20 0122 01/04/20 0144 01/05/20 0029 01/06/20 0124 01/07/20 0144  WBC 11.5* 13.7* 12.8* 11.2* 12.4* 10.7*  NEUTROABS 7.8*  --   --   --   --   --   HGB 11.7* 10.5* 10.5* 10.5* 11.1* 11.5*  HCT 35.8* 31.9* 33.1* 33.1* 34.5* 34.8*  MCV 92.5 91.7 91.2 91.2 90.1 90.4  PLT 256 218 268 319 348 382   Cardiac Enzymes: No results for input(s): CKTOTAL, CKMB, CKMBINDEX, TROPONINI in the last 168 hours. BNP: Invalid input(s): POCBNP CBG: Recent Labs  Lab 01/06/20 1105 01/06/20 1530 01/06/20 2004 01/07/20 0753 01/07/20 1115  GLUCAP 255* 238* 221* 229* 272*   D-Dimer No results for input(s): DDIMER in the last 72 hours. Hgb A1c No results for input(s): HGBA1C in the last 72 hours. Lipid Profile No results for input(s): CHOL, HDL, LDLCALC, TRIG, CHOLHDL, LDLDIRECT in the last 72 hours. Thyroid function studies No results for input(s): TSH, T4TOTAL, T3FREE, THYROIDAB in the last 72 hours.  Invalid input(s): FREET3 Anemia work up No results for input(s): VITAMINB12, FOLATE, FERRITIN, TIBC, IRON, RETICCTPCT in the last 72 hours. Urinalysis    Component Value Date/Time   COLORURINE  YELLOW 01/02/2020 [place] 01/02/2020 1118   LABSPEC 1.020 01/02/2020 1118   PHURINE 5.0 01/02/2020 1118   GLUCOSEU >=500 (A)  01/02/2020 1118   HGBUR SMALL (A) 01/02/2020 1118   BILIRUBINUR NEGATIVE 01/02/2020 1118   KETONESUR NEGATIVE 01/02/2020 1118   PROTEINUR NEGATIVE 01/02/2020 1118   NITRITE NEGATIVE 01/02/2020 1118   LEUKOCYTESUR SMALL (A) 01/02/2020 1118   Sepsis Labs Invalid input(s): PROCALCITONIN,  WBC,  LACTICIDVEN Microbiology Recent Results (from the past 240 hour(s))  Resp Panel by RT-PCR (Flu A&B, Covid) Nasopharyngeal Swab     Status: None   Collection Time: 12/31/19  9:25 PM   Specimen: Nasopharyngeal Swab; Nasopharyngeal(NP) swabs in vial transport medium  Result Value Ref Range Status   SARS Coronavirus 2 by RT PCR NEGATIVE NEGATIVE Final    Comment: (NOTE) SARS-CoV-2 target nucleic acids are NOT DETECTED.  The SARS-CoV-2 RNA is generally detectable in upper respiratory specimens during the acute phase of infection. The lowest concentration of SARS-CoV-2 viral copies this assay can detect is 138 copies/mL. A negative result does not preclude SARS-Cov-2 infection and should not be used as the sole basis for treatment or other patient management decisions. A negative result may occur with  improper specimen collection/handling, submission of specimen other than nasopharyngeal swab, presence of viral mutation(s) within the areas targeted by this assay, and inadequate number of viral copies(<138 copies/mL). A negative result must be combined with clinical observations, patient history, and epidemiological information. The expected result is Negative.  Fact Sheet for Patients:  EntrepreneurPulse.com.au  Fact Sheet for Healthcare Providers:  IncredibleEmployment.be  This test is no t yet approved or cleared by the [place] FDA and  has been authorized for detection and/or diagnosis of SARS-CoV-2 by FDA under an Emergency Use Authorization (EUA). This EUA will remain  in effect (meaning this test can be used) for the duration of the COVID-19  declaration under Section 564(b)(1) of the Act, 21 U.S.C.section 360bbb-3(b)(1), unless the authorization is terminated  or revoked sooner.       Influenza A by PCR NEGATIVE NEGATIVE Final   Influenza B by PCR NEGATIVE NEGATIVE Final    Comment: (NOTE) The Xpert Xpress SARS-CoV-2/FLU/RSV plus assay is intended as an aid in the diagnosis of influenza from Nasopharyngeal swab specimens and should not be used as a sole basis for treatment. Nasal washings and aspirates are unacceptable for Xpert Xpress SARS-CoV-2/FLU/RSV testing.  Fact Sheet for Patients: EntrepreneurPulse.com.au  Fact Sheet for Healthcare Providers: IncredibleEmployment.be  This test is not yet approved or cleared by the [place] FDA and has been authorized for detection and/or diagnosis of SARS-CoV-2 by FDA under an Emergency Use Authorization (EUA). This EUA will remain in effect (meaning this test can be used) for the duration of the COVID-19 declaration under Section 564(b)(1) of the Act, 21 U.S.C. section 360bbb-3(b)(1), unless the authorization is terminated or revoked.  Performed at [facility], [place] [address], [place], [place] [address]   MRSA PCR Screening     Status: None   Collection Time: 01/01/20  4:09 AM   Specimen: Nasal Mucosa; Nasopharyngeal  Result Value Ref Range Status   MRSA by PCR NEGATIVE NEGATIVE Final    Comment:        The GeneXpert MRSA Assay (FDA approved for NASAL specimens only), is one component of a comprehensive MRSA colonization surveillance program. It is not intended to diagnose MRSA infection nor to guide or monitor treatment for MRSA infections. Performed at  [facility], [place] [address], [place], [place] [address]   Culture, Urine     Status: Abnormal   Collection Time: 01/03/20 12:52 PM   Specimen: Urine, Random  Result Value Ref Range Status   Specimen Description URINE, RANDOM  Final   Special Requests NONE   Final   Culture (A)  Final    >=100,000 COLONIES/mL KLEBSIELLA PNEUMONIAE Confirmed Extended Spectrum Beta-Lactamase Producer (ESBL).  In bloodstream infections from ESBL organisms, carbapenems are preferred over piperacillin/tazobactam. They are shown to have a lower risk of mortality. MULTI-DRUG RESISTANT ORGANISM RESULT CALLED TO, READ BACK BY AND VERIFIED WITH: RN Catheryn Bacon [id number] [phone] MLM Performed at [facility], [address], [place], [place] [address]    Report Status 01/05/2020 FINAL  Final   Organism ID, Bacteria KLEBSIELLA PNEUMONIAE (A)  Final      Susceptibility   Klebsiella pneumoniae - MIC*    AMPICILLIN >=32 RESISTANT Resistant     CEFAZOLIN >=64 RESISTANT Resistant     CEFEPIME >=32 RESISTANT Resistant     CEFTRIAXONE >=64 RESISTANT Resistant     CIPROFLOXACIN 2 INTERMEDIATE Intermediate     GENTAMICIN >=16 RESISTANT Resistant     IMIPENEM <=0.25 SENSITIVE Sensitive     NITROFURANTOIN 128 RESISTANT Resistant     TRIMETH/SULFA >=320 RESISTANT Resistant     AMPICILLIN/SULBACTAM >=32 RESISTANT Resistant     PIP/TAZO 32 INTERMEDIATE Intermediate     * >=100,000 COLONIES/mL KLEBSIELLA PNEUMONIAE  SARS Coronavirus 2 by RT PCR (hospital order, performed in [place] hospital lab) Nasopharyngeal Nasopharyngeal Swab     Status: None   Collection Time: 01/05/20  5:00 AM   Specimen: Nasopharyngeal Swab  Result Value Ref Range Status   SARS Coronavirus 2 NEGATIVE NEGATIVE Final    Comment: (NOTE) SARS-CoV-2 target nucleic acids are NOT DETECTED.  The SARS-CoV-2 RNA is generally detectable in upper and lower respiratory specimens during the acute phase of infection. The lowest concentration of SARS-CoV-2 viral copies this assay can detect is 250 copies / mL. A negative result does not preclude SARS-CoV-2 infection and should not be used as the sole basis for treatment or other patient management decisions.  A negative result may occur with improper specimen  collection / handling, submission of specimen other than nasopharyngeal swab, presence of viral mutation(s) within the areas targeted by this assay, and inadequate number of viral copies (<250 copies / mL). A negative result must be combined with clinical observations, patient history, and epidemiological information.  Fact Sheet for Patients:   StrictlyIdeas.no  Fact Sheet for Healthcare Providers: BankingDealers.co.za  This test is not yet approved or  cleared by the [place] FDA and has been authorized for detection and/or diagnosis of SARS-CoV-2 by FDA under an Emergency Use Authorization (EUA).  This EUA will remain in effect (meaning this test can be used) for the duration of the COVID-19 declaration under Section 564(b)(1) of the Act, 21 U.S.C. section 360bbb-3(b)(1), unless the authorization is terminated or revoked sooner.  Performed at [facility], [place] [address] [place], [place] [address]      Time coordinating discharge: Over 30 minutes  SIGNED:   Mckinley Jewel, MD  [facility] 01/07/2020, 11:48 AM Pager   If 7PM-7AM, please contact night-coverage [email or web address]

## 2020-01-07 NOTE — Progress Notes (Signed)
Physical Therapy Treatment Patient Details Name: Steve Anthony MRN: [id number] DOB: May 17, 1950 Today's Date: 01/07/2020    History of Present Illness The pt is a 69 yo male presenting with multiple pelvic fx after being knocked down by his tractor. Non-op management planned at this point, pending pt mobility progression. Upon work-up pt also found to have UTI, AKI, but all imaging of head/neck were clear. PMH includes: DM II, SVT, a fib, CAD, COPD, DJD, dyslipidemia, HTN, and OSA but not compliant with CPAP. -    PT Comments    The pt was able to continue making good progress with mobility and activity tolerance at this time. He was able to complete 2 short bouts of walking in the room with use of minA/minG for safety and use of 2L O2 to maintain SpO2 > 88%. The pt was able to walk forwards and backwards with use of RW, and reports he is limited more by pain and SOB than by fatigue at this time. The pt will continue to benefit from skilled PT to progress functional strength, endurance, stability, and energy conservation.     Follow Up Recommendations  SNF;Supervision/Assistance - 24 hour     Equipment Recommendations   (pt well equipped)    Recommendations for Other Services       Precautions / Restrictions Precautions Precautions: Fall Precaution Comments: multiple pelvic fx, watch SpO2 Restrictions Weight Bearing Restrictions: Yes RLE Weight Bearing: Partial weight bearing RLE Partial Weight Bearing Percentage or Pounds: 50% LLE Weight Bearing: Weight bearing as tolerated    Mobility  Bed Mobility Overal bed mobility: Needs Assistance             General bed mobility comments: pt OOB in recliner at start and end of session  Transfers Overall transfer level: Needs assistance Equipment used: Rolling walker (2 wheeled) Transfers: Sit to/from Stand Sit to Stand: Min assist         General transfer comment: VC for hand placement, able to power up with minA and use  of RW. cues for controlled eccentric lower  Ambulation/Gait Ambulation/Gait assistance: Min assist Gait Distance (Feet): 5 Feet (+5 forwards and 5 backwards) Assistive device: Rolling walker (2 wheeled) Gait Pattern/deviations: Step-to pattern;Decreased stride length Gait velocity: decreased Gait velocity interpretation: <1.31 ft/sec, indicative of household ambulator General Gait Details: pt able to step forward with chair follow initially, limited by pain and desat without O2. Pt then able to complete again after seated rest 5 steps  forward and back with use of RW and O2       Balance Overall balance assessment: Needs assistance Sitting-balance support: Feet supported;Single extremity supported Sitting balance-Leahy Scale: Fair Sitting balance - Comments: requires UE support due to pain   Standing balance support: Bilateral upper extremity supported;During functional activity Standing balance-Leahy Scale: Poor Standing balance comment: requires UE support                            Cognition Arousal/Alertness: Awake/alert Behavior During Therapy: WFL for tasks assessed/performed Overall Cognitive Status: Within Functional Limits for tasks assessed                                 General Comments: agreeable to all education and aware of lines/self management without cues      Exercises      General Comments General comments (skin integrity, edema, etc.): SpO2 to  85% on RA, able to maintain 88-90% with activity on 2L O2      Pertinent Vitals/Pain Pain Assessment: Faces Faces Pain Scale: Hurts even more Pain Location: L hip, back, groin Pain Descriptors / Indicators: Dull;Grimacing Pain Intervention(s): Limited activity within patient's tolerance;Monitored during session;Repositioned           PT Goals (current goals can now be found in the care plan section) Acute Rehab PT Goals Patient Stated Goal: to be able to take care of self PT  Goal Formulation: With patient Time For Goal Achievement: 01/17/20 Potential to Achieve Goals: Good Progress towards PT goals: Progressing toward goals    Frequency    Min 4X/week      PT Plan Current plan remains appropriate       AM-PAC PT "6 Clicks" Mobility   Outcome Measure  Help needed turning from your back to your side while in a flat bed without using bedrails?: A Little Help needed moving from lying on your back to sitting on the side of a flat bed without using bedrails?: A Lot Help needed moving to and from a bed to a chair (including a wheelchair)?: A Little Help needed standing up from a chair using your arms (e.g., wheelchair or bedside chair)?: A Little Help needed to walk in hospital room?: A Little Help needed climbing 3-5 steps with a railing? : A Lot 6 Click Score: 16    End of Session Equipment Utilized During Treatment: Gait belt;Oxygen Activity Tolerance: Patient limited by pain Patient left: with call bell/phone within reach;in chair Nurse Communication: Mobility status;Patient requests pain meds PT Visit Diagnosis: Other abnormalities of gait and mobility (R26.89);Pain Pain - Right/Left: Left Pain - part of body: Hip     Time: 1431-1446 PT Time Calculation (min) (ACUTE ONLY): 15 min  Charges:  $Gait Training: 8-22 mins                     Karma Ganja, PT, DPT   Acute Rehabilitation Department Pager #: [phone]   Otho Bellows 01/07/2020, 3:45 PM

## 2020-01-07 NOTE — Progress Notes (Signed)
Pt. Report call to [place] at [facility].

## 2020-01-07 NOTE — Progress Notes (Signed)
Pt. Up the [facility] with little assistance. Pt. Washed up and is setting up in the bedside chair. Call light is in reach will continue to monitor.

## 2020-04-20 IMAGING — CT CT ABD-PEL WO/W CM
2 of 9 series · 12 of 46 positions shown, 18 images · IV contrast (omnipaque)
Comparison: 05/10/2018.

CLINICAL DATA: Evaluate pancreatic pseudocyst.

EXAM:
CT ABDOMEN AND PELVIS WITHOUT AND WITH CONTRAST
TECHNIQUE: Multidetector CT imaging of the abdomen and pelvis was performed
following the standard protocol before and following the bolus
administration of intravenous contrast.
CONTRAST:  100mL OMNIPAQUE IOHEXOL 350 MG/ML SOLN

[Series 4: coronal arterial · coronal · arterial · 0.84mm/px · 3 of 118 slices shown]
[im 30/118  soft-tissue]
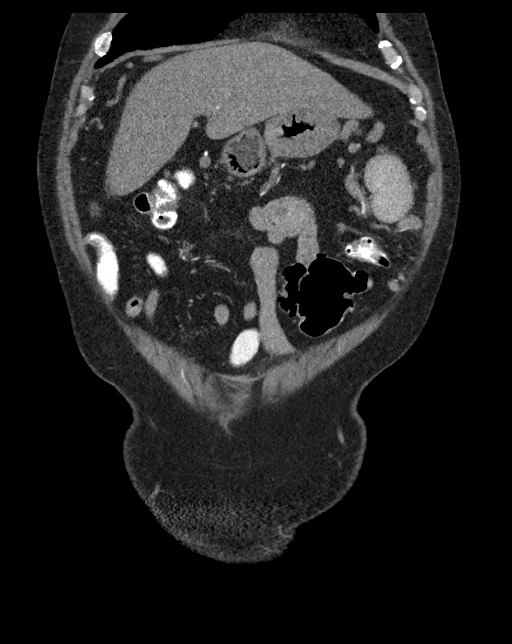
[im 59/118  soft-tissue]
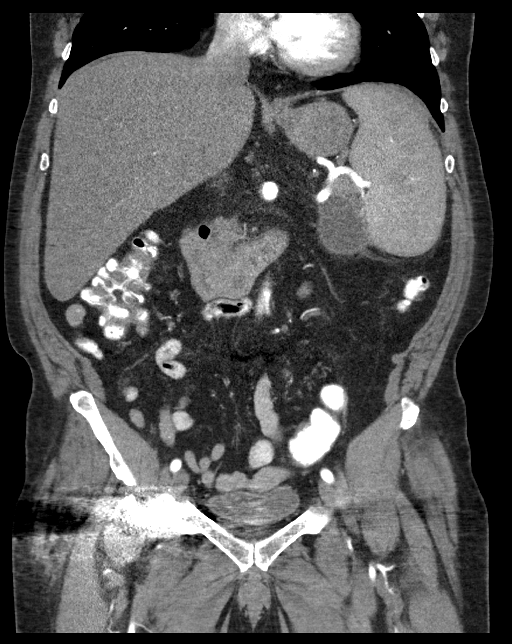
[im 88/118  soft-tissue]
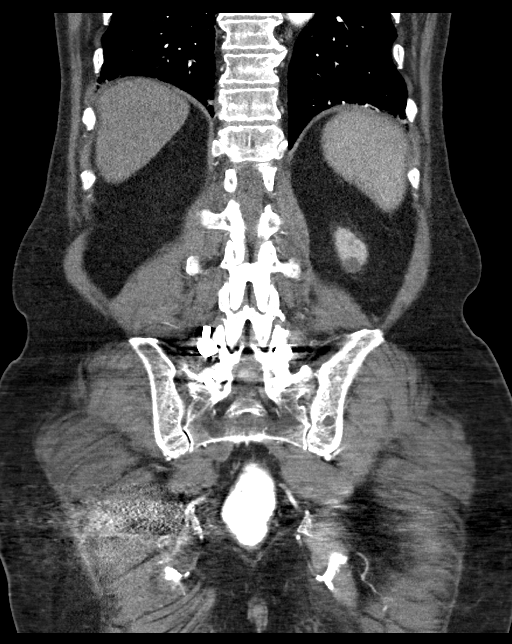

[Series 7: axial venous · axial · portal-venous · 0.97mm/px · z∈[-263,+160]mm · 9 of 173 slices shown, 15 images]
[im 16/173  soft-tissue]
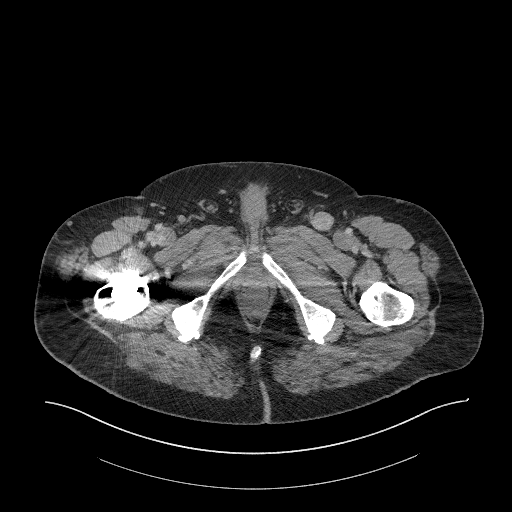
[im 16/173  bone]
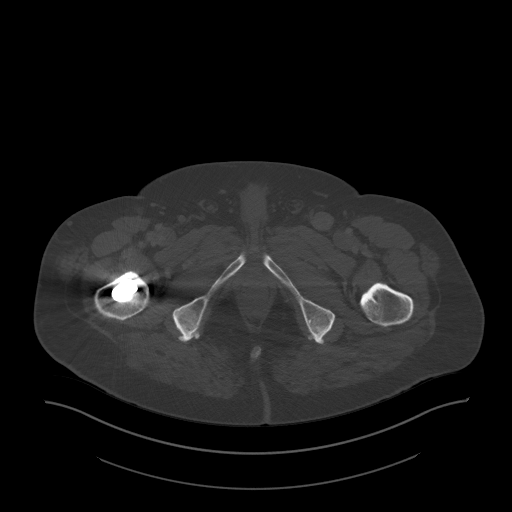
[im 32/173  soft-tissue]
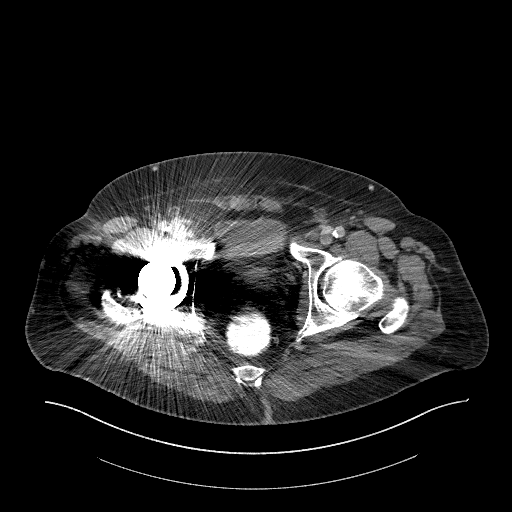
[im 47/173  soft-tissue]
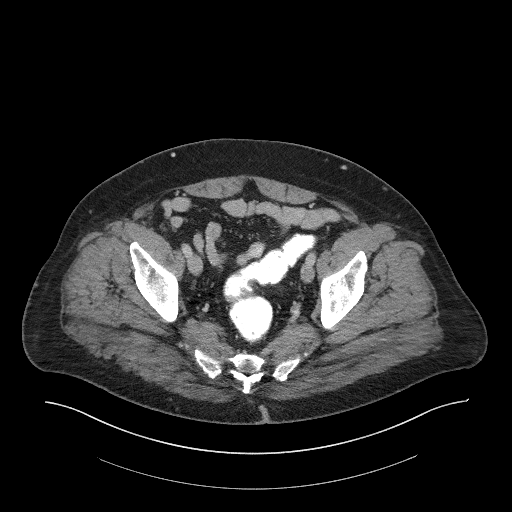
[im 63/173  soft-tissue]
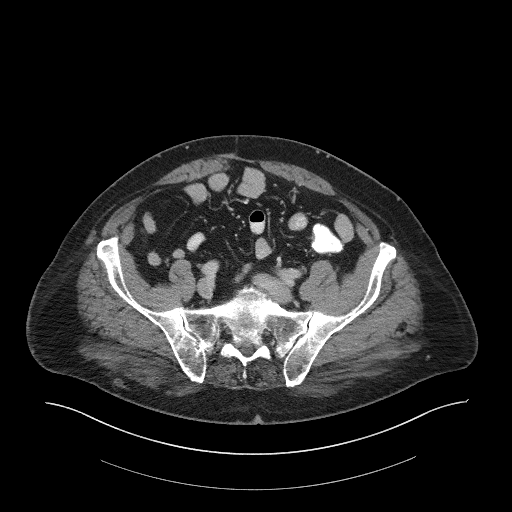
[im 94/173  soft-tissue]
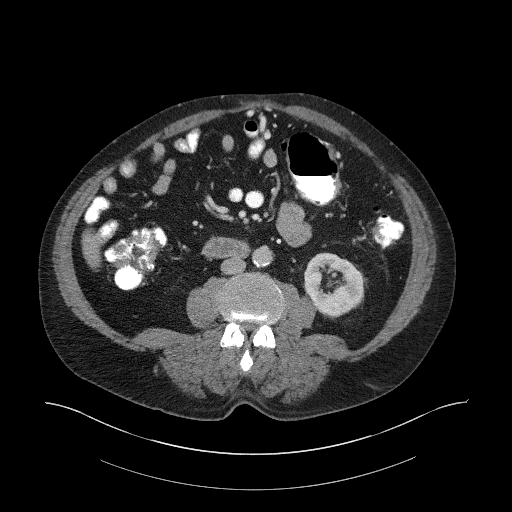
[im 110/173  soft-tissue]
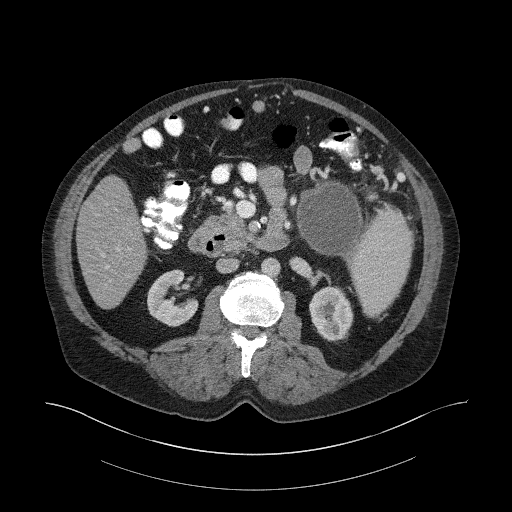
[im 110/173  lung]
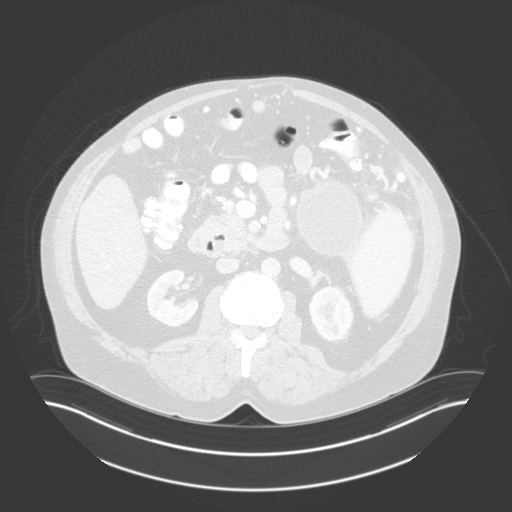
[im 126/173  soft-tissue]
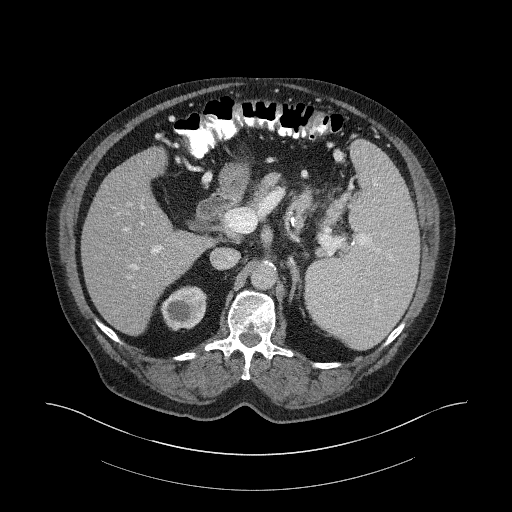
[im 126/173  lung]
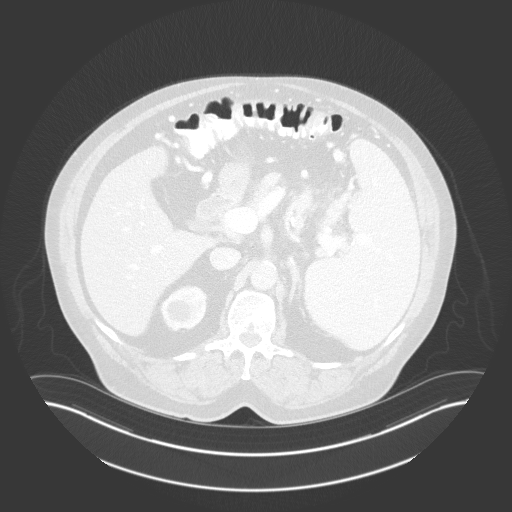
[im 141/173  soft-tissue]
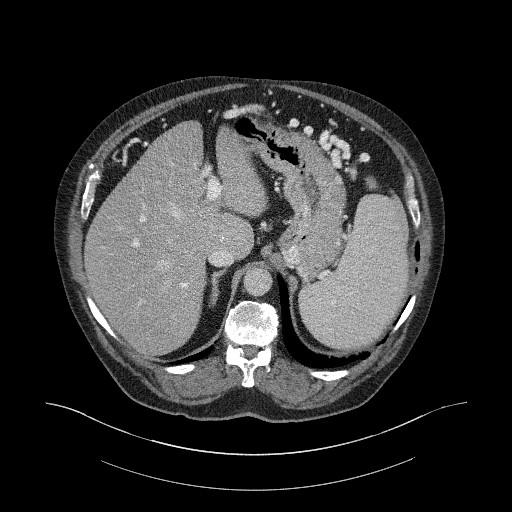
[im 141/173  lung]
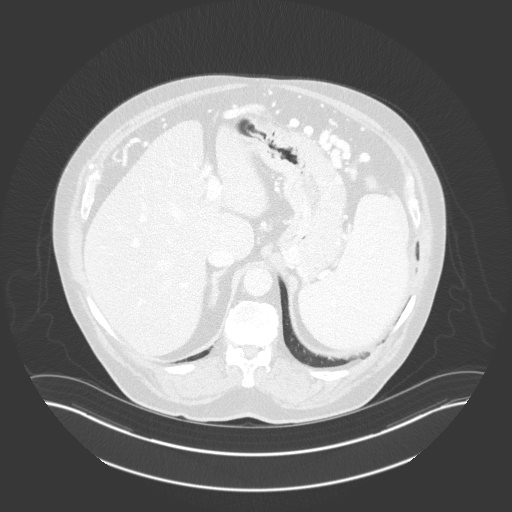
[im 157/173  soft-tissue]
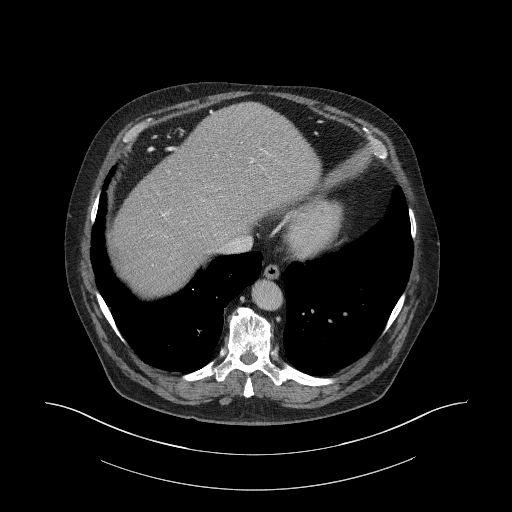
[im 157/173  lung]
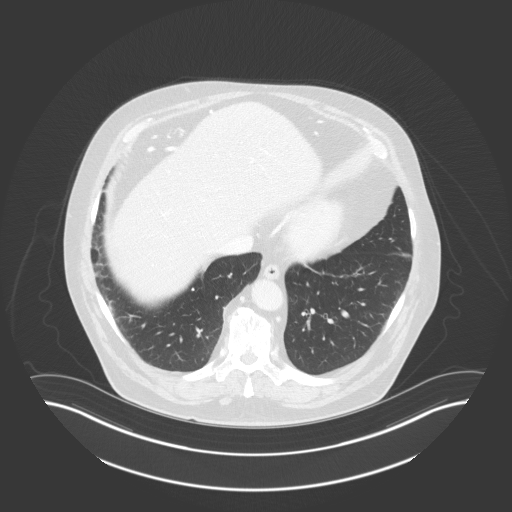
[im 157/173  bone]
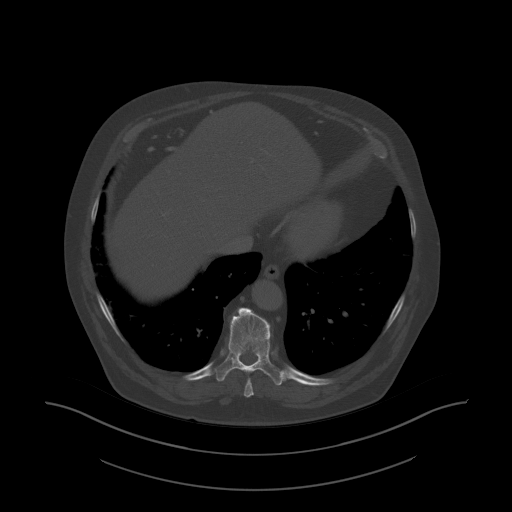

[12 of 46 positions shown; findings below may reference images not displayed]

FINDINGS: Lower chest: No acute abnormality.

Hepatobiliary: Mild hepatic steatosis. There is hypertrophy of the
lateral segment of left lobe of liver and caudate lobe of liver. No
focal liver abnormality identified. Previous cholecystectomy. No
significant biliary ductal dilatation.

Pancreas: No main duct dilatation. Well-circumscribed cystic mass
surrounding the tail of pancreas and extending into the splenic
hilum is again noted. This measures 7.5 x 6.5 by 5.9 cm, image 62/7.
on the previous examination this measured 7.3 x 6.4 by 6.0 cm. There
is a small amount of surrounding fat stranding.

Spleen: Splenomegaly is again noted, unchanged.

Adrenals/Urinary Tract: Normal appearance of the adrenal glands.
Bilateral kidney cysts are again noted. No mass or hydronephrosis
identified.

Stomach/Bowel: Stomach is within normal limits. Appendix appears
normal. No evidence of bowel wall thickening, distention, or
inflammatory changes. Colonic diverticulosis identified without
acute inflammation.

Vascular/Lymphatic: Aortic atherosclerosis. No aneurysm. Splenic
vein occlusion is again identified with progressive upper abdominal
collateralization including new gastric and progressive splenic
varices. The portal vein remains patent. No abdominopelvic
adenopathy.

Reproductive: Prostate is unremarkable.

Other: No significant free fluid or fluid collections.

Musculoskeletal: Lumbar degenerative disc disease. Status post
posterior hardware fixation and interbody fusion of L5-S1.
IMPRESSION: 1. No significant change in peripherally enhancing,
well-circumscribed fluid density structure involving the pancreatic
tail compatible with pseudocyst.
2. Hepatic steatosis and morphologic features of liver suggestive of
cirrhosis.
3. Splenomegaly
4. Progressive upper abdominal portosystemic collaterals.
5.  Aortic Atherosclerosis ([id number]).

## 2020-05-11 NOTE — Progress Notes (Addendum)
Cardiology Office Note Date:  05/12/2020  Patient ID:  Steve, Anthony 10-31-1950, MRN [id number] PCP:  [name], MD  Electrophysiologist:  Dr. Rayann Heman   Chief Complaint:  planned f/u   History of Present Illness: Steve Anthony is a 70 y.o. male with history of CAD (PCI 2006 LAD, notes report cath Oct 2014 revealed stable CAD with patent stent to mid LAD), COPD, HTN, HLD, hyperthyroidism, OSA (not on CPAP), AFIB, flutter (s/p CTI/PVI ablation 02/2009 and 09/2009), DM, SVT (AVNRT slow pathway ablated 2007), recurrent pancreatitis and pancreatic pseudocysts >>> s/p distal pancreatectomy, splenectomy, debridement of pancreatic necrosis 06/07/2019 (peri-op orthostatic hypotension required midodrine for a while) .  I saw him 08/01/19 He is doing well.  He denies any palpitations, no CP, SOB.  He denies dizzy spells, no near syncope or syncope, no symptoms of orthostatic dizziness. No bleeding or signs of bleeding He reports BPs generally 120's, some have been as high as 150's, though infrequently, he denies any BP readings <100 Negative orthostatics in the office, recommended to reduce his midodrine and continue f/u with his PMD for management, of this ans his diuresis given 2/2 ascites/portal HTN  Phone notes with subsequent reports of being weaned off midodrine  He saw Dr. Rayann Heman in Nov 2021, doing well, no changes were made.  Planned for 6 mo follow up  Has been following with endo with difficult DM management.  He had a hospital stay Dec 2021 after an accident with his tractir.  He was working on it and appraently did not have the tired blocked and accidentally hit the lever and it went forward and ran him over.  Unable to get up layed there for hours until a neighbor kid came home from school. He has done well with his recivery.  He has some DOE though reports this not new, and only when he has to walk a long way or is doing heavier work.  Dates this back "oh quite a long time"  prehaps years. No DOE with his usual ADLs He feels like he has had a couple episodes of AFib in the last couple pmonths they are brief 3-4 minutes. Once about 66mnutes and made him feel weak. He feels his heart take off and feels a little discomfort and weak. A week or so ago was driving and felt it start and had to pull off the road for his wife to drive. He denies CP or discomfort any othertime, ionly when his HR is fast/in Afib.  No dizziness otherwise either.  His med list is much different then the med list he brings Amiodarone is listed, appears to have been started in the hospital but he denies ever having it at home. Says the meds with him today are the only current medicines except a "water pill" that he rarely uses  Denies bleeding or signs of bleeding  Past Medical History:  Diagnosis Date  . Anemia   . Arthritis   . Asthma   . CAD (coronary artery disease)    a. S/P PCI mid LAD (vision stent) 08/31/2004;  b. repeat cath 6/11 - no progression of CAD. c. Cath 10/2012 given moderate CAD on cardiac CT - no change from prior cath.  . Chest pain 12/16/2016  . [place]   . COVID-19 virus infection   . Diverticulitis   . DJD (degenerative joint disease) of lumbar spine   . Dyslipidemia   . Gallstones   .  GERD (gastroesophageal reflux disease)   . History of kidney stones   . Hypertension   . Hyperthyroidism   . Melanoma ([place])    melanoma removed from neck   . On home oxygen therapy    "2L q hs" (11/08/2012),  06/04/19 pt. not on oxygen  . OSA (obstructive sleep apnea)    a. cpap noncompliance- patient reports on 12/10/14- does not use CPAP  . Palpitations    a. 10/2012 - s/p LINQ loop recorder to assess for arrhythmia.   . Pancreatitis   . Paroxysmal A-fib ([place])    a. S/P PVI 2/11 and 9/11  . PICC (peripherally inserted central catheter) in place   . Pneumonia   . SVT (supraventricular tachycardia) ([place])    a. Atypical AVNRT  of the slow pathway  . Type II diabetes mellitus ([place])     Past Surgical History:  Procedure Laterality Date  . ATRIAL ABLATION SURGERY  2007   S/P slow pathway ablation for atypical AVNRT   . BIOPSY  07/17/2018   Procedure: BIOPSY;  Surgeon: Rush Landmark Telford Nab., MD;  Location: [place];  Service: Gastroenterology;;  . [facility]  ~ 2008  . COLON RESECTION  02/2013   . COLONOSCOPY  12/25/2007   Colonic polyp status post polypectomy. Internal hemorrhoids.   . CORONARY ANGIOPLASTY WITH STENT PLACEMENT  08/30/2004   Vision; to LAD   . CYSTOSCOPY W/ STONE MANIPULATION  ~ 1998   "once" (11/08/2012)  . EP Study  2008   Negative EP study in [place]  . ESOPHAGOGASTRODUODENOSCOPY (EGD) WITH PROPOFOL N/A 07/17/2018   Procedure: ESOPHAGOGASTRODUODENOSCOPY (EGD) WITH PROPOFOL;  Surgeon: Rush Landmark Telford Nab., MD;  Location: [facility];  Service: Gastroenterology;  Laterality: N/A;  . FRACTURE SURGERY    . LEFT HEART CATHETERIZATION WITH CORONARY ANGIOGRAM N/A 11/10/2012   Procedure: LEFT HEART CATHETERIZATION WITH CORONARY ANGIOGRAM;  Surgeon: Burnell Blanks, MD;  Location[facility];  Service: Cardiovascular;  Laterality: N/A;  . LOOP RECORDER IMPLANT N/A 11/10/2012   Medtronic LinQ implanted by Dr Rayann Heman for afib management  . [place] SURGERY  2000; 2002  . POSTERIOR LUMBAR FUSION  2004  . PROSTATE SURGERY  ~ 2009  . PVI/ CTI ablation  02/25/2009 and 09/2009   S/P afib/ CTI ablation  . SPLENECTOMY, TOTAL N/A 06/07/2019   Procedure: SPLENECTOMY;  Surgeon: Stark Klein, MD;  Location: [place];  Service: General;  Laterality: N/A;  . [place]   "broke in 3 places; had rod put in" (11/08/2012)  . TIBIA HARDWARE REMOVAL Right ~ 1991  . TONSILLECTOMY  1965  . TOTAL HIP ARTHROPLASTY Right 12/18/2014   Procedure: RIGHT TOTAL HIP ARTHROPLASTY ANTERIOR APPROACH;  Surgeon: Gaynelle Arabian, MD;  Location: [facility];  Service: Orthopedics;  Laterality:  Right;  . UPPER ESOPHAGEAL ENDOSCOPIC ULTRASOUND (EUS) N/A 07/17/2018   Procedure: UPPER ESOPHAGEAL ENDOSCOPIC ULTRASOUND (EUS);  Surgeon: Irving Copas., MD;  Location: [place];  Service: Gastroenterology;  Laterality: N/A;    Current Outpatient Medications  Medication Sig Dispense Refill  . acetaminophen (TYLENOL) 500 MG tablet Take 1,000 mg by mouth every 6 (six) hours as needed for mild pain, fever or headache.    Marland Kitchen amiodarone (PACERONE) 200 MG tablet Take 200 mg by mouth daily.    . Cyanocobalamin (VITAMIN B-12 IJ) Inject 1,000 mcg as directed every 30 (thirty) days.    Marland Kitchen docusate sodium (COLACE) 100 MG capsule Take 1 capsule (100 mg total) by mouth 2 (two)  times daily as needed for mild constipation. 10 capsule 0  . ELIQUIS 5 MG TABS tablet TAKE 1 TABLET BY MOUTH TWICE A DAY 180 tablet 2  . escitalopram (LEXAPRO) 10 MG tablet Take 10 mg by mouth daily.  1  . fenofibrate 160 MG tablet Take 160 mg by mouth daily.    . ferrous sulfate 325 (65 FE) MG tablet Take 325 mg by mouth See admin instructions. Monday Wednesday Friday    . gabapentin (NEURONTIN) 300 MG capsule Take 1 capsule (300 mg total) by mouth 3 (three) times daily. 90 capsule 0  . glimepiride (AMARYL) 1 MG tablet Take 1 mg by mouth 2 (two) times daily.    . Insulin Pen Needle 29G X 5MM MISC 1 Device by Does not apply route 3 (three) times daily as needed (for use with insulin pen). 100 each 0  . JANUVIA 50 MG tablet Take 50 mg by mouth daily.    Marland Kitchen LANTUS SOLOSTAR 100 UNIT/ML Solostar Pen Inject 24 Units into the skin at bedtime.     Marland Kitchen losartan (COZAAR) 50 MG tablet Take 1 tablet by mouth daily.    . metFORMIN (GLUCOPHAGE) 500 MG tablet Take 500 mg by mouth 2 (two) times daily.    . metoprolol tartrate (LOPRESSOR) 25 MG tablet TAKE 0.5 TABLETS (12.5 MG TOTAL) BY MOUTH 2 (TWO) TIMES DAILY. (Patient taking differently: Take 25 mg by mouth daily.) 90 tablet 1  . oxyCODONE-acetaminophen (PERCOCET) 10-325 MG tablet Take 1  tablet by mouth every 4 (four) hours.    . pantoprazole (PROTONIX) 40 MG tablet TAKE 1 TABLET BY MOUTH TWICE(2) DAILY 180 tablet 1  . polyethylene glycol (MIRALAX / GLYCOLAX) 17 g packet Take 17 g by mouth daily. 14 each 0  . Tamsulosin HCl (FLOMAX) 0.4 MG CAPS Take 0.4 mg by mouth daily.    . traZODone (DESYREL) 50 MG tablet Take 100 mg by mouth at bedtime.      No current facility-administered medications for this visit.    Allergies:   Penicillins   Social History:  The patient  reports that he quit smoking about 16 years ago. His smoking use included cigarettes. He has a 114.00 pack-year smoking history. He quit smokeless tobacco use about 16 years ago.  His smokeless tobacco use included chew. He reports previous alcohol use. He reports that he does not use drugs.   Family History:  The patient's family history includes Diabetes in his father; Emphysema in his maternal grandfather and mother; Heart attack in his father; Heart disease in his father and paternal grandfather; Hypertension in his father.  ROS:  Please see the history of present illness.   All other systems are reviewed and otherwise negative.   PHYSICAL EXAM:  VS:  BP (!) 102/58 (BP Location: Left Arm, Patient Position: Sitting, Cuff Size: Normal)   Pulse 70   Ht 6' (1.829 m)   Wt 242 lb (109.8 kg)   SpO2 91%   BMI 32.82 kg/m  BMI: Body mass index is 32.82 kg/m.  A recheck on his O2 sat was 95% Well nourished, well developed, in no acute distress, appears chronically ill, older then his age 8: normocephalic, atraumatic  Neck: no JVD, carotid bruits or masses Cardiac:  RRR, no significant murmurs, no rubs, or gallops Lungs:  CTA b/l, no wheezing, rhonchi or rales  Abd: abd is soft and nontender to light palpation MS: no deformity, advanced atrophy Ext: he has no edema, Skin: warm and dry, no  rash, not diaphoretic Neuro:  No gross deficits appreciated Psych: euthymic mood, full affect   EKG:  Done today  and reviewed by myself Sinus arrhythmia 64bpm, no ST/T changes, unchanged from prior  12/17/2016: TTE Study Conclusions  - Left ventricle: The cavity size was normal. Wall thickness was  increased in a pattern of mild LVH. Systolic function was normal.  The estimated ejection fraction was in the range of 55% to 60%.  Wall motion was normal; there were no regional wall motion  abnormalities. Left ventricular diastolic function parameters  were normal.  - Left atrium: The atrium was mildly dilated.    11/10/2012: [facility] Angiographic Findings:  Left main: Aneurysmal segment at distal portion of vessel. Unchanged from last cath. No obstructive disease.   Left Anterior Descending Artery: Large caliber vessel that courses to the apex. There are two small caliber diagonal branches. The proximal segment of the LAD was aneurysmal. The mid LAD just beyond the diagonal branch has a focal 50% stenosis, unchanged from last cath in 2011. The stent in the mid LAD is patent without restenosis. The distal vessel has mild plaque disease.   Circumflex Artery: Large caliber vessel with aneurysmal proximal segment. The first obtuse marginal branch is bifurcating vessel with mild plaque disease. The distal AV groove Circumflex has diffuse 30% stenosis.   Right Coronary Artery: Large dominant vessel with proximal 30% stenosis, serial 20% stenoses in the mid and distal vessel.   Left Ventricular Angiogram: Deferred.   Impression: 1. Stable single vessel CAD with patent stent mid LAD, stable moderate stenosis mid LAD   Recent Labs: 06/13/2019: TSH 1.776 01/01/2020: ALT 14 01/07/2020: BUN 18; Creatinine, Ser 1.29; Hemoglobin 11.5; Magnesium 1.6; Platelets 382; Potassium 4.9; Sodium 133  No results found for requested labs within last 8760 hours.   CrCl cannot be calculated (Patient's most recent lab result is older than the maximum 21 days allowed.).   Wt Readings from Last 3 Encounters:   05/12/20 242 lb (109.8 kg)  12/31/19 228 lb (103.4 kg)  11/05/19 233 lb (105.7 kg)     Other studies reviewed: Additional studies/records reviewed today include: summarized above    ASSESSMENT AND PLAN:  1. Paroxysmal AFib     s/p CTI/PVI ablation 02/2009 and 09/2009 (AVNRT slow pathway ablated 2007)     CHA2DS2Vasc is 4, on Eliquis,  appropriately dosed       In the last few months he has had some brief episodes of palpitations, says he is sure is his AFib, feels just like it always has and when in it it brief but fast making him feel poorly Reduce his losartan to 19m daily and increase his lopressor to 5109mBID   2. CAD     When in RVR he feels a bit of heaviness in his chest, none otherwise      No ASA with Eliquis, on BB, and fenofibrate     Labs/lipids are monitored with his PMD  Will start with an echo, may need stress testing Follow May benefit from gen cardiology following  3. HTN     Has had relative hypotension and in the past required mididrine     He gets similar BP s to today at home, some  SBP in the 90's     Med adjustment as above    4. Weight is up     He says this has been slow and steady, his lungs are clear, no edema is noted  Check echo     Disposition: as above, see him back in 3-4 weeks, sooner if needed.  He will let us know iif he is getting low BPs, if LVEF is OK, may stop the ARB to avoid hypotension and allow more BB if needed.   Current medicines are reviewed at length with the patient today.  The patient did not have any concerns regarding medicines  Signed, Tommye Standard, PA-C 05/12/2020 9:56 AM     [place] [address] [place] [address] [phone] (office)  [phone] (fax)

## 2020-05-12 ENCOUNTER — Ambulatory Visit (INDEPENDENT_AMBULATORY_CARE_PROVIDER_SITE_OTHER): Payer: Medicare HMO | Admitting: Physician Assistant

## 2020-05-12 ENCOUNTER — Other Ambulatory Visit: Payer: Self-pay

## 2020-05-12 ENCOUNTER — Encounter: Payer: Self-pay | Admitting: Physician Assistant

## 2020-05-12 VITALS — BP 102/58 | HR 70 | Ht 72.0 in | Wt 242.0 lb

## 2020-05-12 DIAGNOSIS — I2510 Atherosclerotic heart disease of native coronary artery without angina pectoris: Secondary | ICD-10-CM

## 2020-05-12 DIAGNOSIS — R079 Chest pain, unspecified: Secondary | ICD-10-CM | POA: Diagnosis not present

## 2020-05-12 DIAGNOSIS — R002 Palpitations: Secondary | ICD-10-CM | POA: Diagnosis not present

## 2020-05-12 DIAGNOSIS — I480 Paroxysmal atrial fibrillation: Secondary | ICD-10-CM | POA: Diagnosis not present

## 2020-05-12 DIAGNOSIS — I10 Essential (primary) hypertension: Secondary | ICD-10-CM

## 2020-05-12 MED ORDER — METOPROLOL TARTRATE 50 MG PO TABS: 50.0000 mg | ORAL_TABLET | Freq: Every day | ORAL | 1 refills | Status: DC

## 2020-05-12 MED ORDER — LOSARTAN POTASSIUM 25 MG PO TABS: 25.0000 mg | ORAL_TABLET | Freq: Every day | ORAL | 3 refills | Status: DC

## 2020-05-12 NOTE — Patient Instructions (Signed)
Medication Instructions:    START TAKING : COZAAR 25 MG ONCE A DAY   START TAKING :  LOPRESSOR(METOPROLOL) 50 MG ONCE A DAY   *If you need a refill on your cardiac medications before your next appointment, please call your pharmacy*   Lab Work: NONE ORDERED  TODAY   If you have labs (blood work) drawn today and your tests are completely normal, you will receive your results only by: Marland Kitchen MyChart Message (if you have MyChart) OR . A paper copy in the mail If you have any lab test that is abnormal or we need to change your treatment, we will call you to review the results.   Testing/Procedures: Your physician has requested that you have an echocardiogram. Echocardiography is a painless test that uses sound waves to create images of your heart. It provides your doctor with information about the size and shape of your heart and how well your heart's chambers and valves are working. This procedure takes approximately one hour. There are no restrictions for this procedure.    Follow-Up: At [facility], you and your health needs are our priority.  As part of our continuing mission to provide you with exceptional heart care, we have created designated Provider Care Teams.  These Care Teams include your primary Cardiologist (physician) and Advanced Practice Providers (APPs -  Physician Assistants and Nurse Practitioners) who all work together to provide you with the care you need, when you need it.  We recommend signing up for the patient portal called "MyChart".  Sign up information is provided on this After Visit Summary.  MyChart is used to connect with patients for Virtual Visits (Telemedicine).  Patients are able to view lab/test results, encounter notes, upcoming appointments, etc.  Non-urgent messages can be sent to your provider as well.   To learn more about what you can do with MyChart, go to [email or web address].    Your next appointment:   3 week(s)  The format for your next  appointment:   In Person  Provider:   Tommye Standard, PA-C   Other Instructions

## 2020-05-27 ENCOUNTER — Telehealth: Payer: Self-pay | Admitting: *Deleted

## 2020-05-27 MED ORDER — METOPROLOL TARTRATE 50 MG PO TABS
25.0000 mg | ORAL_TABLET | Freq: Two times a day (BID) | ORAL | 1 refills | Status: DC
Start: 2020-05-27 — End: 2020-11-17

## 2020-05-27 NOTE — Telephone Encounter (Signed)
Spoke with patient Dtr who called with patient concerns of new start medications Cozaar and Lopressor may be reasoning of blood pressure fluctuation. Patient Dtr faxed over patient record keeping of blood pressures  and was told that provider will take a look at record keeping of blood pressures and contact her back with recommendations.Patient Dtr with recommendations.    Per provider contacted patient to see about symptoms such as dizziness, lightheaded, chest pain of any sort. Patient has no symptoms and feels better since splitting Lopressor 50 mg tablet in half and take twice a day. Per Renee recommendations patient is to stop Losartan continue Lopressor with monitoring and keeping blood pressure log and keep echo and follow up visit as scheduled. Patient understood instructions and will contact Dtr about updates.

## 2020-05-28 ENCOUNTER — Other Ambulatory Visit (HOSPITAL_COMMUNITY): Payer: Medicare HMO

## 2020-06-09 ENCOUNTER — Ambulatory Visit: Payer: Medicare HMO | Admitting: Physician Assistant

## 2020-06-09 ENCOUNTER — Other Ambulatory Visit: Payer: Self-pay

## 2020-06-09 MED ORDER — PANTOPRAZOLE SODIUM 40 MG PO TBEC: DELAYED_RELEASE_TABLET | ORAL | 0 refills | Status: DC

## 2020-06-09 NOTE — Telephone Encounter (Signed)
Recd fax request from [facility] for refill on Pantoprazole. Rx sent to pharmacy.

## 2020-06-13 ENCOUNTER — Ambulatory Visit (HOSPITAL_COMMUNITY): Payer: Medicare HMO | Attending: Internal Medicine

## 2020-06-13 ENCOUNTER — Other Ambulatory Visit: Payer: Self-pay

## 2020-06-13 DIAGNOSIS — R079 Chest pain, unspecified: Secondary | ICD-10-CM | POA: Diagnosis present

## 2020-06-13 DIAGNOSIS — R002 Palpitations: Secondary | ICD-10-CM | POA: Diagnosis present

## 2020-06-13 LAB — ECHOCARDIOGRAM COMPLETE
Area-P 1/2: 2.11 cm2
S' Lateral: 2.9 cm

## 2020-06-17 ENCOUNTER — Other Ambulatory Visit: Payer: Self-pay | Admitting: *Deleted

## 2020-06-18 ENCOUNTER — Other Ambulatory Visit: Payer: Self-pay

## 2020-06-18 ENCOUNTER — Other Ambulatory Visit: Payer: Medicare HMO | Admitting: *Deleted

## 2020-06-18 DIAGNOSIS — I2510 Atherosclerotic heart disease of native coronary artery without angina pectoris: Secondary | ICD-10-CM

## 2020-06-18 DIAGNOSIS — I10 Essential (primary) hypertension: Secondary | ICD-10-CM

## 2020-06-18 LAB — BASIC METABOLIC PANEL
BUN/Creatinine Ratio: 9 — ABNORMAL LOW (ref 10–24)
BUN: 12 mg/dL (ref 8–27)
CO2: 25 mmol/L (ref 20–29)
Calcium: 9.3 mg/dL (ref 8.6–10.2)
Chloride: 95 mmol/L — ABNORMAL LOW (ref 96–106)
Creatinine, Ser: 1.33 mg/dL — ABNORMAL HIGH (ref 0.76–1.27)
Glucose: 243 mg/dL — ABNORMAL HIGH (ref 65–99)
Potassium: 5 mmol/L (ref 3.5–5.2)
Sodium: 133 mmol/L — ABNORMAL LOW (ref 134–144)
eGFR: 58 mL/min/{1.73_m2} — ABNORMAL LOW (ref >59)

## 2020-06-23 NOTE — Progress Notes (Signed)
Cardiology Office Note Date:  06/24/2020  Patient ID:  Wayde, Gopaul July 25, 1950, MRN [id number] PCP:  [name], MD  Electrophysiologist:  Dr. Rayann Heman   Chief Complaint:  planned f/u   History of Present Illness: JEOVANI WEISENBURGER is a 70 y.o. male with history of CAD (PCI 2006 LAD, notes report cath Oct 2014 revealed stable CAD with patent stent to mid LAD), COPD, HTN, HLD, hyperthyroidism, OSA (not on CPAP), AFIB, flutter (s/p CTI/PVI ablation 02/2009 and 09/2009), DM, SVT (AVNRT slow pathway ablated 2007), recurrent pancreatitis and pancreatic pseudocysts >>> s/p distal pancreatectomy, splenectomy, debridement of pancreatic necrosis 06/07/2019 (peri-op orthostatic hypotension required midodrine for a while) .  I saw him 08/01/19 He is doing well.  He denies any palpitations, no CP, SOB.  He denies dizzy spells, no near syncope or syncope, no symptoms of orthostatic dizziness. No bleeding or signs of bleeding He reports BPs generally 120's, some have been as high as 150's, though infrequently, he denies any BP readings <100 Negative orthostatics in the office, recommended to reduce his midodrine and continue f/u with his PMD for management, of this ans his diuresis given 2/2 ascites/portal HTN  Phone notes with subsequent reports of being weaned off midodrine  He saw Dr. Rayann Heman in Nov 2021, doing well, no changes were made.  Planned for 6 mo follow up  Has been following with endo with difficult DM management.  He had a hospital stay Dec 2021 after an accident with his tractor.  He was working on it and appraently did not have the tired blocked and accidentally hit the lever and it went forward and ran him over.  Unable to get up layed there for hours until a neighbor kid came home from school. He has done well with his recivery.  I saw him 05/12/20 He has some DOE though reports this not new, and only when he has to walk a long way or is doing heavier work.  Dates this back "oh  quite a long time" prehaps years. No DOE with his usual ADLs He feels like he has had a couple episodes of AFib in the last couple pmonths they are brief 3-4 minutes. Once about 85mnutes and made him feel weak. He feels his heart take off and feels a little discomfort and weak. A week or so ago was driving and felt it start and had to pull off the road for his wife to drive. He denies CP or discomfort any othertime, only when his HR is fast/in Afib. No dizziness otherwise either. His med list is much different then the med list he brings Amiodarone is listed, appears to have been started in the hospital but he denies ever having it at home. Says the meds with him today are the only current medicines except a "water pill" that he rarely uses Denies bleeding or signs of bleeding  His losartan reduced and metoprolol increased, planned for an echo. Echo noted LVEF 60-65%, no WMA, LA mildly dilated Ascending AO 458mPlanned for CT angio chest to include coronaries.  TODAY He is off the losartan and taking metoprolol 2577mID and he reports NOT taking amiodarone for sure, and had not started it that he thinks at least whenever it was added to his list with us.Koreae has not had any recurrent palpitations or symptoms of AFib. He had been to the beach and remains active and denies any ongoing SOB/DOE, no CP His BP have been if anything on  the hgher end with only a couple with SBP in the 90's. His DBP is often 90's-100s SBP 110's-140's No dizzy spells, near syncope or syncope. No bleeding or signs of bleeding  Past Medical History:  Diagnosis Date  . Anemia   . Arthritis   . Asthma   . CAD (coronary artery disease)    a. S/P PCI mid LAD (vision stent) 08/31/2004;  b. repeat cath 6/11 - no progression of CAD. c. Cath 10/2012 given moderate CAD on cardiac CT - no change from prior cath.  . Chest pain 12/16/2016  . COPD (chronic obstructive pulmonary disease) ([place])   . COVID-19 virus infection    . Diverticulitis   . DJD (degenerative joint disease) of lumbar spine   . Dyslipidemia   . Gallstones   . GERD (gastroesophageal reflux disease)   . History of kidney stones   . Hypertension   . Hyperthyroidism   . Melanoma ([place])    melanoma removed from neck   . On home oxygen therapy    "2L q hs" (11/08/2012),  06/04/19 pt. not on oxygen  . OSA (obstructive sleep apnea)    a. cpap noncompliance- patient reports on 12/10/14- does not use CPAP  . Palpitations    a. 10/2012 - s/p LINQ loop recorder to assess for arrhythmia.   . Pancreatitis   . Paroxysmal A-fib ([place])    a. S/P PVI 2/11 and 9/11  . PICC (peripherally inserted central catheter) in place   . Pneumonia   . SVT (supraventricular tachycardia) ([place])    a. Atypical AVNRT of the slow pathway  . Type II diabetes mellitus ([place])     Past Surgical History:  Procedure Laterality Date  . ATRIAL ABLATION SURGERY  2007   S/P slow pathway ablation for atypical AVNRT   . BIOPSY  07/17/2018   Procedure: BIOPSY;  Surgeon: Rush Landmark Telford Nab., MD;  Location: [place];  Service: Gastroenterology;;  . [facility]  ~ 2008  . COLON RESECTION  02/2013   . COLONOSCOPY  12/25/2007   Colonic polyp status post polypectomy. Internal hemorrhoids.   . CORONARY ANGIOPLASTY WITH STENT PLACEMENT  08/30/2004   Vision; to LAD   . CYSTOSCOPY W/ STONE MANIPULATION  ~ 1998   "once" (11/08/2012)  . EP Study  2008   Negative EP study in [place]  . ESOPHAGOGASTRODUODENOSCOPY (EGD) WITH PROPOFOL N/A 07/17/2018   Procedure: ESOPHAGOGASTRODUODENOSCOPY (EGD) WITH PROPOFOL;  Surgeon: Rush Landmark Telford Nab., MD;  Location: [place];  Service: Gastroenterology;  Laterality: N/A;  . FRACTURE SURGERY    . LEFT HEART CATHETERIZATION WITH CORONARY ANGIOGRAM N/A 11/10/2012   Procedure: LEFT HEART CATHETERIZATION WITH CORONARY ANGIOGRAM;  Surgeon: Burnell Blanks, MD;  Location: [facility] CATH LAB;  Service: Cardiovascular;  Laterality: N/A;  .  LOOP RECORDER IMPLANT N/A 11/10/2012   Medtronic LinQ implanted by Dr Rayann Heman for afib management  . [place] SURGERY  2000; 2002  . POSTERIOR LUMBAR FUSION  2004  . PROSTATE SURGERY  ~ 2009  . PVI/ CTI ablation  02/25/2009 and 09/2009   S/P afib/ CTI ablation  . SPLENECTOMY, TOTAL N/A 06/07/2019   Procedure: SPLENECTOMY;  Surgeon: Stark Klein, MD;  Location: [place];  Service: General;  Laterality: N/A;  . [place]   "broke in 3 places; had rod put in" (11/08/2012)  . TIBIA HARDWARE REMOVAL Right ~ 1991  . TONSILLECTOMY  1965  . TOTAL HIP ARTHROPLASTY Right 12/18/2014   Procedure: RIGHT TOTAL HIP ARTHROPLASTY  ANTERIOR APPROACH;  Surgeon: Gaynelle Arabian, MD;  Location: [facility];  Service: Orthopedics;  Laterality: Right;  . UPPER ESOPHAGEAL ENDOSCOPIC ULTRASOUND (EUS) N/A 07/17/2018   Procedure: UPPER ESOPHAGEAL ENDOSCOPIC ULTRASOUND (EUS);  Surgeon: Irving Copas., MD;  Location: [place];  Service: Gastroenterology;  Laterality: N/A;    Current Outpatient Medications  Medication Sig Dispense Refill  . acetaminophen (TYLENOL) 500 MG tablet Take 1,000 mg by mouth every 6 (six) hours as needed for mild pain, fever or headache.    . Cyanocobalamin (VITAMIN B-12 IJ) Inject 1,000 mcg as directed every 30 (thirty) days.    Marland Kitchen docusate sodium (COLACE) 100 MG capsule Take 1 capsule (100 mg total) by mouth 2 (two) times daily as needed for mild constipation. 10 capsule 0  . ELIQUIS 5 MG TABS tablet TAKE 1 TABLET BY MOUTH TWICE A DAY 180 tablet 2  . escitalopram (LEXAPRO) 10 MG tablet Take 10 mg by mouth daily.  1  . fenofibrate 160 MG tablet Take 160 mg by mouth daily.    . ferrous sulfate 325 (65 FE) MG tablet Take 325 mg by mouth See admin instructions. Monday Wednesday Friday    . gabapentin (NEURONTIN) 300 MG capsule Take 1 capsule (300 mg total) by mouth 3 (three) times daily. 90 capsule 0  . glimepiride (AMARYL) 1 MG tablet Take 1 mg by mouth 2 (two) times  daily.    . Insulin Pen Needle 29G X 5MM MISC 1 Device by Does not apply route 3 (three) times daily as needed (for use with insulin pen). 100 each 0  . JANUVIA 50 MG tablet Take 50 mg by mouth daily.    Marland Kitchen LANTUS SOLOSTAR 100 UNIT/ML Solostar Pen Inject 24 Units into the skin at bedtime.     Marland Kitchen losartan (COZAAR) 25 MG tablet Take 12.5 mg by mouth daily.    . metFORMIN (GLUCOPHAGE) 500 MG tablet Take 500 mg by mouth 2 (two) times daily.    . metoprolol tartrate (LOPRESSOR) 50 MG tablet Take 0.5 tablets (25 mg total) by mouth 2 (two) times daily. 90 tablet 1  . oxyCODONE-acetaminophen (PERCOCET) 10-325 MG tablet Take 1 tablet by mouth every 4 (four) hours.    . pantoprazole (PROTONIX) 40 MG tablet TAKE 1 TABLET BY MOUTH TWICE(2) DAILY 60 tablet 0  . Tamsulosin HCl (FLOMAX) 0.4 MG CAPS Take 0.4 mg by mouth daily.    . traZODone (DESYREL) 50 MG tablet Take 100 mg by mouth at bedtime.      No current facility-administered medications for this visit.    Allergies:   Penicillins   Social History:  The patient  reports that he quit smoking about 16 years ago. His smoking use included cigarettes. He has a 114.00 pack-year smoking history. He quit smokeless tobacco use about 16 years ago.  His smokeless tobacco use included chew. He reports previous alcohol use. He reports that he does not use drugs.   Family History:  The patient's family history includes Diabetes in his father; Emphysema in his maternal grandfather and mother; Heart attack in his father; Heart disease in his father and paternal grandfather; Hypertension in his father.  ROS:  Please see the history of present illness.   All other systems are reviewed and otherwise negative.   PHYSICAL EXAM:  VS:  BP 110/60   Pulse 70   Ht 6' (1.829 m)   Wt 246 lb (111.6 kg)   SpO2 92%   BMI 33.36 kg/m  BMI: Body mass  index is 33.36 kg/m.  A recheck on his O2 sat was 95% Well nourished, well developed, in no acute distress, appears chronically  ill, older then his age 87: normocephalic, atraumatic  Neck: no JVD, carotid bruits or masses Cardiac:  RRR, no significant murmurs, no rubs, or gallops Lungs:  CTA b/l, no wheezing, rhonchi or rales  Abd: abd is soft and nontender to light palpation MS: no deformity, advanced atrophy Ext:  he has no edema,many b/l varicose veins  Skin: warm and dry, no rash, not diaphoretic Neuro:  No gross deficits appreciated Psych: euthymic mood, full affect   EKG:  Done today and reviewed by myself Not done today   06/13/20: TTE IMPRESSIONS  1. Left ventricular ejection fraction, by estimation, is 60 to 65%. The  left ventricle has normal function. The left ventricle has no regional  wall motion abnormalities. Left ventricular diastolic parameters are  indeterminate.  2. Right ventricular systolic function is normal. The right ventricular  size is normal.  3. Left atrial size was mildly dilated.  4. Right atrial size was mildly dilated.  5. The mitral valve is grossly normal. No evidence of mitral valve  regurgitation.  6. The aortic valve is grossly normal. Aortic valve regurgitation is not  visualized. No aortic stenosis is present.  7. Aortic dilatation noted. There is moderate dilatation of the ascending  aorta, measuring 46 mm. There is moderate dilatation of the aortic root,  measuring 46 mm.  8. The inferior vena cava is normal in size with greater than 50%  respiratory variability, suggesting right atrial pressure of 3 mmHg.   Comparison(s): A prior study was performed on 12/17/2016. Prior images  reviewed side by side. Increase in aortic root and ascending aortic  measurements from prior study.     12/17/2016: TTE Study Conclusions  - Left ventricle: The cavity size was normal. Wall thickness was  increased in a pattern of mild LVH. Systolic function was normal.  The estimated ejection fraction was in the range of 55% to 60%.  Wall motion was normal; there  were no regional wall motion  abnormalities. Left ventricular diastolic function parameters  were normal.  - Left atrium: The atrium was mildly dilated.    11/10/2012: [facility] Angiographic Findings:  Left main: Aneurysmal segment at distal portion of vessel. Unchanged from last cath. No obstructive disease.   Left Anterior Descending Artery: Large caliber vessel that courses to the apex. There are two small caliber diagonal branches. The proximal segment of the LAD was aneurysmal. The mid LAD just beyond the diagonal branch has a focal 50% stenosis, unchanged from last cath in 2011. The stent in the mid LAD is patent without restenosis. The distal vessel has mild plaque disease.   Circumflex Artery: Large caliber vessel with aneurysmal proximal segment. The first obtuse marginal branch is bifurcating vessel with mild plaque disease. The distal AV groove Circumflex has diffuse 30% stenosis.   Right Coronary Artery: Large dominant vessel with proximal 30% stenosis, serial 20% stenoses in the mid and distal vessel.   Left Ventricular Angiogram: Deferred.   Impression: 1. Stable single vessel CAD with patent stent mid LAD, stable moderate stenosis mid LAD   Recent Labs: 01/01/2020: ALT 14 01/07/2020: Hemoglobin 11.5; Magnesium 1.6; Platelets 382 06/18/2020: BUN 12; Creatinine, Ser 1.33; Potassium 5.0; Sodium 133  No results found for requested labs within last 8760 hours.   Estimated Creatinine Clearance: 68.6 mL/min (A) (by C-G formula based on SCr of 1.33 mg/dL (H)).  Wt Readings from Last 3 Encounters:  06/24/20 246 lb (111.6 kg)  05/12/20 242 lb (109.8 kg)  12/31/19 228 lb (103.4 kg)     Other studies reviewed: Additional studies/records reviewed today include: summarized above    ASSESSMENT AND PLAN:  1. Paroxysmal AFib     s/p CTI/PVI ablation 02/2009 and 09/2009 (AVNRT slow pathway ablated 2007)     CHA2DS2Vasc is 4, on Eliquis, appropriately dosed     None since  his last visit   2. CAD     When in RVR he feels a bit of heaviness in his chest, none otherwise     No ASA with Eliquis, on BB, and fenofibrate     Labs/lipids are monitored with his PMD       3. HTN     Has had relative hypotension and in the past required mididrine     DBP tends to run high, he would like not to be on a diuretic, already having issues with urinary urgency.     Will add back losartan 47m 1/2 tab (12.518m daily     If he starts to run into low BPs will need to perhaps allow some higher numbers     He will let usKoreanow how his BP is doing, how he is feeling   4. Dilated Ao/aortic root     Will get CT chest to include coronaries given he has mentioned a component of some chest heaviness when in AFib      Discussed contrast shortage and may be a few weeks before it gets done.     Disposition: Will have him back in 80m10moooner if needed   Current medicines are reviewed at length with the patient today.  The patient did not have any concerns regarding medicines  Signed, RenTommye StandardA-C 06/24/2020 8:37 AM     [place] [phone])  [phone])

## 2020-06-24 ENCOUNTER — Encounter: Payer: Self-pay | Admitting: Physician Assistant

## 2020-06-24 ENCOUNTER — Other Ambulatory Visit: Payer: Self-pay

## 2020-06-24 ENCOUNTER — Ambulatory Visit (INDEPENDENT_AMBULATORY_CARE_PROVIDER_SITE_OTHER): Payer: Medicare HMO | Admitting: Physician Assistant

## 2020-06-24 VITALS — BP 110/60 | HR 70 | Ht 72.0 in | Wt 246.0 lb

## 2020-06-24 DIAGNOSIS — R079 Chest pain, unspecified: Secondary | ICD-10-CM | POA: Diagnosis not present

## 2020-06-24 DIAGNOSIS — I2510 Atherosclerotic heart disease of native coronary artery without angina pectoris: Secondary | ICD-10-CM | POA: Diagnosis not present

## 2020-06-24 DIAGNOSIS — I480 Paroxysmal atrial fibrillation: Secondary | ICD-10-CM | POA: Diagnosis not present

## 2020-06-24 DIAGNOSIS — I77810 Thoracic aortic ectasia: Secondary | ICD-10-CM | POA: Diagnosis not present

## 2020-06-24 DIAGNOSIS — I10 Essential (primary) hypertension: Secondary | ICD-10-CM

## 2020-06-24 MED ORDER — LOSARTAN POTASSIUM 25 MG PO TABS: 12.5000 mg | ORAL_TABLET | Freq: Every day | ORAL | 1 refills | Status: DC

## 2020-06-24 NOTE — Patient Instructions (Addendum)
Medication Instructions:   START TAKING LOSARTAN 12.5 MG ONCE A D AY (HALF OF 25 MG TABLET)  *If you need a refill on your cardiac medications before your next appointment, please call your pharmacy*   Lab Work: NONE ORDERED  TODAY   If you have labs (blood work) drawn today and your tests are completely normal, you will receive your results only by: Marland Kitchen MyChart Message (if you have MyChart) OR . A paper copy in the mail If you have any lab test that is abnormal or we need to change your treatment, we will call you to review the results.   Testing/Procedures:  (WILL BE CONTACT BACK FOR SCHEDULING PROTOCOL)Non-Cardiac CT Angiography (CTA), is a special type of CT scan that uses a computer to produce multi-dimensional views of major blood vessels throughout the body. In CT angiography, a contrast material is injected through an IV to help visualize the blood vessels   Follow-Up: At [facility], you and your health needs are our priority.  As part of our continuing mission to provide you with exceptional heart care, we have created designated Provider Care Teams.  These Care Teams include your primary Cardiologist (physician) and Advanced Practice Providers (APPs -  Physician Assistants and Nurse Practitioners) who all work together to provide you with the care you need, when you need it.  We recommend signing up for the patient portal called "MyChart".  Sign up information is provided on this After Visit Summary.  MyChart is used to connect with patients for Virtual Visits (Telemedicine).  Patients are able to view lab/test results, encounter notes, upcoming appointments, etc.  Non-urgent messages can be sent to your provider as well.   To learn more about what you can do with MyChart, go to [email or web address].    Your next appointment:   6 month(s)  The format for your next appointment:   In Person  Provider:   Tommye Standard, PA-C   Other Instructions   Your cardiac CT will  be scheduled at one of the below locations:   [facility] [address] [place], [place] [address] [phone]  [place] [address] [place], [place] [address] [phone]  If scheduled at [facility], please arrive at the [facility] main entrance (entrance A) of [facility] 30 minutes prior to test start time. Proceed to the [facility] Radiology Department (first floor) to check-in and test prep.  If scheduled at [facility], please arrive 15 mins early for check-in and test prep.  Please follow these instructions carefully (unless otherwise directed):  Hold all erectile dysfunction medications at least 3 days (72 hrs) prior to test.  On the Night Before the Test: . Be sure to Drink plenty of water. . Do not consume any caffeinated/decaffeinated beverages or chocolate 12 hours prior to your test. . Do not take any antihistamines 12 hours prior to your test.  On the Day of the Test: . Drink plenty of water until 1 hour prior to the test. . Do not eat any food 4 hours prior to the test. . You may take your regular medications prior to the test.  . Take metoprolol (Lopressor) two hours prior to test. . HOLD  ANY Diuretics morning of the test.      After the Test: . Drink plenty of water. . After receiving IV contrast, you may experience a mild flushed feeling. This is normal. . On  occasion, you may experience a mild rash up to 24 hours after the test. This is not dangerous. If this occurs, you can take Benadryl 25 mg and increase your fluid intake. . If you experience trouble breathing, this can be serious. If it is severe call 911 IMMEDIATELY. If it is mild, please call our office. . If you take any of these medications: Glipizide/Metformin, Avandament, Glucavance, please do not take 48 hours after completing test unless otherwise instructed.   Once we have  confirmed authorization from your insurance company, we will call you to set up a date and time for your test. Based on how quickly your insurance processes prior authorizations requests, please allow up to 4 weeks to be contacted for scheduling your Cardiac CT appointment. Be advised that routine Cardiac CT appointments could be scheduled as many as 8 weeks after your provider has ordered it.  For non-scheduling related questions, please contact the cardiac imaging nurse navigator should you have any questions/concerns: Marchia Bond, Cardiac Imaging Nurse Navigator Gordy Clement, Cardiac Imaging Nurse Navigator [facility] Direct Office Dial: [phone]   For scheduling needs, including cancellations and rescheduling, please call [place], [phone].

## 2020-06-30 ENCOUNTER — Other Ambulatory Visit (HOSPITAL_COMMUNITY): Payer: Self-pay | Admitting: Physician Assistant

## 2020-06-30 ENCOUNTER — Other Ambulatory Visit: Payer: Self-pay | Admitting: Physician Assistant

## 2020-06-30 DIAGNOSIS — I77810 Thoracic aortic ectasia: Secondary | ICD-10-CM

## 2020-07-05 ENCOUNTER — Other Ambulatory Visit: Payer: Self-pay | Admitting: Gastroenterology

## 2020-07-15 ENCOUNTER — Telehealth (HOSPITAL_COMMUNITY): Payer: Self-pay | Admitting: *Deleted

## 2020-07-15 NOTE — Telephone Encounter (Signed)
Reaching out to patient to offer assistance regarding upcoming cardiac imaging study; pt verbalizes understanding of appt date/time, parking situation and where to check in, pre-test NPO status and medications ordered, and verified current allergies; name and call back number provided for further questions should they arise  Gordy Clement RN Navigator Cardiac Imaging [facility] [phone] office [phone] cell  Pt to take 68m metoprolol tartrate for double his regular morning dose 2 hours prior to cardiac CT scan.

## 2020-07-17 ENCOUNTER — Encounter (HOSPITAL_COMMUNITY): Payer: Self-pay

## 2020-07-17 ENCOUNTER — Other Ambulatory Visit: Payer: Self-pay

## 2020-07-17 ENCOUNTER — Ambulatory Visit (HOSPITAL_COMMUNITY)
Admission: RE | Admit: 2020-07-17 | Discharge: 2020-07-17 | Disposition: A | Discharge status: Home / Self Care | Payer: Medicare HMO | Source: Ambulatory Visit | Attending: Physician Assistant | Admitting: Physician Assistant

## 2020-07-17 DIAGNOSIS — I77810 Thoracic aortic ectasia: Secondary | ICD-10-CM | POA: Insufficient documentation

## 2020-07-17 DIAGNOSIS — R079 Chest pain, unspecified: Secondary | ICD-10-CM | POA: Insufficient documentation

## 2020-07-17 DIAGNOSIS — I2510 Atherosclerotic heart disease of native coronary artery without angina pectoris: Secondary | ICD-10-CM | POA: Insufficient documentation

## 2020-07-17 DIAGNOSIS — Z955 Presence of coronary angioplasty implant and graft: Secondary | ICD-10-CM | POA: Insufficient documentation

## 2020-07-17 MED ORDER — NITROGLYCERIN 0.4 MG SL SUBL
0.8000 mg | SUBLINGUAL_TABLET | Freq: Once | SUBLINGUAL | Status: AC
  Administered 2020-07-17: 0.8 mg via SUBLINGUAL

## 2020-07-17 MED ORDER — IOHEXOL 350 MG/ML SOLN
100.0000 mL | Freq: Once | INTRAVENOUS | Status: AC | PRN
  Administered 2020-07-17: 100 mL via INTRAVENOUS

## 2020-07-17 MED ORDER — NITROGLYCERIN 0.4 MG SL SUBL
SUBLINGUAL_TABLET | SUBLINGUAL | Status: AC
  Filled 2020-07-17: qty 2

## 2020-07-18 ENCOUNTER — Telehealth: Payer: Self-pay | Admitting: *Deleted

## 2020-07-18 ENCOUNTER — Other Ambulatory Visit: Payer: Self-pay | Admitting: Gastroenterology

## 2020-07-18 ENCOUNTER — Other Ambulatory Visit: Payer: Self-pay | Admitting: Internal Medicine

## 2020-07-18 ENCOUNTER — Other Ambulatory Visit: Payer: Self-pay | Admitting: *Deleted

## 2020-07-18 DIAGNOSIS — I2510 Atherosclerotic heart disease of native coronary artery without angina pectoris: Secondary | ICD-10-CM

## 2020-07-18 DIAGNOSIS — I77810 Thoracic aortic ectasia: Secondary | ICD-10-CM

## 2020-07-18 NOTE — Telephone Encounter (Signed)
Age 70, weight 112kg, SCr 1.33 on 06/18/20 LOV May 2022, afib

## 2020-07-18 NOTE — Telephone Encounter (Signed)
-----   Message from [facility], [place] sent at 07/18/2020 12:38 PM EDT ----- CT nite aorta stable and recommended follow up with another CT in 101mo  His coronaries were more difficult to see.  The stent he has made it hard to see that vessel well and there looks to be some narrowing of another.  I would like for him to have a lexiscan stress test please to help clarify these findings

## 2020-07-18 NOTE — Telephone Encounter (Signed)
Spoke with patient and aware of results and verbalized understanding.Patient agrees with recommendation of Lexiscan and awaiting phone call for scheduling

## 2020-09-07 IMAGING — CT CT ABDOMEN W/ CM
1 series · 11 of 40 positions shown, 13 images · IV contrast (Omnipaque)
Comparison: None.

CLINICAL DATA: Follow-up pancreatic pseudocysts.  Pancreatitis.

EXAM:
CT ABDOMEN WITH CONTRAST
TECHNIQUE: Multidetector CT imaging of the abdomen was performed using the
standard protocol following bolus administration of intravenous
contrast.
CONTRAST:  100mL OMNIPAQUE IOHEXOL 300 MG/ML  SOLN

[Series 5: coronal st · coronal · 0.61mm/px · 11 of 113 slices shown, 13 images]
[im 4/113  lung]
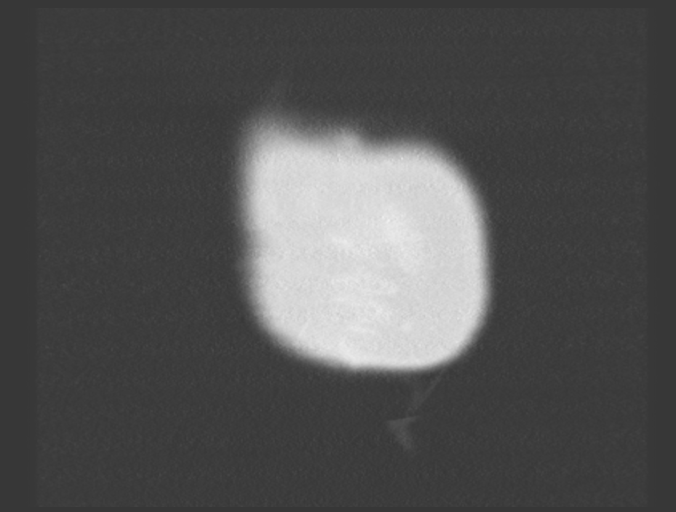
[im 8/113  lung]
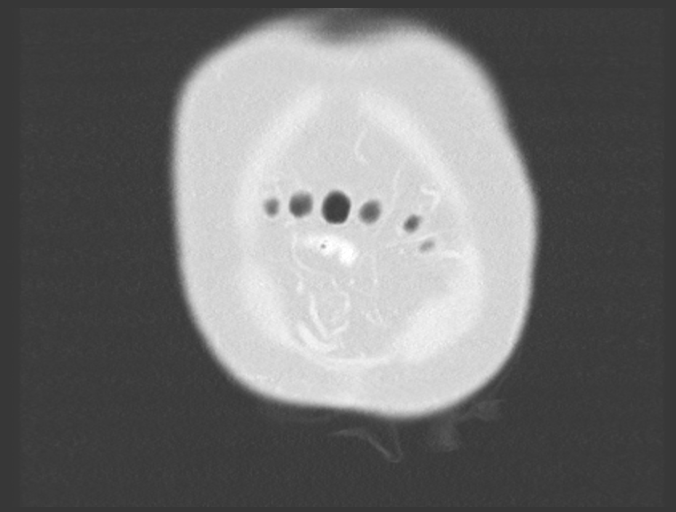
[im 11/113  soft-tissue]
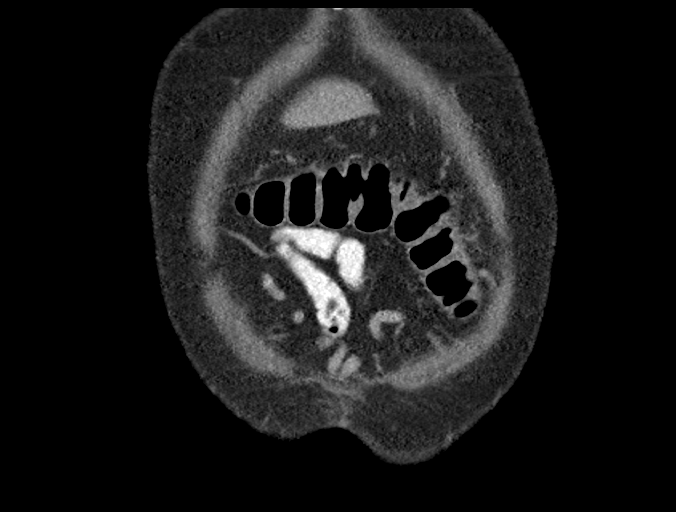
[im 11/113  lung]
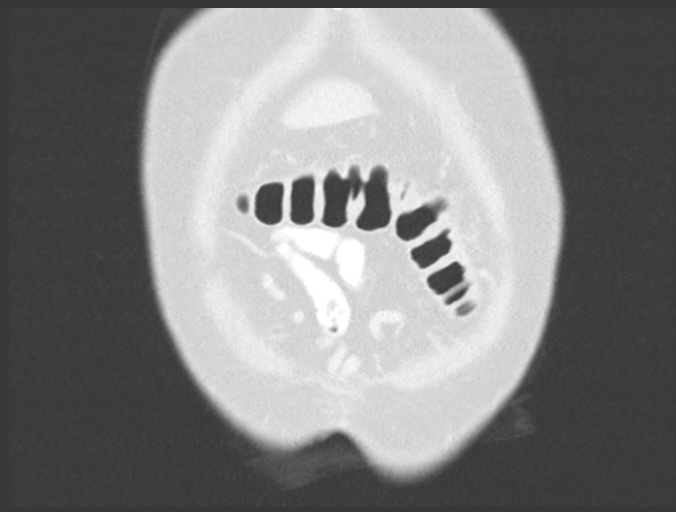
[im 11/113  bone]
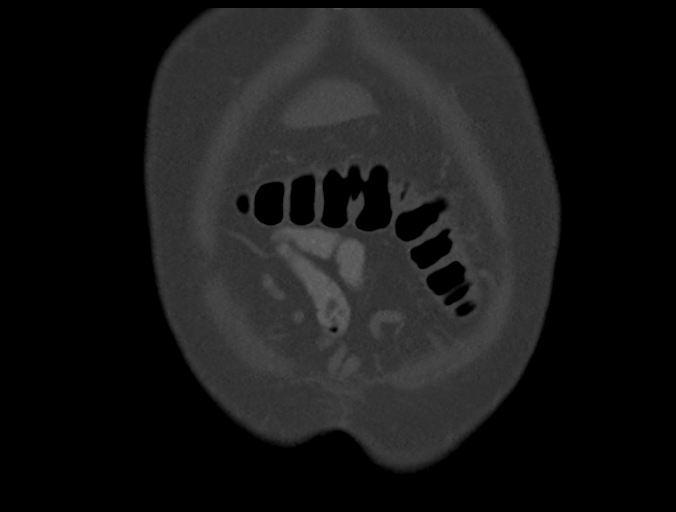
[im 15/113  lung]
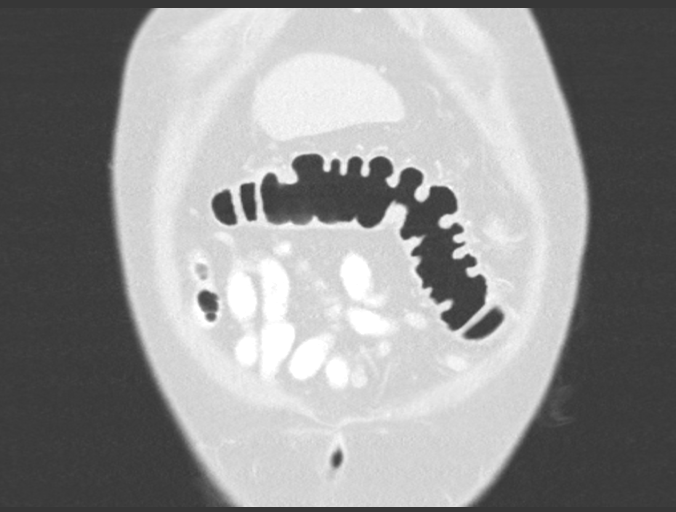
[im 25/113  soft-tissue]
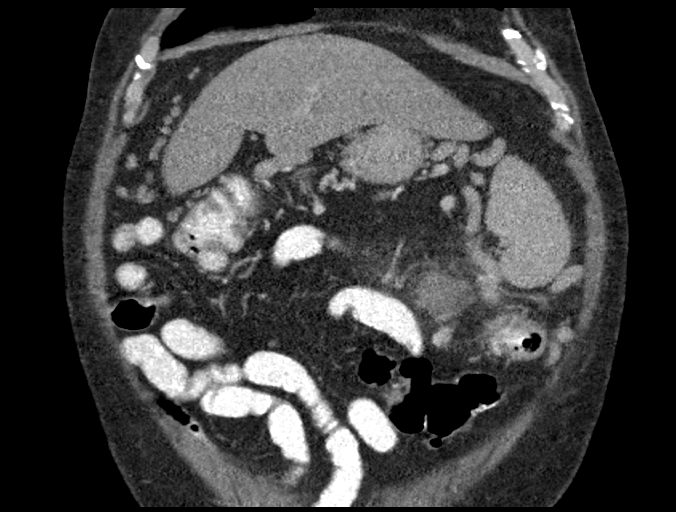
[im 37/113  soft-tissue]
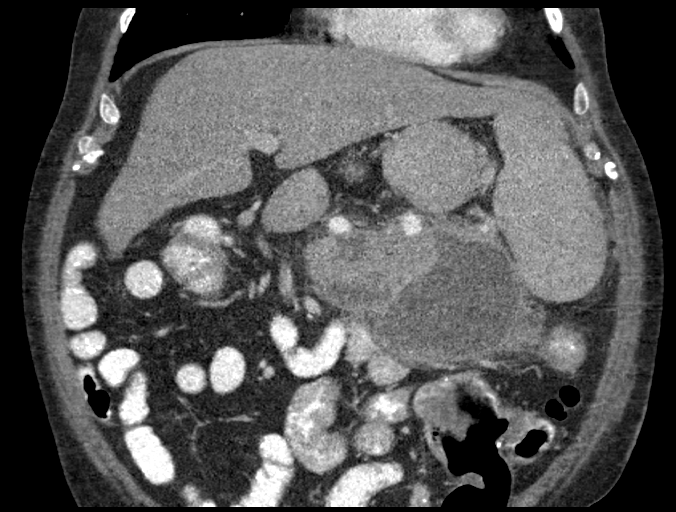
[im 50/113  soft-tissue]
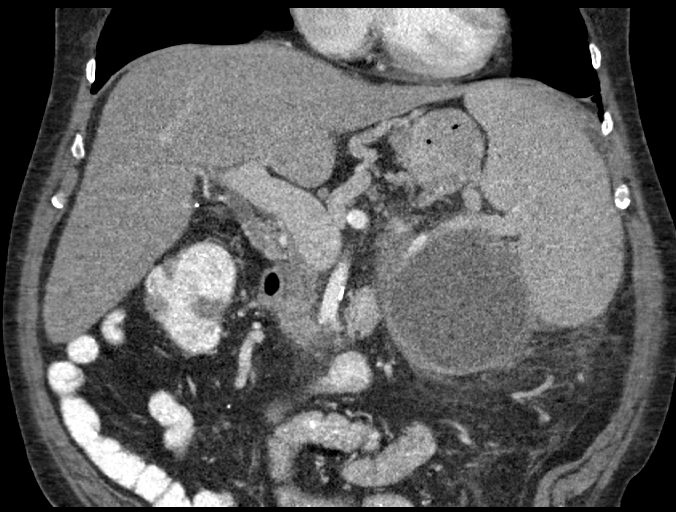
[im 63/113  soft-tissue]
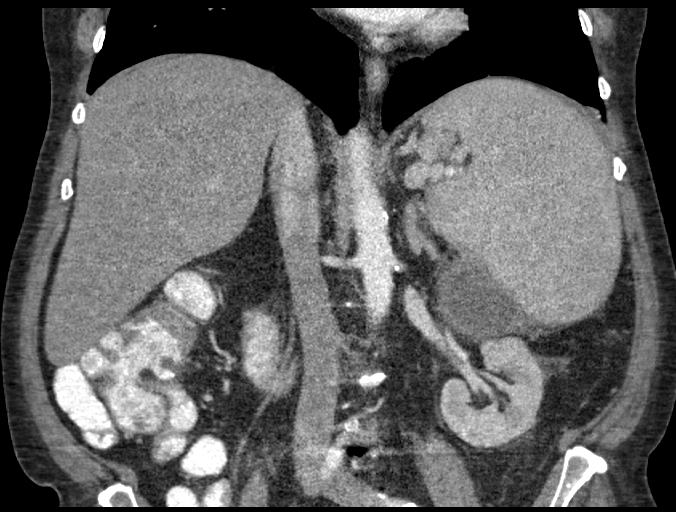
[im 76/113  soft-tissue]
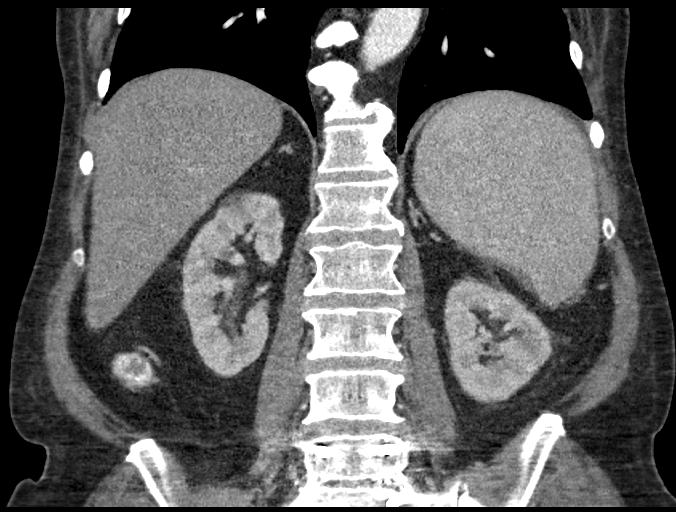
[im 88/113  soft-tissue]
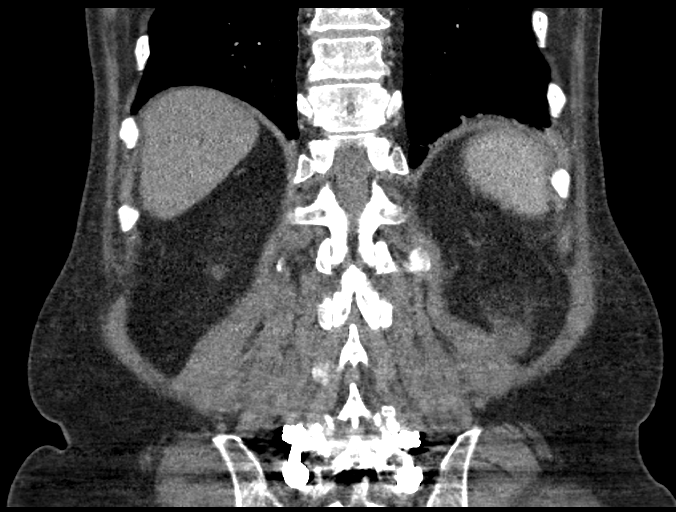
[im 102/113  soft-tissue]
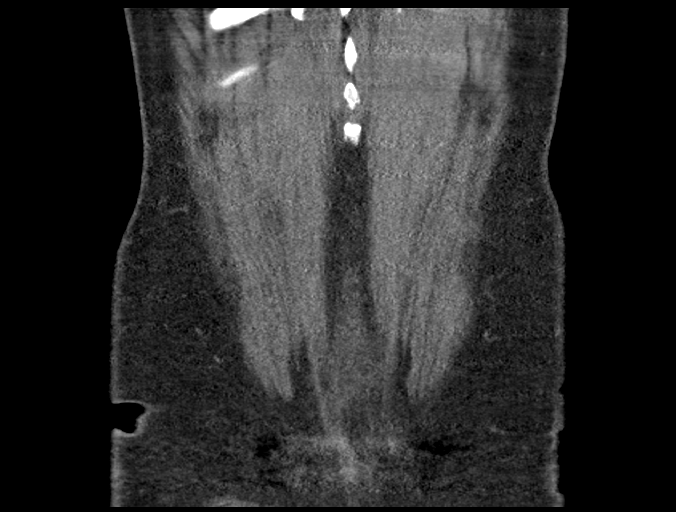

[11 of 40 positions shown; findings below may reference images not displayed]

FINDINGS: Lower chest: Lung bases are clear.

Hepatobiliary: No focal hepatic lesion. No biliary duct dilatation.
Gallbladder is normal. Common bile duct is normal.

Pancreas: Large organized fluid collection of the tail the pancreas
measures 12.5 x 9.0 cm compared to 12.0 by 8.7 cm on prior for no
significant oval change. Fluid collection extends to the splenic
hilum. The pancreatic parenchyma in the body of the pancreas is
poorly defined but does enhance. Head of the pancreas appears
normal. Small periampullary diverticulum. No pancreatic duct
dilatation.

No vascular complication associated with the large pseudocyst. The
splenic artery courses along the pseudocysts.

Spleen: Spleen is enlarged. New wedge-shaped peripheral perfusion
defect in the spleen measuring 1.9 x 2.8 cm likely represents a
small peripheral splenic infarction. Extensive venous
collateralization in the splenic hilum.

Adrenals/urinary tract: Adrenal glands and kidneys are normal.
Stable simple fluid sent attenuation cysts within kidneys. Ureters
proximally are normal.

Stomach/Bowel: Stomach is normal. Duodenum and proximal small bowel
normal. Appendix normal. Limited view of the colon is unremarkable.

Vascular/Lymphatic: Abdominal aorta normal caliber. Common origin of
the celiac and SMA extensive venous collaterals in the ventral
peritoneal space recanalization umbilical vein.

Other: No free fluid.

Musculoskeletal: No aggressive osseous lesion.
IMPRESSION: 1. Stable large pseudocyst in the tail of the pancreas. No interval
change.
2. Portal hypertension with extensive venous collateralization.
3. New small splenic infarction.

## 2020-09-13 ENCOUNTER — Other Ambulatory Visit: Payer: Self-pay | Admitting: Gastroenterology

## 2020-10-02 IMAGING — CT CT IMAGE GUIDED DRAINAGE BY PERCUTANEOUS CATHETER
1 of 2 series · 15 of 32 positions shown, 19 images · non-contrast
Comparison: none

CLINICAL DATA: History of pancreatitis. Recurrent symptomatic
abdominal pseudocyst. Drain catheter requested.

[Series 2: i-spiral 5.0 b40f · axial · 0.95mm/px · z∈[+1137,+1364]mm · 15 of 71 slices shown, 19 images]
[im 3/71  soft-tissue]
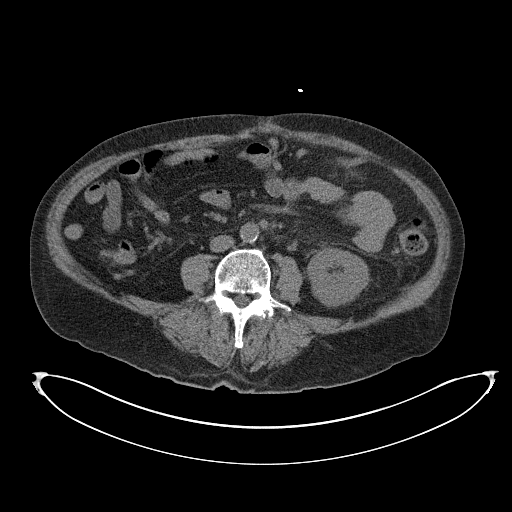
[im 3/71  bone]
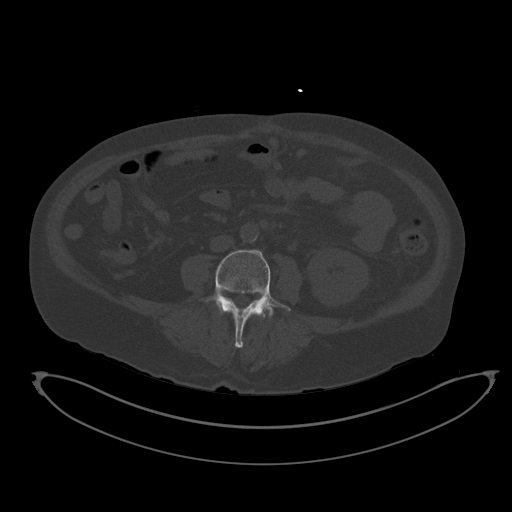
[im 9/71  soft-tissue]
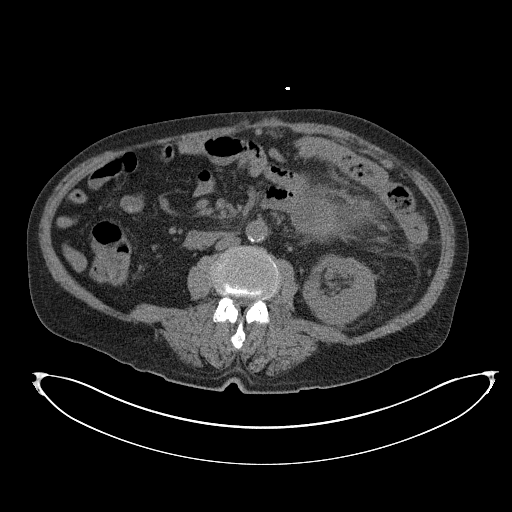
[im 15/71  soft-tissue]
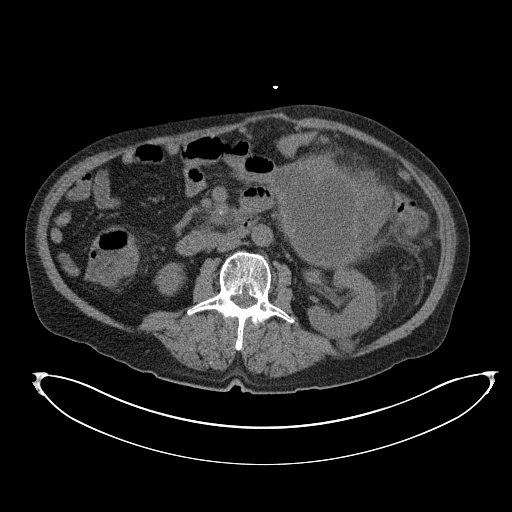
[im 21/71  soft-tissue]
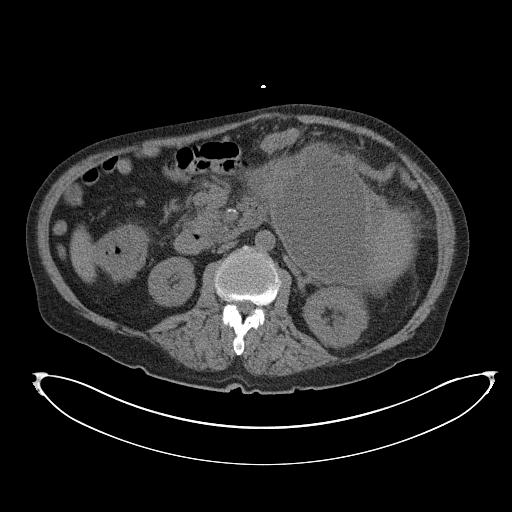
[im 24/71  soft-tissue]
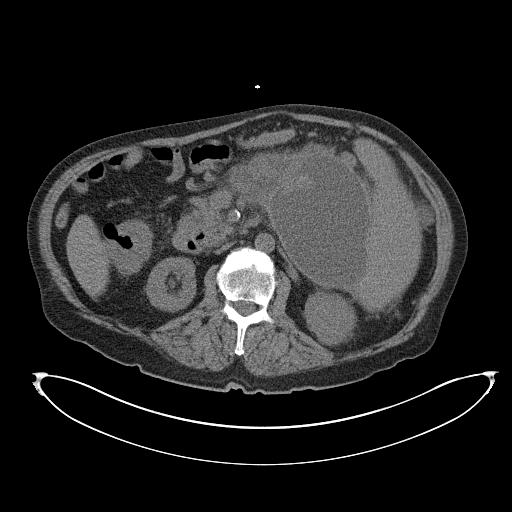
[im 30/71  soft-tissue]
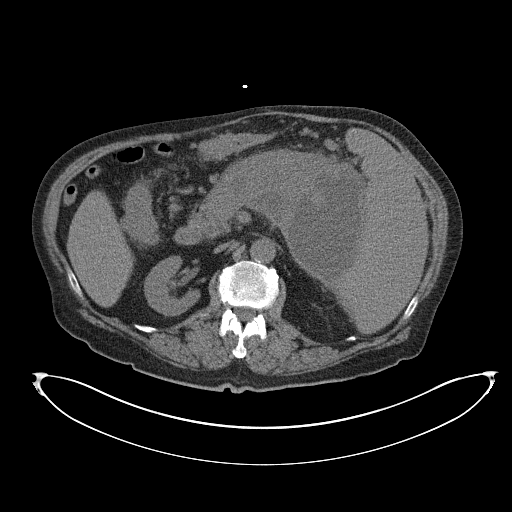
[im 36/71  soft-tissue]
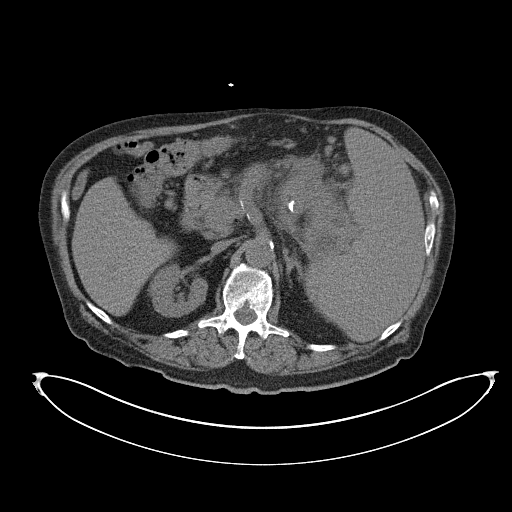
[im 41/71  soft-tissue]
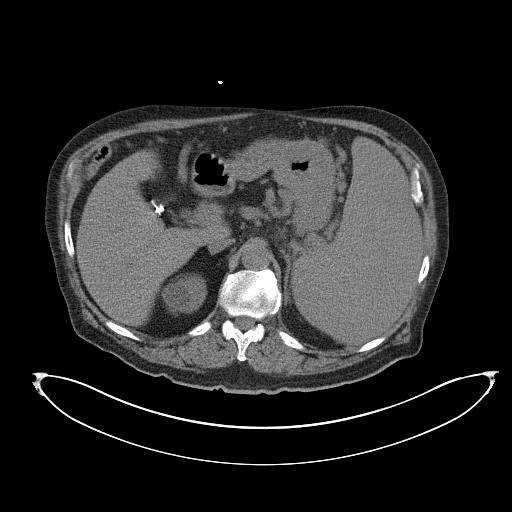
[im 47/71  soft-tissue]
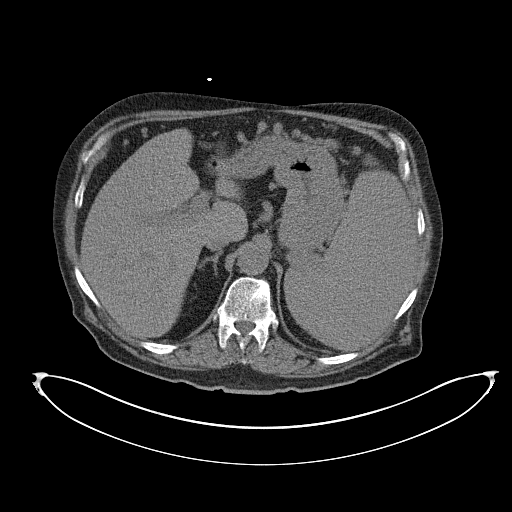
[im 47/71  bone]
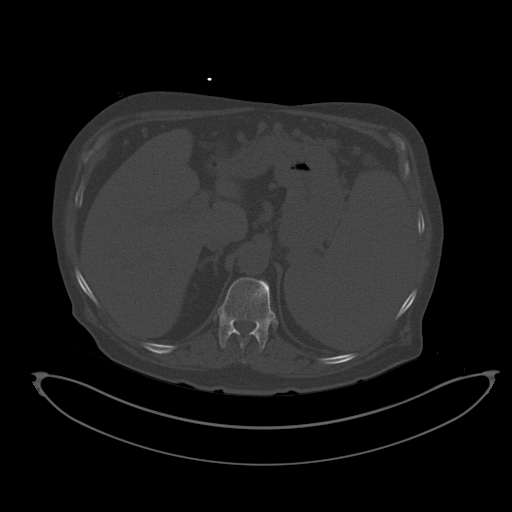
[im 50/71  soft-tissue]
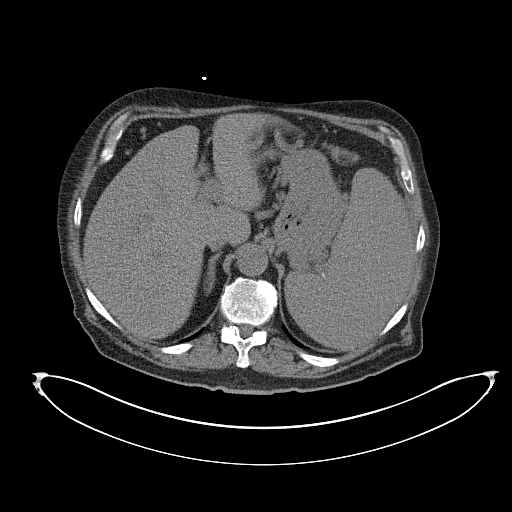
[im 56/71  soft-tissue]
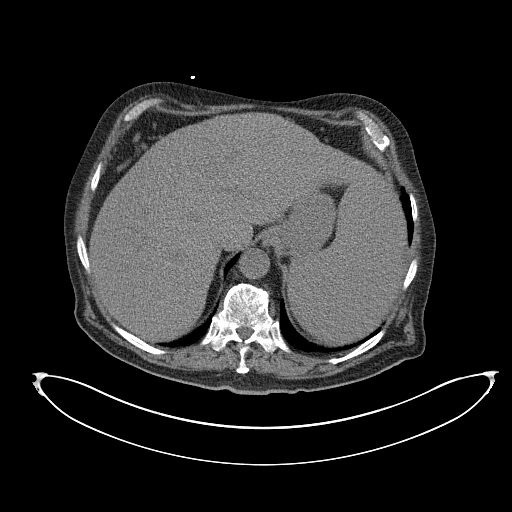
[im 59/71  lung]
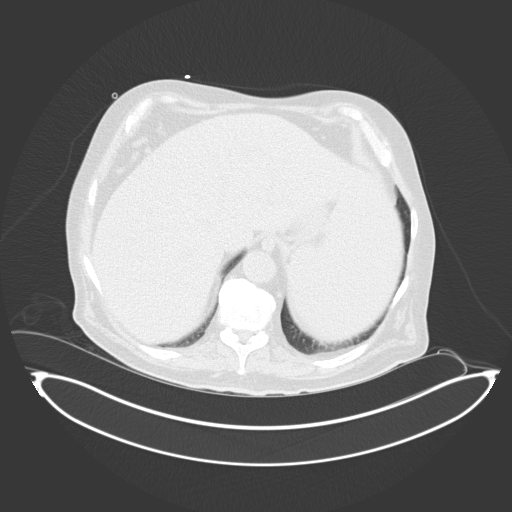
[im 62/71  soft-tissue]
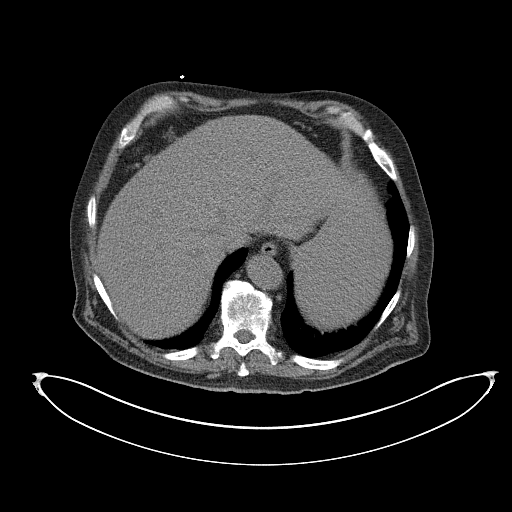
[im 62/71  lung]
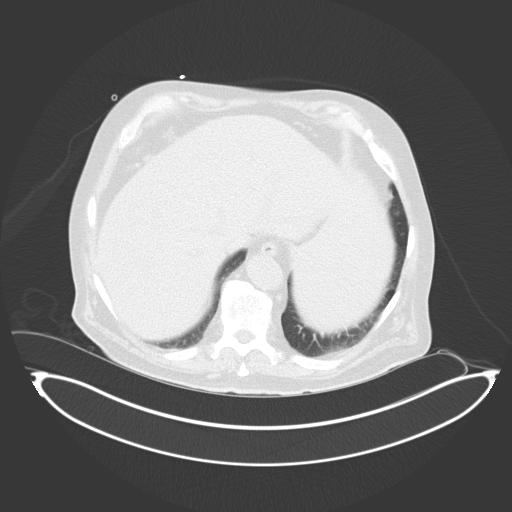
[im 65/71  lung]
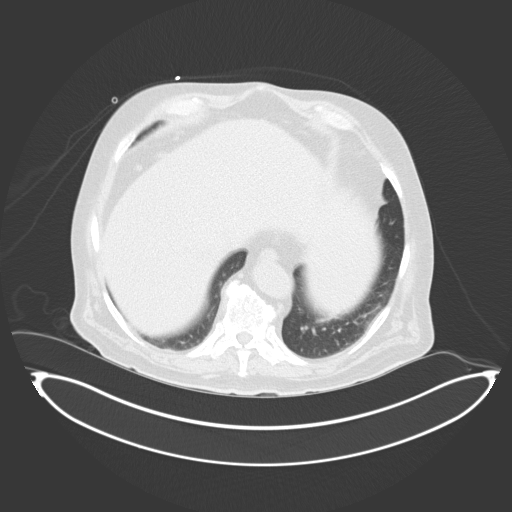
[im 68/71  soft-tissue]
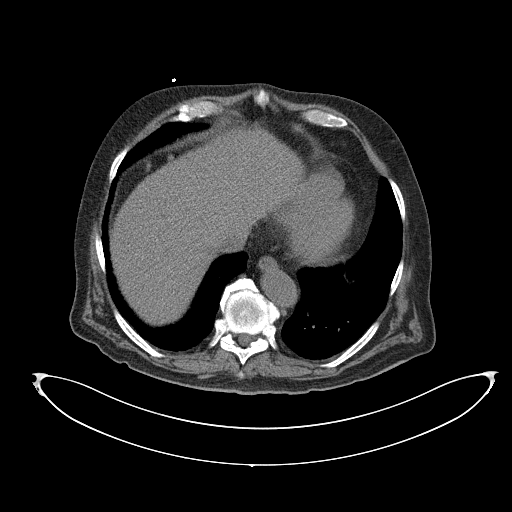
[im 68/71  lung]
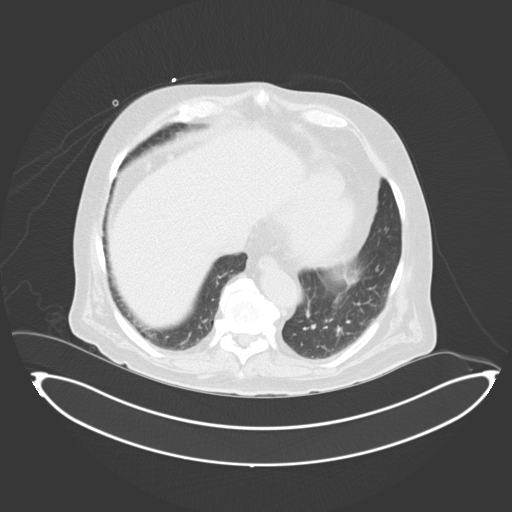

[15 of 32 positions shown; findings below may reference images not displayed]

EXAM:
CT GUIDED DRAINAGE OF PANCREATIC PSEUDOCYST

ANESTHESIA/SEDATION:
Intravenous Fentanyl 53mcg and Versed 1.5mg were administered as
conscious sedation during continuous monitoring of the patient's
level of consciousness and physiological / cardiorespiratory status
by the radiology RN, with a total moderate sedation time of 18
minutes.

PROCEDURE:
The procedure, risks, benefits, and alternatives were explained to
the patient. Questions regarding the procedure were encouraged and
answered. The patient understands and consents to the procedure.

Select axial scans through the upper abdomen obtained. An
appropriate skin entry site was determined and marked.

The operative field was prepped with chlorhexidinein a sterile
fashion, and a sterile drape was applied covering the operative
field. A sterile gown and sterile gloves were used for the
procedure. Local anesthesia was provided with 1% Lidocaine.

Under CT fluoroscopic guidance, 18 gauge trocar needle advanced into
the collection from a retroperitoneal approach. Opaque green gray
fluid could be aspirated. An Amplatz wire advanced easily within the
collection, its position confirmed on CT. Tract dilated to
facilitate placement of a 12 French pigtail drain catheter, formed
centrally within the dominant collection. 30 mL of gray Ash opaque
fluid were aspirated, sent for Gram stain and culture. The catheter
was secured externally with 0 Prolene suture and StatLock and placed
to gravity drain bag. The patient tolerated the procedure well.

COMPLICATIONS:
None immediate
FINDINGS: Loculated left mid abdominal fluid collections consistent with
pancreatic pseudocysts were localized. 12 French drain catheter
placed in the dominant component from a retroperitoneal approach.
IMPRESSION: 1. Technically successful CT-guided pancreatic pseudocyst drain
catheter placement.

## 2020-11-03 DIAGNOSIS — N6001 Solitary cyst of right breast: Secondary | ICD-10-CM

## 2020-11-03 HISTORY — DX: Solitary cyst of right breast: N60.01

## 2020-11-05 ENCOUNTER — Ambulatory Visit (INDEPENDENT_AMBULATORY_CARE_PROVIDER_SITE_OTHER): Payer: Medicare HMO | Admitting: Internal Medicine

## 2020-11-05 ENCOUNTER — Encounter: Payer: Self-pay | Admitting: *Deleted

## 2020-11-05 ENCOUNTER — Other Ambulatory Visit: Payer: Self-pay

## 2020-11-05 VITALS — BP 132/84 | HR 67 | Ht 72.0 in | Wt 243.2 lb

## 2020-11-05 DIAGNOSIS — I10 Essential (primary) hypertension: Secondary | ICD-10-CM

## 2020-11-05 DIAGNOSIS — I480 Paroxysmal atrial fibrillation: Secondary | ICD-10-CM

## 2020-11-05 DIAGNOSIS — I2510 Atherosclerotic heart disease of native coronary artery without angina pectoris: Secondary | ICD-10-CM | POA: Diagnosis not present

## 2020-11-05 NOTE — Patient Instructions (Addendum)
Medication Instructions:  Your physician recommends that you continue on your current medications as directed. Please refer to the Current Medication list given to you today. *If you need a refill on your cardiac medications before your next appointment, please call your pharmacy*  Lab Work: None. If you have labs (blood work) drawn today and your tests are completely normal, you will receive your results only by: [place] (if you have MyChart) OR A paper copy in the mail If you have any lab test that is abnormal or we need to change your treatment, we will call you to review the results.  Testing/Procedures: Your physician has requested that you have a lexiscan myoview. For further information please visit [email or web address]. Please follow instruction sheet, as given.   Follow-Up: At [facility], you and your health needs are our priority.  As part of our continuing mission to provide you with exceptional heart care, we have created designated Provider Care Teams.  These Care Teams include your primary Cardiologist (physician) and Advanced Practice Providers (APPs -  Physician Assistants and Nurse Practitioners) who all work together to provide you with the care you need, when you need it.  Your physician wants you to follow-up in: 6 months with   one of the following Advanced Practice Providers on your designated Care Team:    Tommye Standard, [place]   We recommend signing up for the patient portal called "MyChart".  Sign up information is provided on this After Visit Summary.  MyChart is used to connect with patients for Virtual Visits (Telemedicine).  Patients are able to view lab/test results, encounter notes, upcoming appointments, etc.  Non-urgent messages can be sent to your provider as well.   To learn more about what you can do with MyChart, go to [email or web address].    Any Other Special Instructions Will Be Listed Below (If Applicable).

## 2020-11-05 NOTE — Progress Notes (Signed)
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  Since last being seen in our clinic, the patient reports doing very well.  Afib is controlled.  He has rare exertional chest pain. Today, he denies symptoms of palpitations, shortness of breath,  lower extremity edema, dizziness, presyncope, or syncope.  The patient is otherwise without complaint today.   Past Medical History:  Diagnosis Date   Anemia    Arthritis    Asthma    CAD (coronary artery disease)    a. S/P PCI mid LAD (vision stent) 08/31/2004;  b. repeat cath 6/11 - no progression of CAD. c. Cath 10/2012 given moderate CAD on cardiac CT - no change from prior cath.   Chest pain 12/16/2016   COPD (chronic obstructive pulmonary disease) ([place])    COVID-19 virus infection    Diverticulitis    DJD (degenerative joint disease) of lumbar spine    Dyslipidemia    Gallstones    GERD (gastroesophageal reflux disease)    History of kidney stones    Hypertension    Hyperthyroidism    Melanoma ([place])    melanoma removed from neck    On home oxygen therapy    "2L q hs" (11/08/2012),  06/04/19 pt. not on oxygen   OSA (obstructive sleep apnea)    a. cpap noncompliance- patient reports on 12/10/14- does not use CPAP   Palpitations    a. 10/2012 - s/p LINQ loop recorder to assess for arrhythmia.    Pancreatitis    Paroxysmal A-fib ([place])    a. S/P PVI 2/11 and 9/11   PICC (peripherally inserted central catheter) in place    Pneumonia    SVT (supraventricular tachycardia) ([place])    a. Atypical AVNRT of the slow pathway   Type II diabetes mellitus [facility])    Past Surgical History:  Procedure Laterality Date   ATRIAL ABLATION SURGERY  2007   S/P slow pathway ablation for atypical AVNRT    BIOPSY  07/17/2018   Procedure: BIOPSY;  Surgeon: Irving Copas., MD;  Location: [facility] ENDOSCOPY;  Service: Gastroenterology;;   CHOLECYSTECTOMY  ~ 2008    COLON RESECTION  02/2013    COLONOSCOPY  12/25/2007   Colonic polyp status post polypectomy. Internal hemorrhoids.    CORONARY ANGIOPLASTY WITH STENT PLACEMENT  08/30/2004   Vision; to LAD    CYSTOSCOPY W/ STONE MANIPULATION  ~ 1998   "once" (11/08/2012)   EP Study  2008   Negative EP study in [place]   ESOPHAGOGASTRODUODENOSCOPY (EGD) WITH PROPOFOL N/A 07/17/2018   Procedure: ESOPHAGOGASTRODUODENOSCOPY (EGD) WITH PROPOFOL;  Surgeon: Irving Copas., MD;  Location: [place];  Service: Gastroenterology;  Laterality: N/A;   FRACTURE SURGERY     LEFT HEART CATHETERIZATION WITH CORONARY ANGIOGRAM N/A 11/10/2012   Procedure: LEFT HEART CATHETERIZATION WITH CORONARY ANGIOGRAM;  Surgeon: Burnell Blanks, MD;  Location: [facility] CATH LAB;  Service: Cardiovascular;  Laterality: N/A;   LOOP RECORDER IMPLANT N/A 11/10/2012   Medtronic LinQ implanted by Dr Rayann Heman for afib management   LUMBAR [place]  2000; 2002   POSTERIOR LUMBAR FUSION  2004   PROSTATE SURGERY  ~ 2009   PVI/ CTI ablation  02/25/2009 and 09/2009   S/P afib/ CTI ablation   SPLENECTOMY, TOTAL N/A 06/07/2019   Procedure: SPLENECTOMY;  Surgeon: Stark Klein, MD;  Location: [place];  Service: General;  Laterality: N/A;  TIBIA FRACTURE SURGERY Right 1990   "broke in 3 places; had rod put in" (11/08/2012)   [place] Right ~ [place] Right 12/18/2014   Procedure: RIGHT TOTAL HIP ARTHROPLASTY ANTERIOR APPROACH;  Surgeon: Gaynelle Arabian, MD;  Location: [facility] ORS;  Service: Orthopedics;  Laterality: Right;   UPPER ESOPHAGEAL ENDOSCOPIC ULTRASOUND (EUS) N/A 07/17/2018   Procedure: UPPER ESOPHAGEAL ENDOSCOPIC ULTRASOUND (EUS);  Surgeon: Irving Copas., MD;  Location: [place];  Service: Gastroenterology;  Laterality: N/A;    ROS- all systems are reviewed and negatives except as per HPI above  Current Outpatient Medications  Medication Sig Dispense Refill    acetaminophen (TYLENOL) 500 MG tablet Take 1,000 mg by mouth every 6 (six) hours as needed for mild pain, fever or headache.     apixaban (ELIQUIS) 5 MG TABS tablet Take 1 tablet by mouth twice daily 60 tablet 5   Cyanocobalamin (VITAMIN B-12 IJ) Inject 1,000 mcg as directed every 30 (thirty) days.     docusate sodium (COLACE) 100 MG capsule Take 1 capsule (100 mg total) by mouth 2 (two) times daily as needed for mild constipation. 10 capsule 0   escitalopram (LEXAPRO) 10 MG tablet Take 10 mg by mouth daily.  1   fenofibrate 160 MG tablet Take 160 mg by mouth daily.     ferrous sulfate 325 (65 FE) MG tablet Take 325 mg by mouth See admin instructions. Monday Wednesday Friday     gabapentin (NEURONTIN) 300 MG capsule Take 1 capsule (300 mg total) by mouth 3 (three) times daily. 90 capsule 0   glimepiride (AMARYL) 1 MG tablet Take 1 mg by mouth 2 (two) times daily.     Insulin Pen Needle 29G X 5MM MISC 1 Device by Does not apply route 3 (three) times daily as needed (for use with insulin pen). 100 each 0   JANUVIA 50 MG tablet Take 50 mg by mouth daily.     LANTUS SOLOSTAR 100 UNIT/ML Solostar Pen Inject 24 Units into the skin at bedtime.      losartan (COZAAR) 25 MG tablet Take 0.5 tablets (12.5 mg total) by mouth daily. 45 tablet 1   metFORMIN (GLUCOPHAGE) 500 MG tablet Take 500 mg by mouth 2 (two) times daily.     metoprolol tartrate (LOPRESSOR) 50 MG tablet Take 0.5 tablets (25 mg total) by mouth 2 (two) times daily. 90 tablet 1   oxyCODONE-acetaminophen (PERCOCET) 10-325 MG tablet Take 1 tablet by mouth every 4 (four) hours.     pantoprazole (PROTONIX) 40 MG tablet Take 1 tablet by mouth twice daily 60 tablet 2   Tamsulosin HCl (FLOMAX) 0.4 MG CAPS Take 0.4 mg by mouth daily.     traZODone (DESYREL) 50 MG tablet Take 100 mg by mouth at bedtime.      No current facility-administered medications for this visit.    Physical Exam: Vitals:   11/05/20 0936  BP: 132/84  Pulse: 67  SpO2: 90%   Weight: 243 lb 3.2 oz (110.3 kg)  Height: 6' (1.829 m)    GEN- The patient is well appearing, alert and oriented x 3 today.   Head- normocephalic, atraumatic Eyes-  Sclera clear, conjunctiva pink Ears- hearing intact Oropharynx- clear Lungs- Clear to ausculation bilaterally, normal work of breathing Heart- Regular rate and rhythm, no murmurs, rubs or gallops, PMI not laterally displaced GI- soft, NT, ND, + BS Extremities- no clubbing, cyanosis, or edema  Wt Readings  from Last 3 Encounters:  11/05/20 243 lb 3.2 oz (110.3 kg)  06/24/20 246 lb (111.6 kg)  05/12/20 242 lb (109.8 kg)    EKG tracing ordered today is personally reviewed and shows sinus  Assessment and Plan:  Paroxysmal atrial fibrillation Doing well s/p ablation 2011 Chads2vasc score is 4.  He is on eliquis  2. HTN Stable No change required today  3. CAD Stable angina S/p PCI of the mid LAD Recent cardiac CT reviewed.  Stress testing was advised and ordered but not yet completed.  He will proceed at this time.  4. Ascending aortic aneurysm  refer to CV surgery  Risks, benefits and potential toxicities for medications prescribed and/or refilled reviewed with patient today.   Return to see EP AP in 6 months.  Could follow-up with Dr Curt Bears in [place] subsequently  Thompson Grayer MD, [facility] 11/05/2020 10:14 AM

## 2020-11-07 ENCOUNTER — Other Ambulatory Visit: Payer: Self-pay | Admitting: Physician Assistant

## 2020-11-07 DIAGNOSIS — R0602 Shortness of breath: Secondary | ICD-10-CM

## 2020-11-13 ENCOUNTER — Institutional Professional Consult (permissible substitution) (INDEPENDENT_AMBULATORY_CARE_PROVIDER_SITE_OTHER): Payer: Medicare HMO | Admitting: Physician Assistant

## 2020-11-13 ENCOUNTER — Other Ambulatory Visit: Payer: Self-pay

## 2020-11-13 ENCOUNTER — Encounter: Payer: Self-pay | Admitting: Physician Assistant

## 2020-11-13 DIAGNOSIS — I712 Thoracic aortic aneurysm, without rupture, unspecified: Secondary | ICD-10-CM

## 2020-11-13 HISTORY — DX: Thoracic aortic aneurysm, without rupture, unspecified: I71.20
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
## 2020-11-16 ENCOUNTER — Other Ambulatory Visit: Payer: Self-pay | Admitting: Physician Assistant

## 2020-11-18 ENCOUNTER — Telehealth (HOSPITAL_COMMUNITY): Payer: Self-pay | Admitting: *Deleted

## 2020-11-18 NOTE — Telephone Encounter (Signed)
Left message on voicemail per DPR in reference to upcoming appointment scheduled on 11/21/20 at 7:15 with detailed instructions given per Myocardial Perfusion Study Information Sheet for the test. LM to arrive 15 minutes early, and that it is imperative to arrive on time for appointment to keep from having the test rescheduled. If you need to cancel or reschedule your appointment, please call the office within 24 hours of your appointment. Failure to do so may result in a cancellation of your appointment, and a $50 no show fee. Phone number given for call back for any questions.

## 2020-11-21 ENCOUNTER — Other Ambulatory Visit: Payer: Self-pay

## 2020-11-21 ENCOUNTER — Ambulatory Visit (HOSPITAL_COMMUNITY): Payer: Medicare HMO | Attending: Internal Medicine

## 2020-11-21 DIAGNOSIS — I77810 Thoracic aortic ectasia: Secondary | ICD-10-CM | POA: Insufficient documentation

## 2020-11-21 DIAGNOSIS — I2510 Atherosclerotic heart disease of native coronary artery without angina pectoris: Secondary | ICD-10-CM | POA: Insufficient documentation

## 2020-11-21 LAB — MYOCARDIAL PERFUSION IMAGING
Base ST Depression (mm): 0 mm
LV dias vol: 90 mL (ref 62–150)
LV sys vol: 34 mL
Nuc Stress EF: 63 %
Peak HR: 112 {beats}/min
Rest HR: 74 {beats}/min
Rest Nuclear Isotope Dose: 10.1 mCi
SDS: 4
SRS: 1
SSS: 4
ST Depression (mm): 0 mm
Stress Nuclear Isotope Dose: 32.5 mCi
TID: 0.91

## 2020-11-21 MED ORDER — TECHNETIUM TC 99M TETROFOSMIN IV KIT
10.1000 | PACK | Freq: Once | INTRAVENOUS | Status: AC | PRN
Start: 2020-11-21 — End: 2020-11-21
  Administered 2020-11-21: 10.1 via INTRAVENOUS
  Filled 2020-11-21: qty 11

## 2020-11-21 MED ORDER — REGADENOSON 0.4 MG/5ML IV SOLN
0.4000 mg | Freq: Once | INTRAVENOUS | Status: AC
  Administered 2020-11-21: 0.4 mg via INTRAVENOUS

## 2020-11-21 MED ORDER — TECHNETIUM TC 99M TETROFOSMIN IV KIT
32.5000 | PACK | Freq: Once | INTRAVENOUS | Status: AC | PRN
  Administered 2020-11-21: 32.5 via INTRAVENOUS
  Filled 2020-11-21: qty 33

## 2020-12-06 ENCOUNTER — Other Ambulatory Visit: Payer: Self-pay | Admitting: Physician Assistant

## 2020-12-06 ENCOUNTER — Other Ambulatory Visit: Payer: Self-pay | Admitting: Gastroenterology

## 2021-01-24 ENCOUNTER — Other Ambulatory Visit: Payer: Self-pay | Admitting: Internal Medicine

## 2021-01-27 NOTE — Telephone Encounter (Signed)
Pt last saw Dr Rayann Heman 11/05/20, last labs 09/25/20 Creat 1.31, age 71, weight 110.2kg, based on specified criteria pt is on appropriate dosage of Eliquis 58m BID for afib.  Will refill rx.

## 2021-02-14 ENCOUNTER — Other Ambulatory Visit: Payer: Self-pay | Admitting: Physician Assistant

## 2021-03-06 ENCOUNTER — Other Ambulatory Visit: Payer: Self-pay | Admitting: *Deleted

## 2021-03-06 MED ORDER — LOSARTAN POTASSIUM 25 MG PO TABS: ORAL_TABLET | ORAL | 0 refills | Status: DC

## 2021-03-14 ENCOUNTER — Other Ambulatory Visit: Payer: Self-pay | Admitting: Gastroenterology

## 2021-04-28 NOTE — Progress Notes (Signed)
? ?PCP:  [name], MD ?Primary Cardiologist: Thompson Grayer, MD ?Electrophysiologist: Thompson Grayer, MD  ? ?Steve Anthony is a 71 y.o. male seen today for Thompson Grayer, MD for routine electrophysiology followup.  Since last being seen in our clinic the patient reports doing very well. He has rare, brief palpitations that are not significant or limiting.  he denies chest pain, dyspnea, PND, orthopnea, nausea, vomiting, dizziness, syncope, edema, weight gain, or early satiety. ? ?Past Medical History:  ?Diagnosis Date  ? Anemia   ? Arthritis   ? Asthma   ? CAD (coronary artery disease)   ? a. S/P PCI mid LAD (vision stent) 08/31/2004;  b. repeat cath 6/11 - no progression of CAD. c. Cath 10/2012 given moderate CAD on cardiac CT - no change from prior cath.  ? Chest pain 12/16/2016  ? COPD (chronic obstructive pulmonary disease) ([place])   ? COVID-19 virus infection   ? Diverticulitis   ? DJD (degenerative joint disease) of lumbar spine   ? Dyslipidemia   ? Gallstones   ? GERD (gastroesophageal reflux disease)   ? History of kidney stones   ? Hypertension   ? Hyperthyroidism   ? Melanoma ([place])   ? melanoma removed from neck   ? On home oxygen therapy   ? "2L q hs" (11/08/2012),  06/04/19 pt. not on oxygen  ? OSA (obstructive sleep apnea)   ? a. cpap noncompliance- patient reports on 12/10/14- does not use CPAP  ? Palpitations   ? a. 10/2012 - s/p LINQ loop recorder to assess for arrhythmia.   ? Pancreatitis   ? Paroxysmal A-fib ([place])   ? a. S/P PVI 2/11 and 9/11  ? PICC (peripherally inserted central catheter) in place   ? Pneumonia   ? SVT (supraventricular tachycardia) ([place])   ? a. Atypical AVNRT of the slow pathway  ? Type II diabetes mellitus ([place])   ? ?Past Surgical History:  ?Procedure Laterality Date  ? ATRIAL ABLATION SURGERY  2007  ? S/P slow pathway ablation for atypical AVNRT   ? BIOPSY  07/17/2018  ? Procedure: BIOPSY;  Surgeon: Irving Copas., MD;  Location: [place];  Service:  Gastroenterology;;  ? CHOLECYSTECTOMY  ~ 2008  ? COLON RESECTION  02/2013   ? COLONOSCOPY  12/25/2007  ? Colonic polyp status post polypectomy. Internal hemorrhoids.   ? CORONARY ANGIOPLASTY WITH STENT PLACEMENT  08/30/2004  ? Vision; to LAD   ? CYSTOSCOPY W/ STONE MANIPULATION  ~ 1998  ? "once" (11/08/2012)  ? EP Study  2008  ? Negative EP study in [place]  ? ESOPHAGOGASTRODUODENOSCOPY (EGD) WITH PROPOFOL N/A 07/17/2018  ? Procedure: ESOPHAGOGASTRODUODENOSCOPY (EGD) WITH PROPOFOL;  Surgeon: Rush Landmark Telford Nab., MD;  Location: [place];  Service: Gastroenterology;  Laterality: N/A;  ? FRACTURE SURGERY    ? LEFT HEART CATHETERIZATION WITH CORONARY ANGIOGRAM N/A 11/10/2012  ? Procedure: LEFT HEART CATHETERIZATION WITH CORONARY ANGIOGRAM;  Surgeon: Burnell Blanks, MD;  Location: [facility] CATH LAB;  Service: Cardiovascular;  Laterality: N/A;  ? LOOP RECORDER IMPLANT N/A 11/10/2012  ? Medtronic LinQ implanted by Dr Rayann Heman for afib management  ? [place] SURGERY  2000; 2002  ? POSTERIOR LUMBAR FUSION  2004  ? PROSTATE SURGERY  ~ 2009  ? PVI/ CTI ablation  02/25/2009 and 09/2009  ? S/P afib/ CTI ablation  ? SPLENECTOMY, TOTAL N/A 06/07/2019  ? Procedure: SPLENECTOMY;  Surgeon: Stark Klein, MD;  Location: [place];  Service: General;  Laterality: N/A;  ?  [place]IBIA FRACTURE SURGERY Right 1990  ? "broke in 3 places; had rod put in" (11/08/2012)  ? TIBIA HARDWARE REMOVAL Right ~ 1991  ? TONSILLECTOMY  1965  ? TOTAL HIP ARTHROPLASTY Right 12/18/2014  ? Procedure: RIGHT TOTAL HIP ARTHROPLASTY ANTERIOR APPROACH;  Surgeon: Gaynelle Arabian, MD;  [facility];  Service: Orthopedics;  Laterality: Right;  ? UPPER ESOPHAGEAL ENDOSCOPIC ULTRASOUND (EUS) N/A 07/17/2018  ? Procedure: UPPER ESOPHAGEAL ENDOSCOPIC ULTRASOUND (EUS);  Surgeon: Irving Copas., MD;  Location: [place];  Service: Gastroenterology;  Laterality: N/A;  ? ? ?Current Outpatient Medications  ?Medication Sig Dispense Refill  ? acetaminophen (TYLENOL)  500 MG tablet Take 1,000 mg by mouth every 6 (six) hours as needed for mild pain, fever or headache.    ? apixaban (ELIQUIS) 5 MG TABS tablet Take 1 tablet by mouth twice daily 60 tablet 6  ? Cyanocobalamin (VITAMIN B-12 IJ) Inject 1,000 mcg as directed every 30 (thirty) days.    ? docusate sodium (COLACE) 100 MG capsule Take 1 capsule (100 mg total) by mouth 2 (two) times daily as needed for mild constipation. 10 capsule 0  ? escitalopram (LEXAPRO) 10 MG tablet Take 10 mg by mouth daily.  1  ? fenofibrate 160 MG tablet Take 160 mg by mouth daily.    ? ferrous sulfate 325 (65 FE) MG tablet Take 325 mg by mouth See admin instructions. Monday Wednesday Friday    ? gabapentin (NEURONTIN) 300 MG capsule Take 1 capsule (300 mg total) by mouth 3 (three) times daily. 90 capsule 0  ? glimepiride (AMARYL) 1 MG tablet Take 1 mg by mouth 2 (two) times daily.    ? Insulin Glargine (BASAGLAR KWIKPEN) 100 UNIT/ML Inject into the skin daily.    ? Insulin Pen Needle 29G X 5MM MISC 1 Device by Does not apply route 3 (three) times daily as needed (for use with insulin pen). 100 each 0  ? JANUVIA 50 MG tablet Take 50 mg by mouth daily.    ? losartan (COZAAR) 25 MG tablet Take 1/2 (one-half) tablet by mouth once daily 45 tablet 0  ? metFORMIN (GLUCOPHAGE) 500 MG tablet Take 500 mg by mouth 2 (two) times daily.    ? metoprolol tartrate (LOPRESSOR) 50 MG tablet Take 1 tablet by mouth once daily 90 tablet 2  ? oxyCODONE-acetaminophen (PERCOCET) 10-325 MG tablet Take 1 tablet by mouth every 4 (four) hours.    ? pantoprazole (PROTONIX) 40 MG tablet TAKE 1 TABLET BY MOUTH TWICE(2) DAILY 180 tablet 0  ? Tamsulosin HCl (FLOMAX) 0.4 MG CAPS Take 0.4 mg by mouth daily.    ? traZODone (DESYREL) 50 MG tablet Take 100 mg by mouth at bedtime.     ? ?No current facility-administered medications for this visit.  ? ? ?Allergies  ?Allergen Reactions  ? Penicillins Hives and Shortness Of Breath  ?  Did it involve swelling of the face/tongue/throat, SOB,  or low BP? Yes ?Did it involve sudden or severe rash/hives, skin peeling, or any reaction on the inside of your mouth or nose? No ?Did you need to seek medical attention at a hospital or doctor's office? Yes (emergency room) ?When did it last happen?  1974 ?If all above answers are "NO", may proceed with cephalosporin use. ? ?  ? ? ?Social History  ? ?Socioeconomic History  ? Marital status: Married  ?  Spouse name: Not on file  ? Number of children: 1  ? Years of education: Not on file  ?  Highest education level: Not on file  ?Occupational History  ? Occupation: DISABLED  ?  Employer: DISABLED  ? Occupation: Retired [employer] Administrator  ?Tobacco Use  ? Smoking status: Former  ?  Packs/day: 3.00  ?  Years: 38.00  ?  Pack years: 114.00  ?  Types: Cigarettes  ?  Quit date: 03/25/2004  ?  Years since quitting: 17.1  ? Smokeless tobacco: Former  ?  Types: Chew  ?  Quit date: 04/25/2004  ?Vaping Use  ? Vaping Use: Never used  ?Substance and Sexual Activity  ? Alcohol use: Not Currently  ?  Comment:  "quit drinking in 1989"  ? Drug use: No  ? Sexual activity: Not Currently  ?Other Topics Concern  ? Not on file  ?Social History Narrative  ? Resides in [place] with his wife  ? Two children and two grandchildren  ? Attends [employer]  ? ?Social Determinants of Health  ? ?Financial Resource Strain: Not on file  ?Food Insecurity: Not on file  ?Transportation Needs: Not on file  ?Physical Activity: Not on file  ?Stress: Not on file  ?Social Connections: Not on file  ?Intimate Partner Violence: Not on file  ? ? ? ?Review of Systems: ?All other systems reviewed and are otherwise negative except as noted above. ? ?Physical Exam: ?Vitals:  ? 05/04/21 0755  ?BP: 98/60  ?Pulse: 75  ?SpO2: 92%  ?Weight: 240 lb 9.6 oz (109.1 kg)  ?Height: 5' 11"  (1.803 m)  ? ? ?GEN- The patient is well appearing, alert and oriented x 3 today.   ?HEENT: normocephalic, atraumatic; sclera clear, conjunctiva pink; hearing intact; oropharynx  clear; neck supple, no JVP ?Lymph- no cervical lymphadenopathy ?Lungs- Clear to ausculation bilaterally, normal work of breathing.  No wheezes, rales, rhonchi ?Heart- Regular rate and rhythm, no murmurs, rubs or gallops,

## 2021-05-04 ENCOUNTER — Encounter: Payer: Self-pay | Admitting: Student

## 2021-05-04 ENCOUNTER — Ambulatory Visit (INDEPENDENT_AMBULATORY_CARE_PROVIDER_SITE_OTHER): Payer: Medicare PPO | Admitting: Student

## 2021-05-04 VITALS — BP 98/60 | HR 75 | Ht 71.0 in | Wt 240.6 lb

## 2021-05-04 DIAGNOSIS — I480 Paroxysmal atrial fibrillation: Secondary | ICD-10-CM

## 2021-05-04 DIAGNOSIS — I10 Essential (primary) hypertension: Secondary | ICD-10-CM | POA: Diagnosis not present

## 2021-05-04 DIAGNOSIS — I77810 Thoracic aortic ectasia: Secondary | ICD-10-CM | POA: Diagnosis not present

## 2021-05-04 DIAGNOSIS — I2510 Atherosclerotic heart disease of native coronary artery without angina pectoris: Secondary | ICD-10-CM | POA: Diagnosis not present

## 2021-05-04 NOTE — Patient Instructions (Signed)
Medication Instructions:  ?Your physician recommends that you continue on your current medications as directed. Please refer to the Current Medication list given to you today. ? ?*If you need a refill on your cardiac medications before your next appointment, please call your pharmacy* ? ? ?Lab Work: ?None ?If you have labs (blood work) drawn today and your tests are completely normal, you will receive your results only by: ?MyChart Message (if you have MyChart) OR ?A paper copy in the mail ?If you have any lab test that is abnormal or we need to change your treatment, we will call you to review the results. ? ? ?Follow-Up: ?At [facility], you and your health needs are our priority.  As part of our continuing mission to provide you with exceptional heart care, we have created designated Provider Care Teams.  These Care Teams include your primary Cardiologist (physician) and Advanced Practice Providers (APPs -  Physician Assistants and Nurse Practitioners) who all work together to provide you with the care you need, when you need it. ? ?We recommend signing up for the patient portal called "MyChart".  Sign up information is provided on this After Visit Summary.  MyChart is used to connect with patients for Virtual Visits (Telemedicine).  Patients are able to view lab/test results, encounter notes, upcoming appointments, etc.  Non-urgent messages can be sent to your provider as well.   ?To learn more about what you can do with MyChart, go to [email or web address].   ? ?Your next appointment:   ?6 month(s) ? ?The format for your next appointment:   ?In Person ? ?Provider:   ?Allegra Lai, MD  ? ?Important Information About Sugar ? ? ? ? ?  ?

## 2021-06-03 ENCOUNTER — Other Ambulatory Visit: Payer: Self-pay | Admitting: *Deleted

## 2021-06-03 DIAGNOSIS — I712 Thoracic aortic aneurysm, without rupture, unspecified: Secondary | ICD-10-CM

## 2021-06-06 ENCOUNTER — Other Ambulatory Visit: Payer: Self-pay | Admitting: Gastroenterology

## 2021-07-29 ENCOUNTER — Ambulatory Visit: Payer: Medicare PPO | Admitting: Surgery

## 2021-07-29 ENCOUNTER — Ambulatory Visit
Admission: RE | Admit: 2021-07-29 | Discharge: 2021-07-29 | Disposition: A | Discharge status: Home / Self Care | Payer: Medicare PPO | Source: Ambulatory Visit | Attending: Surgery | Admitting: Surgery

## 2021-07-29 ENCOUNTER — Other Ambulatory Visit: Payer: Self-pay | Admitting: Surgery

## 2021-07-29 ENCOUNTER — Encounter: Payer: Self-pay | Admitting: Surgery

## 2021-07-29 VITALS — BP 84/54 | HR 76 | Resp 20 | Ht 71.0 in | Wt 240.0 lb

## 2021-07-29 DIAGNOSIS — I712 Thoracic aortic aneurysm, without rupture, unspecified: Secondary | ICD-10-CM

## 2021-07-29 NOTE — Progress Notes (Addendum)
HPI:  The patient returns today for follow-up of a 4.5 cm fusiform ascending aortic aneurysm.  He is a 71 year old gentleman with history of coronary artery disease status post PCI in 2006, diabetes, hypertension, hyperlipidemia, and atrial fibrillation followed by Dr. Rayann Heman with multiple prior ablations.  He was seen initially in our office in October 2022 and was reporting some chest discomfort.  He had a cardiac CTA which showed calcified plaque in the mid left circumflex causing a 70 to 99% stenosis.  It was not possible to do FFR due to his prior LAD stent which was widely patent.  He subsequent underwent a nuclear stress test which showed no evidence of ischemia.  Left ventricular ejection fraction was 63%.  He reports that he continues to have intermittent episodes of chest discomfort and reports having an episode of atrial fibrillation with a rapid rate last night associated with some chest discomfort.  Today he has felt a little dizzy.  Current Outpatient Medications  Medication Sig Dispense Refill   acetaminophen (TYLENOL) 500 MG tablet Take 1,000 mg by mouth every 6 (six) hours as needed for mild pain, fever or headache.     apixaban (ELIQUIS) 5 MG TABS tablet Take 1 tablet by mouth twice daily 60 tablet 6   Cyanocobalamin (VITAMIN B-12 IJ) Inject 1,000 mcg as directed every 30 (thirty) days.     docusate sodium (COLACE) 100 MG capsule Take 1 capsule (100 mg total) by mouth 2 (two) times daily as needed for mild constipation. 10 capsule 0   escitalopram (LEXAPRO) 10 MG tablet Take 10 mg by mouth daily.  1   fenofibrate 160 MG tablet Take 160 mg by mouth daily.     ferrous sulfate 325 (65 FE) MG tablet Take 325 mg by mouth See admin instructions. Monday Wednesday Friday     gabapentin (NEURONTIN) 300 MG capsule Take 1 capsule (300 mg total) by mouth 3 (three) times daily. 90 capsule 0   glimepiride (AMARYL) 1 MG tablet Take 1 mg by mouth 2 (two) times daily.     Insulin Glargine  (BASAGLAR KWIKPEN) 100 UNIT/ML Inject 30 Units into the skin daily.     Insulin Pen Needle 29G X 5MM MISC 1 Device by Does not apply route 3 (three) times daily as needed (for use with insulin pen). 100 each 0   JANUVIA 50 MG tablet Take 50 mg by mouth daily.     losartan (COZAAR) 25 MG tablet Take 1/2 (one-half) tablet by mouth once daily 45 tablet 0   metFORMIN (GLUCOPHAGE) 500 MG tablet Take 500 mg by mouth 2 (two) times daily.     metoprolol tartrate (LOPRESSOR) 50 MG tablet Take 1 tablet by mouth once daily 90 tablet 2   oxyCODONE-acetaminophen (PERCOCET) 10-325 MG tablet Take 1 tablet by mouth every 4 (four) hours.     pantoprazole (PROTONIX) 40 MG tablet TAKE 1 TABLET BY MOUTH TWICE(2) DAILY 180 tablet 0   Tamsulosin HCl (FLOMAX) 0.4 MG CAPS Take 0.4 mg by mouth daily.     traZODone (DESYREL) 50 MG tablet Take 100 mg by mouth at bedtime.      No current facility-administered medications for this visit.     Physical Exam: BP (!) 84/54 (BP Location: Right Arm, Patient Position: Sitting, Cuff Size: Normal)   Pulse 76   Resp 20   Ht 5' 11"  (1.803 m)   Wt 240 lb (108.9 kg)   SpO2 91% Comment: RA  BMI 33.47 kg/m  He looks well. Cardiac exam shows regular rate and rhythm with normal heart sounds.  There is no murmur. Lungs are clear. There is no peripheral edema  Diagnostic Tests:  Narrative & Impression  CLINICAL DATA:  Ascending thoracic aortic aneurysm follow up   EXAM: CT CHEST WITHOUT CONTRAST   TECHNIQUE: Multidetector CT imaging of the chest was performed following the standard protocol without IV contrast.   RADIATION DOSE REDUCTION: This exam was performed according to the departmental dose-optimization program which includes automated exposure control, adjustment of the mA and/or kV according to patient size and/or use of iterative reconstruction technique.   COMPARISON:  07/17/2020   FINDINGS: Cardiovascular: Coronary, aortic arch, and branch  vessel atherosclerotic vascular disease.   Ascending thoracic aortic aneurysm 4.4 cm in diameter is measured on image 82 of series 2, essentially stable from 07/17/2020. Stable prominence of the main pulmonary artery.   Mediastinum/Nodes: Numerous scattered small mediastinal lymph nodes are not pathologically enlarged by size criteria, and roughly similar to 07/17/20.   Lungs/Pleura: Centrilobular emphysema. 3 mm in diameter calcified granuloma in the right lower lobe on image 127 series 4, unchanged. Stable scarring in the lingula. Scarring or atelectasis in the posterior basal segment left lower lobe.   Upper Abdomen: Cholecystectomy. Absent spleen. Fluid density right kidney upper pole cyst. By minimally complex but stable left kidney upper pole cyst is not substantially changed from 06/15/2019 and considered benign. Neither renal lesion requires any further workup. Abdominal aortic atherosclerosis. Large common origin of the SMA and celiac trunk as on prior exams.   Musculoskeletal: Thoracic spondylosis.   IMPRESSION: 1. Stable size of the ascending thoracic aortic aneurysm 4.4 cm in diameter. Recommend annual imaging followup by CTA or MRA. This recommendation follows 2010 ACCF/AHA/AATS/ACR/ASA/SCA/SCAI/SIR/STS/SVM Guidelines for the Diagnosis and Management of Patients with Thoracic Aortic Disease. Circulation. 2010; 121: I370-W888. Aortic aneurysm NOS (ICD10-I71.9) 2. Aortic Atherosclerosis (ICD10-I70.0) and Emphysema (ICD10-J43.9). Coronary atherosclerosis. 3. Stable mild prominence of the main pulmonary artery which could reflect pulmonary arterial hypertension. 4. Absent spleen.     Electronically Signed   By: Van Clines M.D.   On: 07/29/2021 11:57    Impression:  He has a stable 4.4 cm fusiform ascending aortic aneurysm.  This is well below the surgical threshold of 5.5 cm.  I reviewed the CT images with him and answered his questions.  He was not given  contrast today due to an elevated creatinine of 2.8.  The last creatinine I can find for him was on 06/18/2020 when it was 1.33.  He is hypotensive in the office today and has had some intermittent dizziness.  He said that he feels like he might be dehydrated.  I asked him to hold his losartan for now and increase his hydration.  He will need his creatinine checked within the next week or so.  I cautioned him about doing any heavy lifting that may require a Valsalva maneuver and could suddenly raise his blood pressure to high levels.  Plan:  I am going to plan to see him back in 1 year with a CT scan of the chest without contrast to follow-up on his aneurysm.  He will continue to follow-up with Dr. Bea Graff and EP concerning his blood pressure management, intermittent chest discomfort, and increased creatinine.  I spent 20 minutes performing this established patient evaluation and > 50% of this time was spent face to face counseling and coordinating the care of this patient's aortic aneurysm.    Fernande Boyden  Cyndia Bent, MD [facility] [phone]

## 2021-09-08 ENCOUNTER — Other Ambulatory Visit: Payer: Self-pay

## 2021-09-08 MED ORDER — PANTOPRAZOLE SODIUM 40 MG PO TBEC: 40.0000 mg | DELAYED_RELEASE_TABLET | Freq: Two times a day (BID) | ORAL | 0 refills | Status: DC

## 2021-10-06 ENCOUNTER — Encounter: Payer: Self-pay | Admitting: Cardiology

## 2021-10-06 ENCOUNTER — Ambulatory Visit: Payer: Medicare PPO | Attending: Cardiology | Admitting: Cardiology

## 2021-10-06 VITALS — BP 134/86 | HR 84 | Ht 71.0 in | Wt 231.6 lb

## 2021-10-06 DIAGNOSIS — I480 Paroxysmal atrial fibrillation: Secondary | ICD-10-CM | POA: Diagnosis not present

## 2021-10-06 DIAGNOSIS — D6869 Other thrombophilia: Secondary | ICD-10-CM | POA: Diagnosis not present

## 2021-10-06 NOTE — Patient Instructions (Signed)
Medication Instructions:  Your physician recommends that you continue on your current medications as directed. Please refer to the Current Medication list given to you today.  *If you need a refill on your cardiac medications before your next appointment, please call your pharmacy*   Lab Work: None ordered   Testing/Procedures: None ordered   Follow-Up: At [facility], you and your health needs are our priority.  As part of our continuing mission to provide you with exceptional heart care, we have created designated Provider Care Teams.  These Care Teams include your primary Cardiologist (physician) and Advanced Practice Providers (APPs -  Physician Assistants and Nurse Practitioners) who all work together to provide you with the care you need, when you need it.   Your next appointment:   1 year(s)  The format for your next appointment:   In Person  Provider:   Allegra Lai, MD in [place]   Thank you for choosing [facility]!!   Trinidad Curet, RN [phone]  Other Instructions   Important Information About Sugar

## 2021-10-06 NOTE — Progress Notes (Signed)
Electrophysiology Office Note   Date:  10/06/2021   ID:  Steve Anthony, DOB Sep 01, 1950, MRN [id number]  PCP:  [name], MD  Cardiologist:   Primary Electrophysiologist:  Nhat Hearne Meredith Leeds, MD    Chief Complaint: AF   History of Present Illness: Steve Anthony is a 71 y.o. male who is being seen today for the evaluation of AF at the request of [name], MD. Presenting today for electrophysiology evaluation.  He has a history significant for coronary artery disease status post PCI to the LAD, COPD, hyperlipidemia, hypertension, atrial fibrillation. He has had atrial fibrillation ablation in 2011.  Today, he denies symptoms of palpitations, chest pain, shortness of breath, orthopnea, PND, lower extremity edema, claudication, dizziness, presyncope, syncope, bleeding, or neurologic sequela. The patient is tolerating medications without difficulties.    Past Medical History:  Diagnosis Date   Anemia    Arthritis    Asthma    CAD (coronary artery disease)    a. S/P PCI mid LAD (vision stent) 08/31/2004;  b. repeat cath 6/11 - no progression of CAD. c. Cath 10/2012 given moderate CAD on cardiac CT - no change from prior cath.   Chest pain 12/16/2016   COPD (chronic obstructive pulmonary disease) ([place])    COVID-19 virus infection    Diverticulitis    DJD (degenerative joint disease) of lumbar spine    Dyslipidemia    Gallstones    GERD (gastroesophageal reflux disease)    History of kidney stones    Hypertension    Hyperthyroidism    Melanoma ([place])    melanoma removed from neck    On home oxygen therapy    "2L q hs" (11/08/2012),  06/04/19 pt. not on oxygen   OSA (obstructive sleep apnea)    a. cpap noncompliance- patient reports on 12/10/14- does not use CPAP   Palpitations    a. 10/2012 - s/p LINQ loop recorder to assess for arrhythmia.    Pancreatitis    Paroxysmal A-fib ([place])    a. S/P PVI 2/11 and 9/11   PICC (peripherally inserted central catheter) in  place    Pneumonia    SVT (supraventricular tachycardia) ([place])    a. Atypical AVNRT of the slow pathway   Type II diabetes mellitus [facility])    Past Surgical History:  Procedure Laterality Date   ATRIAL ABLATION SURGERY  2007   S/P slow pathway ablation for atypical AVNRT    BIOPSY  07/17/2018   Procedure: BIOPSY;  Surgeon: Irving Copas., MD;  [facility] ENDOSCOPY;  Service: Gastroenterology;;   CHOLECYSTECTOMY  ~ 2008   COLON RESECTION  02/2013    COLONOSCOPY  12/25/2007   Colonic polyp status post polypectomy. Internal hemorrhoids.    CORONARY ANGIOPLASTY WITH STENT PLACEMENT  08/30/2004   Vision; to LAD    CYSTOSCOPY W/ STONE MANIPULATION  ~ 1998   "once" (11/08/2012)   EP Study  2008   Negative EP study in [place]   ESOPHAGOGASTRODUODENOSCOPY (EGD) WITH PROPOFOL N/A 07/17/2018   Procedure: ESOPHAGOGASTRODUODENOSCOPY (EGD) WITH PROPOFOL;  Surgeon: Irving Copas., MD;  Location: [place];  Service: Gastroenterology;  Laterality: N/A;   FRACTURE SURGERY     LEFT HEART CATHETERIZATION WITH CORONARY ANGIOGRAM N/A 11/10/2012   Procedure: LEFT HEART CATHETERIZATION WITH CORONARY ANGIOGRAM;  Surgeon: Burnell Blanks, MD;  Location: [facility] CATH LAB;  Service: Cardiovascular;  Laterality: N/A;   LOOP RECORDER IMPLANT N/A 11/10/2012   Medtronic LinQ implanted by Dr Rayann Heman  for afib management   LUMBAR [place] SURGERY  2000; 2002   POSTERIOR LUMBAR FUSION  2004   PROSTATE SURGERY  ~ 2009   PVI/ CTI ablation  02/25/2009 and 09/2009   S/P afib/ CTI ablation   SPLENECTOMY, TOTAL N/A 06/07/2019   Procedure: SPLENECTOMY;  Surgeon: Stark Klein, MD;  Location: [place];  Service: General;  Laterality: N/A;   [place]   "broke in 3 places; had rod put in" (11/08/2012)   [place] Right ~ [place] Right 12/18/2014   Procedure: RIGHT TOTAL HIP ARTHROPLASTY ANTERIOR APPROACH;  Surgeon: Gaynelle Arabian,  MD;  Location: [facility];  Service: Orthopedics;  Laterality: Right;   UPPER ESOPHAGEAL ENDOSCOPIC ULTRASOUND (EUS) N/A 07/17/2018   Procedure: UPPER ESOPHAGEAL ENDOSCOPIC ULTRASOUND (EUS);  Surgeon: Irving Copas., MD;  Location: [place];  Service: Gastroenterology;  Laterality: N/A;     Current Outpatient Medications  Medication Sig Dispense Refill   acetaminophen (TYLENOL) 500 MG tablet Take 1,000 mg by mouth every 6 (six) hours as needed for mild pain, fever or headache.     apixaban (ELIQUIS) 5 MG TABS tablet Take 1 tablet by mouth twice daily 60 tablet 6   Cyanocobalamin (VITAMIN B-12 IJ) Inject 1,000 mcg as directed every 30 (thirty) days.     docusate sodium (COLACE) 100 MG capsule Take 1 capsule (100 mg total) by mouth 2 (two) times daily as needed for mild constipation. 10 capsule 0   escitalopram (LEXAPRO) 10 MG tablet Take 10 mg by mouth daily.  1   fenofibrate 160 MG tablet Take 160 mg by mouth daily.     ferrous sulfate 325 (65 FE) MG tablet Take 325 mg by mouth See admin instructions. Monday Wednesday Friday     furosemide (LASIX) 20 MG tablet Take 2 tablets by mouth daily. 1 tab sometimes     gabapentin (NEURONTIN) 300 MG capsule Take 1 capsule (300 mg total) by mouth 3 (three) times daily. 90 capsule 0   glimepiride (AMARYL) 1 MG tablet Take 1 mg by mouth 2 (two) times daily.     Insulin Glargine (BASAGLAR KWIKPEN) 100 UNIT/ML Inject 30 Units into the skin daily.     Insulin Pen Needle 29G X 5MM MISC 1 Device by Does not apply route 3 (three) times daily as needed (for use with insulin pen). 100 each 0   JANUVIA 50 MG tablet Take 50 mg by mouth daily.     losartan (COZAAR) 25 MG tablet Take 1/2 (one-half) tablet by mouth once daily 45 tablet 0   metFORMIN (GLUCOPHAGE) 500 MG tablet Take 500 mg by mouth 2 (two) times daily.     metoprolol tartrate (LOPRESSOR) 50 MG tablet Take 1 tablet by mouth once daily 90 tablet 2   oxyCODONE-acetaminophen (PERCOCET) 10-325 MG  tablet Take 1 tablet by mouth every 4 (four) hours.     pantoprazole (PROTONIX) 40 MG tablet Take 1 tablet (40 mg total) by mouth 2 (two) times daily. Patient is overdue for follow up appointment.  Please call [phone] to schedule an appointment for future refills. 60 tablet 0   Tamsulosin HCl (FLOMAX) 0.4 MG CAPS Take 0.4 mg by mouth daily.     traZODone (DESYREL) 50 MG tablet Take 100 mg by mouth at bedtime.      No current facility-administered medications for this visit.    Allergies:   Penicillins   Social History:  The  patient  reports that he quit smoking about 17 years ago. His smoking use included cigarettes. He has a 114.00 pack-year smoking history. He quit smokeless tobacco use about 17 years ago.  His smokeless tobacco use included chew. He reports that he does not currently use alcohol. He reports that he does not use drugs.   Family History:  The patient's family history includes Diabetes in his father; Emphysema in his maternal grandfather and mother; Heart attack in his father; Heart disease in his father and paternal grandfather; Hypertension in his father.    ROS:  Please see the history of present illness.   Otherwise, review of systems is positive for none.   All other systems are reviewed and negative.    PHYSICAL EXAM: VS:  BP 134/86   Pulse 84   Ht 5' 11"  (1.803 m)   Wt 231 lb 9.6 oz (105.1 kg)   SpO2 92%   BMI 32.30 kg/m  , BMI Body mass index is 32.3 kg/m. GEN: Well nourished, well developed, in no acute distress  HEENT: normal  Neck: no JVD, carotid bruits, or masses Cardiac: RRR; no murmurs, rubs, or gallops,no edema  Respiratory:  clear to auscultation bilaterally, normal work of breathing GI: soft, nontender, nondistended, + BS MS: no deformity or atrophy  Skin: warm and dry Neuro:  Strength and sensation are intact Psych: euthymic mood, full affect  EKG:  EKG is ordered today. Personal review of the ekg ordered shows sinus rhythm, rate 84,  PVC  Recent Labs: No results found for requested labs within last 365 days.    Lipid Panel     Component Value Date/Time   CHOL 113 02/20/2019 2042   TRIG 276 (H) 02/20/2019 2042   HDL 20 (L) 02/20/2019 2042   CHOLHDL 5.7 02/20/2019 2042   VLDL 55 (H) 02/20/2019 2042   LDLCALC 38 02/20/2019 2042     Wt Readings from Last 3 Encounters:  10/06/21 231 lb 9.6 oz (105.1 kg)  07/29/21 240 lb (108.9 kg)  05/04/21 240 lb 9.6 oz (109.1 kg)      Other studies Reviewed: Additional studies/ records that were reviewed today include: TTE 06/13/20  Review of the above records today demonstrates:   1. Left ventricular ejection fraction, by estimation, is 60 to 65%. The  left ventricle has normal function. The left ventricle has no regional  wall motion abnormalities. Left ventricular diastolic parameters are  indeterminate.   2. Right ventricular systolic function is normal. The right ventricular  size is normal.   3. Left atrial size was mildly dilated.   4. Right atrial size was mildly dilated.   5. The mitral valve is grossly normal. No evidence of mitral valve  regurgitation.   6. The aortic valve is grossly normal. Aortic valve regurgitation is not  visualized. No aortic stenosis is present.   7. Aortic dilatation noted. There is moderate dilatation of the ascending  aorta, measuring 46 mm. There is moderate dilatation of the aortic root,  measuring 46 mm.   8. The inferior vena cava is normal in size with greater than 50%  respiratory variability, suggesting right atrial pressure of 3 mmHg.   Coronary CT 07/17/2020 1. Unable to determine coronary calcium score due to LAD stent, but total calcium score in RCA and LCX is 1316, which would be 91st percentile for age and sex matched control even with excluding the LAD 2. Normal coronary origin with right dominance. 3. Calcified plaque in the mid LCX  appears to cause severe (70-99%) stenosis, though may be overestimating due to  blooming artifact from significant calcifications. Unable to send for CTFFR due to LAD stent. Recommend stress testing to evaluate for ischemia. 4. Mid LAD stent appears patent. There is an area of low attenuation suggesting in-stent restenosis but does not appear to be causing significant stenosis. 5. Calcified plaque causing mild (25-49%) stenosis in the proximal/mid/distal RCA, proximal LCX, and proximal LAD 6.  Dilated ascending aorta measures 31m  Myoview 11/21/2020   The study is normal. The study is low risk.   No ST deviation was noted.   LV perfusion is normal. There is no evidence of ischemia. There is no evidence of infarction.   Left ventricular function is normal. Nuclear stress EF: 63 %. The left ventricular ejection fraction is normal (55-65%). End diastolic cavity size is normal. End systolic cavity size is normal.   Prior study available for comparison from 12/17/2016. No changes compared to prior study.  ASSESSMENT AND PLAN:  1.  Paroxysmal atrial fibrillation: Status post ablation in 2011.  CHA2DS2-VASc of 4.  Currently on Eliquis 5 mg twice daily.  Remains in sinus rhythm.  We Juanantonio Stolar continue with current management.  2.  Hypertension: Currently well controlled  3.  Coronary artery disease: Has stable angina.  Stent to the LAD previously.  Stress test shows no ischemia.  4.  Ascending aortic aneurysm: Followed by [facility]CS.  5.  Secondary hypercoagulable state: Currently on Eliquis for atrial fibrillation as above  Current medicines are reviewed at length with the patient today.   The patient does not have concerns regarding his medicines.  The following changes were made today:  none  Labs/ tests ordered today include:  Orders Placed This Encounter  Procedures   EKG 12-Lead     Disposition:   FU with Nadea Kirkland 1 year  Signed, Kinberly Perris MMeredith Leeds MD  10/06/2021 9:45 AM     [facility] [address]  [phone](office) [phone](fax)

## 2021-10-21 ENCOUNTER — Ambulatory Visit: Payer: Medicare PPO | Admitting: Gastroenterology

## 2021-10-21 ENCOUNTER — Encounter: Payer: Self-pay | Admitting: Gastroenterology

## 2021-10-21 VITALS — BP 100/60 | HR 80 | Ht 72.0 in | Wt 232.0 lb

## 2021-10-21 DIAGNOSIS — K219 Gastro-esophageal reflux disease without esophagitis: Secondary | ICD-10-CM | POA: Diagnosis not present

## 2021-10-21 MED ORDER — PANTOPRAZOLE SODIUM 40 MG PO TBEC: 40.0000 mg | DELAYED_RELEASE_TABLET | Freq: Two times a day (BID) | ORAL | 3 refills | Status: DC

## 2021-10-21 NOTE — Patient Instructions (Addendum)
We have sent the following medications to your pharmacy for you to pick up at your convenience: Pantoprazole   _______________________________________________________  If you are age 70 or older, your body mass index should be between 23-30. Your Body mass index is 31.46 kg/m. If this is out of the aforementioned range listed, please consider follow up with your Primary Care Provider.  If you are age 63 or younger, your body mass index should be between 19-25. Your Body mass index is 31.46 kg/m. If this is out of the aformentioned range listed, please consider follow up with your Primary Care Provider.   ________________________________________________________  The [facility] providers would like to encourage you to use [facility] to communicate with providers for non-urgent requests or questions.  Due to long hold times on the telephone, sending your provider a message by [facility] may be a faster and more efficient way to get a response.  Please allow 48 business hours for a response.  Please remember that this is for non-urgent requests.  _______________________________________________________   I appreciate the opportunity to care for you. Alonza Bogus, PA-C

## 2021-10-21 NOTE — Progress Notes (Signed)
10/21/2021 Steve Anthony [id number] February 26, 1950   HISTORY OF PRESENT ILLNESS: This is a 71 year old male who is a patient of Dr. Steve Rattler.  He has extensive past medical history as listed below.  Had an open distal pancreatectomy with debridement of pseudo pancreatic cyst and a splenectomy by Dr. [name] in May 2021.  He is here today to get refills of his pantoprazole.  He is taking pantoprazole 40 mg twice daily.  He is doing well on that medication.  Has no complaints today.  Denies any heartburn or reflux.  Says his appetite is good.  Declines any further colonoscopy evaluations.   Past Medical History:  Diagnosis Date   Anemia    Arthritis    Asthma    CAD (coronary artery disease)    a. S/P PCI mid LAD (vision stent) 08/31/2004;  b. repeat cath 6/11 - no progression of CAD. c. Cath 10/2012 given moderate CAD on cardiac CT - no change from prior cath.   Chest pain 12/16/2016   COPD (chronic obstructive pulmonary disease) ([place])    COVID-19 virus infection    Diverticulitis    DJD (degenerative joint disease) of lumbar spine    Dyslipidemia    Gallstones    GERD (gastroesophageal reflux disease)    History of kidney stones    Hypertension    Hyperthyroidism    Melanoma ([place])    melanoma removed from neck    On home oxygen therapy    "2L q hs" (11/08/2012),  06/04/19 pt. not on oxygen   OSA (obstructive sleep apnea)    a. cpap noncompliance- patient reports on 12/10/14- does not use CPAP   Palpitations    a. 10/2012 - s/p LINQ loop recorder to assess for arrhythmia.    Pancreatitis    Paroxysmal A-fib ([place])    a. S/P PVI 2/11 and 9/11   PICC (peripherally inserted central catheter) in place    Pneumonia    SVT (supraventricular tachycardia) ([place])    a. Atypical AVNRT of the slow pathway   Type II diabetes mellitus [facility])    Past Surgical History:  Procedure Laterality Date   ATRIAL ABLATION SURGERY  2007   S/P slow pathway ablation for atypical AVNRT    BIOPSY   07/17/2018   Procedure: BIOPSY;  Surgeon: Irving Copas., MD;  Location: [facility] ENDOSCOPY;  Service: Gastroenterology;;   CHOLECYSTECTOMY  ~ 2008   COLON RESECTION  02/2013    COLONOSCOPY  12/25/2007   Colonic polyp status post polypectomy. Internal hemorrhoids.    CORONARY ANGIOPLASTY WITH STENT PLACEMENT  08/30/2004   Vision; to LAD    CYSTOSCOPY W/ STONE MANIPULATION  ~ 1998   "once" (11/08/2012)   EP Study  2008   Negative EP study in [place]   ESOPHAGOGASTRODUODENOSCOPY (EGD) WITH PROPOFOL N/A 07/17/2018   Procedure: ESOPHAGOGASTRODUODENOSCOPY (EGD) WITH PROPOFOL;  Surgeon: Irving Copas., MD;  Location: [place];  Service: Gastroenterology;  Laterality: N/A;   FRACTURE SURGERY     LEFT HEART CATHETERIZATION WITH CORONARY ANGIOGRAM N/A 11/10/2012   Procedure: LEFT HEART CATHETERIZATION WITH CORONARY ANGIOGRAM;  Surgeon: Burnell Blanks, MD;  Location: [facility];  Service: Cardiovascular;  Laterality: N/A;   LOOP RECORDER IMPLANT N/A 11/10/2012   Medtronic LinQ implanted by Dr Rayann Heman for afib management   LUMBAR [place] SURGERY  2000; 2002   POSTERIOR LUMBAR FUSION  2004   PROSTATE SURGERY  ~ 2009   PVI/ CTI ablation  02/25/2009 and 09/2009  S/P afib/ CTI ablation   SPLENECTOMY, TOTAL N/A 06/07/2019   Procedure: SPLENECTOMY;  Surgeon: Stark Klein, MD;  Location: [place];  Service: General;  Laterality: N/A;   [place]   "broke in 3 places; had rod put in" (11/08/2012)   [place] Right ~ [place] Right 12/18/2014   Procedure: RIGHT TOTAL HIP ARTHROPLASTY ANTERIOR APPROACH;  Surgeon: Gaynelle Arabian, MD;  Location: [facility];  Service: Orthopedics;  Laterality: Right;   UPPER ESOPHAGEAL ENDOSCOPIC ULTRASOUND (EUS) N/A 07/17/2018   Procedure: UPPER ESOPHAGEAL ENDOSCOPIC ULTRASOUND (EUS);  Surgeon: Irving Copas., MD;  Location: [place];  Service: Gastroenterology;   Laterality: N/A;    reports that he quit smoking about 17 years ago. His smoking use included cigarettes. He has a 114.00 pack-year smoking history. He quit smokeless tobacco use about 17 years ago.  His smokeless tobacco use included chew. He reports that he does not currently use alcohol. He reports that he does not use drugs. family history includes Diabetes in his father; Emphysema in his maternal grandfather and mother; Heart attack in his father; Heart disease in his father and paternal grandfather; Hypertension in his father. Allergies  Allergen Reactions   Penicillins Hives and Shortness Of Breath    Did it involve swelling of the face/tongue/throat, SOB, or low BP? Yes Did it involve sudden or severe rash/hives, skin peeling, or any reaction on the inside of your mouth or nose? No Did you need to seek medical attention at a hospital or doctor's office? Yes (emergency room) When did it last happen?  1974 If all above answers are "NO", may proceed with cephalosporin use.        Outpatient Encounter Medications as of 10/21/2021  Medication Sig   acetaminophen (TYLENOL) 500 MG tablet Take 1,000 mg by mouth every 6 (six) hours as needed for mild pain, fever or headache.   apixaban (ELIQUIS) 5 MG TABS tablet Take 1 tablet by mouth twice daily   baclofen (LIORESAL) 10 MG tablet Take 10 mg by mouth 3 (three) times daily.   Cyanocobalamin (VITAMIN B-12 IJ) Inject 1,000 mcg as directed every 30 (thirty) days.   docusate sodium (COLACE) 100 MG capsule Take 1 capsule (100 mg total) by mouth 2 (two) times daily as needed for mild constipation.   escitalopram (LEXAPRO) 10 MG tablet Take 10 mg by mouth daily.   fenofibrate 160 MG tablet Take 160 mg by mouth daily.   ferrous sulfate 325 (65 FE) MG tablet Take 325 mg by mouth See admin instructions. Monday Wednesday Friday   furosemide (LASIX) 20 MG tablet Take 2 tablets by mouth daily. 1 tab sometimes   gabapentin (NEURONTIN) 300 MG capsule Take  1 capsule (300 mg total) by mouth 3 (three) times daily.   Insulin Glargine (BASAGLAR KWIKPEN) 100 UNIT/ML Inject 30 Units into the skin daily.   Insulin Pen Needle 29G X 5MM MISC 1 Device by Does not apply route 3 (three) times daily as needed (for use with insulin pen).   JARDIANCE 25 MG TABS tablet Take 25 mg by mouth daily.   losartan (COZAAR) 25 MG tablet Take 1/2 (one-half) tablet by mouth once daily   metFORMIN (GLUCOPHAGE) 500 MG tablet Take 500 mg by mouth 2 (two) times daily.   metoprolol tartrate (LOPRESSOR) 50 MG tablet Take 1 tablet by mouth once daily   nitroGLYCERIN (NITROSTAT) 0.4 MG SL tablet Place 0.4 mg under  the tongue every 5 (five) minutes as needed for chest pain.   oxyCODONE-acetaminophen (PERCOCET) 10-325 MG tablet Take 1 tablet by mouth every 4 (four) hours.   pantoprazole (PROTONIX) 40 MG tablet Take 1 tablet (40 mg total) by mouth 2 (two) times daily. Patient is overdue for follow up appointment.  Please call [phone] to schedule an appointment for future refills.   Tamsulosin HCl (FLOMAX) 0.4 MG CAPS Take 0.4 mg by mouth daily.   traZODone (DESYREL) 50 MG tablet Take 100 mg by mouth at bedtime.    glimepiride (AMARYL) 1 MG tablet Take 1 mg by mouth 2 (two) times daily. (Patient not taking: Reported on 10/21/2021)   JANUVIA 50 MG tablet Take 50 mg by mouth daily. (Patient not taking: Reported on 10/21/2021)   No facility-administered encounter medications on file as of 10/21/2021.     REVIEW OF SYSTEMS  : All other systems reviewed and negative except where noted in the History of Present Illness.   PHYSICAL EXAM: BP 100/60 (BP Location: Left Arm, Patient Position: Sitting, Cuff Size: Normal)   Pulse 80   Ht 6' (1.829 m)   Wt 232 lb (105.2 kg)   SpO2 94%   BMI 31.46 kg/m  General: Well developed white male in no acute distress Head: Normocephalic and atraumatic Eyes:  Sclerae anicteric, conjunctiva pink. Ears: Normal auditory acuity Lungs: Clear  throughout to auscultation; no W/R/R. Heart: Regular rate and rhythm; no M/R/G. Abdomen: Soft, non-distended.  BS present.  Non-tender.  Scars noted from previous surgeries. Musculoskeletal: Symmetrical with no gross deformities  Skin: No lesions on visible extremities Extremities: No edema  Neurological: Alert oriented x 4, grossly non-focal Psychological:  Alert and cooperative. Normal mood and affect  ASSESSMENT AND PLAN: *GERD: Well-controlled on pantoprazole 40 mg twice daily. Will continue this.  New prescription sent to pharmacy.  Follow-up in a year for further refills or sooner if needed.   CC:  [name], MD

## 2021-10-21 NOTE — Progress Notes (Signed)
Agree with assessment/plan.  Carmell Austria, MD [facility] [phone]

## 2022-03-24 ENCOUNTER — Telehealth: Payer: Self-pay | Admitting: Cardiology

## 2022-03-24 NOTE — Telephone Encounter (Signed)
Per Dr. Curt Bears pt should be seen by us/afib clinic. Pt scheduled to see AFib clinic this Friday. Aware I will send information via mychart with where to go for the afib clinic. Patient verbalized understanding and agreeable to plan.

## 2022-03-24 NOTE — Telephone Encounter (Signed)
Pt reports afib off and on for the past couple months. States that it has gotten worse lately (reports brother died end of last month), more frequent. He is experiencing chest discomfort, some sob, some dizziness/light headedness secondary to the afib. Non radiating chest discomfort, denies edema. BP just now was 113/93, HR 101. HRs have been jumping around 50s - 130s, but this has occurred for several months now. Pt currently feeling fine.  Pt aware am forwarding this to MD and/or AFib clinic for advisement.  Aware MD is in the hospital today and may be later before he can review/advise. Aware AFib clinic can hopefully get him in this week for further evaluation. Understands that someone will call him back today w/ advisement. Pt agreeable to plan and will call back if things change/worsen.

## 2022-03-24 NOTE — Telephone Encounter (Signed)
Patient c/o Palpitations:  High priority if patient c/o lightheadedness, shortness of breath, or chest pain  How long have you had palpitations/irregular HR/ Afib? Are you having the symptoms now?few days now,  No   Are you currently experiencing lightheadedness, SOB or CP? SOB and some CP ("maybe a little CP not a whole lot"   Do you have a history of afib (atrial fibrillation) or irregular heart rhythm? Yes   Have you checked your BP or HR? (document readings if available): hr: 67 then went up to 113 then down to 72 again   Are you experiencing any other symptoms? No

## 2022-03-26 ENCOUNTER — Encounter (HOSPITAL_COMMUNITY): Payer: Self-pay | Admitting: Physician Assistant

## 2022-03-26 ENCOUNTER — Ambulatory Visit (HOSPITAL_COMMUNITY)
Admission: RE | Admit: 2022-03-26 | Discharge: 2022-03-26 | Disposition: A | Discharge status: Home / Self Care | Payer: Medicare HMO | Source: Ambulatory Visit | Attending: Physician Assistant | Admitting: Physician Assistant

## 2022-03-26 VITALS — BP 128/98 | HR 93 | Ht 72.0 in | Wt 236.0 lb

## 2022-03-26 DIAGNOSIS — Z87891 Personal history of nicotine dependence: Secondary | ICD-10-CM | POA: Diagnosis not present

## 2022-03-26 DIAGNOSIS — Z7901 Long term (current) use of anticoagulants: Secondary | ICD-10-CM | POA: Insufficient documentation

## 2022-03-26 DIAGNOSIS — I4819 Other persistent atrial fibrillation: Secondary | ICD-10-CM | POA: Insufficient documentation

## 2022-03-26 DIAGNOSIS — Z634 Disappearance and death of family member: Secondary | ICD-10-CM | POA: Insufficient documentation

## 2022-03-26 DIAGNOSIS — Z7984 Long term (current) use of oral hypoglycemic drugs: Secondary | ICD-10-CM | POA: Insufficient documentation

## 2022-03-26 DIAGNOSIS — Z79899 Other long term (current) drug therapy: Secondary | ICD-10-CM | POA: Insufficient documentation

## 2022-03-26 DIAGNOSIS — I2510 Atherosclerotic heart disease of native coronary artery without angina pectoris: Secondary | ICD-10-CM | POA: Diagnosis not present

## 2022-03-26 DIAGNOSIS — E785 Hyperlipidemia, unspecified: Secondary | ICD-10-CM | POA: Diagnosis not present

## 2022-03-26 DIAGNOSIS — E0590 Thyrotoxicosis, unspecified without thyrotoxic crisis or storm: Secondary | ICD-10-CM | POA: Insufficient documentation

## 2022-03-26 DIAGNOSIS — E119 Type 2 diabetes mellitus without complications: Secondary | ICD-10-CM | POA: Diagnosis not present

## 2022-03-26 DIAGNOSIS — J449 Chronic obstructive pulmonary disease, unspecified: Secondary | ICD-10-CM | POA: Insufficient documentation

## 2022-03-26 DIAGNOSIS — D6869 Other thrombophilia: Secondary | ICD-10-CM | POA: Insufficient documentation

## 2022-03-26 DIAGNOSIS — I471 Supraventricular tachycardia, unspecified: Secondary | ICD-10-CM | POA: Insufficient documentation

## 2022-03-26 DIAGNOSIS — G4733 Obstructive sleep apnea (adult) (pediatric): Secondary | ICD-10-CM | POA: Insufficient documentation

## 2022-03-26 DIAGNOSIS — Z794 Long term (current) use of insulin: Secondary | ICD-10-CM | POA: Diagnosis not present

## 2022-03-26 DIAGNOSIS — I4892 Unspecified atrial flutter: Secondary | ICD-10-CM | POA: Diagnosis not present

## 2022-03-26 HISTORY — DX: Other thrombophilia: D68.69

## 2022-03-26 MED ORDER — METOPROLOL TARTRATE 50 MG PO TABS: 50.0000 mg | ORAL_TABLET | Freq: Two times a day (BID) | ORAL | 2 refills | Status: DC

## 2022-03-26 NOTE — H&P (View-Only) (Signed)
  Primary Care Physician: Grisso, Greg A., MD Primary Cardiologist: none Primary Electrophysiologist: Dr Camnitz Referring Physician: Dr Camnitz   Steve Anthony is a 71 y.o. male with a history of CAD, COPD, HLD, SVT, DM, OSA, hyperthyroidism, HTN, atrial flutter, atrial fibrillation who presents for follow up in the  Atrial Fibrillation Clinic. Patient is s/p SVT ablation in 2007 and afib/flutter ablation in 2011 with Dr Allred. Patient is on Eliquis for a CHADS2VASC score of 4.  On follow up today, patient reports that for the last 6-8 weeks he has been having symptoms of palpitations, chest discomfort, intermittent dizziness, and labile heart rates. He is in afib today. He has been under stress lately after his brother passed away unexpectedly. No bleeding issues on anticoagulation.   Today, he denies symptoms of shortness of breath, orthopnea, PND, lower extremity edema, presyncope, syncope, snoring, daytime somnolence, bleeding, or neurologic sequela. The patient is tolerating medications without difficulties and is otherwise without complaint today.    Atrial Fibrillation Risk Factors:  he does have symptoms or diagnosis of sleep apnea. he is not compliant with CPAP therapy. he does not have a history of rheumatic fever. The patient does have a history of early familial atrial fibrillation or other arrhythmias.  he has a BMI of Body mass index is 32.01 kg/m.. Filed Weights   03/26/22 0825  Weight: 107 kg    Family History  Problem Relation Age of Onset   Emphysema Mother    Heart attack Father        CVA   Hypertension Father    Diabetes Father    Heart disease Father        MI   Emphysema Maternal Grandfather        Smoker   Heart disease Paternal Grandfather    Colon cancer Neg Hx    Esophageal cancer Neg Hx    Inflammatory bowel disease Neg Hx    Liver disease Neg Hx    Pancreatic cancer Neg Hx    Rectal cancer Neg Hx    Stomach cancer Neg Hx       Atrial Fibrillation Management history:  Previous antiarrhythmic drugs: amiodarone Previous cardioversions: none Previous ablations: 2007 SVT, 2011 afib and flutter CHADS2VASC score: 4 Anticoagulation history: warfarin, Eliquis   Past Medical History:  Diagnosis Date   Anemia    Arthritis    Asthma    CAD (coronary artery disease)    a. S/P PCI mid LAD (vision stent) 08/31/2004;  b. repeat cath 6/11 - no progression of CAD. c. Cath 10/2012 given moderate CAD on cardiac CT - no change from prior cath.   Chest pain 12/16/2016   COPD (chronic obstructive pulmonary disease) ([facility])    COVID-19 virus infection    Diverticulitis    DJD (degenerative joint disease) of lumbar spine    Dyslipidemia    Gallstones    GERD (gastroesophageal reflux disease)    History of kidney stones    Hypertension    Hyperthyroidism    Melanoma ([facility])    melanoma removed from neck    On home oxygen therapy    "2L q hs" (11/08/2012),  06/04/19 pt. not on oxygen   OSA (obstructive sleep apnea)    a. cpap noncompliance- patient reports on 12/10/14- does not use CPAP   Palpitations    a. 10/2012 - s/p LINQ loop recorder to assess for arrhythmia.    Pancreatitis    Paroxysmal A-fib ([facility])      a. S/P PVI 2/11 and 9/11   PICC (peripherally inserted central catheter) in place    Pneumonia    SVT (supraventricular tachycardia)    a. Atypical AVNRT of the slow pathway   Type II diabetes mellitus ([facility])    Past Surgical History:  Procedure Laterality Date   ATRIAL ABLATION SURGERY  2007   S/P slow pathway ablation for atypical AVNRT    BIOPSY  07/17/2018   Procedure: BIOPSY;  Surgeon: Mansouraty, Gabriel Jr., MD;  Location: [facility] ENDOSCOPY;  Service: Gastroenterology;;   CHOLECYSTECTOMY  ~ 2008   COLON RESECTION  02/2013    COLONOSCOPY  12/25/2007   Colonic polyp status post polypectomy. Internal hemorrhoids.    CORONARY ANGIOPLASTY WITH STENT PLACEMENT  08/30/2004   Vision; to LAD    CYSTOSCOPY W/ STONE  MANIPULATION  ~ 1998   "once" (11/08/2012)   EP Study  2008   Negative EP study in [place]   ESOPHAGOGASTRODUODENOSCOPY (EGD) WITH PROPOFOL N/A 07/17/2018   Procedure: ESOPHAGOGASTRODUODENOSCOPY (EGD) WITH PROPOFOL;  Surgeon: Mansouraty, Gabriel Jr., MD;  Location: [facility] ENDOSCOPY;  Service: Gastroenterology;  Laterality: N/A;   FRACTURE SURGERY     LEFT HEART CATHETERIZATION WITH CORONARY ANGIOGRAM N/A 11/10/2012   Procedure: LEFT HEART CATHETERIZATION WITH CORONARY ANGIOGRAM;  Surgeon: Christopher D McAlhany, MD;  Location: [facility] CATH LAB;  Service: Cardiovascular;  Laterality: N/A;   LOOP RECORDER IMPLANT N/A 11/10/2012   Medtronic LinQ implanted by Dr Allred for afib management   LUMBAR [place] SURGERY  2000; 2002   POSTERIOR LUMBAR FUSION  2004   PROSTATE SURGERY  ~ 2009   PVI/ CTI ablation  02/25/2009 and 09/2009   S/P afib/ CTI ablation   SPLENECTOMY, TOTAL N/A 06/07/2019   Procedure: SPLENECTOMY;  Surgeon: Byerly, Faera, MD;  Location: [facility];  Service: General;  Laterality: N/A;   TIBIA FRACTURE SURGERY Right 1990   "broke in 3 places; had rod put in" (11/08/2012)   TIBIA HARDWARE REMOVAL Right ~ 1991   TONSILLECTOMY  1965   TOTAL HIP ARTHROPLASTY Right 12/18/2014   Procedure: RIGHT TOTAL HIP ARTHROPLASTY ANTERIOR APPROACH;  Surgeon: Frank Aluisio, MD;  Location: [facility];  Service: Orthopedics;  Laterality: Right;   UPPER ESOPHAGEAL ENDOSCOPIC ULTRASOUND (EUS) N/A 07/17/2018   Procedure: UPPER ESOPHAGEAL ENDOSCOPIC ULTRASOUND (EUS);  Surgeon: Mansouraty, Gabriel Jr., MD;  Location: [facility];  Service: Gastroenterology;  Laterality: N/A;    Current Outpatient Medications  Medication Sig Dispense Refill   acetaminophen (TYLENOL) 500 MG tablet Take 1,000 mg by mouth every 6 (six) hours as needed for mild pain, fever or headache.     apixaban (ELIQUIS) 5 MG TABS tablet Take 1 tablet by mouth twice daily 60 tablet 6   baclofen (LIORESAL) 10 MG tablet Take 10 mg by mouth 3 (three) times  daily.     Cyanocobalamin (VITAMIN B-12 IJ) Inject 1,000 mcg as directed every 30 (thirty) days.     docusate sodium (COLACE) 100 MG capsule Take 1 capsule (100 mg total) by mouth 2 (two) times daily as needed for mild constipation. 10 capsule 0   escitalopram (LEXAPRO) 10 MG tablet Take 10 mg by mouth daily.  1   fenofibrate 160 MG tablet Take 160 mg by mouth daily.     ferrous sulfate 325 (65 FE) MG tablet Take 325 mg by mouth See admin instructions. Monday Wednesday Friday     furosemide (LASIX) 20 MG tablet Take 2 tablets by mouth daily. 1 tab sometimes     gabapentin (  NEURONTIN) 300 MG capsule Take 1 capsule (300 mg total) by mouth 3 (three) times daily. 90 capsule 0   glimepiride (AMARYL) 1 MG tablet Take 1 mg by mouth 2 (two) times daily.     Insulin Glargine (BASAGLAR KWIKPEN) 100 UNIT/ML Inject 30 Units into the skin daily.     Insulin Pen Needle 29G X 5MM MISC 1 Device by Does not apply route 3 (three) times daily as needed (for use with insulin pen). 100 each 0   JANUVIA 50 MG tablet Take 50 mg by mouth daily.     JARDIANCE 25 MG TABS tablet Take 25 mg by mouth daily.     losartan (COZAAR) 25 MG tablet Take 1/2 (one-half) tablet by mouth once daily 45 tablet 0   metFORMIN (GLUCOPHAGE) 500 MG tablet Take 500 mg by mouth 2 (two) times daily.     metoprolol tartrate (LOPRESSOR) 50 MG tablet Take 1 tablet by mouth once daily 90 tablet 2   nitroGLYCERIN (NITROSTAT) 0.4 MG SL tablet Place 0.4 mg under the tongue every 5 (five) minutes as needed for chest pain.     oxyCODONE-acetaminophen (PERCOCET) 10-325 MG tablet Take 1 tablet by mouth every 4 (four) hours.     pantoprazole (PROTONIX) 40 MG tablet Take 1 tablet (40 mg total) by mouth 2 (two) times daily. 180 tablet 3   Tamsulosin HCl (FLOMAX) 0.4 MG CAPS Take 0.4 mg by mouth daily.     traZODone (DESYREL) 50 MG tablet Take 100 mg by mouth at bedtime.      No current facility-administered medications for this encounter.    Allergies   Allergen Reactions   Penicillins Hives and Shortness Of Breath    Did it involve swelling of the face/tongue/throat, SOB, or low BP? Yes Did it involve sudden or severe rash/hives, skin peeling, or any reaction on the inside of your mouth or nose? No Did you need to seek medical attention at a hospital or doctor's office? Yes (emergency room) When did it last happen?  1974 If all above answers are "NO", may proceed with cephalosporin use.      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  Tobacco Use   Smoking status: Former    Packs/day: 3.00    Years: 38.00    Total pack years: 114.00    Types: Cigarettes    Quit date: 03/25/2004    Years since quitting: 18.0   Smokeless tobacco: Former    Types: Chew    Quit date: 04/25/2004   Tobacco comments:    Former smoker 03/26/22  Vaping Use   Vaping Use: Never used  Substance and Sexual Activity   Alcohol use: Not Currently    Comment:  "quit drinking in 1989"   Drug use: No   Sexual activity: Not Currently  Other Topics Concern   Not on file  Social History Narrative   Resides in [place] with his wife   Two children and two grandchildren   Attends [employer]   Social Determinants of Health   Financial Resource Strain: Not on file  Food Insecurity: Not on file  Transportation Needs: Not on file  Physical Activity: Not on   file  Stress: Not on file  Social Connections: Not on file  Intimate Partner Violence: Not on file     ROS- All systems are reviewed and negative except as per the HPI above.  Physical Exam: Vitals:   03/26/22 0825  BP: (!) 128/98  Pulse: 93  SpO2: (!) 89%  Weight: 107 kg  Height: 6' (1.829 m)    GEN- The patient is a  well appearing male, alert and oriented x 3 today.   Head- normocephalic, atraumatic Eyes-  Sclera clear, conjunctiva pink Ears- hearing intact Oropharynx- clear Neck- supple  Lungs- Clear to ausculation bilaterally, normal work of breathing Heart- irregular rate and rhythm, no murmurs, rubs or gallops  GI- soft, NT, ND, + BS Extremities- no clubbing, cyanosis, or edema MS- no significant deformity or atrophy Skin- no rash or lesion Psych- euthymic mood, full affect Neuro- strength and sensation are intact  Wt Readings from Last 3 Encounters:  03/26/22 107 kg  10/21/21 105.2 kg  10/06/21 105.1 kg    EKG today demonstrates  Afib, slow R wave prog Vent. rate 93 BPM PR interval * ms QRS duration 84 ms QT/QTcB 350/435 ms  Echo 06/13/20 demonstrated   1. Left ventricular ejection fraction, by estimation, is 60 to 65%. The  left ventricle has normal function. The left ventricle has no regional  wall motion abnormalities. Left ventricular diastolic parameters are  indeterminate.   2. Right ventricular systolic function is normal. The right ventricular  size is normal.   3. Left atrial size was mildly dilated.   4. Right atrial size was mildly dilated.   5. The mitral valve is grossly normal. No evidence of mitral valve  regurgitation.   6. The aortic valve is grossly normal. Aortic valve regurgitation is not  visualized. No aortic stenosis is present.   7. Aortic dilatation noted. There is moderate dilatation of the ascending  aorta, measuring 46 mm. There is moderate dilatation of the aortic root,  measuring 46 mm.   8. The inferior vena cava is normal in size with greater than 50%  respiratory variability, suggesting right atrial pressure of 3 mmHg.   Comparison(s): A prior study was performed on 12/17/2016. Prior images  reviewed side by side. Increase in aortic root and ascending aortic  measurements from prior study.   Epic records are reviewed at length  today  CHA2DS2-VASc Score = 4  The patient's score is based upon: CHF History: 0 HTN History: 1 Diabetes History: 1 Stroke History: 0 Vascular Disease History: 1 Age Score: 1 Gender Score: 0       ASSESSMENT AND PLAN: 1. Persistent Atrial Fibrillation/atrial flutter The patient's CHA2DS2-VASc score is 4, indicating a 4.8% annual risk of stroke.   Patient appears to be in persistent afib. ? Related to recent passing of family member. We discussed rhythm control options today. After discussing the risks and benefits, will arrange for DCCV.  Continue Eliquis 5 mg BID Will increase Lopressor to 50 mg BID (had only been taking once daily) We did briefly discuss that if his afib returns quickly, he may be a candidate for repeat ablation.   2. Secondary Hypercoagulable State (ICD10:  D68.69) The patient is at significant risk for stroke/thromboembolism based upon his CHA2DS2-VASc Score of 4.  Continue Apixaban (Eliquis).   3. Obstructive sleep apnea Patient has not been on CPAP for several years.  4. CAD No unstable symptoms, mild CP has been typical with his afib in the past.     5. SVT Atypical AVNRT S/p ablation 2007   Follow up with Dr Camnitz in [place] post DCCV.    Ricky Nel Stoneking PA-C Afib Clinic [facility] [address] [place], [place] [address] [phone] 03/26/2022 8:35 AM  

## 2022-03-26 NOTE — Progress Notes (Signed)
Primary Care Physician: [name], MD Primary Cardiologist: none Primary Electrophysiologist: Dr Curt Bears Referring Physician: Dr Binnie Kand is a 72 y.o. male with a history of CAD, COPD, HLD, SVT, DM, OSA, hyperthyroidism, HTN, atrial flutter, atrial fibrillation who presents for follow up in the [facility]. Patient is s/p SVT ablation in 2007 and afib/flutter ablation in 2011 with Dr Rayann Heman. Patient is on Eliquis for a CHADS2VASC score of 4.  On follow up today, patient reports that for the last 6-8 weeks he has been having symptoms of palpitations, chest discomfort, intermittent dizziness, and labile heart rates. He is in afib today. He has been under stress lately after his brother passed away unexpectedly. No bleeding issues on anticoagulation.   Today, he denies symptoms of shortness of breath, orthopnea, PND, lower extremity edema, presyncope, syncope, snoring, daytime somnolence, bleeding, or neurologic sequela. The patient is tolerating medications without difficulties and is otherwise without complaint today.    Atrial Fibrillation Risk Factors:  he does have symptoms or diagnosis of sleep apnea. he is not compliant with CPAP therapy. he does not have a history of rheumatic fever. The patient does have a history of early familial atrial fibrillation or other arrhythmias.  he has a BMI of Body mass index is 32.01 kg/m.Marland Kitchen Filed Weights   03/26/22 0825  Weight: 107 kg    Family History  Problem Relation Age of Onset   Emphysema Mother    Heart attack Father        CVA   Hypertension Father    Diabetes Father    Heart disease Father        MI   Emphysema Maternal Grandfather        Smoker   Heart disease Paternal Grandfather    Colon cancer Neg Hx    Esophageal cancer Neg Hx    Inflammatory bowel disease Neg Hx    Liver disease Neg Hx    Pancreatic cancer Neg Hx    Rectal cancer Neg Hx    Stomach cancer Neg Hx       Atrial Fibrillation Management history:  Previous antiarrhythmic drugs: amiodarone Previous cardioversions: none Previous ablations: 2007 SVT, 2011 afib and flutter CHADS2VASC score: 4 Anticoagulation history: warfarin, Eliquis   Past Medical History:  Diagnosis Date   Anemia    Arthritis    Asthma    CAD (coronary artery disease)    a. S/P PCI mid LAD (vision stent) 08/31/2004;  b. repeat cath 6/11 - no progression of CAD. c. Cath 10/2012 given moderate CAD on cardiac CT - no change from prior cath.   Chest pain 12/16/2016   COPD (chronic obstructive pulmonary disease) ([place])    COVID-19 virus infection    Diverticulitis    DJD (degenerative joint disease) of lumbar spine    Dyslipidemia    Gallstones    GERD (gastroesophageal reflux disease)    History of kidney stones    Hypertension    Hyperthyroidism    Melanoma ([place])    melanoma removed from neck    On home oxygen therapy    "2L q hs" (11/08/2012),  06/04/19 pt. not on oxygen   OSA (obstructive sleep apnea)    a. cpap noncompliance- patient reports on 12/10/14- does not use CPAP   Palpitations    a. 10/2012 - s/p LINQ loop recorder to assess for arrhythmia.    Pancreatitis    Paroxysmal A-fib (HCC)  a. S/P PVI 2/11 and 9/11   PICC (peripherally inserted central catheter) in place    Pneumonia    SVT (supraventricular tachycardia)    a. Atypical AVNRT of the slow pathway   Type II diabetes mellitus [facility])    Past Surgical History:  Procedure Laterality Date   ATRIAL ABLATION SURGERY  2007   S/P slow pathway ablation for atypical AVNRT    BIOPSY  07/17/2018   Procedure: BIOPSY;  Surgeon: Irving Copas., MD;  Location: [facility] ENDOSCOPY;  Service: Gastroenterology;;   CHOLECYSTECTOMY  ~ 2008   COLON RESECTION  02/2013    COLONOSCOPY  12/25/2007   Colonic polyp status post polypectomy. Internal hemorrhoids.    CORONARY ANGIOPLASTY WITH STENT PLACEMENT  08/30/2004   Vision; to LAD    CYSTOSCOPY W/ STONE  MANIPULATION  ~ 1998   "once" (11/08/2012)   EP Study  2008   Negative EP study in [place]   ESOPHAGOGASTRODUODENOSCOPY (EGD) WITH PROPOFOL N/A 07/17/2018   Procedure: ESOPHAGOGASTRODUODENOSCOPY (EGD) WITH PROPOFOL;  Surgeon: Irving Copas., MD;  Location: [place];  Service: Gastroenterology;  Laterality: N/A;   FRACTURE SURGERY     LEFT HEART CATHETERIZATION WITH CORONARY ANGIOGRAM N/A 11/10/2012   Procedure: LEFT HEART CATHETERIZATION WITH CORONARY ANGIOGRAM;  Surgeon: Burnell Blanks, MD;  Location: [facility] CATH LAB;  Service: Cardiovascular;  Laterality: N/A;   LOOP RECORDER IMPLANT N/A 11/10/2012   Medtronic LinQ implanted by Dr Rayann Heman for afib management   LUMBAR [place]  2000; 2002   POSTERIOR LUMBAR FUSION  2004   PROSTATE SURGERY  ~ 2009   PVI/ CTI ablation  02/25/2009 and 09/2009   S/P afib/ CTI ablation   SPLENECTOMY, TOTAL N/A 06/07/2019   Procedure: SPLENECTOMY;  Surgeon: Stark Klein, MD;  Location: [place];  Service: General;  Laterality: N/A;   [place]   "broke in 3 places; had rod put in" (11/08/2012)   [place] Right ~ [place] Right 12/18/2014   Procedure: RIGHT TOTAL HIP ARTHROPLASTY ANTERIOR APPROACH;  Surgeon: Gaynelle Arabian, MD;  Location: [facility] ORS;  Service: Orthopedics;  Laterality: Right;   UPPER ESOPHAGEAL ENDOSCOPIC ULTRASOUND (EUS) N/A 07/17/2018   Procedure: UPPER ESOPHAGEAL ENDOSCOPIC ULTRASOUND (EUS);  Surgeon: Irving Copas., MD;  Location: [place];  Service: Gastroenterology;  Laterality: N/A;    Current Outpatient Medications  Medication Sig Dispense Refill   acetaminophen (TYLENOL) 500 MG tablet Take 1,000 mg by mouth every 6 (six) hours as needed for mild pain, fever or headache.     apixaban (ELIQUIS) 5 MG TABS tablet Take 1 tablet by mouth twice daily 60 tablet 6   baclofen (LIORESAL) 10 MG tablet Take 10 mg by mouth 3 (three) times  daily.     Cyanocobalamin (VITAMIN B-12 IJ) Inject 1,000 mcg as directed every 30 (thirty) days.     docusate sodium (COLACE) 100 MG capsule Take 1 capsule (100 mg total) by mouth 2 (two) times daily as needed for mild constipation. 10 capsule 0   escitalopram (LEXAPRO) 10 MG tablet Take 10 mg by mouth daily.  1   fenofibrate 160 MG tablet Take 160 mg by mouth daily.     ferrous sulfate 325 (65 FE) MG tablet Take 325 mg by mouth See admin instructions. Monday Wednesday Friday     furosemide (LASIX) 20 MG tablet Take 2 tablets by mouth daily. 1 tab sometimes     gabapentin (  NEURONTIN) 300 MG capsule Take 1 capsule (300 mg total) by mouth 3 (three) times daily. 90 capsule 0   glimepiride (AMARYL) 1 MG tablet Take 1 mg by mouth 2 (two) times daily.     Insulin Glargine (BASAGLAR KWIKPEN) 100 UNIT/ML Inject 30 Units into the skin daily.     Insulin Pen Needle 29G X 5MM MISC 1 Device by Does not apply route 3 (three) times daily as needed (for use with insulin pen). 100 each 0   JANUVIA 50 MG tablet Take 50 mg by mouth daily.     JARDIANCE 25 MG TABS tablet Take 25 mg by mouth daily.     losartan (COZAAR) 25 MG tablet Take 1/2 (one-half) tablet by mouth once daily 45 tablet 0   metFORMIN (GLUCOPHAGE) 500 MG tablet Take 500 mg by mouth 2 (two) times daily.     metoprolol tartrate (LOPRESSOR) 50 MG tablet Take 1 tablet by mouth once daily 90 tablet 2   nitroGLYCERIN (NITROSTAT) 0.4 MG SL tablet Place 0.4 mg under the tongue every 5 (five) minutes as needed for chest pain.     oxyCODONE-acetaminophen (PERCOCET) 10-325 MG tablet Take 1 tablet by mouth every 4 (four) hours.     pantoprazole (PROTONIX) 40 MG tablet Take 1 tablet (40 mg total) by mouth 2 (two) times daily. 180 tablet 3   Tamsulosin HCl (FLOMAX) 0.4 MG CAPS Take 0.4 mg by mouth daily.     traZODone (DESYREL) 50 MG tablet Take 100 mg by mouth at bedtime.      No current facility-administered medications for this encounter.    Allergies   Allergen Reactions   Penicillins Hives and Shortness Of Breath    Did it involve swelling of the face/tongue/throat, SOB, or low BP? Yes Did it involve sudden or severe rash/hives, skin peeling, or any reaction on the inside of your mouth or nose? No Did you need to seek medical attention at a hospital or doctor's office? Yes (emergency room) When did it last happen?  1974 If all above answers are "NO", may proceed with cephalosporin use.      Social History   Socioeconomic History   Marital status: Married    Spouse name: Not on file   Number of children: 1   Years of education: Not on file   Highest education level: Not on file  Occupational History   Occupation: DISABLED    Employer: DISABLED   Occupation: Retired [employer] Administrator  Tobacco Use   Smoking status: Former    Packs/day: 3.00    Years: 38.00    Total pack years: 114.00    Types: Cigarettes    Quit date: 03/25/2004    Years since quitting: 18.0   Smokeless tobacco: Former    Types: Chew    Quit date: 04/25/2004   Tobacco comments:    Former smoker 03/26/22  Vaping Use   Vaping Use: Never used  Substance and Sexual Activity   Alcohol use: Not Currently    Comment:  "quit drinking in 1989"   Drug use: No   Sexual activity: Not Currently  Other Topics Concern   Not on file  Social History Narrative   Resides in [place] with his wife   Two children and two grandchildren   Attends [employer]   Social Determinants of Health   Financial Resource Strain: Not on file  Food Insecurity: Not on file  Transportation Needs: Not on file  Physical Activity: Not on  file  Stress: Not on file  Social Connections: Not on file  Intimate Partner Violence: Not on file     ROS- All systems are reviewed and negative except as per the HPI above.  Physical Exam: Vitals:   03/26/22 0825  BP: (!) 128/98  Pulse: 93  SpO2: (!) 89%  Weight: 107 kg  Height: 6' (1.829 m)    GEN- The patient is a  well appearing male, alert and oriented x 3 today.   Head- normocephalic, atraumatic Eyes-  Sclera clear, conjunctiva pink Ears- hearing intact Oropharynx- clear Neck- supple  Lungs- Clear to ausculation bilaterally, normal work of breathing Heart- irregular rate and rhythm, no murmurs, rubs or gallops  GI- soft, NT, ND, + BS Extremities- no clubbing, cyanosis, or edema MS- no significant deformity or atrophy Skin- no rash or lesion Psych- euthymic mood, full affect Neuro- strength and sensation are intact  Wt Readings from Last 3 Encounters:  03/26/22 107 kg  10/21/21 105.2 kg  10/06/21 105.1 kg    EKG today demonstrates  Afib, slow R wave prog Vent. rate 93 BPM PR interval * ms QRS duration 84 ms QT/QTcB 350/435 ms  Echo 06/13/20 demonstrated   1. Left ventricular ejection fraction, by estimation, is 60 to 65%. The  left ventricle has normal function. The left ventricle has no regional  wall motion abnormalities. Left ventricular diastolic parameters are  indeterminate.   2. Right ventricular systolic function is normal. The right ventricular  size is normal.   3. Left atrial size was mildly dilated.   4. Right atrial size was mildly dilated.   5. The mitral valve is grossly normal. No evidence of mitral valve  regurgitation.   6. The aortic valve is grossly normal. Aortic valve regurgitation is not  visualized. No aortic stenosis is present.   7. Aortic dilatation noted. There is moderate dilatation of the ascending  aorta, measuring 46 mm. There is moderate dilatation of the aortic root,  measuring 46 mm.   8. The inferior vena cava is normal in size with greater than 50%  respiratory variability, suggesting right atrial pressure of 3 mmHg.   Comparison(s): A prior study was performed on 12/17/2016. Prior images  reviewed side by side. Increase in aortic root and ascending aortic  measurements from prior study.   Epic records are reviewed at length  today  CHA2DS2-VASc Score = 4  The patient's score is based upon: CHF History: 0 HTN History: 1 Diabetes History: 1 Stroke History: 0 Vascular Disease History: 1 Age Score: 1 Gender Score: 0       ASSESSMENT AND PLAN: 1. Persistent Atrial Fibrillation/atrial flutter The patient's CHA2DS2-VASc score is 4, indicating a 4.8% annual risk of stroke.   Patient appears to be in persistent afib. ? Related to recent passing of family member. We discussed rhythm control options today. After discussing the risks and benefits, will arrange for DCCV.  Continue Eliquis 5 mg BID Will increase Lopressor to 50 mg BID (had only been taking once daily) We did briefly discuss that if his afib returns quickly, he may be a candidate for repeat ablation.   2. Secondary Hypercoagulable State (ICD10:  D68.69) The patient is at significant risk for stroke/thromboembolism based upon his CHA2DS2-VASc Score of 4.  Continue Apixaban (Eliquis).   3. Obstructive sleep apnea Patient has not been on CPAP for several years.  4. CAD No unstable symptoms, mild CP has been typical with his afib in the past.  5. SVT Atypical AVNRT S/p ablation 2007   Follow up with Dr Curt Bears in [place] post DCCV.    [facility] [address] [place], [place] [address] [phone] 03/26/2022 8:35 AM

## 2022-03-26 NOTE — Addendum Note (Signed)
Encounter addended by: Juluis Mire, RN on: 03/26/2022 9:52 AM  Actions taken: Order list changed, Diagnosis association updated

## 2022-03-26 NOTE — Patient Instructions (Signed)
Increase Metoprolol '50mg'$  twice daily  Cardioversion scheduled for:   - Arrive at the [facility] and go to admitting at 10am March 19th   - Do not eat or drink anything after midnight the night prior to your procedure.   - Take all your morning medication (except diabetic medications: Insulin Kiwipen, Glimperide, Januia, Metformin) with a sip of water prior to arrival.  - You will not be able to drive home after your procedure.    - Do NOT miss any doses of your blood thinner - if you should miss a dose please notify our office immediately.   - If you feel as if you go back into normal rhythm prior to scheduled cardioversion, please notify our office immediately.   If your procedure is canceled in the cardioversion suite you will be charged a cancellation fee.

## 2022-04-09 ENCOUNTER — Ambulatory Visit (HOSPITAL_COMMUNITY)
Admission: RE | Admit: 2022-04-09 | Discharge: 2022-04-09 | Disposition: A | Discharge status: Home / Self Care | Payer: Medicare HMO | Source: Ambulatory Visit | Attending: Physician Assistant | Admitting: Physician Assistant

## 2022-04-09 DIAGNOSIS — I4891 Unspecified atrial fibrillation: Secondary | ICD-10-CM | POA: Insufficient documentation

## 2022-04-09 LAB — CBC
HCT: 42.5 % (ref 39.0–52.0)
Hemoglobin: 13 g/dL (ref 13.0–17.0)
MCH: 28.2 pg (ref 26.0–34.0)
MCHC: 30.6 g/dL (ref 30.0–36.0)
MCV: 92.2 fL (ref 80.0–100.0)
Platelets: 390 10*3/uL (ref 150–400)
RBC: 4.61 MIL/uL (ref 4.22–5.81)
RDW: 15.8 % — ABNORMAL HIGH (ref 11.5–15.5)
WBC: 9.1 10*3/uL (ref 4.0–10.5)
nRBC: 0 % (ref 0.0–0.2)

## 2022-04-09 LAB — BASIC METABOLIC PANEL
Anion gap: 9 (ref 5–15)
BUN: 13 mg/dL (ref 8–23)
CO2: 24 mmol/L (ref 22–32)
Calcium: 8.5 mg/dL — ABNORMAL LOW (ref 8.9–10.3)
Chloride: 102 mmol/L (ref 98–111)
Creatinine, Ser: 1.43 mg/dL — ABNORMAL HIGH (ref 0.61–1.24)
GFR, Estimated: 53 mL/min — ABNORMAL LOW (ref >60)
Glucose, Bld: 104 mg/dL — ABNORMAL HIGH (ref 70–99)
Potassium: 4.3 mmol/L (ref 3.5–5.1)
Sodium: 135 mmol/L (ref 135–145)

## 2022-04-13 ENCOUNTER — Other Ambulatory Visit: Payer: Self-pay

## 2022-04-13 ENCOUNTER — Ambulatory Visit (HOSPITAL_COMMUNITY)
Admission: RE | Admit: 2022-04-13 | Discharge: 2022-04-13 | Disposition: A | Discharge status: Home / Self Care | Payer: Medicare HMO | Attending: Internal Medicine | Admitting: Internal Medicine

## 2022-04-13 ENCOUNTER — Ambulatory Visit (HOSPITAL_COMMUNITY): Payer: Medicare HMO | Admitting: Anesthesiology

## 2022-04-13 ENCOUNTER — Encounter (HOSPITAL_COMMUNITY): Payer: Self-pay | Admitting: Internal Medicine

## 2022-04-13 ENCOUNTER — Encounter (HOSPITAL_COMMUNITY)
Admission: RE | Disposition: A | Discharge status: Home / Self Care | Payer: Self-pay | Source: Home / Self Care | Attending: Internal Medicine

## 2022-04-13 ENCOUNTER — Ambulatory Visit (HOSPITAL_BASED_OUTPATIENT_CLINIC_OR_DEPARTMENT_OTHER): Payer: Medicare HMO | Admitting: Anesthesiology

## 2022-04-13 DIAGNOSIS — J449 Chronic obstructive pulmonary disease, unspecified: Secondary | ICD-10-CM

## 2022-04-13 DIAGNOSIS — D649 Anemia, unspecified: Secondary | ICD-10-CM | POA: Diagnosis not present

## 2022-04-13 DIAGNOSIS — I471 Supraventricular tachycardia, unspecified: Secondary | ICD-10-CM | POA: Insufficient documentation

## 2022-04-13 DIAGNOSIS — Z955 Presence of coronary angioplasty implant and graft: Secondary | ICD-10-CM | POA: Insufficient documentation

## 2022-04-13 DIAGNOSIS — Z7901 Long term (current) use of anticoagulants: Secondary | ICD-10-CM | POA: Insufficient documentation

## 2022-04-13 DIAGNOSIS — I4892 Unspecified atrial flutter: Secondary | ICD-10-CM | POA: Insufficient documentation

## 2022-04-13 DIAGNOSIS — K219 Gastro-esophageal reflux disease without esophagitis: Secondary | ICD-10-CM | POA: Diagnosis not present

## 2022-04-13 DIAGNOSIS — I2510 Atherosclerotic heart disease of native coronary artery without angina pectoris: Secondary | ICD-10-CM | POA: Diagnosis not present

## 2022-04-13 DIAGNOSIS — D759 Disease of blood and blood-forming organs, unspecified: Secondary | ICD-10-CM | POA: Insufficient documentation

## 2022-04-13 DIAGNOSIS — M1990 Unspecified osteoarthritis, unspecified site: Secondary | ICD-10-CM | POA: Insufficient documentation

## 2022-04-13 DIAGNOSIS — I10 Essential (primary) hypertension: Secondary | ICD-10-CM | POA: Diagnosis not present

## 2022-04-13 DIAGNOSIS — E119 Type 2 diabetes mellitus without complications: Secondary | ICD-10-CM | POA: Insufficient documentation

## 2022-04-13 DIAGNOSIS — Z7984 Long term (current) use of oral hypoglycemic drugs: Secondary | ICD-10-CM | POA: Insufficient documentation

## 2022-04-13 DIAGNOSIS — K7460 Unspecified cirrhosis of liver: Secondary | ICD-10-CM | POA: Diagnosis not present

## 2022-04-13 DIAGNOSIS — Z87891 Personal history of nicotine dependence: Secondary | ICD-10-CM | POA: Diagnosis not present

## 2022-04-13 DIAGNOSIS — I4819 Other persistent atrial fibrillation: Secondary | ICD-10-CM | POA: Diagnosis not present

## 2022-04-13 DIAGNOSIS — I4891 Unspecified atrial fibrillation: Secondary | ICD-10-CM | POA: Diagnosis not present

## 2022-04-13 DIAGNOSIS — D6869 Other thrombophilia: Secondary | ICD-10-CM | POA: Insufficient documentation

## 2022-04-13 DIAGNOSIS — G4730 Sleep apnea, unspecified: Secondary | ICD-10-CM

## 2022-04-13 DIAGNOSIS — G4733 Obstructive sleep apnea (adult) (pediatric): Secondary | ICD-10-CM | POA: Diagnosis not present

## 2022-04-13 HISTORY — PX: CARDIOVERSION: SHX1299

## 2022-04-13 SURGERY — CARDIOVERSION
Anesthesia: General

## 2022-04-13 MED ORDER — PROPOFOL 10 MG/ML IV BOLUS
INTRAVENOUS | Status: DC | PRN
  Administered 2022-04-13: 100 mg via INTRAVENOUS

## 2022-04-13 MED ORDER — OXYCODONE HCL 5 MG/5ML PO SOLN: 5.0000 mg | Freq: Once | ORAL | Status: DC | PRN

## 2022-04-13 MED ORDER — LIDOCAINE HCL (CARDIAC) PF 100 MG/5ML IV SOSY
PREFILLED_SYRINGE | INTRAVENOUS | Status: DC | PRN
  Administered 2022-04-13: 100 mg via INTRAVENOUS

## 2022-04-13 MED ORDER — SODIUM CHLORIDE 0.9 % IV SOLN: INTRAVENOUS | Status: DC

## 2022-04-13 MED ORDER — PROMETHAZINE HCL 25 MG/ML IJ SOLN: 6.2500 mg | INTRAMUSCULAR | Status: DC | PRN

## 2022-04-13 MED ORDER — HYDROMORPHONE HCL 1 MG/ML IJ SOLN: 0.2500 mg | INTRAMUSCULAR | Status: DC | PRN

## 2022-04-13 MED ORDER — OXYCODONE HCL 5 MG PO TABS: 5.0000 mg | ORAL_TABLET | Freq: Once | ORAL | Status: DC | PRN

## 2022-04-13 NOTE — CV Procedure (Signed)
    CARDIOVERSION NOTE  Procedure: Electrical Cardioversion Indications:  Atrial Fibrillation  Procedure Details:  Consent: Risks of procedure as well as the alternatives and risks of each were explained to the (patient/caregiver).  Consent for procedure obtained.  Time Out: Verified patient identification, verified procedure, site/side was marked, verified correct patient position, special equipment/implants available, medications/allergies/relevent history reviewed, required imaging and test results available.  Performed  Patient placed on cardiac monitor, pulse oximetry, supplemental oxygen as necessary.  Sedation given:  propofol per anesthesia Pacer pads placed anterior and posterior chest.  Cardioverted 2 time(s).  Cardioverted at 200J biphasic x 2 with pressure.  Impression: Findings: Post procedure EKG shows:  sinus bradycardia with PAC's Complications: None Patient did tolerate procedure well.  Plan: Successful DCCV with 2 stacked shocks to sinus bradycardia with PAC's.  Time Spent Directly with the Patient:  30 minutes   Pixie Casino, MD, [facility], [place] Director of the Advanced Lipid Disorders &  Cardiovascular Risk Reduction Clinic Diplomate of the American Board of Clinical Lipidology Attending Cardiologist  Direct Dial: [phone]  Fax: [phone]  Website:  [email or web address] Brithany Whitworth 04/13/2022, 11:49 AM

## 2022-04-13 NOTE — Interval H&P Note (Signed)
History and Physical Interval Note:  04/13/2022 10:42 AM  Steve Anthony  has presented today for surgery, with the diagnosis of AFIB.  The various methods of treatment have been discussed with the patient and family. After consideration of risks, benefits and other options for treatment, the patient has consented to  Procedure(s): CARDIOVERSION (N/A) as a surgical intervention.  The patient's history has been reviewed, patient examined, no change in status, stable for surgery.  I have reviewed the patient's chart and labs.  Questions were answered to the patient's satisfaction.     Pixie Casino

## 2022-04-13 NOTE — Discharge Instructions (Signed)

## 2022-04-13 NOTE — Transfer of Care (Signed)
Immediate Anesthesia Transfer of Care Note  Patient: Steve Anthony  Procedure(s) Performed: CARDIOVERSION  Patient Location: PACU and Endoscopy Unit  Anesthesia Type:General  Level of Consciousness: drowsy and patient cooperative  Airway & Oxygen Therapy: Patient Spontanous Breathing and Patient connected to face mask oxygen  Post-op Assessment: Report given to RN and Post -op Vital signs reviewed and stable  Post vital signs: Reviewed and stable  Last Vitals:  Vitals Value Taken Time  BP 104/77 04/13/22 1124  Temp    Pulse 48 04/13/22 1125  Resp 26 04/13/22 1125  SpO2 89 % 04/13/22 1125  Vitals shown include unvalidated device data.  Last Pain:  Vitals:   04/13/22 1013  TempSrc: Tympanic  PainSc: 5       Patients Stated Pain Goal: 2 ([id number])  Complications: No notable events documented.

## 2022-04-13 NOTE — Anesthesia Postprocedure Evaluation (Signed)
Anesthesia Post Note  Patient: Steve Anthony  Procedure(s) Performed: CARDIOVERSION     Patient location during evaluation: PACU Anesthesia Type: General Level of consciousness: awake and alert Pain management: pain level controlled Vital Signs Assessment: post-procedure vital signs reviewed and stable Respiratory status: spontaneous breathing, nonlabored ventilation and respiratory function stable Cardiovascular status: blood pressure returned to baseline and stable Postop Assessment: no apparent nausea or vomiting Anesthetic complications: no   No notable events documented.  Last Vitals:  Vitals:   04/13/22 1136 04/13/22 1146  BP: 111/88 104/82  Pulse: 63 66  Resp: 15 17  Temp:    SpO2: 92% 91%    Last Pain:  Vitals:   04/13/22 1146  TempSrc:   PainSc: [name]

## 2022-04-13 NOTE — Anesthesia Preprocedure Evaluation (Signed)
Anesthesia Evaluation  Patient identified by MRN, date of birth, ID band Patient awake    Reviewed: Allergy & Precautions, NPO status , Patient's Chart, lab work & pertinent test results, reviewed documented beta blocker date and time   History of Anesthesia Complications Negative for: history of anesthetic complications  Airway Mallampati: II  TM Distance: >3 FB Neck ROM: Full    Dental  (+) Edentulous Upper, Edentulous Lower   Pulmonary asthma , sleep apnea (untreated) , COPD, former smoker   Pulmonary exam normal        Cardiovascular hypertension, Pt. on home beta blockers and Pt. on medications + CAD and + Cardiac Stents (2006)  Normal cardiovascular exam+ dysrhythmias (on Eliquis) Atrial Fibrillation   TTE 2018: EF 55-60%, mild LAE   Neuro/Psych negative neurological ROS  negative psych ROS   GI/Hepatic ,GERD  Medicated and Controlled,,(+) Cirrhosis       Pancreatitis   Endo/Other  diabetes, Type 2, Oral Hypoglycemic Agents    Renal/GU Renal InsufficiencyRenal disease (Cr 1.6, K 5.9)  negative genitourinary   Musculoskeletal  (+) Arthritis , Osteoarthritis,    Abdominal   Peds  Hematology  (+) Blood dyscrasia, anemia Hgb 10.0, Plt 88   Anesthesia Other Findings Day of surgery medications reviewed with patient.  Reproductive/Obstetrics negative OB ROS                             Anesthesia Physical Anesthesia Plan  ASA: 3  Anesthesia Plan: General   Post-op Pain Management: Minimal or no pain anticipated   Induction: Intravenous  PONV Risk Score and Plan: 2 and Treatment may vary due to age or medical condition and Propofol infusion  Airway Management Planned: Nasal Cannula  Additional Equipment:   Intra-op Plan:   Post-operative Plan: Possible Post-op intubation/ventilation  Informed Consent: I have reviewed the patients History and Physical, chart, labs and  discussed the procedure including the risks, benefits and alternatives for the proposed anesthesia with the patient or authorized representative who has indicated his/her understanding and acceptance.     Dental advisory given  Plan Discussed with: CRNA  Anesthesia Plan Comments: ()        Anesthesia Quick Evaluation

## 2022-04-14 ENCOUNTER — Encounter (HOSPITAL_COMMUNITY): Payer: Self-pay | Admitting: Internal Medicine

## 2022-04-19 ENCOUNTER — Telehealth: Payer: Self-pay | Admitting: Cardiology

## 2022-04-19 NOTE — Telephone Encounter (Signed)
Returned call to patient who states he has been having chest tightness since cardioversion on 04/13/22. He is on 2L [place] continuously and is not having to increase, states his breathing feels normal. Chest tightness is over left breast area, "little pressure" that he states gets a little worse when he gets up and moves around. His HR at rest is around 70-85bpm, but walking from one end of house to other makes his HR rise to 109bpm, which he says is abnormal since procedure. States he used to be able to walk 70-80 yards and back without feeling tired out and HR rising. Was resting in chair night following procedure and describes a sudden "burning, up, like I was on fire, I went to sweating, then I cooled off after a few minutes and my heart was still beating pretty fast and chest was hurting." Checked his HR on his pulse oximeter and HR was 138bpm, but thought it was because he didn't have his oxygen on. He states he could have done this same activity prior to DCCV and not had an issue. Denies feeling any sweating since that episode. At this time, condones only "mild pressure" in his chest, states it's still a little sore since the procedure, denies arm/back/jaw pain. BP is 115/84. Says he can feel when he is in A-fib and at time of call, doesn't feel like he his, but states with any bit of exertion he can tell he goes back in A-fib and is symptomatic. Will route to Primary RN for review/address.

## 2022-04-19 NOTE — Telephone Encounter (Signed)
Pt scheduled to see Dr. Curt Bears tomorrow for further evaluation/discussion. Pt agreeable to plan.

## 2022-04-19 NOTE — Telephone Encounter (Signed)
Patient c/o Palpitations:  High priority if patient c/o lightheadedness, shortness of breath, or chest pain  How long have you had palpitations/irregular HR/ Afib? Are you having the symptoms now? Started 03/20 and it has been coming and going  Are you currently experiencing lightheadedness, SOB or CP? Pt stated they are feeling a little pressure on their chest   Do you have a history of afib (atrial fibrillation) or irregular heart rhythm? Yes  Have you checked your BP or HR? (document readings if available): BP 136/111 HR 99 - Last Night and BP 115/84 HR 76 Right Now  Are you experiencing any other symptoms? Pt stated they are feeling little Chest Pain and Pt stated they are on oxygen.

## 2022-04-20 ENCOUNTER — Encounter: Payer: Self-pay | Admitting: Cardiology

## 2022-04-20 ENCOUNTER — Ambulatory Visit: Payer: Medicare HMO | Attending: Cardiology | Admitting: Cardiology

## 2022-04-20 VITALS — BP 110/80 | HR 133 | Ht 72.0 in | Wt 243.0 lb

## 2022-04-20 DIAGNOSIS — D6869 Other thrombophilia: Secondary | ICD-10-CM | POA: Diagnosis not present

## 2022-04-20 DIAGNOSIS — I4819 Other persistent atrial fibrillation: Secondary | ICD-10-CM

## 2022-04-20 DIAGNOSIS — I10 Essential (primary) hypertension: Secondary | ICD-10-CM

## 2022-04-20 DIAGNOSIS — I25119 Atherosclerotic heart disease of native coronary artery with unspecified angina pectoris: Secondary | ICD-10-CM

## 2022-04-20 NOTE — Patient Instructions (Signed)
Medication Instructions:  Your physician recommends that you continue on your current medications as directed. Please refer to the Current Medication list given to you today.  *If you need a refill on your cardiac medications before your next appointment, please call your pharmacy*   Follow-Up: At [facility], you and your health needs are our priority.  As part of our continuing mission to provide you with exceptional heart care, we have created designated Provider Care Teams.  These Care Teams include your primary Cardiologist (physician) and Advanced Practice Providers (APPs -  Physician Assistants and Nurse Practitioners) who all work together to provide you with the care you need, when you need it.  We recommend signing up for the patient portal called "MyChart".  Sign up information is provided on this After Visit Summary.  MyChart is used to connect with patients for Virtual Visits (Telemedicine).  Patients are able to view lab/test results, encounter notes, upcoming appointments, etc.  Non-urgent messages can be sent to your provider as well.   To learn more about what you can do with MyChart, go to [email or web address].    Your next appointment:   To Be Determined  Provider:   Allegra Lai, MD   Other Instructions Dofetilide (Tikosyn) Admission  Prior to day of admission: Check with drug insurance company for cost of drug to ensure affordability --- Dofetilide 500 mcg twice a day.  GoodRx is an option if insurance copay is unaffordable.  A pharmacist will review all your medications for potential interactions with Tikosyn. If any medication changes are needed prior to admission we will be in touch with you.  If any new medications are started AFTER your admission date is set with [place] [facility] [phone]). Please notify our office immediately so your medication list can be updated and reviewed by our pharmacist again.  No Benadryl is allowed 3 days prior to  admission.  Please ensure no missed doses of your anticoagulation (blood thinner) for 3 weeks prior to admission. If a dose is missed please notify our office immediately.  Tikosyn initiation requires a 3 night/4 day hospital stay with constant telemetry monitoring. You will have an EKG after each dose of Tikosyn as well as daily lab draws. On day of admission: Afib Clinic office visit on the morning of admission is needed for preliminary labs/ekg.  You may bring personal belongings/clothing with you to the hospital. Please leave your suitcase in the car until you arrive in admissions.  Time of admission is dependent on bed availability in the hospital. In some instances, you will be sent home until bed is available. Rarely admission can be delayed to the following day if hospital census prevents available beds.  If the drug does not convert you to normal rhythm a cardioversion after the 4th dose of Tikosyn.  Questions please call our office at [phone]

## 2022-04-20 NOTE — Progress Notes (Signed)
Electrophysiology Office Note   Date:  04/20/2022   ID:  Key, Brule 1950-02-12, MRN [id number]  PCP:  [name], MD  Cardiologist:   Primary Electrophysiologist:  Chelsea Pedretti Meredith Leeds, MD    Chief Complaint: AF   History of Present Illness: Steve Anthony is a 72 y.o. male who is being seen today for the evaluation of AF at the request of [name], MD. Presenting today for electrophysiology evaluation.  He has a history significant for coronary artery disease status post PCI to the LAD, COPD, hyperlipidemia, hypertension, atrial fibrillation.  He had A-fib ablation in 2011.  He is continued to have more frequent episodes of atrial fibrillation.  He had a recent cardioversion but has gone back into atrial fibrillation.  His primary physician has put him on oxygen for his COPD.  He feels tired, fatigued, short of breath and atrial fibrillation.  Today, denies symptoms of palpitations, chest pain, orthopnea, PND, lower extremity edema, claudication, dizziness, presyncope, syncope, bleeding, or neurologic sequela. The patient is tolerating medications without difficulties.     Past Medical History:  Diagnosis Date   Anemia    Arthritis    Asthma    CAD (coronary artery disease)    a. S/P PCI mid LAD (vision stent) 08/31/2004;  b. repeat cath 6/11 - no progression of CAD. c. Cath 10/2012 given moderate CAD on cardiac CT - no change from prior cath.   Chest pain 12/16/2016   COPD (chronic obstructive pulmonary disease) ([place])    COVID-19 virus infection    Diverticulitis    DJD (degenerative joint disease) of lumbar spine    Dyslipidemia    Gallstones    GERD (gastroesophageal reflux disease)    History of kidney stones    Hypertension    Hyperthyroidism    Melanoma ([place])    melanoma removed from neck    On home oxygen therapy    "2L q hs" (11/08/2012),  06/04/19 pt. not on oxygen   OSA (obstructive sleep apnea)    a. cpap noncompliance- patient reports on  12/10/14- does not use CPAP   Palpitations    a. 10/2012 - s/p LINQ loop recorder to assess for arrhythmia.    Pancreatitis    Paroxysmal A-fib ([place])    a. S/P PVI 2/11 and 9/11   PICC (peripherally inserted central catheter) in place    Pneumonia    SVT (supraventricular tachycardia)    a. Atypical AVNRT of the slow pathway   Type II diabetes mellitus [facility])    Past Surgical History:  Procedure Laterality Date   ATRIAL ABLATION SURGERY  2007   S/P slow pathway ablation for atypical AVNRT    BIOPSY  07/17/2018   Procedure: BIOPSY;  Surgeon: Irving Copas., MD;  Location: [place];  Service: Gastroenterology;;   CARDIOVERSION N/A 04/13/2022   Procedure: CARDIOVERSION;  Surgeon: Pixie Casino, MD;  Location[facility] ENDOSCOPY;  Service: Cardiovascular;  Laterality: N/A;   CHOLECYSTECTOMY  ~ 2008   COLON RESECTION  02/2013    COLONOSCOPY  12/25/2007   Colonic polyp status post polypectomy. Internal hemorrhoids.    CORONARY ANGIOPLASTY WITH STENT PLACEMENT  08/30/2004   Vision; to LAD    CYSTOSCOPY W/ STONE MANIPULATION  ~ 1998   "once" (11/08/2012)   EP Study  2008   Negative EP study in [place]   ESOPHAGOGASTRODUODENOSCOPY (EGD) WITH PROPOFOL N/A 07/17/2018   Procedure: ESOPHAGOGASTRODUODENOSCOPY (EGD) WITH PROPOFOL;  Surgeon: Irving Copas.,  MD;  Location: [place];  Service: Gastroenterology;  Laterality: N/A;   FRACTURE SURGERY     LEFT HEART CATHETERIZATION WITH CORONARY ANGIOGRAM N/A 11/10/2012   Procedure: LEFT HEART CATHETERIZATION WITH CORONARY ANGIOGRAM;  Surgeon: Burnell Blanks, MD;  Location[facility] C[facility] LAB;  Service: Cardiovascular;  Laterality: N/A;   LOOP RECORDER IMPLANT N/A 11/10/2012   Medtronic LinQ implanted by Dr Rayann Heman for afib management   LUMBAR [place]  2000; 2002   POSTERIOR LUMBAR FUSION  2004   PROSTATE SURGERY  ~ 2009   PVI/ CTI ablation  02/25/2009 and 09/2009   S/P afib/ CTI ablation   SPLENECTOMY, TOTAL N/A 06/07/2019    Procedure: SPLENECTOMY;  Surgeon: Stark Klein, MD;  Location: [place];  Service: General;  Laterality: N/A;   [place]   "broke in 3 places; had rod put in" (11/08/2012)   [place] Right ~ [place] Right 12/18/2014   Procedure: RIGHT TOTAL HIP ARTHROPLASTY ANTERIOR APPROACH;  Surgeon: Gaynelle Arabian, MD;  Location: [facility];  Service: Orthopedics;  Laterality: Right;   UPPER ESOPHAGEAL ENDOSCOPIC ULTRASOUND (EUS) N/A 07/17/2018   Procedure: UPPER ESOPHAGEAL ENDOSCOPIC ULTRASOUND (EUS);  Surgeon: Irving Copas., MD;  Location: [place];  Service: Gastroenterology;  Laterality: N/A;     Current Outpatient Medications  Medication Sig Dispense Refill   acetaminophen (TYLENOL) 500 MG tablet Take 1,000 mg by mouth every 6 (six) hours as needed for mild pain, fever or headache.     apixaban (ELIQUIS) 5 MG TABS tablet Take 1 tablet by mouth twice daily 60 tablet 6   baclofen (LIORESAL) 10 MG tablet Take 10 mg by mouth at bedtime.     Cyanocobalamin (VITAMIN B-12 IJ) Inject 1,000 mcg as directed every 30 (thirty) days.     docusate sodium (COLACE) 100 MG capsule Take 1 capsule (100 mg total) by mouth 2 (two) times daily as needed for mild constipation. (Patient taking differently: Take 100 mg by mouth 2 (two) times daily.) 10 capsule 0   escitalopram (LEXAPRO) 10 MG tablet Take 10 mg by mouth daily.  1   fenofibrate 160 MG tablet Take 160 mg by mouth daily.     ferrous sulfate 325 (65 FE) MG tablet Take 325 mg by mouth daily with breakfast.     furosemide (LASIX) 20 MG tablet Take 40 mg by mouth daily as needed for fluid or edema.     gabapentin (NEURONTIN) 300 MG capsule Take 1 capsule (300 mg total) by mouth 3 (three) times daily. 90 capsule 0   Insulin Glargine (BASAGLAR KWIKPEN) 100 UNIT/ML Inject 35 Units into the skin in the morning.     Insulin Pen Needle 29G X 5MM MISC 1 Device by Does not apply route 3  (three) times daily as needed (for use with insulin pen). 100 each 0   JARDIANCE 25 MG TABS tablet Take 25 mg by mouth daily.     losartan (COZAAR) 25 MG tablet Take 1/2 (one-half) tablet by mouth once daily 45 tablet 0   Melatonin 10 MG TABS Take 10 mg by mouth at bedtime.     metFORMIN (GLUCOPHAGE) 1000 MG tablet Take 1,000 mg by mouth 2 (two) times daily.     metoprolol tartrate (LOPRESSOR) 50 MG tablet Take 1 tablet (50 mg total) by mouth 2 (two) times daily. 180 tablet 2   Multiple Vitamins-Minerals (PRESERVISION AREDS) TABS Take 1 tablet by mouth  daily.     nitroGLYCERIN (NITROSTAT) 0.4 MG SL tablet Place 0.4 mg under the tongue every 5 (five) minutes as needed for chest pain.     Oxycodone HCl 10 MG TABS Take 10 mg by mouth as needed.     oxyCODONE-acetaminophen (PERCOCET) 10-325 MG tablet Take 1 tablet by mouth every 4 (four) hours.     OXYGEN Inhale 2 L into the lungs continuous.     pantoprazole (PROTONIX) 40 MG tablet Take 1 tablet (40 mg total) by mouth 2 (two) times daily. 180 tablet 3   Tamsulosin HCl (FLOMAX) 0.4 MG CAPS Take 0.4 mg by mouth daily.     traZODone (DESYREL) 50 MG tablet Take 100 mg by mouth at bedtime.      No current facility-administered medications for this visit.    Allergies:   Penicillins   Social History:  The patient  reports that he quit smoking about 18 years ago. His smoking use included cigarettes. He has a 114.00 pack-year smoking history. He quit smokeless tobacco use about 17 years ago.  His smokeless tobacco use included chew. He reports that he does not currently use alcohol. He reports that he does not use drugs.   Family History:  The patient's family history includes Diabetes in his father; Emphysema in his maternal grandfather and mother; Heart attack in his father; Heart disease in his father and paternal grandfather; Hypertension in his father.   ROS:  Please see the history of present illness.   Otherwise, review of systems is positive for  none.   All other systems are reviewed and negative.   PHYSICAL EXAM: VS:  BP 110/80   Pulse (!) 133   Ht 6' (1.829 m)   Wt 243 lb (110.2 kg)   SpO2 90%   BMI 32.96 kg/m  , BMI Body mass index is 32.96 kg/m. GEN: Well nourished, well developed, in no acute distress  HEENT: normal  Neck: no JVD, carotid bruits, or masses Cardiac: irregular; no murmurs, rubs, or gallops,no edema  Respiratory:  clear to auscultation bilaterally, normal work of breathing GI: soft, nontender, nondistended, + BS MS: no deformity or atrophy  Skin: warm and dry Neuro:  Strength and sensation are intact Psych: euthymic mood, full affect  EKG:  EKG is ordered today. Personal review of the ekg ordered shows atrial fibrillation   Recent Labs: 04/09/2022: BUN 13; Creatinine, Ser 1.43; Hemoglobin 13.0; Platelets 390; Potassium 4.3; Sodium 135    Lipid Panel     Component Value Date/Time   CHOL 113 02/20/2019 2042   TRIG 276 (H) 02/20/2019 2042   HDL 20 (L) 02/20/2019 2042   CHOLHDL 5.7 02/20/2019 2042   VLDL 55 (H) 02/20/2019 2042   LDLCALC 38 02/20/2019 2042     Wt Readings from Last 3 Encounters:  04/20/22 243 lb (110.2 kg)  04/13/22 232 lb (105.2 kg)  03/26/22 236 lb (107 kg)      Other studies Reviewed: Additional studies/ records that were reviewed today include: TTE 06/13/20  Review of the above records today demonstrates:   1. Left ventricular ejection fraction, by estimation, is 60 to 65%. The  left ventricle has normal function. The left ventricle has no regional  wall motion abnormalities. Left ventricular diastolic parameters are  indeterminate.   2. Right ventricular systolic function is normal. The right ventricular  size is normal.   3. Left atrial size was mildly dilated.   4. Right atrial size was mildly dilated.   5.  The mitral valve is grossly normal. No evidence of mitral valve  regurgitation.   6. The aortic valve is grossly normal. Aortic valve regurgitation is not   visualized. No aortic stenosis is present.   7. Aortic dilatation noted. There is moderate dilatation of the ascending  aorta, measuring 46 mm. There is moderate dilatation of the aortic root,  measuring 46 mm.   8. The inferior vena cava is normal in size with greater than 50%  respiratory variability, suggesting right atrial pressure of 3 mmHg.   Coronary CT 07/17/2020 1. Unable to determine coronary calcium score due to LAD stent, but total calcium score in RCA and LCX is 1316, which would be 91st percentile for age and sex matched control even with excluding the LAD 2. Normal coronary origin with right dominance. 3. Calcified plaque in the mid LCX appears to cause severe (70-99%) stenosis, though may be overestimating due to blooming artifact from significant calcifications. Unable to send for CTFFR due to LAD stent. Recommend stress testing to evaluate for ischemia. 4. Mid LAD stent appears patent. There is an area of low attenuation suggesting in-stent restenosis but does not appear to be causing significant stenosis. 5. Calcified plaque causing mild (25-49%) stenosis in the proximal/mid/distal RCA, proximal LCX, and proximal LAD 6.  Dilated ascending aorta measures 38mm  Myoview 11/21/2020   The study is normal. The study is low risk.   No ST deviation was noted.   LV perfusion is normal. There is no evidence of ischemia. There is no evidence of infarction.   Left ventricular function is normal. Nuclear stress EF: 63 %. The left ventricular ejection fraction is normal (55-65%). End diastolic cavity size is normal. End systolic cavity size is normal.   Prior study available for comparison from 12/17/2016. No changes compared to prior study.  ASSESSMENT AND PLAN:  1.  Persistent atrial fibrillation: Status post ablation in 2011.  CHA2DS2-VASc of 4.  Currently on Eliquis.  He is back in atrial fibrillation very quickly after his most recent cardioversion.  He is feeling poorly  with fatigue and shortness of breath.  He potentially be an ablation candidate, but he is on oxygen for his COPD.  Would like to avoid amiodarone.  Steve Anthony plan for dofetilide.  2.  Hypertension: Currently well-controlled  3.  Coronary artery disease: Stable angina.  Stent to the LAD.  No ischemia on most recent stress test.  4.  Ascending aortic aneurysm: Followed by CT CS.  5.  Second hypercoagulable state: Currently on Eliquis for atrial fibrillation   Current medicines are reviewed at length with the patient today.   The patient does not have concerns regarding his medicines.  The following changes were made today:  none  Labs/ tests ordered today include:  Orders Placed This Encounter  Procedures   EKG 12-Lead     Disposition:   FU with Steve Anthony 3 months  Signed, Kahealani Yankovich Meredith Leeds, MD  04/20/2022 5:08 PM     [address] [place] [address] [phone] (office) [phone] (fax)

## 2022-04-21 ENCOUNTER — Telehealth: Payer: Self-pay | Admitting: Cardiology

## 2022-04-21 ENCOUNTER — Telehealth: Payer: Self-pay | Admitting: Pharmacist

## 2022-04-21 NOTE — Telephone Encounter (Signed)
Medication list reviewed in anticipation of upcoming Tikosyn initiation. Patient is taking metformin which can increase concentrations of Tikosyn but is ok to continue. He does take trazodone and Lexapro, both of which are QTc prolonging. Lexapro is more QTc prolonging than other SSRIs. Would see if he's able to reach out to PCP for potential alternative medications to both trazodone and Lexapro. There is a separate phone note from today stating that [place] is too expensive for pt, would confirm that financially he's able to start on med before changing his other meds.  Patient is anticoagulated on Eliquis 5mg  BID on the appropriate dose. Please ensure that patient has not missed any anticoagulation doses in the 3 weeks prior to Tikosyn initiation.   Patient will need to be counseled to avoid use of Benadryl while on Tikosyn and in the 2-3 days prior to Tikosyn initiation.

## 2022-04-21 NOTE — Telephone Encounter (Signed)
Pt c/o medication issue:  1. Name of Medication: Dofetilide  2. How are you currently taking this medication (dosage and times per day)?   3. Are you having a reaction (difficulty breathing--STAT)? No  4. What is your medication issue? Patient stated that he is supposed to start this medication. When the patient went to go pick it up, he stated it was too expensive for him. Patient called his [employer], they stated that they were going to send a fax sent over to [place]. The fax needs to be filled out once we received it. [employer] told the patient it could take 7-14 days before they could help him with the pricing of the medication.

## 2022-04-21 NOTE — Telephone Encounter (Signed)
Pt reports insurance quoted him $126/90 days. Pt cannot afford this. He stated that insurance is sending something to [place] to try and get it for cheaper Louetta Hollingshead. I assume he is referring to PA or tier exception but pt could not tell me exactly what they were sending to [place], only that it was for a cheaper cost. Pt aware I am forwarding to our PA nurse to review/be on look out for insurance request. Aware office will follow up once documentation received. (Pt also aware I will not be in the office next week, and that MD is only in office Wednesday, so this may take some time)

## 2022-04-22 NOTE — Telephone Encounter (Signed)
**Note De-Identified Miliyah Luper Obfuscation** We received a very detailed Dofetilide tier exception form from [facility] with a letter attached stating that we can complete the form and fax it back to them or to call them at [phone] to do the PA over the phone.  I called [employer] and did the Dofetilide PA over the phone with Sasha. Per Darrol Jump, they will fax [place] and will mail the pt a determination letter within 24 to 72 hours.

## 2022-04-26 ENCOUNTER — Ambulatory Visit: Payer: Medicare HMO | Admitting: Cardiology

## 2022-04-28 ENCOUNTER — Telehealth: Payer: Self-pay | Admitting: Cardiology

## 2022-04-28 NOTE — Telephone Encounter (Signed)
Pt c/o medication issue:  1. Name of Medication:  Dofetilide  2. How are you currently taking this medication (dosage and times per day)?   3. Are you having a reaction (difficulty breathing--STAT)?   4. What is your medication issue?   Patient states Dofetilide has been approved and he would like a call back to discuss. He mentions that he will need a prescription ASAP.

## 2022-04-28 NOTE — Telephone Encounter (Signed)
**Note De-Identified Steve Anthony Obfuscation** Letter received from [facility] stating that they have approve the pts Dofetilide tier exception until 01/25/2023 and that it is now at a tier 2.  I have notified the pt of this approval and he thanked me for my assistance.

## 2022-04-29 NOTE — Telephone Encounter (Signed)
Pt to contact PCP regarding changing lexapro to different agent and pain clinic physician regarding trazodone change. Pt will call with update then we will schedule admission.

## 2022-04-29 NOTE — Telephone Encounter (Signed)
Will call pt to discuss [place] admission now that pt can afford medication.

## 2022-04-29 NOTE — Telephone Encounter (Signed)
Pt understands will not receive prescription for dofetilide until after hospitalization.

## 2022-05-03 DIAGNOSIS — D509 Iron deficiency anemia, unspecified: Secondary | ICD-10-CM | POA: Insufficient documentation

## 2022-05-03 HISTORY — DX: Iron deficiency anemia, unspecified: D50.9

## 2022-05-06 MED ORDER — ZOLPIDEM TARTRATE 5 MG PO TABS: 2.5000 mg | ORAL_TABLET | Freq: Every evening | ORAL | 0 refills | Status: DC | PRN

## 2022-05-06 NOTE — Addendum Note (Signed)
Addended by: Shona Simpson on: 05/06/2022 10:04 AM   Modules accepted: Orders

## 2022-05-06 NOTE — Telephone Encounter (Signed)
Pt off lexapro and trazodone per pcp. Ambien prescribed for sleep.He is not replacing lexapro at this time.

## 2022-05-11 ENCOUNTER — Ambulatory Visit (HOSPITAL_COMMUNITY): Payer: Medicare HMO | Admitting: Internal Medicine

## 2022-05-12 NOTE — Telephone Encounter (Signed)
Patient with significant withdrawal symptoms off lexapro. PCP has restarted lexapro and will need to do a slow wean of lexapro and will start wellbutrin once off lexapro. Pt will call back once off lexapro to have tikosyn admission rescheduled. Pt in agreement with this plan.

## 2022-05-14 ENCOUNTER — Ambulatory Visit: Payer: Medicare HMO | Admitting: Cardiology

## 2022-05-17 ENCOUNTER — Ambulatory Visit (HOSPITAL_COMMUNITY): Payer: Medicare HMO | Admitting: Internal Medicine

## 2022-05-19 ENCOUNTER — Inpatient Hospital Stay (HOSPITAL_COMMUNITY): Admit: 2022-05-19 | Payer: Medicare HMO | Admitting: Cardiovascular Disease

## 2022-05-19 ENCOUNTER — Encounter (HOSPITAL_COMMUNITY): Payer: Self-pay

## 2022-05-19 SURGERY — CARDIOVERSION
Anesthesia: Monitor Anesthesia Care

## 2022-06-01 ENCOUNTER — Ambulatory Visit (HOSPITAL_COMMUNITY): Payer: Medicare HMO | Admitting: Internal Medicine

## 2022-06-04 ENCOUNTER — Encounter (HOSPITAL_COMMUNITY): Payer: Self-pay

## 2022-06-08 ENCOUNTER — Ambulatory Visit (HOSPITAL_COMMUNITY)
Admission: RE | Admit: 2022-06-08 | Discharge: 2022-06-08 | Disposition: A | Discharge status: Home / Self Care | Payer: Medicare HMO | Source: Ambulatory Visit | Attending: Internal Medicine | Admitting: Internal Medicine

## 2022-06-08 ENCOUNTER — Other Ambulatory Visit (HOSPITAL_COMMUNITY): Payer: Self-pay

## 2022-06-08 ENCOUNTER — Telehealth (HOSPITAL_COMMUNITY): Payer: Self-pay | Admitting: Pharmacy Technician

## 2022-06-08 ENCOUNTER — Inpatient Hospital Stay (HOSPITAL_COMMUNITY)
Admission: AD | Admit: 2022-06-08 | Discharge: 2022-06-12 | DRG: 309 | Disposition: A | Discharge status: Home / Self Care | Payer: Medicare HMO | Attending: Cardiology | Admitting: Cardiology

## 2022-06-08 ENCOUNTER — Encounter (HOSPITAL_COMMUNITY): Payer: Self-pay | Admitting: Cardiology

## 2022-06-08 ENCOUNTER — Other Ambulatory Visit: Payer: Self-pay

## 2022-06-08 VITALS — BP 102/66 | HR 129 | Ht 72.0 in | Wt 227.8 lb

## 2022-06-08 DIAGNOSIS — Z955 Presence of coronary angioplasty implant and graft: Secondary | ICD-10-CM

## 2022-06-08 DIAGNOSIS — Z7901 Long term (current) use of anticoagulants: Secondary | ICD-10-CM | POA: Diagnosis not present

## 2022-06-08 DIAGNOSIS — E0590 Thyrotoxicosis, unspecified without thyrotoxic crisis or storm: Secondary | ICD-10-CM | POA: Diagnosis present

## 2022-06-08 DIAGNOSIS — Z7984 Long term (current) use of oral hypoglycemic drugs: Secondary | ICD-10-CM

## 2022-06-08 DIAGNOSIS — Z833 Family history of diabetes mellitus: Secondary | ICD-10-CM | POA: Diagnosis not present

## 2022-06-08 DIAGNOSIS — E785 Hyperlipidemia, unspecified: Secondary | ICD-10-CM | POA: Diagnosis present

## 2022-06-08 DIAGNOSIS — I4891 Unspecified atrial fibrillation: Secondary | ICD-10-CM | POA: Diagnosis not present

## 2022-06-08 DIAGNOSIS — D6869 Other thrombophilia: Secondary | ICD-10-CM | POA: Diagnosis present

## 2022-06-08 DIAGNOSIS — K219 Gastro-esophageal reflux disease without esophagitis: Secondary | ICD-10-CM | POA: Diagnosis present

## 2022-06-08 DIAGNOSIS — Z96641 Presence of right artificial hip joint: Secondary | ICD-10-CM | POA: Diagnosis present

## 2022-06-08 DIAGNOSIS — Z9981 Dependence on supplemental oxygen: Secondary | ICD-10-CM

## 2022-06-08 DIAGNOSIS — Z87891 Personal history of nicotine dependence: Secondary | ICD-10-CM

## 2022-06-08 DIAGNOSIS — Z8616 Personal history of COVID-19: Secondary | ICD-10-CM | POA: Diagnosis not present

## 2022-06-08 DIAGNOSIS — J4489 Other specified chronic obstructive pulmonary disease: Secondary | ICD-10-CM | POA: Diagnosis present

## 2022-06-08 DIAGNOSIS — I2510 Atherosclerotic heart disease of native coronary artery without angina pectoris: Secondary | ICD-10-CM | POA: Diagnosis present

## 2022-06-08 DIAGNOSIS — Z794 Long term (current) use of insulin: Secondary | ICD-10-CM | POA: Diagnosis not present

## 2022-06-08 DIAGNOSIS — I4821 Permanent atrial fibrillation: Secondary | ICD-10-CM | POA: Diagnosis present

## 2022-06-08 DIAGNOSIS — Z825 Family history of asthma and other chronic lower respiratory diseases: Secondary | ICD-10-CM | POA: Diagnosis not present

## 2022-06-08 DIAGNOSIS — Z91199 Patient's noncompliance with other medical treatment and regimen due to unspecified reason: Secondary | ICD-10-CM

## 2022-06-08 DIAGNOSIS — I4819 Other persistent atrial fibrillation: Principal | ICD-10-CM | POA: Diagnosis present

## 2022-06-08 DIAGNOSIS — I10 Essential (primary) hypertension: Secondary | ICD-10-CM | POA: Diagnosis present

## 2022-06-08 DIAGNOSIS — G4733 Obstructive sleep apnea (adult) (pediatric): Secondary | ICD-10-CM | POA: Diagnosis present

## 2022-06-08 DIAGNOSIS — I4892 Unspecified atrial flutter: Secondary | ICD-10-CM | POA: Diagnosis present

## 2022-06-08 DIAGNOSIS — Z823 Family history of stroke: Secondary | ICD-10-CM

## 2022-06-08 DIAGNOSIS — Z79899 Other long term (current) drug therapy: Secondary | ICD-10-CM

## 2022-06-08 DIAGNOSIS — N189 Chronic kidney disease, unspecified: Secondary | ICD-10-CM | POA: Diagnosis not present

## 2022-06-08 DIAGNOSIS — Z86010 Personal history of colonic polyps: Secondary | ICD-10-CM

## 2022-06-08 DIAGNOSIS — Z8249 Family history of ischemic heart disease and other diseases of the circulatory system: Secondary | ICD-10-CM

## 2022-06-08 DIAGNOSIS — Z981 Arthrodesis status: Secondary | ICD-10-CM

## 2022-06-08 DIAGNOSIS — I129 Hypertensive chronic kidney disease with stage 1 through stage 4 chronic kidney disease, or unspecified chronic kidney disease: Secondary | ICD-10-CM | POA: Diagnosis not present

## 2022-06-08 DIAGNOSIS — Z85820 Personal history of malignant melanoma of skin: Secondary | ICD-10-CM | POA: Diagnosis not present

## 2022-06-08 DIAGNOSIS — Z9081 Acquired absence of spleen: Secondary | ICD-10-CM

## 2022-06-08 DIAGNOSIS — Z880 Allergy status to penicillin: Secondary | ICD-10-CM

## 2022-06-08 DIAGNOSIS — E119 Type 2 diabetes mellitus without complications: Principal | ICD-10-CM | POA: Diagnosis present

## 2022-06-08 DIAGNOSIS — I25119 Atherosclerotic heart disease of native coronary artery with unspecified angina pectoris: Secondary | ICD-10-CM | POA: Diagnosis not present

## 2022-06-08 DIAGNOSIS — Z634 Disappearance and death of family member: Secondary | ICD-10-CM | POA: Diagnosis not present

## 2022-06-08 DIAGNOSIS — R531 Weakness: Secondary | ICD-10-CM | POA: Diagnosis present

## 2022-06-08 DIAGNOSIS — E1122 Type 2 diabetes mellitus with diabetic chronic kidney disease: Secondary | ICD-10-CM | POA: Diagnosis not present

## 2022-06-08 DIAGNOSIS — Z87442 Personal history of urinary calculi: Secondary | ICD-10-CM

## 2022-06-08 HISTORY — DX: Other persistent atrial fibrillation: I48.19

## 2022-06-08 LAB — BASIC METABOLIC PANEL
Anion gap: 9 (ref 5–15)
BUN: 13 mg/dL (ref 8–23)
CO2: 24 mmol/L (ref 22–32)
Calcium: 8.4 mg/dL — ABNORMAL LOW (ref 8.9–10.3)
Chloride: 105 mmol/L (ref 98–111)
Creatinine, Ser: 1.37 mg/dL — ABNORMAL HIGH (ref 0.61–1.24)
GFR, Estimated: 55 mL/min — ABNORMAL LOW (ref >60)
Glucose, Bld: 134 mg/dL — ABNORMAL HIGH (ref 70–99)
Potassium: 4.5 mmol/L (ref 3.5–5.1)
Sodium: 138 mmol/L (ref 135–145)

## 2022-06-08 LAB — MAGNESIUM
Magnesium: 1.3 mg/dL — ABNORMAL LOW (ref 1.7–2.4)
Magnesium: 2.4 mg/dL (ref 1.7–2.4)

## 2022-06-08 LAB — HIV ANTIBODY (ROUTINE TESTING W REFLEX): HIV Screen 4th Generation wRfx: NONREACTIVE

## 2022-06-08 MED ORDER — BASAGLAR KWIKPEN 100 UNIT/ML ~~LOC~~ SOPN: 35.0000 [IU] | PEN_INJECTOR | Freq: Every morning | SUBCUTANEOUS | Status: DC

## 2022-06-08 MED ORDER — INSULIN GLARGINE-YFGN 100 UNIT/ML ~~LOC~~ SOLN
35.0000 [IU] | Freq: Every day | SUBCUTANEOUS | Status: DC
  Administered 2022-06-09: 35 [IU] via SUBCUTANEOUS
  Filled 2022-06-08: qty 0.35

## 2022-06-08 MED ORDER — BUPROPION HCL ER (XL) 150 MG PO TB24
150.0000 mg | ORAL_TABLET | Freq: Every day | ORAL | Status: DC
  Administered 2022-06-09 – 2022-06-12 (×4): 150 mg via ORAL
  Filled 2022-06-08 (×4): qty 1

## 2022-06-08 MED ORDER — SODIUM CHLORIDE 0.9 % IV SOLN
250.0000 mL | INTRAVENOUS | Status: DC | PRN
  Administered 2022-06-10: 250 mL via INTRAVENOUS

## 2022-06-08 MED ORDER — METOPROLOL TARTRATE 50 MG PO TABS
50.0000 mg | ORAL_TABLET | Freq: Two times a day (BID) | ORAL | Status: DC
  Administered 2022-06-08 – 2022-06-09 (×2): 50 mg via ORAL
  Filled 2022-06-08 (×2): qty 1

## 2022-06-08 MED ORDER — SODIUM CHLORIDE 0.9% FLUSH
3.0000 mL | Freq: Two times a day (BID) | INTRAVENOUS | Status: DC
  Administered 2022-06-08 – 2022-06-12 (×5): 3 mL via INTRAVENOUS

## 2022-06-08 MED ORDER — DOCUSATE SODIUM 100 MG PO CAPS: 100.0000 mg | ORAL_CAPSULE | Freq: Two times a day (BID) | ORAL | Status: DC | PRN

## 2022-06-08 MED ORDER — FERROUS SULFATE 325 (65 FE) MG PO TABS
325.0000 mg | ORAL_TABLET | Freq: Every day | ORAL | Status: DC
  Administered 2022-06-09 – 2022-06-12 (×4): 325 mg via ORAL
  Filled 2022-06-08 (×4): qty 1

## 2022-06-08 MED ORDER — MAGNESIUM SULFATE 4 GM/100ML IV SOLN
4.0000 g | Freq: Once | INTRAVENOUS | Status: AC
  Administered 2022-06-08: 4 g via INTRAVENOUS
  Filled 2022-06-08: qty 100

## 2022-06-08 MED ORDER — MELATONIN 10 MG PO TABS
20.0000 mg | ORAL_TABLET | Freq: Every day | ORAL | Status: DC
  Filled 2022-06-08: qty 2

## 2022-06-08 MED ORDER — SODIUM CHLORIDE 0.9% FLUSH: 3.0000 mL | INTRAVENOUS | Status: DC | PRN

## 2022-06-08 MED ORDER — ACETAMINOPHEN 500 MG PO TABS
1000.0000 mg | ORAL_TABLET | Freq: Four times a day (QID) | ORAL | Status: DC | PRN
  Administered 2022-06-08 – 2022-06-11 (×7): 1000 mg via ORAL
  Filled 2022-06-08 (×7): qty 2

## 2022-06-08 MED ORDER — NITROGLYCERIN 0.4 MG SL SUBL: 0.4000 mg | SUBLINGUAL_TABLET | SUBLINGUAL | Status: DC | PRN

## 2022-06-08 MED ORDER — LOSARTAN POTASSIUM 25 MG PO TABS
12.5000 mg | ORAL_TABLET | Freq: Every day | ORAL | Status: DC
  Administered 2022-06-09 – 2022-06-12 (×4): 12.5 mg via ORAL
  Filled 2022-06-08 (×4): qty 1

## 2022-06-08 MED ORDER — PANTOPRAZOLE SODIUM 40 MG PO TBEC
40.0000 mg | DELAYED_RELEASE_TABLET | Freq: Two times a day (BID) | ORAL | Status: DC
  Administered 2022-06-08 – 2022-06-12 (×8): 40 mg via ORAL
  Filled 2022-06-08 (×8): qty 1

## 2022-06-08 MED ORDER — BUSPIRONE HCL 5 MG PO TABS: 5.0000 mg | ORAL_TABLET | Freq: Three times a day (TID) | ORAL | Status: DC | PRN

## 2022-06-08 MED ORDER — OXYCODONE HCL 5 MG PO TABS: 10.0000 mg | ORAL_TABLET | Freq: Four times a day (QID) | ORAL | Status: DC | PRN

## 2022-06-08 MED ORDER — GABAPENTIN 300 MG PO CAPS
300.0000 mg | ORAL_CAPSULE | Freq: Two times a day (BID) | ORAL | Status: DC
  Administered 2022-06-08 – 2022-06-12 (×8): 300 mg via ORAL
  Filled 2022-06-08 (×8): qty 1

## 2022-06-08 MED ORDER — MAGNESIUM SULFATE 2 GM/50ML IV SOLN
2.0000 g | Freq: Once | INTRAVENOUS | Status: AC
  Administered 2022-06-08: 2 g via INTRAVENOUS
  Filled 2022-06-08: qty 50

## 2022-06-08 MED ORDER — DOFETILIDE 500 MCG PO CAPS
500.0000 ug | ORAL_CAPSULE | Freq: Two times a day (BID) | ORAL | Status: DC
  Administered 2022-06-08 – 2022-06-10 (×4): 500 ug via ORAL
  Filled 2022-06-08 (×4): qty 1

## 2022-06-08 MED ORDER — ZOLPIDEM TARTRATE 5 MG PO TABS
5.0000 mg | ORAL_TABLET | Freq: Every evening | ORAL | Status: DC | PRN
  Administered 2022-06-09 – 2022-06-11 (×2): 5 mg via ORAL
  Filled 2022-06-08 (×2): qty 1

## 2022-06-08 MED ORDER — FENOFIBRATE 160 MG PO TABS
160.0000 mg | ORAL_TABLET | Freq: Every day | ORAL | Status: DC
  Administered 2022-06-09 – 2022-06-12 (×4): 160 mg via ORAL
  Filled 2022-06-08 (×4): qty 1

## 2022-06-08 MED ORDER — TAMSULOSIN HCL 0.4 MG PO CAPS
0.4000 mg | ORAL_CAPSULE | Freq: Every day | ORAL | Status: DC
  Administered 2022-06-09 – 2022-06-12 (×4): 0.4 mg via ORAL
  Filled 2022-06-08 (×4): qty 1

## 2022-06-08 MED ORDER — EMPAGLIFLOZIN 25 MG PO TABS
25.0000 mg | ORAL_TABLET | Freq: Every day | ORAL | Status: DC
  Administered 2022-06-09 – 2022-06-12 (×4): 25 mg via ORAL
  Filled 2022-06-08 (×4): qty 1

## 2022-06-08 MED ORDER — METFORMIN HCL 500 MG PO TABS
1000.0000 mg | ORAL_TABLET | Freq: Two times a day (BID) | ORAL | Status: DC
  Administered 2022-06-08 – 2022-06-12 (×6): 1000 mg via ORAL
  Filled 2022-06-08 (×9): qty 2

## 2022-06-08 MED ORDER — BACLOFEN 10 MG PO TABS
10.0000 mg | ORAL_TABLET | Freq: Every day | ORAL | Status: DC
  Administered 2022-06-08 – 2022-06-11 (×4): 10 mg via ORAL
  Filled 2022-06-08 (×4): qty 1

## 2022-06-08 MED ORDER — APIXABAN 5 MG PO TABS
5.0000 mg | ORAL_TABLET | Freq: Two times a day (BID) | ORAL | Status: DC
  Administered 2022-06-08 – 2022-06-12 (×8): 5 mg via ORAL
  Filled 2022-06-08 (×8): qty 1

## 2022-06-08 MED ORDER — OXYCODONE HCL 5 MG PO TABS
10.0000 mg | ORAL_TABLET | ORAL | Status: DC | PRN
  Administered 2022-06-08 – 2022-06-12 (×16): 10 mg via ORAL
  Filled 2022-06-08 (×17): qty 2

## 2022-06-08 MED ORDER — MELATONIN 5 MG PO TABS
20.0000 mg | ORAL_TABLET | Freq: Every day | ORAL | Status: DC
  Administered 2022-06-08 – 2022-06-11 (×4): 20 mg via ORAL
  Filled 2022-06-08 (×4): qty 4

## 2022-06-08 NOTE — Care Management (Signed)
  Transition of Care [facility]) Screening Note   Patient Details  Name: PAOLO DUBON Date of Birth: 09/09/1950   [facility] of [facility]) CM/SW Contact:    Ronny Bacon, RN Phone Number: 06/08/2022, 2:27 PM    Transition of Care Department [facility]) has reviewed patient.  Patient presented for Tikosyn Load. Benefits check submitted for cost. Co-pay is $0.00. Case Manager will discuss cost and pharmacy of choice as the patient progresses.

## 2022-06-08 NOTE — TOC Benefit Eligibility Note (Signed)
Patient Product/process development scientist completed.    The patient is currently admitted and upon discharge could be taking dofetilide (Tikosyn) 500 mcg capsules.  The current 30 day co-pay is $0.00.   The patient is insured through [employer] Part D   This test claim was processed through [facility]- copay amounts may vary at other pharmacies due to pharmacy/plan contracts, or as the patient moves through the different stages of their insurance plan.  Roland Earl, CPHT Pharmacy Patient Advocate Specialist [facility] Patient Advocate Team Direct Number: [phone]  Fax: [phone]

## 2022-06-08 NOTE — H&P (Signed)
Primary Care Physician: Default, Provider, MD Primary Cardiologist: none Primary Electrophysiologist: Dr Elberta Fortis Referring Physician: Dr Ludwig Lean is a 72 y.o. male with a history of CAD, COPD, HLD, SVT, DM, OSA, hyperthyroidism, HTN, atrial flutter, atrial fibrillation who presents for follow up in the [facility]. Patient is s/p SVT ablation in 2007 and afib/flutter ablation in 2011 with Dr Johney Frame. Patient is on Eliquis for a CHADS2VASC score of 4.  On follow up 03/26/22, patient reports that for the last 6-8 weeks he has been having symptoms of palpitations, chest discomfort, intermittent dizziness, and labile heart rates. He is in afib today. He has been under stress lately after his brother passed away unexpectedly. No bleeding issues on anticoagulation.   On follow up 06/08/22, patient is currently in Afib with RVR here for Tikosyn admission. He underwent DCCV on 3/19 with successful conversion to NSR x 2 shocks. Seen by Dr. Elberta Fortis on 3/26 and was back in Afib at that time. Feels fatigued and short of breath when in Afib. He is on oxygen for COPD so ablation was deferred and amiodarone to be avoided. Pharmacist reviewed med list and advised trazodone and lexapro be transitioned. Patient stopped lexapro on his own and required slow wean and transitioned to wellbutrin and buspar. No missed doses of anticoagulation. No benadryl use.   Today, he denies symptoms of shortness of breath, orthopnea, PND, lower extremity edema, presyncope, syncope, snoring, daytime somnolence, bleeding, or neurologic sequela. The patient is tolerating medications without difficulties and is otherwise without complaint today.    Atrial Fibrillation Risk Factors:  he does have symptoms or diagnosis of sleep apnea. he is not compliant with CPAP therapy. he does not have a history of rheumatic fever. The patient does have a history of early familial atrial fibrillation or  other arrhythmias.  he has a BMI of There is no height or weight on file to calculate BMI.. There were no vitals filed for this visit.   Family History  Problem Relation Age of Onset   Emphysema Mother    Heart attack Father        CVA   Hypertension Father    Diabetes Father    Heart disease Father        MI   Emphysema Maternal Grandfather        Smoker   Heart disease Paternal Grandfather    Colon cancer Neg Hx    Esophageal cancer Neg Hx    Inflammatory bowel disease Neg Hx    Liver disease Neg Hx    Pancreatic cancer Neg Hx    Rectal cancer Neg Hx    Stomach cancer Neg Hx      Atrial Fibrillation Management history:  Previous antiarrhythmic drugs: amiodarone Previous cardioversions: 04/13/22 Previous ablations: 2007 SVT, 2011 afib and flutter CHADS2VASC score: 4 Anticoagulation history: warfarin, Eliquis   Past Medical History:  Diagnosis Date   Anemia    Arthritis    Asthma    CAD (coronary artery disease)    a. S/P PCI mid LAD (vision stent) 08/31/2004;  b. repeat cath 6/11 - no progression of CAD. c. Cath 10/2012 given moderate CAD on cardiac CT - no change from prior cath.   Chest pain 12/16/2016   COPD (chronic obstructive pulmonary disease) (HCC)    COVID-19 virus infection    Diverticulitis    DJD (degenerative joint disease) of lumbar spine    Dyslipidemia    Gallstones  GERD (gastroesophageal reflux disease)    History of kidney stones    Hypertension    Hyperthyroidism    Melanoma (HCC)    melanoma removed from neck    On home oxygen therapy    "2L q hs" (11/08/2012),  06/04/19 pt. not on oxygen   OSA (obstructive sleep apnea)    a. cpap noncompliance- patient reports on 12/10/14- does not use CPAP   Palpitations    a. 10/2012 - s/p LINQ loop recorder to assess for arrhythmia.    Pancreatitis    Paroxysmal A-fib (HCC)    a. S/P PVI 2/11 and 9/11   PICC (peripherally inserted central catheter) in place    Pneumonia    SVT  (supraventricular tachycardia)    a. Atypical AVNRT of the slow pathway   Type II diabetes mellitus [facility])    Past Surgical History:  Procedure Laterality Date   ATRIAL ABLATION SURGERY  2007   S/P slow pathway ablation for atypical AVNRT    BIOPSY  07/17/2018   Procedure: BIOPSY;  Surgeon: Lemar Lofty., MD;  Location: [facility] ENDOSCOPY;  Service: Gastroenterology;;   CARDIOVERSION N/A 04/13/2022   Procedure: CARDIOVERSION;  Surgeon: Chrystie Nose, MD;  Location: [facility] ENDOSCOPY;  Service: Cardiovascular;  Laterality: N/A;   CHOLECYSTECTOMY  ~ 2008   COLON RESECTION  02/2013    COLONOSCOPY  12/25/2007   Colonic polyp status post polypectomy. Internal hemorrhoids.    CORONARY ANGIOPLASTY WITH STENT PLACEMENT  08/30/2004   Vision; to LAD    CYSTOSCOPY W/ STONE MANIPULATION  ~ 1998   "once" (11/08/2012)   EP Study  2008   Negative EP study in [place]   ESOPHAGOGASTRODUODENOSCOPY (EGD) WITH PROPOFOL N/A 07/17/2018   Procedure: ESOPHAGOGASTRODUODENOSCOPY (EGD) WITH PROPOFOL;  Surgeon: Lemar Lofty., MD;  Location: [facility] ENDOSCOPY;  Service: Gastroenterology;  Laterality: N/A;   FRACTURE SURGERY     LEFT HEART CATHETERIZATION WITH CORONARY ANGIOGRAM N/A 11/10/2012   Procedure: LEFT HEART CATHETERIZATION WITH CORONARY ANGIOGRAM;  Surgeon: Kathleene Hazel, MD;  Location: [facility] CATH LAB;  Service: Cardiovascular;  Laterality: N/A;   LOOP RECORDER IMPLANT N/A 11/10/2012   Medtronic LinQ implanted by Dr Johney Frame for afib management   LUMBAR DISC SURGERY  2000; 2002   POSTERIOR LUMBAR FUSION  2004   PROSTATE SURGERY  ~ 2009   PVI/ CTI ablation  02/25/2009 and 09/2009   S/P afib/ CTI ablation   SPLENECTOMY, TOTAL N/A 06/07/2019   Procedure: SPLENECTOMY;  Surgeon: Almond Lint, MD;  Location: [facility] OR;  Service: General;  Laterality: N/A;   TIBIA FRACTURE SURGERY Right 1990   "broke in 3 places; had rod put in" (11/08/2012)   TIBIA HARDWARE REMOVAL Right ~ 1991   TONSILLECTOMY  1965    TOTAL HIP ARTHROPLASTY Right 12/18/2014   Procedure: RIGHT TOTAL HIP ARTHROPLASTY ANTERIOR APPROACH;  Surgeon: Ollen Gross, MD;  Location: [facility] ORS;  Service: Orthopedics;  Laterality: Right;   UPPER ESOPHAGEAL ENDOSCOPIC ULTRASOUND (EUS) N/A 07/17/2018   Procedure: UPPER ESOPHAGEAL ENDOSCOPIC ULTRASOUND (EUS);  Surgeon: Lemar Lofty., MD;  Location: [facility] ENDOSCOPY;  Service: Gastroenterology;  Laterality: N/A;    Current Facility-Administered Medications  Medication Dose Route Frequency Provider Last Rate Last Admin   0.9 %  sodium chloride infusion  250 mL Intravenous PRN Eustace Pen, PA-C       acetaminophen (TYLENOL) tablet 1,000 mg  1,000 mg Oral Q6H PRN Eustace Pen, PA-C   1,000 mg at 06/08/22 1640   apixaban (  ELIQUIS) tablet 5 mg  5 mg Oral BID Eustace Pen, PA-C       baclofen (LIORESAL) tablet 10 mg  10 mg Oral QHS Eustace Pen, [place]       [START ON 06/09/2022] buPROPion (WELLBUTRIN XL) 24 hr tablet 150 mg  150 mg Oral Daily Eustace Pen, PA-C       busPIRone (BUSPAR) tablet 5 mg  5 mg Oral Q8H PRN Eustace Pen, PA-C       docusate sodium (COLACE) capsule 100 mg  100 mg Oral BID PRN Eustace Pen, PA-C       dofetilide (TIKOSYN) capsule 500 mcg  500 mcg Oral BID Eustace Pen, PA-C       [START ON 06/09/2022] empagliflozin (JARDIANCE) tablet 25 mg  25 mg Oral Daily Eustace Pen, [place]       [START ON 06/09/2022] fenofibrate tablet 160 mg  160 mg Oral Daily Eustace Pen, [place]       [START ON 06/09/2022] ferrous sulfate tablet 325 mg  325 mg Oral Q breakfast Eustace Pen, PA-C       gabapentin (NEURONTIN) capsule 300 mg  300 mg Oral BID Eustace Pen, PA-C       [START ON 06/09/2022] insulin glargine-yfgn (SEMGLEE) injection 35 Units  35 Units Subcutaneous Daily Analeese Andreatta Daphine Deutscher, MD       [START ON 06/09/2022] losartan (COZAAR) tablet 12.5 mg  12.5 mg Oral Daily Eustace Pen, PA-C       melatonin tablet 20 mg  20 mg Oral QHS Madalyn Legner,  Oleda Borski Daphine Deutscher, MD       metFORMIN (GLUCOPHAGE) tablet 1,000 mg  1,000 mg Oral BID WC Eustace Pen, PA-C   1,000 mg at 06/08/22 1735   metoprolol tartrate (LOPRESSOR) tablet 50 mg  50 mg Oral BID Eustace Pen, PA-C       nitroGLYCERIN (NITROSTAT) SL tablet 0.4 mg  0.4 mg Sublingual Q5 min PRN Eustace Pen, PA-C       oxyCODONE (Oxy IR/ROXICODONE) immediate release tablet 10 mg  10 mg Oral Q4H PRN Sheilah Pigeon, PA-C   10 mg at 06/08/22 1500   pantoprazole (PROTONIX) EC tablet 40 mg  40 mg Oral BID Eustace Pen, PA-C       sodium chloride flush (NS) 0.9 % injection 3 mL  3 mL Intravenous Q12H Eustace Pen, PA-C   3 mL at 06/08/22 1507   sodium chloride flush (NS) 0.9 % injection 3 mL  3 mL Intravenous PRN Eustace Pen, PA-C       [START ON 06/09/2022] tamsulosin (FLOMAX) capsule 0.4 mg  0.4 mg Oral Daily Eustace Pen, PA-C       zolpidem (AMBIEN) tablet 5 mg  5 mg Oral QHS PRN Eustace Pen, PA-C        Allergies  Allergen Reactions   Penicillins Hives and Shortness Of Breath    Did it involve swelling of the face/tongue/throat, SOB, or low BP? Yes Did it involve sudden or severe rash/hives, skin peeling, or any reaction on the inside of your mouth or nose? No Did you need to seek medical attention at a hospital or doctor's office? Yes (emergency room) When did it last happen?  1974 If all above answers are "NO", may proceed with cephalosporin use.      Social History   Socioeconomic History   Marital status: Married    Spouse name: Not  on file   Number of children: 1   Years of education: Not on file   Highest education level: Not on file  Occupational History   Occupation: DISABLED    Employer: DISABLED   Occupation: Retired [employer]  Tobacco Use   Smoking status: Former    Packs/day: 3.00    Years: 38.00    Additional pack years: 0.00    Total pack years: 114.00    Types: Cigarettes    Quit date: 03/25/2004    Years since quitting:  18.2   Smokeless tobacco: Former    Types: Chew    Quit date: 04/25/2004   Tobacco comments:    Former smoker 03/26/22  Vaping Use   Vaping Use: Never used  Substance and Sexual Activity   Alcohol use: Not Currently    Comment:  "quit drinking in 1989"   Drug use: No   Sexual activity: Not Currently  Other Topics Concern   Not on file  Social History Narrative   Resides in [place] with his wife   Two children and two grandchildren   Attends [employer]   Social Determinants of Health   Financial Resource Strain: Not on file  Food Insecurity: No Food Insecurity (06/08/2022)   Hunger Vital Sign    Worried About Running Out of Food in the Last Year: Never true    Ran Out of Food in the Last Year: Never true  Transportation Needs: No Transportation Needs (06/08/2022)   PRAPARE - Administrator, Civil Service (Medical): No    Lack of Transportation (Non-Medical): No  Physical Activity: Not on file  Stress: Not on file  Social Connections: Not on file  Intimate Partner Violence: Not At Risk (06/08/2022)   Humiliation, Afraid, Rape, and Kick questionnaire    Fear of Current or Ex-Partner: No    Emotionally Abused: No    Physically Abused: No    Sexually Abused: No     ROS- All systems are reviewed and negative except as per the HPI above.  Physical Exam: Vitals:   06/08/22 1228  BP: 111/77  Pulse: 71  Temp: 97.9 F (36.6 C)  TempSrc: Oral  SpO2: 97%   GEN- The patient is well appearing, alert and oriented x 3 today.   Head- normocephalic, atraumatic Eyes-  Sclera clear, conjunctiva pink Ears- hearing intact Oropharynx- clear Neck- supple, no JVP Lymph- no cervical lymphadenopathy Lungs- Clear to ausculation bilaterally, normal work of breathing Heart- Tachycardic irregular rate and rhythm, no murmurs, rubs or gallops, PMI not laterally displaced GI- soft, NT, ND, + BS Extremities- no clubbing, cyanosis, or edema MS- no significant  deformity or atrophy Skin- no rash or lesion Psych- euthymic mood, full affect Neuro- strength and sensation are intact   Wt Readings from Last 3 Encounters:  06/08/22 103.3 kg  04/20/22 110.2 kg  04/13/22 105.2 kg    EKG today demonstrates  Vent. rate 129 BPM PR interval * ms QRS duration 94 ms QT/QTcB 336/492 ms P-R-T axes * -76 64 Atrial fibrillation with rapid ventricular response Left axis deviation Septal infarct , age undetermined ST & T wave abnormality, consider anterior ischemia Abnormal ECG When compared with ECG of 13-Apr-2022 11:23, PREVIOUS ECG IS PRESENT  Echo 06/13/20 demonstrated   1. Left ventricular ejection fraction, by estimation, is 60 to 65%. The  left ventricle has normal function. The left ventricle has no regional  wall motion abnormalities. Left ventricular diastolic parameters are  indeterminate.  2. Right ventricular systolic function is normal. The right ventricular  size is normal.   3. Left atrial size was mildly dilated.   4. Right atrial size was mildly dilated.   5. The mitral valve is grossly normal. No evidence of mitral valve  regurgitation.   6. The aortic valve is grossly normal. Aortic valve regurgitation is not  visualized. No aortic stenosis is present.   7. Aortic dilatation noted. There is moderate dilatation of the ascending  aorta, measuring 46 mm. There is moderate dilatation of the aortic root,  measuring 46 mm.   8. The inferior vena cava is normal in size with greater than 50%  respiratory variability, suggesting right atrial pressure of 3 mmHg.   Comparison(s): A prior study was performed on 12/17/2016. Prior images  reviewed side by side. Increase in aortic root and ascending aortic  measurements from prior study.   Epic records are reviewed at length today  CHA2DS2-VASc Score = 4  The patient's score is based upon: CHF History: 0 HTN History: 1 Diabetes History: 1 Stroke History: 0 Vascular Disease History:  1 Age Score: 1 Gender Score: 0       ASSESSMENT AND PLAN: 1. Persistent Atrial Fibrillation/atrial flutter The patient's CHA2DS2-VASc score is 4, indicating a 4.8% annual risk of stroke.    He is in Afib with RVR today.  Patient would like to pursue dofetilide and presents today for dofetilide admission. Continue Eliquis 5 mg BID, states no missed doses in the last 3 weeks. No recent benadryl use PharmD has screened medications - he is on buspar and wellbutrin now.  QTc in SR 444 ms (ECG 05/04/21) Labs today show creatinine at 1.37, K+ 4.5 and mag 1.3, CrCl calculated at 73  He is eligible for Tikosyn 500 mcg BID dose.   2. Secondary Hypercoagulable State (ICD10:  D68.69) The patient is at significant risk for stroke/thromboembolism based upon his CHA2DS2-VASc Score of 4.  Continue Apixaban (Eliquis).  No missed doses.   3. Obstructive sleep apnea Patient has not been on CPAP for several years.     He Mykira Hofmeister go to admissions.    Lake Bells, PA-C Afib Clinic [facility] [address] [place], [place] [address] [phone] 06/08/2022 7:03 PM  I have seen and examined this patient with Lake Bells.  Agree with above, note added to reflect my findings.  Patient with a history of atrial fibrillation. Presents for [employer] load. In AF today. Feeling weak and fatigued at baseline.   GEN: Well nourished, well developed, in no acute distress  HEENT: normal  Neck: no JVD, carotid bruits, or masses Cardiac: irregular; no murmurs, rubs, or gallops,no edema  Respiratory:  clear to auscultation bilaterally, normal work of breathing GI: soft, nontender, nondistended, + BS MS: no deformity or atrophy  Skin: warm and dry Neuro:  Strength and sensation are intact Psych: euthymic mood, full affect   Persistent atrial fibrillation: plan for tikosyn load. QTc acceptable. Chanteria Haggard give 500 mcg tonight. Maisy Newport likely need DCCV after 4th dose. Keep K>4, Mg>2. ECG after each  dose.   Ching Rabideau M. Alder Murri MD 06/08/2022 7:03 PM

## 2022-06-08 NOTE — Telephone Encounter (Signed)
Pharmacy Patient Advocate Encounter  Insurance verification completed.    The patient is insured through [employer] Part D   The patient is currently admitted and ran test claims for the following: dofetilide (Tikosyn).  Copays and coinsurance results were relayed to Inpatient clinical team.  

## 2022-06-08 NOTE — Progress Notes (Signed)
Pharmacy: Dofetilide (Tikosyn) - Initial Consult Assessment and Electrolyte Replacement  Pharmacy consulted to assist in monitoring and replacing electrolytes in this 72 y.o. male admitted on 06/08/2022 undergoing dofetilide initiation. First dofetilide dose: possible 5/14 pm after magnesium replaced and verified level >2.0  Assessment:  Patient Exclusion Criteria: If any screening criteria checked as "Yes", then  patient  should NOT receive dofetilide until criteria item is corrected.  If "Yes" please indicate correction plan.  YES  NO Patient  Exclusion Criteria Correction Plan   []   [x]   Baseline QTc interval is greater than or equal to 440 msec. IF above YES box checked dofetilide contraindicated unless patient has ICD; then may proceed if QTc 500-550 msec or with known ventricular conduction abnormalities may proceed with QTc 550-600 msec. QTc =      []   [x]   Patient is known or suspected to have a digoxin level greater than 2 ng/ml: No results found for: "DIGOXIN"     []   [x]   Creatinine clearance less than 20 ml/min (calculated using Cockcroft-Gault, actual body weight and serum creatinine): Estimated Creatinine Clearance: 62.4 mL/min (A) (by C-G formula based on SCr of 1.37 mg/dL (H)).     []   [x]  Patient has received drugs known to prolong the QT intervals within the last 48 hours (phenothiazines, tricyclics or tetracyclic antidepressants, erythromycin, H-1 antihistamines, cisapride, fluoroquinolones, azithromycin, ondansetron).   Updated information on QT prolonging agents is available to be searched on the following database:QT prolonging agents     []   [x]   Patient received a dose of hydrochlorothiazide (Oretic) alone or in any combination including triamterene (Dyazide, Maxzide) in the last 48 hours.    []   [x]  Patient received a medication known to increase dofetilide plasma concentrations prior to initial dofetilide dose:  Trimethoprim (Primsol, Proloprim) in  the last 36 hours Verapamil (Calan, Verelan) in the last 36 hours or a sustained release dose in the last 72 hours Megestrol (Megace) in the last 5 days  Cimetidine (Tagamet) in the last 6 hours Ketoconazole (Nizoral) in the last 24 hours Itraconazole (Sporanox) in the last 48 hours  Prochlorperazine (Compazine) in the last 36 hours     []   [x]   Patient is known to have a history of torsades de pointes; congenital or acquired long QT syndromes.    []   [x]   Patient has received a Class 1 antiarrhythmic with less than 2 half-lives since last dose. (Disopyramide, Quinidine, Procainamide, Lidocaine, Mexiletine, Flecainide, Propafenone)    []   [x]   Patient has received amiodarone therapy in the past 3 months or amiodarone level is greater than 0.3 ng/ml.    Labs:    Component Value Date/Time   K 4.5 06/08/2022 1030   MG 1.3 (L) 06/08/2022 1030     Plan: Select One Calculated CrCl  Dose q12h  [x]  > 60 ml/min 500 mcg  []  40-60 ml/min 250 mcg  []  20-40 ml/min 125 mcg   [x]   Physician selected initial dose within range recommended for patients level of renal function - will monitor for response.  []   Physician selected initial dose outside of range recommended for patients level of renal function - will discuss if the dose should be altered at this time.   Patient has been appropriately anticoagulated with apipxaban 5mg  BID.  Potassium: K >/= 4: Appropriate to initiate Tikosyn, no replacement needed    Magnesium: Mg < 1.3: Hold Tikosyn initiation and give Mg 6 gm IV x1, recheck Mg 4hr after end  of infusion - repeat appropriate dose if not > 1.8       Leota Sauers Pharm.D. CPP, BCPS Clinical Pharmacist [phone] 06/08/2022 12:49 PM

## 2022-06-08 NOTE — Progress Notes (Signed)
Primary Care Physician: Gordan Payment., MD Primary Cardiologist: none Primary Electrophysiologist: Dr Elberta Fortis Referring Physician: Dr Ludwig Lean is a 72 y.o. male with a history of CAD, COPD, HLD, SVT, DM, OSA, hyperthyroidism, HTN, atrial flutter, atrial fibrillation who presents for follow up in the [facility]. Patient is s/p SVT ablation in 2007 and afib/flutter ablation in 2011 with Dr Johney Frame. Patient is on Eliquis for a CHADS2VASC score of 4.  On follow up 03/26/22, patient reports that for the last 6-8 weeks he has been having symptoms of palpitations, chest discomfort, intermittent dizziness, and labile heart rates. He is in afib today. He has been under stress lately after his brother passed away unexpectedly. No bleeding issues on anticoagulation.   On follow up 06/08/22, patient is currently in Afib with RVR here for Tikosyn admission. He underwent DCCV on 3/19 with successful conversion to NSR x 2 shocks. Seen by Dr. Elberta Fortis on 3/26 and was back in Afib at that time. Feels fatigued and short of breath when in Afib. He is on oxygen for COPD so ablation was deferred and amiodarone to be avoided. Pharmacist reviewed med list and advised trazodone and lexapro be transitioned. Patient stopped lexapro on his own and required slow wean and transitioned to wellbutrin and buspar. No missed doses of anticoagulation. No benadryl use.   Today, he denies symptoms of shortness of breath, orthopnea, PND, lower extremity edema, presyncope, syncope, snoring, daytime somnolence, bleeding, or neurologic sequela. The patient is tolerating medications without difficulties and is otherwise without complaint today.    Atrial Fibrillation Risk Factors:  he does have symptoms or diagnosis of sleep apnea. he is not compliant with CPAP therapy. he does not have a history of rheumatic fever. The patient does have a history of early familial atrial fibrillation or  other arrhythmias.  he has a BMI of Body mass index is 30.9 kg/m.Marland Kitchen Filed Weights   06/08/22 0919  Weight: 103.3 kg    Family History  Problem Relation Age of Onset   Emphysema Mother    Heart attack Father        CVA   Hypertension Father    Diabetes Father    Heart disease Father        MI   Emphysema Maternal Grandfather        Smoker   Heart disease Paternal Grandfather    Colon cancer Neg Hx    Esophageal cancer Neg Hx    Inflammatory bowel disease Neg Hx    Liver disease Neg Hx    Pancreatic cancer Neg Hx    Rectal cancer Neg Hx    Stomach cancer Neg Hx      Atrial Fibrillation Management history:  Previous antiarrhythmic drugs: amiodarone Previous cardioversions: 04/13/22 Previous ablations: 2007 SVT, 2011 afib and flutter CHADS2VASC score: 4 Anticoagulation history: warfarin, Eliquis   Past Medical History:  Diagnosis Date   Anemia    Arthritis    Asthma    CAD (coronary artery disease)    a. S/P PCI mid LAD (vision stent) 08/31/2004;  b. repeat cath 6/11 - no progression of CAD. c. Cath 10/2012 given moderate CAD on cardiac CT - no change from prior cath.   Chest pain 12/16/2016   COPD (chronic obstructive pulmonary disease) (HCC)    COVID-19 virus infection    Diverticulitis    DJD (degenerative joint disease) of lumbar spine    Dyslipidemia    Gallstones  GERD (gastroesophageal reflux disease)    History of kidney stones    Hypertension    Hyperthyroidism    Melanoma (HCC)    melanoma removed from neck    On home oxygen therapy    "2L q hs" (11/08/2012),  06/04/19 pt. not on oxygen   OSA (obstructive sleep apnea)    a. cpap noncompliance- patient reports on 12/10/14- does not use CPAP   Palpitations    a. 10/2012 - s/p LINQ loop recorder to assess for arrhythmia.    Pancreatitis    Paroxysmal A-fib (HCC)    a. S/P PVI 2/11 and 9/11   PICC (peripherally inserted central catheter) in place    Pneumonia    SVT (supraventricular tachycardia)     a. Atypical AVNRT of the slow pathway   Type II diabetes mellitus [facility])    Past Surgical History:  Procedure Laterality Date   ATRIAL ABLATION SURGERY  2007   S/P slow pathway ablation for atypical AVNRT    BIOPSY  07/17/2018   Procedure: BIOPSY;  Surgeon: Lemar Lofty., MD;  Location: [facility] ENDOSCOPY;  Service: Gastroenterology;;   CARDIOVERSION N/A 04/13/2022   Procedure: CARDIOVERSION;  Surgeon: Chrystie Nose, MD;  Location: [facility] ENDOSCOPY;  Service: Cardiovascular;  Laterality: N/A;   CHOLECYSTECTOMY  ~ 2008   COLON RESECTION  02/2013    COLONOSCOPY  12/25/2007   Colonic polyp status post polypectomy. Internal hemorrhoids.    CORONARY ANGIOPLASTY WITH STENT PLACEMENT  08/30/2004   Vision; to LAD    CYSTOSCOPY W/ STONE MANIPULATION  ~ 1998   "once" (11/08/2012)   EP Study  2008   Negative EP study in [place]   ESOPHAGOGASTRODUODENOSCOPY (EGD) WITH PROPOFOL N/A 07/17/2018   Procedure: ESOPHAGOGASTRODUODENOSCOPY (EGD) WITH PROPOFOL;  Surgeon: Lemar Lofty., MD;  Location: [facility] ENDOSCOPY;  Service: Gastroenterology;  Laterality: N/A;   FRACTURE SURGERY     LEFT HEART CATHETERIZATION WITH CORONARY ANGIOGRAM N/A 11/10/2012   Procedure: LEFT HEART CATHETERIZATION WITH CORONARY ANGIOGRAM;  Surgeon: Kathleene Hazel, MD;  Location: [facility] CATH LAB;  Service: Cardiovascular;  Laterality: N/A;   LOOP RECORDER IMPLANT N/A 11/10/2012   Medtronic LinQ implanted by Dr Johney Frame for afib management   LUMBAR DISC SURGERY  2000; 2002   POSTERIOR LUMBAR FUSION  2004   PROSTATE SURGERY  ~ 2009   PVI/ CTI ablation  02/25/2009 and 09/2009   S/P afib/ CTI ablation   SPLENECTOMY, TOTAL N/A 06/07/2019   Procedure: SPLENECTOMY;  Surgeon: Almond Lint, MD;  Location: [facility] OR;  Service: General;  Laterality: N/A;   TIBIA FRACTURE SURGERY Right 1990   "broke in 3 places; had rod put in" (11/08/2012)   TIBIA HARDWARE REMOVAL Right ~ 1991   TONSILLECTOMY  1965   TOTAL HIP ARTHROPLASTY Right  12/18/2014   Procedure: RIGHT TOTAL HIP ARTHROPLASTY ANTERIOR APPROACH;  Surgeon: Ollen Gross, MD;  Location: [facility] ORS;  Service: Orthopedics;  Laterality: Right;   UPPER ESOPHAGEAL ENDOSCOPIC ULTRASOUND (EUS) N/A 07/17/2018   Procedure: UPPER ESOPHAGEAL ENDOSCOPIC ULTRASOUND (EUS);  Surgeon: Lemar Lofty., MD;  Location: [facility] ENDOSCOPY;  Service: Gastroenterology;  Laterality: N/A;    Current Outpatient Medications  Medication Sig Dispense Refill   acetaminophen (TYLENOL) 500 MG tablet Take 1,000 mg by mouth every 6 (six) hours as needed for mild pain, fever or headache.     apixaban (ELIQUIS) 5 MG TABS tablet Take 1 tablet by mouth twice daily 60 tablet 6   baclofen (LIORESAL) 10 MG tablet Take 10  mg by mouth at bedtime.     buPROPion (WELLBUTRIN XL) 150 MG 24 hr tablet Take 150 mg by mouth daily.     busPIRone (BUSPAR) 5 MG tablet Take 5 mg by mouth every 8 (eight) hours as needed.     Cyanocobalamin (VITAMIN B-12 IJ) Inject 1,000 mcg as directed every 30 (thirty) days.     docusate sodium (COLACE) 100 MG capsule Take 1 capsule (100 mg total) by mouth 2 (two) times daily as needed for mild constipation. (Patient taking differently: Take 100 mg by mouth 2 (two) times daily.) 10 capsule 0   fenofibrate 160 MG tablet Take 160 mg by mouth daily.     ferrous sulfate 325 (65 FE) MG tablet Take 325 mg by mouth daily with breakfast.     furosemide (LASIX) 20 MG tablet Take 40 mg by mouth daily as needed for fluid or edema.     gabapentin (NEURONTIN) 300 MG capsule Take 1 capsule (300 mg total) by mouth 3 (three) times daily. 90 capsule 0   Insulin Glargine (BASAGLAR KWIKPEN) 100 UNIT/ML Inject 35 Units into the skin in the morning.     Insulin Pen Needle 29G X MISC 1 Device by Does not apply route 3 (three) times daily as needed (for use with insulin pen). 100 each 0   JARDIANCE 25 MG TABS tablet Take 25 mg by mouth daily.     losartan (COZAAR) 25 MG tablet Take 1/2 (one-half) tablet by  mouth once daily 45 tablet 0   Melatonin 10 MG TABS Take 20 mg by mouth at bedtime.     metFORMIN (GLUCOPHAGE) 1000 MG tablet Take 1,000 mg by mouth 2 (two) times daily.     metoprolol tartrate (LOPRESSOR) 50 MG tablet Take 1 tablet (50 mg total) by mouth 2 (two) times daily. 180 tablet 2   Multiple Vitamins-Minerals (PRESERVISION AREDS) TABS Take 1 tablet by mouth daily.     nitroGLYCERIN (NITROSTAT) 0.4 MG SL tablet Place 0.4 mg under the tongue every 5 (five) minutes as needed for chest pain.     Oxycodone HCl 10 MG TABS Take 1 tablet every 4-6 hours as needed     oxyCODONE-acetaminophen (PERCOCET) 10-325 MG tablet Take 1 tablet every 4-6 hours as needed     OXYGEN Inhale 2 L into the lungs continuous.     pantoprazole (PROTONIX) 40 MG tablet Take 1 tablet (40 mg total) by mouth 2 (two) times daily. 180 tablet 3   Tamsulosin HCl (FLOMAX) 0.4 MG CAPS Take 0.4 mg by mouth daily.     zolpidem (AMBIEN) 5 MG tablet Take 0.5 tablets (2.5 mg total) by mouth at bedtime as needed for sleep. (Patient taking differently: Take 5 mg by mouth at bedtime as needed for sleep.) 30 tablet 0   No current facility-administered medications for this encounter.    Allergies  Allergen Reactions   Penicillins Hives and Shortness Of Breath    Did it involve swelling of the face/tongue/throat, SOB, or low BP? Yes Did it involve sudden or severe rash/hives, skin peeling, or any reaction on the inside of your mouth or nose? No Did you need to seek medical attention at a hospital or doctor's office? Yes (emergency room) When did it last happen?  1974 If all above answers are "NO", may proceed with cephalosporin use.      Social History   Socioeconomic History   Marital status: Married    Spouse name: Not on file   Number  of children: 1   Years of education: Not on file   Highest education level: Not on file  Occupational History   Occupation: DISABLED    Employer: DISABLED   Occupation: Retired [employer]  Tobacco Use   Smoking status: Former    Packs/day: 3.00    Years: 38.00    Additional pack years: 0.00    Total pack years: 114.00    Types: Cigarettes    Quit date: 03/25/2004    Years since quitting: 18.2   Smokeless tobacco: Former    Types: Chew    Quit date: 04/25/2004   Tobacco comments:    Former smoker 03/26/22  Vaping Use   Vaping Use: Never used  Substance and Sexual Activity   Alcohol use: Not Currently    Comment:  "quit drinking in 1989"   Drug use: No   Sexual activity: Not Currently  Other Topics Concern   Not on file  Social History Narrative   Resides in [place] with his wife   Two children and two grandchildren   Attends [employer]   Social Determinants of Health   Financial Resource Strain: Not on file  Food Insecurity: Not on file  Transportation Needs: Not on file  Physical Activity: Not on file  Stress: Not on file  Social Connections: Not on file  Intimate Partner Violence: Not on file     ROS- All systems are reviewed and negative except as per the HPI above.  Physical Exam: Vitals:   06/08/22 0919  BP: 102/66  Pulse: (!) 129  Weight: 103.3 kg  Height: 6' (1.829 m)   GEN- The patient is well appearing, alert and oriented x 3 today.   Head- normocephalic, atraumatic Eyes-  Sclera clear, conjunctiva pink Ears- hearing intact Oropharynx- clear Neck- supple, no JVP Lymph- no cervical lymphadenopathy Lungs- Clear to ausculation bilaterally, normal work of breathing Heart- Tachycardic irregular rate and rhythm, no murmurs, rubs or gallops, PMI not laterally displaced GI- soft, NT, ND, + BS Extremities- no clubbing, cyanosis, or edema MS- no significant deformity or atrophy Skin- no rash or lesion Psych- euthymic mood, full affect Neuro- strength and sensation are intact   Wt Readings from Last 3 Encounters:  06/08/22 103.3 kg  04/20/22 110.2 kg  04/13/22 105.2 kg    EKG today demonstrates  Vent. rate  129 BPM PR interval * ms QRS duration 94 ms QT/QTcB 336/492 ms P-R-T axes * -76 64 Atrial fibrillation with rapid ventricular response Left axis deviation Septal infarct , age undetermined ST & T wave abnormality, consider anterior ischemia Abnormal ECG When compared with ECG of 13-Apr-2022 11:23, PREVIOUS ECG IS PRESENT  Echo 06/13/20 demonstrated   1. Left ventricular ejection fraction, by estimation, is 60 to 65%. The  left ventricle has normal function. The left ventricle has no regional  wall motion abnormalities. Left ventricular diastolic parameters are  indeterminate.   2. Right ventricular systolic function is normal. The right ventricular  size is normal.   3. Left atrial size was mildly dilated.   4. Right atrial size was mildly dilated.   5. The mitral valve is grossly normal. No evidence of mitral valve  regurgitation.   6. The aortic valve is grossly normal. Aortic valve regurgitation is not  visualized. No aortic stenosis is present.   7. Aortic dilatation noted. There is moderate dilatation of the ascending  aorta, measuring 46 mm. There is moderate dilatation of the aortic root,  measuring  46 mm.   8. The inferior vena cava is normal in size with greater than 50%  respiratory variability, suggesting right atrial pressure of 3 mmHg.   Comparison(s): A prior study was performed on 12/17/2016. Prior images  reviewed side by side. Increase in aortic root and ascending aortic  measurements from prior study.   Epic records are reviewed at length today  CHA2DS2-VASc Score = 4  The patient's score is based upon: CHF History: 0 HTN History: 1 Diabetes History: 1 Stroke History: 0 Vascular Disease History: 1 Age Score: 1 Gender Score: 0       ASSESSMENT AND PLAN: 1. Persistent Atrial Fibrillation/atrial flutter The patient's CHA2DS2-VASc score is 4, indicating a 4.8% annual risk of stroke.    He is in Afib with RVR today.  Patient would like to pursue  dofetilide and presents today for dofetilide admission. Continue Eliquis 5 mg BID, states no missed doses in the last 3 weeks. No recent benadryl use PharmD has screened medications - he is on buspar and wellbutrin now.  QTc in SR 444 ms (ECG 05/04/21) Labs today show creatinine at 1.37, K+ 4.5 and mag 1.3, CrCl calculated at 73  He is eligible for Tikosyn 500 mcg BID dose.   2. Secondary Hypercoagulable State (ICD10:  D68.69) The patient is at significant risk for stroke/thromboembolism based upon his CHA2DS2-VASc Score of 4.  Continue Apixaban (Eliquis).  No missed doses.   3. Obstructive sleep apnea Patient has not been on CPAP for several years.     He will go to admissions.    Lake Bells, PA-C Afib Clinic [facility] [address] [place], [place] [address] [phone] 06/08/2022 10:07 AM

## 2022-06-09 DIAGNOSIS — I4819 Other persistent atrial fibrillation: Secondary | ICD-10-CM | POA: Diagnosis not present

## 2022-06-09 LAB — BASIC METABOLIC PANEL
Anion gap: 10 (ref 5–15)
BUN: 14 mg/dL (ref 8–23)
CO2: 24 mmol/L (ref 22–32)
Calcium: 8.6 mg/dL — ABNORMAL LOW (ref 8.9–10.3)
Chloride: 105 mmol/L (ref 98–111)
Creatinine, Ser: 1.14 mg/dL (ref 0.61–1.24)
GFR, Estimated: >60 mL/min (ref >60)
Glucose, Bld: 49 mg/dL — ABNORMAL LOW (ref 70–99)
Potassium: 4.1 mmol/L (ref 3.5–5.1)
Sodium: 139 mmol/L (ref 135–145)

## 2022-06-09 LAB — MAGNESIUM: Magnesium: 2 mg/dL (ref 1.7–2.4)

## 2022-06-09 LAB — HEMOGLOBIN A1C
Hgb A1c MFr Bld: 6.1 % — ABNORMAL HIGH (ref 4.8–5.6)
Mean Plasma Glucose: 128.37 mg/dL

## 2022-06-09 LAB — GLUCOSE, CAPILLARY
Glucose-Capillary: 104 mg/dL — ABNORMAL HIGH (ref 70–99)
Glucose-Capillary: 135 mg/dL — ABNORMAL HIGH (ref 70–99)
Glucose-Capillary: 99 mg/dL (ref 70–99)

## 2022-06-09 MED ORDER — METOPROLOL TARTRATE 50 MG PO TABS
50.0000 mg | ORAL_TABLET | Freq: Three times a day (TID) | ORAL | Status: DC
  Administered 2022-06-09 – 2022-06-12 (×9): 50 mg via ORAL
  Filled 2022-06-09 (×9): qty 1

## 2022-06-09 MED ORDER — BISACODYL 5 MG PO TBEC: 5.0000 mg | DELAYED_RELEASE_TABLET | Freq: Every day | ORAL | Status: DC | PRN

## 2022-06-09 MED ORDER — INSULIN ASPART 100 UNIT/ML IJ SOLN
0.0000 [IU] | Freq: Three times a day (TID) | INTRAMUSCULAR | Status: DC
  Administered 2022-06-10 (×2): 2 [IU] via SUBCUTANEOUS
  Administered 2022-06-11 (×2): 3 [IU] via SUBCUTANEOUS
  Administered 2022-06-12: 2 [IU] via SUBCUTANEOUS

## 2022-06-09 MED ORDER — INSULIN GLARGINE-YFGN 100 UNIT/ML ~~LOC~~ SOLN
15.0000 [IU] | Freq: Every day | SUBCUTANEOUS | Status: DC
  Administered 2022-06-11 – 2022-06-12 (×2): 15 [IU] via SUBCUTANEOUS
  Filled 2022-06-09 (×3): qty 0.15

## 2022-06-09 MED ORDER — MAGNESIUM SULFATE 2 GM/50ML IV SOLN
2.0000 g | Freq: Once | INTRAVENOUS | Status: AC
  Administered 2022-06-09: 2 g via INTRAVENOUS
  Filled 2022-06-09: qty 50

## 2022-06-09 MED ORDER — INSULIN ASPART 100 UNIT/ML IJ SOLN: 0.0000 [IU] | Freq: Every day | INTRAMUSCULAR | Status: DC

## 2022-06-09 NOTE — Progress Notes (Addendum)
Rounding Note    Patient Name: Steve Anthony Date of Encounter: 06/09/2022  [facility] Cardiologist: Dr. Elberta Fortis  Subjective   "I'm fine"  Inpatient Medications    Scheduled Meds:  apixaban  5 mg Oral BID   baclofen  10 mg Oral QHS   buPROPion  150 mg Oral Daily   dofetilide  500 mcg Oral BID   empagliflozin  25 mg Oral Daily   fenofibrate  160 mg Oral Daily   ferrous sulfate  325 mg Oral Q breakfast   gabapentin  300 mg Oral BID   insulin glargine-yfgn  35 Units Subcutaneous Daily   losartan  12.5 mg Oral Daily   melatonin  20 mg Oral QHS   metFORMIN  1,000 mg Oral BID WC   metoprolol tartrate  50 mg Oral BID   pantoprazole  40 mg Oral BID   sodium chloride flush  3 mL Intravenous Q12H   tamsulosin  0.4 mg Oral Daily   Continuous Infusions:  sodium chloride     magnesium sulfate bolus IVPB     PRN Meds: sodium chloride, acetaminophen, bisacodyl, busPIRone, docusate sodium, nitroGLYCERIN, oxyCODONE, sodium chloride flush, zolpidem   Vital Signs    Vitals:   06/08/22 2003 06/08/22 2130 06/08/22 2341 06/09/22 0425  BP: (!) 127/93  (!) 130/98 (!) 135/99  Pulse: 78  (!) 108 96  Resp: 18  18 18   Temp: 97.9 F (36.6 C)  97.8 F (36.6 C) 97.8 F (36.6 C)  TempSrc: Oral  Oral Oral  SpO2: 97%  97% 96%  Weight:  103.3 kg    Height:  6' (1.829 m)      Intake/Output Summary (Last 24 hours) at 06/09/2022 0811 Last data filed at 06/09/2022 0700 Gross per 24 hour  Intake 600 ml  Output 475 ml  Net 125 ml      06/08/2022    9:30 PM 06/08/2022    9:19 AM 04/20/2022    3:55 PM  Last 3 Weights  Weight (lbs) 227 lb 12.8 oz 227 lb 12.8 oz 243 lb  Weight (kg) 103.329 kg 103.329 kg 110.224 kg      Telemetry    AFib 90's, occ PVCs, noted pre drug as well - Personally Reviewed  ECG    AFib 100bpm, QTc 487  - Personally Reviewed with Dr. Elberta Fortis  Physical Exam   GEN: No acute distress.   Neck: No JVD Cardiac: irreg-irreg, no murmurs, rubs, or  gallops.  Respiratory: CTA b/l, not wheezing GI: Soft, nontender, non-distended  MS: No edema; No deformity. Neuro:  Nonfocal  Psych: Normal affect   Labs    High Sensitivity Troponin:  No results for input(s): "TROPONINIHS" in the last 720 hours.   Chemistry Recent Labs  Lab 06/08/22 1030 06/08/22 1908 06/09/22 0309  NA 138  --  139  K 4.5  --  4.1  CL 105  --  105  CO2 24  --  24  GLUCOSE 134*  --  49*  BUN 13  --  14  CREATININE 1.37*  --  1.14  CALCIUM 8.4*  --  8.6*  MG 1.3* 2.4 2.0  GFRNONAA 55*  --  >60  ANIONGAP 9  --  10    Lipids No results for input(s): "CHOL", "TRIG", "HDL", "LABVLDL", "LDLCALC", "CHOLHDL" in the last 168 hours.  HematologyNo results for input(s): "WBC", "RBC", "HGB", "HCT", "MCV", "MCH", "MCHC", "RDW", "PLT" in the last 168 hours. Thyroid No results for  input(s): "TSH", "FREET4" in the last 168 hours.  BNPNo results for input(s): "BNP", "PROBNP" in the last 168 hours.  DDimer No results for input(s): "DDIMER" in the last 168 hours.   Radiology    No results found.  Cardiac Studies   TTE 06/13/20   1. Left ventricular ejection fraction, by estimation, is 60 to 65%. The  left ventricle has normal function. The left ventricle has no regional  wall motion abnormalities. Left ventricular diastolic parameters are  indeterminate.   2. Right ventricular systolic function is normal. The right ventricular  size is normal.   3. Left atrial size was mildly dilated.   4. Right atrial size was mildly dilated.   5. The mitral valve is grossly normal. No evidence of mitral valve  regurgitation.   6. The aortic valve is grossly normal. Aortic valve regurgitation is not  visualized. No aortic stenosis is present.   7. Aortic dilatation noted. There is moderate dilatation of the ascending  aorta, measuring 46 mm. There is moderate dilatation of the aortic root,  measuring 46 mm.   8. The inferior vena cava is normal in size with greater than 50%   respiratory variability, suggesting right atrial pressure of 3 mmHg.    Coronary CT 07/17/2020 1. Unable to determine coronary calcium score due to LAD stent, but total calcium score in RCA and LCX is 1316, which would be 91st percentile for age and sex matched control even with excluding the LAD 2. Normal coronary origin with right dominance. 3. Calcified plaque in the mid LCX appears to cause severe (70-99%) stenosis, though may be overestimating due to blooming artifact from significant calcifications. Unable to send for CTFFR due to LAD stent. Recommend stress testing to evaluate for ischemia. 4. Mid LAD stent appears patent. There is an area of low attenuation suggesting in-stent restenosis but does not appear to be causing significant stenosis. 5. Calcified plaque causing mild (25-49%) stenosis in the proximal/mid/distal RCA, proximal LCX, and proximal LAD 6.  Dilated ascending aorta measures 43mm   Myoview 11/21/2020   The study is normal. The study is low risk.   No ST deviation was noted.   LV perfusion is normal. There is no evidence of ischemia. There is no evidence of infarction.   Left ventricular function is normal. Nuclear stress EF: 63 %. The left ventricular ejection fraction is normal (55-65%). End diastolic cavity size is normal. End systolic cavity size is normal.   Prior study available for comparison from 12/17/2016. No changes compared to prior study.    Patient Profile     72 y.o. male  w/PMHx CAD (PCI 2006 LAD, notes report cath Oct 2014 revealed stable CAD with patent stent to mid LAD), COPD, HTN, HLD, OSA (not on CPAP), DM, AFIB, flutter (s/p CTI/PVI ablation 02/2009 and 09/2009),SVT (AVNRT slow pathway ablated 2007),  recurrent pancreatitis and pancreatic pseudocysts >>> s/p distal pancreatectomy, splenectomy, debridement of pancreatic necrosis 06/07/2019 (peri-op orthostatic hypotension required midodrine for a while) recurrent AFib admitted for Tikosyn  initiation  Assessment & Plan    Persistent AFib CHA2DS2Vasc is 4, on Eliquis, appropriately dosed Tikosyn load is in progress K+ 4.1 Mag 2.0creat 1.14 (stable) QTc acceptable, difficult in AFib  DCCV in AM if not in SR, pt aware and agreeable  CAD Home meds  HTN Home meds  COPD Home O2/regime    For questions or updates, please contact [facility] Please consult [email or web address] for contact info under  Signed, Sheilah Pigeon, PA-C  06/09/2022, 8:11 AM    I have seen and examined this patient with Francis Dowse.  Agree with above, note added to reflect my findings.  Patient feels mildly dizzy but otherwise without complaint.  Remains in atrial fibrillation.  GEN: Well nourished, well developed, in no acute distress  HEENT: normal  Neck: no JVD, carotid bruits, or masses Cardiac: Irregular; no murmurs, rubs, or gallops,no edema  Respiratory:  clear to auscultation bilaterally, normal work of breathing GI: soft, nontender, nondistended, + BS MS: no deformity or atrophy  Skin: warm and dry Neuro:  Strength and sensation are intact Psych: euthymic mood, full affect   Persistent atrial fibrillation: Admission for dofetilide load.  QTc remained stable.  Potassium magnesium within reasonable limits.  Continue with current dose.  Likely cardioversion tomorrow if he does not convert to sinus rhythm.  Kierre Hintz M. Becker Christopher MD 06/09/2022 11:41 AM

## 2022-06-09 NOTE — Progress Notes (Addendum)
Post dose EKG is reviewed with Dr. Elberta Fortis Afib 85bpm Accurate QTc in AFib is difficult but appears shorter then machine read.  In review of telemetry, RVR with activity, within these rapid rates has NSWCT I suspect are aberrancy, though will review with Dr. Elberta Fortis for final clearance for this evening's dose  Francis Dowse, PA-C  Tele observations and tracings reviewed with Dr. Elberta Fortis Agreed with increasing rate lopressor and OK to continue Tikosyn dose for tonight  Francis Dowse, PA-C

## 2022-06-09 NOTE — Inpatient Diabetes Management (Signed)
Inpatient Diabetes Program Recommendations  AACE/ADA: New Consensus Statement on Inpatient Glycemic Control (2015)  Target Ranges:  Prepandial:   less than 140 mg/dL      Peak postprandial:   less than 180 mg/dL (1-2 hours)      Critically ill patients:  140 - 180 mg/dL   Lab Results  Component Value Date   GLUCAP 234 (H) 01/07/2020   HGBA1C 11.8 (H) 01/01/2020    Review of Glycemic Control  Latest Reference Range & Units 06/09/22 03:09  Glucose 70 - 99 mg/dL 49 (L)  (L): Data is abnormally low  Diabetes history: DM2 Outpatient Diabetes medications: Basaglar 35 units QD, Jardiance 25 mg QD, Metformin 1000 mg BID Current orders for Inpatient glycemic control: Semglee 35 units QD, Jardiance 25 units QD, Metformin 1000 mg BID  Inpatient Diabetes Program Recommendations:    Please consider:  Novolog 0-9 units TID and 0-5 units QHS Semglee 15 units QAM (50% of home dose)  Serum glucose was 49 mg/dL this am with no follow up CBG.  Will continue to follow while inpatient.  Thank you, Dulce Sellar, MSN, CDCES Diabetes Coordinator Inpatient Diabetes Program [phone] (team pager from 8a-5p)

## 2022-06-09 NOTE — Progress Notes (Signed)
Initial Nutrition Assessment  DOCUMENTATION CODES:   Not applicable  INTERVENTION:  MVI   Monitor po intake for need of ONS    NUTRITION DIAGNOSIS:   Unintentional weight loss related to decreased appetite as evidenced by percent weight loss.   GOAL:   Patient will meet greater than or equal to 90% of their needs   MONITOR:   PO intake, Weight trends, Skin, I & O's, Labs  REASON FOR ASSESSMENT:   Malnutrition Screening Tool    ASSESSMENT:   72 y.o. male with PMHx including CAD, COPD, HLD, SVT, DM, OSA, hyperthyroidism, HTN, a-flutter who presents with a-fib in RVR.  Fatigued and SOB  Patient has been stressed due to recent death of his brother   Labs: Glu 49, last hga1c in 2021 11.8 Meds: jardiance, ferrous sulfate, insulin, glucophage, protonix, NS, oxycodone PRN  Wt: 7kg (6%) wt loss x 2 months  PO: 100% meal intake x 1 documented meal on 5/14 I/O's: +128 ml   Plans to be NPO at [place] for cardioversion tomorrow    NUTRITION - FOCUSED PHYSICAL EXAM:  RD working remotely   Diet Order:   Diet Order             Diet NPO time specified Except for: Sips with Meds  Diet effective midnight           Diet Heart Room service appropriate? Yes; Fluid consistency: Thin  Diet effective now                   EDUCATION NEEDS:      Skin:  Skin Assessment: Reviewed RN Assessment  Last BM:  unknown  Height:   Ht Readings from Last 1 Encounters:  06/08/22 6' (1.829 m)    Weight:   Wt Readings from Last 1 Encounters:  06/08/22 103.3 kg    Ideal Body Weight:     BMI:  Body mass index is 30.9 kg/m.  Estimated Nutritional Needs:   Kcal:  2000-2400 kcal  Protein:  120-150 g  Fluid:  >2L    Leodis Rains, RDN, LDN  Clinical Nutrition

## 2022-06-09 NOTE — Progress Notes (Signed)
Pharmacy: Dofetilide (Tikosyn) - Follow Up Assessment and Electrolyte Replacement  Pharmacy consulted to assist in monitoring and replacing electrolytes in this 72 y.o. male admitted on 06/08/2022 undergoing dofetilide initiation.   Labs:    Component Value Date/Time   K 4.1 06/09/2022 0309   MG 2.0 06/09/2022 0309     Plan: Potassium: K >/= 4: No additional supplementation needed  Magnesium: Mg 1.8-2: Give Mg 2 gm IV x1    Thank you for allowing pharmacy to participate in this patient's care    Harland German, PharmD Clinical Pharmacist **Pharmacist phone directory can now be found on [email or web address] ([email or web address]).  Listed under [facility].

## 2022-06-10 ENCOUNTER — Encounter (HOSPITAL_COMMUNITY)
Admission: AD | Disposition: A | Discharge status: Home / Self Care | Payer: Self-pay | Source: Ambulatory Visit | Attending: Internal Medicine

## 2022-06-10 ENCOUNTER — Inpatient Hospital Stay (HOSPITAL_COMMUNITY): Admit: 2022-06-10 | Payer: Medicare HMO | Admitting: Cardiovascular Disease

## 2022-06-10 ENCOUNTER — Encounter (HOSPITAL_COMMUNITY): Payer: Self-pay | Admitting: Cardiology

## 2022-06-10 ENCOUNTER — Inpatient Hospital Stay (HOSPITAL_COMMUNITY): Payer: Medicare HMO | Admitting: Registered Nurse

## 2022-06-10 DIAGNOSIS — I25119 Atherosclerotic heart disease of native coronary artery with unspecified angina pectoris: Secondary | ICD-10-CM

## 2022-06-10 DIAGNOSIS — Z794 Long term (current) use of insulin: Secondary | ICD-10-CM

## 2022-06-10 DIAGNOSIS — I129 Hypertensive chronic kidney disease with stage 1 through stage 4 chronic kidney disease, or unspecified chronic kidney disease: Secondary | ICD-10-CM

## 2022-06-10 DIAGNOSIS — N189 Chronic kidney disease, unspecified: Secondary | ICD-10-CM

## 2022-06-10 DIAGNOSIS — Z87891 Personal history of nicotine dependence: Secondary | ICD-10-CM

## 2022-06-10 DIAGNOSIS — I4819 Other persistent atrial fibrillation: Secondary | ICD-10-CM | POA: Diagnosis not present

## 2022-06-10 DIAGNOSIS — I4891 Unspecified atrial fibrillation: Secondary | ICD-10-CM

## 2022-06-10 DIAGNOSIS — E1122 Type 2 diabetes mellitus with diabetic chronic kidney disease: Secondary | ICD-10-CM

## 2022-06-10 HISTORY — PX: CARDIOVERSION: SHX1299

## 2022-06-10 LAB — BASIC METABOLIC PANEL
Anion gap: 9 (ref 5–15)
BUN: 13 mg/dL (ref 8–23)
CO2: 24 mmol/L (ref 22–32)
Calcium: 8.6 mg/dL — ABNORMAL LOW (ref 8.9–10.3)
Chloride: 103 mmol/L (ref 98–111)
Creatinine, Ser: 1.09 mg/dL (ref 0.61–1.24)
GFR, Estimated: >60 mL/min (ref >60)
Glucose, Bld: 57 mg/dL — ABNORMAL LOW (ref 70–99)
Potassium: 4 mmol/L (ref 3.5–5.1)
Sodium: 136 mmol/L (ref 135–145)

## 2022-06-10 LAB — GLUCOSE, CAPILLARY
Glucose-Capillary: 123 mg/dL — ABNORMAL HIGH (ref 70–99)
Glucose-Capillary: 69 mg/dL — ABNORMAL LOW (ref 70–99)
Glucose-Capillary: 79 mg/dL (ref 70–99)
Glucose-Capillary: 80 mg/dL (ref 70–99)
Glucose-Capillary: 165 mg/dL — ABNORMAL HIGH (ref 70–99)
Glucose-Capillary: 223 mg/dL — ABNORMAL HIGH (ref 70–99)
Glucose-Capillary: 154 mg/dL — ABNORMAL HIGH (ref 70–99)
Glucose-Capillary: 134 mg/dL — ABNORMAL HIGH (ref 70–99)

## 2022-06-10 LAB — URINALYSIS, COMPLETE (UACMP) WITH MICROSCOPIC
Bacteria, UA: NONE SEEN
Bilirubin Urine: NEGATIVE
Glucose, UA: >=500 mg/dL — AB
Ketones, ur: NEGATIVE mg/dL
Leukocytes,Ua: NEGATIVE
Nitrite: NEGATIVE
Protein, ur: NEGATIVE mg/dL
RBC / HPF: >50 RBC/hpf (ref 0–5)
Specific Gravity, Urine: 1.017 (ref 1.005–1.030)
pH: 7 (ref 5.0–8.0)

## 2022-06-10 LAB — MAGNESIUM: Magnesium: 1.8 mg/dL (ref 1.7–2.4)

## 2022-06-10 SURGERY — CARDIOVERSION
Anesthesia: General

## 2022-06-10 MED ORDER — SODIUM CHLORIDE 0.9 % IV SOLN: INTRAVENOUS | Status: DC

## 2022-06-10 MED ORDER — DILTIAZEM HCL-DEXTROSE 125-5 MG/125ML-% IV SOLN (PREMIX)
5.0000 mg/h | INTRAVENOUS | Status: DC
  Administered 2022-06-10 – 2022-06-11 (×2): 5 mg/h via INTRAVENOUS
  Filled 2022-06-10: qty 125

## 2022-06-10 MED ORDER — DILTIAZEM HCL-DEXTROSE 125-5 MG/125ML-% IV SOLN (PREMIX)
INTRAVENOUS | Status: AC
  Filled 2022-06-10: qty 125

## 2022-06-10 MED ORDER — PROPOFOL 10 MG/ML IV BOLUS
INTRAVENOUS | Status: DC | PRN
  Administered 2022-06-10: 50 mg via INTRAVENOUS

## 2022-06-10 MED ORDER — DILTIAZEM LOAD VIA INFUSION
10.0000 mg | Freq: Once | INTRAVENOUS | Status: AC
  Administered 2022-06-10: 10 mg via INTRAVENOUS
  Filled 2022-06-10: qty 10

## 2022-06-10 MED ORDER — MAGNESIUM SULFATE 2 GM/50ML IV SOLN
2.0000 g | Freq: Once | INTRAVENOUS | Status: AC
  Administered 2022-06-10: 2 g via INTRAVENOUS
  Filled 2022-06-10: qty 50

## 2022-06-10 MED ORDER — DEXTROSE 50 % IV SOLN
INTRAVENOUS | Status: AC
  Administered 2022-06-10: 25 mL
  Filled 2022-06-10: qty 50

## 2022-06-10 SURGICAL SUPPLY — 1 items: ELECT DEFIB PAD ADLT CADENCE (PAD) ×2 IMPLANT

## 2022-06-10 NOTE — CV Procedure (Signed)
[facility]: Anesthesia:  Dr Maple Hudson Propofol On Rx anticoagulation no missed doses  DCC x 1 150J biphasic Converted from afib rate 88 to NSR rate 64 bpm  No immediate neurologic sequelae  Charlton Haws MD [facility]

## 2022-06-10 NOTE — Anesthesia Preprocedure Evaluation (Addendum)
Anesthesia Evaluation  Patient identified by MRN, date of birth, ID band Patient awake    Reviewed: Allergy & Precautions, NPO status , Patient's Chart, lab work & pertinent test results  History of Anesthesia Complications Negative for: history of anesthetic complications  Airway Mallampati: III  TM Distance: >3 FB Neck ROM: Full    Dental  (+) Edentulous Upper, Edentulous Lower, Dental Advisory Given   Pulmonary shortness of breath, with exertion and Long-Term Oxygen Therapy, asthma , sleep apnea , COPD, former smoker   breath sounds clear to auscultation       Cardiovascular hypertension, Pt. on medications and Pt. on home beta blockers + angina  + CAD  + dysrhythmias Atrial Fibrillation  Rhythm:Irregular  1. Left ventricular ejection fraction, by estimation, is 60 to 65%. The  left ventricle has normal function. The left ventricle has no regional  wall motion abnormalities. Left ventricular diastolic parameters are  indeterminate.   2. Right ventricular systolic function is normal. The right ventricular  size is normal.   3. Left atrial size was mildly dilated.   4. Right atrial size was mildly dilated.   5. The mitral valve is grossly normal. No evidence of mitral valve  regurgitation.   6. The aortic valve is grossly normal. Aortic valve regurgitation is not  visualized. No aortic stenosis is present.   7. Aortic dilatation noted. There is moderate dilatation of the ascending  aorta, measuring 46 mm. There is moderate dilatation of the aortic root,  measuring 46 mm.   8. The inferior vena cava is normal in size with greater than 50%  respiratory variability, suggesting right atrial pressure of 3 mmHg.     Neuro/Psych negative neurological ROS  negative psych ROS   GI/Hepatic Neg liver ROS,GERD  Medicated and Controlled,,  Endo/Other  diabetes, Type 2, Insulin Dependent    Renal/GU CRFRenal diseaseLab Results       Component                Value               Date                      CREATININE               1.09                06/10/2022                Musculoskeletal  (+) Arthritis ,    Abdominal   Peds  Hematology Lab Results      Component                Value               Date                      WBC                      9.1                 04/09/2022                HGB                      13.0                04/09/2022  HCT                      42.5                04/09/2022                MCV                      92.2                04/09/2022                PLT                      390                 04/09/2022            eliquis   Anesthesia Other Findings   Reproductive/Obstetrics                              Anesthesia Physical Anesthesia Plan  ASA: 3  Anesthesia Plan: General   Post-op Pain Management: Minimal or no pain anticipated   Induction: Intravenous  PONV Risk Score and Plan: 2 and Treatment may vary due to age or medical condition  Airway Management Planned: Natural Airway, Nasal Cannula and Mask  Additional Equipment: None  Intra-op Plan:   Post-operative Plan:   Informed Consent: I have reviewed the patients History and Physical, chart, labs and discussed the procedure including the risks, benefits and alternatives for the proposed anesthesia with the patient or authorized representative who has indicated his/her understanding and acceptance.     Dental advisory given  Plan Discussed with: CRNA  Anesthesia Plan Comments:        Anesthesia Quick Evaluation

## 2022-06-10 NOTE — Progress Notes (Signed)
Dr. Nelly Laurence has seen the patient Discussed felt to be a failure of Tikosyn. Only other AAD option is amiodarone, discussed possible toxicities particularly pulmonary. They are in agreement to transition to amiodarone, at least in the short term to f/u with Dr. Elberta Fortis out patient to further discuss if he is felt an ablation candidate He does feel worse and his O2 requirements are higher when in Afib  Francis Dowse, PA-C

## 2022-06-10 NOTE — Interval H&P Note (Signed)
History and Physical Interval Note:  06/10/2022 7:19 AM  Steve Anthony  has presented today for surgery, with the diagnosis of AFIB.  The various methods of treatment have been discussed with the patient and family. After consideration of risks, benefits and other options for treatment, the patient has consented to  Procedure(s): CARDIOVERSION (N/A) as a surgical intervention.  The patient's history has been reviewed, patient examined, no change in status, stable for surgery.  I have reviewed the patient's chart and labs.  Questions were answered to the patient's satisfaction.     Charlton Haws

## 2022-06-10 NOTE — Progress Notes (Signed)
Pt's CBG is 69. Pt NPO for cardioversion. Administered 12.5 g dextrose given. Cath lab RN at bedside and aware.

## 2022-06-10 NOTE — Progress Notes (Signed)
Pharmacy: Dofetilide (Tikosyn) - Follow Up Assessment and Electrolyte Replacement  Pharmacy consulted to assist in monitoring and replacing electrolytes in this 72 y.o. male admitted on 06/08/2022 undergoing dofetilide initiation.   Labs:    Component Value Date/Time   K 4.0 06/10/2022 0311   MG 1.8 06/10/2022 1610     Plan: Potassium: K >/= 4: No additional supplementation needed  Magnesium: Mg 1.8-2: Give Mg 2 gm IV x1    -He has not needed any potassium supplementation and would recommend no supplement at discharge   Thank you for allowing pharmacy to participate in this patient's care   Harland German, PharmD Clinical Pharmacist **Pharmacist phone directory can now be found on [email or web address] ([email or web address]).  Listed under [facility].

## 2022-06-10 NOTE — Progress Notes (Signed)
Patient complaining of pain when urinating and blood in urine.  Blood noted on rim of urinal, no signs of skin break down on penis.  Notified Marjie Skiff PA via secure chat, order received for UA.

## 2022-06-10 NOTE — Progress Notes (Addendum)
Metformin and Simglee held this AM per Francis Dowse PA and Zerita Boers RN (DM coordinator). Communication received in secure chat.

## 2022-06-10 NOTE — Transfer of Care (Signed)
Immediate Anesthesia Transfer of Care Note  Patient: Steve Anthony  Procedure(s) Performed: CARDIOVERSION  Patient Location: PACU and Cath Lab  Anesthesia Type:MAC  Level of Consciousness: drowsy and patient cooperative  Airway & Oxygen Therapy: Patient connected to nasal cannula oxygen  Post-op Assessment: Report given to RN and Post -op Vital signs reviewed and stable  Post vital signs: Reviewed and stable  Last Vitals:  Vitals Value Taken Time  BP    Temp    Pulse    Resp    SpO2      Last Pain:  Vitals:   06/10/22 0715  TempSrc: Temporal  PainSc: 0-No pain      Patients Stated Pain Goal: 4 (06/09/22 1756)  Complications: There were no known notable events for this encounter.

## 2022-06-10 NOTE — Progress Notes (Addendum)
Called with HRs 150's,  Narrow complex tachycardia on telemetry, appears AFlutter vs rapid AFib, 130's-150s SBP 110 He felt it start, gives him a little discomfort in his chest that he says is typical when his rates are fast Not SOB Skin is warm, dry, well perfused Will start dilt gtt given rates 150's-160 currently Will revisit AAD   Note his BS lower again this AM, DM RN advised some changes (done ) 2/2 low AM BS yesterday Last CBG was 80, AM metformin held  Steve Dowse, PA-C  Pt has failed Tikosyn with quick recurrence of AF. Will hold Tikosyn today and begin amiodarone tomorrow.  Maurice Small, MD

## 2022-06-10 NOTE — Progress Notes (Signed)
   06/10/22 1030  Assess: MEWS Score  BP 106/88  Pulse Rate (!) 166  Assess: MEWS Score  MEWS Temp 0  MEWS Systolic 0  MEWS Pulse 3  MEWS RR 0  MEWS LOC 0  MEWS Score 3  MEWS Score Color Yellow  Assess: if the MEWS score is Yellow or Red  Were vital signs taken at a resting state? Yes  Focused Assessment Change from prior assessment (see assessment flowsheet)  Does the patient meet 2 or more of the SIRS criteria? No  MEWS guidelines implemented  Yes, yellow  Treat  MEWS Interventions Considered administering scheduled or prn medications/treatments as ordered  Take Vital Signs  Increase Vital Sign Frequency  Yellow: Q2hr x1, continue Q4hrs until patient remains green for 12hrs  Escalate  MEWS: Escalate Yellow: Discuss with charge nurse and consider notifying provider and/or RRT  Notify: Charge Nurse/RN  Name of Charge Nurse/RN Notified Shanda Bumps Jonte Wollam rn  Provider Notification  Provider Name/Title renee Keitha Butte pa  Date Provider Notified 06/10/22  Time Provider Notified 1030  Method of Notification Page  Notification Reason Other (Comment) (increased HR after DCCV)  Provider response Other (Comment);See new orders (came to department)  Date of Provider Response 06/10/22  Time of Provider Response 1400  Assess: SIRS CRITERIA  SIRS Temperature  0  SIRS Pulse 1  SIRS Respirations  0  SIRS WBC 0  SIRS Score Sum  1

## 2022-06-10 NOTE — Progress Notes (Addendum)
Rounding Note    Patient Name: Steve Anthony Date of Encounter: 06/09/2022  [facility] Cardiologist: Dr. Elberta Fortis  Subjective   Hungry, otherwise "OK", hoping his rhythm holds  Inpatient Medications    Scheduled Meds:  apixaban  5 mg Oral BID   baclofen  10 mg Oral QHS   buPROPion  150 mg Oral Daily   dofetilide  500 mcg Oral BID   empagliflozin  25 mg Oral Daily   fenofibrate  160 mg Oral Daily   ferrous sulfate  325 mg Oral Q breakfast   gabapentin  300 mg Oral BID   insulin glargine-yfgn  35 Units Subcutaneous Daily   losartan  12.5 mg Oral Daily   melatonin  20 mg Oral QHS   metFORMIN  1,000 mg Oral BID WC   metoprolol tartrate  50 mg Oral BID   pantoprazole  40 mg Oral BID   sodium chloride flush  3 mL Intravenous Q12H   tamsulosin  0.4 mg Oral Daily   Continuous Infusions:  sodium chloride     magnesium sulfate bolus IVPB     PRN Meds: sodium chloride, acetaminophen, bisacodyl, busPIRone, docusate sodium, nitroGLYCERIN, oxyCODONE, sodium chloride flush, zolpidem   Vital Signs    Vitals:   06/08/22 2003 06/08/22 2130 06/08/22 2341 06/09/22 0425  BP: (!) 127/93  (!) 130/98 (!) 135/99  Pulse: 78  (!) 108 96  Resp: 18  18 18   Temp: 97.9 F (36.6 C)  97.8 F (36.6 C) 97.8 F (36.6 C)  TempSrc: Oral  Oral Oral  SpO2: 97%  97% 96%  Weight:  103.3 kg    Height:  6' (1.829 m)      Intake/Output Summary (Last 24 hours) at 06/09/2022 0811 Last data filed at 06/09/2022 0700 Gross per 24 hour  Intake 600 ml  Output 475 ml  Net 125 ml      06/08/2022    9:30 PM 06/08/2022    9:19 AM 04/20/2022    3:55 PM  Last 3 Weights  Weight (lbs) 227 lb 12.8 oz 227 lb 12.8 oz 243 lb  Weight (kg) 103.329 kg 103.329 kg 110.224 kg      Telemetry    AFib 90's-120's, nonsustained WCT, is monomorphic and suspect to be rate related BBB not VT >>> SR - Personally Reviewed with Dr. Nelly Laurence  ECG    AFib 103, manually measured AT , QTc 472, PVCs -  Personally Reviewed with Dr. Elberta Fortis  Physical Exam   GEN: No acute distress.   Neck: No JVD Cardiac: RRR no murmurs, rubs, or gallops.  Respiratory: CTA b/l, not wheezing GI: Soft, nontender, non-distended  MS: No edema; No deformity. Neuro:  Nonfocal  Psych: Normal affect   Labs    High Sensitivity Troponin:  No results for input(s): "TROPONINIHS" in the last 720 hours.   Chemistry Recent Labs  Lab 06/08/22 1030 06/08/22 1908 06/09/22 0309  NA 138  --  139  K 4.5  --  4.1  CL 105  --  105  CO2 24  --  24  GLUCOSE 134*  --  49*  BUN 13  --  14  CREATININE 1.37*  --  1.14  CALCIUM 8.4*  --  8.6*  MG 1.3* 2.4 2.0  GFRNONAA 55*  --  >60  ANIONGAP 9  --  10    Lipids No results for input(s): "CHOL", "TRIG", "HDL", "LABVLDL", "LDLCALC", "CHOLHDL" in the last 168 hours.  HematologyNo results  for input(s): "WBC", "RBC", "HGB", "HCT", "MCV", "MCH", "MCHC", "RDW", "PLT" in the last 168 hours. Thyroid No results for input(s): "TSH", "FREET4" in the last 168 hours.  BNPNo results for input(s): "BNP", "PROBNP" in the last 168 hours.  DDimer No results for input(s): "DDIMER" in the last 168 hours.   Radiology    No results found.  Cardiac Studies   TTE 06/13/20   1. Left ventricular ejection fraction, by estimation, is 60 to 65%. The  left ventricle has normal function. The left ventricle has no regional  wall motion abnormalities. Left ventricular diastolic parameters are  indeterminate.   2. Right ventricular systolic function is normal. The right ventricular  size is normal.   3. Left atrial size was mildly dilated.   4. Right atrial size was mildly dilated.   5. The mitral valve is grossly normal. No evidence of mitral valve  regurgitation.   6. The aortic valve is grossly normal. Aortic valve regurgitation is not  visualized. No aortic stenosis is present.   7. Aortic dilatation noted. There is moderate dilatation of the ascending  aorta, measuring 46 mm. There is  moderate dilatation of the aortic root,  measuring 46 mm.   8. The inferior vena cava is normal in size with greater than 50%  respiratory variability, suggesting right atrial pressure of 3 mmHg.    Coronary CT 07/17/2020 1. Unable to determine coronary calcium score due to LAD stent, but total calcium score in RCA and LCX is 1316, which would be 91st percentile for age and sex matched control even with excluding the LAD 2. Normal coronary origin with right dominance. 3. Calcified plaque in the mid LCX appears to cause severe (70-99%) stenosis, though may be overestimating due to blooming artifact from significant calcifications. Unable to send for CTFFR due to LAD stent. Recommend stress testing to evaluate for ischemia. 4. Mid LAD stent appears patent. There is an area of low attenuation suggesting in-stent restenosis but does not appear to be causing significant stenosis. 5. Calcified plaque causing mild (25-49%) stenosis in the proximal/mid/distal RCA, proximal LCX, and proximal LAD 6.  Dilated ascending aorta measures 43mm   Myoview 11/21/2020   The study is normal. The study is low risk.   No ST deviation was noted.   LV perfusion is normal. There is no evidence of ischemia. There is no evidence of infarction.   Left ventricular function is normal. Nuclear stress EF: 63 %. The left ventricular ejection fraction is normal (55-65%). End diastolic cavity size is normal. End systolic cavity size is normal.   Prior study available for comparison from 12/17/2016. No changes compared to prior study.    Patient Profile     72 y.o. male  w/PMHx CAD (PCI 2006 LAD, notes report cath Oct 2014 revealed stable CAD with patent stent to mid LAD), COPD, HTN, HLD, OSA (not on CPAP), DM, AFIB, flutter (s/p CTI/PVI ablation 02/2009 and 09/2009),SVT (AVNRT slow pathway ablated 2007),  recurrent pancreatitis and pancreatic pseudocysts >>> s/p distal pancreatectomy, splenectomy, debridement of  pancreatic necrosis 06/07/2019 (peri-op orthostatic hypotension required midodrine for a while) recurrent AFib admitted for Tikosyn initiation  Assessment & Plan    Persistent AFib CHA2DS2Vasc is 4, on Eliquis, appropriately dosed Tikosyn load is in progress K+ 4.0 Mag 1.8, replaced creat 1.09 (stable) Post dose EKG was reviewed with Dr. Nelly Laurence, QTc appeared stable Post DCCV this AM Qtc is  Anticipate discharge tomorrow  CAD No symptoms Home meds  HTN  Lopressor increased yesterday, follow  COPD Home O2/regime    For questions or updates, please contact [facility] Please consult [email or web address] for contact info under        Signed, Sheilah Pigeon, PA-C  06/09/2022, 8:11 AM

## 2022-06-10 NOTE — Care Management (Signed)
1442 06-10-22 Case Manager spoke with the patient regarding co pay cost. Patient is agreeable to cost and would like to have the initial Rx filled via [facility] and the Rx refills escribed to [facility] [address], [place], [place] [address]. No further needs identified at this time.

## 2022-06-10 NOTE — Progress Notes (Signed)
   Notified by RN that patient was complaining of some burning with urination and blood was noted on the urinal. Per RN, "no obvious skin breakdown on penis to explain this." Given dysuria and blood on urinal, will order urinalysis.   Corrin Parker, PA-C 06/10/2022 7:18 PM

## 2022-06-11 DIAGNOSIS — I4819 Other persistent atrial fibrillation: Secondary | ICD-10-CM | POA: Diagnosis not present

## 2022-06-11 LAB — HEPATIC FUNCTION PANEL
ALT: 16 U/L (ref 0–44)
AST: 25 U/L (ref 15–41)
Albumin: 3.7 g/dL (ref 3.5–5.0)
Alkaline Phosphatase: 32 U/L — ABNORMAL LOW (ref 38–126)
Bilirubin, Direct: 0.3 mg/dL — ABNORMAL HIGH (ref 0.0–0.2)
Indirect Bilirubin: 0.6 mg/dL (ref 0.3–0.9)
Total Bilirubin: 0.9 mg/dL (ref 0.3–1.2)
Total Protein: 6.7 g/dL (ref 6.5–8.1)

## 2022-06-11 LAB — BASIC METABOLIC PANEL
Anion gap: 8 (ref 5–15)
BUN: 12 mg/dL (ref 8–23)
CO2: 24 mmol/L (ref 22–32)
Calcium: 8.8 mg/dL — ABNORMAL LOW (ref 8.9–10.3)
Chloride: 104 mmol/L (ref 98–111)
Creatinine, Ser: 1.2 mg/dL (ref 0.61–1.24)
GFR, Estimated: >60 mL/min (ref >60)
Glucose, Bld: 105 mg/dL — ABNORMAL HIGH (ref 70–99)
Potassium: 4 mmol/L (ref 3.5–5.1)
Sodium: 136 mmol/L (ref 135–145)

## 2022-06-11 LAB — GLUCOSE, CAPILLARY
Glucose-Capillary: 90 mg/dL (ref 70–99)
Glucose-Capillary: 214 mg/dL — ABNORMAL HIGH (ref 70–99)
Glucose-Capillary: 196 mg/dL — ABNORMAL HIGH (ref 70–99)

## 2022-06-11 LAB — MAGNESIUM: Magnesium: 1.6 mg/dL — ABNORMAL LOW (ref 1.7–2.4)

## 2022-06-11 LAB — TSH: TSH: 1.955 u[IU]/mL (ref 0.350–4.500)

## 2022-06-11 MED ORDER — MAGNESIUM SULFATE 4 GM/100ML IV SOLN
4.0000 g | Freq: Once | INTRAVENOUS | Status: AC
  Administered 2022-06-11: 4 g via INTRAVENOUS
  Filled 2022-06-11: qty 100

## 2022-06-11 MED ORDER — AMIODARONE HCL 200 MG PO TABS
400.0000 mg | ORAL_TABLET | Freq: Two times a day (BID) | ORAL | Status: DC
  Administered 2022-06-11 – 2022-06-12 (×3): 400 mg via ORAL
  Filled 2022-06-11 (×3): qty 2

## 2022-06-11 MED ORDER — MAGNESIUM SULFATE 2 GM/50ML IV SOLN: 2.0000 g | Freq: Once | INTRAVENOUS | Status: DC

## 2022-06-11 MED ORDER — MAGNESIUM OXIDE -MG SUPPLEMENT 400 (240 MG) MG PO TABS
400.0000 mg | ORAL_TABLET | Freq: Every day | ORAL | Status: DC
  Administered 2022-06-11 – 2022-06-12 (×2): 400 mg via ORAL
  Filled 2022-06-11 (×2): qty 1

## 2022-06-11 NOTE — Plan of Care (Signed)

## 2022-06-11 NOTE — Progress Notes (Addendum)
Rounding Note    Patient Name: Steve Anthony Date of Encounter: 06/11/2022  [facility] Cardiologist: Dr. Elberta Fortis  Subjective   Reports some burning with urination yesterday and some blood in the urinal, reports hx of renal stones, though no pain, back/flank symptoms, no CP, no unusual SOB  Inpatient Medications    Scheduled Meds:  apixaban  5 mg Oral BID   baclofen  10 mg Oral QHS   buPROPion  150 mg Oral Daily   empagliflozin  25 mg Oral Daily   fenofibrate  160 mg Oral Daily   ferrous sulfate  325 mg Oral Q breakfast   gabapentin  300 mg Oral BID   insulin aspart  0-5 Units Subcutaneous QHS   insulin aspart  0-9 Units Subcutaneous TID WC   insulin glargine-yfgn  15 Units Subcutaneous Daily   losartan  12.5 mg Oral Daily   melatonin  20 mg Oral QHS   metFORMIN  1,000 mg Oral BID WC   metoprolol tartrate  50 mg Oral TID   pantoprazole  40 mg Oral BID   sodium chloride flush  3 mL Intravenous Q12H   tamsulosin  0.4 mg Oral Daily   Continuous Infusions:  sodium chloride Stopped (06/10/22 1218)   diltiazem (CARDIZEM) infusion 5 mg/hr (06/11/22 0527)   magnesium sulfate bolus IVPB     PRN Meds: sodium chloride, acetaminophen, bisacodyl, busPIRone, docusate sodium, nitroGLYCERIN, oxyCODONE, sodium chloride flush, zolpidem   Vital Signs    Vitals:   06/10/22 2053 06/10/22 2200 06/10/22 2202 06/11/22 0500  BP: 109/73 (!) 129/90 (!) 129/90 127/78  Pulse: 86 (!) 132 73 64  Resp: 18 16  18   Temp: 98.6 F (37 C)   98.1 F (36.7 C)  TempSrc: Oral   Oral  SpO2: 94% 90%  95%  Weight:      Height:        Intake/Output Summary (Last 24 hours) at 06/11/2022 0812 Last data filed at 06/11/2022 0547 Gross per 24 hour  Intake 464.11 ml  Output 1500 ml  Net -1035.89 ml      06/08/2022    9:30 PM 06/08/2022    9:19 AM 04/20/2022    3:55 PM  Last 3 Weights  Weight (lbs) 227 lb 12.8 oz 227 lb 12.8 oz 243 lb  Weight (kg) 103.329 kg 103.329 kg 110.224 kg       Telemetry    AFib 90's infrequent PVCs >>> SR - Personally Reviewed with Dr. Nelly Laurence  ECG   No new EKGs  Physical Exam   GEN: No acute distress.   Neck: No JVD Cardiac: irreg-irreg no murmurs, rubs, or gallops.  Respiratory: CTA b/l, not wheezing GI: Soft, nontender, non-distended  MS: No edema; No deformity. Neuro:  Nonfocal  Psych: Normal affect   Labs    High Sensitivity Troponin:  No results for input(s): "TROPONINIHS" in the last 720 hours.   Chemistry Recent Labs  Lab 06/09/22 0309 06/10/22 0311 06/11/22 0336  NA 139 136 136  K 4.1 4.0 4.0  CL 105 103 104  CO2 24 24 24   GLUCOSE 49* 57* 105*  BUN 14 13 12   CREATININE 1.14 1.09 1.20  CALCIUM 8.6* 8.6* 8.8*  MG 2.0 1.8 1.6*  GFRNONAA >60 >60 >60  ANIONGAP 10 9 8     Lipids No results for input(s): "CHOL", "TRIG", "HDL", "LABVLDL", "LDLCALC", "CHOLHDL" in the last 168 hours.  HematologyNo results for input(s): "WBC", "RBC", "HGB", "HCT", "MCV", "MCH", "MCHC", "RDW", "  PLT" in the last 168 hours. Thyroid No results for input(s): "TSH", "FREET4" in the last 168 hours.  BNPNo results for input(s): "BNP", "PROBNP" in the last 168 hours.  DDimer No results for input(s): "DDIMER" in the last 168 hours.   Radiology    EP STUDY  Result Date: 06/10/2022 See surgical note for result.   Cardiac Studies   TTE 06/13/20   1. Left ventricular ejection fraction, by estimation, is 60 to 65%. The  left ventricle has normal function. The left ventricle has no regional  wall motion abnormalities. Left ventricular diastolic parameters are  indeterminate.   2. Right ventricular systolic function is normal. The right ventricular  size is normal.   3. Left atrial size was mildly dilated.   4. Right atrial size was mildly dilated.   5. The mitral valve is grossly normal. No evidence of mitral valve  regurgitation.   6. The aortic valve is grossly normal. Aortic valve regurgitation is not  visualized. No aortic stenosis is  present.   7. Aortic dilatation noted. There is moderate dilatation of the ascending  aorta, measuring 46 mm. There is moderate dilatation of the aortic root,  measuring 46 mm.   8. The inferior vena cava is normal in size with greater than 50%  respiratory variability, suggesting right atrial pressure of 3 mmHg.    Coronary CT 07/17/2020 1. Unable to determine coronary calcium score due to LAD stent, but total calcium score in RCA and LCX is 1316, which would be 91st percentile for age and sex matched control even with excluding the LAD 2. Normal coronary origin with right dominance. 3. Calcified plaque in the mid LCX appears to cause severe (70-99%) stenosis, though may be overestimating due to blooming artifact from significant calcifications. Unable to send for CTFFR due to LAD stent. Recommend stress testing to evaluate for ischemia. 4. Mid LAD stent appears patent. There is an area of low attenuation suggesting in-stent restenosis but does not appear to be causing significant stenosis. 5. Calcified plaque causing mild (25-49%) stenosis in the proximal/mid/distal RCA, proximal LCX, and proximal LAD 6.  Dilated ascending aorta measures 43mm   Myoview 11/21/2020   The study is normal. The study is low risk.   No ST deviation was noted.   LV perfusion is normal. There is no evidence of ischemia. There is no evidence of infarction.   Left ventricular function is normal. Nuclear stress EF: 63 %. The left ventricular ejection fraction is normal (55-65%). End diastolic cavity size is normal. End systolic cavity size is normal.   Prior study available for comparison from 12/17/2016. No changes compared to prior study.    Patient Profile     72 y.o. 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 If rates  remain controlled PO amio and his metoprolol with plans towards DCCV out patient once amio loaded  Hopeful for discharge tomorrow  CAD No symptoms Home meds  HTN Lopressor increased yesterday, follow  COPD Home O2/regime  5. Dysuria UA not c/w infection, suspect urethral trauma Follow with his PMD  6. DM BS looks ok with current recs from DM RN Suspect he will discharge on his home regime, likely not eating his usual here    For questions or updates, please contact [facility] Please consult [email or web address] for contact info under        Signed, Sheilah Pigeon, PA-C  06/11/2022, 8:12 AM    ATTENDING ATTESTATION  I have seen and examined this patient with the APP.  I have reviewed the attached note and agree with its content unless detailed differently below.     GEN: Well nourished, well developed, in no acute distress  Cardiac: RRR; no murmurs, rubs, or gallops, no edema  Respiratory:  normal work of breathing   Atrial fibrillation Pt had early recurrence of AF after cardioversion on Tikosyn Staring amiodarone; giving PO now since he recently received Tikosyn. Will need to keep a close eye on pulmonary function. PO metoprolol for rate control; wean of diltiazem drip.  Patient has had two ablations in the past   Wide complex beats  Monitor on  amiodarone   COPD On home oxygen

## 2022-06-11 NOTE — Progress Notes (Signed)
Physician notified of bleeding coming from penis after staff called to room. Patient has had complaints of blood in urine and back pain. He has a history of or kidney stones and BPH.

## 2022-06-11 NOTE — Progress Notes (Addendum)
Pharmacy: Dofetilide (Tikosyn) - Follow Up Assessment and Electrolyte Replacement  Pharmacy consulted to assist in monitoring and replacing electrolytes in this 71 y.o. male admitted on 06/08/2022 undergoing dofetilide initiation.   Labs:    Component Value Date/Time   K 4.0 06/11/2022 0336   MG 1.6 (L) 06/11/2022 0336     Plan: Potassium: K >/= 4: No additional supplementation needed  Magnesium: Mg 1.3-1.7: Give Mg 4 gm IV x1   --Plans noted for d/c Tikosyn and starting amiodarone. Will sign off. Please contact pharmacy with any other needs.  Thank you for allowing pharmacy to participate in this patient's care   Harland German, PharmD Clinical Pharmacist **Pharmacist phone directory can now be found on [email or web address] ([email or web address]).  Listed under [facility].

## 2022-06-11 NOTE — Care Management Important Message (Signed)
Important Message  Patient Details  Name: Steve Anthony MRN: [id number] Date of Birth: 30-May-1950   Medicare Important Message Given:  Yes     [name] 06/11/2022, 8:36 AM

## 2022-06-11 NOTE — Anesthesia Postprocedure Evaluation (Signed)
Anesthesia Post Note  Patient: Steve Anthony  Procedure(s) Performed: CARDIOVERSION     Patient location during evaluation: Cath Lab Anesthesia Type: General Level of consciousness: awake and alert Pain management: pain level controlled Vital Signs Assessment: post-procedure vital signs reviewed and stable Respiratory status: spontaneous breathing, nonlabored ventilation and respiratory function stable Cardiovascular status: stable Postop Assessment: no apparent nausea or vomiting Anesthetic complications: no   There were no known notable events for this encounter.  Last Vitals:  Vitals:   06/11/22 0500 06/11/22 1043  BP: 127/78 97/75  Pulse: 64 83  Resp: 18 16  Temp: 36.7 C (!) 36.2 C  SpO2: 95% 93%    Last Pain:  Vitals:   06/11/22 1614  TempSrc:   PainSc: 3                  [name]

## 2022-06-12 ENCOUNTER — Other Ambulatory Visit (HOSPITAL_COMMUNITY): Payer: Self-pay

## 2022-06-12 DIAGNOSIS — I4819 Other persistent atrial fibrillation: Secondary | ICD-10-CM | POA: Diagnosis not present

## 2022-06-12 LAB — GLUCOSE, CAPILLARY
Glucose-Capillary: 118 mg/dL — ABNORMAL HIGH (ref 70–99)
Glucose-Capillary: 166 mg/dL — ABNORMAL HIGH (ref 70–99)

## 2022-06-12 MED ORDER — AMIODARONE HCL 200 MG PO TABS
400.0000 mg | ORAL_TABLET | Freq: Two times a day (BID) | ORAL | 6 refills | Status: DC
  Filled 2022-06-12: qty 80, 30d supply, fill #0

## 2022-06-12 MED ORDER — MAGNESIUM OXIDE 400 MG PO TABS
400.0000 mg | ORAL_TABLET | Freq: Every day | ORAL | 6 refills | Status: DC
  Filled 2022-06-12: qty 30, 30d supply, fill #0

## 2022-06-12 MED ORDER — AMIODARONE HCL 200 MG PO TABS: 400.0000 mg | ORAL_TABLET | Freq: Two times a day (BID) | ORAL | 6 refills | Status: DC

## 2022-06-12 MED ORDER — MAGNESIUM OXIDE -MG SUPPLEMENT 400 (240 MG) MG PO TABS: 400.0000 mg | ORAL_TABLET | Freq: Every day | ORAL | 6 refills | Status: DC

## 2022-06-12 NOTE — Plan of Care (Signed)

## 2022-06-12 NOTE — Discharge Instructions (Signed)
Call for Urology appointment on Monday.

## 2022-06-12 NOTE — Plan of Care (Signed)
Problem: Education: Goal: Knowledge of General Education information will improve Description: Including pain rating scale, medication(s)/side effects and non-pharmacologic comfort measures 06/12/2022 0928 by Herma Carson, RN Outcome: Adequate for Discharge 06/12/2022 0806 by Herma Carson, RN Outcome: Progressing   Problem: Health Behavior/Discharge Planning: Goal: Ability to manage health-related needs will improve 06/12/2022 0928 by Herma Carson, RN Outcome: Adequate for Discharge 06/12/2022 0806 by Herma Carson, RN Outcome: Progressing   Problem: Clinical Measurements: Goal: Ability to maintain clinical measurements within normal limits will improve 06/12/2022 0928 by Herma Carson, RN Outcome: Adequate for Discharge 06/12/2022 0806 by Herma Carson, RN Outcome: Progressing Goal: Will remain free from infection 06/12/2022 0928 by Herma Carson, RN Outcome: Adequate for Discharge 06/12/2022 0806 by Herma Carson, RN Outcome: Progressing Goal: Diagnostic test results will improve 06/12/2022 0928 by Herma Carson, RN Outcome: Adequate for Discharge 06/12/2022 0806 by Herma Carson, RN Outcome: Progressing Goal: Respiratory complications will improve 06/12/2022 0928 by Herma Carson, RN Outcome: Adequate for Discharge 06/12/2022 0806 by Herma Carson, RN Outcome: Progressing Goal: Cardiovascular complication will be avoided 06/12/2022 0928 by Herma Carson, RN Outcome: Adequate for Discharge 06/12/2022 0806 by Herma Carson, RN Outcome: Progressing   Problem: Activity: Goal: Risk for activity intolerance will decrease 06/12/2022 0928 by Herma Carson, RN Outcome: Adequate for Discharge 06/12/2022 0806 by Herma Carson, RN Outcome: Progressing   Problem: Nutrition: Goal: Adequate nutrition will be maintained 06/12/2022 0928 by Herma Carson, RN Outcome: Adequate for Discharge 06/12/2022 0806 by Herma Carson, RN Outcome: Progressing    Problem: Coping: Goal: Level of anxiety will decrease 06/12/2022 0928 by Herma Carson, RN Outcome: Adequate for Discharge 06/12/2022 0806 by Herma Carson, RN Outcome: Progressing   Problem: Elimination: Goal: Will not experience complications related to bowel motility 06/12/2022 0928 by Herma Carson, RN Outcome: Adequate for Discharge 06/12/2022 0806 by Herma Carson, RN Outcome: Progressing Goal: Will not experience complications related to urinary retention 06/12/2022 0928 by Herma Carson, RN Outcome: Adequate for Discharge 06/12/2022 0806 by Herma Carson, RN Outcome: Progressing   Problem: Pain Managment: Goal: General experience of comfort will improve 06/12/2022 0928 by Herma Carson, RN Outcome: Adequate for Discharge 06/12/2022 0806 by Herma Carson, RN Outcome: Progressing   Problem: Safety: Goal: Ability to remain free from injury will improve 06/12/2022 0928 by Herma Carson, RN Outcome: Adequate for Discharge 06/12/2022 0806 by Herma Carson, RN Outcome: Progressing   Problem: Skin Integrity: Goal: Risk for impaired skin integrity will decrease 06/12/2022 0928 by Herma Carson, RN Outcome: Adequate for Discharge 06/12/2022 0806 by Herma Carson, RN Outcome: Progressing   Problem: Education: Goal: Ability to describe self-care measures that may prevent or decrease complications (Diabetes Survival Skills Education) will improve 06/12/2022 0928 by Herma Carson, RN Outcome: Adequate for Discharge 06/12/2022 0806 by Herma Carson, RN Outcome: Progressing Goal: Individualized Educational Video(s) 06/12/2022 0928 by Herma Carson, RN Outcome: Adequate for Discharge 06/12/2022 0806 by Herma Carson, RN Outcome: Progressing   Problem: Coping: Goal: Ability to adjust to condition or change in health will improve 06/12/2022 0928 by Herma Carson, RN Outcome: Adequate for Discharge 06/12/2022 0806 by Herma Carson, RN Outcome:  Progressing   Problem: Fluid Volume: Goal: Ability to maintain a balanced intake and output will improve 06/12/2022 0928 by Herma Carson, RN Outcome: Adequate for Discharge 06/12/2022 0806 by Edrick Oh,  Ree Kida, RN Outcome: Progressing   Problem: Health Behavior/Discharge Planning: Goal: Ability to identify and utilize available resources and services will improve 06/12/2022 0928 by Herma Carson, RN Outcome: Adequate for Discharge 06/12/2022 0806 by Herma Carson, RN Outcome: Progressing Goal: Ability to manage health-related needs will improve 06/12/2022 0928 by Herma Carson, RN Outcome: Adequate for Discharge 06/12/2022 0806 by Herma Carson, RN Outcome: Progressing   Problem: Metabolic: Goal: Ability to maintain appropriate glucose levels will improve 06/12/2022 0928 by Herma Carson, RN Outcome: Adequate for Discharge 06/12/2022 0806 by Herma Carson, RN Outcome: Progressing   Problem: Nutritional: Goal: Maintenance of adequate nutrition will improve 06/12/2022 0928 by Herma Carson, RN Outcome: Adequate for Discharge 06/12/2022 0806 by Herma Carson, RN Outcome: Progressing Goal: Progress toward achieving an optimal weight will improve 06/12/2022 0928 by Herma Carson, RN Outcome: Adequate for Discharge 06/12/2022 0806 by Herma Carson, RN Outcome: Progressing   Problem: Skin Integrity: Goal: Risk for impaired skin integrity will decrease 06/12/2022 0928 by Herma Carson, RN Outcome: Adequate for Discharge 06/12/2022 0806 by Herma Carson, RN Outcome: Progressing   Problem: Tissue Perfusion: Goal: Adequacy of tissue perfusion will improve 06/12/2022 0928 by Herma Carson, RN Outcome: Adequate for Discharge 06/12/2022 0806 by Herma Carson, RN Outcome: Progressing

## 2022-06-12 NOTE — Discharge Summary (Addendum)
Discharge Summary    Patient ID: Steve Anthony MRN: [id number]; DOB: 08/18/1950  Admit date: 06/08/2022 Discharge date: 06/12/2022  PCP:  Default, Provider, MD   [facility] Providers Cardiologist:  None  Electrophysiologist:  Will Jorja Loa, MD       Discharge Diagnoses    Principal Problem:   Persistent atrial fibrillation [facility])    Diagnostic Studies/Procedures    [facility]: 06/10/2022 Anesthesia:  Dr Maple Hudson Propofol On Rx anticoagulation no missed doses   DCC x 1 150J biphasic Converted from afib rate 88 to NSR rate 64 bpm   No immediate neurologic sequelae   Charlton Haws MD [facility] _____________   History of Present Illness     Steve Anthony is a 72 y.o. male with a history of CAD, COPD, HLD, SVT, DM, OSA, hyperthyroidism, HTN, atrial flutter, atrial fibrillation, SVT ablation 2007 and Afib/flutter ablation 2011, who was seen in the Afib Clinic on 05/14 for Tikosyn admission.  Hospital Course     Consultants: none   Pt was loaded w/ Tikosyn x 4 doses and then cardioverted on 05/16. Mg and K+ were supplemented as needed.  He converted back to Afib within 2 hrs. The decision was made to start amio after the Tikosyn cleared.   He was initially on amio 400 mg bid, this will be changed to 400 mg qd in 5 days. Continue this dose.    He tolerated the medication well.   He was changed from coumadin to Eliquis over a year ago, is compliant w/ this. No hx bleeding issues.  Had hematuria and UA was sent. He was not having pain w/ this, UA w/ elevated glucose but no signs infection.   He has had hematuria in the past when passing a kidney stone, but does not believe he is passing one now (no pain).  He associated the hematuria with being on IV Cardizem, and says once the med was stopped, the hematuria resolved. No obvious hematuria, he is encouraged to follow up with urology.  On 05/18, he was seen by Dr Ladona Ridgel and all data were reviewed. No further inpatient  workup is indicated and he is considered stable for d/c, to f/u as an outpt.      _____________  Discharge Vitals Blood pressure 121/89, pulse (!) 108, temperature 97.8 F (36.6 C), temperature source Oral, resp. rate 16, height 6' (1.829 m), weight 103.3 kg, SpO2 98 %.  Filed Weights   06/08/22 2130  Weight: 103.3 kg  General: Well developed, well nourished, male in no acute distress Head: Eyes PERRLA, Head normocephalic and atraumatic Lungs: decreased bs bases bilaterally to auscultation. Heart: Irreg R&R S1 S2, without rub or gallop. No murmur. 4/4 extremity pulses are 2+ & equal. No JVD. Multiple varicose veins Abdomen: Bowel sounds are present, abdomen soft and non-tender without masses or  hernias noted. Msk: Normal strength and tone for age. Extremities: No clubbing, cyanosis or edema.    Skin:  No rashes or lesions noted. Neuro: Alert and oriented X 3. Psych:  Good affect, responds appropriately   Labs & Radiologic Studies    CBC No results for input(s): "WBC", "NEUTROABS", "HGB", "HCT", "MCV", "PLT" in the last 72 hours. Basic Metabolic Panel Recent Labs    29/56/21 0311 06/11/22 0336  NA 136 136  K 4.0 4.0  CL 103 104  CO2 24 24  GLUCOSE 57* 105*  BUN 13 12  CREATININE 1.09 1.20  CALCIUM 8.6* 8.8*  MG 1.8 1.6*  Liver Function Tests Recent Labs    06/11/22 1105  AST 25  ALT 16  ALKPHOS 32*  BILITOT 0.9  PROT 6.7  ALBUMIN 3.7   Urinalysis    Component Value Date/Time   COLORURINE YELLOW 06/10/2022 1917   APPEARANCEUR CLEAR 06/10/2022 1917   LABSPEC 1.017 06/10/2022 1917   PHURINE 7.0 06/10/2022 1917   GLUCOSEU >=500 (A) 06/10/2022 1917   HGBUR MODERATE (A) 06/10/2022 1917   BILIRUBINUR NEGATIVE 06/10/2022 1917   KETONESUR NEGATIVE 06/10/2022 1917   PROTEINUR NEGATIVE 06/10/2022 1917   NITRITE NEGATIVE 06/10/2022 1917   LEUKOCYTESUR NEGATIVE 06/10/2022 1917     High Sensitivity Troponin:   No results for input(s): "TROPONINIHS" in the last  720 hours.  BNP Invalid input(s): "POCBNP" D-Dimer No results for input(s): "DDIMER" in the last 72 hours. Hemoglobin A1C Lab Results  Component Value Date   HGBA1C 6.1 (H) 06/09/2022    Fasting Lipid Panel No results for input(s): "CHOL", "HDL", "LDLCALC", "TRIG", "CHOLHDL", "LDLDIRECT" in the last 72 hours. Thyroid Function Tests Recent Labs    06/11/22 1105  TSH 1.955   _____________  EP STUDY  Result Date: 06/10/2022 See surgical note for result.  Disposition   Pt is being discharged home today in good condition.  Follow-up Plans & Appointments     Follow-up Information     Regan Lemming, MD Follow up.   Specialty: Cardiology Why: The office will call. Contact information: [address] [place] [address] [phone]                Discharge Instructions     Diet Carb Modified   Complete by: As directed    Increase activity slowly   Complete by: As directed         Discharge Medications   Allergies as of 06/12/2022       Reactions   Penicillins Hives, Shortness Of Breath   Did it involve swelling of the face/tongue/throat, SOB, or low BP? Yes Did it involve sudden or severe rash/hives, skin peeling, or any reaction on the inside of your mouth or nose? No Did you need to seek medical attention at a hospital or doctor's office? Yes (emergency room) When did it last happen?  1974 If all above answers are "NO", may proceed with cephalosporin use.        Medication List     TAKE these medications    acetaminophen 500 MG tablet Commonly known as: TYLENOL Take 1,000 mg by mouth every 6 (six) hours as needed for mild pain, fever or headache.   amiodarone 200 MG tablet Commonly known as: PACERONE Take 2 tablets (400 mg total) by mouth 2 (two) times daily. 2 tabs bid x 5 days, then 2 tabs daily   baclofen 10 MG tablet Commonly known as: LIORESAL Take 10 mg by mouth at bedtime.   Basaglar KwikPen 100 UNIT/ML Inject 35  Units into the skin in the morning.   buPROPion 150 MG 24 hr tablet Commonly known as: WELLBUTRIN XL Take 150 mg by mouth daily.   busPIRone 5 MG tablet Commonly known as: BUSPAR Take 5 mg by mouth every 8 (eight) hours as needed (Anxiety).   docusate sodium 100 MG capsule Commonly known as: COLACE Take 1 capsule (100 mg total) by mouth 2 (two) times daily as needed for mild constipation. What changed: when to take this   Eliquis 5 MG Tabs tablet Generic drug: apixaban Take 1 tablet by mouth twice daily  fenofibrate 160 MG tablet Take 160 mg by mouth daily.   ferrous sulfate 325 (65 FE) MG tablet Take 325 mg by mouth daily with breakfast.   furosemide 20 MG tablet Commonly known as: LASIX Take 40 mg by mouth daily as needed for fluid or edema.   gabapentin 300 MG capsule Commonly known as: NEURONTIN Take 1 capsule (300 mg total) by mouth 3 (three) times daily. What changed: when to take this   Insulin Pen Needle 29G X Misc 1 Device by Does not apply route 3 (three) times daily as needed (for use with insulin pen).   Jardiance 25 MG Tabs tablet Generic drug: empagliflozin Take 25 mg by mouth daily.   losartan 25 MG tablet Commonly known as: COZAAR Take 1/2 (one-half) tablet by mouth once daily What changed:  how much to take how to take this when to take this additional instructions   magnesium oxide 400 (240 Mg) MG tablet Commonly known as: MAG-OX Take 1 tablet (400 mg total) by mouth daily.   Melatonin 10 MG Tabs Take 20 mg by mouth at bedtime.   metFORMIN 1000 MG tablet Commonly known as: GLUCOPHAGE Take 1,000 mg by mouth 2 (two) times daily.   metoprolol tartrate 50 MG tablet Commonly known as: LOPRESSOR Take 1 tablet (50 mg total) by mouth 2 (two) times daily.   nitroGLYCERIN 0.4 MG SL tablet Commonly known as: NITROSTAT Place 0.4 mg under the tongue every 5 (five) minutes as needed for chest pain.   Oxycodone HCl 10 MG Tabs Take 1 tablet  by mouth daily as needed (For pain). Take 1 tablet every 4-6 hours as needed   oxyCODONE-acetaminophen 10-325 MG tablet Commonly known as: PERCOCET Take 1 tablet by mouth every 6 (six) hours as needed for pain.   OXYGEN Inhale 2 L into the lungs continuous.   pantoprazole 40 MG tablet Commonly known as: PROTONIX Take 1 tablet (40 mg total) by mouth 2 (two) times daily.   PreserVision AREDS Tabs Take 1 tablet by mouth daily.   tamsulosin 0.4 MG Caps capsule Commonly known as: FLOMAX Take 0.4 mg by mouth daily.   VITAMIN B-12 IJ Inject 1,000 mcg as directed every 30 (thirty) days.   zolpidem 5 MG tablet Commonly known as: Ambien Take 0.5 tablets (2.5 mg total) by mouth at bedtime as needed for sleep. What changed: how much to take        Outstanding Labs/Studies   none  Duration of Discharge Encounter   Greater than 30 minutes including physician time.  Signed, Theodore Demark, PA-C 06/12/2022, 8:34 AM  EP Attending  Patient seen and examined. He has an IRIR rhythm and minimal basilar rales and no edema. Tele with atrial fib with a RVR.  A/P Persistent atrial fib - he has failed dofetilide. He has been started on oral amio and will take for a month and then will need to undergo DCCV. Followup Dr. Elberta Fortis.   Sharlot Gowda Kandon Hosking,MD

## 2022-06-30 ENCOUNTER — Encounter: Payer: Self-pay | Admitting: Cardiology

## 2022-07-02 ENCOUNTER — Other Ambulatory Visit: Payer: Self-pay | Admitting: Surgery

## 2022-07-02 DIAGNOSIS — I712 Thoracic aortic aneurysm, without rupture, unspecified: Secondary | ICD-10-CM

## 2022-07-06 NOTE — Telephone Encounter (Signed)
Spoke to pt and wife. He only stopped taking Amiodarone today. He has been taking it with food.  Pt was taking 400 mg daily per instruction. Wellbutrin was increased not long after re-starting Amiodarone last month.  Wellbutrin was doubled when increased. Pt aware I am going to discuss his case further w/ Dr. Elberta Fortis as I am not sure that Amiodarone was the cause all along. Also discussed/informed that if pt's do have SE when taking loading doses of Amiodarone that usually subsides when decrease to normal dosing of 200 mg once daily.  Aware that we may have him hold Amiodarone this week and me follow up with him next week, but will discuss with MD and let him know. Pt and wife agreeable to plan.

## 2022-07-07 NOTE — Telephone Encounter (Signed)
Followed up with pt who reports he is feeling much improved and was able to eat something without issues. Aware to continue holding Amiodarone for now. Aware I will follow up by end of week/beginning of next and we will determine plan. Patient verbalized understanding and agreeable to plan.

## 2022-07-12 ENCOUNTER — Ambulatory Visit: Payer: Medicare HMO | Admitting: Cardiology

## 2022-07-12 NOTE — Telephone Encounter (Signed)
Followed up with pt who reports he continues to do better. He no longer has nausea/vomiting and is eating without issues. Advised to continue holding Amiodarone until advisement by Dr. Elberta Fortis. Forwarding to him to see if ok to take Amiodarone 200 mg ONCE daily or want to start a different medication.

## 2022-07-16 NOTE — Telephone Encounter (Signed)
Advised to take 1 tablet (200 mg total) once daily. Advised if starts to experience SE again to stop it. Advised to let me know next week how he is doing and if tolerating Amiodarone. Pt and wife agreeable to plan.

## 2022-07-19 ENCOUNTER — Telehealth: Payer: Self-pay | Admitting: Cardiology

## 2022-07-19 NOTE — Telephone Encounter (Signed)
Pt c/o medication issue:  1. Name of Medication:   amiodarone (PACERONE) 200 MG tablet   2. How are you currently taking this medication (dosage and times per day)?   As prescribed  3. Are you having a reaction (difficulty breathing--STAT)? Nauseous  4. What is your medication issue?   Patient stated he had started back on this medication on Friday but it is making him sick.  Patient wants call back to discuss next steps.

## 2022-07-19 NOTE — Telephone Encounter (Signed)
Advised to stop Amiodarone. Aware forwarding to MD for advisement and will be in touch.

## 2022-07-22 ENCOUNTER — Ambulatory Visit: Payer: Medicare HMO | Admitting: Student

## 2022-07-22 NOTE — Telephone Encounter (Signed)
Aware to keep 7/29 OV w/ Camnitz to discuss afib control options. Informed that I will look at schedule on Monday to see if we can work him in that day. Aware I will call him if so, otherwise keep 7/29 appt. Patient verbalized understanding and agreeable to plan.

## 2022-07-26 ENCOUNTER — Encounter: Payer: Self-pay | Admitting: Internal Medicine

## 2022-08-04 ENCOUNTER — Ambulatory Visit: Payer: Medicare HMO | Admitting: Surgery

## 2022-08-04 ENCOUNTER — Ambulatory Visit
Admission: RE | Admit: 2022-08-04 | Discharge: 2022-08-04 | Disposition: A | Discharge status: Home / Self Care | Payer: Medicare HMO | Source: Ambulatory Visit | Attending: Surgery | Admitting: Surgery

## 2022-08-04 DIAGNOSIS — I712 Thoracic aortic aneurysm, without rupture, unspecified: Secondary | ICD-10-CM

## 2022-08-05 ENCOUNTER — Ambulatory Visit: Payer: Medicare HMO | Admitting: Surgery

## 2022-08-05 ENCOUNTER — Encounter: Payer: Self-pay | Admitting: Surgery

## 2022-08-05 VITALS — BP 91/54 | HR 60 | Resp 20 | Ht 72.0 in | Wt 212.0 lb

## 2022-08-05 DIAGNOSIS — I712 Thoracic aortic aneurysm, without rupture, unspecified: Secondary | ICD-10-CM | POA: Diagnosis not present

## 2022-08-06 NOTE — Progress Notes (Signed)
HPI:  This 72 year old gentleman returns today for follow-up of a stable 4.4 cm fusiform ascending aortic aneurysm.  He continues to feel poorly overall with nausea and poor appetite that he thinks may be related to some of his medications.  He has had persistent problems with atrial fibrillation and has been on different medications for that with prior ablations.  He reports losing about 25 pounds since January.  Current Outpatient Medications  Medication Sig Dispense Refill   acetaminophen (TYLENOL) 500 MG tablet Take 1,000 mg by mouth every 6 (six) hours as needed for mild pain, fever or headache.     amiodarone (PACERONE) 200 MG tablet Take 2 tablets (400 mg total) by mouth 2 (two) times daily for 5 days, then take 2 tabs once daily 80 tablet 6   apixaban (ELIQUIS) 5 MG TABS tablet Take 1 tablet by mouth twice daily 60 tablet 6   baclofen (LIORESAL) 10 MG tablet Take 10 mg by mouth at bedtime.     buPROPion (WELLBUTRIN XL) 150 MG 24 hr tablet Take 150 mg by mouth daily.     busPIRone (BUSPAR) 5 MG tablet Take 5 mg by mouth every 8 (eight) hours as needed (Anxiety).     Cyanocobalamin (VITAMIN B-12 IJ) Inject 1,000 mcg as directed every 30 (thirty) days.     docusate sodium (COLACE) 100 MG capsule Take 1 capsule (100 mg total) by mouth 2 (two) times daily as needed for mild constipation. (Patient taking differently: Take 100 mg by mouth 2 (two) times daily.) 10 capsule 0   fenofibrate 160 MG tablet Take 160 mg by mouth daily.     ferrous sulfate 325 (65 FE) MG tablet Take 325 mg by mouth daily with breakfast.     furosemide (LASIX) 20 MG tablet Take 40 mg by mouth daily as needed for fluid or edema.     gabapentin (NEURONTIN) 300 MG capsule Take 1 capsule (300 mg total) by mouth 3 (three) times daily. (Patient taking differently: Take 300 mg by mouth 2 (two) times daily.) 90 capsule 0   Insulin Glargine (BASAGLAR KWIKPEN) 100 UNIT/ML Inject 35 Units into the skin in the morning.      Insulin Pen Needle 29G X MISC 1 Device by Does not apply route 3 (three) times daily as needed (for use with insulin pen). 100 each 0   JARDIANCE 25 MG TABS tablet Take 25 mg by mouth daily.     losartan (COZAAR) 25 MG tablet Take 1/2 (one-half) tablet by mouth once daily (Patient taking differently: Take 12.5 mg by mouth daily.) 45 tablet 0   magnesium oxide (MAG-OX) 400 MG tablet Take 1 tablet (400 mg total) by mouth daily. 30 tablet 6   Melatonin 10 MG TABS Take 20 mg by mouth at bedtime.     metFORMIN (GLUCOPHAGE) 1000 MG tablet Take 1,000 mg by mouth 2 (two) times daily.     metoprolol tartrate (LOPRESSOR) 50 MG tablet Take 1 tablet (50 mg total) by mouth 2 (two) times daily. 180 tablet 2   Multiple Vitamins-Minerals (PRESERVISION AREDS) TABS Take 1 tablet by mouth daily.     nitroGLYCERIN (NITROSTAT) 0.4 MG SL tablet Place 0.4 mg under the tongue every 5 (five) minutes as needed for chest pain.     Oxycodone HCl 10 MG TABS Take 1 tablet by mouth daily as needed (For pain). Take 1 tablet every 4-6 hours as needed     oxyCODONE-acetaminophen (PERCOCET) 10-325 MG tablet Take 1  tablet by mouth every 6 (six) hours as needed for pain.     OXYGEN Inhale 2 L into the lungs continuous.     pantoprazole (PROTONIX) 40 MG tablet Take 1 tablet (40 mg total) by mouth 2 (two) times daily. 180 tablet 3   Tamsulosin HCl (FLOMAX) 0.4 MG CAPS Take 0.4 mg by mouth daily.     zolpidem (AMBIEN) 5 MG tablet Take 0.5 tablets (2.5 mg total) by mouth at bedtime as needed for sleep. (Patient taking differently: Take 5 mg by mouth at bedtime as needed for sleep.) 30 tablet 0   No current facility-administered medications for this visit.     Physical Exam: BP (!) 91/54   Pulse 60   Resp 20   Ht 6' (1.829 m)   Wt 212 lb (96.2 kg)   SpO2 91% Comment: 2L O2 per [place]  BMI 28.75 kg/m  He looks weak. Cardiac exam shows a regular rate and rhythm with normal heart sounds.  There is no murmur. Lungs are  clear. There is no peripheral edema.  Diagnostic Tests:  Narrative & Impression  CLINICAL DATA:  Thoracic aortic aneurysm.  Chest pain.   EXAM: CT CHEST WITHOUT CONTRAST   TECHNIQUE: Multidetector CT imaging of the chest was performed following the standard protocol without IV contrast.   RADIATION DOSE REDUCTION: This exam was performed according to the departmental dose-optimization program which includes automated exposure control, adjustment of the mA and/or kV according to patient size and/or use of iterative reconstruction technique.   COMPARISON:  07/29/2021   FINDINGS: Cardiovascular: Coronary, aortic arch, and branch vessel atherosclerotic vascular disease. Mild cardiomegaly. Ascending thoracic aorta stable at 4.4 cm in diameter on image 74 series 2, compatible with aneurysm. Stable mild prominence of the main pulmonary artery which may reflect pulmonary arterial hypertension.   Mediastinum/Nodes: Small mediastinal lymph nodes are not pathologically enlarged.   Lungs/Pleura: Centrilobular emphysema. Mild lingular scarring and scarring in the posterior basal segment left lower lobe.   Volume loss and nodularity medially in the left upper lobe is new compared to 07/29/2021 with associated nodularity along the mediastinal margin measuring 13 by 7 by 11 mm (mean diameter: 10.3 mm). Given the way that vessels curve into this region for example on images 46 through 52 of series 8, there is clearly some volume loss contributing to this density, and more caudad on image 55 of series 8 the left paramediastinal density has a configuration or characteristic of atelectasis.   Upper Abdomen: Fluid density lesions along the upper poles of the kidneys compatible with benign cysts were visualized. No further imaging workup of these lesions is indicated.   Musculoskeletal: Thoracic spondylosis.   IMPRESSION: 1. Stable ascending thoracic aortic aneurysm at 4.4 cm  diameter. Recommend annual imaging followup by CTA or MRA. This recommendation follows 2010 ACCF/AHA/AATS/ACR/ASA/SCA/SCAI/SIR/STS/SVM Guidelines for the Diagnosis and Management of Patients with Thoracic Aortic Disease. Circulation. 2010; 121: Q259-D638. Aortic aneurysm NOS (ICD10-I71.9) 2. Stable mild prominence of the main pulmonary artery which may reflect pulmonary arterial hypertension. 3. Aortic Atherosclerosis (ICD10-I70.0). Coronary atherosclerosis. Mild cardiomegaly. 4.  Emphysema (ICD10-J43.9). 5. New volume loss and nodularity medially in the left upper lobe with associated nodularity along the mediastinal margin measuring 13 by 7 by 11 mm (mean diameter: 10.3 mm). Consider one of the following in 3 months for both low-risk and high-risk individuals: (a) repeat chest CT, (b) follow-up PET-CT, or (c) tissue sampling. This recommendation follows the consensus statement: Guidelines for Management of Incidental Pulmonary  Nodules Detected on CT Images: From the Fleischner Society 2017; Radiology 2017; [phone]. In general I would recommend repeating chest CT given the indirect indicators that this nodularity is being caused by benign atelectasis.   Aortic Atherosclerosis (ICD10-I70.0) and Emphysema (ICD10-J43.9).     Electronically Signed   By: Gaylyn Rong M.D.   On: 08/04/2022 15:00      Impression:  He has a stable 4.4 cm fusiform ascending aortic aneurysm that is well below the surgical threshold of 5.5 cm.  There is new volume loss and nodularity medially in the left upper lobe with associated nodularity along the mediastinal margin of the lung.  Radiology has recommended a follow-up CT of the chest in 3 months.  I reviewed the CT images with him and his wife and answered their questions.  His blood pressure is running low today and he said that it has been relatively low at home and he is going to follow-up with his PCP concerning his antihypertensive regimen  and his nausea/poor appetite.  Plan:  He will continue to follow-up with his PCP and is seeing electrophysiology in the near future concerning his atrial fibrillation.  I will plan to see him back in 1 year with a CT of the chest for aortic surveillance and we will get a CT of the chest without contrast in 4 months to follow-up on the new left upper lobe nodular densities.  I spent 20 minutes performing this established patient evaluation and > 50% of this time was spent face to face counseling and coordinating the care of this patient's aortic aneurysm and new left upper lobe nodular densities.    Alleen Borne, MD [facility] [phone]

## 2022-08-22 NOTE — Progress Notes (Signed)
Electrophysiology Office Note:   Date:  08/23/2022  ID:  Steve Anthony, DOB 1950-11-25, MRN [id number]  Primary Cardiologist: None Electrophysiologist: Gus Littler Jorja Loa, MD      History of Present Illness:   Steve Anthony is a 72 y.o. male with h/o coronary disease, COPD, hyperlipidemia, SVT, diabetes, OSA, atrial fibrillation/flutter seen today for routine electrophysiology followup.  Since last being seen in our clinic the patient reports doing well.  He was taken off of his amiodarone as he was having nausea and decreased appetite.  His appetite did not improve and he has been switched back to his Lexapro.  Since being on this medication, his appetite has been improving and he has been gaining weight.  He would like to try amiodarone again if it does not interact with his antidepressant.  he denies chest pain, palpitations, dyspnea, PND, orthopnea, nausea, vomiting, dizziness, syncope, edema, weight gain, or early satiety.     He is post SVT ablation in 2007 and atrial fibrillation flutter ablation in 2011.  He presented to the hospital May 2024 for dofetilide load.  He underwent cardioversion but went back into atrial fibrillation and has since been loaded on amiodarone.      Review of systems complete and found to be negative unless listed in HPI.   EP Information / Studies Reviewed:    EKG is ordered today. Personal review as below.  EKG Interpretation Date/Time:  Monday August 23 2022 09:48:59 EDT Ventricular Rate:  104 PR Interval:    QRS Duration:  82 QT Interval:  342 QTC Calculation: 449 R Axis:   -73  Text Interpretation: Atrial fibrillation Left axis deviation Septal infarct , age undetermined ST & T wave abnormality, consider anterolateral ischemia Abnormal ECG When compared with ECG of 10-Jun-2022 12:35, No significant change since last tracing Confirmed by Johnny Gorter ([id number]) on 08/23/2022 10:00:23 AM   Risk Assessment/Calculations:    CHA2DS2-VASc Score = 4    This indicates a 4.8% annual risk of stroke. The patient's score is based upon: CHF History: 0 HTN History: 1 Diabetes History: 1 Stroke History: 0 Vascular Disease History: 1 Age Score: 1 Gender Score: 0      Physical Exam:   VS:  BP (!) 126/90   Pulse (!) 104   Ht 6' (1.829 m)   Wt 209 lb (94.8 kg)   SpO2 94%   BMI 28.35 kg/m    Wt Readings from Last 3 Encounters:  08/23/22 209 lb (94.8 kg)  08/05/22 212 lb (96.2 kg)  06/08/22 227 lb 12.8 oz (103.3 kg)     GEN: Well nourished, well developed in no acute distress NECK: No JVD; No carotid bruits CARDIAC: Irregularly irregular rate and rhythm, no murmurs, rubs, gallops RESPIRATORY:  Clear to auscultation without rales, wheezing or rhonchi  ABDOMEN: Soft, non-tender, non-distended EXTREMITIES:  No edema; No deformity   ASSESSMENT AND PLAN:    1.  Persistent atrial fibrillation: Post ablation in 2011.  CHA2DS2-VASc of 4.  Was previously admitted for dofetilide but failed he was on amiodarone but had nausea and weight loss.  This is since been attributed to his antidepressants.  He is feeling better now.  Would like to try amiodarone again.  Joeleen Wortley check with pharmacies to see if he can tolerate amiodarone.  2.  Hypertension: Elevated diastolic but otherwise well-controlled.  No changes.  3.  Coronary artery disease: Post LAD stent.  Stable angina.  No ischemia on most recent stress test.  4.  Ascending aortic aneurysm: Plan per thoracic surgery  5.  Secondary hypercoagulable state: Currently on Eliquis for atrial fibrillation  Follow up with Dr. Elberta Fortis in 6 months  Signed, Chestine Belknap Jorja Loa, MD

## 2022-08-23 ENCOUNTER — Encounter: Payer: Self-pay | Admitting: Cardiology

## 2022-08-23 ENCOUNTER — Ambulatory Visit: Payer: Medicare HMO | Attending: Student | Admitting: Cardiology

## 2022-08-23 VITALS — BP 126/90 | HR 104 | Ht 72.0 in | Wt 209.0 lb

## 2022-08-23 DIAGNOSIS — D6869 Other thrombophilia: Secondary | ICD-10-CM | POA: Diagnosis not present

## 2022-08-23 DIAGNOSIS — I10 Essential (primary) hypertension: Secondary | ICD-10-CM | POA: Diagnosis not present

## 2022-08-23 DIAGNOSIS — I2510 Atherosclerotic heart disease of native coronary artery without angina pectoris: Secondary | ICD-10-CM

## 2022-08-23 DIAGNOSIS — I4819 Other persistent atrial fibrillation: Secondary | ICD-10-CM

## 2022-08-23 NOTE — Patient Instructions (Addendum)
Medication Instructions:  Your physician recommends that you continue on your current medications as directed. Please refer to the Current Medication list given to you today.  *If you need a refill on your cardiac medications before your next appointment, please call your pharmacy*   Lab Work: None today   Testing/Procedures: None ordered   Follow-Up: At [facility], you and your health needs are our priority.  As part of our continuing mission to provide you with exceptional heart care, we have created designated Provider Care Teams.  These Care Teams include your primary Cardiologist (physician) and Advanced Practice Providers (APPs -  Physician Assistants and Nurse Practitioners) who all work together to provide you with the care you need, when you need it.  You have been referred to establish with a general cardiologist here in [place].   Your next appointment:   6 month(s)  The format for your next appointment:   In Person  Provider:   Loman Brooklyn, MD{    Thank you for choosing [facility]!!   Dory Horn, RN [phone]

## 2022-09-15 ENCOUNTER — Ambulatory Visit: Payer: Medicare HMO

## 2022-09-15 VITALS — BP 118/78 | HR 100 | Ht 72.0 in | Wt 216.8 lb

## 2022-09-15 DIAGNOSIS — I2510 Atherosclerotic heart disease of native coronary artery without angina pectoris: Secondary | ICD-10-CM | POA: Diagnosis not present

## 2022-09-15 DIAGNOSIS — E782 Mixed hyperlipidemia: Secondary | ICD-10-CM

## 2022-09-15 DIAGNOSIS — I712 Thoracic aortic aneurysm, without rupture, unspecified: Secondary | ICD-10-CM | POA: Diagnosis not present

## 2022-09-15 DIAGNOSIS — I10 Essential (primary) hypertension: Secondary | ICD-10-CM

## 2022-09-15 DIAGNOSIS — I4891 Unspecified atrial fibrillation: Secondary | ICD-10-CM

## 2022-09-15 DIAGNOSIS — I4819 Other persistent atrial fibrillation: Secondary | ICD-10-CM

## 2022-09-15 MED ORDER — METOPROLOL TARTRATE 75 MG PO TABS: 75.0000 mg | ORAL_TABLET | Freq: Two times a day (BID) | ORAL | 3 refills | Status: DC

## 2022-09-15 NOTE — Progress Notes (Signed)
Cardiology Consultation:    Date:  09/15/2022   ID:  Steve Anthony, DOB Sep 10, 1950, MRN [id number]  PCP:  Gordan Payment., MD  Cardiologist:  Marlyn Corporal Brittiney Dicostanzo, MD   Referring MD: Regan Lemming, MD   Chief Complaint  Patient presents with   Coronary Artery Disease         History of Present Illness:    Steve Anthony is a 72 y.o. male who is being seen today for the evaluation of CAD at the request of Camnitz, Andree Coss, MD.   Here accompanied for the visit by his wife. He has history of CAD, overweight, COPD on home O2 for the past 5 months, hyperlipidemia, SVT ablation in 2007, diabetes mellitus, obstructive sleep apnea noncompliant with CPAP, hypothyroidism, hypertension, atrial flutter/fibrillation ablation in 2011 with CHA2DS2-VASc score of 4 and on anticoagulation with Eliquis here to establish care for management of his CAD.  Chronic back pain and knee pain with daily use of analgesics including oxycodone.  Mild thoracic aorta dilatation  His functional status is extremely limited due to back pain leg pain and requiring daily analgesics.  Also shortness of breath with baseline use of oxygen at 2 L/min flow for the last 5 months. Denies any chest pain. Mentions significant awareness of elevated heart rates and knows that he has been in A-fib for the past 4 to 5 months consistently.  Denies any recent syncopal episodes. Denies any blood in urine or stools.  Attempts at rhythm control with TEE cardioversion and Tikosyn load were unsuccessful in May 2024 at [facility]. Later transition to amiodarone but this was poorly tolerated due to various symptoms of nausea abdominal pain and discontinued. Subsequently followed up with Dr. Elberta Fortis recently on July 29 and is in the process of reevaluating amiodarone is an option to consider down the road.  Good compliance with all his medications. Blood pressure well-controlled Does take diuretics on an as-needed  basis, typically 2-3 times a week Lasix 40 mg, he determines dose based on his lower extremity edema.  He does not check daily weights at home, mentions weight typically around 215 2 to 17 pounds. Is concerned about hypokalemia with daily consumption of Lasix.    Past Medical History:  Diagnosis Date   Anemia    Arthritis    Asthma    CAD (coronary artery disease)    a. S/P PCI mid LAD (vision stent) 08/31/2004;  b. repeat cath 6/11 - no progression of CAD. c. Cath 10/2012 given moderate CAD on cardiac CT - no change from prior cath.   Chest pain 12/16/2016   COPD (chronic obstructive pulmonary disease) (HCC)    COVID-19 virus infection    Diverticulitis    DJD (degenerative joint disease) of lumbar spine    Dyslipidemia    Gallstones    GERD (gastroesophageal reflux disease)    History of kidney stones    Hypertension    Hyperthyroidism    Melanoma (HCC)    melanoma removed from neck    On home oxygen therapy    "2L q hs" (11/08/2012),  06/04/19 pt. not on oxygen   OSA (obstructive sleep apnea)    a. cpap noncompliance- patient reports on 12/10/14- does not use CPAP   Palpitations    a. 10/2012 - s/p LINQ loop recorder to assess for arrhythmia.    Pancreatitis    Paroxysmal A-fib (HCC)    a. S/P PVI 2/11 and 9/11   PICC (peripherally  inserted central catheter) in place    Pneumonia    SVT (supraventricular tachycardia)    a. Atypical AVNRT of the slow pathway   Type II diabetes mellitus [facility])     Past Surgical History:  Procedure Laterality Date   ATRIAL ABLATION SURGERY  2007   S/P slow pathway ablation for atypical AVNRT    BIOPSY  07/17/2018   Procedure: BIOPSY;  Surgeon: Lemar Lofty., MD;  Location: [facility] ENDOSCOPY;  Service: Gastroenterology;;   CARDIOVERSION N/A 04/13/2022   Procedure: CARDIOVERSION;  Surgeon: Chrystie Nose, MD;  Location: [facility] ENDOSCOPY;  Service: Cardiovascular;  Laterality: N/A;   CARDIOVERSION N/A 06/10/2022   Procedure: CARDIOVERSION;   Surgeon: Wendall Stade, MD;  Location: [facility] INVASIVE CV LAB;  Service: Cardiovascular;  Laterality: N/A;   CHOLECYSTECTOMY  ~ 2008   COLON RESECTION  02/2013    COLONOSCOPY  12/25/2007   Colonic polyp status post polypectomy. Internal hemorrhoids.    CORONARY ANGIOPLASTY WITH STENT PLACEMENT  08/30/2004   Vision; to LAD    CYSTOSCOPY W/ STONE MANIPULATION  ~ 1998   "once" (11/08/2012)   EP Study  2008   Negative EP study in [place]   ESOPHAGOGASTRODUODENOSCOPY (EGD) WITH PROPOFOL N/A 07/17/2018   Procedure: ESOPHAGOGASTRODUODENOSCOPY (EGD) WITH PROPOFOL;  Surgeon: Lemar Lofty., MD;  Location: [facility] ENDOSCOPY;  Service: Gastroenterology;  Laterality: N/A;   FRACTURE SURGERY     LEFT HEART CATHETERIZATION WITH CORONARY ANGIOGRAM N/A 11/10/2012   Procedure: LEFT HEART CATHETERIZATION WITH CORONARY ANGIOGRAM;  Surgeon: Kathleene Hazel, MD;  Location: [facility] CATH LAB;  Service: Cardiovascular;  Laterality: N/A;   LOOP RECORDER IMPLANT N/A 11/10/2012   Medtronic LinQ implanted by Dr Johney Frame for afib management   LUMBAR DISC SURGERY  2000; 2002   POSTERIOR LUMBAR FUSION  2004   PROSTATE SURGERY  ~ 2009   PVI/ CTI ablation  02/25/2009 and 09/2009   S/P afib/ CTI ablation   SPLENECTOMY, TOTAL N/A 06/07/2019   Procedure: SPLENECTOMY;  Surgeon: Almond Lint, MD;  Location: [facility] OR;  Service: General;  Laterality: N/A;   TIBIA FRACTURE SURGERY Right 1990   "broke in 3 places; had rod put in" (11/08/2012)   TIBIA HARDWARE REMOVAL Right ~ 1991   TONSILLECTOMY  1965   TOTAL HIP ARTHROPLASTY Right 12/18/2014   Procedure: RIGHT TOTAL HIP ARTHROPLASTY ANTERIOR APPROACH;  Surgeon: Ollen Gross, MD;  Location: [facility] ORS;  Service: Orthopedics;  Laterality: Right;   UPPER ESOPHAGEAL ENDOSCOPIC ULTRASOUND (EUS) N/A 07/17/2018   Procedure: UPPER ESOPHAGEAL ENDOSCOPIC ULTRASOUND (EUS);  Surgeon: Lemar Lofty., MD;  Location: [facility] ENDOSCOPY;  Service: Gastroenterology;  Laterality: N/A;    Current  Medications: Current Meds  Medication Sig   acetaminophen (TYLENOL) 500 MG tablet Take 1,000 mg by mouth every 6 (six) hours as needed for mild pain, fever or headache.   apixaban (ELIQUIS) 5 MG TABS tablet Take 1 tablet by mouth twice daily (Patient taking differently: Take 5 mg by mouth 2 (two) times daily. Take 1 tablet by mouth twice daily)   baclofen (LIORESAL) 10 MG tablet Take 10 mg by mouth at bedtime.   Cyanocobalamin (VITAMIN B-12 IJ) Inject 1,000 mcg as directed every 30 (thirty) days.   docusate sodium (COLACE) 100 MG capsule Take 1 capsule (100 mg total) by mouth 2 (two) times daily as needed for mild constipation. (Patient taking differently: Take 100 mg by mouth 2 (two) times daily.)   escitalopram (LEXAPRO) 10 MG tablet Take 1 tablet by mouth daily.  fenofibrate 160 MG tablet Take 160 mg by mouth daily.   ferrous sulfate 325 (65 FE) MG tablet Take 325 mg by mouth daily with breakfast.   furosemide (LASIX) 20 MG tablet Take 40 mg by mouth daily as needed for fluid or edema.   gabapentin (NEURONTIN) 300 MG capsule Take 1 capsule (300 mg total) by mouth 3 (three) times daily. (Patient taking differently: Take 300 mg by mouth 2 (two) times daily.)   Insulin Glargine (BASAGLAR KWIKPEN) 100 UNIT/ML Inject 35 Units into the skin in the morning.   Insulin Pen Needle 29G X MISC 1 Device by Does not apply route 3 (three) times daily as needed (for use with insulin pen).   JARDIANCE 25 MG TABS tablet Take 25 mg by mouth daily.   losartan (COZAAR) 25 MG tablet Take 1/2 (one-half) tablet by mouth once daily (Patient taking differently: Take 12.5 mg by mouth daily.)   magnesium oxide (MAG-OX) 400 MG tablet Take 1 tablet (400 mg total) by mouth daily.   Melatonin 10 MG TABS Take 20 mg by mouth at bedtime.   metFORMIN (GLUCOPHAGE) 1000 MG tablet Take 1,000 mg by mouth 2 (two) times daily.   Metoprolol Tartrate 75 MG TABS Take 1 tablet (75 mg total) by mouth 2 (two) times daily.   Multiple  Vitamins-Minerals (PRESERVISION AREDS) TABS Take 1 tablet by mouth daily.   nitroGLYCERIN (NITROSTAT) 0.4 MG SL tablet Place 0.4 mg under the tongue every 5 (five) minutes as needed for chest pain.   Oxycodone HCl 10 MG TABS Take 1 tablet by mouth daily as needed (For pain). Take 1 tablet every 4-6 hours as needed   oxyCODONE-acetaminophen (PERCOCET) 10-325 MG tablet Take 1 tablet by mouth every 6 (six) hours as needed for pain.   OXYGEN Inhale 2 L into the lungs continuous.   pantoprazole (PROTONIX) 40 MG tablet Take 1 tablet (40 mg total) by mouth 2 (two) times daily.   Tamsulosin HCl (FLOMAX) 0.4 MG CAPS Take 0.4 mg by mouth daily.   zolpidem (AMBIEN) 5 MG tablet Take 0.5 tablets (2.5 mg total) by mouth at bedtime as needed for sleep. (Patient taking differently: Take 5 mg by mouth at bedtime as needed for sleep.)   [DISCONTINUED] metoprolol tartrate (LOPRESSOR) 50 MG tablet Take 1 tablet (50 mg total) by mouth 2 (two) times daily.     Allergies:   Penicillins   Social History   Socioeconomic History   Marital status: Married    Spouse name: Not on file   Number of children: 1   Years of education: Not on file   Highest education level: Not on file  Occupational History   Occupation: DISABLED    Employer: DISABLED   Occupation: Retired [employer]  Tobacco Use   Smoking status: Former    Current packs/day: 0.00    Average packs/day: 3.0 packs/day for 38.0 years (114.0 ttl pk-yrs)    Types: Cigarettes    Start date: 03/26/1966    Quit date: 03/25/2004    Years since quitting: 18.4   Smokeless tobacco: Former    Types: Chew    Quit date: 04/25/2004   Tobacco comments:    Former smoker 03/26/22  Vaping Use   Vaping status: Never Used  Substance and Sexual Activity   Alcohol use: Not Currently    Comment:  "quit drinking in 1989"   Drug use: No   Sexual activity: Not Currently  Other Topics Concern   Not on file  Social History Narrative   Resides in [place] with his  wife   Two children and two grandchildren   Attends [employer]   Social Determinants of Health   Financial Resource Strain: Not on file  Food Insecurity: Low Risk  (06/22/2022)   [facility]   Food vital sign    Within the past 12 months, you worried that your food would run out before you got money to buy more: Never true    Within the past 12 months, the food you bought just didn't last and you didn't have money to get more. : Never true  Transportation Needs: No Transportation Needs (06/22/2022)   [facility]   Transportation    In the past 12 months, has lack of reliable transportation kept you from medical appointments, meetings, work or from getting things needed for daily living? : No  Physical Activity: Not on file  Stress: Not on file  Social Connections: Not on file     Family History: The patient's family history includes Diabetes in his father; Emphysema in his maternal grandfather and mother; Heart attack in his father; Heart disease in his father and paternal grandfather; Hypertension in his father. There is no history of Colon cancer, Esophageal cancer, Inflammatory bowel disease, Liver disease, Pancreatic cancer, Rectal cancer, or Stomach cancer. ROS:   Please see the history of present illness.    All 14 point review of systems negative except as described per history of present illness.  EKGs/Labs/Other Studies Reviewed:    The following studies were reviewed today:   EKG:  EKG Interpretation Date/Time:  Wednesday September 15 2022 10:14:15 EDT Ventricular Rate:  100 PR Interval:    QRS Duration:  94 QT Interval:  358 QTC Calculation: 461 R Axis:   269  Text Interpretation: Atrial fibrillation Right superior axis deviation Nonspecific ST and T wave abnormality Abnormal ECG When compared with ECG of 23-Aug-2022 09:48, Atrial fibrillation has replaced Junctional rhythm Criteria for  Septal infarct are no longer Present T wave inversion no longer evident in Lateral leads Confirmed by Huntley Dec reddy [phone]) on 09/15/2022 10:42:48 AM    Recent Labs: 04/09/2022: Hemoglobin 13.0; Platelets 390 06/11/2022: ALT 16; BUN 12; Creatinine, Ser 1.20; Magnesium 1.6; Potassium 4.0; Sodium 136; TSH 1.955  Recent Lipid Panel    Component Value Date/Time   CHOL 113 02/20/2019 2042   TRIG 276 (H) 02/20/2019 2042   HDL 20 (L) 02/20/2019 2042   CHOLHDL 5.7 02/20/2019 2042   VLDL 55 (H) 02/20/2019 2042   LDLCALC 38 02/20/2019 2042    Cardiac CT done 07/17/2020 normal coronary origin, significantly elevated calcium score in RCA and LCx distribution [Limited assessment of LAD calcium due to prior stent]. Mid LCx 70 to 99% lesion versus blooming artifact.  Stress test recommended. Mid LAD patent stent Calcified RCA plaque with mild stenosis Mildly dilated ascending aorta 4.3 cm size.  CTA chest done on the same day 07-17-2020 noted Thoracic aorta aneurysm size up to 4.5 cm.  Main pulmonary artery trunk measuring 3.6 cm  Stress test with nuclear imaging completed November 21, 2020 Low risk study, no evidence of ischemia.  LVEF reported 63%.  Transthoracic echocardiogram 06/13/2020 LVEF 60 to 65%, normal RV function, mildly dilated right and left atrium, ascending aorta measured 4.6 cm, aortic root measured 4.6 cm   Physical Exam:    VS:  BP 118/78 (BP Location: Left Arm, Patient Position: Sitting)  Pulse 100   Ht 6' (1.829 m)   Wt 216 lb 12.8 oz (98.3 kg)   SpO2 93%   BMI 29.40 kg/m     Wt Readings from Last 3 Encounters:  09/15/22 216 lb 12.8 oz (98.3 kg)  08/23/22 209 lb (94.8 kg)  08/05/22 212 lb (96.2 kg)     GENERAL:  Well nourished, well developed in no acute distress on 2 L/min nasal flow oxygen while ambulating.  NECK: No JVD; No carotid bruits CARDIAC: RRR, S1 and S2 present, no murmurs, no rubs, no gallops CHEST:  Clear to auscultation without rales,  wheezing or rhonchi   Extremities:  1+ bilateral pitting pedal edema Pulses bilaterally symmetric with radial 2+ and dorsalis pedis 2+ NEUROLOGIC:  Alert and oriented x 3   ASSESSMENT AND PLAN:    Problem List Items Addressed This Visit     Hyperlipidemia    Last lipid panel to review from January 2021. Unable to tolerate statins. Will have him come back for follow-up visit in 3 months and obtain lipid panel prior to that had not reviewed options for lipid-lowering therapy based on the results.      Relevant Medications   Metoprolol Tartrate 75 MG TABS   Essential hypertension    Well-controlled on current regimen with losartan 12.5 mg once daily and metoprolol 50 mg twice daily. Metoprolol dose being titrated up for A-fib rate control.      Relevant Medications   Metoprolol Tartrate 75 MG TABS   CAD  s/p PCI of Mid LAD 08/2004, Cath 06/2009 unchagned, recent CTA 2022 with severe LCX disease vs artifact; followed by Stress nuclear test, no ischemia 10/2020    CAD s/p PCI of Mid LAD 08/2004, Cath 06/2009 unchagned, recent CTA 2022 with severe LCX disease vs artifact; followed by Stress nuclear test, no ischemia 10/2020  Fluid status limited due to his poor pulmonary status, dyspnea on exertion and chronic back pain.      Relevant Medications   Metoprolol Tartrate 75 MG TABS   Atrial fibrillation (HCC)    CHADS2 Vascor of 4. Rhythm control attempts with Tikosyn unsuccessful in May 2024 and amiodarone unsuccessful due to medication side effects. Currently remains in A-fib with rates up to 100 bpm on EKG and vitals check today.  Poor candidate for further rhythm control options given his underlying pulmonary status. Will continue rate control optimization by titrating up metoprolol dose to 75 mg twice daily. Advised to continue following up with Dr. Elberta Fortis for further discussion in this regard.  For stroke prophylaxis he is currently on Eliquis and tolerating it well.        Relevant Medications   Metoprolol Tartrate 75 MG TABS   Other Relevant Orders   EKG 12-Lead (Completed)   Thoracic aortic aneurysm (HCC)    Measuring up to 4.6 cm ascending aorta on CT imaging from 2022. Stable on recent repeat CT chest imaging in 2024 measuring about 4.4 cm in size.       Relevant Medications   Metoprolol Tartrate 75 MG TABS   Persistent atrial fibrillation (HCC) - Primary   Relevant Medications   Metoprolol Tartrate 75 MG TABS   Other Relevant Orders   EKG 12-Lead (Completed)     Medication Adjustments/Labs and Tests Ordered: Current medicines are reviewed at length with the patient today.  Concerns regarding medicines are outlined above.  Orders Placed This Encounter  Procedures   EKG 12-Lead   Meds ordered this encounter  Medications  Metoprolol Tartrate 75 MG TABS    Sig: Take 1 tablet (75 mg total) by mouth 2 (two) times daily.    Dispense:  180 tablet    Refill:  3    Signed, Jelisa Latta reddy Keinan Brouillet, MD, MPH, [facility]. 09/15/2022 11:30 AM     [facility]

## 2022-09-15 NOTE — Assessment & Plan Note (Signed)
Last lipid panel to review from January 2021. Unable to tolerate statins. Will have him come back for follow-up visit in 3 months and obtain lipid panel prior to that had not reviewed options for lipid-lowering therapy based on the results.

## 2022-09-15 NOTE — Patient Instructions (Signed)
Medication Instructions:  Your physician has recommended you make the following change in your medication:   START: Metoprolol tartrate 75 mg twice daily  *If you need a refill on your cardiac medications before your next appointment, please call your pharmacy*   Lab Work: None If you have labs (blood work) drawn today and your tests are completely normal, you will receive your results only by: MyChart Message (if you have MyChart) OR A paper copy in the mail If you have any lab test that is abnormal or we need to change your treatment, we will call you to review the results.   Testing/Procedures: None   Follow-Up: At [facility], you and your health needs are our priority.  As part of our continuing mission to provide you with exceptional heart care, we have created designated Provider Care Teams.  These Care Teams include your primary Cardiologist (physician) and Advanced Practice Providers (APPs -  Physician Assistants and Nurse Practitioners) who all work together to provide you with the care you need, when you need it.  We recommend signing up for the patient portal called "MyChart".  Sign up information is provided on this After Visit Summary.  MyChart is used to connect with patients for Virtual Visits (Telemedicine).  Patients are able to view lab/test results, encounter notes, upcoming appointments, etc.  Non-urgent messages can be sent to your provider as well.   To learn more about what you can do with MyChart, go to [email or web address].    Your next appointment:   3 month(s)  Provider:   Dr. Vincent Gros  Other Instructions None

## 2022-09-15 NOTE — Assessment & Plan Note (Signed)
CHADS2 Vascor of 4. Rhythm control attempts with Tikosyn unsuccessful in May 2024 and amiodarone unsuccessful due to medication side effects. Currently remains in A-fib with rates up to 100 bpm on EKG and vitals check today.  Poor candidate for further rhythm control options given his underlying pulmonary status. Will continue rate control optimization by titrating up metoprolol dose to 75 mg twice daily. Advised to continue following up with Dr. Elberta Fortis for further discussion in this regard.  For stroke prophylaxis he is currently on Eliquis and tolerating it well.

## 2022-09-15 NOTE — Assessment & Plan Note (Signed)
Measuring up to 4.6 cm ascending aorta on CT imaging from 2022. Stable on recent repeat CT chest imaging in 2024 measuring about 4.4 cm in size.

## 2022-09-15 NOTE — Assessment & Plan Note (Signed)
Well-controlled on current regimen with losartan 12.5 mg once daily and metoprolol 50 mg twice daily. Metoprolol dose being titrated up for A-fib rate control.

## 2022-09-15 NOTE — Assessment & Plan Note (Signed)
CAD s/p PCI of Mid LAD 08/2004, Cath 06/2009 unchagned, recent CTA 2022 with severe LCX disease vs artifact; followed by Stress nuclear test, no ischemia 10/2020  Fluid status limited due to his poor pulmonary status, dyspnea on exertion and chronic back pain.

## 2022-09-15 NOTE — Assessment & Plan Note (Signed)
>>  ASSESSMENT AND PLAN FOR ATRIAL FIBRILLATION (HCC) WRITTEN ON 09/15/2022 11:27 AM BY Jonerik Sliker, Marlyn Corporal, MD  CHADS2 Vascor of 4. Rhythm control attempts with Tikosyn unsuccessful in May 2024 and amiodarone unsuccessful due to medication side effects. Currently remains in A-fib with rates up to 100 bpm on EKG and vitals check today.  Poor candidate for further rhythm control options given his underlying pulmonary status. Will continue rate control optimization by titrating up metoprolol dose to 75 mg twice daily. Advised to continue following up with Dr. Elberta Fortis for further discussion in this regard.  For stroke prophylaxis he is currently on Eliquis and tolerating it well.

## 2022-09-16 ENCOUNTER — Telehealth: Payer: Self-pay

## 2022-09-16 DIAGNOSIS — I4819 Other persistent atrial fibrillation: Secondary | ICD-10-CM

## 2022-09-16 NOTE — Telephone Encounter (Signed)
Pt c/o medication issue:  1. Name of Medication:   Metoprolol Tartrate 75 MG TABS   2. How are you currently taking this medication (dosage and times per day)?   As prescribed  3. Are you having a reaction (difficulty breathing--STAT)?   Felt faint like he was about to pass out  4. What is your medication issue?    Patient felt faint as though he was going to pass out.  Patient stated his medication was increased to 75 mg and wants to know if his dosage should be reduced back to 50 mg.

## 2022-09-16 NOTE — Telephone Encounter (Signed)
Spoke with patient of Dr. Vincent Gros. He reports a 2 episodes while he was sitting in the car this morning (as a passenger) when he felt faint like he would pass out. He has no vitals from this episode. He feels "good right now". He does not feel like he was dehydrated this morning. His blood sugar this morning was 138 and he took 25U insulin and drank 6 oz water this morning and about a half of a soda. He had eaten a raisin cake this morning before the episode.   Daughter (nurse) checked his pulse manually and told him he was in afib -- known persistent afib. He said it was fast.   Upon waking today, HR was 54. BP @ 3am - 90/70 -- he checked BP at this time b/c his heart woke him up (skipping beats), he did not feel well.   Explained that his episode could be due to varying HRs with AFib or blood sugars. Since he is asymptomatic at present, advised would send an update to MD to review and he would be contacted with any recommendations.

## 2022-09-17 ENCOUNTER — Ambulatory Visit: Payer: Medicare HMO

## 2022-09-17 DIAGNOSIS — I4819 Other persistent atrial fibrillation: Secondary | ICD-10-CM | POA: Diagnosis not present

## 2022-09-17 NOTE — Telephone Encounter (Signed)
Spoke with pt, he will come by the office today and pick up the monitor. Follow up appointment will be scheduled when he is in the office.

## 2022-09-17 NOTE — Telephone Encounter (Signed)
Madireddy, Steve Corporal, MD  You; Eleonore Chiquito, RN1 minute ago (8:28 AM)    Thank you for the note. Unclear if his symptoms are related to blood sugars are underlying rhythm. poorly controlled heart rates while in the clinic prompted titrating up metoprolol dose.  Lets monitor his heart rates over a week with Zio patch. Schedule him for a follow-up visit in the office in about a month. Thank you

## 2022-09-29 ENCOUNTER — Telehealth: Payer: Self-pay

## 2022-09-29 MED ORDER — METOPROLOL TARTRATE 75 MG PO TABS: 75.0000 mg | ORAL_TABLET | Freq: Three times a day (TID) | ORAL | 3 refills | Status: DC

## 2022-09-29 MED ORDER — METOPROLOL TARTRATE 50 MG PO TABS: 75.0000 mg | ORAL_TABLET | Freq: Three times a day (TID) | ORAL | 12 refills | Status: DC

## 2022-09-29 NOTE — Telephone Encounter (Signed)
Pt c/o medication issue:  1. Name of Medication:   Metoprolol Tartrate 75 MG TABS    2. How are you currently taking this medication (dosage and times per day)?   3. Are you having a reaction (difficulty breathing--STAT)?   4. What is your medication issue? Pharmacy is requesting call back to go over medication and dosing. Please advise.

## 2022-09-29 NOTE — Telephone Encounter (Signed)
RX for 50 mg tablet to take 1.5 three times daily. Zoo Bruce aware.

## 2022-09-29 NOTE — Addendum Note (Signed)
Addended by: Eleonore Chiquito on: 09/29/2022 03:48 PM   Modules accepted: Orders

## 2022-09-29 NOTE — Telephone Encounter (Signed)
Follow Up:      Steve Anthony is calling with abnormal results.

## 2022-09-29 NOTE — Telephone Encounter (Signed)
Spoke with Zollie Scale at [facility] who states that the pt had rapid Afib 183 bpm for 60 sec on 09/22/22 at 5:48 pm.

## 2022-09-29 NOTE — Telephone Encounter (Signed)
Return call to pharmacy. Staff states another nurse had already addressed their questions.

## 2022-09-29 NOTE — Telephone Encounter (Signed)
 Results reviewed with pt as per Dr. Madireddy's note.  Pt verbalized understanding and had no additional questions. Routed to PCP

## 2022-10-08 DIAGNOSIS — J432 Centrilobular emphysema: Secondary | ICD-10-CM | POA: Insufficient documentation

## 2022-10-08 HISTORY — DX: Centrilobular emphysema: J43.2

## 2022-10-15 DIAGNOSIS — K859 Acute pancreatitis without necrosis or infection, unspecified: Secondary | ICD-10-CM | POA: Insufficient documentation

## 2022-10-15 DIAGNOSIS — G4733 Obstructive sleep apnea (adult) (pediatric): Secondary | ICD-10-CM | POA: Insufficient documentation

## 2022-10-15 DIAGNOSIS — E0590 Thyrotoxicosis, unspecified without thyrotoxic crisis or storm: Secondary | ICD-10-CM | POA: Insufficient documentation

## 2022-10-15 DIAGNOSIS — C439 Malignant melanoma of skin, unspecified: Secondary | ICD-10-CM | POA: Insufficient documentation

## 2022-10-15 DIAGNOSIS — J45909 Unspecified asthma, uncomplicated: Secondary | ICD-10-CM | POA: Insufficient documentation

## 2022-10-15 DIAGNOSIS — K8020 Calculus of gallbladder without cholecystitis without obstruction: Secondary | ICD-10-CM | POA: Insufficient documentation

## 2022-10-15 DIAGNOSIS — E119 Type 2 diabetes mellitus without complications: Secondary | ICD-10-CM | POA: Insufficient documentation

## 2022-10-15 DIAGNOSIS — Z87442 Personal history of urinary calculi: Secondary | ICD-10-CM | POA: Insufficient documentation

## 2022-10-15 DIAGNOSIS — I10 Essential (primary) hypertension: Secondary | ICD-10-CM | POA: Insufficient documentation

## 2022-10-15 DIAGNOSIS — I480 Paroxysmal atrial fibrillation: Secondary | ICD-10-CM | POA: Insufficient documentation

## 2022-10-15 DIAGNOSIS — Z9981 Dependence on supplemental oxygen: Secondary | ICD-10-CM | POA: Insufficient documentation

## 2022-10-15 DIAGNOSIS — I471 Supraventricular tachycardia, unspecified: Secondary | ICD-10-CM | POA: Insufficient documentation

## 2022-10-15 DIAGNOSIS — M1990 Unspecified osteoarthritis, unspecified site: Secondary | ICD-10-CM | POA: Insufficient documentation

## 2022-10-15 DIAGNOSIS — K5792 Diverticulitis of intestine, part unspecified, without perforation or abscess without bleeding: Secondary | ICD-10-CM | POA: Insufficient documentation

## 2022-10-15 DIAGNOSIS — J189 Pneumonia, unspecified organism: Secondary | ICD-10-CM | POA: Insufficient documentation

## 2022-10-15 DIAGNOSIS — J449 Chronic obstructive pulmonary disease, unspecified: Secondary | ICD-10-CM | POA: Insufficient documentation

## 2022-10-15 DIAGNOSIS — Z452 Encounter for adjustment and management of vascular access device: Secondary | ICD-10-CM | POA: Insufficient documentation

## 2022-10-15 DIAGNOSIS — D649 Anemia, unspecified: Secondary | ICD-10-CM | POA: Insufficient documentation

## 2022-10-15 DIAGNOSIS — U071 COVID-19: Secondary | ICD-10-CM | POA: Insufficient documentation

## 2022-10-18 ENCOUNTER — Telehealth: Payer: Self-pay | Admitting: Gastroenterology

## 2022-10-18 ENCOUNTER — Ambulatory Visit: Payer: Medicare HMO

## 2022-10-18 VITALS — BP 116/80 | HR 125 | Ht 72.0 in | Wt 214.0 lb

## 2022-10-18 DIAGNOSIS — I4819 Other persistent atrial fibrillation: Secondary | ICD-10-CM

## 2022-10-18 DIAGNOSIS — E782 Mixed hyperlipidemia: Secondary | ICD-10-CM | POA: Diagnosis not present

## 2022-10-18 DIAGNOSIS — I480 Paroxysmal atrial fibrillation: Secondary | ICD-10-CM

## 2022-10-18 DIAGNOSIS — I2510 Atherosclerotic heart disease of native coronary artery without angina pectoris: Secondary | ICD-10-CM

## 2022-10-18 MED ORDER — METOPROLOL TARTRATE 100 MG PO TABS
100.0000 mg | ORAL_TABLET | Freq: Three times a day (TID) | ORAL | 3 refills | Status: DC
Start: 2022-10-18 — End: 2022-11-23

## 2022-10-18 MED ORDER — PANTOPRAZOLE SODIUM 40 MG PO TBEC: 40.0000 mg | DELAYED_RELEASE_TABLET | Freq: Two times a day (BID) | ORAL | 0 refills | Status: DC

## 2022-10-18 NOTE — Assessment & Plan Note (Signed)
Remains persistently elevated with heart rates. Titrate up metoprolol tartrate to 100 mg 3 times daily.  Will schedule for follow-up with Dr. Elberta Fortis in EP to review further options.    Him and his wife and send he has very limited options for rhythm control given past unsuccessful attempts with Tikosyn, underlying COPD.  Hopefully can follow-up with Dr. Elberta Fortis with regards to redo ablation [once again pulmonary status would be a limiting factor], cardioversion with alternate antiarrhythmic versus AV nodal ablation and permanent pacemaker placement.  Will obtain a transthoracic echocardiogram to assess interval change in his cardiac function.

## 2022-10-18 NOTE — Progress Notes (Signed)
Cardiology Office Note:    Date:  10/18/2022   ID:  Steve Anthony, DOB 02-Mar-1950, MRN [id number]  PCP:  Gordan Payment., MD  Cardiologist:  Luretha Murphy, MD    Referring MD: Gordan Payment., MD   No chief complaint on file. Follow-up of atrial fibrillation  History of Present Illness:    Steve Anthony is a 72 y.o. male here for follow-up.  Last visit was September 15, 2022. Here for the visit accompanied by his wife.  He has history of coronary artery disease, s/p PCI of mid LAD in 2006, last cath in 2011 unchanged, CTA coronary angiogram 2022 with severe LCx disease versus artifact functional assessment with stress test nuclear imaging showed no ischemia in October 2022, persistent atrial fibrillation failed rhythm control with Tikosyn in May 2024 and amiodarone associated with side effects, on anticoagulation with Eliquis, stable thoracic aortic aneurysm measuring 4.4 cm on recent chest imaging, hypertension, hyperlipidemia unable to take statins due to intolerance, COPD.  At last visit metoprolol dose was increased to 75 mg twice daily for rate control optimization.  Event monitor September 17, 2022 noted average heart rate 114/min.  EKG in the clinic today shows atrial fibrillation heart rate 125/min.  He mentions his breathing has been labored and he has very limited functional capacity.  Continues to use oxygen by nasal cannula.  Feels extremely poor with energy levels.  Mentions he has establish care with pulmonologist in the class for his COPD to be stage III.  Has bilateral lower extremity pedal and pedal edema extending slightly above the ankles.  Mentions good compliance with medications.  At times forgets afternoon doses of medications.  The past week couple days he might of forgotten to take his metoprolol tartrate.  Even today he has not taken his metoprolol tartrate afternoon dose.  Lipid panel from 09-30-2022 notes total cholesterol 102, triglycerides 63, HDL 39, LDL  49, non-HDL 63 CMP notes BUN 18, creatinine 1.57, EGFR 47, potassium 5.2, sodium 139 CBC with hemoglobin 13, had a crit 38.8, WBC 6.4, platelets 226   Past Medical History:  Diagnosis Date   Abnormal CT of the abdomen 05/27/2018   Abnormal MRI of abdomen 04/14/2018   Acute pancreatitis 02/20/2019   Anemia    Anxiety disorder 04/09/2015   Arthritis    Asthma    Atrial fibrillation (HCC) 11/21/2008   Qualifier: Diagnosis of   By: Ladona Ridgel, MD, Jerrell Mylar        CAD (coronary artery disease)    a. S/P PCI mid LAD (vision stent) 08/31/2004;  b. repeat cath 6/11 - no progression of CAD. c. Cath 10/2012 given moderate CAD on cardiac CT - no change from prior cath.   Centrilobular emphysema (HCC) 10/08/2022   Chest CT 08/04/2022     Chest pain 12/16/2016   CHEST PAIN UNSPECIFIED 11/21/2008   Qualifier: Diagnosis of   By: Remer Macho, EMT-P, Tara         Chronic respiratory failure with hypoxia (HCC) 02/24/2017   Cirrhosis of liver without ascites (HCC) 04/14/2018   CKD (chronic kidney disease), stage III (HCC) 02/20/2019   COPD (chronic obstructive pulmonary disease) (HCC)    COPD without exacerbation (HCC) 05/15/2009   Qualifier: Diagnosis of   By: Vassie Loll MD, Comer Locket.         COVID-19 virus infection    Cyst of nipple, right 11/03/2020   Degeneration of lumbar intervertebral disc 04/09/2015   Diverticulitis    Dyslipidemia  Essential hypertension 10/15/2008   Qualifier: Diagnosis of   By: Flonnie Overman         Gallstones    GERD 10/15/2008   Qualifier: Diagnosis of   By: Flonnie Overman         High risk medication use 04/09/2015   History of COVID-19 01/24/19 02/20/2019   History of kidney stones    History of pelvic fracture 01/01/2020   History of tattoo 08/23/2016   History of UTI 11/28/2019   Hypercoagulable state due to persistent atrial fibrillation (HCC) 03/26/2022   Hyperlipidemia 10/15/2008   Qualifier: Diagnosis of   By: Flonnie Overman         Hypertension     Hyperthyroidism    HYPERTRIGLYCERIDEMIA 10/15/2008   Qualifier: Diagnosis of   By: Flonnie Overman         Hypotension 06/13/2019   Idiopathic acute pancreatitis 04/14/2018   Incisional hernia, without obstruction or gangrene 02/05/2016   Infected pancreatic pseudocyst 06/07/2019   Insomnia 04/09/2015   Iron deficiency anemia 05/03/2022   Kidney mass 07/06/2017   Following with urology. Chao.  Recommend follow up. Last seen 01/2018  Refuses to see again.   CT okay 04/2021     Long term (current) use of anticoagulants [Z79.01] 06/10/2015   Melanoma (HCC)    melanoma removed from neck    Mild episode of recurrent major depressive disorder (HCC) 08/27/2015   NEPHROLITHIASIS 10/15/2008   Qualifier: Diagnosis of   By: Flonnie Overman         OA (osteoarthritis) of hip 12/18/2014   On home oxygen therapy    "2L q hs" (11/08/2012),  06/04/19 pt. not on oxygen   OSA (obstructive sleep apnea)    a. cpap noncompliance- patient reports on 12/10/14- does not use CPAP   Palpitations    a. 10/2012 - s/p LINQ loop recorder to assess for arrhythmia.    Pancreatic cyst 04/14/2018   Pancreatic pseudocyst 06/07/2019   Pancreatitis    Paroxysmal A-fib (HCC) 11/21/2008   a. S/P PVI 2/11 and 9/11   Pelvic fracture (HCC) 12/31/2019   Persistent atrial fibrillation (HCC) 06/08/2022   PICC (peripherally inserted central catheter) in place    Pneumonia    Protein-calorie malnutrition, severe 06/12/2019   Pseudocyst of pancreas 03/06/2019   Pure hypercholesterolemia    S/P coronary artery stent placement    SHORTNESS OF BREATH 03/19/2009   Qualifier: Diagnosis of   By: Johney Frame MD, James         Sleep apnea 05/17/2009   Qualifier: Diagnosis of   By: Vassie Loll MD, Comer Locket.         SVT (supraventricular tachycardia)    a. Atypical AVNRT of the slow pathway   Thoracic aortic aneurysm (HCC) 11/13/2020   Type II diabetes mellitus (HCC)    Unstable angina (HCC) 10/15/2008   Qualifier: Diagnosis of   By: Flonnie Overman          Past Surgical History:  Procedure Laterality Date   ATRIAL ABLATION SURGERY  2007   S/P slow pathway ablation for atypical AVNRT    BIOPSY  07/17/2018   Procedure: BIOPSY;  Surgeon: Lemar Lofty., MD;  Location: [facility] ENDOSCOPY;  Service: Gastroenterology;;   CARDIOVERSION N/A 04/13/2022   Procedure: CARDIOVERSION;  Surgeon: Chrystie Nose, MD;  Location: [facility] ENDOSCOPY;  Service: Cardiovascular;  Laterality: N/A;   CARDIOVERSION N/A 06/10/2022   Procedure: CARDIOVERSION;  Surgeon: Wendall Stade, MD;  Location: [facility] INVASIVE CV LAB;  Service: Cardiovascular;  Laterality: N/A;   CHOLECYSTECTOMY  ~ 2008   COLON RESECTION  02/2013    COLONOSCOPY  12/25/2007   Colonic polyp status post polypectomy. Internal hemorrhoids.    CORONARY ANGIOPLASTY WITH STENT PLACEMENT  08/30/2004   Vision; to LAD    CYSTOSCOPY W/ STONE MANIPULATION  ~ 1998   "once" (11/08/2012)   EP Study  2008   Negative EP study in [place]   ESOPHAGOGASTRODUODENOSCOPY (EGD) WITH PROPOFOL N/A 07/17/2018   Procedure: ESOPHAGOGASTRODUODENOSCOPY (EGD) WITH PROPOFOL;  Surgeon: Lemar Lofty., MD;  Location: [facility] ENDOSCOPY;  Service: Gastroenterology;  Laterality: N/A;   FRACTURE SURGERY     LEFT HEART CATHETERIZATION WITH CORONARY ANGIOGRAM N/A 11/10/2012   Procedure: LEFT HEART CATHETERIZATION WITH CORONARY ANGIOGRAM;  Surgeon: Kathleene Hazel, MD;  Location: [facility] CATH LAB;  Service: Cardiovascular;  Laterality: N/A;   LOOP RECORDER IMPLANT N/A 11/10/2012   Medtronic LinQ implanted by Dr Johney Frame for afib management   LUMBAR DISC SURGERY  2000; 2002   POSTERIOR LUMBAR FUSION  2004   PROSTATE SURGERY  ~ 2009   PVI/ CTI ablation  02/25/2009 and 09/2009   S/P afib/ CTI ablation   SPLENECTOMY, TOTAL N/A 06/07/2019   Procedure: SPLENECTOMY;  Surgeon: Almond Lint, MD;  Location: [facility] OR;  Service: General;  Laterality: N/A;   TIBIA FRACTURE SURGERY Right 1990   "broke in 3 places; had rod put in"  (11/08/2012)   TIBIA HARDWARE REMOVAL Right ~ 1991   TONSILLECTOMY  1965   TOTAL HIP ARTHROPLASTY Right 12/18/2014   Procedure: RIGHT TOTAL HIP ARTHROPLASTY ANTERIOR APPROACH;  Surgeon: Ollen Gross, MD;  Location: [facility] ORS;  Service: Orthopedics;  Laterality: Right;   UPPER ESOPHAGEAL ENDOSCOPIC ULTRASOUND (EUS) N/A 07/17/2018   Procedure: UPPER ESOPHAGEAL ENDOSCOPIC ULTRASOUND (EUS);  Surgeon: Lemar Lofty., MD;  Location: [facility] ENDOSCOPY;  Service: Gastroenterology;  Laterality: N/A;    Current Medications: Current Meds  Medication Sig   acetaminophen (TYLENOL) 500 MG tablet Take 1,000 mg by mouth every 6 (six) hours as needed for mild pain, fever or headache.   albuterol (VENTOLIN HFA) 108 (90 Base) MCG/ACT inhaler Inhale 1-2 puffs into the lungs as needed for wheezing or shortness of breath.   apixaban (ELIQUIS) 5 MG TABS tablet Take 1 tablet by mouth twice daily   baclofen (LIORESAL) 10 MG tablet Take 10 mg by mouth at bedtime.   BREZTRI AEROSPHERE 160-9-4.8 MCG/ACT AERO Inhale 1 puff into the lungs 2 (two) times daily.   Cyanocobalamin (VITAMIN B-12 IJ) Inject 1,000 mcg as directed every 30 (thirty) days.   docusate sodium (COLACE) 100 MG capsule Take 1 capsule (100 mg total) by mouth 2 (two) times daily as needed for mild constipation.   escitalopram (LEXAPRO) 10 MG tablet Take 1 tablet by mouth daily.   fenofibrate 160 MG tablet Take 160 mg by mouth daily.   ferrous sulfate 325 (65 FE) MG tablet Take 325 mg by mouth daily with breakfast.   furosemide (LASIX) 20 MG tablet Take 40 mg by mouth daily as needed for fluid or edema.   gabapentin (NEURONTIN) 300 MG capsule Take 1 capsule (300 mg total) by mouth 3 (three) times daily.   Insulin Glargine (BASAGLAR KWIKPEN) 100 UNIT/ML Inject 35 Units into the skin in the morning.   Insulin Pen Needle 29G X MISC 1 Device by Does not apply route 3 (three) times daily as needed (for use with insulin pen).   JARDIANCE 25 MG TABS tablet  Take  25 mg by mouth daily.   losartan (COZAAR) 25 MG tablet Take 1/2 (one-half) tablet by mouth once daily   magnesium oxide (MAG-OX) 400 MG tablet Take 1 tablet (400 mg total) by mouth daily.   Melatonin 10 MG TABS Take 20 mg by mouth at bedtime.   metFORMIN (GLUCOPHAGE) 1000 MG tablet Take 1,000 mg by mouth 2 (two) times daily.   Multiple Vitamins-Minerals (PRESERVISION AREDS) TABS Take 1 tablet by mouth daily.   nitroGLYCERIN (NITROSTAT) 0.4 MG SL tablet Place 0.4 mg under the tongue every 5 (five) minutes as needed for chest pain.   Oxycodone HCl 10 MG TABS Take 1 tablet by mouth daily as needed (For pain). Take 1 tablet every 4-6 hours as needed   oxyCODONE-acetaminophen (PERCOCET) 10-325 MG tablet Take 1 tablet by mouth every 6 (six) hours as needed for pain.   OXYGEN Inhale 2 L into the lungs continuous.   pantoprazole (PROTONIX) 40 MG tablet Take 1 tablet (40 mg total) by mouth 2 (two) times daily.   Tamsulosin HCl (FLOMAX) 0.4 MG CAPS Take 0.4 mg by mouth daily.   zolpidem (AMBIEN) 5 MG tablet Take 0.5 tablets (2.5 mg total) by mouth at bedtime as needed for sleep.   [DISCONTINUED] metoprolol tartrate (LOPRESSOR) 50 MG tablet Take 1.5 tablets (75 mg total) by mouth in the morning, at noon, and at bedtime.     Allergies:   Penicillins   Social History   Socioeconomic History   Marital status: Married    Spouse name: Not on file   Number of children: 1   Years of education: Not on file   Highest education level: Not on file  Occupational History   Occupation: DISABLED    Employer: DISABLED   Occupation: Retired [employer]  Tobacco Use   Smoking status: Former    Current packs/day: 0.00    Average packs/day: 3.0 packs/day for 38.0 years (114.0 ttl pk-yrs)    Types: Cigarettes    Start date: 03/26/1966    Quit date: 03/25/2004    Years since quitting: 18.5   Smokeless tobacco: Former    Types: Chew    Quit date: 04/25/2004   Tobacco comments:    Former smoker 03/26/22   Vaping Use   Vaping status: Never Used  Substance and Sexual Activity   Alcohol use: Not Currently    Comment:  "quit drinking in 1989"   Drug use: No   Sexual activity: Not Currently  Other Topics Concern   Not on file  Social History Narrative   Resides in [place] with his wife   Two children and two grandchildren   Attends [employer]   Social Determinants of Health   Financial Resource Strain: Not on file  Food Insecurity: Low Risk  (10/08/2022)   Received from [facility]   Hunger Vital Sign    Worried About Running Out of Food in the Last Year: Never true    Ran Out of Food in the Last Year: Never true  Transportation Needs: No Transportation Needs (10/08/2022)   Received from [facility]    In the past 12 months, has lack of reliable transportation kept you from medical appointments, meetings, work or from getting things needed for daily living? : No  Physical Activity: Not on file  Stress: Not on file  Social Connections: Not on file     Family History: The patient's family history includes Diabetes in his father; Emphysema in his  maternal grandfather and mother; Heart attack in his father; Heart disease in his father and paternal grandfather; Hypertension in his father. There is no history of Colon cancer, Esophageal cancer, Inflammatory bowel disease, Liver disease, Pancreatic cancer, Rectal cancer, or Stomach cancer. ROS:   Please see the history of present illness.    All 14 point review of systems negative except as described per history of present illness  EKGs/Labs/Other Studies Reviewed:    EKG Interpretation Date/Time:  Monday October 18 2022 12:51:33 EDT Ventricular Rate:  125 PR Interval:    QRS Duration:  92 QT Interval:  258 QTC Calculation: 372 R Axis:   167  Text Interpretation: Atrial fibrillation with rapid ventricular response Right axis deviation Septal infarct , age undetermined Abnormal ECG When  compared with ECG of 15-Sep-2022 10:14, Questionable change in QRS axis Confirmed by Huntley Dec reddy [phone]) on 10/18/2022 12:57:17 PM    Recent Labs: 04/09/2022: Hemoglobin 13.0; Platelets 390 06/11/2022: ALT 16; BUN 12; Creatinine, Ser 1.20; Magnesium 1.6; Potassium 4.0; Sodium 136; TSH 1.955  Recent Lipid Panel    Component Value Date/Time   CHOL 113 02/20/2019 2042   TRIG 276 (H) 02/20/2019 2042   HDL 20 (L) 02/20/2019 2042   CHOLHDL 5.7 02/20/2019 2042   VLDL 55 (H) 02/20/2019 2042   LDLCALC 38 02/20/2019 2042    Physical Exam:    VS:  BP 116/80   Pulse (!) 125   Ht 6' (1.829 m)   Wt 214 lb (97.1 kg)   SpO2 (!) 88%   BMI 29.02 kg/m     Wt Readings from Last 3 Encounters:  10/18/22 214 lb (97.1 kg)  09/15/22 216 lb 12.8 oz (98.3 kg)  08/23/22 209 lb (94.8 kg)     GENERAL: Not in distress. NECK: No JVD; No carotid bruits CARDIAC: Irregularly irregular, tachycardic, S1 and S2 present, no murmurs, no rubs, no gallops CHEST: Bilateral mild wheezing and crackles present. Extremities: 1+ pitting pedal edema extending slightly above the ankles.  Has bilateral varicose veins superficial on lower extremities.  Pulses bilaterally symmetric with radial 2+ and dorsalis pedis 2+ NEUROLOGIC:  Alert and oriented x 3   ASSESSMENT AND PLAN:   Mr. Servi 72 year old male patient with history of CAD prior PCI of mid LAD in 2006, persistent atrial fibrillation, COPD stage III, hyperlipidemia, hypertension appears to remain symptomatic with persistent atrial fibrillation and elevated heart rates and failed rhythm control attempts with Tikosyn, amiodarone resulted in side effects that subsided with discontinuation of the medication.  Problem List Items Addressed This Visit     Hyperlipidemia    Excellent controlled numbers from recent lipid panel manage 05-2022 with total cholesterol 102, triglycerides 63, HDL 39, LDL 49, non-HDL 63.  Continue with fenofibrate. At this time we  will hold off on adding Zetia or PCSK9 inhibitors given the good control.       Relevant Medications   metoprolol tartrate (LOPRESSOR) 100 MG tablet   CAD  s/p PCI of Mid LAD 08/2004, Cath 06/2009 unchagned, recent CTA 2022 with severe LCX disease vs artifact; followed by Stress nuclear test, no ischemia 10/2020 - Primary    Not on aspirin as he remains on Eliquis for A-fib. Not on statins given his reported symptoms and intolerance. Fenofibrate 160 mg daily, recent lipid panel with excellent control.      Relevant Medications   metoprolol tartrate (LOPRESSOR) 100 MG tablet   Other Relevant Orders   EKG 12-Lead (Completed)   Persistent A-fib  s/p ablation 2011; TEE/CV May 2024 with Tikosyn, unsuccessful; amiodarone with side effects was not tolerated well;    Remains persistently elevated with heart rates. Titrate up metoprolol tartrate to 100 mg 3 times daily.  Will schedule for follow-up with Dr. Elberta Fortis in EP to review further options.    Him and his wife and send he has very limited options for rhythm control given past unsuccessful attempts with Tikosyn, underlying COPD.  Hopefully can follow-up with Dr. Elberta Fortis with regards to redo ablation [once again pulmonary status would be a limiting factor], cardioversion with alternate antiarrhythmic versus AV nodal ablation and permanent pacemaker placement.  Will obtain a transthoracic echocardiogram to assess interval change in his cardiac function.        Relevant Medications   metoprolol tartrate (LOPRESSOR) 100 MG tablet   Other Relevant Orders   ECHOCARDIOGRAM COMPLETE   Return to clinic for follow-up with me in 6 months.  Medication Adjustments/Labs and Tests Ordered: Current medicines are reviewed at length with the patient today.  Concerns regarding medicines are outlined above.  Orders Placed This Encounter  Procedures   EKG 12-Lead   ECHOCARDIOGRAM COMPLETE   Medication changes:  Meds ordered this encounter   Medications   metoprolol tartrate (LOPRESSOR) 100 MG tablet    Sig: Take 1 tablet (100 mg total) by mouth in the morning, at noon, and at bedtime.    Dispense:  270 tablet    Refill:  3    Signed, Ioannis Schuh reddy Aribelle Mccosh, MD, MPH, [facility] 10/18/2022 1:33 PM    [facility]

## 2022-10-18 NOTE — Telephone Encounter (Signed)
Spoke with Steve Anthony to confirm pharmacy and I also set him up a yearly appointment with Dr Chales Abrahams. Refilled his pantoprazole to cover until his appointment.

## 2022-10-18 NOTE — Assessment & Plan Note (Signed)
Not on aspirin as he remains on Eliquis for A-fib. Not on statins given his reported symptoms and intolerance. Fenofibrate 160 mg daily, recent lipid panel with excellent control.

## 2022-10-18 NOTE — Assessment & Plan Note (Signed)
Excellent controlled numbers from recent lipid panel manage 05-2022 with total cholesterol 102, triglycerides 63, HDL 39, LDL 49, non-HDL 63.  Continue with fenofibrate. At this time we will hold off on adding Zetia or PCSK9 inhibitors given the good control.

## 2022-10-18 NOTE — Patient Instructions (Addendum)
Medication Instructions:  Your physician has recommended you make the following change in your medication:   Increase your Metoprolol to 100 mg 3 times daily.  *If you need a refill on your cardiac medications before your next appointment, please call your pharmacy*   Lab Work: None ordered If you have labs (blood work) drawn today and your tests are completely normal, you will receive your results only by: MyChart Message (if you have MyChart) OR A paper copy in the mail If you have any lab test that is abnormal or we need to change your treatment, we will call you to review the results.   Testing/Procedures: Your physician has requested that you have an echocardiogram. Echocardiography is a painless test that uses sound waves to create images of your heart. It provides your doctor with information about the size and shape of your heart and how well your heart's chambers and valves are working. This procedure takes approximately one hour. There are no restrictions for this procedure. Please do NOT wear cologne, perfume, aftershave, or lotions (deodorant is allowed). Please arrive 15 minutes prior to your appointment time.     Follow-Up: At [facility], you and your health needs are our priority.  As part of our continuing mission to provide you with exceptional heart care, we have created designated Provider Care Teams.  These Care Teams include your primary Cardiologist (physician) and Advanced Practice Providers (APPs -  Physician Assistants and Nurse Practitioners) who all work together to provide you with the care you need, when you need it.  We recommend signing up for the patient portal called "MyChart".  Sign up information is provided on this After Visit Summary.  MyChart is used to connect with patients for Virtual Visits (Telemedicine).  Patients are able to view lab/test results, encounter notes, upcoming appointments, etc.  Non-urgent messages can be sent to your provider  as well.   To learn more about what you can do with MyChart, go to [email or web address].    Your next appointment:   6 month(s)  The format for your next appointment:   In Person  Provider:   Belva Crome, MD   Other Instructions Echocardiogram An echocardiogram is a test that uses sound waves (ultrasound) to produce images of the heart. Images from an echocardiogram can provide important information about: Heart size and shape. The size and thickness and movement of your heart's walls. Heart muscle function and strength. Heart valve function or if you have stenosis. Stenosis is when the heart valves are too narrow. If blood is flowing backward through the heart valves (regurgitation). A tumor or infectious growth around the heart valves. Areas of heart muscle that are not working well because of poor blood flow or injury from a heart attack. Aneurysm detection. An aneurysm is a weak or damaged part of an artery wall. The wall bulges out from the normal force of blood pumping through the body. Tell a health care provider about: Any allergies you have. All medicines you are taking, including vitamins, herbs, eye drops, creams, and over-the-counter medicines. Any blood disorders you have. Any surgeries you have had. Any medical conditions you have. Whether you are pregnant or may be pregnant. What are the risks? Generally, this is a safe test. However, problems may occur, including an allergic reaction to dye (contrast) that may be used during the test. What happens before the test? No specific preparation is needed. You may eat and drink normally. What happens during the test? You will  take off your clothes from the waist up and put on a hospital gown. Electrodes or electrocardiogram (ECG)patches may be placed on your chest. The electrodes or patches are then connected to a device that monitors your heart rate and rhythm. You will lie down on a table for an ultrasound exam.  A gel will be applied to your chest to help sound waves pass through your skin. A handheld device, called a transducer, will be pressed against your chest and moved over your heart. The transducer produces sound waves that travel to your heart and bounce back (or "echo" back) to the transducer. These sound waves will be captured in real-time and changed into images of your heart that can be viewed on a video monitor. The images will be recorded on a computer and reviewed by your health care provider. You may be asked to change positions or hold your breath for a short time. This makes it easier to get different views or better views of your heart. In some cases, you may receive contrast through an IV in one of your veins. This can improve the quality of the pictures from your heart. The procedure may vary among health care providers and hospitals.   What can I expect after the test? You may return to your normal, everyday life, including diet, activities, and medicines, unless your health care provider tells you not to do that. Follow these instructions at home: It is up to you to get the results of your test. Ask your health care provider, or the department that is doing the test, when your results will be ready. Keep all follow-up visits. This is important. Summary An echocardiogram is a test that uses sound waves (ultrasound) to produce images of the heart. Images from an echocardiogram can provide important information about the size and shape of your heart, heart muscle function, heart valve function, and other possible heart problems. You do not need to do anything to prepare before this test. You may eat and drink normally. After the echocardiogram is completed, you may return to your normal, everyday life, unless your health care provider tells you not to do that. This information is not intended to replace advice given to you by your health care provider. Make sure you discuss any questions  you have with your health care provider. Document Revised: 09/04/2019 Document Reviewed: 09/04/2019 Elsevier Patient Education  2021 [employer]   Important Information About Sugar

## 2022-10-18 NOTE — Telephone Encounter (Signed)
Inbound call from patient requesting a refill for pantoprazole medication. Patient requesting a call back. Please advise, thank you.

## 2022-11-04 ENCOUNTER — Other Ambulatory Visit: Payer: Self-pay | Admitting: Surgery

## 2022-11-04 DIAGNOSIS — I712 Thoracic aortic aneurysm, without rupture, unspecified: Secondary | ICD-10-CM

## 2022-11-10 ENCOUNTER — Encounter: Payer: Self-pay | Admitting: Surgery

## 2022-11-19 ENCOUNTER — Ambulatory Visit: Payer: Medicare HMO

## 2022-11-19 DIAGNOSIS — I4819 Other persistent atrial fibrillation: Secondary | ICD-10-CM | POA: Diagnosis not present

## 2022-11-19 LAB — ECHOCARDIOGRAM COMPLETE
Area-P 1/2: 3.48 cm2
MV M vel: 3.61 m/s
MV Peak grad: 52.1 mm[Hg]
S' Lateral: 3.7 cm

## 2022-11-22 ENCOUNTER — Telehealth: Payer: Self-pay

## 2022-11-22 DIAGNOSIS — I4819 Other persistent atrial fibrillation: Secondary | ICD-10-CM

## 2022-11-22 NOTE — Telephone Encounter (Signed)
Pt c/o medication issue:  1. Name of Medication:  metoprolol tartrate (LOPRESSOR) 100 MG tablet   2. How are you currently taking this medication (dosage and times per day)? Only took one dose yesterday   3. Are you having a reaction (difficulty breathing--STAT)? Yes  4. What is your medication issue? Had dizzy spells with increase. Was advised by PCP that it sounded like he did not need to take metoprolol if his BP was under 100. Due to this he only took two tablets Friday & Saturday. He took one tablet yesterday and has not taken any today. Patient states since decreasing the medication his symptoms of dizziness have improved. He has BP readings recorded to discuss. This morning his BP read 106/84. At 10:00 pm last night it was 77/61, at supper time it read 80/62 and at 1:00 pm yesterday it was 76/59. Saturday his BP was 120/87, which is when he decided to take a tablet of the metoprolol. Patient states dizziness would occur when BP was in the low 70's. Please advise.

## 2022-11-22 NOTE — Telephone Encounter (Signed)
Patient is following up requesting advisement soon if possible.

## 2022-11-23 MED ORDER — METOPROLOL TARTRATE 75 MG PO TABS
75.0000 mg | ORAL_TABLET | Freq: Two times a day (BID) | ORAL | 3 refills | Status: DC
Start: 2022-11-23 — End: 2023-01-13

## 2022-11-23 NOTE — Telephone Encounter (Signed)
Patient is following-up on his high BP readings and medication.

## 2022-11-23 NOTE — Telephone Encounter (Signed)
[facility] navigator for Dr. Shary Decamp calling to get status on call/if pt has been reached out to.

## 2022-11-23 NOTE — Telephone Encounter (Signed)
 Recommendations reviewed with pt as per Dr. Madireddy's note.  Pt verbalized understanding and had no additional questions.

## 2022-11-23 NOTE — Telephone Encounter (Signed)
Pt c/o BP issue: STAT if pt c/o blurred vision, one-sided weakness or slurred speech  1. What are your last 5 BP readings?      BP 109/77  HR 57          109/80  HR 60           105/84  HR 93           77/61  HR 84              2. Are you having any other symptoms (ex. Dizziness, headache, blurred vision, passed out)?   No  3. What is your BP issue?   Patient stated he is concerned he only has 100 mg tablets and took only one metoprolol tartrate (LOPRESSOR) 100 MG tablet this morning.  Patient wants call back on next steps.

## 2022-11-24 ENCOUNTER — Telehealth: Payer: Self-pay | Admitting: Gastroenterology

## 2022-11-24 NOTE — Telephone Encounter (Signed)
Inbound call from patient, states he is having some stool issues, he is having black loose stools, and he would like to turn in a stool kit prior to appointment with Dr. Chales Abrahams on 11/4. Patient is requesting the order to be sent to [place] [facility]. Please advise.

## 2022-11-24 NOTE — Telephone Encounter (Signed)
No answer line rings then stops will attempt later

## 2022-11-25 NOTE — Telephone Encounter (Signed)
The pt is asking if he can bring stool with him to his office visit with Dr Chales Abrahams.  He has been advised to wait and see Dr Chales Abrahams and follow the recommendations at that office visit.

## 2022-11-29 ENCOUNTER — Other Ambulatory Visit (INDEPENDENT_AMBULATORY_CARE_PROVIDER_SITE_OTHER): Payer: Medicare HMO

## 2022-11-29 ENCOUNTER — Telehealth: Payer: Self-pay

## 2022-11-29 ENCOUNTER — Encounter: Payer: Self-pay | Admitting: Gastroenterology

## 2022-11-29 ENCOUNTER — Ambulatory Visit: Payer: Medicare HMO | Admitting: Gastroenterology

## 2022-11-29 VITALS — BP 90/62 | HR 70 | Ht 72.0 in | Wt 217.0 lb

## 2022-11-29 DIAGNOSIS — K7469 Other cirrhosis of liver: Secondary | ICD-10-CM

## 2022-11-29 DIAGNOSIS — Z8719 Personal history of other diseases of the digestive system: Secondary | ICD-10-CM

## 2022-11-29 DIAGNOSIS — R195 Other fecal abnormalities: Secondary | ICD-10-CM | POA: Diagnosis not present

## 2022-11-29 LAB — CBC WITH DIFFERENTIAL/PLATELET
Basophils Absolute: 0 10*3/uL (ref 0.0–0.1)
Basophils Relative: 0.7 % (ref 0.0–3.0)
Eosinophils Absolute: 0.1 10*3/uL (ref 0.0–0.7)
Eosinophils Relative: 1.8 % (ref 0.0–5.0)
HCT: 44.4 % (ref 39.0–52.0)
Hemoglobin: 14.1 g/dL (ref 13.0–17.0)
Lymphocytes Relative: 16.4 % (ref 12.0–46.0)
Lymphs Abs: 1 10*3/uL (ref 0.7–4.0)
MCHC: 31.6 g/dL (ref 30.0–36.0)
MCV: 97.5 fL (ref 78.0–100.0)
Monocytes Absolute: 0.3 10*3/uL (ref 0.1–1.0)
Monocytes Relative: 5.5 % (ref 3.0–12.0)
Neutro Abs: 4.8 10*3/uL (ref 1.4–7.7)
Neutrophils Relative %: 75.6 % (ref 43.0–77.0)
Platelets: 253 10*3/uL (ref 150.0–400.0)
RBC: 4.56 Mil/uL (ref 4.22–5.81)
RDW: 16.4 % — ABNORMAL HIGH (ref 11.5–15.5)
WBC: 6.3 10*3/uL (ref 4.0–10.5)

## 2022-11-29 LAB — COMPREHENSIVE METABOLIC PANEL
ALT: 10 U/L (ref 0–53)
AST: 14 U/L (ref 0–37)
Albumin: 3.5 g/dL (ref 3.5–5.2)
Alkaline Phosphatase: 22 U/L — ABNORMAL LOW (ref 39–117)
BUN: 20 mg/dL (ref 6–23)
CO2: 25 meq/L (ref 19–32)
Calcium: 8.6 mg/dL (ref 8.4–10.5)
Chloride: 103 meq/L (ref 96–112)
Creatinine, Ser: 1.59 mg/dL — ABNORMAL HIGH (ref 0.40–1.50)
GFR: 43.45 mL/min — ABNORMAL LOW (ref >60.00)
Glucose, Bld: 105 mg/dL — ABNORMAL HIGH (ref 70–99)
Potassium: 4.9 meq/L (ref 3.5–5.1)
Sodium: 139 meq/L (ref 135–145)
Total Bilirubin: 0.9 mg/dL (ref 0.2–1.2)
Total Protein: 6.1 g/dL (ref 6.0–8.3)

## 2022-11-29 LAB — PROTIME-INR
INR: 3.1 {ratio} — ABNORMAL HIGH (ref 0.8–1.0)
Prothrombin Time: 31 s — ABNORMAL HIGH (ref 9.6–13.1)

## 2022-11-29 MED ORDER — PANTOPRAZOLE SODIUM 40 MG PO TBEC: 40.0000 mg | DELAYED_RELEASE_TABLET | Freq: Two times a day (BID) | ORAL | 4 refills | Status: DC

## 2022-11-29 MED ORDER — HYDROCORTISONE ACETATE 25 MG RE SUPP: 25.0000 mg | Freq: Two times a day (BID) | RECTAL | 2 refills | Status: DC

## 2022-11-29 NOTE — Telephone Encounter (Signed)
[id number] Approved  Computed tomography, abdomen and pelvis; without contrast material  Authorization #[id number]  Tracking #[id number]  Dates of service 11/29/2022 - 01/28/2023  Performing facility or agency: [facility]

## 2022-11-29 NOTE — Patient Instructions (Signed)
_______________________________________________________  If your blood pressure at your visit was 140/90 or greater, please contact your primary care physician to follow up on this.  _______________________________________________________  If you are age 71 or older, your body mass index should be between 23-30. Your Body mass index is 29.43 kg/m. If this is out of the aforementioned range listed, please consider follow up with your Primary Care Provider.  If you are age 72 or younger, your body mass index should be between 19-25. Your Body mass index is 29.43 kg/m. If this is out of the aformentioned range listed, please consider follow up with your Primary Care Provider.   ________________________________________________________  The [facility] providers would like to encourage you to use [facility] to communicate with providers for non-urgent requests or questions.  Due to long hold times on the telephone, sending your provider a message by [facility] may be a faster and more efficient way to get a response.  Please allow 48 business hours for a response.  Please remember that this is for non-urgent requests.  _______________________________________________________  No NSAID's Avoid fatty foods. Small but frequent meals  We have given you samples of the following medication to take: Creon take 1 tablet with each meal   Your provider has requested that you go to the basement level for lab work before leaving today. Press "B" on the elevator. The lab is located at the first door on the left as you exit the elevator.  You have been scheduled for an appointment with Dr. Chales Abrahams on 12-29-2022 at 850am . Please arrive 10 minutes early for your appointment.  You have been scheduled for a CT scan of the abdomen and pelvis at [facility] ([address] [place], [place], [place] [address]).   You are scheduled on 12-01-2022 at 5pm. You should arrive by 245pm for your appointment time for registration.o  Please follow the written instructions below on the day of your exam:  WARNING: IF YOU ARE ALLERGIC TO IODINE/X-RAY DYE, PLEASE NOTIFY RADIOLOGY IMMEDIATELY AT [phone]! YOU WILL BE GIVEN A 13 HOUR PREMEDICATION PREP.  1) Do not eat or drink anything after 1pm (4 hours prior to your test) 2) You have been given 2 bottles of oral contrast to drink. The solution may taste better if refrigerated, but do NOT add ice or any other liquid to this solution. Shake well before drinking.    Drink 1 bottle of contrast @ 3pm (2 hours prior to your exam)  Drink 1 bottle of contrast @ 4pm (1 hour prior to your exam)  You may take any medications as prescribed with a small amount of water, if necessary. If you take any of the following medications: METFORMIN, GLUCOPHAGE, GLUCOVANCE, AVANDAMET, RIOMET, FORTAMET, ACTOPLUS MET, JANUMET, GLUMETZA or METAGLIP, you MAY be asked to HOLD this medication 48 hours AFTER the exam.  The purpose of you drinking the oral contrast is to aid in the visualization of your intestinal tract. The contrast solution may cause some diarrhea. Depending on your individual set of symptoms, you may also receive an intravenous injection of x-ray contrast/dye. Plan on being at [facility] for 30 minutes or longer, depending on the type of exam you are having performed.  This test typically takes 30-45 minutes to complete.  If you have any questions regarding your exam or if you need to reschedule, you may call the CT department at [phone] between the hours of 8:00 am and 5:00 pm, Monday-Friday.  ________________________________________________________________________  Thank you,  Dr.  Lynann Bologna

## 2022-11-29 NOTE — Progress Notes (Signed)
Chief Complaint: FU   Referring Provider:  Gordan Payment., MD      ASSESSMENT AND PLAN;   #1.  H+ brown stools (on eliquis).  I think it is because of hemorrhoids  #2. H/O acute idiopathic recurrent pancreatitis with pancreatic tail pseudocyst s/p distal pancreatectomy with debridement of pseudo pancreatic cyst and a splenectomy by Dr. Donell Beers in May 2021.   #3. Cryptogenic liver cirrhosis (likely d/t NASH, Dx 02/2018 on CT) with portal hypertension- moderate splenomegaly (on CT), mild thrombocytopenia, portal venous collaterals. Gd 1 eso varices on EGD (06/2018). Neg WU for etiology 2020. Immune to A but not to B. No ascites or HE. Nl AFP.  #4. Multiple comorbid conditions including A. fib on Eliquis, COPD on O2, OSA, CKD3 (Cr 1.59 09/2022), CAD, dCHF, DM2, obesity, remote PE.   Plan: -CBC, CMP, INR, AFP stat -Refill Protonix 40 BID #180, 4RF -No NSAIDs -Continue low salt diet.  Avoid fatty fried foods.  Small but more frequent meals. -Anusol HC supp BID x 10 days. -CT AP with oral contrast only (cannot use IV contrast due to to CKD) -Ideally needs EGD/colon but don't think he would be able to tolerate it @ this time.  Besides, he wants to hold off as well. If any active bleeding or significant drop in hemoglobin, stat ED. -Panc enzymes Creon (lip 36,000) 1 with each meal.  Samples given. -FU in 4 weeks    HPI:    CALE BETHARD is a 72 y.o. male  With A. fib on Eliquis, COPD on O2, OSA, CKD3 (Cr 1.59 09/2022), CAD, dCHF, DM2, obesity, remote PE.  Here for protonix refill  Occ N/V, nothing new. Occ abdominal pain after eating with some bloating. Loose stools 5-6/day on magnesium/metformin.  Creon helped previously Told me he is having dark stools-at times black but taking iron tablets Hb 13 09/30/2022  I did a rectal exam today-boggy internal hemorrhoids, brown stool but heme positive  Does not want any invasive procedures currently by means of EGD/colon I also do not  think he will be able to tolerate it currently  Last EGD at the time of EUS 06/2018 showing grade 1 esophageal varices, portal hypertensive gastropathy.  We had discussed changing metoprolol to coreg. Was not keen on doing it  Last colonoscopy 2009-fair prep, diminutive hyperplastic colonic polyp with pancolonic diverticulosis, internal hemorrhoids.   No weight loss.  Daughter had fracture of ankle recently.     Wt Readings from Last 3 Encounters:  11/29/22 217 lb (98.4 kg)  10/18/22 214 lb (97.1 kg)  09/15/22 216 lb 12.8 oz (98.3 kg)     Previous imaging studies :  CT AP 03/29/2019 1. Stable large pseudocyst 12.5x 9 cm in the tail of the pancreas. No interval change. 2. Portal hypertension with extensive venous collateralization. 3. New small splenic infarction.  - NCCT scan 09/22/2017 -showed large 7 into 9 cm pancreatic pseudocyst in the tail of the pancreas. - CT Abdo/pelvis with IV contrast  11/09/2018, 05/10/2018:  1. Pancreatic tail pseudocyst .7.5 x 6.5 by 5.9 cm 2. Hepatic cirrhosis and mild steatosis. No evidence of hepatic neoplasm. 3. Stable splenomegaly. -MRCP 02/2018 ? Hemo psudocyst -EGD/EUS 07/17/2018: Grade 1 esophageal varices, portal hypertensive gastropathy, cystic 6.5x5.4 cm pancreatic tail pseudocyst with surrounding blood vessels.  Colon 11/2007 4 mm Colon polyp s/p polypectomy. Bx- neg Pancolonic div Int hoids Fair prep Rpt 10 yrs   SH- daughter is a Hospital doctor of [employer]  (  April phone #[phone]) Past Medical History:  Diagnosis Date   Abnormal CT of the abdomen 05/27/2018   Abnormal MRI of abdomen 04/14/2018   Acute pancreatitis 02/20/2019   Anemia    Anxiety disorder 04/09/2015   Arthritis    Asthma    Atrial fibrillation (HCC) 11/21/2008   Qualifier: Diagnosis of   By: Ladona Ridgel, MD, Jerrell Mylar        CAD (coronary artery disease)    a. S/P PCI mid LAD (vision stent) 08/31/2004;  b. repeat cath 6/11 - no progression of  CAD. c. Cath 10/2012 given moderate CAD on cardiac CT - no change from prior cath.   Centrilobular emphysema (HCC) 10/08/2022   Chest CT 08/04/2022     Chest pain 12/16/2016   CHEST PAIN UNSPECIFIED 11/21/2008   Qualifier: Diagnosis of   By: Remer Macho, EMT-P, Tara         Chronic respiratory failure with hypoxia (HCC) 02/24/2017   Cirrhosis of liver without ascites (HCC) 04/14/2018   CKD (chronic kidney disease), stage III (HCC) 02/20/2019   COPD (chronic obstructive pulmonary disease) (HCC)    COPD without exacerbation (HCC) 05/15/2009   Qualifier: Diagnosis of   By: Vassie Loll MD, Comer Locket.         COVID-19 virus infection    Cyst of nipple, right 11/03/2020   Degeneration of lumbar intervertebral disc 04/09/2015   Diverticulitis    Dyslipidemia    Essential hypertension 10/15/2008   Qualifier: Diagnosis of   By: Flonnie Overman         Gallstones    GERD 10/15/2008   Qualifier: Diagnosis of   By: Flonnie Overman         High risk medication use 04/09/2015   History of COVID-19 01/24/19 02/20/2019   History of kidney stones    History of pelvic fracture 01/01/2020   History of tattoo 08/23/2016   History of UTI 11/28/2019   Hypercoagulable state due to persistent atrial fibrillation (HCC) 03/26/2022   Hyperlipidemia 10/15/2008   Qualifier: Diagnosis of   By: Flonnie Overman         Hypertension    Hyperthyroidism    HYPERTRIGLYCERIDEMIA 10/15/2008   Qualifier: Diagnosis of   By: Flonnie Overman         Hypotension 06/13/2019   Idiopathic acute pancreatitis 04/14/2018   Incisional hernia, without obstruction or gangrene 02/05/2016   Infected pancreatic pseudocyst 06/07/2019   Insomnia 04/09/2015   Iron deficiency anemia 05/03/2022   Kidney mass 07/06/2017   Following with urology. Chao.  Recommend follow up. Last seen 01/2018  Refuses to see again.   CT okay 04/2021     Long term (current) use of anticoagulants [Z79.01] 06/10/2015   Melanoma (HCC)    melanoma removed from neck    Mild  episode of recurrent major depressive disorder (HCC) 08/27/2015   NEPHROLITHIASIS 10/15/2008   Qualifier: Diagnosis of   By: Flonnie Overman         OA (osteoarthritis) of hip 12/18/2014   On home oxygen therapy    "2L q hs" (11/08/2012),  06/04/19 pt. not on oxygen   OSA (obstructive sleep apnea)    a. cpap noncompliance- patient reports on 12/10/14- does not use CPAP   Palpitations    a. 10/2012 - s/p LINQ loop recorder to assess for arrhythmia.    Pancreatic cyst 04/14/2018   Pancreatic pseudocyst 06/07/2019   Pancreatitis    Paroxysmal A-fib (HCC) 11/21/2008   a. S/P PVI 2/11 and 9/11  Pelvic fracture (HCC) 12/31/2019   Persistent atrial fibrillation (HCC) 06/08/2022   PICC (peripherally inserted central catheter) in place    Pneumonia    Protein-calorie malnutrition, severe 06/12/2019   Pseudocyst of pancreas 03/06/2019   Pure hypercholesterolemia    S/P coronary artery stent placement    SHORTNESS OF BREATH 03/19/2009   Qualifier: Diagnosis of   By: Johney Frame MD, James         Sleep apnea 05/17/2009   Qualifier: Diagnosis of   By: Vassie Loll MD, Comer Locket.         SVT (supraventricular tachycardia) (HCC)    a. Atypical AVNRT of the slow pathway   Thoracic aortic aneurysm (HCC) 11/13/2020   Type II diabetes mellitus (HCC)    Unstable angina (HCC) 10/15/2008   Qualifier: Diagnosis of   By: Flonnie Overman          Past Surgical History:  Procedure Laterality Date   ATRIAL ABLATION SURGERY  2007   S/P slow pathway ablation for atypical AVNRT    BIOPSY  07/17/2018   Procedure: BIOPSY;  Surgeon: Lemar Lofty., MD;  Location: [facility] ENDOSCOPY;  Service: Gastroenterology;;   CARDIOVERSION N/A 04/13/2022   Procedure: CARDIOVERSION;  Surgeon: Chrystie Nose, MD;  Location: [facility] ENDOSCOPY;  Service: Cardiovascular;  Laterality: N/A;   CARDIOVERSION N/A 06/10/2022   Procedure: CARDIOVERSION;  Surgeon: Wendall Stade, MD;  Location: [facility] INVASIVE CV LAB;  Service: Cardiovascular;   Laterality: N/A;   CHOLECYSTECTOMY  ~ 2008   COLON RESECTION  02/2013    COLONOSCOPY  12/25/2007   Colonic polyp status post polypectomy. Internal hemorrhoids.    CORONARY ANGIOPLASTY WITH STENT PLACEMENT  08/30/2004   Vision; to LAD    CYSTOSCOPY W/ STONE MANIPULATION  ~ 1998   "once" (11/08/2012)   EP Study  2008   Negative EP study in [place]   ESOPHAGOGASTRODUODENOSCOPY (EGD) WITH PROPOFOL N/A 07/17/2018   Procedure: ESOPHAGOGASTRODUODENOSCOPY (EGD) WITH PROPOFOL;  Surgeon: Lemar Lofty., MD;  Location: [facility] ENDOSCOPY;  Service: Gastroenterology;  Laterality: N/A;   FRACTURE SURGERY     LEFT HEART CATHETERIZATION WITH CORONARY ANGIOGRAM N/A 11/10/2012   Procedure: LEFT HEART CATHETERIZATION WITH CORONARY ANGIOGRAM;  Surgeon: Kathleene Hazel, MD;  Location: [facility] CATH LAB;  Service: Cardiovascular;  Laterality: N/A;   LOOP RECORDER IMPLANT N/A 11/10/2012   Medtronic LinQ implanted by Dr Johney Frame for afib management   LUMBAR DISC SURGERY  2000; 2002   POSTERIOR LUMBAR FUSION  2004   PROSTATE SURGERY  ~ 2009   PVI/ CTI ablation  02/25/2009 and 09/2009   S/P afib/ CTI ablation   SPLENECTOMY, TOTAL N/A 06/07/2019   Procedure: SPLENECTOMY;  Surgeon: Almond Lint, MD;  Location: [facility] OR;  Service: General;  Laterality: N/A;   TIBIA FRACTURE SURGERY Right 1990   "broke in 3 places; had rod put in" (11/08/2012)   TIBIA HARDWARE REMOVAL Right ~ 1991   TONSILLECTOMY  1965   TOTAL HIP ARTHROPLASTY Right 12/18/2014   Procedure: RIGHT TOTAL HIP ARTHROPLASTY ANTERIOR APPROACH;  Surgeon: Ollen Gross, MD;  Location: [facility] ORS;  Service: Orthopedics;  Laterality: Right;   UPPER ESOPHAGEAL ENDOSCOPIC ULTRASOUND (EUS) N/A 07/17/2018   Procedure: UPPER ESOPHAGEAL ENDOSCOPIC ULTRASOUND (EUS);  Surgeon: Lemar Lofty., MD;  Location: [facility] ENDOSCOPY;  Service: Gastroenterology;  Laterality: N/A;    Family History  Problem Relation Age of Onset   Emphysema Mother    Heart attack Father         CVA  Hypertension Father    Diabetes Father    Heart disease Father        MI   Emphysema Maternal Grandfather        Smoker   Heart disease Paternal Grandfather    Colon cancer Neg Hx    Esophageal cancer Neg Hx    Inflammatory bowel disease Neg Hx    Liver disease Neg Hx    Pancreatic cancer Neg Hx    Rectal cancer Neg Hx    Stomach cancer Neg Hx     Social History   Tobacco Use   Smoking status: Former    Current packs/day: 0.00    Average packs/day: 3.0 packs/day for 38.0 years (114.0 ttl pk-yrs)    Types: Cigarettes    Start date: 03/26/1966    Quit date: 03/25/2004    Years since quitting: 18.6   Smokeless tobacco: Former    Types: Chew    Quit date: 04/25/2004   Tobacco comments:    Former smoker 03/26/22  Vaping Use   Vaping status: Never Used  Substance Use Topics   Alcohol use: Not Currently    Comment:  "quit drinking in 1989"   Drug use: No    Current Outpatient Medications  Medication Sig Dispense Refill   acetaminophen (TYLENOL) 500 MG tablet Take 1,000 mg by mouth every 6 (six) hours as needed for mild pain, fever or headache.     albuterol (VENTOLIN HFA) 108 (90 Base) MCG/ACT inhaler Inhale 1-2 puffs into the lungs as needed for wheezing or shortness of breath.     apixaban (ELIQUIS) 5 MG TABS tablet Take 1 tablet by mouth twice daily 60 tablet 6   baclofen (LIORESAL) 10 MG tablet Take 10 mg by mouth at bedtime.     BREZTRI AEROSPHERE 160-9-4.8 MCG/ACT AERO Inhale 1 puff into the lungs 2 (two) times daily.     Cyanocobalamin (VITAMIN B-12 IJ) Inject 1,000 mcg as directed every 30 (thirty) days.     docusate sodium (COLACE) 100 MG capsule Take 1 capsule (100 mg total) by mouth 2 (two) times daily as needed for mild constipation. 10 capsule 0   escitalopram (LEXAPRO) 10 MG tablet Take 1 tablet by mouth daily.     fenofibrate 160 MG tablet Take 160 mg by mouth daily.     ferrous sulfate 325 (65 FE) MG tablet Take 325 mg by mouth daily with breakfast.      furosemide (LASIX) 20 MG tablet Take 40 mg by mouth daily as needed for fluid or edema.     gabapentin (NEURONTIN) 300 MG capsule Take 1 capsule (300 mg total) by mouth 3 (three) times daily. 90 capsule 0   Insulin Glargine (BASAGLAR KWIKPEN) 100 UNIT/ML Inject 35 Units into the skin in the morning.     Insulin Pen Needle 29G X MISC 1 Device by Does not apply route 3 (three) times daily as needed (for use with insulin pen). 100 each 0   JARDIANCE 25 MG TABS tablet Take 25 mg by mouth daily.     magnesium oxide (MAG-OX) 400 MG tablet Take 1 tablet (400 mg total) by mouth daily. 30 tablet 6   Melatonin 10 MG TABS Take 20 mg by mouth at bedtime.     metFORMIN (GLUCOPHAGE) 1000 MG tablet Take 1,000 mg by mouth 2 (two) times daily.     metoprolol tartrate 75 MG TABS Take 1 tablet (75 mg total) by mouth 2 (two) times daily. 180 tablet 3  Multiple Vitamins-Minerals (PRESERVISION AREDS) TABS Take 1 tablet by mouth daily.     nitroGLYCERIN (NITROSTAT) 0.4 MG SL tablet Place 0.4 mg under the tongue every 5 (five) minutes as needed for chest pain.     Oxycodone HCl 10 MG TABS Take 1 tablet by mouth daily as needed (For pain). Take 1 tablet every 4-6 hours as needed     oxyCODONE-acetaminophen (PERCOCET) 10-325 MG tablet Take 1 tablet by mouth every 6 (six) hours as needed for pain.     OXYGEN Inhale 2 L into the lungs continuous.     pantoprazole (PROTONIX) 40 MG tablet Take 1 tablet (40 mg total) by mouth 2 (two) times daily. 180 tablet 0   Tamsulosin HCl (FLOMAX) 0.4 MG CAPS Take 0.4 mg by mouth daily.     zolpidem (AMBIEN) 5 MG tablet Take 0.5 tablets (2.5 mg total) by mouth at bedtime as needed for sleep. 30 tablet 0   No current facility-administered medications for this visit.    Allergies  Allergen Reactions   Penicillins Hives and Shortness Of Breath    Did it involve swelling of the face/tongue/throat, SOB, or low BP? Yes Did it involve sudden or severe rash/hives, skin peeling, or any  reaction on the inside of your mouth or nose? No Did you need to seek medical attention at a hospital or doctor's office? Yes (emergency room) When did it last happen?  1974 If all above answers are "NO", may proceed with cephalosporin use.      Review of Systems:  neg     Physical Exam:    BP 90/62   Pulse 70   Ht 6' (1.829 m)   Wt 217 lb (98.4 kg)   BMI 29.43 kg/m  Filed Weights   11/29/22 0906  Weight: 217 lb (98.4 kg)   Constitutional:  Well-developed, in no acute distress. Psychiatric: Normal mood and affect. Behavior is normal. Abdominal exam: Mild generalized abdominal tenderness.  Well-healed surgical scar. Lungs: Bilateral decreased breath sounds. Rectal examination was performed in presence of Brooke-boggy internal hemorrhoids, brown stool, heme positive.  Data Reviewed: I have personally reviewed following labs and imaging studies     Latest Ref Rng & Units 04/09/2022    8:26 AM 01/07/2020    1:44 AM 01/06/2020    1:24 AM  CBC  WBC 4.0 - 10.5 K/uL 9.1  10.7  12.4   Hemoglobin 13.0 - 17.0 g/dL 59.5  63.8  75.6   Hematocrit 39.0 - 52.0 % 42.5  34.8  34.5   Platelets 150 - 400 K/uL 390  382  348      CBC:    Latest Ref Rng & Units 04/09/2022    8:26 AM 01/07/2020    1:44 AM 01/06/2020    1:24 AM  CBC  WBC 4.0 - 10.5 K/uL 9.1  10.7  12.4   Hemoglobin 13.0 - 17.0 g/dL 43.3  29.5  18.8   Hematocrit 39.0 - 52.0 % 42.5  34.8  34.5   Platelets 150 - 400 K/uL 390  382  348     CMP:    Latest Ref Rng & Units 06/11/2022   11:05 AM 06/11/2022    3:36 AM 06/10/2022    3:11 AM  CMP  Glucose 70 - 99 mg/dL  416  57   BUN 8 - 23 mg/dL  12  13   Creatinine 6.06 - 1.24 mg/dL  3.01  6.01   Sodium 093 - 145 mmol/L  136  136   Potassium 3.5 - 5.1 mmol/L  4.0  4.0   Chloride 98 - 111 mmol/L  104  103   CO2 22 - 32 mmol/L  24  24   Calcium 8.9 - 10.3 mg/dL  8.8  8.6   Total Protein 6.5 - 8.1 g/dL 6.7     Total Bilirubin 0.3 - 1.2 mg/dL 0.9     Alkaline Phos 38  - 126 U/L 32     AST 15 - 41 U/L 25     ALT 0 - 44 U/L 16       Edman Circle, MD 11/29/2022, 9:23 AM  Cc: Gordan Payment., MD

## 2022-11-30 LAB — AFP TUMOR MARKER: AFP-Tumor Marker: 2.9 ng/mL (ref <6.1)

## 2022-12-01 ENCOUNTER — Ambulatory Visit (HOSPITAL_COMMUNITY)
Admission: RE | Admit: 2022-12-01 | Discharge: 2022-12-01 | Disposition: A | Discharge status: Home / Self Care | Payer: Medicare HMO | Source: Ambulatory Visit | Attending: Gastroenterology | Admitting: Gastroenterology

## 2022-12-01 DIAGNOSIS — R195 Other fecal abnormalities: Secondary | ICD-10-CM | POA: Diagnosis present

## 2022-12-01 DIAGNOSIS — Z8719 Personal history of other diseases of the digestive system: Secondary | ICD-10-CM | POA: Diagnosis present

## 2022-12-01 DIAGNOSIS — K7469 Other cirrhosis of liver: Secondary | ICD-10-CM | POA: Insufficient documentation

## 2022-12-01 MED ORDER — IOHEXOL 300 MG/ML  SOLN
30.0000 mL | Freq: Once | INTRAMUSCULAR | Status: AC | PRN
  Administered 2022-12-01: 30 mL via ORAL

## 2022-12-06 ENCOUNTER — Encounter: Payer: Self-pay | Admitting: Cardiology

## 2022-12-06 ENCOUNTER — Encounter: Payer: Self-pay | Admitting: *Deleted

## 2022-12-06 ENCOUNTER — Ambulatory Visit: Payer: Medicare HMO | Admitting: Cardiology

## 2022-12-06 ENCOUNTER — Ambulatory Visit: Payer: Medicare HMO | Attending: Cardiology | Admitting: Cardiology

## 2022-12-06 VITALS — BP 100/62 | HR 142 | Ht 72.0 in | Wt 209.0 lb

## 2022-12-06 DIAGNOSIS — I2510 Atherosclerotic heart disease of native coronary artery without angina pectoris: Secondary | ICD-10-CM

## 2022-12-06 DIAGNOSIS — I10 Essential (primary) hypertension: Secondary | ICD-10-CM | POA: Diagnosis not present

## 2022-12-06 DIAGNOSIS — I4819 Other persistent atrial fibrillation: Secondary | ICD-10-CM | POA: Diagnosis not present

## 2022-12-06 DIAGNOSIS — D6869 Other thrombophilia: Secondary | ICD-10-CM

## 2022-12-06 NOTE — Patient Instructions (Signed)
Medication Instructions:  Your physician recommends that you continue on your current medications as directed. Please refer to the Current Medication list given to you today.     * If you need a refill on your cardiac medications before your next appointment, please call your pharmacy. *   Labwork: None ordered   Testing/Procedures: Your physician has recommended that you have an AV node ablation + pacemaker inserted. A pacemaker is a small device that is placed under the skin of your chest or abdomen to help control abnormal heart rhythms. This device uses electrical pulses to prompt the heart to beat at a normal rate. Pacemakers are used to treat heart rhythms that are too slow. Wire (leads) are attached to the pacemaker that goes into the chambers of you heart. This is done in the hospital and usually requires and overnight stay. Please follow your instruction letter.   Follow-Up: You will be scheduled for a 2 week wound check and 3 month physician check.  * Please note that any paperwork needing to be filled out by the provider will need to be addressed at the front desk prior to seeing the provider.  Please note that any FMLA, disability or other documents regarding health condition is subject to a $25.00 charge that must be received prior to completion of paperwork in the form of a money order or check. *  Thank you for choosing [facility]!!   Dory Horn, RN [phone]   Other Instructions     Pacemaker Implantation, Adult Pacemaker implantation is a procedure to place a pacemaker inside your chest. A pacemaker is a small computer that sends electrical signals to the heart and helps your heart beat normally. A pacemaker also stores information about your heart rhythms. You may need pacemaker implantation if you: Have a slow heartbeat (bradycardia). Faint (syncope). Have shortness of breath (dyspnea) due to heart problems.  The pacemaker attaches to your heart  through a wire, called a lead. Sometimes just one lead is needed. Other times, there will be two leads. There are two types of pacemakers: Transvenous pacemaker. This type is placed under the skin or muscle of your chest. The lead goes through a vein in the chest area to reach the inside of the heart. Epicardial pacemaker. This type is placed under the skin or muscle of your chest or belly. The lead goes through your chest to the outside of the heart.  Tell a health care provider about: Any allergies you have. All medicines you are taking, including vitamins, herbs, eye drops, creams, and over-the-counter medicines. Any problems you or family members have had with anesthetic medicines. Any blood or bone disorders you have. Any surgeries you have had. Any medical conditions you have. Whether you are pregnant or may be pregnant. What are the risks? Generally, this is a safe procedure. However, problems may occur, including: Infection. Bleeding. Failure of the pacemaker or the lead. Collapse of a lung or bleeding into a lung. Blood clot inside a blood vessel with a lead. Damage to the heart. Infection inside the heart (endocarditis). Allergic reactions to medicines.  What happens before the procedure? Staying hydrated Follow instructions from your health care provider about hydration, which may include: Up to 2 hours before the procedure - you may continue to drink clear liquids, such as water, clear fruit juice, black coffee, and plain tea.  Eating and drinking restrictions Follow instructions from your health care provider about eating and drinking, which may include: 8 hours  before the procedure - stop eating heavy meals or foods such as meat, fried foods, or fatty foods. 6 hours before the procedure - stop eating light meals or foods, such as toast or cereal. 6 hours before the procedure - stop drinking milk or drinks that contain milk. 2 hours before the procedure - stop drinking  clear liquids.  Medicines Ask your health care provider about: Changing or stopping your regular medicines. This is especially important if you are taking diabetes medicines or blood thinners. Taking medicines such as aspirin and ibuprofen. These medicines can thin your blood. Do not take these medicines before your procedure if your health care provider instructs you not to. You may be given antibiotic medicine to help prevent infection. General instructions You will have a heart evaluation. This may include an electrocardiogram (ECG), chest X-ray, and heart imaging (echocardiogram,  or echo) tests. You will have blood tests. Do not use any products that contain nicotine or tobacco, such as cigarettes and e-cigarettes. If you need help quitting, ask your health care provider. Plan to have someone take you home from the hospital or clinic. If you will be going home right after the procedure, plan to have someone with you for 24 hours. Ask your health care provider how your surgical site will be marked or identified. What happens during the procedure? To reduce your risk of infection: Your health care team will wash or sanitize their hands. Your skin will be washed with soap. Hair may be removed from the surgical area. An IV tube will be inserted into one of your veins. You will be given one or more of the following: A medicine to help you relax (sedative). A medicine to numb the area (local anesthetic). A medicine to make you fall asleep (general anesthetic). If you are getting a transvenous pacemaker: An incision will be made in your upper chest. A pocket will be made for the pacemaker. It may be placed under the skin or between layers of muscle. The lead will be inserted into a blood vessel that returns to the heart. While X-rays are taken by an imaging machine (fluoroscopy), the lead will be advanced through the vein to the inside of your heart. The other end of the lead will be  tunneled under the skin and attached to the pacemaker. If you are getting an epicardial pacemaker: An incision will be made near your ribs or breastbone (sternum) for the lead. The lead will be attached to the outside of your heart. Another incision will be made in your chest or upper belly to create a pocket for the pacemaker. The free end of the lead will be tunneled under the skin and attached to the pacemaker. The transvenous or epicardial pacemaker will be tested. Imaging studies may be done to check the lead position. The incisions will be closed with stitches (sutures), adhesive strips, or skin glue. Bandages (dressing) will be placed over the incisions. The procedure may vary among health care providers and hospitals. What happens after the procedure? Your blood pressure, heart rate, breathing rate, and blood oxygen level will be monitored until the medicines you were given have worn off. You will be given antibiotics and pain medicine. ECG and chest x-rays will be done. You will wear a continuous type of ECG (Holter monitor) to check your heart rhythm. Your health care provider will program the pacemaker. Do not drive for 24 hours if you received a sedative. This information is not intended to replace advice given  to you by your health care provider. Make sure you discuss any questions you have with your health care provider. Document Released: 01/01/2002 Document Revised: 08/01/2015 Document Reviewed: 06/25/2015 Elsevier Interactive Patient Education  2018 [employer].     Pacemaker Implantation, Adult, Care After This sheet gives you information about how to care for yourself after your procedure. Your health care provider may also give you more specific instructions. If you have problems or questions, contact your health care provider. What can I expect after the procedure? After the procedure, it is common to have: Mild pain. Slight bruising. Some swelling over the  incision. A slight bump over the skin where the device was placed. Sometimes, it is possible to feel the device under the skin. This is normal.  Follow these instructions at home: Medicines Take over-the-counter and prescription medicines only as told by your health care provider. If you were prescribed an antibiotic medicine, take it as told by your health care provider. Do not stop taking the antibiotic even if you start to feel better. Wound care Do not remove the bandage on your chest until directed to do so by your health care provider. After your bandage is removed, you may see pieces of tape called skin adhesive strips over the area where the cut was made (incision site). Let them fall off on their own. Check the incision site every day to make sure it is not infected, bleeding, or starting to pull apart. Do not use lotions or ointments near the incision site unless directed to do so. Keep the incision area clean and dry for 2-3 days after the procedure or as directed by your health care provider. It takes several weeks for the incision site to completely heal. Do not take baths, swim, or use a hot tub for 7-10 days or as otherwise directed by your health care provider. Activity Do not drive or use heavy machinery while taking prescription pain medicine. Do not drive for 24 hours if you were given a medicine to help you relax (sedative). Check with your health care provider before you start to drive or play sports. Avoid sudden jerking, pulling, or chopping movements that pull your upper arm far away from your body. Avoid these movements for at least 6 weeks or as long as told by your health care provider. Do not lift your upper arm above your shoulders for at least 6 weeks or as long as told by your health care provider. This means no tennis, golf, or swimming. You may go back to work when your health care provider says it is okay. Pacemaker care You may be shown how to transfer data  from your pacemaker through the phone to your health care provider. Always let all health care providers know about your pacemaker before you have any medical procedures or tests. Wear a medical ID bracelet or necklace stating that you have a pacemaker. Carry a pacemaker ID card with you at all times. Your pacemaker battery will last for 5-15 years. Routine checks by your health care provider will let the health care provider know when the battery is starting to run down. The pacemaker will need to be replaced when the battery starts to run down. Do not use amateur Proofreader. Other electrical devices are safe to use, including power tools, lawn mowers, and speakers. If you are unsure of whether something is safe to use, ask your health care provider. When using your cell phone, hold it  to the ear opposite the pacemaker. Do not leave your cell phone in a pocket over the pacemaker. Avoid places or objects that have a strong electric or magnetic field, including: Scientist, physiological. When at the airport, let officials know that you have a pacemaker. Power plants. Large electrical generators. Radiofrequency transmission towers, such as cell phone and radio towers. General instructions Weigh yourself every day. If you suddenly gain weight, fluid may be building up in your body. Keep all follow-up visits as told by your health care provider. This is important. Contact a health care provider if: You gain weight suddenly. Your legs or feet swell. It feels like your heart is fluttering or skipping beats (heart palpitations). You have chills or a fever. You have more redness, swelling, or pain around your incisions. You have more fluid or blood coming from your incisions. Your incisions feel warm to the touch. You have pus or a bad smell coming from your incisions. Get help right away if: You have chest pain. You have trouble breathing or are short of breath. You  become extremely tired. You are light-headed or you faint. This information is not intended to replace advice given to you by your health care provider. Make sure you discuss any questions you have with your health care provider. Document Released: 07/31/2004 Document Revised: 10/24/2015 Document Reviewed: 10/24/2015 Elsevier Interactive Patient Education  2018 [employer].    Supplemental Discharge Instructions for  Pacemaker/Defibrillator Patients  ACTIVITY No heavy lifting or vigorous activity with your left/right arm for 6 to 8 weeks.  Do not raise your left/right arm above your head for one week.  Gradually raise your affected arm as drawn below.           __  NO DRIVING for     ; you may begin driving on     .  WOUND CARE Keep the wound area clean and dry.  Do not get this area wet for one week. No showers for one week; you may shower on     . The tape/steri-strips on your wound will fall off; do not pull them off.  No bandage is needed on the site.  DO  NOT apply any creams, oils, or ointments to the wound area. If you notice any drainage or discharge from the wound, any swelling or bruising at the site, or you develop a fever > 101? F after you are discharged home, call the office at once.  SPECIAL INSTRUCTIONS You are still able to use cellular telephones; use the ear opposite the side where you have your pacemaker/defibrillator.  Avoid carrying your cellular phone near your device. When traveling through airports, show security personnel your identification card to avoid being screened in the metal detectors.  Ask the security personnel to use the hand wand. Avoid arc welding equipment, MRI testing (magnetic resonance imaging), TENS units (transcutaneous nerve stimulators).  Call the office for questions about other devices. Avoid electrical appliances that are in poor condition or are not properly grounded. Microwave ovens are safe to be near or to operate.  ADDITIONAL  INFORMATION FOR DEFIBRILLATOR PATIENTS SHOULD YOUR DEVICE GO OFF: If your device goes off ONCE and you feel fine afterward, notify the device clinic nurses. If your device goes off ONCE and you do not feel well afterward, call 911. If your device goes off TWICE, call 911. If your device goes off THREE TIMES IN ONE DAY, call 911.  DO NOT DRIVE YOURSELF OR A FAMILY MEMBER  WITH A DEFIBRILLATOR TO THE HOSPITAL--CALL 911.

## 2022-12-06 NOTE — Progress Notes (Signed)
Electrophysiology Office Note:   Date:  12/06/2022  ID:  Steve Anthony, DOB 02-21-1950, MRN [id number]  Primary Cardiologist: None Electrophysiologist: Luvina Poirier Jorja Loa, MD      History of Present Illness:   Steve Anthony is a 72 y.o. male with h/o coronary artery disease, COPD, hyperlipidemia, SVT, diabetes, sleep apnea, atrial fibrillation/flutter seen today for routine electrophysiology followup.   Since last being seen in our clinic the patient reports continued weakness, fatigue, shortness of breath.  He states that his heart rates at home have been significantly elevated over the last few months.  He is on chronic oxygen.  He is having difficulty doing his daily activities due to rapid heart rates and shortness of breath.  he denies chest pain, palpitations, dyspnea, PND, orthopnea, nausea, vomiting, dizziness, syncope, edema, weight gain, or early satiety.   Review of systems complete and found to be negative unless listed in HPI.   EP Information / Studies Reviewed:    EKG is ordered today. Personal review as below.  EKG Interpretation Date/Time:  Monday December 06 2022 15:20:45 EST Ventricular Rate:  142 PR Interval:    QRS Duration:  88 QT Interval:  290 QTC Calculation: 446 R Axis:   -35  Text Interpretation: Atrial fibrillation with rapid ventricular response with premature ventricular or aberrantly conducted complexes Left axis deviation Low voltage QRS Septal infarct (cited on or before 18-Oct-2022) ST & T wave abnormality, consider lateral ischemia When compared with ECG of 18-Oct-2022 12:51, No significant change since last tracing Confirmed by Joud Ingwersen ([id number]) on 12/06/2022 3:29:19 PM     Risk Assessment/Calculations:    CHA2DS2-VASc Score = 4   This indicates a 4.8% annual risk of stroke. The patient's score is based upon: CHF History: 0 HTN History: 1 Diabetes History: 1 Stroke History: 0 Vascular Disease History: 1 Age Score: 1 Gender Score:  0             Physical Exam:   VS:  BP 100/62   Pulse (!) 142   Ht 6' (1.829 m)   Wt 209 lb (94.8 kg)   SpO2 95%   BMI 28.35 kg/m    Wt Readings from Last 3 Encounters:  12/06/22 209 lb (94.8 kg)  11/29/22 217 lb (98.4 kg)  10/18/22 214 lb (97.1 kg)     GEN: Well nourished, well developed in no acute distress NECK: No JVD; No carotid bruits CARDIAC: Irregularly irregular rate and rhythm, no murmurs, rubs, gallops RESPIRATORY:  Clear to auscultation without rales, wheezing or rhonchi  ABDOMEN: Soft, non-tender, non-distended EXTREMITIES:  No edema; No deformity   ASSESSMENT AND PLAN:    1.  Persistent atrial fibrillation: Post ablation in 2011.  Failed dofetilide, and had nausea and weight loss on amiodarone.  At this point, due to his chronic medical history, his only option is rate control.  He is on medical therapy, and despite this continues to have rapid heart rates.  He would benefit from CRT-P implant and AV node ablation.  Risk and benefits have been discussed.  Risk include bleeding, tamponade, infection, pneumothorax, lead dislodgment, MI, death, stroke, renal failure.  He understands these risks and is agreed procedure  2.  Hypertension: Well-controlled  3.  Coronary artery disease: Post LAD stent.  Plan per primary cardiology.  4.  Ascending aortic aneurysm: Plan per thoracic surgery  5.  Secondary hypercoagulable state: Currently on Eliquis for atrial fibrillation  Follow up with Dr. Elberta Fortis as usual post procedure  Signed, Adelfo Diebel Jorja Loa, MD

## 2022-12-06 NOTE — H&P (View-Only) (Signed)
 Electrophysiology Office Note:   Date:  12/06/2022  ID:  Steve Anthony, DOB 02-21-1950, MRN [id number]  Primary Cardiologist: None Electrophysiologist: Luvina Poirier Jorja Loa, MD      History of Present Illness:   Steve Anthony is a 72 y.o. male with h/o coronary artery disease, COPD, hyperlipidemia, SVT, diabetes, sleep apnea, atrial fibrillation/flutter seen today for routine electrophysiology followup.   Since last being seen in our clinic the patient reports continued weakness, fatigue, shortness of breath.  He states that his heart rates at home have been significantly elevated over the last few months.  He is on chronic oxygen.  He is having difficulty doing his daily activities due to rapid heart rates and shortness of breath.  he denies chest pain, palpitations, dyspnea, PND, orthopnea, nausea, vomiting, dizziness, syncope, edema, weight gain, or early satiety.   Review of systems complete and found to be negative unless listed in HPI.   EP Information / Studies Reviewed:    EKG is ordered today. Personal review as below.  EKG Interpretation Date/Time:  Monday December 06 2022 15:20:45 EST Ventricular Rate:  142 PR Interval:    QRS Duration:  88 QT Interval:  290 QTC Calculation: 446 R Axis:   -35  Text Interpretation: Atrial fibrillation with rapid ventricular response with premature ventricular or aberrantly conducted complexes Left axis deviation Low voltage QRS Septal infarct (cited on or before 18-Oct-2022) ST & T wave abnormality, consider lateral ischemia When compared with ECG of 18-Oct-2022 12:51, No significant change since last tracing Confirmed by Joud Ingwersen ([id number]) on 12/06/2022 3:29:19 PM     Risk Assessment/Calculations:    CHA2DS2-VASc Score = 4   This indicates a 4.8% annual risk of stroke. The patient's score is based upon: CHF History: 0 HTN History: 1 Diabetes History: 1 Stroke History: 0 Vascular Disease History: 1 Age Score: 1 Gender Score:  0             Physical Exam:   VS:  BP 100/62   Pulse (!) 142   Ht 6' (1.829 m)   Wt 209 lb (94.8 kg)   SpO2 95%   BMI 28.35 kg/m    Wt Readings from Last 3 Encounters:  12/06/22 209 lb (94.8 kg)  11/29/22 217 lb (98.4 kg)  10/18/22 214 lb (97.1 kg)     GEN: Well nourished, well developed in no acute distress NECK: No JVD; No carotid bruits CARDIAC: Irregularly irregular rate and rhythm, no murmurs, rubs, gallops RESPIRATORY:  Clear to auscultation without rales, wheezing or rhonchi  ABDOMEN: Soft, non-tender, non-distended EXTREMITIES:  No edema; No deformity   ASSESSMENT AND PLAN:    1.  Persistent atrial fibrillation: Post ablation in 2011.  Failed dofetilide, and had nausea and weight loss on amiodarone.  At this point, due to his chronic medical history, his only option is rate control.  He is on medical therapy, and despite this continues to have rapid heart rates.  He would benefit from CRT-P implant and AV node ablation.  Risk and benefits have been discussed.  Risk include bleeding, tamponade, infection, pneumothorax, lead dislodgment, MI, death, stroke, renal failure.  He understands these risks and is agreed procedure  2.  Hypertension: Well-controlled  3.  Coronary artery disease: Post LAD stent.  Plan per primary cardiology.  4.  Ascending aortic aneurysm: Plan per thoracic surgery  5.  Secondary hypercoagulable state: Currently on Eliquis for atrial fibrillation  Follow up with Dr. Elberta Fortis as usual post procedure  Signed, Adelfo Diebel Jorja Loa, MD

## 2022-12-09 NOTE — Telephone Encounter (Signed)
Patient called said he has an appt scheduled for a CT on 12/17/22 but he just had a CT scan done not sure if he still needs both done. Please advise.

## 2022-12-09 NOTE — Telephone Encounter (Addendum)
Advised patient to called the cardiothoracic because that is one who ordered that CT coming up. He already has done our CT and he voiced understanding

## 2022-12-16 ENCOUNTER — Ambulatory Visit: Payer: Medicare HMO

## 2022-12-17 ENCOUNTER — Other Ambulatory Visit: Payer: Self-pay

## 2022-12-17 ENCOUNTER — Ambulatory Visit
Admission: RE | Admit: 2022-12-17 | Discharge: 2022-12-17 | Disposition: A | Discharge status: Home / Self Care | Payer: Medicare HMO | Source: Ambulatory Visit | Attending: Surgery | Admitting: Surgery

## 2022-12-17 DIAGNOSIS — R195 Other fecal abnormalities: Secondary | ICD-10-CM

## 2022-12-17 DIAGNOSIS — I509 Heart failure, unspecified: Secondary | ICD-10-CM

## 2022-12-17 DIAGNOSIS — I712 Thoracic aortic aneurysm, without rupture, unspecified: Secondary | ICD-10-CM

## 2022-12-17 DIAGNOSIS — R791 Abnormal coagulation profile: Secondary | ICD-10-CM

## 2022-12-17 DIAGNOSIS — K7469 Other cirrhosis of liver: Secondary | ICD-10-CM

## 2022-12-17 HISTORY — DX: Heart failure, unspecified: I50.9

## 2022-12-17 NOTE — Progress Notes (Signed)
CBC

## 2022-12-20 ENCOUNTER — Telehealth: Payer: Self-pay

## 2022-12-20 NOTE — Telephone Encounter (Signed)
Pt made aware reviewed w/ Dr. Harold Hedge. Pt aware that as long as the abx are prophylaxis this shouldn't be an issue to proceed with planned ablation & PPM implant next week. Pt agreeable to plan.

## 2022-12-20 NOTE — Telephone Encounter (Signed)
Pt states he was put on antibiotic for a place that was cut off his arm and wants to make sure it is not going to affect the procedure on 12/2. Please advise

## 2022-12-20 NOTE — Telephone Encounter (Signed)
Pt reports that he had some skin cancer removed from his arm on Friday & started a 10 day course of Clindamycin. Scheduled for AVN ablation w/ PPM implant on 12/2. Pt aware will discuss w/ MD today and let him know.  Pt agreeable to plan.

## 2022-12-20 NOTE — Telephone Encounter (Signed)
Pt calling back to make nurse aware that due to new finding in his skin culture they have now changed his medication from Clindamycin to Doxycycline. He would like a c/b regarding whether this medication will interfere with him having Ablation. Please advise

## 2022-12-22 ENCOUNTER — Encounter: Payer: Self-pay | Admitting: Surgery

## 2022-12-22 ENCOUNTER — Ambulatory Visit: Payer: Medicare HMO | Admitting: Surgery

## 2022-12-22 ENCOUNTER — Other Ambulatory Visit (INDEPENDENT_AMBULATORY_CARE_PROVIDER_SITE_OTHER): Payer: Medicare HMO

## 2022-12-22 VITALS — BP 93/66 | HR 67 | Resp 20 | Ht 72.0 in | Wt 209.0 lb

## 2022-12-22 DIAGNOSIS — K7469 Other cirrhosis of liver: Secondary | ICD-10-CM | POA: Diagnosis not present

## 2022-12-22 DIAGNOSIS — R791 Abnormal coagulation profile: Secondary | ICD-10-CM

## 2022-12-22 DIAGNOSIS — R911 Solitary pulmonary nodule: Secondary | ICD-10-CM

## 2022-12-22 DIAGNOSIS — R195 Other fecal abnormalities: Secondary | ICD-10-CM

## 2022-12-22 LAB — CBC WITH DIFFERENTIAL/PLATELET
Basophils Absolute: 0 K/uL (ref 0.0–0.1)
Basophils Relative: 0.7 % (ref 0.0–3.0)
Eosinophils Absolute: 0.1 K/uL (ref 0.0–0.7)
Eosinophils Relative: 1.9 % (ref 0.0–5.0)
HCT: 42.3 % (ref 39.0–52.0)
Hemoglobin: 13.5 g/dL (ref 13.0–17.0)
Lymphocytes Relative: 20.7 % (ref 12.0–46.0)
Lymphs Abs: 1.3 K/uL (ref 0.7–4.0)
MCHC: 32 g/dL (ref 30.0–36.0)
MCV: 96.2 fl (ref 78.0–100.0)
Monocytes Absolute: 0.7 K/uL (ref 0.1–1.0)
Monocytes Relative: 11.9 % (ref 3.0–12.0)
Neutro Abs: 3.9 K/uL (ref 1.4–7.7)
Neutrophils Relative %: 64.8 % (ref 43.0–77.0)
Platelets: 327 K/uL (ref 150.0–400.0)
RBC: 4.39 Mil/uL (ref 4.22–5.81)
RDW: 17.9 % — ABNORMAL HIGH (ref 11.5–15.5)
WBC: 6 K/uL (ref 4.0–10.5)

## 2022-12-22 LAB — PROTIME-INR
INR: 3 {ratio} — ABNORMAL HIGH (ref 0.8–1.0)
Prothrombin Time: 30.6 s — ABNORMAL HIGH (ref 9.6–13.1)

## 2022-12-22 NOTE — Progress Notes (Signed)
HPI:  The patient is a 72 year old gentleman with a history of coronary artery disease status post PCI in 2006, diabetes, hypertension, hyperlipidemia, oxygen dependent COPD, stage III chronic kidney disease and atrial fibrillation followed by EP with multiple prior ablations.  He has been followed in our office for a stable 4.4 cm fusiform ascending aortic aneurysm.  He is scheduled for AV node ablation and CRT-P implant in the near future.  His health has been declining with significant weight loss, weakness, fatigue, and shortness of breath.  I last saw him in the office on 08/05/2022 and CT scan of the chest at that time showed new volume loss and nodularity medially in the left upper lobe with associated nodularity along the mediastinal margin of the lung.  Radiology recommended follow-up CT in 3 months and he returns today with that study.  Current Outpatient Medications  Medication Sig Dispense Refill   acetaminophen (TYLENOL) 500 MG tablet Take 1,000 mg by mouth every 6 (six) hours as needed for mild pain, fever or headache.     albuterol (VENTOLIN HFA) 108 (90 Base) MCG/ACT inhaler Inhale 1-2 puffs into the lungs as needed for wheezing or shortness of breath.     apixaban (ELIQUIS) 5 MG TABS tablet Take 1 tablet by mouth twice daily 60 tablet 6   baclofen (LIORESAL) 10 MG tablet Take 10 mg by mouth every 8 (eight) hours as needed for muscle spasms.     BREZTRI AEROSPHERE 160-9-4.8 MCG/ACT AERO Inhale 1 puff into the lungs 2 (two) times daily.     Cyanocobalamin (VITAMIN B-12 IJ) Inject 1,000 mcg as directed every 30 (thirty) days.     docusate sodium (COLACE) 100 MG capsule Take 1 capsule (100 mg total) by mouth 2 (two) times daily as needed for mild constipation. (Patient taking differently: Take 100 mg by mouth 2 (two) times daily.) 10 capsule 0   escitalopram (LEXAPRO) 10 MG tablet Take 10 mg by mouth daily.     fenofibrate 160 MG tablet Take 160 mg by mouth daily.     ferrous sulfate  325 (65 FE) MG tablet Take 325 mg by mouth daily with breakfast.     furosemide (LASIX) 20 MG tablet Take 40 mg by mouth daily as needed for fluid or edema.     gabapentin (NEURONTIN) 300 MG capsule Take 1 capsule (300 mg total) by mouth 3 (three) times daily. 90 capsule 0   hydrocortisone (ANUSOL-HC) 25 MG suppository Place 1 suppository (25 mg total) rectally 2 (two) times daily. Take for 10 days 20 suppository 2   Insulin Glargine (BASAGLAR KWIKPEN) 100 UNIT/ML Inject 25-35 Units into the skin daily as needed (Sliding scale in the morning).     Insulin Pen Needle 29G X MISC 1 Device by Does not apply route 3 (three) times daily as needed (for use with insulin pen). 100 each 0   JARDIANCE 25 MG TABS tablet Take 25 mg by mouth daily.     magnesium oxide (MAG-OX) 400 MG tablet Take 1 tablet (400 mg total) by mouth daily. 30 tablet 6   Melatonin 10 MG CAPS Take 20 mg by mouth at bedtime.     metFORMIN (GLUCOPHAGE) 1000 MG tablet Take 1,000 mg by mouth 2 (two) times daily.     metoprolol tartrate 75 MG TABS Take 1 tablet (75 mg total) by mouth 2 (two) times daily. 180 tablet 3   Multiple Vitamins-Minerals (PRESERVISION AREDS) TABS Take 1 tablet by mouth 2 (two)  times daily.     nitroGLYCERIN (NITROSTAT) 0.4 MG SL tablet Place 0.4 mg under the tongue every 5 (five) minutes as needed for chest pain.     ondansetron (ZOFRAN) 4 MG tablet Take 4 mg by mouth every 8 (eight) hours as needed for nausea or vomiting.     Oxycodone HCl 10 MG TABS Take 1 tablet by mouth daily as needed (For pain). Take 1 tablet every 4-6 hours as needed     OXYGEN Inhale 2-4 L into the lungs continuous.     pantoprazole (PROTONIX) 40 MG tablet Take 1 tablet (40 mg total) by mouth 2 (two) times daily. 180 tablet 4   Tamsulosin HCl (FLOMAX) 0.4 MG CAPS Take 0.4 mg by mouth daily.     zolpidem (AMBIEN) 5 MG tablet Take 0.5 tablets (2.5 mg total) by mouth at bedtime as needed for sleep. (Patient taking differently: Take 5 mg by  mouth at bedtime.) 30 tablet 0   No current facility-administered medications for this visit.     Physical Exam: BP 93/66   Pulse 67   Resp 20   Ht 6' (1.829 m)   Wt 209 lb (94.8 kg)   SpO2 93% Comment: 2L O2 per [place]  BMI 28.35 kg/m  He is a frail chronically ill-appearing gentleman on oxygen. Cardiac exam shows an irregular rate and rhythm with normal heart sounds.  There is no murmur. Lung exam is clear. There is mild bilateral lower extremity edema.   Diagnostic Tests:  Narrative & Impression  CLINICAL DATA:  Follow-up ascending thoracic aortic aneurysm. Follow-up left upper lobe nodularity seen on prior CT scan.   EXAM: CT CHEST WITHOUT CONTRAST   TECHNIQUE: Multidetector CT imaging of the chest was performed following the standard protocol without IV contrast.   RADIATION DOSE REDUCTION: This exam was performed according to the departmental dose-optimization program which includes automated exposure control, adjustment of the mA and/or kV according to patient size and/or use of iterative reconstruction technique.   COMPARISON:  Multiple prior CT scans.  The most recent is 08/04/2022   FINDINGS: Cardiovascular: The heart is normal in size. No pericardial effusion. Stable 4.3 cm fusiform aneurysmal dilatation of the ascending thoracic aorta. Recommend annual imaging followup by CTA or MRA. This recommendation follows 2010 ACCF/AHA/AATS/ACR/ASA/SCA/SCAI/SIR/STS/SVM Guidelines for the Diagnosis and Management of Patients with Thoracic Aortic Disease. Circulation. 2010; 121: W098-J191. Aortic aneurysm NOS (ICD10-I71.9).   Stable scattered aortic and three-vessel coronary artery calcifications and LAD stent.   Mediastinum/Nodes: Persistent scattered borderline mediastinal and hilar lymph nodes. No new progressive findings. The esophagus is grossly normal. The thyroid gland is unremarkable.   Lungs/Pleura: New moderate-sized bilateral pleural  effusions. Associated overlying atelectasis.   The medial left upper lobe nodularity has resolved and was likely inflammation or atelectasis. Persistent and slight worsening of lingular atelectasis and scarring.   Stable underlying emphysematous changes and areas of pulmonary scarring.   Stable 3 mm right middle lobe pulmonary nodule on image 96/5. Stable small scattered calcified granulomas.   Upper Abdomen: No acute upper abdominal findings. Status post splenectomy. Simple bilateral renal cysts. Stable vascular calcifications. Mild diffuse body wall edema.   Musculoskeletal: No significant bony findings. Stable degenerative changes involving the thoracic spine.   IMPRESSION: 1. Stable 4.3 cm fusiform aneurysmal dilatation of the ascending thoracic aorta. 2. New moderate-sized bilateral pleural effusions with associated overlying atelectasis. 3. The medial left upper lobe nodularity has resolved and was likely inflammation or atelectasis. 4. Stable 3 mm right  middle lobe pulmonary nodule. 5. Stable scattered borderline mediastinal and hilar lymph nodes. 6. Stable underlying emphysematous changes and areas of pulmonary scarring and calcified granulomas. 7. Stable aortic and three-vessel coronary artery calcifications and LAD stent.   Aortic Atherosclerosis (ICD10-I70.0) and Emphysema (ICD10-J43.9).     Electronically Signed   By: Rudie Meyer M.D.   On: 12/17/2022 11:06      Impression:  He has a stable 4.3 cm fusiform ascending aortic aneurysm.  This is well below the surgical threshold of 5.5 cm.  He is a chronically ill and frail gentleman with multiple comorbidities which would preclude cardiac surgery.  I do not think there is a need to continue following this small stable aneurysm in this patient.  The nodularity noted on the previous CT in the medial left upper lobe has resolved and was most likely inflammation or atelectasis.  There are some other stable nodules.   I reviewed the CT images with him and his wife and answered all their questions. I stressed the importance of continued good blood pressure control in preventing further enlargement and acute aortic dissection.  I advised him against doing any heavy lifting that may require a Valsalva maneuver and could suddenly raise his blood pressure to high levels.   Plan:  He will continue to follow-up with cardiology and his PCP.  I will be happy to see him back if the need arises but I do not think he will have any issues with this small ascending aortic aneurysm.  I spent 10 minutes performing this established patient evaluation and > 50% of this time was spent face to face counseling and coordinating the care of this patient's aortic aneurysm and lung nodules.   Alleen Borne, MD [facility] [phone]

## 2022-12-24 NOTE — Pre-Procedure Instructions (Signed)
Instructed patient on the following items: Arrival time 1230, new arrival time Nothing to eat or drink after midnight No meds AM of procedure Responsible person to drive you home and stay with you for 24 hrs Wash with special soap night before and morning of procedure If on anti-coagulant drug instructions Eliquis- last dose was 11/28

## 2022-12-27 ENCOUNTER — Ambulatory Visit (HOSPITAL_COMMUNITY)
Admission: RE | Admit: 2022-12-27 | Discharge: 2022-12-28 | Disposition: A | Discharge status: Home / Self Care | Payer: Medicare HMO | Source: Ambulatory Visit | Attending: Cardiology | Admitting: Cardiology

## 2022-12-27 ENCOUNTER — Telehealth: Payer: Self-pay | Admitting: Cardiology

## 2022-12-27 ENCOUNTER — Ambulatory Visit (HOSPITAL_COMMUNITY)
Admission: RE | Disposition: A | Discharge status: Home / Self Care | Payer: Medicare HMO | Source: Ambulatory Visit | Attending: Cardiology

## 2022-12-27 ENCOUNTER — Other Ambulatory Visit: Payer: Self-pay

## 2022-12-27 DIAGNOSIS — D6869 Other thrombophilia: Secondary | ICD-10-CM | POA: Diagnosis not present

## 2022-12-27 DIAGNOSIS — G4733 Obstructive sleep apnea (adult) (pediatric): Secondary | ICD-10-CM | POA: Diagnosis not present

## 2022-12-27 DIAGNOSIS — E119 Type 2 diabetes mellitus without complications: Secondary | ICD-10-CM | POA: Insufficient documentation

## 2022-12-27 DIAGNOSIS — Z79899 Other long term (current) drug therapy: Secondary | ICD-10-CM | POA: Diagnosis not present

## 2022-12-27 DIAGNOSIS — I110 Hypertensive heart disease with heart failure: Secondary | ICD-10-CM | POA: Insufficient documentation

## 2022-12-27 DIAGNOSIS — I471 Supraventricular tachycardia, unspecified: Secondary | ICD-10-CM | POA: Insufficient documentation

## 2022-12-27 DIAGNOSIS — I4819 Other persistent atrial fibrillation: Secondary | ICD-10-CM | POA: Diagnosis not present

## 2022-12-27 DIAGNOSIS — I2510 Atherosclerotic heart disease of native coronary artery without angina pectoris: Secondary | ICD-10-CM | POA: Insufficient documentation

## 2022-12-27 DIAGNOSIS — Z955 Presence of coronary angioplasty implant and graft: Secondary | ICD-10-CM | POA: Diagnosis not present

## 2022-12-27 DIAGNOSIS — Z9981 Dependence on supplemental oxygen: Secondary | ICD-10-CM | POA: Diagnosis not present

## 2022-12-27 DIAGNOSIS — Z7901 Long term (current) use of anticoagulants: Secondary | ICD-10-CM | POA: Insufficient documentation

## 2022-12-27 DIAGNOSIS — J449 Chronic obstructive pulmonary disease, unspecified: Secondary | ICD-10-CM | POA: Insufficient documentation

## 2022-12-27 DIAGNOSIS — I429 Cardiomyopathy, unspecified: Secondary | ICD-10-CM

## 2022-12-27 DIAGNOSIS — I4821 Permanent atrial fibrillation: Secondary | ICD-10-CM | POA: Diagnosis present

## 2022-12-27 DIAGNOSIS — I7121 Aneurysm of the ascending aorta, without rupture: Secondary | ICD-10-CM | POA: Diagnosis not present

## 2022-12-27 DIAGNOSIS — E785 Hyperlipidemia, unspecified: Secondary | ICD-10-CM | POA: Diagnosis not present

## 2022-12-27 DIAGNOSIS — I428 Other cardiomyopathies: Secondary | ICD-10-CM | POA: Diagnosis not present

## 2022-12-27 DIAGNOSIS — I5022 Chronic systolic (congestive) heart failure: Secondary | ICD-10-CM | POA: Insufficient documentation

## 2022-12-27 HISTORY — PX: AV NODE ABLATION: EP1193

## 2022-12-27 HISTORY — PX: BIV PACEMAKER INSERTION CRT-P: EP1199

## 2022-12-27 LAB — HEMOGLOBIN A1C
Hgb A1c MFr Bld: 6.2 % — ABNORMAL HIGH (ref 4.8–5.6)
Mean Plasma Glucose: 131.24 mg/dL

## 2022-12-27 LAB — GLUCOSE, CAPILLARY
Glucose-Capillary: 105 mg/dL — ABNORMAL HIGH (ref 70–99)
Glucose-Capillary: 126 mg/dL — ABNORMAL HIGH (ref 70–99)

## 2022-12-27 SURGERY — AV NODE ABLATION

## 2022-12-27 MED ORDER — INSULIN GLARGINE-YFGN 100 UNIT/ML ~~LOC~~ SOLN
25.0000 [IU] | Freq: Every day | SUBCUTANEOUS | Status: DC
Start: 2022-12-28 — End: 2022-12-28
  Administered 2022-12-28: 25 [IU] via SUBCUTANEOUS
  Filled 2022-12-27: qty 0.25

## 2022-12-27 MED ORDER — TAMSULOSIN HCL 0.4 MG PO CAPS
0.4000 mg | ORAL_CAPSULE | Freq: Every day | ORAL | Status: DC
  Administered 2022-12-28: 0.4 mg via ORAL
  Filled 2022-12-27: qty 1

## 2022-12-27 MED ORDER — ZOLPIDEM TARTRATE 5 MG PO TABS
2.5000 mg | ORAL_TABLET | Freq: Every evening | ORAL | Status: DC | PRN
  Administered 2022-12-27: 2.5 mg via ORAL
  Filled 2022-12-27: qty 1

## 2022-12-27 MED ORDER — UMECLIDINIUM BROMIDE 62.5 MCG/ACT IN AEPB
1.0000 | INHALATION_SPRAY | Freq: Every day | RESPIRATORY_TRACT | Status: DC
  Administered 2022-12-27 – 2022-12-28 (×2): 1 via RESPIRATORY_TRACT
  Filled 2022-12-27: qty 7

## 2022-12-27 MED ORDER — MOMETASONE FURO-FORMOTEROL FUM 200-5 MCG/ACT IN AERO
2.0000 | INHALATION_SPRAY | Freq: Two times a day (BID) | RESPIRATORY_TRACT | Status: DC
  Administered 2022-12-27 – 2022-12-28 (×2): 2 via RESPIRATORY_TRACT
  Filled 2022-12-27: qty 8.8

## 2022-12-27 MED ORDER — BASAGLAR KWIKPEN 100 UNIT/ML ~~LOC~~ SOPN
25.0000 [IU] | PEN_INJECTOR | Freq: Every day | SUBCUTANEOUS | Status: DC
  Filled 2022-12-27: qty 3

## 2022-12-27 MED ORDER — SODIUM CHLORIDE 0.9% FLUSH
3.0000 mL | Freq: Two times a day (BID) | INTRAVENOUS | Status: DC
  Administered 2022-12-27: 3 mL via INTRAVENOUS

## 2022-12-27 MED ORDER — ONDANSETRON HCL 4 MG PO TABS
4.0000 mg | ORAL_TABLET | Freq: Three times a day (TID) | ORAL | Status: DC | PRN
  Administered 2022-12-28: 4 mg via ORAL
  Filled 2022-12-27: qty 1

## 2022-12-27 MED ORDER — FENOFIBRATE 160 MG PO TABS
160.0000 mg | ORAL_TABLET | Freq: Every day | ORAL | Status: DC
Start: 2022-12-28 — End: 2022-12-28
  Administered 2022-12-28: 160 mg via ORAL
  Filled 2022-12-27: qty 1

## 2022-12-27 MED ORDER — INSULIN PEN NEEDLE 29G X 5MM MISC: 1.0000 | Freq: Three times a day (TID) | Status: DC | PRN

## 2022-12-27 MED ORDER — ESCITALOPRAM OXALATE 10 MG PO TABS
10.0000 mg | ORAL_TABLET | Freq: Every day | ORAL | Status: DC
  Administered 2022-12-28: 10 mg via ORAL
  Filled 2022-12-27: qty 1

## 2022-12-27 MED ORDER — FUROSEMIDE 40 MG PO TABS: 40.0000 mg | ORAL_TABLET | Freq: Every day | ORAL | Status: DC | PRN

## 2022-12-27 MED ORDER — SODIUM CHLORIDE 0.9 % IV SOLN
80.0000 mg | INTRAVENOUS | Status: AC
  Administered 2022-12-27: 80 mg

## 2022-12-27 MED ORDER — FERROUS SULFATE 325 (65 FE) MG PO TABS
325.0000 mg | ORAL_TABLET | Freq: Every day | ORAL | Status: DC
  Administered 2022-12-28: 325 mg via ORAL
  Filled 2022-12-27: qty 1

## 2022-12-27 MED ORDER — INSULIN ASPART 100 UNIT/ML IJ SOLN: 0.0000 [IU] | Freq: Three times a day (TID) | INTRAMUSCULAR | Status: DC

## 2022-12-27 MED ORDER — PANTOPRAZOLE SODIUM 40 MG PO TBEC
40.0000 mg | DELAYED_RELEASE_TABLET | Freq: Two times a day (BID) | ORAL | Status: DC
  Administered 2022-12-27 – 2022-12-28 (×2): 40 mg via ORAL
  Filled 2022-12-27 (×2): qty 1

## 2022-12-27 MED ORDER — FENTANYL CITRATE (PF) 100 MCG/2ML IJ SOLN
INTRAMUSCULAR | Status: AC
  Filled 2022-12-27: qty 2

## 2022-12-27 MED ORDER — VANCOMYCIN HCL IN DEXTROSE 1-5 GM/200ML-% IV SOLN: 1000.0000 mg | INTRAVENOUS | Status: AC

## 2022-12-27 MED ORDER — IOHEXOL 350 MG/ML SOLN
INTRAVENOUS | Status: DC | PRN
  Administered 2022-12-27: 5 mL

## 2022-12-27 MED ORDER — ALBUTEROL SULFATE (2.5 MG/3ML) 0.083% IN NEBU: 2.5000 mg | INHALATION_SOLUTION | RESPIRATORY_TRACT | Status: DC | PRN

## 2022-12-27 MED ORDER — SODIUM CHLORIDE 0.9 % IV SOLN
INTRAVENOUS | Status: AC
  Filled 2022-12-27: qty 2

## 2022-12-27 MED ORDER — METFORMIN HCL 500 MG PO TABS
1000.0000 mg | ORAL_TABLET | Freq: Two times a day (BID) | ORAL | Status: DC
  Administered 2022-12-27 – 2022-12-28 (×2): 1000 mg via ORAL
  Filled 2022-12-27 (×2): qty 2

## 2022-12-27 MED ORDER — VANCOMYCIN HCL IN DEXTROSE 1-5 GM/200ML-% IV SOLN
1000.0000 mg | Freq: Two times a day (BID) | INTRAVENOUS | Status: AC
  Administered 2022-12-28: 1000 mg via INTRAVENOUS
  Filled 2022-12-27: qty 200

## 2022-12-27 MED ORDER — INSULIN ASPART 100 UNIT/ML IJ SOLN: 0.0000 [IU] | Freq: Every day | INTRAMUSCULAR | Status: DC

## 2022-12-27 MED ORDER — CHLORHEXIDINE GLUCONATE 4 % EX SOLN: 4.0000 | Freq: Once | CUTANEOUS | Status: DC

## 2022-12-27 MED ORDER — VANCOMYCIN HCL IN DEXTROSE 1-5 GM/200ML-% IV SOLN
INTRAVENOUS | Status: AC
  Administered 2022-12-27: 1000 mg via INTRAVENOUS
  Filled 2022-12-27: qty 200

## 2022-12-27 MED ORDER — SODIUM CHLORIDE 0.9 % IV SOLN: 250.0000 mL | INTRAVENOUS | Status: DC | PRN

## 2022-12-27 MED ORDER — BUPIVACAINE HCL (PF) 0.25 % IJ SOLN
INTRAMUSCULAR | Status: AC
  Filled 2022-12-27: qty 30

## 2022-12-27 MED ORDER — OXYCODONE HCL 5 MG PO TABS
10.0000 mg | ORAL_TABLET | Freq: Every day | ORAL | Status: DC | PRN
  Administered 2022-12-27: 10 mg via ORAL
  Filled 2022-12-27: qty 2

## 2022-12-27 MED ORDER — ACETAMINOPHEN 500 MG PO TABS: 1000.0000 mg | ORAL_TABLET | Freq: Four times a day (QID) | ORAL | Status: DC | PRN

## 2022-12-27 MED ORDER — MOMETASONE FURO-FORMOTEROL FUM 200-5 MCG/ACT IN AERO
2.0000 | INHALATION_SPRAY | Freq: Two times a day (BID) | RESPIRATORY_TRACT | Status: DC
  Filled 2022-12-27: qty 8.8

## 2022-12-27 MED ORDER — MAGNESIUM OXIDE -MG SUPPLEMENT 400 (240 MG) MG PO TABS
400.0000 mg | ORAL_TABLET | Freq: Every day | ORAL | Status: DC
  Administered 2022-12-28: 400 mg via ORAL
  Filled 2022-12-27 (×2): qty 1

## 2022-12-27 MED ORDER — FENTANYL CITRATE (PF) 100 MCG/2ML IJ SOLN
INTRAMUSCULAR | Status: DC | PRN
  Administered 2022-12-27: 25 ug via INTRAVENOUS

## 2022-12-27 MED ORDER — MIDAZOLAM HCL 5 MG/5ML IJ SOLN
INTRAMUSCULAR | Status: DC | PRN
  Administered 2022-12-27: 1 mg via INTRAVENOUS

## 2022-12-27 MED ORDER — OXYCODONE HCL 5 MG PO TABS
10.0000 mg | ORAL_TABLET | Freq: Four times a day (QID) | ORAL | Status: DC | PRN
  Administered 2022-12-28 (×2): 10 mg via ORAL
  Filled 2022-12-27 (×3): qty 2

## 2022-12-27 MED ORDER — EMPAGLIFLOZIN 25 MG PO TABS
25.0000 mg | ORAL_TABLET | Freq: Every day | ORAL | Status: DC
  Administered 2022-12-27 – 2022-12-28 (×2): 25 mg via ORAL
  Filled 2022-12-27 (×2): qty 1

## 2022-12-27 MED ORDER — PROSIGHT PO TABS
1.0000 | ORAL_TABLET | Freq: Two times a day (BID) | ORAL | Status: DC
  Administered 2022-12-27 – 2022-12-28 (×2): 1 via ORAL
  Filled 2022-12-27 (×2): qty 1

## 2022-12-27 MED ORDER — SODIUM CHLORIDE 0.9% FLUSH: 3.0000 mL | INTRAVENOUS | Status: DC | PRN

## 2022-12-27 MED ORDER — SODIUM CHLORIDE 0.9 % IV SOLN: INTRAVENOUS | Status: DC

## 2022-12-27 MED ORDER — BUDESON-GLYCOPYRROL-FORMOTEROL 160-9-4.8 MCG/ACT IN AERO
1.0000 | INHALATION_SPRAY | Freq: Two times a day (BID) | RESPIRATORY_TRACT | Status: DC
Start: 2022-12-27 — End: 2022-12-27

## 2022-12-27 MED ORDER — NITROGLYCERIN 0.4 MG SL SUBL: 0.4000 mg | SUBLINGUAL_TABLET | SUBLINGUAL | Status: DC | PRN

## 2022-12-27 MED ORDER — METOPROLOL TARTRATE 25 MG PO TABS
75.0000 mg | ORAL_TABLET | Freq: Two times a day (BID) | ORAL | Status: DC
  Administered 2022-12-27 – 2022-12-28 (×2): 75 mg via ORAL
  Filled 2022-12-27 (×2): qty 3

## 2022-12-27 MED ORDER — LIDOCAINE HCL (PF) 1 % IJ SOLN
INTRAMUSCULAR | Status: DC | PRN
  Administered 2022-12-27: 60 mL

## 2022-12-27 MED ORDER — DOCUSATE SODIUM 100 MG PO CAPS: 100.0000 mg | ORAL_CAPSULE | Freq: Two times a day (BID) | ORAL | Status: DC | PRN

## 2022-12-27 MED ORDER — UMECLIDINIUM BROMIDE 62.5 MCG/ACT IN AEPB
1.0000 | INHALATION_SPRAY | Freq: Every day | RESPIRATORY_TRACT | Status: DC
  Filled 2022-12-27: qty 7

## 2022-12-27 MED ORDER — ONDANSETRON HCL 4 MG/2ML IJ SOLN: 4.0000 mg | Freq: Four times a day (QID) | INTRAMUSCULAR | Status: DC | PRN

## 2022-12-27 MED ORDER — HEPARIN (PORCINE) IN NACL 1000-0.9 UT/500ML-% IV SOLN
INTRAVENOUS | Status: DC | PRN
  Administered 2022-12-27 (×2): 500 mL

## 2022-12-27 MED ORDER — BUPIVACAINE HCL (PF) 0.25 % IJ SOLN
INTRAMUSCULAR | Status: DC | PRN
  Administered 2022-12-27: 20 mL

## 2022-12-27 MED ORDER — APIXABAN 5 MG PO TABS
5.0000 mg | ORAL_TABLET | Freq: Two times a day (BID) | ORAL | Status: DC
  Administered 2022-12-27: 5 mg via ORAL
  Filled 2022-12-27: qty 1

## 2022-12-27 MED ORDER — BACLOFEN 10 MG PO TABS
10.0000 mg | ORAL_TABLET | Freq: Three times a day (TID) | ORAL | Status: DC | PRN
  Administered 2022-12-27: 10 mg via ORAL
  Filled 2022-12-27: qty 1

## 2022-12-27 MED ORDER — MELATONIN 5 MG PO TABS
20.0000 mg | ORAL_TABLET | Freq: Every day | ORAL | Status: DC
  Administered 2022-12-27: 20 mg via ORAL
  Filled 2022-12-27: qty 4

## 2022-12-27 MED ORDER — GABAPENTIN 300 MG PO CAPS
300.0000 mg | ORAL_CAPSULE | Freq: Three times a day (TID) | ORAL | Status: DC
  Administered 2022-12-27 – 2022-12-28 (×2): 300 mg via ORAL
  Filled 2022-12-27 (×2): qty 1

## 2022-12-27 MED ORDER — LIDOCAINE HCL 1 % IJ SOLN
INTRAMUSCULAR | Status: AC
  Filled 2022-12-27: qty 60

## 2022-12-27 MED ORDER — POVIDONE-IODINE 10 % EX SWAB
2.0000 | Freq: Once | CUTANEOUS | Status: AC
  Administered 2022-12-27: 2 via TOPICAL

## 2022-12-27 MED ORDER — MIDAZOLAM HCL 5 MG/5ML IJ SOLN
INTRAMUSCULAR | Status: AC
  Filled 2022-12-27: qty 5

## 2022-12-27 SURGICAL SUPPLY — 22 items
BALLN COR SINUS VENO 6FR 80 (BALLOONS) ×1
BALLOON COR SINUS VENO 6FR 80 (BALLOONS) IMPLANT
CABLE SURGICAL S-101-97-12 (CABLE) ×2 IMPLANT
CATH BLAZERPRIME XP LG CV 10MM (ABLATOR) IMPLANT
CATH CPS DIRECT 135 DS2C020 (CATHETERS) IMPLANT
CATH CPS LOCATOR 3D MED (CATHETERS) IMPLANT
CLOSURE MYNX CONTROL 6F/7F (Vascular Products) IMPLANT
HELIX LOCKING TOOL (MISCELLANEOUS) ×1
LEAD QUARTET 1458Q-86CM (Lead) IMPLANT
LEAD ULTIPACE 65 LPA1231/65 (Lead) IMPLANT
PACEMAKER QUDR ALLR CRT PM3562 (Pacemaker) IMPLANT
PACK EP LF (CUSTOM PROCEDURE TRAY) ×2 IMPLANT
PAD DEFIB RADIO PHYSIO CONN (PAD) ×2 IMPLANT
PMKR QUADRA ALLURE CRT PM3562 (Pacemaker) ×1 IMPLANT
SHEATH 9FR PRELUDE SNAP 13 (SHEATH) IMPLANT
SHEATH PINNACLE 8F 10CM (SHEATH) IMPLANT
SHEATH PROBE COVER 6X72 (BAG) IMPLANT
SLITTER AGILIS HISPRO (INSTRUMENTS) IMPLANT
TOOL HELIX LOCKING (MISCELLANEOUS) IMPLANT
TRAY PACEMAKER INSERTION (PACKS) ×2 IMPLANT
WIRE ACUITY WHISPER EDS 4648 (WIRE) IMPLANT
WIRE HI TORQ VERSACORE-J 145CM (WIRE) IMPLANT

## 2022-12-27 NOTE — Telephone Encounter (Signed)
[facility] called in to make Dr. Elberta Fortis aware prior to his pacemaker implant today. Pt is in being treated for an infection on his forearm. She states he is about to finish his antibiotics today but just wanted to make you all aware.

## 2022-12-27 NOTE — Telephone Encounter (Signed)
Dr. Elberta Fortis made aware last week.

## 2022-12-27 NOTE — Interval H&P Note (Signed)
History and Physical Interval Note:  12/27/2022 12:55 PM  Steve Anthony  has presented today for surgery, with the diagnosis of afib.  The various methods of treatment have been discussed with the patient and family. After consideration of risks, benefits and other options for treatment, the patient has consented to  Procedure(s): AV NODE ABLATION (N/A) BIV PACEMAKER INSERTION CRT-P (N/A) as a surgical intervention.  The patient's history has been reviewed, patient examined, no change in status, stable for surgery.  I have reviewed the patient's chart and labs.  Questions were answered to the patient's satisfaction.     Jayde Daffin [employer]

## 2022-12-28 ENCOUNTER — Encounter (HOSPITAL_COMMUNITY): Payer: Self-pay | Admitting: Cardiology

## 2022-12-28 ENCOUNTER — Telehealth: Payer: Self-pay

## 2022-12-28 ENCOUNTER — Ambulatory Visit (HOSPITAL_COMMUNITY): Payer: Medicare HMO

## 2022-12-28 DIAGNOSIS — I4819 Other persistent atrial fibrillation: Secondary | ICD-10-CM | POA: Diagnosis not present

## 2022-12-28 DIAGNOSIS — I429 Cardiomyopathy, unspecified: Secondary | ICD-10-CM | POA: Diagnosis not present

## 2022-12-28 DIAGNOSIS — I2510 Atherosclerotic heart disease of native coronary artery without angina pectoris: Secondary | ICD-10-CM | POA: Diagnosis not present

## 2022-12-28 DIAGNOSIS — I428 Other cardiomyopathies: Secondary | ICD-10-CM | POA: Diagnosis not present

## 2022-12-28 DIAGNOSIS — I471 Supraventricular tachycardia, unspecified: Secondary | ICD-10-CM | POA: Diagnosis not present

## 2022-12-28 LAB — GLUCOSE, CAPILLARY: Glucose-Capillary: 119 mg/dL — ABNORMAL HIGH (ref 70–99)

## 2022-12-28 MED ORDER — APIXABAN 5 MG PO TABS: 5.0000 mg | ORAL_TABLET | Freq: Two times a day (BID) | ORAL | Status: DC

## 2022-12-28 NOTE — Plan of Care (Signed)
  Problem: Education: Goal: Knowledge of cardiac device and self-care will improve Outcome: Adequate for Discharge Goal: Ability to safely manage health related needs after discharge will improve Outcome: Adequate for Discharge Goal: Individualized Educational Video(s) Outcome: Adequate for Discharge   Problem: Cardiac: Goal: Ability to achieve and maintain adequate cardiopulmonary perfusion will improve Outcome: Adequate for Discharge   Problem: Education: Goal: Knowledge of General Education information will improve Description: Including pain rating scale, medication(s)/side effects and non-pharmacologic comfort measures Outcome: Adequate for Discharge   Problem: Health Behavior/Discharge Planning: Goal: Ability to manage health-related needs will improve Outcome: Adequate for Discharge   Problem: Clinical Measurements: Goal: Ability to maintain clinical measurements within normal limits will improve Outcome: Adequate for Discharge Goal: Will remain free from infection Outcome: Adequate for Discharge Goal: Diagnostic test results will improve Outcome: Adequate for Discharge Goal: Respiratory complications will improve Outcome: Adequate for Discharge Goal: Cardiovascular complication will be avoided Outcome: Adequate for Discharge   Problem: Activity: Goal: Risk for activity intolerance will decrease Outcome: Adequate for Discharge   Problem: Nutrition: Goal: Adequate nutrition will be maintained Outcome: Adequate for Discharge   Problem: Coping: Goal: Level of anxiety will decrease Outcome: Adequate for Discharge   Problem: Elimination: Goal: Will not experience complications related to bowel motility Outcome: Adequate for Discharge Goal: Will not experience complications related to urinary retention Outcome: Adequate for Discharge   Problem: Pain Management: Goal: General experience of comfort will improve Outcome: Adequate for Discharge   Problem:  Safety: Goal: Ability to remain free from injury will improve Outcome: Adequate for Discharge   Problem: Skin Integrity: Goal: Risk for impaired skin integrity will decrease Outcome: Adequate for Discharge   Problem: Education: Goal: Ability to describe self-care measures that may prevent or decrease complications (Diabetes Survival Skills Education) will improve Outcome: Adequate for Discharge Goal: Individualized Educational Video(s) Outcome: Adequate for Discharge   Problem: Coping: Goal: Ability to adjust to condition or change in health will improve Outcome: Adequate for Discharge   Problem: Fluid Volume: Goal: Ability to maintain a balanced intake and output will improve Outcome: Adequate for Discharge   Problem: Health Behavior/Discharge Planning: Goal: Ability to identify and utilize available resources and services will improve Outcome: Adequate for Discharge Goal: Ability to manage health-related needs will improve Outcome: Adequate for Discharge   Problem: Metabolic: Goal: Ability to maintain appropriate glucose levels will improve Outcome: Adequate for Discharge   Problem: Nutritional: Goal: Maintenance of adequate nutrition will improve Outcome: Adequate for Discharge Goal: Progress toward achieving an optimal weight will improve Outcome: Adequate for Discharge   Problem: Skin Integrity: Goal: Risk for impaired skin integrity will decrease Outcome: Adequate for Discharge   Problem: Tissue Perfusion: Goal: Adequacy of tissue perfusion will improve Outcome: Adequate for Discharge   Problem: Education: Goal: Understanding of disease, treatment, and recovery process will improve Outcome: Adequate for Discharge   Problem: Activity: Goal: Ability to return to baseline activity level will improve Outcome: Adequate for Discharge   Problem: Cardiac: Goal: Ability to maintain adequate cardiovascular perfusion will improve Outcome: Adequate for  Discharge Goal: Vascular access site(s) Level 0-1 will be maintained Outcome: Adequate for Discharge   Problem: Health Behavior/ Discharge Planning: Goal: Ability to safely manage health related needs after discharge Outcome: Adequate for Discharge

## 2022-12-28 NOTE — Discharge Instructions (Signed)
Cardiac Ablation, Care After  This sheet gives you information about how to care for yourself after your procedure. Your health care provider may also give you more specific instructions. If you have problems or questions, contact your health care provider. What can I expect after the procedure? After the procedure, it is common to have: Bruising around your puncture site. Tenderness around your puncture site. Skipped heartbeats. If you had an atrial fibrillation ablation, you may have atrial fibrillation during the first several months after your procedure.  Tiredness (fatigue).  Follow these instructions at home: Puncture site care  Follow instructions from your health care provider about how to take care of your puncture site. Make sure you: If present, leave stitches (sutures), skin glue, or adhesive strips in place. These skin closures may need to stay in place for up to 2 weeks. If adhesive strip edges start to loosen and curl up, you may trim the loose edges. Do not remove adhesive strips completely unless your health care provider tells you to do that. If a large square bandage is present, this may be removed 24 hours after surgery.  Check your puncture site every day for signs of infection. Check for: Redness, swelling, or pain. Fluid or blood. If your puncture site starts to bleed, lie down on your back, apply firm pressure to the area, and contact your health care provider. Warmth. Pus or a bad smell. A pea or small marble sized lump at the site is normal and can take up to three months to resolve.  Driving Do not drive for at least 4 days after your procedure or however long your health care provider recommends. (Do not resume driving if you have previously been instructed not to drive for other health reasons.) Do not drive or use heavy machinery while taking prescription pain medicine. Activity Avoid activities that take a lot of effort for at least 7 days after your  procedure. Do not lift anything that is heavier than 5 lb (4.5 kg) for one week.  No sexual activity for 1 week.  Return to your normal activities as told by your health care provider. Ask your health care provider what activities are safe for you. General instructions Take over-the-counter and prescription medicines only as told by your health care provider. Do not use any products that contain nicotine or tobacco, such as cigarettes and e-cigarettes. If you need help quitting, ask your health care provider. You may shower after 24 hours, but Do not take baths, swim, or use a hot tub for 1 week.  Do not drink alcohol for 24 hours after your procedure. Keep all follow-up visits as told by your health care provider. This is important. Contact a health care provider if: You have redness, mild swelling, or pain around your puncture site. You have fluid or blood coming from your puncture site that stops after applying firm pressure to the area. Your puncture site feels warm to the touch. You have pus or a bad smell coming from your puncture site. You have a fever. You have chest pain or discomfort that spreads to your neck, jaw, or arm. You have chest pain that is worse with lying on your back or taking a deep breath. You are sweating a lot. You feel nauseous. You have a fast or irregular heartbeat. You have shortness of breath. You are dizzy or light-headed and feel the need to lie down. You have pain or numbness in the arm or leg closest to your puncture  site. Get help right away if: Your puncture site suddenly swells. Your puncture site is bleeding and the bleeding does not stop after applying firm pressure to the area. These symptoms may represent a serious problem that is an emergency. Do not wait to see if the symptoms will go away. Get medical help right away. Call your local emergency services (911 in the [place] Do not drive yourself to the hospital. Summary After the procedure, it  is normal to have bruising and tenderness at the puncture site in your groin, neck, or forearm. Check your puncture site every day for signs of infection. Get help right away if your puncture site is bleeding and the bleeding does not stop after applying firm pressure to the area. This is a medical emergency. This information is not intended to replace advice given to you by your health care provider. Make sure you discuss any questions you have with your health care provider.   After Your Pacemaker   You have a [employer] Pacemaker  ACTIVITY Do not lift your arm above shoulder height for 1 week after your procedure. After 7 days, you may progress as below.  You should remove your sling 24 hours after your procedure, unless otherwise instructed by your provider.     Tuesday January 04, 2023  Wednesday January 05, 2023 Thursday January 06, 2023 Friday January 07, 2023   Do not lift, push, pull, or carry anything over 10 pounds with the affected arm until 6 weeks (Tuesday February 08, 2023 ) after your procedure.   You may drive AFTER your wound check, unless you have been told otherwise by your provider.   Ask your healthcare provider when you can go back to work   INCISION/Dressing Resume Eliquis THURSDAY 12/5 with the evening dose   Do not remove steri-strips or glue as below.   Monitor your Pacemaker site for redness, swelling, and drainage. Call the device clinic at [phone] if you experience these symptoms or fever/chills.  If your incision is sealed with Steri-strips or staples, you may shower 7 days after your procedure or when told by your provider. Do not remove the steri-strips or let the shower hit directly on your site. You may wash around your site with soap and water.    If you were discharged in a sling, please do not wear this during the day more than 48 hours after your surgery unless otherwise instructed. This may increase the risk of stiffness and soreness in  your shoulder.   Avoid lotions, ointments, or perfumes over your incision until it is well-healed.  You may use a hot tub or a pool AFTER your wound check appointment if the incision is completely closed.  Pacemaker Alerts:  Some alerts are vibratory and others beep. These are NOT emergencies. Please call our office to let us know. If this occurs at night or on weekends, it can wait until the next business day. Send a remote transmission.  If your device is capable of reading fluid status (for heart failure), you will be offered monthly monitoring to review this with you.   DEVICE MANAGEMENT Remote monitoring is used to monitor your pacemaker from home. This monitoring is scheduled every 91 days by our office. It allows [place] to keep an eye on the functioning of your device to ensure it is working properly. You will routinely see your Electrophysiologist annually (more often if necessary).   You should receive your ID card for your new device in 4-8 weeks.  Keep this card with you at all times once received. Consider wearing a medical alert bracelet or necklace.  Your Pacemaker may be MRI compatible. This will be discussed at your next office visit/wound check.  You should avoid contact with strong electric or magnetic fields.   Do not use amateur (ham) radio equipment or electric (arc) welding torches. MP3 player headphones with magnets should not be used. Some devices are safe to use if held at least 12 inches (30 cm) from your Pacemaker. These include power tools, lawn mowers, and speakers. If you are unsure if something is safe to use, ask your health care provider.  When using your cell phone, hold it to the ear that is on the opposite side from the Pacemaker. Do not leave your cell phone in a pocket over the Pacemaker.  You may safely use electric blankets, heating pads, computers, and microwave ovens.  Call the office right away if: You have chest pain. You feel more short of breath than  you have felt before. You feel more light-headed than you have felt before. Your incision starts to open up.  This information is not intended to replace advice given to you by your health care provider. Make sure you discuss any questions you have with your health care provider.

## 2022-12-28 NOTE — Telephone Encounter (Signed)
Patient transmission was received without a phone call. Pt may have questions about wound care.

## 2022-12-28 NOTE — Discharge Summary (Addendum)
ELECTROPHYSIOLOGY PROCEDURE DISCHARGE SUMMARY    Patient ID: Steve Anthony,  MRN: [id number], DOB/AGE: 08-27-1950 72 y.o.  Admit date: 12/27/2022 Discharge date: 12/28/2022  Primary Care Physician: Gordan Payment., MD  Primary Cardiologist: None  Electrophysiologist: Dr. Elberta Fortis   Primary Discharge Diagnosis:  Atrial fibrillation w RVR  Secondary Discharge Diagnosis:  CAD SVT   Allergies  Allergen Reactions   Penicillins Hives and Shortness Of Breath    Did it involve swelling of the face/tongue/throat, SOB, or low BP? Yes Did it involve sudden or severe rash/hives, skin peeling, or any reaction on the inside of your mouth or nose? No Did you need to seek medical attention at a hospital or doctor's office? Yes (emergency room) When did it last happen?  1974 If all above answers are "NO", may proceed with cephalosporin use.       Procedures This Admission:  1.  Implantation of a Abbott CRT PPM on 12/27/2022 by Dr. Elberta Fortis. The patient received a Abbott Quadra Allure MP [id number]  with a Abbott Ultipace 1231-65 right ventricular lead, and a Abbott Quartet 1458Q left ventricular lead.  There were no immediate post procedure complications.   2. EP study and AV nodal ablation on 12/27/2022 by Dr. Elberta Fortis. There were no immediate complications  3.  CXR on 12/28/2022 demonstrated no pneumothorax status post device implantation.       Brief HPI: Steve Anthony is a 72 y.o. male was referred to electrophysiology in the outpatient setting for  Persistent AFib w RVR. He failed dofetilide and had nausea and weight loss on amiodarone. Thus, rate-control only strategy was recommended, which has not been successful. Past medical history includes CAD s/p PCI, OSA, COPD, T2DM.   Risks, benefits, and alternatives to  PPM implantation and AVJ ablation were reviewed with the patient who wished to proceed.   Hospital Course:  The patient was admitted and underwent implantation of a Abbott  CRT-P with subsequent AV node ablation with details as outlined above.  He was monitored on telemetry overnight which demonstrated appropriate pacing.  Left chest was without hematoma. He did have some ecchymosis.  The device was interrogated and found to be functioning normally.  CXR was obtained and demonstrated no pneumothorax status post device implantation.  Wound care, arm mobility, and restrictions were reviewed with the patient.  The patient was examined and considered stable for discharge to home.    Anticoagulation resumption This patient should resume their Eliquis on Thursday December 30, 2022 with PM dose.    Physical Exam: Vitals:   12/28/22 0012 12/28/22 0333 12/28/22 0748 12/28/22 0754  BP: (!) 115/90 107/80  (!) 116/92  Pulse: 91 90  91  Resp: 15 18  16   Temp: 98.3 F (36.8 C) 98.6 F (37 C)  97.9 F (36.6 C)  TempSrc: Oral Oral  Oral  SpO2: 95% 90% 94% 97%  Weight:      Height:        GEN- NAD. A&O x 3.  HEENT: Normocephalic, atraumatic Lungs- CTAB, Normal effort.  Heart- RRR, No M/G/R.  GI- Soft, NT, ND.  Extremities- No clubbing, cyanosis, or edema;  Skin- warm and dry, no rash or lesion, left chest without hematoma. Outer bandage removed, steri-strips remain in place  Discharge Medications:  Allergies as of 12/28/2022       Reactions   Penicillins Hives, Shortness Of Breath   Did it involve swelling of the face/tongue/throat, SOB, or low BP? Yes Did it  involve sudden or severe rash/hives, skin peeling, or any reaction on the inside of your mouth or nose? No Did you need to seek medical attention at a hospital or doctor's office? Yes (emergency room) When did it last happen?  1974 If all above answers are "NO", may proceed with cephalosporin use.        Medication List     STOP taking these medications    hydrocortisone 25 MG suppository Commonly known as: ANUSOL-HC       TAKE these medications    acetaminophen 500 MG tablet Commonly known  as: TYLENOL Take 1,000 mg by mouth every 6 (six) hours as needed for mild pain, fever or headache.   albuterol 108 (90 Base) MCG/ACT inhaler Commonly known as: VENTOLIN HFA Inhale 1-2 puffs into the lungs as needed for wheezing or shortness of breath.   apixaban 5 MG Tabs tablet Commonly known as: Eliquis Take 1 tablet (5 mg total) by mouth 2 (two) times daily. Start taking on: December 30, 2022 What changed:  how much to take These instructions start on December 30, 2022. If you are unsure what to do until then, ask your doctor or other care provider.   baclofen 10 MG tablet Commonly known as: LIORESAL Take 10 mg by mouth every 8 (eight) hours as needed for muscle spasms.   Basaglar KwikPen 100 UNIT/ML Inject 25-35 Units into the skin daily. Pt states that if his blood sugar is >130, he takes 35 units and if it is <130, he takes 25 units   Breztri Aerosphere 160-9-4.8 MCG/ACT Aero Generic drug: Budeson-Glycopyrrol-Formoterol Inhale 1 puff into the lungs 2 (two) times daily.   docusate sodium 100 MG capsule Commonly known as: COLACE Take 1 capsule (100 mg total) by mouth 2 (two) times daily as needed for mild constipation. What changed: when to take this   escitalopram 10 MG tablet Commonly known as: LEXAPRO Take 10 mg by mouth daily.   fenofibrate 160 MG tablet Take 160 mg by mouth daily.   ferrous sulfate 325 (65 FE) MG tablet Take 325 mg by mouth daily with breakfast.   furosemide 20 MG tablet Commonly known as: LASIX Take 40 mg by mouth daily as needed for fluid or edema.   gabapentin 300 MG capsule Commonly known as: NEURONTIN Take 1 capsule (300 mg total) by mouth 3 (three) times daily.   Insulin Pen Needle 29G X Misc 1 Device by Does not apply route 3 (three) times daily as needed (for use with insulin pen).   Jardiance 25 MG Tabs tablet Generic drug: empagliflozin Take 25 mg by mouth daily.   magnesium oxide 400 MG tablet Commonly known as:  MAG-OX Take 1 tablet (400 mg total) by mouth daily.   Melatonin 10 MG Caps Take 20 mg by mouth at bedtime.   metFORMIN 1000 MG tablet Commonly known as: GLUCOPHAGE Take 1,000 mg by mouth 2 (two) times daily.   Metoprolol Tartrate 75 MG Tabs Take 1 tablet (75 mg total) by mouth 2 (two) times daily.   nitroGLYCERIN 0.4 MG SL tablet Commonly known as: NITROSTAT Place 0.4 mg under the tongue every 5 (five) minutes as needed for chest pain.   ondansetron 4 MG tablet Commonly known as: ZOFRAN Take 4 mg by mouth every 8 (eight) hours as needed for nausea or vomiting.   Oxycodone HCl 10 MG Tabs Take 1 tablet by mouth daily as needed (For pain). Take 1 tablet every 4-6 hours as needed  OXYGEN Inhale 2-4 L into the lungs continuous.   pantoprazole 40 MG tablet Commonly known as: PROTONIX Take 1 tablet (40 mg total) by mouth 2 (two) times daily.   PreserVision AREDS Tabs Take 1 tablet by mouth 2 (two) times daily.   tamsulosin 0.4 MG Caps capsule Commonly known as: FLOMAX Take 0.4 mg by mouth daily.   VITAMIN B-12 IJ Inject 1,000 mcg as directed every 30 (thirty) days.   zolpidem 5 MG tablet Commonly known as: Ambien Take 0.5 tablets (2.5 mg total) by mouth at bedtime as needed for sleep. What changed:  how much to take when to take this        Disposition:  Home with usual follow up as in AVS   Duration of Discharge Encounter: Greater than 30 minutes including physician time.  Signed, Sherie Don, NP  12/28/2022 8:32 AM   I have seen and examined this patient with Sherie Don.  Agree with above, note added to reflect my findings.  On exam, RRR,no murmurs, lungs clear.  She is now status post Abbott CRT-P with AV node ablation.  Device functioning appropriately.  Sensing, threshold, impedance within normal limits as expected post implant.  Chest x-ray and interrogation without issue.  Plan for discharge today with follow-up in device clinic.  Tej Murdaugh M. Naijah Lacek  MD 12/28/2022 12:59 PM

## 2022-12-29 ENCOUNTER — Encounter: Payer: Self-pay | Admitting: Gastroenterology

## 2022-12-29 ENCOUNTER — Ambulatory Visit: Payer: Medicare HMO | Admitting: Gastroenterology

## 2022-12-29 VITALS — BP 100/60 | HR 91 | Ht 72.0 in | Wt 200.0 lb

## 2022-12-29 DIAGNOSIS — K7469 Other cirrhosis of liver: Secondary | ICD-10-CM | POA: Diagnosis not present

## 2022-12-29 DIAGNOSIS — Z8719 Personal history of other diseases of the digestive system: Secondary | ICD-10-CM

## 2022-12-29 DIAGNOSIS — R195 Other fecal abnormalities: Secondary | ICD-10-CM

## 2022-12-29 DIAGNOSIS — K648 Other hemorrhoids: Secondary | ICD-10-CM

## 2022-12-29 DIAGNOSIS — K649 Unspecified hemorrhoids: Secondary | ICD-10-CM

## 2022-12-29 NOTE — Telephone Encounter (Signed)
Has somone called about the same day discharge? I do not see a phone note.

## 2022-12-29 NOTE — Progress Notes (Signed)
Chief Complaint: FU   Referring Provider:  Gordan Payment., MD      ASSESSMENT AND PLAN;   #1.  H+ brown stools (on eliquis).  I think it is because of hemorrhoids. Hb stable. Neg NCCT Abdo/pelvis 12/01/2022.  Not a candidate for any endoscopic procedures currently as he is s/p pacemaker/ICD placement 12/27/2022 and on mandatory Eliquis.  #2. H/O acute idiopathic recurrent pancreatitis with pancreatic tail pseudocyst s/p distal pancreatectomy with debridement of pseudocyst w/t splenectomy (Dr. Donell Beers in May 2021)  #3. Cryptogenic liver cirrhosis (likely d/t NASH, Dx 02/2018 on CT) with portal hypertension- moderate splenomegaly (on CT), mild thrombocytopenia, portal venous collaterals. Gd 1 eso varices on EGD (06/2018). Neg WU for etiology 2020. Immune to A but not to B. No ascites or HE. Nl AFP.  #4. Multiple comorbid conditions including A. fib on Eliquis, COPD on O2, OSA, CKD3 (Cr 1.59 09/2022), CAD, dCHF, DM2, obesity, remote PE.   Plan:  -Continue Protonix 40 BID #180, 4RF -No NSAIDs -Continue low salt diet.  Avoid fatty fried foods.  Small but more frequent meals. -Ideally needs EGD/colon but don't think he would be able to tolerate it @ this time.  Besides, he wants to hold off as well. If any active bleeding or significant drop in hemoglobin, stat ED. -Panc enzymes Creon (lip 36,000) 1 with each meal.  Samples given. -FU in 12 weeks. If still with problems, EGD/colon after cardio clearence at [facility] at that time.    HPI:    Steve Anthony is a 72 y.o. male  With A. fib on Eliquis, COPD on O2, OSA, CKD3 (Cr 1.59 09/2022), CAD, dCHF, DM2, obesity, remote PE. S/P pacemaker placement and ablation D/C from hospital yesterday (Dr Elberta Fortis)  No further episodes of rectal bleeding since the last visit. The patient was discharged from the hospital yesterday following the cardiac procedures. He is currently on Eliquis which cannot be stopped.  The patient reports no nausea/vomiting,  diarrhea, or constipation. However, he has been experiencing a lack of appetite, which is suspected to be due to the medications. He is currently taking Protonix twice a day, which is expected to help with the appetite.  Minimal chronic epigastric pain especially after eating.  He has been taking Tylenol for pain management and pancreatic enzymes with each meal to aid digestion. He has not been using the prescribed suppositories as there has been no further rectal bleeding.  CBC with Hb 13.5 (11/27), with baseline hemoglobin 13-14 (03/2022). Neg noncontrast CT Abdo/pelvis 12/01/2022 for any acute abnormalities.  The patient's blood work from November 27th showed that his blood is quite thin, and he has been bruising easily. He is also on Eliquis, which further thins the blood. The patient's most recent CT scan showed improvement in the pancreas.       Occ N/V, nothing new. Occ abdominal pain after eating with some bloating. Loose stools 5-6/day on magnesium/metformin.  Creon helped previously Told me he is having dark stools-at times black but taking iron tablets Hb 13 09/30/2022  I did a rectal exam today-boggy internal hemorrhoids, brown stool but heme positive  Does not want any invasive procedures currently by means of EGD/colon I also do not think he will be able to tolerate it currently  Last EGD at the time of EUS 06/2018 showing grade 1 esophageal varices, portal hypertensive gastropathy.  We had discussed changing metoprolol to coreg. Was not keen on doing it  Last colonoscopy 2009-fair prep, diminutive  hyperplastic colonic polyp with pancolonic diverticulosis, internal hemorrhoids.   No weight loss.  Daughter had fracture of ankle recently.     Wt Readings from Last 3 Encounters:  12/29/22 200 lb (90.7 kg)  12/27/22 210 lb (95.3 kg)  12/22/22 209 lb (94.8 kg)     Previous imaging studies :  CT AP 03/29/2019 1. Stable large pseudocyst 12.5x 9 cm in the tail of the pancreas.  No interval change. 2. Portal hypertension with extensive venous collateralization. 3. New small splenic infarction.  - NCCT scan 09/22/2017 -showed large 7 into 9 cm pancreatic pseudocyst in the tail of the pancreas. - CT Abdo/pelvis with IV contrast  11/09/2018, 05/10/2018:  1. Pancreatic tail pseudocyst .7.5 x 6.5 by 5.9 cm 2. Hepatic cirrhosis and mild steatosis. No evidence of hepatic neoplasm. 3. Stable splenomegaly. -MRCP 02/2018 ? Hemo psudocyst -EGD/EUS 07/17/2018: Grade 1 esophageal varices, portal hypertensive gastropathy, cystic 6.5x5.4 cm pancreatic tail pseudocyst with surrounding blood vessels.  Colon 11/2007 4 mm Colon polyp s/p polypectomy. Bx- neg Pancolonic div Int hoids Fair prep Rpt 10 yrs  NCCT Abdo/pelvis 12/01/2022 Narrative & Impression  CLINICAL DATA:  Acute nonlocalized abdominal pain, pancreatitis, cirrhosis   EXAM: CT ABDOMEN AND PELVIS WITHOUT CONTRAST   TECHNIQUE: Multidetector CT imaging of the abdomen and pelvis was performed following the standard protocol without IV contrast.   RADIATION DOSE REDUCTION: This exam was performed according to the departmental dose-optimization program which includes automated exposure control, adjustment of the mA and/or kV according to patient size and/or use of iterative reconstruction technique.   COMPARISON:  07/02/2022   FINDINGS: Lower chest: Small bilateral pleural effusions have developed with compressive atelectasis of the visualized lung bases. Moderate right coronary artery calcification.   Hepatobiliary: Mild hepatomegaly. No definite intrahepatic mass identified on this noncontrast examination. No intra or extrahepatic biliary ductal dilation. Status post cholecystectomy.   Pancreas: The pancreas is markedly atrophic with scattered punctate calcifications within the residual pancreatic parenchyma of the head and uncinate process in keeping with changes of chronic pancreatitis. No superimposed  acute peripancreatic inflammatory changes or fluid collections identified.   Spleen: Absent   Adrenals/Urinary Tract: The adrenal glands are unremarkable. The kidneys are normal in size and position. Simple cortical cysts are seen within the kidneys bilaterally for which no follow-up imaging is recommended. The kidneys are otherwise unremarkable. Bladder unremarkable.   Stomach/Bowel: Surgical changes of sigmoid colectomy are identified mild ascending and descending colonic diverticulosis present without superimposed focal inflammatory change. Mild circumferential bowel wall thickening involving the ascending colon may reflect changes of portal colopathy though a superimposed mild infectious or inflammatory colitis would be difficult to exclude. No evidence of obstruction. No free intraperitoneal gas or fluid.   Vascular/Lymphatic: Aortic atherosclerosis. No enlarged abdominal or pelvic lymph nodes.   Reproductive: Prostate is unremarkable.   Other: Diffuse subcutaneous edema noted within the body wall in keeping with changes of anasarca.   Musculoskeletal: Right total hip arthroplasty has been performed. L5-S1 lumbar fusion with instrumentation has been performed. No acute bone abnormality. No lytic or blastic bone lesion.   IMPRESSION: 1. Interval development of small bilateral pleural effusions with compressive atelectasis of the visualized lung bases. Diffuse subcutaneous edema within the body wall in keeping with changes of anasarca. 2. Moderate right coronary artery calcification. 3. Mild hepatomegaly. 4. Changes of chronic pancreatitis. No superimposed acute peripancreatic inflammatory changes or fluid collections identified. 5. Mild circumferential bowel wall thickening involving the ascending colon may reflect changes of portal  colopathy though a superimposed mild infectious or inflammatory colitis would be difficult to exclude. 6. Mild ascending and descending  colonic diverticulosis without superimposed focal inflammatory change.   Aortic Atherosclerosis (ICD10-I70.0).    SH- daughter is a Charity fundraiser, Interior and spatial designer of [place]  (April phone #[phone]) Past Medical History:  Diagnosis Date   Abnormal CT of the abdomen 05/27/2018   Abnormal MRI of abdomen 04/14/2018   Acute pancreatitis 02/20/2019   Anemia    Anxiety disorder 04/09/2015   Arthritis    Asthma    Atrial fibrillation (HCC) 11/21/2008   Qualifier: Diagnosis of   By: Ladona Ridgel, MD, Jerrell Mylar        CAD (coronary artery disease)    a. S/P PCI mid LAD (vision stent) 08/31/2004;  b. repeat cath 6/11 - no progression of CAD. c. Cath 10/2012 given moderate CAD on cardiac CT - no change from prior cath.   Centrilobular emphysema (HCC) 10/08/2022   Chest CT 08/04/2022     Chest pain 12/16/2016   CHEST PAIN UNSPECIFIED 11/21/2008   Qualifier: Diagnosis of   By: Remer Macho, EMT-P, Tara         Chronic respiratory failure with hypoxia (HCC) 02/24/2017   Cirrhosis of liver without ascites (HCC) 04/14/2018   CKD (chronic kidney disease), stage III (HCC) 02/20/2019   COPD (chronic obstructive pulmonary disease) (HCC)    COPD without exacerbation (HCC) 05/15/2009   Qualifier: Diagnosis of   By: Vassie Loll MD, Comer Locket.         COVID-19 virus infection    Cyst of nipple, right 11/03/2020   Degeneration of lumbar intervertebral disc 04/09/2015   Diverticulitis    Dyslipidemia    Essential hypertension 10/15/2008   Qualifier: Diagnosis of   By: Flonnie Overman         Gallstones    GERD 10/15/2008   Qualifier: Diagnosis of   By: Flonnie Overman         High risk medication use 04/09/2015   History of COVID-19 01/24/19 02/20/2019   History of kidney stones    History of pelvic fracture 01/01/2020   History of tattoo 08/23/2016   History of UTI 11/28/2019   Hypercoagulable state due to persistent atrial fibrillation (HCC) 03/26/2022   Hyperlipidemia 10/15/2008   Qualifier: Diagnosis of   By:  Flonnie Overman         Hypertension    Hyperthyroidism    HYPERTRIGLYCERIDEMIA 10/15/2008   Qualifier: Diagnosis of   By: Flonnie Overman         Hypotension 06/13/2019   Idiopathic acute pancreatitis 04/14/2018   Incisional hernia, without obstruction or gangrene 02/05/2016   Infected pancreatic pseudocyst 06/07/2019   Insomnia 04/09/2015   Iron deficiency anemia 05/03/2022   Kidney mass 07/06/2017   Following with urology. Chao.  Recommend follow up. Last seen 01/2018  Refuses to see again.   CT okay 04/2021     Long term (current) use of anticoagulants [Z79.01] 06/10/2015   Melanoma (HCC)    melanoma removed from neck    Mild episode of recurrent major depressive disorder (HCC) 08/27/2015   NEPHROLITHIASIS 10/15/2008   Qualifier: Diagnosis of   By: Flonnie Overman         OA (osteoarthritis) of hip 12/18/2014   On home oxygen therapy    "2L q hs" (11/08/2012),  06/04/19 pt. not on oxygen   OSA (obstructive sleep apnea)    a. cpap noncompliance- patient reports on 12/10/14- does not use CPAP  Palpitations    a. 10/2012 - s/p LINQ loop recorder to assess for arrhythmia.    Pancreatic cyst 04/14/2018   Pancreatic pseudocyst 06/07/2019   Pancreatitis    Paroxysmal A-fib (HCC) 11/21/2008   a. S/P PVI 2/11 and 9/11   Pelvic fracture (HCC) 12/31/2019   Persistent atrial fibrillation (HCC) 06/08/2022   PICC (peripherally inserted central catheter) in place    Pneumonia    Protein-calorie malnutrition, severe 06/12/2019   Pseudocyst of pancreas 03/06/2019   Pure hypercholesterolemia    S/P coronary artery stent placement    SHORTNESS OF BREATH 03/19/2009   Qualifier: Diagnosis of   By: Johney Frame MD, James         Sleep apnea 05/17/2009   Qualifier: Diagnosis of   By: Vassie Loll MD, Comer Locket.         SVT (supraventricular tachycardia) (HCC)    a. Atypical AVNRT of the slow pathway   Thoracic aortic aneurysm (HCC) 11/13/2020   Type II diabetes mellitus (HCC)    Unstable angina (HCC)  10/15/2008   Qualifier: Diagnosis of   By: Flonnie Overman          Past Surgical History:  Procedure Laterality Date   ATRIAL ABLATION SURGERY  2007   S/P slow pathway ablation for atypical AVNRT    AV NODE ABLATION N/A 12/27/2022   Procedure: AV NODE ABLATION;  Surgeon: Regan Lemming, MD;  Location: [facility] INVASIVE CV LAB;  Service: Cardiovascular;  Laterality: N/A;   BIOPSY  07/17/2018   Procedure: BIOPSY;  Surgeon: Meridee Score Netty Starring., MD;  Location: [facility] ENDOSCOPY;  Service: Gastroenterology;;   BIV PACEMAKER INSERTION CRT-P N/A 12/27/2022   Procedure: BIV PACEMAKER INSERTION CRT-P;  Surgeon: Regan Lemming, MD;  Location: [facility] INVASIVE CV LAB;  Service: Cardiovascular;  Laterality: N/A;   CARDIOVERSION N/A 04/13/2022   Procedure: CARDIOVERSION;  Surgeon: Chrystie Nose, MD;  Location: [facility] ENDOSCOPY;  Service: Cardiovascular;  Laterality: N/A;   CARDIOVERSION N/A 06/10/2022   Procedure: CARDIOVERSION;  Surgeon: Wendall Stade, MD;  Location: [facility] INVASIVE CV LAB;  Service: Cardiovascular;  Laterality: N/A;   CHOLECYSTECTOMY  ~ 2008   COLON RESECTION  02/2013    COLONOSCOPY  12/25/2007   Colonic polyp status post polypectomy. Internal hemorrhoids.    CORONARY ANGIOPLASTY WITH STENT PLACEMENT  08/30/2004   Vision; to LAD    CYSTOSCOPY W/ STONE MANIPULATION  ~ 1998   "once" (11/08/2012)   EP Study  2008   Negative EP study in [place]   ESOPHAGOGASTRODUODENOSCOPY (EGD) WITH PROPOFOL N/A 07/17/2018   Procedure: ESOPHAGOGASTRODUODENOSCOPY (EGD) WITH PROPOFOL;  Surgeon: Lemar Lofty., MD;  Location: [facility] ENDOSCOPY;  Service: Gastroenterology;  Laterality: N/A;   FRACTURE SURGERY     LEFT HEART CATHETERIZATION WITH CORONARY ANGIOGRAM N/A 11/10/2012   Procedure: LEFT HEART CATHETERIZATION WITH CORONARY ANGIOGRAM;  Surgeon: Kathleene Hazel, MD;  Location: [facility] CATH LAB;  Service: Cardiovascular;  Laterality: N/A;   LOOP RECORDER IMPLANT N/A 11/10/2012   Medtronic LinQ  implanted by Dr Johney Frame for afib management   LUMBAR DISC SURGERY  2000; 2002   POSTERIOR LUMBAR FUSION  2004   PROSTATE SURGERY  ~ 2009   PVI/ CTI ablation  02/25/2009 and 09/2009   S/P afib/ CTI ablation   SPLENECTOMY, TOTAL N/A 06/07/2019   Procedure: SPLENECTOMY;  Surgeon: Almond Lint, MD;  Location: [facility] OR;  Service: General;  Laterality: N/A;   TIBIA FRACTURE SURGERY Right 1990   "broke in 3 places; had rod put  in" (11/08/2012)   TIBIA HARDWARE REMOVAL Right ~ 1991   TONSILLECTOMY  1965   TOTAL HIP ARTHROPLASTY Right 12/18/2014   Procedure: RIGHT TOTAL HIP ARTHROPLASTY ANTERIOR APPROACH;  Surgeon: Ollen Gross, MD;  Location: [facility] ORS;  Service: Orthopedics;  Laterality: Right;   UPPER ESOPHAGEAL ENDOSCOPIC ULTRASOUND (EUS) N/A 07/17/2018   Procedure: UPPER ESOPHAGEAL ENDOSCOPIC ULTRASOUND (EUS);  Surgeon: Lemar Lofty., MD;  Location: [facility] ENDOSCOPY;  Service: Gastroenterology;  Laterality: N/A;    Family History  Problem Relation Age of Onset   Emphysema Mother    Heart attack Father        CVA   Hypertension Father    Diabetes Father    Heart disease Father        MI   Emphysema Maternal Grandfather        Smoker   Heart disease Paternal Grandfather    Colon cancer Neg Hx    Esophageal cancer Neg Hx    Inflammatory bowel disease Neg Hx    Liver disease Neg Hx    Pancreatic cancer Neg Hx    Rectal cancer Neg Hx    Stomach cancer Neg Hx     Social History   Tobacco Use   Smoking status: Former    Current packs/day: 0.00    Average packs/day: 3.0 packs/day for 38.0 years (114.0 ttl pk-yrs)    Types: Cigarettes    Start date: 03/26/1966    Quit date: 03/25/2004    Years since quitting: 18.7   Smokeless tobacco: Former    Types: Chew    Quit date: 04/25/2004   Tobacco comments:    Former smoker 03/26/22  Vaping Use   Vaping status: Never Used  Substance Use Topics   Alcohol use: Not Currently    Comment:  "quit drinking in 1989"   Drug use: No    Current  Outpatient Medications  Medication Sig Dispense Refill   acetaminophen (TYLENOL) 500 MG tablet Take 1,000 mg by mouth every 6 (six) hours as needed for mild pain, fever or headache.     albuterol (VENTOLIN HFA) 108 (90 Base) MCG/ACT inhaler Inhale 1-2 puffs into the lungs as needed for wheezing or shortness of breath.     [START ON 12/30/2022] apixaban (ELIQUIS) 5 MG TABS tablet Take 1 tablet (5 mg total) by mouth 2 (two) times daily.     baclofen (LIORESAL) 10 MG tablet Take 10 mg by mouth every 8 (eight) hours as needed for muscle spasms.     BREZTRI AEROSPHERE 160-9-4.8 MCG/ACT AERO Inhale 1 puff into the lungs 2 (two) times daily.     Cyanocobalamin (VITAMIN B-12 IJ) Inject 1,000 mcg as directed every 30 (thirty) days.     docusate sodium (COLACE) 100 MG capsule Take 1 capsule (100 mg total) by mouth 2 (two) times daily as needed for mild constipation. (Patient taking differently: Take 100 mg by mouth 2 (two) times daily.) 10 capsule 0   escitalopram (LEXAPRO) 10 MG tablet Take 10 mg by mouth daily.     fenofibrate 160 MG tablet Take 160 mg by mouth daily.     ferrous sulfate 325 (65 FE) MG tablet Take 325 mg by mouth daily with breakfast.     furosemide (LASIX) 20 MG tablet Take 40 mg by mouth daily as needed for fluid or edema.     gabapentin (NEURONTIN) 300 MG capsule Take 1 capsule (300 mg total) by mouth 3 (three) times daily. 90 capsule 0   Insulin Glargine (  BASAGLAR KWIKPEN) 100 UNIT/ML Inject 25-35 Units into the skin daily. Pt states that if his blood sugar is >130, he takes 35 units and if it is <130, he takes 25 units     Insulin Pen Needle 29G X MISC 1 Device by Does not apply route 3 (three) times daily as needed (for use with insulin pen). 100 each 0   JARDIANCE 25 MG TABS tablet Take 25 mg by mouth daily.     magnesium oxide (MAG-OX) 400 MG tablet Take 1 tablet (400 mg total) by mouth daily. 30 tablet 6   Melatonin 10 MG CAPS Take 20 mg by mouth at bedtime.     metFORMIN  (GLUCOPHAGE) 1000 MG tablet Take 1,000 mg by mouth 2 (two) times daily.     metoprolol tartrate 75 MG TABS Take 1 tablet (75 mg total) by mouth 2 (two) times daily. 180 tablet 3   Multiple Vitamins-Minerals (PRESERVISION AREDS) TABS Take 1 tablet by mouth 2 (two) times daily.     nitroGLYCERIN (NITROSTAT) 0.4 MG SL tablet Place 0.4 mg under the tongue every 5 (five) minutes as needed for chest pain.     ondansetron (ZOFRAN) 4 MG tablet Take 4 mg by mouth every 8 (eight) hours as needed for nausea or vomiting.     Oxycodone HCl 10 MG TABS Take 1 tablet by mouth daily as needed (For pain). Take 1 tablet every 4-6 hours as needed     OXYGEN Inhale 2-4 L into the lungs continuous.     pantoprazole (PROTONIX) 40 MG tablet Take 1 tablet (40 mg total) by mouth 2 (two) times daily. 180 tablet 4   Tamsulosin HCl (FLOMAX) 0.4 MG CAPS Take 0.4 mg by mouth daily.     zolpidem (AMBIEN) 5 MG tablet Take 0.5 tablets (2.5 mg total) by mouth at bedtime as needed for sleep. (Patient taking differently: Take 5 mg by mouth at bedtime.) 30 tablet 0   No current facility-administered medications for this visit.    Allergies  Allergen Reactions   Penicillins Hives and Shortness Of Breath    Did it involve swelling of the face/tongue/throat, SOB, or low BP? Yes Did it involve sudden or severe rash/hives, skin peeling, or any reaction on the inside of your mouth or nose? No Did you need to seek medical attention at a hospital or doctor's office? Yes (emergency room) When did it last happen?  1974 If all above answers are "NO", may proceed with cephalosporin use.      Review of Systems:  neg     Physical Exam:    BP 100/60 (BP Location: Right Arm, Patient Position: Sitting, Cuff Size: Normal)   Pulse 91   Ht 6' (1.829 m)   Wt 200 lb (90.7 kg)   SpO2 97%   BMI 27.12 kg/m  Filed Weights   12/29/22 0900  Weight: 200 lb (90.7 kg)   Constitutional:  Well-developed, in no acute distress. Psychiatric:  Normal mood and affect. Behavior is normal. Abdominal exam: Mild generalized abdominal tenderness.  Well-healed surgical scar. Lungs: Bilateral decreased breath sounds. Rectal examination was performed in presence of Brooke-boggy internal hemorrhoids, brown stool, heme positive.  Data Reviewed: I have personally reviewed following labs and imaging studies     Latest Ref Rng & Units 12/22/2022   10:37 AM 11/29/2022   10:11 AM 04/09/2022    8:26 AM  CBC  WBC 4.0 - 10.5 K/uL 6.0  6.3  9.1   Hemoglobin 13.0 -  17.0 g/dL 16.1  09.6  04.5   Hematocrit 39.0 - 52.0 % 42.3  44.4  42.5   Platelets 150.0 - 400.0 K/uL 327.0  253.0  390      CBC:    Latest Ref Rng & Units 12/22/2022   10:37 AM 11/29/2022   10:11 AM 04/09/2022    8:26 AM  CBC  WBC 4.0 - 10.5 K/uL 6.0  6.3  9.1   Hemoglobin 13.0 - 17.0 g/dL 40.9  81.1  91.4   Hematocrit 39.0 - 52.0 % 42.3  44.4  42.5   Platelets 150.0 - 400.0 K/uL 327.0  253.0  390     CMP:    Latest Ref Rng & Units 11/29/2022   10:11 AM 06/11/2022   11:05 AM 06/11/2022    3:36 AM  CMP  Glucose 70 - 99 mg/dL 782   956   BUN 6 - 23 mg/dL 20   12   Creatinine 2.13 - 1.50 mg/dL 0.86   5.78   Sodium 469 - 145 mEq/L 139   136   Potassium 3.5 - 5.1 mEq/L 4.9   4.0   Chloride 96 - 112 mEq/L 103   104   CO2 19 - 32 mEq/L 25   24   Calcium 8.4 - 10.5 mg/dL 8.6   8.8   Total Protein 6.0 - 8.3 g/dL 6.1  6.7    Total Bilirubin 0.2 - 1.2 mg/dL 0.9  0.9    Alkaline Phos 39 - 117 U/L 22  32    AST 0 - 37 U/L 14  25    ALT 0 - 53 U/L 10  16      Edman Circle, MD 12/29/2022, 9:17 AM  Cc: Gordan Payment., MD

## 2022-12-29 NOTE — Patient Instructions (Addendum)
_______________________________________________________  If your blood pressure at your visit was 140/90 or greater, please contact your primary care physician to follow up on this.  _______________________________________________________  If you are age 71 or older, your body mass index should be between 23-30. Your Body mass index is 27.12 kg/m. If this is out of the aforementioned range listed, please consider follow up with your Primary Care Provider.  If you are age 77 or younger, your body mass index should be between 19-25. Your Body mass index is 27.12 kg/m. If this is out of the aformentioned range listed, please consider follow up with your Primary Care Provider.   ________________________________________________________  The [place] providers would like to encourage you to use [facility] to communicate with providers for non-urgent requests or questions.  Due to long hold times on the telephone, sending your provider a message by [facility] may be a faster and more efficient way to get a response.  Please allow 48 business hours for a response.  Please remember that this is for non-urgent requests.  _______________________________________________________  We have given you samples of the following medication to take: Creon 1 with each meal  Please follow up in 3 months. Give [place] a call at [phone] to schedule an appointment.  It was a pleasure to see you today!  Thank you for trusting me with your gastrointestinal care!

## 2022-12-30 MED FILL — Lidocaine HCl Local Inj 1%: INTRAMUSCULAR | Qty: 60 | Status: AC

## 2022-12-30 NOTE — Telephone Encounter (Signed)
Follow-up after same day discharge: Implant date: 12/27/2022 MD: Loman Brooklyn, MD Device: PPM [id number]  Location: [facility] CHEST    Wound check visit: YES  90 day MD follow-up: YES   Remote Transmission received:YES  Dressing/sling removed: YES  Confirm OAC restart on:   Please continue to monitor your cardiac device site for redness, swelling, and drainage. Call the device clinic at [phone] if you experience these symptoms, fever/chills, or have questions about your device.   Remote monitoring is used to monitor your cardiac device from home. This monitoring is scheduled every 91 days by our office. It allows [place] to keep an eye on the functioning of your device to ensure it is working properly.

## 2023-01-13 ENCOUNTER — Telehealth: Payer: Self-pay | Admitting: Cardiology

## 2023-01-13 ENCOUNTER — Other Ambulatory Visit: Payer: Self-pay

## 2023-01-13 ENCOUNTER — Observation Stay (HOSPITAL_COMMUNITY): Payer: Medicare HMO

## 2023-01-13 ENCOUNTER — Observation Stay (HOSPITAL_COMMUNITY)
Admission: EM | Admit: 2023-01-13 | Discharge: 2023-01-14 | Disposition: A | Discharge status: Home / Self Care | Payer: Medicare HMO | Attending: Internal Medicine | Admitting: Internal Medicine

## 2023-01-13 ENCOUNTER — Encounter (HOSPITAL_COMMUNITY): Payer: Self-pay | Admitting: Emergency Medicine

## 2023-01-13 ENCOUNTER — Ambulatory Visit: Payer: Medicare HMO | Attending: Cardiology

## 2023-01-13 ENCOUNTER — Emergency Department (HOSPITAL_COMMUNITY): Payer: Medicare HMO

## 2023-01-13 DIAGNOSIS — Z85820 Personal history of malignant melanoma of skin: Secondary | ICD-10-CM | POA: Insufficient documentation

## 2023-01-13 DIAGNOSIS — E1122 Type 2 diabetes mellitus with diabetic chronic kidney disease: Secondary | ICD-10-CM | POA: Diagnosis not present

## 2023-01-13 DIAGNOSIS — J449 Chronic obstructive pulmonary disease, unspecified: Secondary | ICD-10-CM | POA: Insufficient documentation

## 2023-01-13 DIAGNOSIS — Z9981 Dependence on supplemental oxygen: Secondary | ICD-10-CM | POA: Diagnosis not present

## 2023-01-13 DIAGNOSIS — I480 Paroxysmal atrial fibrillation: Secondary | ICD-10-CM

## 2023-01-13 DIAGNOSIS — E860 Dehydration: Secondary | ICD-10-CM | POA: Diagnosis not present

## 2023-01-13 DIAGNOSIS — E1110 Type 2 diabetes mellitus with ketoacidosis without coma: Secondary | ICD-10-CM | POA: Diagnosis not present

## 2023-01-13 DIAGNOSIS — I2510 Atherosclerotic heart disease of native coronary artery without angina pectoris: Secondary | ICD-10-CM | POA: Diagnosis not present

## 2023-01-13 DIAGNOSIS — K859 Acute pancreatitis without necrosis or infection, unspecified: Secondary | ICD-10-CM | POA: Diagnosis not present

## 2023-01-13 DIAGNOSIS — Z7901 Long term (current) use of anticoagulants: Secondary | ICD-10-CM | POA: Insufficient documentation

## 2023-01-13 DIAGNOSIS — N1831 Chronic kidney disease, stage 3a: Secondary | ICD-10-CM | POA: Diagnosis present

## 2023-01-13 DIAGNOSIS — Z79899 Other long term (current) drug therapy: Secondary | ICD-10-CM | POA: Diagnosis not present

## 2023-01-13 DIAGNOSIS — Z955 Presence of coronary angioplasty implant and graft: Secondary | ICD-10-CM | POA: Insufficient documentation

## 2023-01-13 DIAGNOSIS — Z950 Presence of cardiac pacemaker: Secondary | ICD-10-CM | POA: Diagnosis not present

## 2023-01-13 DIAGNOSIS — E785 Hyperlipidemia, unspecified: Secondary | ICD-10-CM | POA: Diagnosis present

## 2023-01-13 DIAGNOSIS — E782 Mixed hyperlipidemia: Secondary | ICD-10-CM

## 2023-01-13 DIAGNOSIS — E119 Type 2 diabetes mellitus without complications: Secondary | ICD-10-CM

## 2023-01-13 DIAGNOSIS — E872 Acidosis, unspecified: Secondary | ICD-10-CM | POA: Diagnosis present

## 2023-01-13 DIAGNOSIS — J9611 Chronic respiratory failure with hypoxia: Secondary | ICD-10-CM | POA: Diagnosis not present

## 2023-01-13 DIAGNOSIS — Z96641 Presence of right artificial hip joint: Secondary | ICD-10-CM | POA: Insufficient documentation

## 2023-01-13 DIAGNOSIS — Z794 Long term (current) use of insulin: Secondary | ICD-10-CM | POA: Insufficient documentation

## 2023-01-13 DIAGNOSIS — I130 Hypertensive heart and chronic kidney disease with heart failure and stage 1 through stage 4 chronic kidney disease, or unspecified chronic kidney disease: Secondary | ICD-10-CM | POA: Diagnosis not present

## 2023-01-13 DIAGNOSIS — I5022 Chronic systolic (congestive) heart failure: Secondary | ICD-10-CM | POA: Diagnosis not present

## 2023-01-13 DIAGNOSIS — Z87891 Personal history of nicotine dependence: Secondary | ICD-10-CM | POA: Insufficient documentation

## 2023-01-13 DIAGNOSIS — K861 Other chronic pancreatitis: Secondary | ICD-10-CM | POA: Diagnosis not present

## 2023-01-13 DIAGNOSIS — E875 Hyperkalemia: Secondary | ICD-10-CM | POA: Diagnosis not present

## 2023-01-13 DIAGNOSIS — E861 Hypovolemia: Secondary | ICD-10-CM | POA: Diagnosis not present

## 2023-01-13 DIAGNOSIS — Z8709 Personal history of other diseases of the respiratory system: Secondary | ICD-10-CM

## 2023-01-13 DIAGNOSIS — Z8616 Personal history of COVID-19: Secondary | ICD-10-CM | POA: Insufficient documentation

## 2023-01-13 DIAGNOSIS — E1142 Type 2 diabetes mellitus with diabetic polyneuropathy: Secondary | ICD-10-CM

## 2023-01-13 DIAGNOSIS — I959 Hypotension, unspecified: Secondary | ICD-10-CM | POA: Diagnosis present

## 2023-01-13 LAB — URINALYSIS, W/ REFLEX TO CULTURE (INFECTION SUSPECTED)
Bilirubin Urine: NEGATIVE
Glucose, UA: >=500 mg/dL — AB
Hgb urine dipstick: NEGATIVE
Ketones, ur: 5 mg/dL — AB
Leukocytes,Ua: NEGATIVE
Nitrite: NEGATIVE
Protein, ur: NEGATIVE mg/dL
Specific Gravity, Urine: 1.027 (ref 1.005–1.030)
pH: 5 (ref 5.0–8.0)

## 2023-01-13 LAB — CUP PACEART INCLINIC DEVICE CHECK
Date Time Interrogation Session: 20241219124000
Implantable Lead Connection Status: 753985
Implantable Lead Connection Status: 753985
Implantable Lead Implant Date: 20241202
Implantable Lead Implant Date: 20241202
Implantable Lead Location: 753858
Implantable Lead Location: 753860
Implantable Pulse Generator Implant Date: 20241202
Pulse Gen Model: 3562
Pulse Gen Serial Number: 8218905

## 2023-01-13 LAB — I-STAT CG4 LACTIC ACID, ED
Lactic Acid, Venous: 1.9 mmol/L (ref 0.5–1.9)
Lactic Acid, Venous: 3.6 mmol/L (ref 0.5–1.9)
Lactic Acid, Venous: 4.5 mmol/L (ref 0.5–1.9)
Lactic Acid, Venous: 8.4 mmol/L (ref 0.5–1.9)

## 2023-01-13 LAB — CBC WITH DIFFERENTIAL/PLATELET
Abs Immature Granulocytes: 0.02 10*3/uL (ref 0.00–0.07)
Basophils Absolute: 0 10*3/uL (ref 0.0–0.1)
Basophils Relative: 1 %
Eosinophils Absolute: 0 10*3/uL (ref 0.0–0.5)
Eosinophils Relative: 1 %
HCT: 39 % (ref 39.0–52.0)
Hemoglobin: 13.2 g/dL (ref 13.0–17.0)
Immature Granulocytes: 0 %
Lymphocytes Relative: 20 %
Lymphs Abs: 1.2 10*3/uL (ref 0.7–4.0)
MCH: 31.1 pg (ref 26.0–34.0)
MCHC: 33.8 g/dL (ref 30.0–36.0)
MCV: 91.8 fL (ref 80.0–100.0)
Monocytes Absolute: 0.3 10*3/uL (ref 0.1–1.0)
Monocytes Relative: 6 %
Neutro Abs: 4.4 10*3/uL (ref 1.7–7.7)
Neutrophils Relative %: 72 %
Platelets: 252 10*3/uL (ref 150–400)
RBC: 4.25 MIL/uL (ref 4.22–5.81)
RDW: 22.2 % — ABNORMAL HIGH (ref 11.5–15.5)
Smear Review: NORMAL
WBC: 6 10*3/uL (ref 4.0–10.5)
nRBC: 0.3 % — ABNORMAL HIGH (ref 0.0–0.2)

## 2023-01-13 LAB — COMPREHENSIVE METABOLIC PANEL
ALT: 14 U/L (ref 0–44)
AST: 22 U/L (ref 15–41)
Albumin: 2.7 g/dL — ABNORMAL LOW (ref 3.5–5.0)
Alkaline Phosphatase: 48 U/L (ref 38–126)
Anion gap: 22 — ABNORMAL HIGH (ref 5–15)
BUN: 7 mg/dL — ABNORMAL LOW (ref 8–23)
CO2: 13 mmol/L — ABNORMAL LOW (ref 22–32)
Calcium: 8.7 mg/dL — ABNORMAL LOW (ref 8.9–10.3)
Chloride: 102 mmol/L (ref 98–111)
Creatinine, Ser: 1.47 mg/dL — ABNORMAL HIGH (ref 0.61–1.24)
GFR, Estimated: 51 mL/min — ABNORMAL LOW (ref >60)
Glucose, Bld: 158 mg/dL — ABNORMAL HIGH (ref 70–99)
Potassium: 3.7 mmol/L (ref 3.5–5.1)
Sodium: 137 mmol/L (ref 135–145)
Total Bilirubin: 1.3 mg/dL — ABNORMAL HIGH (ref <1.2)
Total Protein: 5.4 g/dL — ABNORMAL LOW (ref 6.5–8.1)

## 2023-01-13 LAB — PROTIME-INR
INR: 1.9 — ABNORMAL HIGH (ref 0.8–1.2)
Prothrombin Time: 22.4 s — ABNORMAL HIGH (ref 11.4–15.2)

## 2023-01-13 LAB — MAGNESIUM: Magnesium: 1.3 mg/dL — ABNORMAL LOW (ref 1.7–2.4)

## 2023-01-13 LAB — CK: Total CK: 97 U/L (ref 49–397)

## 2023-01-13 LAB — OSMOLALITY: Osmolality: 313 mosm/kg — ABNORMAL HIGH (ref 275–295)

## 2023-01-13 LAB — BRAIN NATRIURETIC PEPTIDE: B Natriuretic Peptide: 721.6 pg/mL — ABNORMAL HIGH (ref 0.0–100.0)

## 2023-01-13 LAB — APTT: aPTT: 33 s (ref 24–36)

## 2023-01-13 LAB — PROCALCITONIN: Procalcitonin: 5.4 ng/mL

## 2023-01-13 MED ORDER — IOHEXOL 350 MG/ML SOLN
75.0000 mL | Freq: Once | INTRAVENOUS | Status: AC | PRN
  Administered 2023-01-13: 75 mL via INTRAVENOUS

## 2023-01-13 MED ORDER — PANTOPRAZOLE SODIUM 40 MG PO TBEC
40.0000 mg | DELAYED_RELEASE_TABLET | Freq: Two times a day (BID) | ORAL | Status: DC
  Administered 2023-01-13: 40 mg via ORAL
  Filled 2023-01-13: qty 1

## 2023-01-13 MED ORDER — MELATONIN 3 MG PO TABS: 3.0000 mg | ORAL_TABLET | Freq: Every evening | ORAL | Status: DC | PRN

## 2023-01-13 MED ORDER — VANCOMYCIN HCL IN DEXTROSE 1-5 GM/200ML-% IV SOLN
1000.0000 mg | INTRAVENOUS | Status: AC
  Administered 2023-01-13 – 2023-01-14 (×2): 1000 mg via INTRAVENOUS
  Filled 2023-01-13 (×2): qty 200

## 2023-01-13 MED ORDER — ALBUTEROL SULFATE (2.5 MG/3ML) 0.083% IN NEBU: 2.5000 mg | INHALATION_SOLUTION | RESPIRATORY_TRACT | Status: DC | PRN

## 2023-01-13 MED ORDER — OXYCODONE HCL 5 MG PO TABS
10.0000 mg | ORAL_TABLET | Freq: Once | ORAL | Status: AC
  Administered 2023-01-13: 10 mg via ORAL
  Filled 2023-01-13: qty 2

## 2023-01-13 MED ORDER — ONDANSETRON HCL 4 MG/2ML IJ SOLN
4.0000 mg | Freq: Four times a day (QID) | INTRAMUSCULAR | Status: DC | PRN
  Administered 2023-01-14: 4 mg via INTRAVENOUS
  Filled 2023-01-13: qty 2

## 2023-01-13 MED ORDER — FENOFIBRATE 160 MG PO TABS: 160.0000 mg | ORAL_TABLET | Freq: Every day | ORAL | Status: DC

## 2023-01-13 MED ORDER — INSULIN ASPART 100 UNIT/ML IJ SOLN: 0.0000 [IU] | Freq: Three times a day (TID) | INTRAMUSCULAR | Status: DC

## 2023-01-13 MED ORDER — ACETAMINOPHEN 650 MG RE SUPP: 650.0000 mg | Freq: Four times a day (QID) | RECTAL | Status: DC | PRN

## 2023-01-13 MED ORDER — LACTATED RINGERS IV BOLUS (SEPSIS)
500.0000 mL | Freq: Once | INTRAVENOUS | Status: AC
  Administered 2023-01-13: 500 mL via INTRAVENOUS

## 2023-01-13 MED ORDER — METOPROLOL SUCCINATE ER 25 MG PO TB24: 25.0000 mg | ORAL_TABLET | Freq: Every day | ORAL | Status: DC

## 2023-01-13 MED ORDER — ACETAMINOPHEN 325 MG PO TABS: 650.0000 mg | ORAL_TABLET | Freq: Four times a day (QID) | ORAL | Status: DC | PRN

## 2023-01-13 MED ORDER — LACTATED RINGERS IV SOLN: INTRAVENOUS | Status: DC

## 2023-01-13 MED ORDER — METRONIDAZOLE 500 MG/100ML IV SOLN
500.0000 mg | Freq: Once | INTRAVENOUS | Status: AC
  Administered 2023-01-13: 500 mg via INTRAVENOUS
  Filled 2023-01-13: qty 100

## 2023-01-13 MED ORDER — MELATONIN 5 MG PO TABS: 20.0000 mg | ORAL_TABLET | Freq: Every evening | ORAL | Status: DC | PRN

## 2023-01-13 MED ORDER — ESCITALOPRAM OXALATE 10 MG PO TABS: 10.0000 mg | ORAL_TABLET | Freq: Every day | ORAL | Status: DC

## 2023-01-13 MED ORDER — GABAPENTIN 300 MG PO CAPS
300.0000 mg | ORAL_CAPSULE | Freq: Three times a day (TID) | ORAL | Status: DC
  Administered 2023-01-13: 300 mg via ORAL
  Filled 2023-01-13: qty 1

## 2023-01-13 MED ORDER — LACTATED RINGERS IV BOLUS (SEPSIS)
1000.0000 mL | Freq: Once | INTRAVENOUS | Status: AC
  Administered 2023-01-13: 1000 mL via INTRAVENOUS

## 2023-01-13 MED ORDER — AMOXICILLIN-POT CLAVULANATE 500-125 MG PO TABS
1.0000 | ORAL_TABLET | Freq: Two times a day (BID) | ORAL | Status: AC
  Administered 2023-01-13: 1 via ORAL
  Filled 2023-01-13: qty 1

## 2023-01-13 MED ORDER — BUDESON-GLYCOPYRROL-FORMOTEROL 160-9-4.8 MCG/ACT IN AERO: 1.0000 | INHALATION_SPRAY | Freq: Two times a day (BID) | RESPIRATORY_TRACT | Status: DC

## 2023-01-13 MED ORDER — METOPROLOL SUCCINATE ER 25 MG PO TB24: 25.0000 mg | ORAL_TABLET | Freq: Every day | ORAL | 3 refills | Status: DC

## 2023-01-13 MED ORDER — VANCOMYCIN HCL IN DEXTROSE 1-5 GM/200ML-% IV SOLN: 1000.0000 mg | Freq: Once | INTRAVENOUS | Status: DC

## 2023-01-13 MED ORDER — OXYCODONE HCL 5 MG PO TABS
10.0000 mg | ORAL_TABLET | Freq: Every day | ORAL | Status: DC | PRN
  Administered 2023-01-14: 10 mg via ORAL
  Filled 2023-01-13 (×2): qty 2

## 2023-01-13 MED ORDER — APIXABAN 5 MG PO TABS
5.0000 mg | ORAL_TABLET | Freq: Two times a day (BID) | ORAL | Status: DC
  Administered 2023-01-13: 5 mg via ORAL
  Filled 2023-01-13: qty 1

## 2023-01-13 MED ORDER — INSULIN GLARGINE-YFGN 100 UNIT/ML ~~LOC~~ SOLN
12.0000 [IU] | Freq: Every day | SUBCUTANEOUS | Status: DC
  Administered 2023-01-14: 12 [IU] via SUBCUTANEOUS
  Filled 2023-01-13 (×2): qty 0.12

## 2023-01-13 MED ORDER — SODIUM CHLORIDE 0.9 % IV SOLN
2.0000 g | Freq: Once | INTRAVENOUS | Status: AC
  Administered 2023-01-13: 2 g via INTRAVENOUS
  Filled 2023-01-13: qty 12.5

## 2023-01-13 NOTE — Telephone Encounter (Signed)
Patient is calling back to report he is currently on the way to the ED per RN's recommendation and will be arriving in about 30 mins.

## 2023-01-13 NOTE — ED Triage Notes (Addendum)
Pt presents after being sent by his physician from follow up/wound check after pacemaker placement. Wound looked good however BP was noted to be low in the 70s there.  Pt has no complaints.  BP is stable at time of triage.  Upon further questioning pt states there was some concern about staph infection in his right forearm.

## 2023-01-13 NOTE — Sepsis Progress Note (Addendum)
Notified provider of need to order repeat lactic acid.  Secure chat to bedside RN requesting updated weight be entered into Epic.Marland Kitchen

## 2023-01-13 NOTE — ED Provider Triage Note (Signed)
Emergency Medicine Provider Triage Evaluation Note  Steve Anthony , a 72 y.o. male  was evaluated in triage.  Pt complains of hypotension.  Patient recently had a pacemaker placed.  Apparently hypotensive at his wound check today and told to come in to rule out sepsis due to a known staph infection in the right arm.  Review of Systems  Positive:   Low pressure Negative: fever  Physical Exam  BP (!) 119/97 (BP Location: Right Arm)   Pulse 79   Temp 97.7 F (36.5 C)   Resp 20   SpO2 100%  Gen:   Awake, no distress   Resp:  Normal effort  MSK:   Moves extremities without difficulty  Other:    Medical Decision Making  Medically screening exam initiated at 4:30 PM.  Appropriate orders placed.  Steve Anthony was informed that the remainder of the evaluation will be completed by another provider, this initial triage assessment does not replace that evaluation, and the importance of remaining in the ED until their evaluation is complete.     Arthor Captain, PA-C 01/13/23 [phone]

## 2023-01-13 NOTE — Telephone Encounter (Signed)
I let the patient wife know we already spoke with them about the ER recommendations.

## 2023-01-13 NOTE — ED Provider Notes (Signed)
[place] EMERGENCY DEPARTMENT AT [facility] Provider Note   CSN: [id number] Arrival date & time: 01/13/23  1533     History  Chief Complaint  Patient presents with   Hypotension    Steve Anthony is a 72 y.o. male.  72 yo M with a chief complaints of not feeling well.  He is not really felt like eating and drinking for the past 3 to 4 days.  He is currently on antibiotics for a wound infection after dermatology removal of a skin lesion.  He is not sure why he has not been eating and drinking.  Denies any fevers or chills.  Denies abdominal pain denies chest pain denies cough congestion or fever.  Denies urinary symptoms.  He feels like the wound on his arm is getting better.  He went to see his cardiologist today and they found his blood pressure to be in the 70s and he was sent here for evaluation.        Home Medications Prior to Admission medications   Medication Sig Start Date End Date Taking? Authorizing Provider  acetaminophen (TYLENOL) 500 MG tablet Take 1,000 mg by mouth every 6 (six) hours as needed for mild pain, fever or headache.    [provider]  albuterol (VENTOLIN HFA) 108 (90 Base) MCG/ACT inhaler Inhale 1-2 puffs into the lungs as needed for wheezing or shortness of breath. 10/08/22   [provider]  apixaban (ELIQUIS) 5 MG TABS tablet Take 1 tablet (5 mg total) by mouth 2 (two) times daily. 12/30/22   Sherie Don, NP  baclofen (LIORESAL) 10 MG tablet Take 10 mg by mouth every 8 (eight) hours as needed for muscle spasms.    [provider]  BREZTRI AEROSPHERE 160-9-4.8 MCG/ACT AERO Inhale 1 puff into the lungs 2 (two) times daily. 10/08/22   [provider]  Cyanocobalamin (VITAMIN B-12 IJ) Inject 1,000 mcg as directed every 30 (thirty) days.    [provider]  docusate sodium (COLACE) 100 MG capsule Take 1 capsule (100 mg total) by mouth 2 (two) times daily as needed for mild constipation. Patient  taking differently: Take 100 mg by mouth 2 (two) times daily. 01/07/20   Pahwani, Kasandra Knudsen, MD  escitalopram (LEXAPRO) 10 MG tablet Take 10 mg by mouth daily. 08/06/22 08/06/23  [provider]  fenofibrate 160 MG tablet Take 160 mg by mouth daily.    [provider]  ferrous sulfate 325 (65 FE) MG tablet Take 325 mg by mouth daily with breakfast.    [provider]  furosemide (LASIX) 20 MG tablet Take 40 mg by mouth daily as needed for fluid or edema. 09/09/21   [provider]  gabapentin (NEURONTIN) 300 MG capsule Take 1 capsule (300 mg total) by mouth 3 (three) times daily. 01/07/20   Pahwani, Kasandra Knudsen, MD  Insulin Glargine (BASAGLAR KWIKPEN) 100 UNIT/ML Inject 25-35 Units into the skin daily. Pt states that if his blood sugar is >130, he takes 35 units and if it is <130, he takes 25 units    [provider]  Insulin Pen Needle 29G X MISC 1 Device by Does not apply route 3 (three) times daily as needed (for use with insulin pen). 06/18/19   Jerald Kief, MD  JARDIANCE 25 MG TABS tablet Take 25 mg by mouth daily. 10/12/21   [provider]  magnesium oxide (MAG-OX) 400 MG tablet Take 1 tablet (400 mg total) by mouth daily.  06/12/22   Barrett, Joline Salt, PA-C  Melatonin 10 MG CAPS Take 20 mg by mouth at bedtime.    [provider]  metFORMIN (GLUCOPHAGE) 1000 MG tablet Take 1,000 mg by mouth 2 (two) times daily.    [provider]  metoprolol succinate (TOPROL-XL) 25 MG 24 hr tablet Take 1 tablet (25 mg total) by mouth daily. Take with or immediately following a meal. 01/13/23 02/12/23  Yates Decamp, MD  Multiple Vitamins-Minerals (PRESERVISION AREDS) TABS Take 1 tablet by mouth 2 (two) times daily.    [provider]  nitroGLYCERIN (NITROSTAT) 0.4 MG SL tablet Place 0.4 mg under the tongue every 5 (five) minutes as needed for chest pain.    [provider]  ondansetron (ZOFRAN) 4 MG tablet Take 4 mg by mouth every  8 (eight) hours as needed for nausea or vomiting.    [provider]  Oxycodone HCl 10 MG TABS Take 1 tablet by mouth daily as needed (For pain). Take 1 tablet every 4-6 hours as needed 04/09/22   [provider]  OXYGEN Inhale 2-4 L into the lungs continuous.    [provider]  pantoprazole (PROTONIX) 40 MG tablet Take 1 tablet (40 mg total) by mouth 2 (two) times daily. 11/29/22   Lynann Bologna, MD  Tamsulosin HCl (FLOMAX) 0.4 MG CAPS Take 0.4 mg by mouth daily.    [provider]  zolpidem (AMBIEN) 5 MG tablet Take 0.5 tablets (2.5 mg total) by mouth at bedtime as needed for sleep. Patient taking differently: Take 5 mg by mouth at bedtime. 05/06/22   Gordan Payment., MD      Allergies    Penicillins    Review of Systems   Review of Systems  Physical Exam Updated Vital Signs BP 121/83   Pulse 81   Temp 97.7 F (36.5 C)   Resp 16   Wt 88.5 kg   SpO2 98%   BMI 26.45 kg/m  Physical Exam Vitals and nursing note reviewed.  Constitutional:      Appearance: He is well-developed.  HENT:     Head: Normocephalic and atraumatic.  Eyes:     Pupils: Pupils are equal, round, and reactive to light.  Neck:     Vascular: No JVD.  Cardiovascular:     Rate and Rhythm: Normal rate and regular rhythm.     Heart sounds: No murmur heard.    No friction rub. No gallop.  Pulmonary:     Effort: No respiratory distress.     Breath sounds: No wheezing.  Abdominal:     General: There is no distension.     Tenderness: There is no abdominal tenderness. There is no guarding or rebound.  Musculoskeletal:        General: Normal range of motion.     Cervical back: Normal range of motion and neck supple.  Skin:    Coloration: Skin is not pale.     Findings: No rash.     Comments: Right dorsal forearm with a ulceration, wound is pink some granulation tissue no drainage no surrounding erythema or induration.  Neurological:     Mental Status: He is alert and  oriented to person, place, and time.  Psychiatric:        Behavior: Behavior normal.     ED Results / Procedures / Treatments   Labs (all labs ordered are listed, but only abnormal results are displayed) Labs Reviewed  COMPREHENSIVE METABOLIC PANEL - Abnormal; Notable for the  following components:      Result Value   CO2 13 (*)    Glucose, Bld 158 (*)    BUN 7 (*)    Creatinine, Ser 1.47 (*)    Calcium 8.7 (*)    Total Protein 5.4 (*)    Albumin 2.7 (*)    Total Bilirubin 1.3 (*)    GFR, Estimated 51 (*)    Anion gap 22 (*)    All other components within normal limits  CBC WITH DIFFERENTIAL/PLATELET - Abnormal; Notable for the following components:   RDW 22.2 (*)    nRBC 0.3 (*)    All other components within normal limits  PROTIME-INR - Abnormal; Notable for the following components:   Prothrombin Time 22.4 (*)    INR 1.9 (*)    All other components within normal limits  URINALYSIS, W/ REFLEX TO CULTURE (INFECTION SUSPECTED) - Abnormal; Notable for the following components:   Glucose, UA >=500 (*)    Ketones, ur 5 (*)    Bacteria, UA RARE (*)    All other components within normal limits  I-STAT CG4 LACTIC ACID, ED - Abnormal; Notable for the following components:   Lactic Acid, Venous 4.5 (*)    All other components within normal limits  I-STAT CG4 LACTIC ACID, ED - Abnormal; Notable for the following components:   Lactic Acid, Venous 8.4 (*)    All other components within normal limits  CULTURE, BLOOD (ROUTINE X 2)  CULTURE, BLOOD (ROUTINE X 2)  APTT  I-STAT CG4 LACTIC ACID, ED    EKG EKG Interpretation Date/Time:  Thursday January 13 2023 16:42:32 EST Ventricular Rate:  82 PR Interval:    QRS Duration:  134 QT Interval:  502 QTC Calculation: 586 R Axis:   106  Text Interpretation: Ventricular-paced rhythm with occasional Premature ventricular complexes Biventricular pacemaker detected Abnormal ECG No significant change since last tracing Confirmed by  Melene Plan [phone]) on 01/13/2023 5:57:14 PM  Radiology [facility] DEVICE CHECK Result Date: 01/13/2023 Wound check appointment. Steri-strips removed. Wound CDI. well healed. Normal device function. Device programmed at 3.5V programmed on for extra safety margin until 3 month visit. No mode switches or high ventricular rates noted. Patient educated about wound care, arm mobility, lifting restrictions. ROV in 3 months with implanting physician. Decreased rate from 90 to 80 post AVN ablation. Pt c/o hypoTN, SOB, fatigue, BLE edema. BP checked twice in each arm and consistently low 70's/50's. [employer] assessed pt and med list. D/c losartan and metop tartrate. Started metop succinate. Appointment on 12/27 w/ [facility]. 12/30  w/ [facility] in [place]. Notified WC as FYI. Update: after reviewing chart, called pt and advised he go to [facility] ED. Notifed WC. Pt agreeable.Raj Janus, RN   Procedures .Critical Care  Performed by: Melene Plan, DO Authorized by: Melene Plan, DO   Critical care provider statement:    Critical care time (minutes):  80   Critical care time was exclusive of:  Separately billable procedures and treating other patients   Critical care was time spent personally by me on the following activities:  Development of treatment plan with patient or surrogate, discussions with consultants, evaluation of patient's response to treatment, examination of patient, ordering and review of laboratory studies, ordering and review of radiographic studies, ordering and performing treatments and interventions, pulse oximetry, re-evaluation of patient's condition and review of old charts   Care discussed with: admitting provider       Medications Ordered in  ED Medications  lactated ringers infusion (has no administration in time range)  lactated ringers bolus 1,000 mL (0 mLs Intravenous Stopped 01/13/23 1938)    And  lactated ringers bolus 1,000 mL (1,000 mLs Intravenous New  Bag/Given 01/13/23 1939)    And  lactated ringers bolus 500 mL (has no administration in time range)  metroNIDAZOLE (FLAGYL) IVPB 500 mg (has no administration in time range)  vancomycin (VANCOCIN) IVPB 1000 mg/200 mL premix (has no administration in time range)  ceFEPIme (MAXIPIME) 2 g in sodium chloride 0.9 % 100 mL IVPB (0 g Intravenous Stopped 01/13/23 1939)    ED Course/ Medical Decision Making/ A&P                                 Medical Decision Making Amount and/or Complexity of Data Reviewed Radiology: ordered.  Risk Prescription drug management.   72 yo M with a chief complaints of not feeling well.  Patient has been eating and drinking for the past 3 to 4 days.  Suspect the patient is profoundly dehydrated.  Will order IV fluids.  Patient's lactate has resulted at 4.5.  I do not have an obvious source but so I will treat him for presumed sepsis with documented low blood pressure and his cardiologist office and now elected to 4.5.  I suspect he is more likely profoundly dehydrated.  His renal function is not significantly different than his baseline.  He does have a metabolic acidosis with anion gap.  Patient's repeat lactate is 8.5.  Because of the uptrending lactate I discussed the case with critical care.  Spoke with Dr. Gaynell Face.  She thought most likely this was due to the patient's home medications of metformin and Jardiance.  Felt if they were held then he likely would have improvement of his numbers.  With no hypotension for [place] here otherwise well-appearing felt reasonable to admit to medicine.   The patients results and plan were reviewed and discussed.   Any x-rays performed were independently reviewed by myself.   Differential diagnosis were considered with the presenting HPI.  Medications  lactated ringers infusion (has no administration in time range)  lactated ringers bolus 1,000 mL (0 mLs Intravenous Stopped 01/13/23 1938)    And  lactated ringers bolus  1,000 mL (1,000 mLs Intravenous New Bag/Given 01/13/23 1939)    And  lactated ringers bolus 500 mL (has no administration in time range)  metroNIDAZOLE (FLAGYL) IVPB 500 mg (has no administration in time range)  vancomycin (VANCOCIN) IVPB 1000 mg/200 mL premix (has no administration in time range)  ceFEPIme (MAXIPIME) 2 g in sodium chloride 0.9 % 100 mL IVPB (0 g Intravenous Stopped 01/13/23 1939)    Vitals:   01/13/23 1614 01/13/23 1915 01/13/23 1938 01/13/23 2015  BP: (!) 119/97 118/78  121/83  Pulse: 79 80  81  Resp: 20 16  16   Temp: 97.7 F (36.5 C)     SpO2: 100% 100%  98%  Weight:   88.5 kg     Final diagnoses:  Dehydration  Lactic acidosis  Hypotension due to hypovolemia    Admission/ observation were discussed with the admitting physician, patient and/or family and they are comfortable with the plan.         Final Clinical Impression(s) / ED Diagnoses Final diagnoses:  Dehydration  Lactic acidosis  Hypotension due to hypovolemia    Rx / DC Orders ED Discharge Orders  None         Melene Plan, DO 01/13/23 2020

## 2023-01-13 NOTE — Patient Instructions (Addendum)
Stop taking:  Losartan Potassium 2.5mg  Metoprolol TARTRATE  Start taking: Metoprolol SUCCINATE 25mg  once daily    After Your Pacemaker   Monitor your pacemaker site for redness, swelling, and drainage. Call the device clinic at [phone] if you experience these symptoms or fever/chills.  Your incision was closed with Steri-strips or staples:  You may shower 7 days after your procedure and wash your incision with soap and water. Avoid lotions, ointments, or perfumes over your incision until it is well-healed.  You may use a hot tub or a pool after your wound check appointment if the incision is completely closed.  Do not lift, push or pull greater than 10 pounds with the affected arm until 6 weeks after your procedure. There are no other restrictions in arm movement after your wound check appointment.  You may drive, unless driving has been restricted by your healthcare providers.  Remote monitoring is used to monitor your pacemaker from home. This monitoring is scheduled every 91 days by our office. It allows [place] to keep an eye on the functioning of your device to ensure it is working properly. You will routinely see your Electrophysiologist annually (more often if necessary).

## 2023-01-13 NOTE — Sepsis Progress Note (Signed)
eLink is following this Code Sepsis. °

## 2023-01-13 NOTE — H&P (Signed)
History and Physical      Steve Anthony UUV:[id number] DOB: 1950-09-28 DOA: 01/13/2023; DOS: 01/13/2023  PCP: Gordan Payment., MD  Patient coming from: home   I have personally briefly reviewed patient's old medical records in [facility]  Chief Complaint: Hypotension  HPI: Steve Anthony is a 72 y.o. male with medical history significant for chronic systolic heart failure, paroxysmal atrial fibrillation complicated by sick sinus syndrome status post ventricular pacemaker in December 2024, chronically anticoagulated on Eliquis, COPD, hyperlipidemia, type 2 diabetes mellitus, chronic hypoxic respiratory failure on 2 L continuous nasal cannula at baseline, CKD 3A associated baseline creatinine range 1.2-1.5, who is admitted to [facility] on 01/13/2023 with lactic acidosis after presenting from home to [facility] ED complaining of hypotension.   The patient had recently undergone biventricular pacemaker placement on 12/27/2022 in the setting of history of paroxysmal atrial fibrillation complicated by sick sinus syndrome.  Mild he attended his routine scheduled outpatient cardiology follow-up today, at which time vital signs were notable for systolic blood pressures in the 70s, prompting recommendation from cardiology for the patient to present to the local emergency department for further evaluation management thereof.  The patient reports that over the last 3 to 4 days, he has been experiencing a decline in appetite, resulting and diminished oral intake of both food and water over that timeframe.  Otherwise, he denies any recent acute symptoms, including no acute shortness of breath, chest pain, palpitations, diaphoresis, nausea, vomiting, diaphoresis, dizziness, presyncope, or syncope.  He also denies any recent subjective fever, chills, rigors, or generalized myalgias.  No recent headache or neck stiffness no recent abdominal discomfort, dysuria, gross hematuria or loose stool.  No recent  acute focal weakness or acute focal numbness/paresthesias.  He conveys a history of type 2 diabetes mellitus, with outpatient medications that include metformin as well as Jardiance.  Denies any recent alcohol consumption or use of recreational drugs.    ED Course:  Vital signs in the ED were notable for the following: Afebrile; heart rates in the 70s to 80s; systolic blood pressures in the 1 teens to 120s, with most recent blood pressure noted to be 121/83, respiratory rate 16-20, oxygen saturation 98 to 100% on baseline 2 L nasal cannula.  Labs were notable for the following: CMP was notable for the following: Sodium 137, bicarbonate 13, anion gap 22, creatinine 1.47 compared to most recent prior creatinine data point of 1.59 on 11/29/2022, glucose 158, calcium adjusted for mild hypoalbuminemia noted to be 9.7, avidin 2.7, total bilirubin 1.3.  Otherwise, liver enzymes within normal limits.  Initial lactic acid 4.5, with repeat value trending up to 8.4, and third lactic acid level ordered, both result currently pending.  This is relative to most recent prior lactic acid level 0.7 in December 2021.  CBC notable for white blood cell count 6000 hemoglobin 13.2.  INR 1.9.  Urinalysis notable for no white blood cells, leukocyte esterase/nitrate negative, 5 ketones as well as positive hyaline casts and specific gravity 1.027.  Blood cultures x 2 were collected prior to initiation of IV antibiotics.  Per my interpretation, EKG in ED demonstrated the following: Compared to most recent prior EKG performed on 12/28/2022, today's EKG shows ventricular paced rhythm single PVC, heart rate 82, and T wave inversions in leads II, 3, aVF, V3 through V6, all of which appear unchanged relative to most recent prior EKG, demonstrating no evidence of ST changes, including no evidence of ST elevation.  Imaging  in the ED, per corresponding formal radiology read, was notable for the following: 2 view chest x-ray shows chronic  emphysematous changes, no evidence of acute cardiopulmonary process, including no evidence of infiltrate, pneumo, effusion, or pneumothorax.  For further evaluation of presenting lactic acidosis with outpatient report of hypotension, CTA chest as well as CT abdomen/pelvis ordered by EDP, with results currently pending.  While in the ED, the following were administered: Cefepime, IV Flagyl, IV vancomycin, lactated Ringer's x 2.5 L bolus followed by initiation continuous LR running at 150 cc/h.  Subsequently, the patient was admitted for further evaluation management of presenting lactic acidosis, with clinical evidence of dehydration.    Review of Systems: As per HPI otherwise 10 point review of systems negative.   Past Medical History:  Diagnosis Date   Abnormal CT of the abdomen 05/27/2018   Abnormal MRI of abdomen 04/14/2018   Acute pancreatitis 02/20/2019   Anemia    Anxiety disorder 04/09/2015   Arthritis    Asthma    Atrial fibrillation (HCC) 11/21/2008   Qualifier: Diagnosis of   By: Ladona Ridgel, MD, Jerrell Mylar        CAD (coronary artery disease)    a. S/P PCI mid LAD (vision stent) 08/31/2004;  b. repeat cath 6/11 - no progression of CAD. c. Cath 10/2012 given moderate CAD on cardiac CT - no change from prior cath.   Centrilobular emphysema (HCC) 10/08/2022   Chest CT 08/04/2022     Chest pain 12/16/2016   CHEST PAIN UNSPECIFIED 11/21/2008   Qualifier: Diagnosis of   By: Remer Macho, EMT-P, Tara         Chronic respiratory failure with hypoxia (HCC) 02/24/2017   Cirrhosis of liver without ascites (HCC) 04/14/2018   CKD (chronic kidney disease), stage III (HCC) 02/20/2019   COPD (chronic obstructive pulmonary disease) (HCC)    COPD without exacerbation (HCC) 05/15/2009   Qualifier: Diagnosis of   By: Vassie Loll MD, Comer Locket.         COVID-19 virus infection    Cyst of nipple, right 11/03/2020   Degeneration of lumbar intervertebral disc 04/09/2015   Diverticulitis    Dyslipidemia     Essential hypertension 10/15/2008   Qualifier: Diagnosis of   By: Flonnie Overman         Gallstones    GERD 10/15/2008   Qualifier: Diagnosis of   By: Flonnie Overman         High risk medication use 04/09/2015   History of COVID-19 01/24/19 02/20/2019   History of kidney stones    History of pelvic fracture 01/01/2020   History of tattoo 08/23/2016   History of UTI 11/28/2019   Hypercoagulable state due to persistent atrial fibrillation (HCC) 03/26/2022   Hyperlipidemia 10/15/2008   Qualifier: Diagnosis of   By: Flonnie Overman         Hypertension    Hyperthyroidism    HYPERTRIGLYCERIDEMIA 10/15/2008   Qualifier: Diagnosis of   By: Flonnie Overman         Hypotension 06/13/2019   Idiopathic acute pancreatitis 04/14/2018   Incisional hernia, without obstruction or gangrene 02/05/2016   Infected pancreatic pseudocyst 06/07/2019   Insomnia 04/09/2015   Iron deficiency anemia 05/03/2022   Kidney mass 07/06/2017   Following with urology. Chao.  Recommend follow up. Last seen 01/2018  Refuses to see again.   CT okay 04/2021     Long term (current) use of anticoagulants [Z79.01] 06/10/2015   Melanoma (HCC)    melanoma removed  from neck    Mild episode of recurrent major depressive disorder (HCC) 08/27/2015   NEPHROLITHIASIS 10/15/2008   Qualifier: Diagnosis of   By: Flonnie Overman         OA (osteoarthritis) of hip 12/18/2014   On home oxygen therapy    "2L q hs" (11/08/2012),  06/04/19 pt. not on oxygen   OSA (obstructive sleep apnea)    a. cpap noncompliance- patient reports on 12/10/14- does not use CPAP   Palpitations    a. 10/2012 - s/p LINQ loop recorder to assess for arrhythmia.    Pancreatic cyst 04/14/2018   Pancreatic pseudocyst 06/07/2019   Pancreatitis    Paroxysmal A-fib (HCC) 11/21/2008   a. S/P PVI 2/11 and 9/11   Pelvic fracture (HCC) 12/31/2019   Persistent atrial fibrillation (HCC) 06/08/2022   PICC (peripherally inserted central catheter) in place     Pneumonia    Protein-calorie malnutrition, severe 06/12/2019   Pseudocyst of pancreas 03/06/2019   Pure hypercholesterolemia    S/P coronary artery stent placement    SHORTNESS OF BREATH 03/19/2009   Qualifier: Diagnosis of   By: Johney Frame MD, James         Sleep apnea 05/17/2009   Qualifier: Diagnosis of   By: Vassie Loll MD, Comer Locket.         SVT (supraventricular tachycardia) (HCC)    a. Atypical AVNRT of the slow pathway   Thoracic aortic aneurysm (HCC) 11/13/2020   Type II diabetes mellitus (HCC)    Unstable angina (HCC) 10/15/2008   Qualifier: Diagnosis of   By: Flonnie Overman          Past Surgical History:  Procedure Laterality Date   ATRIAL ABLATION SURGERY  2007   S/P slow pathway ablation for atypical AVNRT    AV NODE ABLATION N/A 12/27/2022   Procedure: AV NODE ABLATION;  Surgeon: Regan Lemming, MD;  Location: [facility] INVASIVE CV LAB;  Service: Cardiovascular;  Laterality: N/A;   BIOPSY  07/17/2018   Procedure: BIOPSY;  Surgeon: Meridee Score Netty Starring., MD;  Location: [facility] ENDOSCOPY;  Service: Gastroenterology;;   BIV PACEMAKER INSERTION CRT-P N/A 12/27/2022   Procedure: BIV PACEMAKER INSERTION CRT-P;  Surgeon: Regan Lemming, MD;  Location: [facility] INVASIVE CV LAB;  Service: Cardiovascular;  Laterality: N/A;   CARDIOVERSION N/A 04/13/2022   Procedure: CARDIOVERSION;  Surgeon: Chrystie Nose, MD;  Location: [facility] ENDOSCOPY;  Service: Cardiovascular;  Laterality: N/A;   CARDIOVERSION N/A 06/10/2022   Procedure: CARDIOVERSION;  Surgeon: Wendall Stade, MD;  Location: [facility] INVASIVE CV LAB;  Service: Cardiovascular;  Laterality: N/A;   CHOLECYSTECTOMY  ~ 2008   COLON RESECTION  02/2013    COLONOSCOPY  12/25/2007   Colonic polyp status post polypectomy. Internal hemorrhoids.    CORONARY ANGIOPLASTY WITH STENT PLACEMENT  08/30/2004   Vision; to LAD    CYSTOSCOPY W/ STONE MANIPULATION  ~ 1998   "once" (11/08/2012)   EP Study  2008   Negative EP study in [place]    ESOPHAGOGASTRODUODENOSCOPY (EGD) WITH PROPOFOL N/A 07/17/2018   Procedure: ESOPHAGOGASTRODUODENOSCOPY (EGD) WITH PROPOFOL;  Surgeon: Lemar Lofty., MD;  Location: [facility] ENDOSCOPY;  Service: Gastroenterology;  Laterality: N/A;   FRACTURE SURGERY     LEFT HEART CATHETERIZATION WITH CORONARY ANGIOGRAM N/A 11/10/2012   Procedure: LEFT HEART CATHETERIZATION WITH CORONARY ANGIOGRAM;  Surgeon: Kathleene Hazel, MD;  Location: [facility] CATH LAB;  Service: Cardiovascular;  Laterality: N/A;   LOOP RECORDER IMPLANT N/A 11/10/2012   Medtronic LinQ implanted by Dr  Allred for afib management   LUMBAR DISC SURGERY  2000; 2002   POSTERIOR LUMBAR FUSION  2004   PROSTATE SURGERY  ~ 2009   PVI/ CTI ablation  02/25/2009 and 09/2009   S/P afib/ CTI ablation   SPLENECTOMY, TOTAL N/A 06/07/2019   Procedure: SPLENECTOMY;  Surgeon: Almond Lint, MD;  Location: [facility] OR;  Service: General;  Laterality: N/A;   TIBIA FRACTURE SURGERY Right 1990   "broke in 3 places; had rod put in" (11/08/2012)   TIBIA HARDWARE REMOVAL Right ~ 1991   TONSILLECTOMY  1965   TOTAL HIP ARTHROPLASTY Right 12/18/2014   Procedure: RIGHT TOTAL HIP ARTHROPLASTY ANTERIOR APPROACH;  Surgeon: Ollen Gross, MD;  Location: [facility] ORS;  Service: Orthopedics;  Laterality: Right;   UPPER ESOPHAGEAL ENDOSCOPIC ULTRASOUND (EUS) N/A 07/17/2018   Procedure: UPPER ESOPHAGEAL ENDOSCOPIC ULTRASOUND (EUS);  Surgeon: Lemar Lofty., MD;  Location: [facility] ENDOSCOPY;  Service: Gastroenterology;  Laterality: N/A;    Social History:  reports that he quit smoking about 18 years ago. His smoking use included cigarettes. He started smoking about 56 years ago. He has a 114 pack-year smoking history. He quit smokeless tobacco use about 18 years ago.  His smokeless tobacco use included chew. He reports that he does not currently use alcohol. He reports that he does not use drugs.   Allergies  Allergen Reactions   Penicillins Hives and Shortness Of Breath    Did it  involve swelling of the face/tongue/throat, SOB, or low BP? Yes Did it involve sudden or severe rash/hives, skin peeling, or any reaction on the inside of your mouth or nose? No Did you need to seek medical attention at a hospital or doctor's office? Yes (emergency room) When did it last happen?  1974 If all above answers are "NO", may proceed with cephalosporin use.      Family History  Problem Relation Age of Onset   Emphysema Mother    Heart attack Father        CVA   Hypertension Father    Diabetes Father    Heart disease Father        MI   Emphysema Maternal Grandfather        Smoker   Heart disease Paternal Grandfather    Colon cancer Neg Hx    Esophageal cancer Neg Hx    Inflammatory bowel disease Neg Hx    Liver disease Neg Hx    Pancreatic cancer Neg Hx    Rectal cancer Neg Hx    Stomach cancer Neg Hx     Family history reviewed and not pertinent    Prior to Admission medications   Medication Sig Start Date End Date Taking? Authorizing Provider  acetaminophen (TYLENOL) 500 MG tablet Take 1,000 mg by mouth every 6 (six) hours as needed for mild pain, fever or headache.    [provider]  albuterol (VENTOLIN HFA) 108 (90 Base) MCG/ACT inhaler Inhale 1-2 puffs into the lungs as needed for wheezing or shortness of breath. 10/08/22   [provider]  apixaban (ELIQUIS) 5 MG TABS tablet Take 1 tablet (5 mg total) by mouth 2 (two) times daily. 12/30/22   Sherie Don, NP  baclofen (LIORESAL) 10 MG tablet Take 10 mg by mouth every 8 (eight) hours as needed for muscle spasms.    [provider]  BREZTRI AEROSPHERE 160-9-4.8 MCG/ACT AERO Inhale 1 puff into the lungs 2 (two) times daily. 10/08/22   [provider]  Cyanocobalamin (VITAMIN B-12 IJ)  Inject 1,000 mcg as directed every 30 (thirty) days.    [provider]  docusate sodium (COLACE) 100 MG capsule Take 1 capsule (100 mg total) by mouth 2 (two) times daily as needed for  mild constipation. Patient taking differently: Take 100 mg by mouth 2 (two) times daily. 01/07/20   Pahwani, Kasandra Knudsen, MD  escitalopram (LEXAPRO) 10 MG tablet Take 10 mg by mouth daily. 08/06/22 08/06/23  [provider]  fenofibrate 160 MG tablet Take 160 mg by mouth daily.    [provider]  ferrous sulfate 325 (65 FE) MG tablet Take 325 mg by mouth daily with breakfast.    [provider]  furosemide (LASIX) 20 MG tablet Take 40 mg by mouth daily as needed for fluid or edema. 09/09/21   [provider]  gabapentin (NEURONTIN) 300 MG capsule Take 1 capsule (300 mg total) by mouth 3 (three) times daily. 01/07/20   Pahwani, Kasandra Knudsen, MD  Insulin Glargine (BASAGLAR KWIKPEN) 100 UNIT/ML Inject 25-35 Units into the skin daily. Pt states that if his blood sugar is >130, he takes 35 units and if it is <130, he takes 25 units    [provider]  Insulin Pen Needle 29G X MISC 1 Device by Does not apply route 3 (three) times daily as needed (for use with insulin pen). 06/18/19   Jerald Kief, MD  JARDIANCE 25 MG TABS tablet Take 25 mg by mouth daily. 10/12/21   [provider]  magnesium oxide (MAG-OX) 400 MG tablet Take 1 tablet (400 mg total) by mouth daily. 06/12/22   Barrett, Joline Salt, PA-C  Melatonin 10 MG CAPS Take 20 mg by mouth at bedtime.    [provider]  metFORMIN (GLUCOPHAGE) 1000 MG tablet Take 1,000 mg by mouth 2 (two) times daily.    [provider]  metoprolol succinate (TOPROL-XL) 25 MG 24 hr tablet Take 1 tablet (25 mg total) by mouth daily. Take with or immediately following a meal. 01/13/23 02/12/23  Yates Decamp, MD  Multiple Vitamins-Minerals (PRESERVISION AREDS) TABS Take 1 tablet by mouth 2 (two) times daily.    [provider]  nitroGLYCERIN (NITROSTAT) 0.4 MG SL tablet Place 0.4 mg under the tongue every 5 (five) minutes as needed for chest pain.    [provider]  ondansetron (ZOFRAN) 4 MG  tablet Take 4 mg by mouth every 8 (eight) hours as needed for nausea or vomiting.    [provider]  Oxycodone HCl 10 MG TABS Take 1 tablet by mouth daily as needed (For pain). Take 1 tablet every 4-6 hours as needed 04/09/22   [provider]  OXYGEN Inhale 2-4 L into the lungs continuous.    [provider]  pantoprazole (PROTONIX) 40 MG tablet Take 1 tablet (40 mg total) by mouth 2 (two) times daily. 11/29/22   Lynann Bologna, MD  Tamsulosin HCl (FLOMAX) 0.4 MG CAPS Take 0.4 mg by mouth daily.    [provider]  zolpidem (AMBIEN) 5 MG tablet Take 0.5 tablets (2.5 mg total) by mouth at bedtime as needed for sleep. Patient taking differently: Take 5 mg by mouth at bedtime. 05/06/22   Gordan Payment., MD     Objective    Physical Exam: Vitals:   01/13/23 1614 01/13/23 1915 01/13/23 1938 01/13/23 2015  BP: (!) 119/97 118/78  121/83  Pulse: 79 80  81  Resp: 20 16  16   Temp: 97.7 F (36.5 C)  SpO2: 100% 100%  98%  Weight:   88.5 kg     General: appears to be stated age; alert, oriented Skin: warm, dry, no rash Head:  AT/[place] Mouth:  Oral mucosa membranes appear dry, normal dentition Neck: supple; trachea midline Heart:  RRR; did not appreciate any M/R/G Lungs: CTAB, did not appreciate any wheezes, rales, or rhonchi Abdomen: + BS; soft, ND, NT Vascular: 2+ pedal pulses b/l; 2+ radial pulses b/l Extremities: no peripheral edema, no muscle wasting Neuro: strength and sensation intact in upper and lower extremities b/l    Labs on Admission: I have personally reviewed following labs and imaging studies  CBC: Recent Labs  Lab 01/13/23 1634  WBC 6.0  NEUTROABS 4.4  HGB 13.2  HCT 39.0  MCV 91.8  PLT 252   Basic Metabolic Panel: Recent Labs  Lab 01/13/23 1634  NA 137  K 3.7  CL 102  CO2 13*  GLUCOSE 158*  BUN 7*  CREATININE 1.47*  CALCIUM 8.7*   GFR: Estimated Creatinine Clearance: 50.6 mL/min (A) (by C-G formula based on SCr of  1.47 mg/dL (H)). Liver Function Tests: Recent Labs  Lab 01/13/23 1634  AST 22  ALT 14  ALKPHOS 48  BILITOT 1.3*  PROT 5.4*  ALBUMIN 2.7*   No results for input(s): "LIPASE", "AMYLASE" in the last 168 hours. No results for input(s): "AMMONIA" in the last 168 hours. Coagulation Profile: Recent Labs  Lab 01/13/23 1634  INR 1.9*   Cardiac Enzymes: No results for input(s): "CKTOTAL", "CKMB", "CKMBINDEX", "TROPONINI" in the last 168 hours. BNP (last 3 results) No results for input(s): "PROBNP" in the last 8760 hours. HbA1C: No results for input(s): "HGBA1C" in the last 72 hours. CBG: No results for input(s): "GLUCAP" in the last 168 hours. Lipid Profile: No results for input(s): "CHOL", "HDL", "LDLCALC", "TRIG", "CHOLHDL", "LDLDIRECT" in the last 72 hours. Thyroid Function Tests: No results for input(s): "TSH", "T4TOTAL", "FREET4", "T3FREE", "THYROIDAB" in the last 72 hours. Anemia Panel: No results for input(s): "VITAMINB12", "FOLATE", "FERRITIN", "TIBC", "IRON", "RETICCTPCT" in the last 72 hours. Urine analysis:    Component Value Date/Time   COLORURINE YELLOW 01/13/2023 1936   APPEARANCEUR CLEAR 01/13/2023 1936   LABSPEC 1.027 01/13/2023 1936   PHURINE 5.0 01/13/2023 1936   GLUCOSEU >=500 (A) 01/13/2023 1936   HGBUR NEGATIVE 01/13/2023 1936   BILIRUBINUR NEGATIVE 01/13/2023 1936   KETONESUR 5 (A) 01/13/2023 1936   PROTEINUR NEGATIVE 01/13/2023 1936   NITRITE NEGATIVE 01/13/2023 1936   LEUKOCYTESUR NEGATIVE 01/13/2023 1936    Radiological Exams on Admission: DG Chest 2 View Result Date: 01/13/2023 CLINICAL DATA:  SOB chronic cough EXAM: CHEST - 2 VIEW COMPARISON:  12/28/2022. FINDINGS: Cardiac silhouette is enlarged. Lungs are hyperinflated suggesting COPD. There is no focal consolidation. No pneumothorax or pleural effusion. Aorta is calcified. There are thoracic degenerative changes. There is a left-sided pacer. IMPRESSION: Findings suggest COPD.  Otherwise no acute  cardiopulmonary disease. Electronically Signed   By: Layla Maw M.D.   On: 01/13/2023 20:20   CUP PACEART INCLINIC DEVICE CHECK Result Date: 01/13/2023 Wound check appointment. Steri-strips removed. Wound CDI. well healed. Normal device function. Device programmed at 3.5V programmed on for extra safety margin until 3 month visit. No mode switches or high ventricular rates noted. Patient educated about wound care, arm mobility, lifting restrictions. ROV in 3 months with implanting physician. Decreased rate from 90 to 80 post AVN ablation. Pt c/o hypoTN, SOB, fatigue, BLE edema. BP checked twice in each arm  and consistently low 70's/50's. [employer] assessed pt and med list. D/c losartan and metop tartrate. Started metop succinate. Appointment on 12/27 w/ [facility]. 12/30  w/ [facility] in [place]. Notified WC as FYI. Update: after reviewing chart, called pt and advised he go to [facility] ED. Notifed WC. Pt agreeable.Raj Janus, RN     Assessment/Plan   Principal Problem:   Lactic acidosis Active Problems:   Hyperlipidemia   CKD stage 3a, GFR 45-59 ml/min (HCC)   Chronic hypoxic respiratory failure (HCC)   DM2 (diabetes mellitus, type 2) (HCC)   Dehydration   Chronic systolic CHF (congestive heart failure) (HCC)   Paroxysmal atrial fibrillation (HCC)   History of COPD     #) Lactic Acidosis: Initial lactic acid 4.5, with repeat value trending up to 8.4.  Suspect that this is multifactorial in etiology, with suspected contributions from dehydration, an element of starvation keto lactic acidosis given significant decline in oral intake over the last 3 to 4 days with urinalysis showing evidence of ketones, as well as potential for pharmacologic contributions that include attributions from metformin and Jardiance, as well as from his scheduled outpatient beta-2 agonist in the form of his Breztri inhaler.  There may also be a palpation from generalized relative hypoperfusion on a  transient basis given report from cardiology clinic of hypotension noted in their office earlier today.  Blood pressure has been stable throughout ED course.  No clinical or radiographic evidence at this time to suggest acutely decompensated heart failure.  No known history of underlying malignancy.  Of note, no evidence of underlying infectious process at this time and in the absence of objective fever or leukocytosis, SIRS criteria for sepsis are not currently met.  Specifically, chest x-ray shows no evidence of infiltrate to suggest pneumonia, while urinalysis is consistent with UTI.  EDP is ordered CTA chest as well as CT Abdo/pelvis to further evaluate.  Plan: CMP/CBC in the AM. Monitor strict I's & O's. Check salicylate level.  Follow for existing order for repeat lactic acid level.  Additionally have ordered repeat lactate to be checked with morning labs.  Check CPK level, serum ethanol level, urinary drug screen. check measured serum osmolality and compare to calculated serum osmolality to eval for osmolar gap.  Check VBG.  Add on procalcitonin level, BNP.  Given his history of chronic systolic heart failure, will continue IV fluids but reduce rate from 150 to 100 cc/h for now.  Follow-up for results of CTA chest and CT Abdo/pelvis, as above.                    #) Dehydration: Clinical suspicion for such, including the appearance of dry oral mucous membranes as well as laboratory findings notable for UA demonstrating elevated specific gravity, and presence of hyaline casts.  This appears consistent with urinalysis finding of ketones, felt to be representative of starvation keto lactic acidosis. appears to be in the setting of   decline in oral intake over the last 3 to 4 days.  While there was report of hypotension at cardiology clinic earlier today, he has remained hemodynamically stable with normotensive blood pressures throughout his ED course.  Of note, his outpatient Lasix is  taken on a as needed basis only.  Plan: Monitor strict I's and O's.  Daily weights.  CMP in the morning. IVF's in form of lactated Ringer's at 100 cc/h x 12 hours.                  #)  Chronic systolic heart failure: documented history of such, with most recent echocardiogram performed on 11/19/2022, which was notable for LVEF 35 to 40%, indeterminate diastolic parameters, moderately reduced right ventricular systolic function, mildly dilated bilateral atria. No clinical or radiographic evidence to suggest acutely decompensated heart failure at this time. home diuretic regimen reportedly consists of the following: Prn Lasix. Home cardiac medications also include the following: Metoprolol succinate 25 mg p.o. daily. Of note, he does not appear to be on an ACE inhibitor or ARB as an outpatient.   Plan: monitor strict I's & O's and daily weights. Repeat BMP in AM. Check serum mag level.  Continue outpatient metoprolol succinate.                  #) Paroxysmal atrial fibrillation: Documented history of such. In setting of CHA2DS2-VASc score of  3, there is an indication for chronic anticoagulation for thromboembolic prophylaxis. Consistent with this, patient is chronically anticoagulated on Eliquis.  His history of paroxysmal atrial fibrillation is complicated by sick sinus syndrome status post biventricular pacemaker placement on 12/27/2022.  Home AV nodal blocking regimen: Metoprolol succinate.  Most recent echocardiogram was performed on 11/19/2022, notable for mildly dilated bilateral atria, with additional results as conveyed above. Presenting EKG ventricular paced rhythm without overt evidence of acute ischemic changes.   Plan: monitor strict I's & O's and daily weights. CMP/CBC in AM. Check serum mag level. Continue home AV nodal blocking regimen.  Continue outpatient Eliquis.                   #) COPD: Documented history thereof, without clinical  evidence of acute exacerbation at this time.  This is in the setting of reported being a former smoker.  Outpatient respiratory regimen includes the following: Breztri inhaler as well as as needed albuterol inhaler.   Plan: cont outpatient Breztri inhaler. Prn albuterol nebulizer. Check CMP and serum magnesium level in the AM.                    #) chronic hypoxic respiratory failure: Documented history of such in the setting of severe COPD as well as chronic systolic heart failure, on continuous 2 L nasal cannula as an outpatient.  Oxygen saturations in the ED are in the mid to high 90s on his baseline 2 L nasal cannula.  On the line: Further evaluation management of COPD as well as chronic systolic heart failure, as above.  Prn albuterol inhaler.                   #) Hyperlipidemia: documented h/o such. On fenofibrate as outpatient.   Plan: continue home fenofibrate.                   #) Type 2 Diabetes Mellitus: documented history of such. Home insulin regimen: Lantus 25 to 35 units SQ nightly.  He conveys that this range and basal insulin is dictated by that morning's fasting a.m. blood sugar , noting that if his fasting a.m. blood sugar is greater than 130, that he takes 35 units of his basal insulin dose the ensuing evening versus if his fasting a.m. blood sugar is less than 130, and then he takes 25 units of basal insulin the following evening.  Not on a short acting insulin at home.  Home oral hypoglycemic agents: Metformin, Jardiance. presenting blood sugar: 158. Most recent A1c noted to be 6.2% when checked on 12-24.  His diabetes is also complicated  by diabetic peripheral polyneuropathy for which she is on gabapentin at home.  in terms of initial dose of basal insulin to be started during this hospitalization, will resume approximately half of outpatient dose in order to reduce risk for ensuing hypoglycemia  Plan: accuchecks QAC and HS with  low dose SSI.  Lantus 12 units SQ nightly, as above. hold home oral hypoglycemic agents during this hospitalization.  Further evaluation management of presenting lactic acidosis, as above.  Resume home gabapentin.                      #) CKD Stage 3A: Documented history of such, with baseline creatinine 1.2-1.5, with presenting creatinine consistent with this baseline.    Plan: Monitor strict I's and O's and daily weights.  Attempt to avoid nephrotoxic agents.  CMP/magnesium level in the AM.       DVT prophylaxis: SCD's + resumption of home Eliquis Code Status: Full code Family Communication: none Disposition Plan: Per Rounding Team Consults called: none;  Admission status: Observation     I SPENT GREATER THAN 75  MINUTES IN CLINICAL CARE TIME/MEDICAL DECISION-MAKING IN COMPLETING THIS ADMISSION.      Chaney Born Raiyan Dalesandro DO [facility]  From 7PM - 7AM   01/13/2023, 8:30 PM

## 2023-01-13 NOTE — Progress Notes (Signed)
ED Pharmacy Antibiotic Sign Off An antibiotic consult was received from an ED provider for vancomycin and cefepime per pharmacy dosing for sepsis with unknown source. A chart review was completed to assess appropriateness.   The following one time order(s) were placed:  Vancomycin 2000 mg IV x1 Cefepime 2 g IV x1  Further antibiotic and/or antibiotic pharmacy consults should be ordered by the admitting provider if indicated.   Thank you for involving pharmacy in this patient's care.  Loura Back, PharmD, BCPS Clinical Pharmacist Clinical phone for 01/13/2023 is [phone] 01/13/2023 6:34 PM

## 2023-01-13 NOTE — Progress Notes (Signed)
Wound check appointment. Steri-strips removed. Wound CDI. well healed. Normal device function. Device programmed at 3.5V programmed on for extra safety margin until 3 month visit. No mode switches or high ventricular rates noted. Patient educated about wound care, arm mobility, lifting restrictions. ROV in 3 months with implanting physician. Decreased rate from 90 to 80 post AVN ablation.   Pt c/o hypoTN, SOB, fatigue, BLE edema. BP checked twice in each arm and consistently low 70's/50's. [facility] assessed pt and med list. D/c losartan and metop tartrate. Started metop succinate. Appointment on 12/27 w/ [facility]. 12/30 w/ [facility] in [place]. Notified WC as FYI.  Update: after reviewing chart, called pt and advised he go to [facility] ED. Notifed WC. Pt agreeable.

## 2023-01-14 DIAGNOSIS — I480 Paroxysmal atrial fibrillation: Secondary | ICD-10-CM

## 2023-01-14 DIAGNOSIS — E872 Acidosis, unspecified: Secondary | ICD-10-CM | POA: Diagnosis not present

## 2023-01-14 DIAGNOSIS — Z8709 Personal history of other diseases of the respiratory system: Secondary | ICD-10-CM

## 2023-01-14 DIAGNOSIS — I5022 Chronic systolic (congestive) heart failure: Secondary | ICD-10-CM

## 2023-01-14 DIAGNOSIS — E860 Dehydration: Principal | ICD-10-CM | POA: Diagnosis present

## 2023-01-14 HISTORY — DX: Chronic systolic (congestive) heart failure: I50.22

## 2023-01-14 HISTORY — DX: Paroxysmal atrial fibrillation: I48.0

## 2023-01-14 LAB — COMPREHENSIVE METABOLIC PANEL
ALT: 12 U/L (ref 0–44)
AST: 15 U/L (ref 15–41)
Albumin: 2.1 g/dL — ABNORMAL LOW (ref 3.5–5.0)
Alkaline Phosphatase: 36 U/L — ABNORMAL LOW (ref 38–126)
Anion gap: 18 — ABNORMAL HIGH (ref 5–15)
BUN: 5 mg/dL — ABNORMAL LOW (ref 8–23)
CO2: 18 mmol/L — ABNORMAL LOW (ref 22–32)
Calcium: 8.1 mg/dL — ABNORMAL LOW (ref 8.9–10.3)
Chloride: 103 mmol/L (ref 98–111)
Creatinine, Ser: 1.09 mg/dL (ref 0.61–1.24)
GFR, Estimated: >60 mL/min (ref >60)
Glucose, Bld: 127 mg/dL — ABNORMAL HIGH (ref 70–99)
Potassium: 3.5 mmol/L (ref 3.5–5.1)
Sodium: 139 mmol/L (ref 135–145)
Total Bilirubin: 1.3 mg/dL — ABNORMAL HIGH (ref <1.2)
Total Protein: 4.3 g/dL — ABNORMAL LOW (ref 6.5–8.1)

## 2023-01-14 LAB — CBG MONITORING, ED
Glucose-Capillary: 93 mg/dL (ref 70–99)
Glucose-Capillary: 99 mg/dL (ref 70–99)

## 2023-01-14 LAB — BLOOD GAS, VENOUS
Acid-base deficit: 7.3 mmol/L — ABNORMAL HIGH (ref 0.0–2.0)
Bicarbonate: 18 mmol/L — ABNORMAL LOW (ref 20.0–28.0)
O2 Saturation: 51.3 %
Patient temperature: 37
pCO2, Ven: 35 mm[Hg] — ABNORMAL LOW (ref 44–60)
pH, Ven: 7.32 (ref 7.25–7.43)
pO2, Ven: <31 mm[Hg] — CL (ref 32–45)

## 2023-01-14 LAB — CBC WITH DIFFERENTIAL/PLATELET
Abs Immature Granulocytes: 0.02 10*3/uL (ref 0.00–0.07)
Basophils Absolute: 0.1 10*3/uL (ref 0.0–0.1)
Basophils Relative: 1 %
Eosinophils Absolute: 0.1 10*3/uL (ref 0.0–0.5)
Eosinophils Relative: 1 %
HCT: 32.4 % — ABNORMAL LOW (ref 39.0–52.0)
Hemoglobin: 11.1 g/dL — ABNORMAL LOW (ref 13.0–17.0)
Immature Granulocytes: 0 %
Lymphocytes Relative: 27 %
Lymphs Abs: 1.8 10*3/uL (ref 0.7–4.0)
MCH: 31.2 pg (ref 26.0–34.0)
MCHC: 34.3 g/dL (ref 30.0–36.0)
MCV: 91 fL (ref 80.0–100.0)
Monocytes Absolute: 0.6 10*3/uL (ref 0.1–1.0)
Monocytes Relative: 9 %
Neutro Abs: 4.2 10*3/uL (ref 1.7–7.7)
Neutrophils Relative %: 62 %
Platelets: 211 10*3/uL (ref 150–400)
RBC: 3.56 MIL/uL — ABNORMAL LOW (ref 4.22–5.81)
RDW: 21.5 % — ABNORMAL HIGH (ref 11.5–15.5)
WBC: 6.7 10*3/uL (ref 4.0–10.5)
nRBC: 0.3 % — ABNORMAL HIGH (ref 0.0–0.2)

## 2023-01-14 LAB — MAGNESIUM: Magnesium: 1.2 mg/dL — ABNORMAL LOW (ref 1.7–2.4)

## 2023-01-14 LAB — LIPASE, BLOOD: Lipase: 75 U/L — ABNORMAL HIGH (ref 11–51)

## 2023-01-14 LAB — BETA-HYDROXYBUTYRIC ACID: Beta-Hydroxybutyric Acid: 1.04 mmol/L — ABNORMAL HIGH (ref 0.05–0.27)

## 2023-01-14 MED ORDER — MAGNESIUM SULFATE 4 GM/100ML IV SOLN: 4.0000 g | Freq: Once | INTRAVENOUS | Status: DC

## 2023-01-14 MED ORDER — SODIUM CHLORIDE 0.9 % IV SOLN: INTRAVENOUS | Status: DC

## 2023-01-14 MED ORDER — OXYCODONE HCL 5 MG PO TABS
5.0000 mg | ORAL_TABLET | Freq: Four times a day (QID) | ORAL | Status: DC | PRN
  Administered 2023-01-14: 5 mg via ORAL
  Filled 2023-01-14: qty 1

## 2023-01-14 NOTE — ED Notes (Signed)
Dr. Arlean Hopping notified of critical lab value - p02 <31

## 2023-01-14 NOTE — Discharge Summary (Signed)
Physician Discharge Summary  RADAMES KARI LKG:[id number] DOB: [birth year] DOA: 01/13/2023  PCP: Gordan Payment., MD  Admit date: 01/13/2023 Discharge date: 01/14/2023  Admitted From: Home Disposition: Home, patient refused any further care during hospitalization  Recommendations for Outpatient Follow-up:  Follow up with PCP in 1-2 weeks Discontinued metformin and Jardiance due to lactic acidosis likely secondary to dehydration from mild acute pancreatitis Please obtain BMP/CBC in one week  Home Health: No Equipment/Devices: None  Discharge Condition: Stable, patient refused any further care during the hospitalization CODE STATUS: Full code Diet recommendation: Clear liquid diet, advance as tolerates  History of present illness:  Steve Anthony is a 72 year old male with past medical history for chronic systolic congestive heart failure, paroxysmal atrial fibrillation on Eliquis, sick sinus syndrome s/p ventricular pacemaker December 2024, HLD, DM2, COPD/chronic hypoxic respiratory failure on 2 L nasal cannula, CKD stage IIIa (baseline creatinine 1.2-1.5) who presented to [facility] ED on 12/19 by discretion of his cardiology clinic for hypotension.  Patient recently underwent biventricular pacemaker placement on 12/27/2022 for sick sinus syndrome and was being seen for routine follow-up outpatient by cardiology in which systolic blood pressures were noted to be in the 70s.  Patient reports over the last 3-4 days, has experienced any decline in appetite, diminished intake of food/water and some mild epigastric abdominal pain.  Denies any sick contacts.  Denies shortness of breath, no chest pain, no palpitations, no nausea/vomiting/diarrhea, no diaphoresis, no dizziness, no syncope, no fever/chills, no myalgias, no headache, no visual changes, no urinary symptoms.  Denies alcohol consumption or recreational drug use.  In the ED, temperature 97.7 F, HR 79, RR 20, BP 119/97, SpO2 100% room air.  WBC  6.0, hemoglobin 13.2, platelet count 252.  Sodium 137, potassium 3.7, chloride 102, CO2 13, glucose 158, BUN 7, creat 1.47.  AST 22, ALT 14, total bilirubin 1.3.  INR 1.9.  Urinalysis with 5 ketones, otherwise unrevealing.  Chest x-ray with findings suggestive of COPD, otherwise no acute cardiopulmonary disease finding.  CT angiogram chest negative for acute pulmonary embolism, no focal consolidation, no pleural effusion, no pneumothorax.  CT abdomen/pelvis with contrast with findings of acute on chronic pancreatitis.  Patient was given cefepime, IV Flagyl, IV vancomycin, LR 2.5 L bolus followed by continuous IVF.  TRH was consulted for further evaluation management of hypotension likely secondary to hypovolemia/dehydration in the setting of starvation ketosis complicated by outpatient use of Jardiance and metformin.  Hospital course:  Starvation ketosis Lactic acidosis Acute metabolic acidosis Hypovolemic hypotension/dehydration Patient presenting by discretion of his cardiology outpatient office for concerns of hypotension with SBP in the 70s.  Patient reports poor oral intake in the days preceding hospitalization including solids/liquids.  Does report some mild epigastric abdominal pain.  Denies diarrhea.  Workup in the ED of significance with serum bicarbonate 13, urinalysis with 5 ketones, lactic acid up to 8.4.  CT abdomen/pelvis with findings consistent with acute on chronic pancreatitis.  Patient was given IV fluid bolus followed by continuous infusion with improvement of blood pressure.  Serum bicarbonate up to 18 and resolution of lactic acidosis.  This all complicated by his outpatient use of Jardiance and metformin which was discontinued.  Discussed with patient findings of acute on chronic pancreatitis on CT scan and treatment options, patient adamantly states will no longer stay in the hospital and he can "manage this at home".  Spouse present at bedside and okay with discharge home.  Given  lactic acidosis has resolved and improvement  in serum bicarbonate, discussed with patient to continue to hydrate and he has Zofran as needed to use at home.  Discussed will discontinue his home metformin and Jardiance for now.  Outpatient follow-up with PCP/cardiology.  Acute on chronic pancreatitis Patient complaining of mild epigastric abdominal pain associate with poor oral intake in 3-4 days preceding hospitalization.  Lipase slightly elevated at 75, CT abdomen/pelvis with findings of acute on chronic pancreatitis.  Patient was treated with IV fluid bolus followed by continuous infusion with improvement of symptoms.  Patient wishes to discharge home and believes he can manage a clear liquid diet and further advance as needed outside the hospital.  Spouse agreeable.  Chronic systolic congestive heart failure Echocardiogram performed on 11/19/2022, which was notable for LVEF 35 to 40%, indeterminate diastolic parameters, moderately reduced right ventricular systolic function, mildly dilated bilateral atria. No clinical or radiographic evidence to suggest acutely decompensated heart failure at this time.  Continue home furosemide and metoprolol.  Outpatient follow-up with cardiology.  Paroxysmal atrial fibrillation: Sick sinus syndrome s/p PPM CHA2DS2-VASc score of  3, continue metoprolol succinate, Eliquis.  Outpatient follow-up with cardiology.    COPD Chronic respiratory failure on 2 L nasal cannula at baseline Continue outpatient supplemental oxygen, Breztri inhaler, albuterol as needed.  Hyperlipidemia Continue fenofibrate  Type 2 diabetes mellitus Hemoglobin A1c 6.2% 12/2022.  Continue home glargine.  Discontinued metformin/Jardiance as above.  Outpatient follow-up with PCP.  CKD stage IIIa Baseline creatinine 1.2-1.5, stable.  Discharge Diagnoses:  Principal Problem:   Lactic acidosis Active Problems:   Hyperlipidemia   CKD stage 3a, GFR 45-59 ml/min (HCC)   Chronic hypoxic  respiratory failure (HCC)   DM2 (diabetes mellitus, type 2) (HCC)   Dehydration   Chronic systolic CHF (congestive heart failure) (HCC)   Paroxysmal atrial fibrillation (HCC)   History of COPD    Discharge Instructions  Discharge Instructions     Call MD for:  difficulty breathing, headache or visual disturbances   Complete by: As directed    Call MD for:  extreme fatigue   Complete by: As directed    Call MD for:  persistant dizziness or light-headedness   Complete by: As directed    Call MD for:  persistant nausea and vomiting   Complete by: As directed    Call MD for:  severe uncontrolled pain   Complete by: As directed    Call MD for:  temperature >100.4   Complete by: As directed    Diet - low sodium heart healthy   Complete by: As directed    Increase activity slowly   Complete by: As directed       Allergies as of 01/14/2023       Reactions   Penicillins Hives, Shortness Of Breath   Did it involve swelling of the face/tongue/throat, SOB, or low BP? Yes Did it involve sudden or severe rash/hives, skin peeling, or any reaction on the inside of your mouth or nose? No Did you need to seek medical attention at a hospital or doctor's office? Yes (emergency room) When did it last happen?  1974 If all above answers are "NO", may proceed with cephalosporin use.        Medication List     STOP taking these medications    Jardiance 25 MG Tabs tablet Generic drug: empagliflozin   metFORMIN 1000 MG tablet Commonly known as: GLUCOPHAGE       TAKE these medications    acetaminophen 500 MG tablet Commonly known as:  TYLENOL Take 1,000 mg by mouth every 6 (six) hours as needed for mild pain, fever or headache.   albuterol 108 (90 Base) MCG/ACT inhaler Commonly known as: VENTOLIN HFA Inhale 1-2 puffs into the lungs as needed for wheezing or shortness of breath.   amoxicillin-clavulanate 500-125 MG tablet Commonly known as: AUGMENTIN Take 1 tablet by mouth 2  (two) times daily.   apixaban 5 MG Tabs tablet Commonly known as: Eliquis Take 1 tablet (5 mg total) by mouth 2 (two) times daily.   baclofen 10 MG tablet Commonly known as: LIORESAL Take 10 mg by mouth every 8 (eight) hours as needed for muscle spasms.   Basaglar KwikPen 100 UNIT/ML Inject 25-35 Units into the skin daily. Pt states that if his blood sugar is >130, he takes 35 units and if it is <130, he takes 25 units   Breztri Aerosphere 160-9-4.8 MCG/ACT Aero Generic drug: Budeson-Glycopyrrol-Formoterol Inhale 1 puff into the lungs 2 (two) times daily.   docusate sodium 100 MG capsule Commonly known as: COLACE Take 1 capsule (100 mg total) by mouth 2 (two) times daily as needed for mild constipation. What changed: when to take this   escitalopram 10 MG tablet Commonly known as: LEXAPRO Take 10 mg by mouth daily.   fenofibrate 160 MG tablet Take 160 mg by mouth daily.   ferrous sulfate 325 (65 FE) MG tablet Take 325 mg by mouth daily with breakfast.   furosemide 20 MG tablet Commonly known as: LASIX Take 40 mg by mouth daily as needed for fluid or edema.   gabapentin 300 MG capsule Commonly known as: NEURONTIN Take 1 capsule (300 mg total) by mouth 3 (three) times daily.   Insulin Pen Needle 29G X Misc 1 Device by Does not apply route 3 (three) times daily as needed (for use with insulin pen).   magnesium oxide 400 MG tablet Commonly known as: MAG-OX Take 1 tablet (400 mg total) by mouth daily.   Melatonin 10 MG Caps Take 20 mg by mouth at bedtime.   metoprolol succinate 25 MG 24 hr tablet Commonly known as: TOPROL-XL Take 1 tablet (25 mg total) by mouth daily. Take with or immediately following a meal.   nitroGLYCERIN 0.4 MG SL tablet Commonly known as: NITROSTAT Place 0.4 mg under the tongue every 5 (five) minutes as needed for chest pain.   ondansetron 4 MG tablet Commonly known as: ZOFRAN Take 4 mg by mouth every 8 (eight) hours as needed for  nausea or vomiting.   Oxycodone HCl 10 MG Tabs Take 1 tablet by mouth daily as needed (For pain). Take 1 tablet every 4-6 hours as needed   OXYGEN Inhale 2 L into the lungs as needed.   pantoprazole 40 MG tablet Commonly known as: PROTONIX Take 1 tablet (40 mg total) by mouth 2 (two) times daily.   PreserVision AREDS Tabs Take 1 tablet by mouth 2 (two) times daily.   tamsulosin 0.4 MG Caps capsule Commonly known as: FLOMAX Take 0.4 mg by mouth daily.   VITAMIN B-12 IJ Inject 1,000 mcg as directed every 30 (thirty) days.   zolpidem 5 MG tablet Commonly known as: Ambien Take 0.5 tablets (2.5 mg total) by mouth at bedtime as needed for sleep. What changed:  how much to take when to take this        Follow-up Information     Gordan Payment., MD. Schedule an appointment as soon as possible for a visit in 1 week(s).  Specialty: Internal Medicine Contact information: [address] [place] [address] [phone]                Allergies  Allergen Reactions   Penicillins Hives and Shortness Of Breath    Did it involve swelling of the face/tongue/throat, SOB, or low BP? Yes Did it involve sudden or severe rash/hives, skin peeling, or any reaction on the inside of your mouth or nose? No Did you need to seek medical attention at a hospital or doctor's office? Yes (emergency room) When did it last happen?  1974 If all above answers are "NO", may proceed with cephalosporin use.      Consultations: None   Procedures/Studies: CT Angio Chest PE W and/or Wo Contrast Result Date: 01/13/2023 CLINICAL DATA:  Abdominal pain, hypotension, PE suspected EXAM: CT ANGIOGRAPHY CHEST CT ABDOMEN AND PELVIS WITH CONTRAST TECHNIQUE: Multidetector CT imaging of the chest was performed using the standard protocol during bolus administration of intravenous contrast. Multiplanar CT image reconstructions and MIPs were obtained to evaluate the vascular anatomy. Multidetector CT  imaging of the abdomen and pelvis was performed using the standard protocol during bolus administration of intravenous contrast. RADIATION DOSE REDUCTION: This exam was performed according to the departmental dose-optimization program which includes automated exposure control, adjustment of the mA and/or kV according to patient size and/or use of iterative reconstruction technique. CONTRAST:  75mL OMNIPAQUE IOHEXOL 350 MG/ML SOLN COMPARISON:  Same day chest radiograph; CT chest 12/17/2022 and CT abdomen and pelvis 12/01/2022 FINDINGS: CTA CHEST FINDINGS Cardiovascular: Negative for acute pulmonary embolism. No pericardial effusion. Coronary artery and aortic atherosclerotic calcification. The ascending aorta is dilated measuring up to 44 mm. Left chest wall pacemaker. Mediastinum/Nodes: Trachea and esophagus are unremarkable. Shotty mediastinal and hilar lymph nodes are likely reactive. Lungs/Pleura: Emphysema. Diffuse bronchial wall thickening. No focal consolidation, pleural effusion, or pneumothorax. Interlobular septal thickening in the lower lobes. Musculoskeletal: No acute fracture. Review of the MIP images confirms the above findings. CT ABDOMEN and PELVIS FINDINGS Hepatobiliary: Cholecystectomy.  No acute abnormality. Pancreas: Marked atrophy with punctate calcifications in the residual pancreatic parenchyma of the head and uncinate process. There is adjacent peripancreatic stranding and trace fluid. No ductal dilation. Spleen: Splenectomy. Adrenals/Urinary Tract: Stable adrenal glands and kidneys. No urinary calculi or hydronephrosis. Bladder is unremarkable where not obscured by streak artifact. Stomach/Bowel: Postoperative change of partial colectomy. No bowel obstruction or bowel wall thickening. Stomach and appendix are within normal limits. Vascular/Lymphatic: Aortic atherosclerosis. No enlarged abdominal or pelvic lymph nodes. Reproductive: Unremarkable. Other: No free intraperitoneal air. No abscess  or organized fluid collection. Musculoskeletal: Right THA. No acute fracture. Remote fractures of the bilateral pubic rami. Posterior fusion L5-S1 Review of the MIP images confirms the above findings. IMPRESSION: 1. Negative for acute pulmonary embolism. 2. Emphysema and chronic bronchitis. 3. Acute on chronic pancreatitis. Aortic Atherosclerosis (ICD10-I70.0) and Emphysema (ICD10-J43.9). Electronically Signed   By: Minerva Fester M.D.   On: 01/13/2023 23:28   CT ABDOMEN PELVIS W CONTRAST Result Date: 01/13/2023 CLINICAL DATA:  Abdominal pain, hypotension, PE suspected EXAM: CT ANGIOGRAPHY CHEST CT ABDOMEN AND PELVIS WITH CONTRAST TECHNIQUE: Multidetector CT imaging of the chest was performed using the standard protocol during bolus administration of intravenous contrast. Multiplanar CT image reconstructions and MIPs were obtained to evaluate the vascular anatomy. Multidetector CT imaging of the abdomen and pelvis was performed using the standard protocol during bolus administration of intravenous contrast. RADIATION DOSE REDUCTION: This exam was performed according to the departmental dose-optimization  program which includes automated exposure control, adjustment of the mA and/or kV according to patient size and/or use of iterative reconstruction technique. CONTRAST:  75mL OMNIPAQUE IOHEXOL 350 MG/ML SOLN COMPARISON:  Same day chest radiograph; CT chest 12/17/2022 and CT abdomen and pelvis 12/01/2022 FINDINGS: CTA CHEST FINDINGS Cardiovascular: Negative for acute pulmonary embolism. No pericardial effusion. Coronary artery and aortic atherosclerotic calcification. The ascending aorta is dilated measuring up to 44 mm. Left chest wall pacemaker. Mediastinum/Nodes: Trachea and esophagus are unremarkable. Shotty mediastinal and hilar lymph nodes are likely reactive. Lungs/Pleura: Emphysema. Diffuse bronchial wall thickening. No focal consolidation, pleural effusion, or pneumothorax. Interlobular septal thickening  in the lower lobes. Musculoskeletal: No acute fracture. Review of the MIP images confirms the above findings. CT ABDOMEN and PELVIS FINDINGS Hepatobiliary: Cholecystectomy.  No acute abnormality. Pancreas: Marked atrophy with punctate calcifications in the residual pancreatic parenchyma of the head and uncinate process. There is adjacent peripancreatic stranding and trace fluid. No ductal dilation. Spleen: Splenectomy. Adrenals/Urinary Tract: Stable adrenal glands and kidneys. No urinary calculi or hydronephrosis. Bladder is unremarkable where not obscured by streak artifact. Stomach/Bowel: Postoperative change of partial colectomy. No bowel obstruction or bowel wall thickening. Stomach and appendix are within normal limits. Vascular/Lymphatic: Aortic atherosclerosis. No enlarged abdominal or pelvic lymph nodes. Reproductive: Unremarkable. Other: No free intraperitoneal air. No abscess or organized fluid collection. Musculoskeletal: Right THA. No acute fracture. Remote fractures of the bilateral pubic rami. Posterior fusion L5-S1 Review of the MIP images confirms the above findings. IMPRESSION: 1. Negative for acute pulmonary embolism. 2. Emphysema and chronic bronchitis. 3. Acute on chronic pancreatitis. Aortic Atherosclerosis (ICD10-I70.0) and Emphysema (ICD10-J43.9). Electronically Signed   By: Minerva Fester M.D.   On: 01/13/2023 23:28   DG Chest 2 View Result Date: 01/13/2023 CLINICAL DATA:  SOB chronic cough EXAM: CHEST - 2 VIEW COMPARISON:  12/28/2022. FINDINGS: Cardiac silhouette is enlarged. Lungs are hyperinflated suggesting COPD. There is no focal consolidation. No pneumothorax or pleural effusion. Aorta is calcified. There are thoracic degenerative changes. There is a left-sided pacer. IMPRESSION: Findings suggest COPD.  Otherwise no acute cardiopulmonary disease. Electronically Signed   By: Layla Maw M.D.   On: 01/13/2023 20:20   CUP PACEART INCLINIC DEVICE CHECK Result Date:  01/13/2023 Wound check appointment. Steri-strips removed. Wound CDI. well healed. Normal device function. Device programmed at 3.5V programmed on for extra safety margin until 3 month visit. No mode switches or high ventricular rates noted. Patient educated about wound care, arm mobility, lifting restrictions. ROV in 3 months with implanting physician. Decreased rate from 90 to 80 post AVN ablation. Pt c/o hypoTN, SOB, fatigue, BLE edema. BP checked twice in each arm and consistently low 70's/50's. [employer] [facility] assessed pt and med list. D/c losartan and metop tartrate. Started metop succinate. Appointment on 12/27 w/ [facility]. 12/30  w/ [facility] in [place]. Notified WC as FYI. Update: after reviewing chart, called pt and advised he go to [facility] ED. Notifed WC. Pt agreeable.Raj Janus, RN  DG Chest 2 View Result Date: 12/28/2022 CLINICAL DATA:  Pacemaker placement. EXAM: CHEST - 2 VIEW COMPARISON:  03/01/2022. FINDINGS: Left anterior chest wall single lead pacemaker, leads projecting along the posterior aspect of the right ventricle. No pneumothorax. Cardiac silhouette top-normal in size. No mediastinal or hilar masses. Lungs demonstrate vascular congestion, interstitial thickening and hazy airspace opacities consistent with pulmonary edema. Probable small effusions, not well-defined. IMPRESSION: 1. Left anterior chest wall single lead pacemaker, lead projecting in the right ventricle. 2. Congestive heart  failure. Electronically Signed   By: Amie Portland M.D.   On: 12/28/2022 11:10   EP STUDY Result Date: 12/27/2022 SURGEON: Loman Brooklyn, MD PREPROCEDURE DIAGNOSES: 1. Nonischemic cardiomyopathy. 2. New York Heart Association class III, heart failure chronically. 3.  Atrial fibrillation POSTPROCEDURE DIAGNOSES: 1. Nonischemic cardiomyopathy. 2. New York Heart Association class III heart failure chronically. 3.  Atrial fibrillation PROCEDURES: 1. Biventricular pacemaker implantation. INTRODUCTION:   Steve Anthony is a 72 y.o. male with a nonischemic CM (EF 35 to 40% %), and persistent atrial fibrillation, now thought to permanent.  He presents today for biventricular pacemaker insertion and AV node ablation. DESCRIPTION OF PROCEDURE: Informed written consent was obtained and the patient was brought to the electrophysiology lab in the fasting state. The patient was adequately sedated with intravenous Versed, and fentanyl as outlined in the nursing report. The patient's left chest was prepped and draped in the usual sterile fashion by the EP lab staff. The skin overlying the left deltopectoral region was infiltrated with lidocaine for local analgesia. A 5-cm incision was made over the left deltopectoral region. A left subcutaneous defibrillator pocket was fashioned using a combination of sharp and blunt dissection. Electrocautery was used to assure hemostasis. RA/RV Lead Placement: The left axillary vein was cannulated with fluoroscopic visualization. No contrast was required for this endeavor. Through the left axillary vein, a Abbott Ultipace [id number] (serial number [id number] [id number]) right ventricular pacing lead was advanced with fluoroscopic visualization into the left bundle area. Initial right ventricular lead R-wave measured 9.2 mV with impedance of 740 ohms and a threshold of 0.5 volts at 0.5 milliseconds. LV Lead Placement: A Abbott 135 guide was advanced through the left axillary vein into the low lateral right atrium. A Bard curved Damato catheter was introduced through the guide and used to cannulate the coronary sinus. Coronary sinus cannulation was confirmed with electrogram recording from the hexapolar catheter. A selective coronary sinus venogram was performed by hand injection of nonionic contrast. This demonstrated a large CS body with very small/ atretic distal branches. There was a moderate sized lateral coronary sinus branch was noted along the mid portion of the CS body. No other posterior branches  were identified. A Whisper CSJ wire was introduced through the guide and advanced into the distal posterolateral branch. A Abbott Quartet [id number] (serial number [id number]) lead was advanced through the guide into the lateral branch. This was approximately one-thirds from the base to the apex in a very lateral position. In this location, the left ventricular lead R-waves measured 12 mV with impedance of 690 ohms and a threshold of 1 volt at 0.5 milliseconds in the bipolar LV2-LV3 configuration with no diaphragmatic stimulation observed when pacing at 10 volts output. The guide was therefore removed. All three leads were secured to the pectoralis fascia using #2 silk suture over the suture sleeves. The pocket then irrigated with copious gentamicin solution. The leads were then connected to a [employer] MP [id number]  (serial Number [id number]) device.  The atrial port was plugged.  The pacemaker was placed into the pocket. The pocket was then closed in 3 layers with 2.0 Vicryl suture for the subcutaneous and 3.0 Vicryl suture subcuticular layers. Steri-Strips and a sterile dressing were then applied. DFT testing was not performed today. The procedure was therefore considered completed. EBL<53ml. There were no early apparhent complications. CONCLUSIONS: 1. Nonischemic cardiomyopathy with rapid atrial fibrillation 2. Successful biventricular pacemaker implantation. 3. No early apparent complications. During this procedure medications were administered to  achieve and maintain moderate conscious sedation while the patient's heart rate, blood pressure, and oxygen saturation were continuously monitored and I was present face-to-face 100% of this time. SURGEON: Will Camnitz, MD PREPROCEDURE DIAGNOSIS: atrial fibrillation POSTPROCEDURE DIAGNOSIS: atrial fibrillation PROCEDURES: 1. AV nodal ablation  INTRODUCTION: JAYC GOLASZEWSKI is a 72 y.o. male with symptomatic atrial fibrillation uncontrolled with medication. He presents  today for EP study and radiofrequency ablation. DESCRIPTION OF PROCEDURE: Informed written consent was obtained, and the patient was brought to the Electrophysiology Lab in the fasting state. The patient was sedated with IV Versed and Fentanyl. The patient's right groin was prepped and draped in the usual sterile fashion by the EP lab staff. Using a modified Seldinger technique, one 6, and one 8-French hemostasis sheaths were placed in the right common femoral vein.  A 6-French quadripolar Josephson catheter was introduced through the right common femoral vein and advanced into the right ventricle for recording and pacing. Direct ultrasound guidance is used for right femoral vein with normal vessel patency. Ultrasound images are captured and stored in the patient's chart. Using ultrasound guidance, the Brockenbrough needle and wire were visualized entering the vessel. Mapping: A Nature conservation officer II XB 8-mm ablation catheter was introduced through the right common femoral vein and advanced into the right atrium.  The catheter was positioned superior and septal along the tricuspid valve.  Ablation: The ablation catheter was therefore positioned along the cavotricuspid isthmus and radiofrequency ablation was delivered with a target temperature of 60 degrees of 60 watts for 120 seconds each.  The tachycardia slowed and then terminated during radiofrequency application with pacing as the underlying ventricular rhythm.   The patient was observed for 30 minutes without return of conduction through the AV node.  CONCLUSIONS: 1. Atrial fibrillation 2. Successful radiofrequency ablation of AV node 3. No early apparent complications. Loman Brooklyn, MD 5:28 PM 12/27/2022  EP PPM/ICD IMPLANT Result Date: 12/27/2022 SURGEON: Loman Brooklyn, MD PREPROCEDURE DIAGNOSES: 1. Nonischemic cardiomyopathy. 2. New [place] Heart Association class III, heart failure chronically. 3.  Atrial fibrillation POSTPROCEDURE DIAGNOSES: 1.  Nonischemic cardiomyopathy. 2. New [place] Heart Association class III heart failure chronically. 3.  Atrial fibrillation PROCEDURES: 1. Biventricular pacemaker implantation. INTRODUCTION:  ELIBERTO STEURER is a 72 y.o. male with a nonischemic CM (EF 35 to 40% %), and persistent atrial fibrillation, now thought to permanent.  He presents today for biventricular pacemaker insertion and AV node ablation. DESCRIPTION OF PROCEDURE: Informed written consent was obtained and the patient was brought to the electrophysiology lab in the fasting state. The patient was adequately sedated with intravenous Versed, and fentanyl as outlined in the nursing report. The patient's left chest was prepped and draped in the usual sterile fashion by the EP lab staff. The skin overlying the left deltopectoral region was infiltrated with lidocaine for local analgesia. A 5-cm incision was made over the left deltopectoral region. A left subcutaneous defibrillator pocket was fashioned using a combination of sharp and blunt dissection. Electrocautery was used to assure hemostasis. RA/RV Lead Placement: The left axillary vein was cannulated with fluoroscopic visualization. No contrast was required for this endeavor. Through the left axillary vein, a Abbott Ultipace 1231-65 (serial number EHL [id number]) right ventricular pacing lead was advanced with fluoroscopic visualization into the left bundle area. Initial right ventricular lead R-wave measured 9.2 mV with impedance of 740 ohms and a threshold of 0.5 volts at 0.5 milliseconds. LV Lead Placement: A Abbott 135 guide was advanced through the left axillary vein  into the low lateral right atrium. A Bard curved Damato catheter was introduced through the guide and used to cannulate the coronary sinus. Coronary sinus cannulation was confirmed with electrogram recording from the hexapolar catheter. A selective coronary sinus venogram was performed by hand injection of nonionic contrast. This demonstrated a  large CS body with very small/ atretic distal branches. There was a moderate sized lateral coronary sinus branch was noted along the mid portion of the CS body. No other posterior branches were identified. A Whisper CSJ wire was introduced through the guide and advanced into the distal posterolateral branch. A Abbott Quartet [id number] (serial number [id number]) lead was advanced through the guide into the lateral branch. This was approximately one-thirds from the base to the apex in a very lateral position. In this location, the left ventricular lead R-waves measured 12 mV with impedance of 690 ohms and a threshold of 1 volt at 0.5 milliseconds in the bipolar LV2-LV3 configuration with no diaphragmatic stimulation observed when pacing at 10 volts output. The guide was therefore removed. All three leads were secured to the pectoralis fascia using #2 silk suture over the suture sleeves. The pocket then irrigated with copious gentamicin solution. The leads were then connected to a [employer] MP [id number]  (serial Number [id number]) device.  The atrial port was plugged.  The pacemaker was placed into the pocket. The pocket was then closed in 3 layers with 2.0 Vicryl suture for the subcutaneous and 3.0 Vicryl suture subcuticular layers. Steri-Strips and a sterile dressing were then applied. DFT testing was not performed today. The procedure was therefore considered completed. EBL<64ml. There were no early apparhent complications. CONCLUSIONS: 1. Nonischemic cardiomyopathy with rapid atrial fibrillation 2. Successful biventricular pacemaker implantation. 3. No early apparent complications. During this procedure medications were administered to achieve and maintain moderate conscious sedation while the patient's heart rate, blood pressure, and oxygen saturation were continuously monitored and I was present face-to-face 100% of this time. SURGEON: Will Camnitz, MD PREPROCEDURE DIAGNOSIS: atrial fibrillation POSTPROCEDURE  DIAGNOSIS: atrial fibrillation PROCEDURES: 1. AV nodal ablation  INTRODUCTION: Steve Anthony is a 72 y.o. male with symptomatic atrial fibrillation uncontrolled with medication. He presents today for EP study and radiofrequency ablation. DESCRIPTION OF PROCEDURE: Informed written consent was obtained, and the patient was brought to the Electrophysiology Lab in the fasting state. The patient was sedated with IV Versed and Fentanyl. The patient's right groin was prepped and draped in the usual sterile fashion by the EP lab staff. Using a modified Seldinger technique, one 6, and one 8-French hemostasis sheaths were placed in the right common femoral vein.  A 6-French quadripolar Josephson catheter was introduced through the right common femoral vein and advanced into the right ventricle for recording and pacing. Direct ultrasound guidance is used for right femoral vein with normal vessel patency. Ultrasound images are captured and stored in the patient's chart. Using ultrasound guidance, the Brockenbrough needle and wire were visualized entering the vessel. Mapping: A Nature conservation officer II XB 8-mm ablation catheter was introduced through the right common femoral vein and advanced into the right atrium.  The catheter was positioned superior and septal along the tricuspid valve.  Ablation: The ablation catheter was therefore positioned along the cavotricuspid isthmus and radiofrequency ablation was delivered with a target temperature of 60 degrees of 60 watts for 120 seconds each.  The tachycardia slowed and then terminated during radiofrequency application with pacing as the underlying ventricular rhythm.   The patient was observed for  30 minutes without return of conduction through the AV node.  CONCLUSIONS: 1. Atrial fibrillation 2. Successful radiofrequency ablation of AV node 3. No early apparent complications. Loman Brooklyn, MD 5:28 PM 12/27/2022  CT Chest Wo Contrast Result Date: 12/17/2022 CLINICAL DATA:   Follow-up ascending thoracic aortic aneurysm. Follow-up left upper lobe nodularity seen on prior CT scan. EXAM: CT CHEST WITHOUT CONTRAST TECHNIQUE: Multidetector CT imaging of the chest was performed following the standard protocol without IV contrast. RADIATION DOSE REDUCTION: This exam was performed according to the departmental dose-optimization program which includes automated exposure control, adjustment of the mA and/or kV according to patient size and/or use of iterative reconstruction technique. COMPARISON:  Multiple prior CT scans.  The most recent is 08/04/2022 FINDINGS: Cardiovascular: The heart is normal in size. No pericardial effusion. Stable 4.3 cm fusiform aneurysmal dilatation of the ascending thoracic aorta. Recommend annual imaging followup by CTA or MRA. This recommendation follows 2010 ACCF/AHA/AATS/ACR/ASA/SCA/SCAI/SIR/STS/SVM Guidelines for the Diagnosis and Management of Patients with Thoracic Aortic Disease. Circulation. 2010; 121: J191-Y782. Aortic aneurysm NOS (ICD10-I71.9). Stable scattered aortic and three-vessel coronary artery calcifications and LAD stent. Mediastinum/Nodes: Persistent scattered borderline mediastinal and hilar lymph nodes. No new progressive findings. The esophagus is grossly normal. The thyroid gland is unremarkable. Lungs/Pleura: New moderate-sized bilateral pleural effusions. Associated overlying atelectasis. The medial left upper lobe nodularity has resolved and was likely inflammation or atelectasis. Persistent and slight worsening of lingular atelectasis and scarring. Stable underlying emphysematous changes and areas of pulmonary scarring. Stable 3 mm right middle lobe pulmonary nodule on image 96/5. Stable small scattered calcified granulomas. Upper Abdomen: No acute upper abdominal findings. Status post splenectomy. Simple bilateral renal cysts. Stable vascular calcifications. Mild diffuse body wall edema. Musculoskeletal: No significant bony findings.  Stable degenerative changes involving the thoracic spine. IMPRESSION: 1. Stable 4.3 cm fusiform aneurysmal dilatation of the ascending thoracic aorta. 2. New moderate-sized bilateral pleural effusions with associated overlying atelectasis. 3. The medial left upper lobe nodularity has resolved and was likely inflammation or atelectasis. 4. Stable 3 mm right middle lobe pulmonary nodule. 5. Stable scattered borderline mediastinal and hilar lymph nodes. 6. Stable underlying emphysematous changes and areas of pulmonary scarring and calcified granulomas. 7. Stable aortic and three-vessel coronary artery calcifications and LAD stent. Aortic Atherosclerosis (ICD10-I70.0) and Emphysema (ICD10-J43.9). Electronically Signed   By: Rudie Meyer M.D.   On: 12/17/2022 11:06     Subjective: Patient seen examined bedside, sitting at edge of bed in ED holding area.  Spouse present at bedside.  Discussed findings of CT scan to include acute on chronic pancreatitis and recommendation of IV fluids, clear liquid diet.  He does not want to stay in the hospital as he cannot "perform these treatments at home".  Currently tolerating diet, denies any current nausea or abdominal pain.  Blood pressure also improved, lactic acidosis has resolved.  Discussed with patient and spouse to discontinue metformin and Jardiance is likely contributing factor to his ketones in the urine as well as his lactic acidosis given poor oral intake.  No other specific questions, concerns or complaints at this time.  Denies headache, no dizziness, no chest pain, no palpitations, no shortness of breath greater than his baseline, no fever/chills/night sweats, no nausea/vomiting/diarrhea, no focal weakness, no fatigue, no paresthesias.  No acute events overnight per nurse staff.  Discharge Exam: Vitals:   01/14/23 0630 01/14/23 0700  BP: 121/81 117/78  Pulse: 80 80  Resp: 15 12  Temp:    SpO2: 100% 98%  Vitals:   01/14/23 0546 01/14/23 0600 01/14/23  0630 01/14/23 0700  BP:  126/85 121/81 117/78  Pulse:  80 80 80  Resp:  16 15 12   Temp: (S) 98.3 F (36.8 C)     TempSrc: Oral     SpO2:  99% 100% 98%  Weight:        Physical Exam: GEN: NAD, alert and oriented x 3, chronically ill appearance, appears older than stated age HEENT: NCAT, PERRL, EOMI, sclera clear, MMM PULM: CTAB w/o wheezes/crackles, normal respiratory effort, on 2 L nasal, which is his baseline CV: RRR w/o M/G/R GI: abd soft, NTND, NABS, no R/G/M MSK: no peripheral edema, muscle strength globally intact 5/5 bilateral upper/lower extremities NEURO: CN II-XII intact, no focal deficits, sensation to light touch intact PSYCH: Depressed mood, flat affect Integumentary: dry/intact, no rashes or wounds    The results of significant diagnostics from this hospitalization (including imaging, microbiology, ancillary and laboratory) are listed below for reference.     Microbiology: No results found for this or any previous visit (from the past 240 hours).   Labs: BNP (last 3 results) Recent Labs    01/13/23 1634  BNP 721.6*   Basic Metabolic Panel: Recent Labs  Lab 01/13/23 1634 01/13/23 1640 01/14/23 0000  NA 137  --  139  K 3.7  --  3.5  CL 102  --  103  CO2 13*  --  18*  GLUCOSE 158*  --  127*  BUN 7*  --  5*  CREATININE 1.47*  --  1.09  CALCIUM 8.7*  --  8.1*  MG  --  1.3* 1.2*   Liver Function Tests: Recent Labs  Lab 01/13/23 1634 01/14/23 0000  AST 22 15  ALT 14 12  ALKPHOS 48 36*  BILITOT 1.3* 1.3*  PROT 5.4* 4.3*  ALBUMIN 2.7* 2.1*   No results for input(s): "LIPASE", "AMYLASE" in the last 168 hours. No results for input(s): "AMMONIA" in the last 168 hours. CBC: Recent Labs  Lab 01/13/23 1634 01/14/23 0000  WBC 6.0 6.7  NEUTROABS 4.4 4.2  HGB 13.2 11.1*  HCT 39.0 32.4*  MCV 91.8 91.0  PLT 252 211   Cardiac Enzymes: Recent Labs  Lab 01/13/23 1634  CKTOTAL 97   BNP: Invalid input(s): "POCBNP" CBG: Recent Labs  Lab  01/14/23 0016 01/14/23 0727  GLUCAP 93 99   D-Dimer No results for input(s): "DDIMER" in the last 72 hours. Hgb A1c No results for input(s): "HGBA1C" in the last 72 hours. Lipid Profile No results for input(s): "CHOL", "HDL", "LDLCALC", "TRIG", "CHOLHDL", "LDLDIRECT" in the last 72 hours. Thyroid function studies No results for input(s): "TSH", "T4TOTAL", "T3FREE", "THYROIDAB" in the last 72 hours.  Invalid input(s): "FREET3" Anemia work up No results for input(s): "VITAMINB12", "FOLATE", "FERRITIN", "TIBC", "IRON", "RETICCTPCT" in the last 72 hours. Urinalysis    Component Value Date/Time   COLORURINE YELLOW 01/13/2023 1936   APPEARANCEUR CLEAR 01/13/2023 1936   LABSPEC 1.027 01/13/2023 1936   PHURINE 5.0 01/13/2023 1936   GLUCOSEU >=500 (A) 01/13/2023 1936   HGBUR NEGATIVE 01/13/2023 1936   BILIRUBINUR NEGATIVE 01/13/2023 1936   KETONESUR 5 (A) 01/13/2023 1936   PROTEINUR NEGATIVE 01/13/2023 1936   NITRITE NEGATIVE 01/13/2023 1936   LEUKOCYTESUR NEGATIVE 01/13/2023 1936   Sepsis Labs Recent Labs  Lab 01/13/23 1634 01/14/23 0000  WBC 6.0 6.7   Microbiology No results found for this or any previous visit (from the past 240 hours).   Time coordinating  discharge: Over 30 minutes  SIGNED:   Alvira Philips [place], DO  [facility] 01/14/2023, 8:32 AM

## 2023-01-18 LAB — CULTURE, BLOOD (ROUTINE X 2)
Culture: NO GROWTH
Culture: NO GROWTH
Special Requests: ADEQUATE

## 2023-01-21 ENCOUNTER — Ambulatory Visit: Payer: Medicare HMO

## 2023-01-21 VITALS — BP 82/54 | HR 80 | Ht 72.0 in | Wt 190.8 lb

## 2023-01-21 DIAGNOSIS — I5042 Chronic combined systolic (congestive) and diastolic (congestive) heart failure: Secondary | ICD-10-CM | POA: Diagnosis not present

## 2023-01-21 DIAGNOSIS — I2510 Atherosclerotic heart disease of native coronary artery without angina pectoris: Secondary | ICD-10-CM

## 2023-01-21 DIAGNOSIS — I959 Hypotension, unspecified: Secondary | ICD-10-CM | POA: Diagnosis not present

## 2023-01-21 DIAGNOSIS — I4821 Permanent atrial fibrillation: Secondary | ICD-10-CM

## 2023-01-21 NOTE — Assessment & Plan Note (Signed)
Remains on Eliquis for anticoagulation. Did not tolerate statins in the past. Will hold off on escalating therapy at this time given ongoing hypotension and weakness.

## 2023-01-21 NOTE — Assessment & Plan Note (Signed)
Rates now improved post AV nodal ablation and pacemaker implant. Heart rate set to 60/min. Doing well. Remains on anticoagulation with Eliquis.  Continues to follow-up with EP and device clinic.

## 2023-01-21 NOTE — Progress Notes (Signed)
Cardiology Consultation:    Date:  01/21/2023   ID:  Steve Anthony, DOB [birth year], MRN [id number]  PCP:  Steve Payment., MD  Cardiologist:  Luretha Murphy, MD   Referring MD: Steve Payment., MD   No chief complaint on file.    ASSESSMENT AND PLAN:   Steve Anthony 72 year old male with history of CAD, CHF with reduced LVEF 35 to 40%, sick sinus syndrome, permanent A-fib failed rhythm control and difficult rate control s/p AV nodal ablation and BiV pacemaker implant 12/27/2022, diabetes mellitus, hyperlipidemia, COPD, CKD stage III, dilated ascending aorta 44 mm stable in comparison to prior CT imaging from 2022, with recent hospitalization on 01/13/2023 for hypotension and elevated lactate levels in the setting of reduced oral intake, was felt secondary to starvation ketosis and ongoing treatment with metformin and Jardiance and mild features of acute on chronic pancreatitis on CT abdomen imaging, with stable hemodynamics he was discharged home for follow-up.    Problem List Items Addressed This Visit     CAD  s/p PCI of Mid LAD 08/2004, Cath 06/2009 unchagned, recent CTA 2022 with severe LCX disease vs artifact; followed by Stress nuclear test, no ischemia 10/2020   Remains on Eliquis for anticoagulation. Did not tolerate statins in the past. Will hold off on escalating therapy at this time given ongoing hypotension and weakness.      Permanent atrial fibrillation (HCC), s/p ablation 2011; failed rhythm control; persistent rapid rates, s/p AV nodal ablation and BiV permanent pacemaker implant 12/27/2022   Rates now improved post AV nodal ablation and pacemaker implant. Heart rate set to 60/min. Doing well. Remains on anticoagulation with Eliquis.  Continues to follow-up with EP and device clinic.       CHF (congestive heart failure) (HCC), reduced LVEF 35 to 40% by TTE 11/19/2022, new decrease compared to prior echocardiogram 2022.  Likely tachycardia mediated in the setting  of A-fib RVR   Now s/p AV nodal ablation and permanent pacemaker implant December 2024. Appears hypervolemic with bilateral lower extremity edema.  Continue salt restriction to below 2 g/day and fluid restriction to below 2 L/day. Continue to weigh daily.  Take furosemide 40 mg as needed for weight gain over 2 pounds within a day or 4 pounds within a week.  Off all guideline directed medical therapy for cardiomyopathy given low blood pressures. Blood pressure rechecked today using left upper arm, systolic 90 mmHg and diastolic 52 mmHg. Dimension readings at home fluctuate from systolic 90s  to 409W.  Will follow-up closely. Repeat limited echocardiogram to assess any significant change in LV function since the last check in October.  My concern is if his hypotension is related to worsening LV dysfunction and he is in a preshock state. Advised him and his wife to head to ER or call 911 if his symptoms seem to be worsening with worsening fatigue or tiredness or increased sleepiness.   Since the AV nodal ablation and pacemaker implant his breathing is significantly improved and he is off oxygen requirements saturating 100% on room air.      Other Visit Diagnoses       Hypotension, unspecified hypotension type    -  Primary   Relevant Orders   ECHOCARDIOGRAM COMPLETE      Return to clinic in 1 month, will obtain an echocardiogram, tentatively prior to the visit.   History of Present Illness:    Steve Anthony is a 72 y.o. male who is being seen  today for follow-up visit.   Last visit in the office with me was 10/18/2022 PCP Steve Payment., MD.   Has history of CAD s/p PCI of mid LAD in 2006, chronic heart failure with reduced systolic function LVEF 35 to 40% on echocardiogram 11/19/2022 and moderately reduced RV function, sick sinus syndrome, permanent A-fib with difficult rate control s/p AV node ablation and BiV pacemaker implant 12/27/2022, on anticoagulation for stroke  prophylaxis, diabetes mellitus, hyperlipidemia, COPD/chronic hypoxic respiratory failure on 2 L nasal cannula at home, CKD stage III, dilated ascending aorta 44 mm, similar in comparison to prior CT imaging from 2022 and measured 43 mm   Has had follow-up visit with Dr. Elberta Fortis and underwent AV nodal ablation and BiV pacemaker implant 12/27/2022.  Recently follow-up visit 01/13/2023 with wound clinic Steri-Strips were removed he was noted to be hypotensive, was sent to the ER at [facility].  This was in the setting of him not having good appetite over the preceding few days, feeling fatigued.  His lactate was elevated at 4.5 and repeat lactate elevated to 8.5.  Lipase was mildly elevated 75.  CT chest showed no evidence of PE, no acute cardiopulmonary issues.  CT abdomen and pelvis findings concerning for acute on chronic pancreatitis for which she received IV antibiotics.  Symptoms were felt to be related to hypovolemia and dehydration resulting in starvation ketosis and lactic acidosis with ongoing use of Jardiance and metformin.  Metformin and Jardiance were discontinued.  He was discharged home on 01-14-2023 at patient's request and recommended to follow-up with PCP and cardiologist as outpatient.  He apparently wanted to be out of the hospital to be with his elderly mom in her 90s who was terminal and she has since passed and they had her funeral yesterday.  He is here accompanied for the visit by his wife. Mentions over the past week he has been doing relatively stable at home, blood pressures have been fluctuating with the top number systolic from 90s to 100s. Denies any lightheadedness or syncopal episodes.  Denies any falls.  Denies any chest pain.  Denies any worsening shortness of breath.  Mild lower extremity edema.  Has taken furosemide 40 mg dose on 2 separate days in the past 1 week.   Past Medical History:  Diagnosis Date   Abnormal CT of the abdomen 05/27/2018   Abnormal MRI of  abdomen 04/14/2018   Acute pancreatitis 02/20/2019   Anemia    Anxiety disorder 04/09/2015   Arthritis    Asthma    Atrial fibrillation (HCC) 11/21/2008   Qualifier: Diagnosis of   By: Ladona Ridgel, MD, Jerrell Mylar        CAD (coronary artery disease)    a. S/P PCI mid LAD (vision stent) 08/31/2004;  b. repeat cath 6/11 - no progression of CAD. c. Cath 10/2012 given moderate CAD on cardiac CT - no change from prior cath.   Centrilobular emphysema (HCC) 10/08/2022   Chest CT 08/04/2022     Chest pain 12/16/2016   CHEST PAIN UNSPECIFIED 11/21/2008   Qualifier: Diagnosis of   By: Remer Macho, EMT-P, Tara         CHF (congestive heart failure) (HCC), reduced LVEF 35 to 40% by TTE 11/19/2022, new decrease compared to prior echocardiogram 2022.  Likely tachycardia mediated in the setting of A-fib RVR 12/17/2022   Chronic respiratory failure with hypoxia (HCC) 02/24/2017   Chronic systolic CHF (congestive heart failure) (HCC) 01/14/2023   Cirrhosis of liver without  ascites (HCC) 04/14/2018   CKD (chronic kidney disease), stage III (HCC) 02/20/2019   COPD (chronic obstructive pulmonary disease) (HCC)    COPD without exacerbation (HCC) 05/15/2009   Qualifier: Diagnosis of   By: Vassie Loll MD, Comer Locket.         COVID-19 virus infection    Cyst of nipple, right 11/03/2020   Degeneration of lumbar intervertebral disc 04/09/2015   Diverticulitis    Dyslipidemia    Essential hypertension 10/15/2008   Qualifier: Diagnosis of   By: Flonnie Overman         Gallstones    GERD 10/15/2008   Qualifier: Diagnosis of   By: Flonnie Overman         High risk medication use 04/09/2015   History of COVID-19 01/24/19 02/20/2019   History of kidney stones    History of pelvic fracture 01/01/2020   History of tattoo 08/23/2016   History of UTI 11/28/2019   Hypercoagulable state due to persistent atrial fibrillation (HCC) 03/26/2022   Hyperlipidemia 10/15/2008   Qualifier: Diagnosis of   By: Flonnie Overman          Hypertension    Hyperthyroidism    HYPERTRIGLYCERIDEMIA 10/15/2008   Qualifier: Diagnosis of   By: Flonnie Overman         Hypotension 06/13/2019   Idiopathic acute pancreatitis 04/14/2018   Incisional hernia, without obstruction or gangrene 02/05/2016   Infected pancreatic pseudocyst 06/07/2019   Insomnia 04/09/2015   Iron deficiency anemia 05/03/2022   Kidney mass 07/06/2017   Following with urology. Chao.  Recommend follow up. Last seen 01/2018  Refuses to see again.   CT okay 04/2021     Long term (current) use of anticoagulants [Z79.01] 06/10/2015   Melanoma (HCC)    melanoma removed from neck    Mild episode of recurrent major depressive disorder (HCC) 08/27/2015   NEPHROLITHIASIS 10/15/2008   Qualifier: Diagnosis of   By: Flonnie Overman         OA (osteoarthritis) of hip 12/18/2014   On home oxygen therapy    "2L q hs" (11/08/2012),  06/04/19 pt. not on oxygen   OSA (obstructive sleep apnea)    a. cpap noncompliance- patient reports on 12/10/14- does not use CPAP   Palpitations    a. 10/2012 - s/p LINQ loop recorder to assess for arrhythmia.    Pancreatic cyst 04/14/2018   Pancreatic pseudocyst 06/07/2019   Pancreatitis    Paroxysmal A-fib (HCC) 11/21/2008   a. S/P PVI 2/11 and 9/11   Paroxysmal atrial fibrillation (HCC) 01/14/2023   Pelvic fracture (HCC) 12/31/2019   Persistent atrial fibrillation (HCC) 06/08/2022   PICC (peripherally inserted central catheter) in place    Pneumonia    Protein-calorie malnutrition, severe 06/12/2019   Pseudocyst of pancreas 03/06/2019   Pure hypercholesterolemia    S/P coronary artery stent placement    SHORTNESS OF BREATH 03/19/2009   Qualifier: Diagnosis of   By: Johney Frame MD, James         Sleep apnea 05/17/2009   Qualifier: Diagnosis of   By: Vassie Loll MD, Comer Locket.         SVT (supraventricular tachycardia) (HCC)    a. Atypical AVNRT of the slow pathway   Thoracic aortic aneurysm (HCC) 11/13/2020   Type II diabetes mellitus (HCC)     Unstable angina (HCC) 10/15/2008   Qualifier: Diagnosis of   By: Flonnie Overman          Past Surgical History:  Procedure Laterality Date  ATRIAL ABLATION SURGERY  2007   S/P slow pathway ablation for atypical AVNRT    AV NODE ABLATION N/A 12/27/2022   Procedure: AV NODE ABLATION;  Surgeon: Regan Lemming, MD;  Location: [facility];  Service: Cardiovascular;  Laterality: N/A;   BIOPSY  07/17/2018   Procedure: BIOPSY;  Surgeon: Meridee Score Netty Starring., MD;  Location: [facility] ENDOSCOPY;  Service: Gastroenterology;;   BIV PACEMAKER INSERTION CRT-P N/A 12/27/2022   Procedure: BIV PACEMAKER INSERTION CRT-P;  Surgeon: Regan Lemming, MD;  Location: [facility] INVASIVE CV LAB;  Service: Cardiovascular;  Laterality: N/A;   CARDIOVERSION N/A 04/13/2022   Procedure: CARDIOVERSION;  Surgeon: Chrystie Nose, MD;  Location: [facility] ENDOSCOPY;  Service: Cardiovascular;  Laterality: N/A;   CARDIOVERSION N/A 06/10/2022   Procedure: CARDIOVERSION;  Surgeon: Wendall Stade, MD;  Location: [facility] INV[facility] CV LAB;  Service: Cardiovascular;  Laterality: N/A;   CHOLECYSTECTOMY  ~ 2008   COLON RESECTION  02/2013    COLONOSCOPY  12/25/2007   Colonic polyp status post polypectomy. Internal hemorrhoids.    CORONARY ANGIOPLASTY WITH STENT PLACEMENT  08/30/2004   Vision; to LAD    CYSTOSCOPY W/ STONE MANIPULATION  ~ 1998   "once" (11/08/2012)   EP Study  2008   Negative EP study in [place]   ESOPHAGOGASTRODUODENOSCOPY (EGD) WITH PROPOFOL N/A 07/17/2018   Procedure: ESOPHAGOGASTRODUODENOSCOPY (EGD) WITH PROPOFOL;  Surgeon: Lemar Lofty., MD;  Location: [facility] ENDOSCOPY;  Service: Gastroenterology;  Laterality: N/A;   FRACTURE SURGERY     LEFT HEART CATHETERIZATION WITH CORONARY ANGIOGRAM N/A 11/10/2012   Procedure: LEFT HEART CATHETERIZATION WITH CORONARY ANGIOGRAM;  Surgeon: Kathleene Hazel, MD;  Location: [facility] CATH LAB;  Service: Cardiovascular;  Laterality: N/A;   LOOP RECORDER IMPLANT N/A 11/10/2012    Medtronic LinQ implanted by Dr Johney Frame for afib management   LUMBAR DISC SURGERY  2000; 2002   POSTERIOR LUMBAR FUSION  2004   PROSTATE SURGERY  ~ 2009   PVI/ CTI ablation  02/25/2009 and 09/2009   S/P afib/ CTI ablation   SPLENECTOMY, TOTAL N/A 06/07/2019   Procedure: SPLENECTOMY;  Surgeon: Almond Lint, MD;  Location: [facility] OR;  Service: General;  Laterality: N/A;   TIBIA FRACTURE SURGERY Right 1990   "broke in 3 places; had rod put in" (11/08/2012)   TIBIA HARDWARE REMOVAL Right ~ 1991   TONSILLECTOMY  1965   TOTAL HIP ARTHROPLASTY Right 12/18/2014   Procedure: RIGHT TOTAL HIP ARTHROPLASTY ANTERIOR APPROACH;  Surgeon: Ollen Gross, MD;  Location: [facility] ORS;  Service: Orthopedics;  Laterality: Right;   UPPER ESOPHAGEAL ENDOSCOPIC ULTRASOUND (EUS) N/A 07/17/2018   Procedure: UPPER ESOPHAGEAL ENDOSCOPIC ULTRASOUND (EUS);  Surgeon: Lemar Lofty., MD;  Location: [facility] ENDOSCOPY;  Service: Gastroenterology;  Laterality: N/A;    Current Medications: Current Meds  Medication Sig   apixaban (ELIQUIS) 5 MG TABS tablet Take 1 tablet (5 mg total) by mouth 2 (two) times daily.   baclofen (LIORESAL) 10 MG tablet Take 10 mg by mouth every 8 (eight) hours as needed for muscle spasms.   docusate sodium (COLACE) 100 MG capsule Take 1 capsule (100 mg total) by mouth 2 (two) times daily as needed for mild constipation.   escitalopram (LEXAPRO) 10 MG tablet Take 10 mg by mouth daily.   ferrous sulfate 325 (65 FE) MG tablet Take 325 mg by mouth daily with breakfast.   furosemide (LASIX) 20 MG tablet Take 40 mg by mouth daily as needed for fluid or edema (as needed for 2  pound weight gain in 1 day or 4 pound weight gain in one week.).   gabapentin (NEURONTIN) 300 MG capsule Take 1 capsule (300 mg total) by mouth 3 (three) times daily.   Insulin Pen Needle 29G X MISC 1 Device by Does not apply route 3 (three) times daily as needed (for use with insulin pen).   magnesium oxide (MAG-OX) 400 MG tablet Take  1 tablet (400 mg total) by mouth daily.   Melatonin 10 MG CAPS Take 20 mg by mouth at bedtime.   Multiple Vitamins-Minerals (PRESERVISION AREDS) TABS Take 1 tablet by mouth 2 (two) times daily.   nitroGLYCERIN (NITROSTAT) 0.4 MG SL tablet Place 0.4 mg under the tongue every 5 (five) minutes as needed for chest pain.   Oxycodone HCl 10 MG TABS Take 1 tablet by mouth daily as needed (For pain). Take 1 tablet every 4-6 hours as needed   pantoprazole (PROTONIX) 40 MG tablet Take 1 tablet (40 mg total) by mouth 2 (two) times daily.   Tamsulosin HCl (FLOMAX) 0.4 MG CAPS Take 0.4 mg by mouth daily.   zolpidem (AMBIEN) 5 MG tablet Take 0.5 tablets (2.5 mg total) by mouth at bedtime as needed for sleep.     Allergies:   Penicillins and Levofloxacin   Social History   Socioeconomic History   Marital status: Married    Spouse name: Not on file   Number of children: 1   Years of education: Not on file   Highest education level: Not on file  Occupational History   Occupation: DISABLED    Employer: DISABLED   Occupation: Retired [employer]  Tobacco Use   Smoking status: Former    Current packs/day: 0.00    Average packs/day: 3.0 packs/day for 38.0 years (114.0 ttl pk-yrs)    Types: Cigarettes    Start date: 03/26/1966    Quit date: 03/25/2004    Years since quitting: 18.8   Smokeless tobacco: Former    Types: Chew    Quit date: 04/25/2004   Tobacco comments:    Former smoker 03/26/22  Vaping Use   Vaping status: Never Used  Substance and Sexual Activity   Alcohol use: Not Currently    Comment:  "quit drinking in 1989"   Drug use: No   Sexual activity: Not Currently  Other Topics Concern   Not on file  Social History Narrative   Resides in [place] with his wife   Two children and two grandchildren   Attends [employer]   Social Drivers of Health   Financial Resource Strain: Not on file  Food Insecurity: Low Risk  (10/08/2022)   Received from [facility]    Hunger Vital Sign    Worried About Running Out of Food in the Last Year: Never true    Ran Out of Food in the Last Year: Never true  Transportation Needs: No Transportation Needs (10/08/2022)   Received from [facility]    In the past 12 months, has lack of reliable transportation kept you from medical appointments, meetings, work or from getting things needed for daily living? : No  Physical Activity: Not on file  Stress: Not on file  Social Connections: Not on file     Family History: The patient's family history includes Diabetes in his father; Emphysema in his maternal grandfather and mother; Heart attack in his father; Heart disease in his father and paternal grandfather; Hypertension in his father. There is no history  of Colon cancer, Esophageal cancer, Inflammatory bowel disease, Liver disease, Pancreatic cancer, Rectal cancer, or Stomach cancer. ROS:   Please see the history of present illness.    All 14 point review of systems negative except as described per history of present illness.  EKGs/Labs/Other Studies Reviewed:    The following studies were reviewed today:   EKG:       Recent Labs: 06/11/2022: TSH 1.955 01/13/2023: B Natriuretic Peptide 721.6 01/14/2023: ALT 12; BUN 5; Creatinine, Ser 1.09; Hemoglobin 11.1; Magnesium 1.2; Platelets 211; Potassium 3.5; Sodium 139  Recent Lipid Panel    Component Value Date/Time   CHOL 113 02/20/2019 2042   TRIG 276 (H) 02/20/2019 2042   HDL 20 (L) 02/20/2019 2042   CHOLHDL 5.7 02/20/2019 2042   VLDL 55 (H) 02/20/2019 2042   LDLCALC 38 02/20/2019 2042    Physical Exam:    VS:  BP (!) 82/54   Pulse 80   Ht 6' (1.829 m)   Wt 190 lb 12.8 oz (86.5 kg)   SpO2 97%   BMI 25.88 kg/m    Blood pressure rechecked left upper arm 90/52 mmHg. Wt Readings from Last 3 Encounters:  01/21/23 190 lb 12.8 oz (86.5 kg)  01/13/23 195 lb (88.5 kg)  12/29/22 200 lb (90.7 kg)     GENERAL: Thin, lost weight since last  visit, no acute distress. NECK: No JVD; No carotid bruits CARDIAC: RRR, S1 and S2 present, no murmurs, no rubs, no gallops CHEST:  Clear to auscultation without rales, wheezing or rhonchi  Extremities: 1+ bilateral pitting pedal edema extending mid calves level. Pulses bilaterally symmetric with radial 2+ and dorsalis pedis 2+ NEUROLOGIC:  Alert and oriented x 3  Medication Adjustments/Labs and Tests Ordered: Current medicines are reviewed at length with the patient today.  Concerns regarding medicines are outlined above.  Orders Placed This Encounter  Procedures   ECHOCARDIOGRAM COMPLETE   No orders of the defined types were placed in this encounter.   Signed, Cecille Amsterdam, MD, MPH, [facility]. 01/21/2023 11:34 AM    [facility]

## 2023-01-21 NOTE — Assessment & Plan Note (Addendum)
Now s/p AV nodal ablation and permanent pacemaker implant December 2024. Appears hypervolemic with bilateral lower extremity edema.  Continue salt restriction to below 2 g/day and fluid restriction to below 2 L/day. Continue to weigh daily.  Take furosemide 40 mg as needed for weight gain over 2 pounds within a day or 4 pounds within a week.  Off all guideline directed medical therapy for cardiomyopathy given low blood pressures. Blood pressure rechecked today using left upper arm, systolic 90 mmHg and diastolic 52 mmHg. Dimension readings at home fluctuate from systolic 90s  to 062I.  Will follow-up closely. Repeat limited echocardiogram to assess any significant change in LV function since the last check in October.  My concern is if his hypotension is related to worsening LV dysfunction and he is in a preshock state. Advised him and his wife to head to ER or call 911 if his symptoms seem to be worsening with worsening fatigue or tiredness or increased sleepiness.   Since the AV nodal ablation and pacemaker implant his breathing is significantly improved and he is off oxygen requirements saturating 100% on room air.

## 2023-01-21 NOTE — Patient Instructions (Signed)
Medication Instructions:   Take Lasix 40 mg as needed for 2 pound weight gain in 1 day or 4 pound weight gain in one week.  Stop taking Metoprolol. *If you need a refill on your cardiac medications before your next appointment, please call your pharmacy*   Lab Work: None Ordered If you have labs (blood work) drawn today and your tests are completely normal, you will receive your results only by: MyChart Message (if you have MyChart) OR A paper copy in the mail If you have any lab test that is abnormal or we need to change your treatment, we will call you to review the results.   Testing/Procedures: Echocardiogram An echocardiogram is a test that uses sound waves (ultrasound) to produce images of the heart. Images from an echocardiogram can provide important information about: Heart size and shape. The size and thickness and movement of your heart's walls. Heart muscle function and strength. Heart valve function or if you have stenosis. Stenosis is when the heart valves are too narrow. If blood is flowing backward through the heart valves (regurgitation). A tumor or infectious growth around the heart valves. Areas of heart muscle that are not working well because of poor blood flow or injury from a heart attack. Aneurysm detection. An aneurysm is a weak or damaged part of an artery wall. The wall bulges out from the normal force of blood pumping through the body. Tell a health care provider about: Any allergies you have. All medicines you are taking, including vitamins, herbs, eye drops, creams, and over-the-counter medicines. Any blood disorders you have. Any surgeries you have had. Any medical conditions you have. Whether you are pregnant or may be pregnant. What are the risks? Generally, this is a safe test. However, problems may occur, including an allergic reaction to dye (contrast) that may be used during the test. What happens before the test? No specific preparation is  needed. You may eat and drink normally. What happens during the test?  You will take off your clothes from the waist up and put on a hospital gown. Electrodes or electrocardiogram (ECG)patches may be placed on your chest. The electrodes or patches are then connected to a device that monitors your heart rate and rhythm. You will lie down on a table for an ultrasound exam. A gel will be applied to your chest to help sound waves pass through your skin. A handheld device, called a transducer, will be pressed against your chest and moved over your heart. The transducer produces sound waves that travel to your heart and bounce back (or "echo" back) to the transducer. These sound waves will be captured in real-time and changed into images of your heart that can be viewed on a video monitor. The images will be recorded on a computer and reviewed by your health care provider. You may be asked to change positions or hold your breath for a short time. This makes it easier to get different views or better views of your heart. In some cases, you may receive contrast through an IV in one of your veins. This can improve the quality of the pictures from your heart. The procedure may vary among health care providers and hospitals. What can I expect after the test? You may return to your normal, everyday life, including diet, activities, and medicines, unless your health care provider tells you not to do that. Follow these instructions at home: It is up to you to get the results of your test. Ask your  health care provider, or the department that is doing the test, when your results will be ready. Keep all follow-up visits. This is important. Summary An echocardiogram is a test that uses sound waves (ultrasound) to produce images of the heart. Images from an echocardiogram can provide important information about the size and shape of your heart, heart muscle function, heart valve function, and other possible heart  problems. You do not need to do anything to prepare before this test. You may eat and drink normally. After the echocardiogram is completed, you may return to your normal, everyday life, unless your health care provider tells you not to do that. This information is not intended to replace advice given to you by your health care provider. Make sure you discuss any questions you have with your health care provider. Document Revised: 09/24/2020 Document Reviewed: 09/04/2019 Elsevier Patient Education  2023 [employer]       Follow-Up: At [facility], you and your health needs are our priority.  As part of our continuing mission to provide you with exceptional heart care, we have created designated Provider Care Teams.  These Care Teams include your primary Cardiologist (physician) and Advanced Practice Providers (APPs -  Physician Assistants and Nurse Practitioners) who all work together to provide you with the care you need, when you need it.  We recommend signing up for the patient portal called "MyChart".  Sign up information is provided on this After Visit Summary.  MyChart is used to connect with patients for Virtual Visits (Telemedicine).  Patients are able to view lab/test results, encounter notes, upcoming appointments, etc.  Non-urgent messages can be sent to your provider as well.   To learn more about what you can do with MyChart, go to [email or web address].    Your next appointment:   1 month follow after echo

## 2023-01-24 ENCOUNTER — Encounter: Payer: Self-pay | Admitting: Student

## 2023-01-24 ENCOUNTER — Ambulatory Visit: Payer: Medicare HMO | Attending: Student | Admitting: Student

## 2023-01-24 VITALS — BP 100/64 | HR 80 | Ht 72.0 in | Wt 192.4 lb

## 2023-01-24 DIAGNOSIS — I2510 Atherosclerotic heart disease of native coronary artery without angina pectoris: Secondary | ICD-10-CM | POA: Diagnosis not present

## 2023-01-24 DIAGNOSIS — I5042 Chronic combined systolic (congestive) and diastolic (congestive) heart failure: Secondary | ICD-10-CM | POA: Diagnosis not present

## 2023-01-24 DIAGNOSIS — I10 Essential (primary) hypertension: Secondary | ICD-10-CM | POA: Diagnosis not present

## 2023-01-24 DIAGNOSIS — I4821 Permanent atrial fibrillation: Secondary | ICD-10-CM

## 2023-01-24 LAB — CUP PACEART INCLINIC DEVICE CHECK
Date Time Interrogation Session: 20241230123950
Implantable Lead Connection Status: 753985
Implantable Lead Connection Status: 753985
Implantable Lead Implant Date: 20241202
Implantable Lead Implant Date: 20241202
Implantable Lead Location: 753858
Implantable Lead Location: 753860
Implantable Pulse Generator Implant Date: 20241202
Pulse Gen Model: 3562
Pulse Gen Serial Number: 8218905

## 2023-01-24 NOTE — Progress Notes (Signed)
  Electrophysiology Office Note:   ID:  Steve Anthony, Steve Anthony 11/05/1950, MRN [id number]  Primary Cardiologist: None Electrophysiologist: Will Jorja Loa, MD      History of Present Illness:   Steve Anthony is a 72 y.o. male with h/o CAD, COPD, HLD, SVT, DM2, OSA, and AF/AFL seen today for routine electrophysiology followup.   S/p PPM and AV nodal ablation 12/27/2022  At wound check 12/19 was hypotensive and symptomatic. Found to have lactic acidosis likely 2/2 dehydration and mild acute pancreatitis. He was observed overnight and then refused further inpatient care, requesting to go home.   Since last being seen in our clinic the patient reports doing OK. Still not eating very much per caregiver but no further profound weakness. Not very active. He denies chest pain, palpitations, dyspnea, PND, orthopnea, nausea, vomiting, syncope, edema, or weight gain.   Review of systems complete and found to be negative unless listed in HPI.   EP Information / Studies Reviewed:    EKG is not ordered today. EKG from 01/14/2023 reviewed which showed V paced rhythm at 82 bpm       PPM Interrogation-  reviewed in detail today,  See PACEART report.  Device History: Abbott BiV PPM implanted 12/27/2022 for Tachy-Brady syndrome  Echo 10/2022 LVEF 35-40%  Physical Exam:   VS:  BP 100/64   Pulse 80   Ht 6' (1.829 m)   Wt 192 lb 6.4 oz (87.3 kg)   SpO2 94%   BMI 26.09 kg/m    Wt Readings from Last 3 Encounters:  01/24/23 192 lb 6.4 oz (87.3 kg)  01/21/23 190 lb 12.8 oz (86.5 kg)  01/13/23 195 lb (88.5 kg)     GEN: Well nourished, well developed in no acute distress NECK: No JVD; No carotid bruits CARDIAC: Regular ( V paced) rate and rhythm, no murmurs, rubs, gallops RESPIRATORY:  Clear to auscultation without rales, wheezing or rhonchi  ABDOMEN: Soft, non-tender, non-distended EXTREMITIES:  No edema; No deformity   ASSESSMENT AND PLAN:    Uncontrolled atrial arrhyhtmia s/p AV node  ablation s/p Abbott PPM  Normal PPM function See Pace Art report LRL changed to 70 with rate response on.   HTN Stable on current regimen   CAD  Denies s/s ischemia  Secondary hypercoagulable state Pt on Eliquis as above   Disposition:   Follow up with Dr. Elberta Fortis in 3 months  Signed, Graciella Freer, PA-C

## 2023-01-24 NOTE — Patient Instructions (Signed)
Medication Instructions:  Your physician recommends that you continue on your current medications as directed. Please refer to the Current Medication list given to you today.  *If you need a refill on your cardiac medications before your next appointment, please call your pharmacy*  Lab Work: If you have labs (blood work) drawn today and your tests are completely normal, you will receive your results only by: MyChart Message (if you have MyChart) OR A paper copy in the mail If you have any lab test that is abnormal or we need to change your treatment, we will call you to review the results.  Follow-Up: At [facility], you and your health needs are our priority.  As part of our continuing mission to provide you with exceptional heart care, we have created designated Provider Care Teams.  These Care Teams include your primary Cardiologist (physician) and Advanced Practice Providers (APPs -  Physician Assistants and Nurse Practitioners) who all work together to provide you with the care you need, when you need it.  We recommend signing up for the patient portal called "MyChart".  Sign up information is provided on this After Visit Summary.  MyChart is used to connect with patients for Virtual Visits (Telemedicine).  Patients are able to view lab/test results, encounter notes, upcoming appointments, etc.  Non-urgent messages can be sent to your provider as well.   To learn more about what you can do with MyChart, go to [email or web address].    Your next appointment:   Is in March  Provider:   You may see Will Jorja Loa, MD or one of the following Advanced Practice Providers on your designated Care Team:   Francis Dowse, [place] [address] [place], [place] Sherie Don, NP Canary Brim, NP

## 2023-01-28 ENCOUNTER — Other Ambulatory Visit: Payer: Self-pay

## 2023-01-28 ENCOUNTER — Emergency Department (HOSPITAL_COMMUNITY): Payer: Medicare PPO

## 2023-01-28 ENCOUNTER — Encounter (HOSPITAL_COMMUNITY): Payer: Self-pay

## 2023-01-28 ENCOUNTER — Inpatient Hospital Stay (HOSPITAL_COMMUNITY): Payer: Medicare PPO

## 2023-01-28 ENCOUNTER — Ambulatory Visit (HOSPITAL_BASED_OUTPATIENT_CLINIC_OR_DEPARTMENT_OTHER)
Admission: EM | Admit: 2023-01-28 | Discharge: 2023-01-28 | Disposition: A | Discharge status: Other Acute Inpatient Hospital | Payer: Medicare PPO | Attending: Internal Medicine | Admitting: Internal Medicine

## 2023-01-28 ENCOUNTER — Inpatient Hospital Stay (HOSPITAL_COMMUNITY)
Admission: EM | Admit: 2023-01-28 | Discharge: 2023-02-26 | DRG: 871 | Disposition: E | Payer: Medicare PPO | Source: Ambulatory Visit | Attending: Critical Care Medicine | Admitting: Critical Care Medicine

## 2023-01-28 ENCOUNTER — Encounter (HOSPITAL_BASED_OUTPATIENT_CLINIC_OR_DEPARTMENT_OTHER): Payer: Self-pay

## 2023-01-28 DIAGNOSIS — J690 Pneumonitis due to inhalation of food and vomit: Secondary | ICD-10-CM | POA: Diagnosis present

## 2023-01-28 DIAGNOSIS — K7469 Other cirrhosis of liver: Secondary | ICD-10-CM | POA: Diagnosis present

## 2023-01-28 DIAGNOSIS — Z96641 Presence of right artificial hip joint: Secondary | ICD-10-CM | POA: Diagnosis present

## 2023-01-28 DIAGNOSIS — Z881 Allergy status to other antibiotic agents status: Secondary | ICD-10-CM

## 2023-01-28 DIAGNOSIS — Z7984 Long term (current) use of oral hypoglycemic drugs: Secondary | ICD-10-CM

## 2023-01-28 DIAGNOSIS — Z9081 Acquired absence of spleen: Secondary | ICD-10-CM

## 2023-01-28 DIAGNOSIS — Z87891 Personal history of nicotine dependence: Secondary | ICD-10-CM

## 2023-01-28 DIAGNOSIS — Z66 Do not resuscitate: Secondary | ICD-10-CM | POA: Diagnosis present

## 2023-01-28 DIAGNOSIS — E872 Acidosis, unspecified: Secondary | ICD-10-CM | POA: Diagnosis present

## 2023-01-28 DIAGNOSIS — R579 Shock, unspecified: Secondary | ICD-10-CM

## 2023-01-28 DIAGNOSIS — Z90411 Acquired partial absence of pancreas: Secondary | ICD-10-CM

## 2023-01-28 DIAGNOSIS — K559 Vascular disorder of intestine, unspecified: Secondary | ICD-10-CM | POA: Diagnosis present

## 2023-01-28 DIAGNOSIS — K219 Gastro-esophageal reflux disease without esophagitis: Secondary | ICD-10-CM | POA: Diagnosis present

## 2023-01-28 DIAGNOSIS — E1140 Type 2 diabetes mellitus with diabetic neuropathy, unspecified: Secondary | ICD-10-CM | POA: Diagnosis present

## 2023-01-28 DIAGNOSIS — J150 Pneumonia due to Klebsiella pneumoniae: Secondary | ICD-10-CM | POA: Diagnosis present

## 2023-01-28 DIAGNOSIS — I130 Hypertensive heart and chronic kidney disease with heart failure and stage 1 through stage 4 chronic kidney disease, or unspecified chronic kidney disease: Secondary | ICD-10-CM | POA: Diagnosis present

## 2023-01-28 DIAGNOSIS — Z950 Presence of cardiac pacemaker: Secondary | ICD-10-CM

## 2023-01-28 DIAGNOSIS — T380X5A Adverse effect of glucocorticoids and synthetic analogues, initial encounter: Secondary | ICD-10-CM | POA: Diagnosis not present

## 2023-01-28 DIAGNOSIS — R6521 Severe sepsis with septic shock: Secondary | ICD-10-CM | POA: Diagnosis present

## 2023-01-28 DIAGNOSIS — I959 Hypotension, unspecified: Secondary | ICD-10-CM | POA: Diagnosis not present

## 2023-01-28 DIAGNOSIS — J432 Centrilobular emphysema: Secondary | ICD-10-CM | POA: Diagnosis present

## 2023-01-28 DIAGNOSIS — Z860100 Personal history of colon polyps, unspecified: Secondary | ICD-10-CM

## 2023-01-28 DIAGNOSIS — Z8719 Personal history of other diseases of the digestive system: Secondary | ICD-10-CM

## 2023-01-28 DIAGNOSIS — B348 Other viral infections of unspecified site: Secondary | ICD-10-CM | POA: Diagnosis not present

## 2023-01-28 DIAGNOSIS — Z8249 Family history of ischemic heart disease and other diseases of the circulatory system: Secondary | ICD-10-CM

## 2023-01-28 DIAGNOSIS — H547 Unspecified visual loss: Secondary | ICD-10-CM | POA: Diagnosis present

## 2023-01-28 DIAGNOSIS — R627 Adult failure to thrive: Secondary | ICD-10-CM | POA: Diagnosis present

## 2023-01-28 DIAGNOSIS — I5021 Acute systolic (congestive) heart failure: Secondary | ICD-10-CM | POA: Diagnosis not present

## 2023-01-28 DIAGNOSIS — B9789 Other viral agents as the cause of diseases classified elsewhere: Secondary | ICD-10-CM | POA: Diagnosis present

## 2023-01-28 DIAGNOSIS — Z823 Family history of stroke: Secondary | ICD-10-CM

## 2023-01-28 DIAGNOSIS — Z794 Long term (current) use of insulin: Secondary | ICD-10-CM

## 2023-01-28 DIAGNOSIS — R42 Dizziness and giddiness: Secondary | ICD-10-CM

## 2023-01-28 DIAGNOSIS — D689 Coagulation defect, unspecified: Secondary | ICD-10-CM | POA: Diagnosis present

## 2023-01-28 DIAGNOSIS — I7121 Aneurysm of the ascending aorta, without rupture: Secondary | ICD-10-CM | POA: Diagnosis present

## 2023-01-28 DIAGNOSIS — D62 Acute posthemorrhagic anemia: Secondary | ICD-10-CM | POA: Diagnosis not present

## 2023-01-28 DIAGNOSIS — G8929 Other chronic pain: Secondary | ICD-10-CM | POA: Diagnosis present

## 2023-01-28 DIAGNOSIS — Z79899 Other long term (current) drug therapy: Secondary | ICD-10-CM

## 2023-01-28 DIAGNOSIS — I5042 Chronic combined systolic (congestive) and diastolic (congestive) heart failure: Secondary | ICD-10-CM | POA: Diagnosis present

## 2023-01-28 DIAGNOSIS — Z9981 Dependence on supplemental oxygen: Secondary | ICD-10-CM

## 2023-01-28 DIAGNOSIS — N179 Acute kidney failure, unspecified: Secondary | ICD-10-CM | POA: Diagnosis present

## 2023-01-28 DIAGNOSIS — J1289 Other viral pneumonia: Secondary | ICD-10-CM | POA: Diagnosis present

## 2023-01-28 DIAGNOSIS — A419 Sepsis, unspecified organism: Secondary | ICD-10-CM | POA: Diagnosis not present

## 2023-01-28 DIAGNOSIS — R570 Cardiogenic shock: Secondary | ICD-10-CM | POA: Diagnosis present

## 2023-01-28 DIAGNOSIS — S51811A Laceration without foreign body of right forearm, initial encounter: Secondary | ICD-10-CM | POA: Diagnosis present

## 2023-01-28 DIAGNOSIS — Z8616 Personal history of COVID-19: Secondary | ICD-10-CM | POA: Diagnosis not present

## 2023-01-28 DIAGNOSIS — Z87442 Personal history of urinary calculi: Secondary | ICD-10-CM

## 2023-01-28 DIAGNOSIS — J9601 Acute respiratory failure with hypoxia: Secondary | ICD-10-CM

## 2023-01-28 DIAGNOSIS — I50810 Right heart failure, unspecified: Secondary | ICD-10-CM | POA: Diagnosis not present

## 2023-01-28 DIAGNOSIS — R300 Dysuria: Secondary | ICD-10-CM | POA: Diagnosis present

## 2023-01-28 DIAGNOSIS — J9621 Acute and chronic respiratory failure with hypoxia: Secondary | ICD-10-CM | POA: Diagnosis not present

## 2023-01-28 DIAGNOSIS — E43 Unspecified severe protein-calorie malnutrition: Secondary | ICD-10-CM | POA: Diagnosis present

## 2023-01-28 DIAGNOSIS — N1831 Chronic kidney disease, stage 3a: Secondary | ICD-10-CM | POA: Diagnosis present

## 2023-01-28 DIAGNOSIS — A4159 Other Gram-negative sepsis: Principal | ICD-10-CM | POA: Diagnosis present

## 2023-01-28 DIAGNOSIS — Z955 Presence of coronary angioplasty implant and graft: Secondary | ICD-10-CM

## 2023-01-28 DIAGNOSIS — M545 Low back pain, unspecified: Secondary | ICD-10-CM | POA: Diagnosis present

## 2023-01-28 DIAGNOSIS — K861 Other chronic pancreatitis: Secondary | ICD-10-CM | POA: Diagnosis present

## 2023-01-28 DIAGNOSIS — Z9049 Acquired absence of other specified parts of digestive tract: Secondary | ICD-10-CM

## 2023-01-28 DIAGNOSIS — Z880 Allergy status to penicillin: Secondary | ICD-10-CM

## 2023-01-28 DIAGNOSIS — Z7901 Long term (current) use of anticoagulants: Secondary | ICD-10-CM

## 2023-01-28 DIAGNOSIS — E781 Pure hyperglyceridemia: Secondary | ICD-10-CM | POA: Diagnosis present

## 2023-01-28 DIAGNOSIS — G4733 Obstructive sleep apnea (adult) (pediatric): Secondary | ICD-10-CM | POA: Diagnosis present

## 2023-01-28 DIAGNOSIS — E1122 Type 2 diabetes mellitus with diabetic chronic kidney disease: Secondary | ICD-10-CM | POA: Diagnosis present

## 2023-01-28 DIAGNOSIS — I5082 Biventricular heart failure: Secondary | ICD-10-CM | POA: Diagnosis present

## 2023-01-28 DIAGNOSIS — I495 Sick sinus syndrome: Secondary | ICD-10-CM | POA: Diagnosis present

## 2023-01-28 DIAGNOSIS — Z515 Encounter for palliative care: Secondary | ICD-10-CM

## 2023-01-28 DIAGNOSIS — Z1612 Extended spectrum beta lactamase (ESBL) resistance: Secondary | ICD-10-CM | POA: Diagnosis present

## 2023-01-28 DIAGNOSIS — Z833 Family history of diabetes mellitus: Secondary | ICD-10-CM

## 2023-01-28 DIAGNOSIS — J189 Pneumonia, unspecified organism: Secondary | ICD-10-CM | POA: Diagnosis not present

## 2023-01-28 DIAGNOSIS — Z85820 Personal history of malignant melanoma of skin: Secondary | ICD-10-CM

## 2023-01-28 DIAGNOSIS — R296 Repeated falls: Secondary | ICD-10-CM | POA: Diagnosis present

## 2023-01-28 DIAGNOSIS — Z6825 Body mass index (BMI) 25.0-25.9, adult: Secondary | ICD-10-CM

## 2023-01-28 DIAGNOSIS — Z825 Family history of asthma and other chronic lower respiratory diseases: Secondary | ICD-10-CM

## 2023-01-28 DIAGNOSIS — D649 Anemia, unspecified: Secondary | ICD-10-CM | POA: Diagnosis present

## 2023-01-28 DIAGNOSIS — I2510 Atherosclerotic heart disease of native coronary artery without angina pectoris: Secondary | ICD-10-CM | POA: Diagnosis present

## 2023-01-28 DIAGNOSIS — Z981 Arthrodesis status: Secondary | ICD-10-CM

## 2023-01-28 DIAGNOSIS — R54 Age-related physical debility: Secondary | ICD-10-CM | POA: Diagnosis present

## 2023-01-28 DIAGNOSIS — E1165 Type 2 diabetes mellitus with hyperglycemia: Secondary | ICD-10-CM | POA: Diagnosis not present

## 2023-01-28 DIAGNOSIS — J440 Chronic obstructive pulmonary disease with acute lower respiratory infection: Secondary | ICD-10-CM | POA: Diagnosis present

## 2023-01-28 DIAGNOSIS — Z91199 Patient's noncompliance with other medical treatment and regimen due to unspecified reason: Secondary | ICD-10-CM

## 2023-01-28 DIAGNOSIS — I4821 Permanent atrial fibrillation: Secondary | ICD-10-CM | POA: Diagnosis present

## 2023-01-28 DIAGNOSIS — B961 Klebsiella pneumoniae [K. pneumoniae] as the cause of diseases classified elsewhere: Secondary | ICD-10-CM | POA: Diagnosis not present

## 2023-01-28 DIAGNOSIS — M4854XA Collapsed vertebra, not elsewhere classified, thoracic region, initial encounter for fracture: Secondary | ICD-10-CM | POA: Diagnosis present

## 2023-01-28 DIAGNOSIS — Z87440 Personal history of urinary (tract) infections: Secondary | ICD-10-CM

## 2023-01-28 DIAGNOSIS — Z7189 Other specified counseling: Secondary | ICD-10-CM | POA: Diagnosis not present

## 2023-01-28 DIAGNOSIS — R7881 Bacteremia: Secondary | ICD-10-CM

## 2023-01-28 DIAGNOSIS — S22020A Wedge compression fracture of second thoracic vertebra, initial encounter for closed fracture: Secondary | ICD-10-CM | POA: Diagnosis not present

## 2023-01-28 DIAGNOSIS — E7800 Pure hypercholesterolemia, unspecified: Secondary | ICD-10-CM | POA: Diagnosis present

## 2023-01-28 LAB — COMPREHENSIVE METABOLIC PANEL
ALT: 15 U/L (ref 0–44)
AST: 33 U/L (ref 15–41)
Albumin: 1.7 g/dL — ABNORMAL LOW (ref 3.5–5.0)
Alkaline Phosphatase: 74 U/L (ref 38–126)
Anion gap: 29 — ABNORMAL HIGH (ref 5–15)
BUN: 8 mg/dL (ref 8–23)
CO2: 12 mmol/L — ABNORMAL LOW (ref 22–32)
Calcium: 7.6 mg/dL — ABNORMAL LOW (ref 8.9–10.3)
Chloride: 96 mmol/L — ABNORMAL LOW (ref 98–111)
Creatinine, Ser: 1.84 mg/dL — ABNORMAL HIGH (ref 0.61–1.24)
GFR, Estimated: 39 mL/min — ABNORMAL LOW (ref >60)
Glucose, Bld: 96 mg/dL (ref 70–99)
Potassium: 4.2 mmol/L (ref 3.5–5.1)
Sodium: 137 mmol/L (ref 135–145)
Total Bilirubin: 1.7 mg/dL — ABNORMAL HIGH (ref 0.0–1.2)
Total Protein: 3.9 g/dL — ABNORMAL LOW (ref 6.5–8.1)

## 2023-01-28 LAB — MAGNESIUM: Magnesium: 1.1 mg/dL — ABNORMAL LOW (ref 1.7–2.4)

## 2023-01-28 LAB — APTT: aPTT: 41 s — ABNORMAL HIGH (ref 24–36)

## 2023-01-28 LAB — POCT I-STAT 7, (LYTES, BLD GAS, ICA,H+H)
Acid-base deficit: 13 mmol/L — ABNORMAL HIGH (ref 0.0–2.0)
Bicarbonate: 10.7 mmol/L — ABNORMAL LOW (ref 20.0–28.0)
Calcium, Ion: 1.09 mmol/L — ABNORMAL LOW (ref 1.15–1.40)
HCT: 26 % — ABNORMAL LOW (ref 39.0–52.0)
Hemoglobin: 8.8 g/dL — ABNORMAL LOW (ref 13.0–17.0)
O2 Saturation: 93 %
Patient temperature: 98.3
Potassium: 4.6 mmol/L (ref 3.5–5.1)
Sodium: 132 mmol/L — ABNORMAL LOW (ref 135–145)
TCO2: 11 mmol/L — ABNORMAL LOW (ref 22–32)
pCO2 arterial: 19.2 mm[Hg] — CL (ref 32–48)
pH, Arterial: 7.353 (ref 7.35–7.45)
pO2, Arterial: 68 mm[Hg] — ABNORMAL LOW (ref 83–108)

## 2023-01-28 LAB — CBC WITH DIFFERENTIAL/PLATELET
Abs Immature Granulocytes: 0.35 10*3/uL — ABNORMAL HIGH (ref 0.00–0.07)
Basophils Absolute: 0 10*3/uL (ref 0.0–0.1)
Basophils Relative: 0 %
Eosinophils Absolute: 0 10*3/uL (ref 0.0–0.5)
Eosinophils Relative: 0 %
HCT: 25.5 % — ABNORMAL LOW (ref 39.0–52.0)
Hemoglobin: 8.6 g/dL — ABNORMAL LOW (ref 13.0–17.0)
Immature Granulocytes: 2 %
Lymphocytes Relative: 1 %
Lymphs Abs: 0.2 10*3/uL — ABNORMAL LOW (ref 0.7–4.0)
MCH: 32.1 pg (ref 26.0–34.0)
MCHC: 33.7 g/dL (ref 30.0–36.0)
MCV: 95.1 fL (ref 80.0–100.0)
Monocytes Absolute: 1.1 10*3/uL — ABNORMAL HIGH (ref 0.1–1.0)
Monocytes Relative: 5 %
Neutro Abs: 21.5 10*3/uL — ABNORMAL HIGH (ref 1.7–7.7)
Neutrophils Relative %: 92 %
Platelets: 204 10*3/uL (ref 150–400)
RBC: 2.68 MIL/uL — ABNORMAL LOW (ref 4.22–5.81)
RDW: 23.4 % — ABNORMAL HIGH (ref 11.5–15.5)
Smear Review: NORMAL
WBC: 23.2 10*3/uL — ABNORMAL HIGH (ref 4.0–10.5)
nRBC: 0.2 % (ref 0.0–0.2)

## 2023-01-28 LAB — URINALYSIS, W/ REFLEX TO CULTURE (INFECTION SUSPECTED)
Bilirubin Urine: NEGATIVE
Glucose, UA: NEGATIVE mg/dL
Ketones, ur: 5 mg/dL — AB
Nitrite: NEGATIVE
Protein, ur: NEGATIVE mg/dL
Specific Gravity, Urine: 1.025 (ref 1.005–1.030)
pH: 5 (ref 5.0–8.0)

## 2023-01-28 LAB — HEMOGLOBIN AND HEMATOCRIT, BLOOD
HCT: 24.9 % — ABNORMAL LOW (ref 39.0–52.0)
HCT: 25 % — ABNORMAL LOW (ref 39.0–52.0)
Hemoglobin: 8.6 g/dL — ABNORMAL LOW (ref 13.0–17.0)
Hemoglobin: 8.6 g/dL — ABNORMAL LOW (ref 13.0–17.0)

## 2023-01-28 LAB — I-STAT CG4 LACTIC ACID, ED
Lactic Acid, Venous: 8.8 mmol/L (ref 0.5–1.9)
Lactic Acid, Venous: 9.5 mmol/L (ref 0.5–1.9)

## 2023-01-28 LAB — BRAIN NATRIURETIC PEPTIDE: B Natriuretic Peptide: 2380.8 pg/mL — ABNORMAL HIGH (ref 0.0–100.0)

## 2023-01-28 LAB — TYPE AND SCREEN
ABO/RH(D): AB POS
Antibody Screen: NEGATIVE

## 2023-01-28 LAB — MRSA NEXT GEN BY PCR, NASAL: MRSA by PCR Next Gen: NOT DETECTED

## 2023-01-28 LAB — ECHOCARDIOGRAM COMPLETE
Area-P 1/2: 3.65 cm2
S' Lateral: 3.1 cm

## 2023-01-28 LAB — COOXEMETRY PANEL
Carboxyhemoglobin: 1.7 % — ABNORMAL HIGH (ref 0.5–1.5)
Methemoglobin: 1.1 % (ref 0.0–1.5)
O2 Saturation: 63.4 %
Total hemoglobin: 8.9 g/dL — ABNORMAL LOW (ref 12.0–16.0)

## 2023-01-28 LAB — IRON AND TIBC: Iron: 29 ug/dL — ABNORMAL LOW (ref 45–182)

## 2023-01-28 LAB — PROCALCITONIN: Procalcitonin: 4.79 ng/mL

## 2023-01-28 LAB — RESP PANEL BY RT-PCR (RSV, FLU A&B, COVID)  RVPGX2
Influenza A by PCR: NEGATIVE
Influenza B by PCR: NEGATIVE
Resp Syncytial Virus by PCR: NEGATIVE
SARS Coronavirus 2 by RT PCR: NEGATIVE

## 2023-01-28 LAB — CORTISOL: Cortisol, Plasma: 27.2 ug/dL

## 2023-01-28 LAB — PROTIME-INR
INR: 3.5 — ABNORMAL HIGH (ref 0.8–1.2)
Prothrombin Time: 35.4 s — ABNORMAL HIGH (ref 11.4–15.2)

## 2023-01-28 LAB — FERRITIN: Ferritin: 518 ng/mL — ABNORMAL HIGH (ref 24–336)

## 2023-01-28 LAB — POCT FASTING CBG KUC MANUAL ENTRY: POCT Glucose (KUC): 61 mg/dL — AB (ref 70–99)

## 2023-01-28 LAB — LIPASE, BLOOD: Lipase: 17 U/L (ref 11–51)

## 2023-01-28 LAB — LACTIC ACID, PLASMA: Lactic Acid, Venous: 8.2 mmol/L (ref 0.5–1.9)

## 2023-01-28 LAB — GLUCOSE, CAPILLARY
Glucose-Capillary: 106 mg/dL — ABNORMAL HIGH (ref 70–99)
Glucose-Capillary: 121 mg/dL — ABNORMAL HIGH (ref 70–99)

## 2023-01-28 MED ORDER — IOHEXOL 350 MG/ML SOLN
60.0000 mL | Freq: Once | INTRAVENOUS | Status: AC | PRN
  Administered 2023-01-28: 60 mL via INTRAVENOUS

## 2023-01-28 MED ORDER — ATROPINE SULFATE 1 MG/10ML IJ SOSY
PREFILLED_SYRINGE | INTRAMUSCULAR | Status: AC
  Filled 2023-01-28: qty 10

## 2023-01-28 MED ORDER — NOREPINEPHRINE BITARTRATE 1 MG/ML IV SOLN
0.0000 ug/min | INTRAVENOUS | Status: DC
  Filled 2023-01-28: qty 16

## 2023-01-28 MED ORDER — SODIUM CHLORIDE 0.9 % IV SOLN: INTRAVENOUS | Status: AC | PRN

## 2023-01-28 MED ORDER — REVEFENACIN 175 MCG/3ML IN SOLN
175.0000 ug | Freq: Every day | RESPIRATORY_TRACT | Status: DC
Start: 2023-01-28 — End: 2023-02-01
  Administered 2023-01-29 – 2023-02-01 (×4): 175 ug via RESPIRATORY_TRACT
  Filled 2023-01-28 (×4): qty 3

## 2023-01-28 MED ORDER — LACTATED RINGERS IV BOLUS
1000.0000 mL | Freq: Once | INTRAVENOUS | Status: AC
  Administered 2023-01-28: 1000 mL via INTRAVENOUS

## 2023-01-28 MED ORDER — GUAIFENESIN 100 MG/5ML PO LIQD
5.0000 mL | ORAL | Status: DC | PRN
  Administered 2023-01-28 – 2023-01-29 (×5): 5 mL via ORAL
  Filled 2023-01-28 (×5): qty 5

## 2023-01-28 MED ORDER — PIPERACILLIN-TAZOBACTAM 3.375 G IVPB
3.3750 g | Freq: Three times a day (TID) | INTRAVENOUS | Status: DC
  Administered 2023-01-28 – 2023-01-29 (×2): 3.375 g via INTRAVENOUS
  Filled 2023-01-28 (×2): qty 50

## 2023-01-28 MED ORDER — PIPERACILLIN-TAZOBACTAM 3.375 G IVPB 30 MIN
3.3750 g | Freq: Once | INTRAVENOUS | Status: AC
  Administered 2023-01-28: 3.375 g via INTRAVENOUS
  Filled 2023-01-28: qty 50

## 2023-01-28 MED ORDER — MOMETASONE FURO-FORMOTEROL FUM 100-5 MCG/ACT IN AERO
2.0000 | INHALATION_SPRAY | Freq: Two times a day (BID) | RESPIRATORY_TRACT | Status: DC
  Filled 2023-01-28: qty 8.8

## 2023-01-28 MED ORDER — NOREPINEPHRINE-SODIUM CHLORIDE 16-0.9 MG/250ML-% IV SOLN
0.0000 ug/min | INTRAVENOUS | Status: DC
  Filled 2023-01-28 (×3): qty 250

## 2023-01-28 MED ORDER — CALCIUM GLUCONATE-NACL 2-0.675 GM/100ML-% IV SOLN
2.0000 g | Freq: Once | INTRAVENOUS | Status: AC
  Administered 2023-01-28: 2000 mg via INTRAVENOUS
  Filled 2023-01-28: qty 100

## 2023-01-28 MED ORDER — VANCOMYCIN VARIABLE DOSE PER UNSTABLE RENAL FUNCTION (PHARMACIST DOSING): Status: DC

## 2023-01-28 MED ORDER — PANTOPRAZOLE SODIUM 40 MG IV SOLR
40.0000 mg | INTRAVENOUS | Status: DC
  Administered 2023-01-29 – 2023-01-31 (×3): 40 mg via INTRAVENOUS
  Filled 2023-01-28 (×4): qty 10

## 2023-01-28 MED ORDER — MOMETASONE FURO-FORMOTEROL FUM 100-5 MCG/ACT IN AERO
2.0000 | INHALATION_SPRAY | Freq: Two times a day (BID) | RESPIRATORY_TRACT | Status: DC
  Administered 2023-01-29 – 2023-02-01 (×7): 2 via RESPIRATORY_TRACT
  Filled 2023-01-28: qty 8.8

## 2023-01-28 MED ORDER — HEPARIN (PORCINE) 25000 UT/250ML-% IV SOLN
1700.0000 [IU]/h | INTRAVENOUS | Status: DC
  Administered 2023-01-28: 1200 [IU]/h via INTRAVENOUS
  Administered 2023-01-29 – 2023-01-30 (×2): 1350 [IU]/h via INTRAVENOUS
  Administered 2023-01-31: 1500 [IU]/h via INTRAVENOUS
  Administered 2023-02-01: 1600 [IU]/h via INTRAVENOUS
  Filled 2023-01-28 (×5): qty 250

## 2023-01-28 MED ORDER — DOCUSATE SODIUM 100 MG PO CAPS: 100.0000 mg | ORAL_CAPSULE | Freq: Two times a day (BID) | ORAL | Status: DC | PRN

## 2023-01-28 MED ORDER — NOREPINEPHRINE 16 MG/250ML-% IV SOLN
0.0000 ug/min | INTRAVENOUS | Status: DC
  Administered 2023-01-28: 2 ug/min via INTRAVENOUS
  Administered 2023-01-29: 30 ug/min via INTRAVENOUS
  Administered 2023-01-31: 2 ug/min via INTRAVENOUS
  Filled 2023-01-28 (×3): qty 250

## 2023-01-28 MED ORDER — ACETAMINOPHEN 325 MG PO TABS: 650.0000 mg | ORAL_TABLET | Freq: Four times a day (QID) | ORAL | Status: DC | PRN

## 2023-01-28 MED ORDER — POLYETHYLENE GLYCOL 3350 17 G PO PACK: 17.0000 g | PACK | Freq: Every day | ORAL | Status: DC | PRN

## 2023-01-28 MED ORDER — ALBUTEROL SULFATE (2.5 MG/3ML) 0.083% IN NEBU: 2.5000 mg | INHALATION_SOLUTION | Freq: Four times a day (QID) | RESPIRATORY_TRACT | Status: DC | PRN

## 2023-01-28 MED ORDER — DOBUTAMINE-DEXTROSE 4-5 MG/ML-% IV SOLN
2.5000 ug/kg/min | INTRAVENOUS | Status: DC
  Administered 2023-01-28 – 2023-01-31 (×2): 2.5 ug/kg/min via INTRAVENOUS
  Filled 2023-01-28 (×3): qty 250

## 2023-01-28 MED ORDER — ONDANSETRON HCL 4 MG/2ML IJ SOLN
4.0000 mg | Freq: Once | INTRAMUSCULAR | Status: AC
  Administered 2023-01-28: 4 mg via INTRAVENOUS
  Filled 2023-01-28: qty 2

## 2023-01-28 MED ORDER — FENTANYL CITRATE PF 50 MCG/ML IJ SOSY
12.5000 ug | PREFILLED_SYRINGE | INTRAMUSCULAR | Status: DC | PRN
  Administered 2023-01-28 – 2023-01-29 (×5): 12.5 ug via INTRAVENOUS
  Filled 2023-01-28 (×5): qty 1

## 2023-01-28 MED ORDER — MAGNESIUM SULFATE 50 % IJ SOLN
6.0000 g | Freq: Once | INTRAVENOUS | Status: AC
  Administered 2023-01-28: 6 g via INTRAVENOUS
  Filled 2023-01-28: qty 12

## 2023-01-28 MED ORDER — VANCOMYCIN HCL 1750 MG/350ML IV SOLN
1750.0000 mg | INTRAVENOUS | Status: AC
  Administered 2023-01-28: 1750 mg via INTRAVENOUS
  Filled 2023-01-28: qty 350

## 2023-01-28 MED ORDER — NOREPINEPHRINE 4 MG/250ML-% IV SOLN
0.0000 ug/min | INTRAVENOUS | Status: DC
  Administered 2023-01-28: 10 ug/min via INTRAVENOUS
  Filled 2023-01-28 (×2): qty 250

## 2023-01-28 NOTE — ED Provider Notes (Addendum)
 [place] EMERGENCY DEPARTMENT AT [facility] Provider Note  CSN: [id number] Arrival date & time: 01/28/23 1256  Chief Complaint(s) Weakness and Hypotension  HPI Steve Anthony is a 73 y.o. male 73 year old male here today for weakness.  He has a past medical history significant for heart failure, paroxysmal A-fib on Eliquis , sick sinus syndrome status post pacemaker in December of this past year, COPD, chronic kidney disease with a baseline creatinine of 1.2-1.5 who presents emergency department today due to weakness and fatigue of 1 week duration.  Patient also reports that he is previously had a staph infection in his right arm after having a cancer biopsy.  Patient was in urgent care today due to cough, congestion, was noted to be hypotensive there.   Past Medical History Past Medical History:  Diagnosis Date   Abnormal CT of the abdomen 05/27/2018   Abnormal MRI of abdomen 04/14/2018   Acute pancreatitis 02/20/2019   Anemia    Anxiety disorder 04/09/2015   Arthritis    Asthma    Atrial fibrillation (HCC) 11/21/2008   Qualifier: Diagnosis of   By: Waddell, MD, CODY Danelle Fallow        CAD (coronary artery disease)    a. S/P PCI mid LAD (vision stent) 08/31/2004;  b. repeat cath 6/11 - no progression of CAD. c. Cath 10/2012 given moderate CAD on cardiac CT - no change from prior cath.   Centrilobular emphysema (HCC) 10/08/2022   Chest CT 08/04/2022     Chest pain 12/16/2016   CHEST PAIN UNSPECIFIED 11/21/2008   Qualifier: Diagnosis of   By: Ardelia, EMT-P, Tara         CHF (congestive heart failure) (HCC), reduced LVEF 35 to 40% by TTE 11/19/2022, new decrease compared to prior echocardiogram 2022.  Likely tachycardia mediated in the setting of A-fib RVR 12/17/2022   Chronic respiratory failure with hypoxia (HCC) 02/24/2017   Chronic systolic CHF (congestive heart failure) (HCC) 01/14/2023   Cirrhosis of liver without ascites (HCC) 04/14/2018   CKD (chronic kidney  disease), stage III (HCC) 02/20/2019   COPD (chronic obstructive pulmonary disease) (HCC)    COPD without exacerbation (HCC) 05/15/2009   Qualifier: Diagnosis of   By: Jude MD, Harden GAILS.         COVID-19 virus infection    Cyst of nipple, right 11/03/2020   Degeneration of lumbar intervertebral disc 04/09/2015   Diverticulitis    Dyslipidemia    Essential hypertension 10/15/2008   Qualifier: Diagnosis of   By: Rolan Hough         Gallstones    GERD 10/15/2008   Qualifier: Diagnosis of   By: Rolan Hough         High risk medication use 04/09/2015   History of COVID-19 01/24/19 02/20/2019   History of kidney stones    History of pelvic fracture 01/01/2020   History of tattoo 08/23/2016   History of UTI 11/28/2019   Hypercoagulable state due to persistent atrial fibrillation (HCC) 03/26/2022   Hyperlipidemia 10/15/2008   Qualifier: Diagnosis of   By: Rolan Hough         Hypertension    Hyperthyroidism    HYPERTRIGLYCERIDEMIA 10/15/2008   Qualifier: Diagnosis of   By: Rolan Hough         Hypotension 06/13/2019   Idiopathic acute pancreatitis 04/14/2018   Incisional hernia, without obstruction or gangrene 02/05/2016   Infected pancreatic pseudocyst 06/07/2019   Insomnia 04/09/2015   Iron deficiency anemia 05/03/2022  Kidney mass 07/06/2017   Following with urology. Chao.  Recommend follow up. Last seen 01/2018  Refuses to see again.   CT okay 04/2021     Long term (current) use of anticoagulants [Z79.01] 06/10/2015   Melanoma (HCC)    melanoma removed from neck    Mild episode of recurrent major depressive disorder (HCC) 08/27/2015   NEPHROLITHIASIS 10/15/2008   Qualifier: Diagnosis of   By: Rolan Hough         OA (osteoarthritis) of hip 12/18/2014   On home oxygen therapy    2L q hs (11/08/2012),  06/04/19 pt. not on oxygen   OSA (obstructive sleep apnea)    a. cpap noncompliance- patient reports on 12/10/14- does not use CPAP   Palpitations    a. 10/2012 -  s/p LINQ loop recorder to assess for arrhythmia.    Pancreatic cyst 04/14/2018   Pancreatic pseudocyst 06/07/2019   Pancreatitis    Paroxysmal A-fib (HCC) 11/21/2008   a. S/P PVI 2/11 and 9/11   Paroxysmal atrial fibrillation (HCC) 01/14/2023   Pelvic fracture (HCC) 12/31/2019   Persistent atrial fibrillation (HCC) 06/08/2022   PICC (peripherally inserted central catheter) in place    Pneumonia    Protein-calorie malnutrition, severe 06/12/2019   Pseudocyst of pancreas 03/06/2019   Pure hypercholesterolemia    S/P coronary artery stent placement    SHORTNESS OF BREATH 03/19/2009   Qualifier: Diagnosis of   By: Kelsie MD, James         Sleep apnea 05/17/2009   Qualifier: Diagnosis of   By: Jude MD, Harden GAILS.         SVT (supraventricular tachycardia) (HCC)    a. Atypical AVNRT of the slow pathway   Thoracic aortic aneurysm (HCC) 11/13/2020   Type II diabetes mellitus (HCC)    Unstable angina (HCC) 10/15/2008   Qualifier: Diagnosis of   By: Rolan Hough         Patient Active Problem List   Diagnosis Date Noted   Dehydration 01/14/2023   Chronic systolic CHF (congestive heart failure) (HCC) 01/14/2023   Paroxysmal atrial fibrillation (HCC) 01/14/2023   History of COPD 01/14/2023   Lactic acidosis 01/13/2023   CHF (congestive heart failure) (HCC), reduced LVEF 35 to 40% by TTE 11/19/2022, new decrease compared to prior echocardiogram 2022.  Likely tachycardia mediated in the setting of A-fib RVR 12/17/2022   Anemia    Arthritis    Asthma    COPD (chronic obstructive pulmonary disease) (HCC)    COVID-19 virus infection    Diverticulitis    Gallstones    History of kidney stones    Hyperthyroidism    Melanoma (HCC)    On home oxygen therapy    OSA (obstructive sleep apnea)    Pancreatitis    PICC (peripherally inserted central catheter) in place    Pneumonia    SVT (supraventricular tachycardia) (HCC)    DM2 (diabetes mellitus, type 2) (HCC)    Centrilobular emphysema  (HCC) 10/08/2022   Iron deficiency anemia 05/03/2022   Thoracic aortic aneurysm (HCC) 11/13/2020   Cyst of nipple, right 11/03/2020   History of pelvic fracture 01/01/2020   Pelvic fracture (HCC) 12/31/2019   History of UTI 11/28/2019   Protein-calorie malnutrition, severe 06/12/2019   Pancreatic pseudocyst 06/07/2019   Infected pancreatic pseudocyst 06/07/2019   Pseudocyst of pancreas 03/06/2019   Acute pancreatitis 02/20/2019   CKD stage 3a, GFR 45-59 ml/min (HCC) 02/20/2019   History of COVID-19 01/24/19 02/20/2019  Abnormal CT of the abdomen 05/27/2018   Recurrent pancreatitis 05/27/2018   Idiopathic acute pancreatitis 04/14/2018   Abnormal MRI of abdomen 04/14/2018   Pancreatic cyst 04/14/2018   Cirrhosis of liver without ascites (HCC) 04/14/2018   Kidney mass 07/06/2017   Chronic hypoxic respiratory failure (HCC) 02/24/2017   Chest pain 12/16/2016   S/P coronary artery stent placement    History of tattoo 08/23/2016   Incisional hernia, without obstruction or gangrene 02/05/2016   Mild episode of recurrent major depressive disorder (HCC) 08/27/2015   Long term (current) use of anticoagulants [Z79.01] 06/10/2015   High risk medication use 04/09/2015   Degeneration of lumbar intervertebral disc 04/09/2015   Anxiety disorder 04/09/2015   Insomnia 04/09/2015   OA (osteoarthritis) of hip 12/18/2014   CAD  s/p PCI of Mid LAD 08/2004, Cath 06/2009 unchagned, recent CTA 2022 with severe LCX disease vs artifact; followed by Stress nuclear test, no ischemia 10/2020 12/31/2009   Sleep apnea 05/17/2009   COPD without exacerbation (HCC) 05/15/2009   SHORTNESS OF BREATH 03/19/2009   CHEST PAIN UNSPECIFIED 11/21/2008   Permanent atrial fibrillation (HCC), s/p ablation 2011; failed rhythm control; persistent rapid rates, s/p AV nodal ablation and BiV permanent pacemaker implant 12/27/2022 11/21/2008   Hyperlipidemia 10/15/2008   Essential hypertension 10/15/2008   GERD 10/15/2008    NEPHROLITHIASIS 10/15/2008   PALPITATIONS 10/15/2008   Dyslipidemia 10/15/2008   Home Medication(s) Prior to Admission medications   Medication Sig Start Date End Date Taking? Authorizing Provider  metFORMIN  (GLUCOPHAGE ) 1000 MG tablet Take 1,000 mg by mouth 2 (two) times daily. 01/24/23  Yes [provider]  apixaban  (ELIQUIS ) 5 MG TABS tablet Take 1 tablet (5 mg total) by mouth 2 (two) times daily. 12/30/22   Riddle, Suzann, NP  baclofen  (LIORESAL ) 10 MG tablet Take 10 mg by mouth every 8 (eight) hours as needed for muscle spasms.    [provider]  escitalopram  (LEXAPRO ) 10 MG tablet Take 10 mg by mouth daily. 08/06/22 08/06/23  [provider]  ferrous sulfate  325 (65 FE) MG tablet Take 325 mg by mouth daily with breakfast.    [provider]  furosemide  (LASIX ) 20 MG tablet Take 40 mg by mouth daily as needed for fluid or edema (as needed for 2 pound weight gain in 1 day or 4 pound weight gain in one week.). 09/09/21   [provider]  gabapentin  (NEURONTIN ) 300 MG capsule Take 1 capsule (300 mg total) by mouth 3 (three) times daily. 01/07/20   Pahwani, Rinka R, MD  Insulin  Pen Needle 29G X MISC 1 Device by Does not apply route 3 (three) times daily as needed (for use with insulin  pen). 06/18/19   Cindy Garnette POUR, MD  magnesium  oxide (MAG-OX) 400 MG tablet Take 1 tablet (400 mg total) by mouth daily. 06/12/22   Barrett, Shona MATSU, PA-C  Melatonin 10 MG CAPS Take 20 mg by mouth at bedtime.    [provider]  Multiple Vitamins-Minerals (PRESERVISION AREDS) TABS Take 1 tablet by mouth 2 (two) times daily.    [provider]  nitroGLYCERIN  (NITROSTAT ) 0.4 MG SL tablet Place 0.4 mg under the tongue every 5 (five) minutes as needed for chest pain.    [provider]  Oxycodone  HCl 10 MG TABS Take 1 tablet by mouth daily as needed (For pain). Take 1 tablet every 4-6 hours as needed 04/09/22   [provider]   pantoprazole  (PROTONIX ) 40 MG tablet Take 1 tablet (40  mg total) by mouth 2 (two) times daily. 11/29/22   Charlanne Groom, MD  Tamsulosin  HCl (FLOMAX ) 0.4 MG CAPS Take 0.4 mg by mouth daily.    [provider]  zolpidem  (AMBIEN ) 5 MG tablet Take 0.5 tablets (2.5 mg total) by mouth at bedtime as needed for sleep. 05/06/22   Thurmond Cathlyn LABOR., MD                                                                                                                                    Past Surgical History Past Surgical History:  Procedure Laterality Date   ATRIAL ABLATION SURGERY  2007   S/P slow pathway ablation for atypical AVNRT    AV NODE ABLATION N/A 12/27/2022   Procedure: AV NODE ABLATION;  Surgeon: Inocencio Soyla Lunger, MD;  Location: [facility] INVASIVE CV LAB;  Service: Cardiovascular;  Laterality: N/A;   BIOPSY  07/17/2018   Procedure: BIOPSY;  Surgeon: Wilhelmenia Aloha Raddle., MD;  Location: [facility] ENDOSCOPY;  Service: Gastroenterology;;   BIV PACEMAKER INSERTION CRT-P N/A 12/27/2022   Procedure: BIV PACEMAKER INSERTION CRT-P;  Surgeon: Inocencio Soyla Lunger, MD;  Location: [facility] INVASIVE CV LAB;  Service: Cardiovascular;  Laterality: N/A;   CARDIOVERSION N/A 04/13/2022   Procedure: CARDIOVERSION;  Surgeon: Mona Vinie BROCKS, MD;  Location: [facility] ENDOSCOPY;  Service: Cardiovascular;  Laterality: N/A;   CARDIOVERSION N/A 06/10/2022   Procedure: CARDIOVERSION;  Surgeon: Delford Maude BROCKS, MD;  Location: [facility] INVASIVE CV LAB;  Service: Cardiovascular;  Laterality: N/A;   CHOLECYSTECTOMY  ~ 2008   COLON RESECTION  02/2013    COLONOSCOPY  12/25/2007   Colonic polyp status post polypectomy. Internal hemorrhoids.    CORONARY ANGIOPLASTY WITH STENT PLACEMENT  08/30/2004   Vision; to LAD    CYSTOSCOPY W/ STONE MANIPULATION  ~ 1998   once (11/08/2012)   EP Study  2008   Negative EP study in [place]   ESOPHAGOGASTRODUODENOSCOPY (EGD) WITH PROPOFOL  N/A 07/17/2018   Procedure: ESOPHAGOGASTRODUODENOSCOPY (EGD) WITH  PROPOFOL ;  Surgeon: Wilhelmenia Aloha Raddle., MD;  Location: [facility] ENDOSCOPY;  Service: Gastroenterology;  Laterality: N/A;   FRACTURE SURGERY     LEFT HEART CATHETERIZATION WITH CORONARY ANGIOGRAM N/A 11/10/2012   Procedure: LEFT HEART CATHETERIZATION WITH CORONARY ANGIOGRAM;  Surgeon: Lonni JONETTA Cash, MD;  Location: [facility] CATH LAB;  Service: Cardiovascular;  Laterality: N/A;   LOOP RECORDER IMPLANT N/A 11/10/2012   Medtronic LinQ implanted by Dr Kelsie for afib management   LUMBAR DISC SURGERY  2000; 2002   POSTERIOR LUMBAR FUSION  2004   PROSTATE SURGERY  ~ 2009   PVI/ CTI ablation  02/25/2009 and 09/2009   S/P afib/ CTI ablation   SPLENECTOMY, TOTAL N/A 06/07/2019   Procedure: SPLENECTOMY;  Surgeon: Aron Shoulders, MD;  Location: [facility] OR;  Service: General;  Laterality: N/A;   TIBIA FRACTURE SURGERY Right 1990   broke in 3 places; had rod put in (11/08/2012)   TIBIA  HARDWARE REMOVAL Right ~ 1991   TONSILLECTOMY  1965   TOTAL HIP ARTHROPLASTY Right 12/18/2014   Procedure: RIGHT TOTAL HIP ARTHROPLASTY ANTERIOR APPROACH;  Surgeon: Dempsey Moan, MD;  Location: [facility] ORS;  Service: Orthopedics;  Laterality: Right;   UPPER ESOPHAGEAL ENDOSCOPIC ULTRASOUND (EUS) N/A 07/17/2018   Procedure: UPPER ESOPHAGEAL ENDOSCOPIC ULTRASOUND (EUS);  Surgeon: Wilhelmenia Aloha Raddle., MD;  Location: [facility] ENDOSCOPY;  Service: Gastroenterology;  Laterality: N/A;   Family History Family History  Problem Relation Age of Onset   Emphysema Mother    Heart attack Father        CVA   Hypertension Father    Diabetes Father    Heart disease Father        MI   Emphysema Maternal Grandfather        Smoker   Heart disease Paternal Grandfather    Colon cancer Neg Hx    Esophageal cancer Neg Hx    Inflammatory bowel disease Neg Hx    Liver disease Neg Hx    Pancreatic cancer Neg Hx    Rectal cancer Neg Hx    Stomach cancer Neg Hx     Social History Social History   Tobacco Use   Smoking status: Former     Current packs/day: 0.00    Average packs/day: 3.0 packs/day for 38.0 years (114.0 ttl pk-yrs)    Types: Cigarettes    Start date: 03/26/1966    Quit date: 03/25/2004    Years since quitting: 18.8   Smokeless tobacco: Former    Types: Chew    Quit date: 04/25/2004   Tobacco comments:    Former smoker 03/26/22  Vaping Use   Vaping status: Never Used  Substance Use Topics   Alcohol  use: Not Currently    Comment:  quit drinking in 1989   Drug use: No   Allergies Penicillins and Levofloxacin   Review of Systems Review of Systems  Physical Exam Vital Signs  I have reviewed the triage vital signs BP (!) 80/56   Pulse 73   Temp 98 F (36.7 C) (Oral)   Resp 15   SpO2 100%   Physical Exam Vitals reviewed.  Constitutional:      Appearance: He is ill-appearing and toxic-appearing.  HENT:     Head: Normocephalic.     Nose: Nose normal.  Eyes:     Pupils: Pupils are equal, round, and reactive to light.  Cardiovascular:     Pulses: Normal pulses.  Abdominal:     Palpations: Abdomen is soft.  Musculoskeletal:     Cervical back: Normal range of motion.  Skin:    General: Skin is warm and dry.     Comments: No overt cellulitis.  Bruising over the left side of the chest wall  Neurological:     General: No focal deficit present.     ED Results and Treatments Labs (all labs ordered are listed, but only abnormal results are displayed) Labs Reviewed  COMPREHENSIVE METABOLIC PANEL - Abnormal; Notable for the following components:      Result Value   Chloride 96 (*)    CO2 12 (*)    Creatinine, Ser 1.84 (*)    Calcium  7.6 (*)    Total Protein 3.9 (*)    Albumin  1.7 (*)    Total Bilirubin 1.7 (*)    GFR, Estimated 39 (*)    Anion gap 29 (*)    All other components within normal limits  CBC WITH DIFFERENTIAL/PLATELET - Abnormal; Notable for  the following components:   WBC 23.2 (*)    RBC 2.68 (*)    Hemoglobin 8.6 (*)    HCT 25.5 (*)    RDW 23.4 (*)    Neutro Abs 21.5  (*)    Lymphs Abs 0.2 (*)    Monocytes Absolute 1.1 (*)    Abs Immature Granulocytes 0.35 (*)    All other components within normal limits  PROTIME-INR - Abnormal; Notable for the following components:   Prothrombin Time 35.4 (*)    INR 3.5 (*)    All other components within normal limits  APTT - Abnormal; Notable for the following components:   aPTT 41 (*)    All other components within normal limits  I-STAT CG4 LACTIC ACID, ED - Abnormal; Notable for the following components:   Lactic Acid, Venous 8.8 (*)    All other components within normal limits  RESP PANEL BY RT-PCR (RSV, FLU A&B, COVID)  RVPGX2  CULTURE, BLOOD (ROUTINE X 2)  CULTURE, BLOOD (ROUTINE X 2)  URINALYSIS, W/ REFLEX TO CULTURE (INFECTION SUSPECTED)  I-STAT CG4 LACTIC ACID, ED                                                                                                                          Radiology No results found.  Pertinent labs & imaging results that were available during my care of the patient were reviewed by me and considered in my medical decision making (see MDM for details).  Medications Ordered in ED Medications  norepinephrine  (LEVOPHED ) 4mg  in (0.016 mg/mL) premix infusion (10 mcg/min Intravenous New Bag/Given 01/28/23 1351)  vancomycin  (VANCOREADY) IVPB 1750 mg/350 mL (has no administration in time range)  piperacillin -tazobactam (ZOSYN ) IVPB 3.375 g (3.375 g Intravenous New Bag/Given 01/28/23 1352)  lactated ringers  bolus 1,000 mL (1,000 mLs Intravenous New Bag/Given 01/28/23 1352)                                                                                                                                     Procedures .Critical Care  Performed by: Mannie Fairy DASEN, DO Authorized by: Mannie Fairy DASEN, DO   Critical care provider statement:    Critical care time (minutes):  30   Critical care was necessary to treat or prevent imminent or life-threatening deterioration of the  following conditions:  Sepsis   Critical care was time spent personally by  me on the following activities:  Development of treatment plan with patient or surrogate, discussions with consultants, evaluation of patient's response to treatment, examination of patient, ordering and review of laboratory studies, ordering and review of radiographic studies, ordering and performing treatments and interventions, pulse oximetry, re-evaluation of patient's condition and review of old charts   (including critical care time)  Medical Decision Making / ED Course   This patient presents to the ED for concern of sepsis, this involves an extensive number of treatment options, and is a complaint that carries with it a high risk of complications and morbidity.  The differential diagnosis includes sepsis, septic shock, intra-abdominal infection, cystitis, pneumonia, pancreatitis, traumatic injury.  MDM: 73 year old male here today presenting with septic shock.  Patient with a history of heart failure, did perform bedside ultrasound which did show modest EF 25 to 30%.  Out of concern for fluid overload, will give the patient 2 L of IV fluids and reassess.  Concern would be for respiratory distress if patient received 30 cc/kg of IV fluid.  Patient was started on norepinephrine .  Looking at the patient's labs, white count of 23, lactic acid of 8.8.  He has received vancomycin  and Zosyn .  Possible traumatic injury as well for this patient.  He did have a fall 1 week ago and did not seek care.  Does have bruising on left side of the chest. Hgb 8.6. Will obtain imaging of the patient's chest abdomen pelvis.  Head CT imaging ordered.  Patient's daughter at bedside.  Patient will be signed out to Dr. Bari pending imaging, labs and ICU admission.  Reassessment 3 PM-I reevaluated the patient following 2 L of IV fluid.  Blood pressure is improved.  Lactic acid continues to increase, have added on a 3rd L. This patient  has septic shock.   Additional history obtained: -Additional history obtained from daughter at bedside -External records from outside source obtained and reviewed including: Chart review including previous notes, labs, imaging, consultation notes   Lab Tests: -I ordered, reviewed, and interpreted labs.   The pertinent results include:   Labs Reviewed  COMPREHENSIVE METABOLIC PANEL - Abnormal; Notable for the following components:      Result Value   Chloride 96 (*)    CO2 12 (*)    Creatinine, Ser 1.84 (*)    Calcium  7.6 (*)    Total Protein 3.9 (*)    Albumin  1.7 (*)    Total Bilirubin 1.7 (*)    GFR, Estimated 39 (*)    Anion gap 29 (*)    All other components within normal limits  CBC WITH DIFFERENTIAL/PLATELET - Abnormal; Notable for the following components:   WBC 23.2 (*)    RBC 2.68 (*)    Hemoglobin 8.6 (*)    HCT 25.5 (*)    RDW 23.4 (*)    Neutro Abs 21.5 (*)    Lymphs Abs 0.2 (*)    Monocytes Absolute 1.1 (*)    Abs Immature Granulocytes 0.35 (*)    All other components within normal limits  PROTIME-INR - Abnormal; Notable for the following components:   Prothrombin Time 35.4 (*)    INR 3.5 (*)    All other components within normal limits  APTT - Abnormal; Notable for the following components:   aPTT 41 (*)    All other components within normal limits  I-STAT CG4 LACTIC ACID, ED - Abnormal; Notable for the following components:   Lactic Acid, Venous 8.8 (*)  All other components within normal limits  RESP PANEL BY RT-PCR (RSV, FLU A&B, COVID)  RVPGX2  CULTURE, BLOOD (ROUTINE X 2)  CULTURE, BLOOD (ROUTINE X 2)  URINALYSIS, W/ REFLEX TO CULTURE (INFECTION SUSPECTED)  I-STAT CG4 LACTIC ACID, ED      EKG junctional rhythm, no ST segment elevation.  No acute ischemia.  EKG Interpretation Date/Time:    Ventricular Rate:    PR Interval:    QRS Duration:    QT Interval:    QTC Calculation:   R Axis:      Text Interpretation:           Imaging  Studies ordered: I ordered imaging studies including CT imaging of the head, CT imaging of the chest abdomen pelvis I independently visualized and interpreted imaging. I agree with the radiologist interpretation   Medicines ordered and prescription drug management: Meds ordered this encounter  Medications   norepinephrine  (LEVOPHED ) 4mg  in (0.016 mg/mL) premix infusion   piperacillin -tazobactam (ZOSYN ) IVPB 3.375 g    Antibiotic Indication::   Sepsis   lactated ringers  bolus 1,000 mL   vancomycin  (VANCOREADY) IVPB 1750 mg/350 mL    Indication::   Sepsis    -I have reviewed the patients home medicines and have made adjustments as needed  Critical interventions Treatment of septic shock   Cardiac Monitoring: The patient was maintained on a cardiac monitor.  I personally viewed and interpreted the cardiac monitored which showed an underlying rhythm of: Sinus rhythm  Social Determinants of Health:  Factors impacting patients care include:    Reevaluation: After the interventions noted above, I reevaluated the patient and found that they have :improved  Co morbidities that complicate the patient evaluation  Past Medical History:  Diagnosis Date   Abnormal CT of the abdomen 05/27/2018   Abnormal MRI of abdomen 04/14/2018   Acute pancreatitis 02/20/2019   Anemia    Anxiety disorder 04/09/2015   Arthritis    Asthma    Atrial fibrillation (HCC) 11/21/2008   Qualifier: Diagnosis of   By: Waddell, MD, CODY Danelle Fallow        CAD (coronary artery disease)    a. S/P PCI mid LAD (vision stent) 08/31/2004;  b. repeat cath 6/11 - no progression of CAD. c. Cath 10/2012 given moderate CAD on cardiac CT - no change from prior cath.   Centrilobular emphysema (HCC) 10/08/2022   Chest CT 08/04/2022     Chest pain 12/16/2016   CHEST PAIN UNSPECIFIED 11/21/2008   Qualifier: Diagnosis of   By: Ardelia, EMT-P, Tara         CHF (congestive heart failure) (HCC), reduced LVEF 35 to 40% by  TTE 11/19/2022, new decrease compared to prior echocardiogram 2022.  Likely tachycardia mediated in the setting of A-fib RVR 12/17/2022   Chronic respiratory failure with hypoxia (HCC) 02/24/2017   Chronic systolic CHF (congestive heart failure) (HCC) 01/14/2023   Cirrhosis of liver without ascites (HCC) 04/14/2018   CKD (chronic kidney disease), stage III (HCC) 02/20/2019   COPD (chronic obstructive pulmonary disease) (HCC)    COPD without exacerbation (HCC) 05/15/2009   Qualifier: Diagnosis of   By: Jude MD, Harden GAILS.         COVID-19 virus infection    Cyst of nipple, right 11/03/2020   Degeneration of lumbar intervertebral disc 04/09/2015   Diverticulitis    Dyslipidemia    Essential hypertension 10/15/2008   Qualifier: Diagnosis of   By: Rolan Hough  Gallstones    GERD 10/15/2008   Qualifier: Diagnosis of   By: Rolan Hough         High risk medication use 04/09/2015   History of COVID-19 01/24/19 02/20/2019   History of kidney stones    History of pelvic fracture 01/01/2020   History of tattoo 08/23/2016   History of UTI 11/28/2019   Hypercoagulable state due to persistent atrial fibrillation (HCC) 03/26/2022   Hyperlipidemia 10/15/2008   Qualifier: Diagnosis of   By: Rolan Hough         Hypertension    Hyperthyroidism    HYPERTRIGLYCERIDEMIA 10/15/2008   Qualifier: Diagnosis of   By: Rolan Hough         Hypotension 06/13/2019   Idiopathic acute pancreatitis 04/14/2018   Incisional hernia, without obstruction or gangrene 02/05/2016   Infected pancreatic pseudocyst 06/07/2019   Insomnia 04/09/2015   Iron deficiency anemia 05/03/2022   Kidney mass 07/06/2017   Following with urology. Chao.  Recommend follow up. Last seen 01/2018  Refuses to see again.   CT okay 04/2021     Long term (current) use of anticoagulants [Z79.01] 06/10/2015   Melanoma (HCC)    melanoma removed from neck    Mild episode of recurrent major depressive disorder (HCC) 08/27/2015    NEPHROLITHIASIS 10/15/2008   Qualifier: Diagnosis of   By: Rolan Hough         OA (osteoarthritis) of hip 12/18/2014   On home oxygen therapy    2L q hs (11/08/2012),  06/04/19 pt. not on oxygen   OSA (obstructive sleep apnea)    a. cpap noncompliance- patient reports on 12/10/14- does not use CPAP   Palpitations    a. 10/2012 - s/p LINQ loop recorder to assess for arrhythmia.    Pancreatic cyst 04/14/2018   Pancreatic pseudocyst 06/07/2019   Pancreatitis    Paroxysmal A-fib (HCC) 11/21/2008   a. S/P PVI 2/11 and 9/11   Paroxysmal atrial fibrillation (HCC) 01/14/2023   Pelvic fracture (HCC) 12/31/2019   Persistent atrial fibrillation (HCC) 06/08/2022   PICC (peripherally inserted central catheter) in place    Pneumonia    Protein-calorie malnutrition, severe 06/12/2019   Pseudocyst of pancreas 03/06/2019   Pure hypercholesterolemia    S/P coronary artery stent placement    SHORTNESS OF BREATH 03/19/2009   Qualifier: Diagnosis of   By: Kelsie MD, James         Sleep apnea 05/17/2009   Qualifier: Diagnosis of   By: Jude MD, Harden GAILS.         SVT (supraventricular tachycardia) (HCC)    a. Atypical AVNRT of the slow pathway   Thoracic aortic aneurysm (HCC) 11/13/2020   Type II diabetes mellitus (HCC)    Unstable angina (HCC) 10/15/2008   Qualifier: Diagnosis of   By: Rolan Hough            Dispostion: Signed out to Dr. Bari     Final Clinical Impression(s) / ED Diagnoses Final diagnoses:  None     @PCDICTATION @    Mannie Pac T, DO 01/28/23 1503    Mannie Pac T, DO 01/28/23 1504    Mannie Pac T, DO 01/28/23 1629    Mannie Pac T, DO 01/29/23 1115    Mannie Pac T, DO 01/29/23 1115

## 2023-01-28 NOTE — Progress Notes (Signed)
 ED Pharmacy Antibiotic Sign Off An antibiotic consult was received from an ED provider for vancomycin  per pharmacy dosing for sepsis. A chart review was completed to assess appropriateness.   The following one time order(s) were placed:  Vancomycin  1750mg  IV x1  Further antibiotic and/or antibiotic pharmacy consults should be ordered by the admitting provider if indicated.   Thank you for allowing pharmacy to be a part of this patient's care.   Sharyne LILLETTE Glatter, [facility]  Clinical Pharmacist 01/28/23 1:48 PM

## 2023-01-28 NOTE — Progress Notes (Signed)
 Elink following for sepsis protocol.

## 2023-01-28 NOTE — ED Triage Notes (Signed)
 Present for eval of cough, congestion x 1 week. Patient very pale. Fever, hypotension on arrival. O2 sat 90% room air. Skin tear to right upper arm s/p fall. Patient has pacemaker. (12/2)

## 2023-01-28 NOTE — Procedures (Signed)
 Central Venous Catheter Insertion Procedure Note  JASHAD DEPAULA  [id number]  [birth year]  Date:01/28/23  Time:7:27 PM   Provider Performing:Miel Wisener JAYSON Sharps   Procedure: Insertion of Non-tunneled Central Venous [phone]) with US  guidance ([id number])   Indication(s) Medication administration  Consent Risks of the procedure as well as the alternatives and risks of each were explained to the patient and/or caregiver.  Consent for the procedure was obtained and is signed in the bedside chart  Anesthesia Topical only with 1% lidocaine    Timeout Verified patient identification, verified procedure, site/side was marked, verified correct patient position, special equipment/implants available, medications/allergies/relevant history reviewed, required imaging and test results available.  Sterile Technique Maximal sterile technique including full sterile barrier drape, hand hygiene, sterile gown, sterile gloves, mask, hair covering, sterile ultrasound probe cover (if used).  Procedure Description Area of catheter insertion was cleaned with chlorhexidine  and draped in sterile fashion.  With real-time ultrasound guidance a central venous catheter was placed into the right internal jugular vein. Nonpulsatile blood flow and easy flushing noted in all ports.  The catheter was sutured in place and sterile dressing applied.  Complications/Tolerance None; patient tolerated the procedure well. Chest X-ray is ordered to verify placement for internal jugular or subclavian cannulation.   Chest x-ray is not ordered for femoral cannulation.  EBL Minimal  Specimen(s) None   Sherlean Sharps AGACNP-BC   [facility] 01/28/2023, 7:28 PM  Please see [email or web address] for pager details.  From 7A-7P if no response, please call [phone]. After hours, please call ELink [phone].

## 2023-01-28 NOTE — Sepsis Progress Note (Signed)
Notified bedside nurse of need to draw 3rd lactic acid.

## 2023-01-28 NOTE — Progress Notes (Signed)
 PHARMACY - ANTICOAGULATION CONSULT NOTE  Pharmacy Consult for heparin  Indication: atrial fibrillation  Allergies  Allergen Reactions   Penicillins Hives and Shortness Of Breath   Levaquin  [Levofloxacin ] Rash and Other (See Comments)    Alters mood    Patient Measurements:   Heparin  Dosing Weight: 87.3 kg    Vital Signs: Temp: 98 F (36.7 C) (01/03 1303) Temp Source: Oral (01/03 1303) BP: 102/67 (01/03 1620) Pulse Rate: 77 (01/03 1620)  Labs: Recent Labs    01/28/23 1302  HGB 8.6*  HCT 25.5*  PLT 204  APTT 41*  LABPROT 35.4*  INR 3.5*  CREATININE 1.84*    Estimated Creatinine Clearance: 40.4 mL/min (A) (by C-G formula based on SCr of 1.84 mg/dL (H)).   Medical History: Past Medical History:  Diagnosis Date   Abnormal CT of the abdomen 05/27/2018   Abnormal MRI of abdomen 04/14/2018   Acute pancreatitis 02/20/2019   Anemia    Anxiety disorder 04/09/2015   Arthritis    Asthma    Atrial fibrillation (HCC) 11/21/2008   Qualifier: Diagnosis of   By: Waddell, MD, CODY Danelle Fallow        CAD (coronary artery disease)    a. S/P PCI mid LAD (vision stent) 08/31/2004;  b. repeat cath 6/11 - no progression of CAD. c. Cath 10/2012 given moderate CAD on cardiac CT - no change from prior cath.   Centrilobular emphysema (HCC) 10/08/2022   Chest CT 08/04/2022     Chest pain 12/16/2016   CHEST PAIN UNSPECIFIED 11/21/2008   Qualifier: Diagnosis of   By: Ardelia, EMT-P, Tara         CHF (congestive heart failure) (HCC), reduced LVEF 35 to 40% by TTE 11/19/2022, new decrease compared to prior echocardiogram 2022.  Likely tachycardia mediated in the setting of A-fib RVR 12/17/2022   Chronic respiratory failure with hypoxia (HCC) 02/24/2017   Chronic systolic CHF (congestive heart failure) (HCC) 01/14/2023   Cirrhosis of liver without ascites (HCC) 04/14/2018   CKD (chronic kidney disease), stage III (HCC) 02/20/2019   COPD (chronic obstructive pulmonary disease) (HCC)    COPD  without exacerbation (HCC) 05/15/2009   Qualifier: Diagnosis of   By: Jude MD, Harden GAILS.         COVID-19 virus infection    Cyst of nipple, right 11/03/2020   Degeneration of lumbar intervertebral disc 04/09/2015   Diverticulitis    Dyslipidemia    Essential hypertension 10/15/2008   Qualifier: Diagnosis of   By: Rolan Hough         Gallstones    GERD 10/15/2008   Qualifier: Diagnosis of   By: Rolan Hough         High risk medication use 04/09/2015   History of COVID-19 01/24/19 02/20/2019   History of kidney stones    History of pelvic fracture 01/01/2020   History of tattoo 08/23/2016   History of UTI 11/28/2019   Hypercoagulable state due to persistent atrial fibrillation (HCC) 03/26/2022   Hyperlipidemia 10/15/2008   Qualifier: Diagnosis of   By: Rolan Hough         Hypertension    Hyperthyroidism    HYPERTRIGLYCERIDEMIA 10/15/2008   Qualifier: Diagnosis of   By: Rolan Hough         Hypotension 06/13/2019   Idiopathic acute pancreatitis 04/14/2018   Incisional hernia, without obstruction or gangrene 02/05/2016   Infected pancreatic pseudocyst 06/07/2019   Insomnia 04/09/2015   Iron deficiency anemia 05/03/2022   Kidney mass 07/06/2017  Following with urology. Chao.  Recommend follow up. Last seen 01/2018  Refuses to see again.   CT okay 04/2021     Long term (current) use of anticoagulants [Z79.01] 06/10/2015   Melanoma (HCC)    melanoma removed from neck    Mild episode of recurrent major depressive disorder (HCC) 08/27/2015   NEPHROLITHIASIS 10/15/2008   Qualifier: Diagnosis of   By: Rolan Hough         OA (osteoarthritis) of hip 12/18/2014   On home oxygen therapy    2L q hs (11/08/2012),  06/04/19 pt. not on oxygen   OSA (obstructive sleep apnea)    a. cpap noncompliance- patient reports on 12/10/14- does not use CPAP   Palpitations    a. 10/2012 - s/p LINQ loop recorder to assess for arrhythmia.    Pancreatic cyst 04/14/2018   Pancreatic  pseudocyst 06/07/2019   Pancreatitis    Paroxysmal A-fib (HCC) 11/21/2008   a. S/P PVI 2/11 and 9/11   Paroxysmal atrial fibrillation (HCC) 01/14/2023   Pelvic fracture (HCC) 12/31/2019   Persistent atrial fibrillation (HCC) 06/08/2022   PICC (peripherally inserted central catheter) in place    Pneumonia    Protein-calorie malnutrition, severe 06/12/2019   Pseudocyst of pancreas 03/06/2019   Pure hypercholesterolemia    S/P coronary artery stent placement    SHORTNESS OF BREATH 03/19/2009   Qualifier: Diagnosis of   By: Kelsie MD, James         Sleep apnea 05/17/2009   Qualifier: Diagnosis of   By: Jude MD, Harden GAILS.         SVT (supraventricular tachycardia) (HCC)    a. Atypical AVNRT of the slow pathway   Thoracic aortic aneurysm (HCC) 11/13/2020   Type II diabetes mellitus (HCC)    Unstable angina (HCC) 10/15/2008   Qualifier: Diagnosis of   By: Rolan Hough          Medications:  Scheduled:   atropine        mometasone -formoterol   2 puff Inhalation BID   pantoprazole  (PROTONIX ) IV  40 mg Intravenous Q24H   revefenacin   175 mcg Nebulization Daily   vancomycin  variable dose per unstable renal function (pharmacist dosing)   Does not apply See admin instructions   Infusions:   sodium chloride      DOBUTamine      heparin      norepinephrine  (LEVOPHED ) 16 mg in dextrose  5 % 250 mL (0.064 mg/mL) infusion     piperacillin -tazobactam (ZOSYN )  IV     PRN: Place/Maintain arterial line **AND** sodium chloride , albuterol , atropine , docusate sodium , polyethylene glycol  Assessment: Patient admitted for cardiogenic vs. Septic shock. On Eliquis  PTA. Last dose was 1/2 at 17:30. Will need heparin  levels and aPTT until they are correlating.  Hb 8.6, plt 204.   Goal of Therapy:  Heparin  level 0.3-0.7 units/ml aPTT 66-102 seconds Monitor platelets by anticoagulation protocol: Yes   Plan:  Start heparin  infusion at 1200 units/hr Continue to monitor H&H and platelets Heparin  level  / aPTT in 8 hours  Daily heparin  level and CBC  Rankin Sams 01/28/2023,6:34 PM

## 2023-01-28 NOTE — ED Notes (Signed)
 Following provider assessment, decision made to have patient transported to hospital via ambulance. EMS called by Eula Fried., NP.

## 2023-01-28 NOTE — ED Notes (Signed)
 Patient is being discharged from the Urgent Care and sent to the Emergency Department via EMS . Per M. Stanhope, NP, patient is in need of higher level of care due to hypotension, fever. Patient is aware and verbalizes understanding of plan of care.  Vitals:   01/28/23 1136 01/28/23 1143  BP: (!) 61/43   Pulse: 73   Temp: 99.9 F (37.7 C)   SpO2: 90% (!) 89%

## 2023-01-28 NOTE — Progress Notes (Signed)
 Pharmacy Antibiotic Note  Steve Anthony is a 73 y.o. male admitted on 01/28/2023 with increasing generalized weakness over 2 weeks, poor PO intake, and cough found to be hypotensive with SBP in 60's, 89% on room air. Past medical history of COPD, OSA, HFrEF, CAD, HTN, PAF on eliquis , sick sinus syndrome s/p PPM 12/27/22, CKD, DMT2, GERD, cirrhosis, hyperthyroidism, pancreatitis, and IDA. Antibiotics started for shock (? Septic +/- cardiogenic). Pharmacy has been consulted for vancomycin /Zosyn  dosing.  Lactic acid 9.5. WBC 23.2. BP was 56/39 >> improved to 102/67 (MAP 77) on vasopressors. Afebrile. Scr 1.84 (B/L ~ 1.1-1.2). Was given 1750 mg vancomycin  and 3.375 g Zosyn  in ED.   Plan: Zosyn  3.375g IV q8h (4 hour infusion). Will adjust based on Scr trend and UOP, for now estimates > 20 mL/min.  Vancomcyin variable dosing given elevated Scr. Obtain level in AM.      Temp (24hrs), Avg:99 F (37.2 C), Min:98 F (36.7 C), Max:99.9 F (37.7 C)  Recent Labs  Lab 01/28/23 1302 01/28/23 1317 01/28/23 1453  WBC 23.2*  --   --   CREATININE 1.84*  --   --   LATICACIDVEN  --  8.8* 9.5*    Estimated Creatinine Clearance: 40.4 mL/min (A) (by C-G formula based on SCr of 1.84 mg/dL (H)).    Allergies  Allergen Reactions   Penicillins Hives and Shortness Of Breath   Levaquin  [Levofloxacin ] Rash and Other (See Comments)    Alters mood    Antimicrobials this admission: Vancomycin  1/3 >> c Zosyn  1/3 >> c  Dose adjustments this admission: N/a  Microbiology results: 1/3 BCx: pending 1/3 MRSA PCR: pending  Thank you for allowing pharmacy to be a part of this patient's care.  Rankin Sams 01/28/2023 5:32 PM

## 2023-01-28 NOTE — ED Notes (Signed)
 Oxygen 4L [place] applied for O2 sate 88%

## 2023-01-28 NOTE — H&P (Signed)
 NAME:  Steve Anthony, MRN:  [id number], DOB:  October 06, 1950, LOS: 0 ADMISSION DATE:  01/28/2023, CONSULTATION DATE:  01/27/22 REFERRING MD:  Dr. Mannie, CHIEF COMPLAINT:  hypotension   History of Present Illness:   54 yoM with PMH significant for but not limited to COPD, OSA, HFrEF, CAD, HTN, PAF on eliquis , sick sinus syndrome s/p PPM 12/27/22, CKD, DMT2, GERD, cirrhosis, hyperthyroidism, pancreatitis, chronic back pain, melanoma, and IDA who initially presented to urgent care today with complaints of increasing generalized weakness over 2 weeks, poor PO intake, and cough found to be hypotensive with SBP in 60's, 89% on room air.  Daughter at bedside who is an ER nurse and able to provide more history.  Reports pt has had progressive decline over several weeks.  Had staph infection in right forearm after a skin biopsy treated in November, then had PPM and ablation with EP on 12/2 for sick sinus and prior failed treatment for afib.  Was admitted 12/19 with dehydration and poor PO intake and ongoing low BP's in outpt setting found to have elevated lactic thought possibly to home jardiance  and metformin .  Taken off lopressor , jardiance  in December.  BP improved and he was discharged home next day to be able to see his mother prior to her passing away.  Since, pt continues to have poor PO intake and reports nothing taste good.  Reported 67 lb unintentional wt loss since June 2024.  Since his discharge, pt also has had dizzy spells and multiple falls at home, last on 12/31 in which he was evaluated at [facility] ER and discharged home.  Sleeps a lot per daughter.  Progressive BLE swelling despite taking lasix  at home.  Ongoing cough but now productive yellow over last week but denies any SOB.  Denies any pain, fever, N/V/D, chest/ abdominal pain, or hematemesis, hemoptysis, or bloody stools.  Reports some dysuria.  Last took meds, including eliquis  01/26/22 PM.  In ER, temp 99., initial SBP in 60's, and 89% on room air.   Given 2L LR and placed on peripheral NE for ongoing hypotension.      Labs noted for WBC 23, lactic 8.8 rising to 9.5 despite fluids, Hgb 8.6 (down from previous 11.1 on 12/20).  CTH and CT chest/ abd/ pelvis ordered and pending.  CXR showed mild vascular congestion and possible LLL retrocardiac opacity.  Bedside pocus echo per EDP concerning for poor EF, possibly 25%.  Pt has not required any supplemental O2 in ER, maintaining on room air.  PCCM called for admit.   Pertinent  Medical History   Past Medical History:  Diagnosis Date   Abnormal CT of the abdomen 05/27/2018   Abnormal MRI of abdomen 04/14/2018   Acute pancreatitis 02/20/2019   Anemia    Anxiety disorder 04/09/2015   Arthritis    Asthma    Atrial fibrillation (HCC) 11/21/2008   Qualifier: Diagnosis of   By: Waddell, MD, CODY Danelle Fallow        CAD (coronary artery disease)    a. S/P PCI mid LAD (vision stent) 08/31/2004;  b. repeat cath 6/11 - no progression of CAD. c. Cath 10/2012 given moderate CAD on cardiac CT - no change from prior cath.   Centrilobular emphysema (HCC) 10/08/2022   Chest CT 08/04/2022     Chest pain 12/16/2016   CHEST PAIN UNSPECIFIED 11/21/2008   Qualifier: Diagnosis of   By: Ardelia EMT-P, Tara         CHF (congestive heart failure) (  HCC), reduced LVEF 35 to 40% by TTE 11/19/2022, new decrease compared to prior echocardiogram 2022.  Likely tachycardia mediated in the setting of A-fib RVR 12/17/2022   Chronic respiratory failure with hypoxia (HCC) 02/24/2017   Chronic systolic CHF (congestive heart failure) (HCC) 01/14/2023   Cirrhosis of liver without ascites (HCC) 04/14/2018   CKD (chronic kidney disease), stage III (HCC) 02/20/2019   COPD (chronic obstructive pulmonary disease) (HCC)    COPD without exacerbation (HCC) 05/15/2009   Qualifier: Diagnosis of   By: Jude MD, Harden GAILS.         COVID-19 virus infection    Cyst of nipple, right 11/03/2020   Degeneration of lumbar intervertebral disc  04/09/2015   Diverticulitis    Dyslipidemia    Essential hypertension 10/15/2008   Qualifier: Diagnosis of   By: Rolan Hough         Gallstones    GERD 10/15/2008   Qualifier: Diagnosis of   By: Rolan Hough         High risk medication use 04/09/2015   History of COVID-19 01/24/19 02/20/2019   History of kidney stones    History of pelvic fracture 01/01/2020   History of tattoo 08/23/2016   History of UTI 11/28/2019   Hypercoagulable state due to persistent atrial fibrillation (HCC) 03/26/2022   Hyperlipidemia 10/15/2008   Qualifier: Diagnosis of   By: Rolan Hough         Hypertension    Hyperthyroidism    HYPERTRIGLYCERIDEMIA 10/15/2008   Qualifier: Diagnosis of   By: Rolan Hough         Hypotension 06/13/2019   Idiopathic acute pancreatitis 04/14/2018   Incisional hernia, without obstruction or gangrene 02/05/2016   Infected pancreatic pseudocyst 06/07/2019   Insomnia 04/09/2015   Iron deficiency anemia 05/03/2022   Kidney mass 07/06/2017   Following with urology. Chao.  Recommend follow up. Last seen 01/2018  Refuses to see again.   CT okay 04/2021     Long term (current) use of anticoagulants [Z79.01] 06/10/2015   Melanoma (HCC)    melanoma removed from neck    Mild episode of recurrent major depressive disorder (HCC) 08/27/2015   NEPHROLITHIASIS 10/15/2008   Qualifier: Diagnosis of   By: Rolan Hough         OA (osteoarthritis) of hip 12/18/2014   On home oxygen therapy    2L q hs (11/08/2012),  06/04/19 pt. not on oxygen   OSA (obstructive sleep apnea)    a. cpap noncompliance- patient reports on 12/10/14- does not use CPAP   Palpitations    a. 10/2012 - s/p LINQ loop recorder to assess for arrhythmia.    Pancreatic cyst 04/14/2018   Pancreatic pseudocyst 06/07/2019   Pancreatitis    Paroxysmal A-fib (HCC) 11/21/2008   a. S/P PVI 2/11 and 9/11   Paroxysmal atrial fibrillation (HCC) 01/14/2023   Pelvic fracture (HCC) 12/31/2019   Persistent atrial  fibrillation (HCC) 06/08/2022   PICC (peripherally inserted central catheter) in place    Pneumonia    Protein-calorie malnutrition, severe 06/12/2019   Pseudocyst of pancreas 03/06/2019   Pure hypercholesterolemia    S/P coronary artery stent placement    SHORTNESS OF BREATH 03/19/2009   Qualifier: Diagnosis of   By: Kelsie MD, James         Sleep apnea 05/17/2009   Qualifier: Diagnosis of   By: Jude MD, Harden GAILS.         SVT (supraventricular tachycardia) (HCC)    a. Atypical  AVNRT of the slow pathway   Thoracic aortic aneurysm (HCC) 11/13/2020   Type II diabetes mellitus (HCC)    Unstable angina (HCC) 10/15/2008   Qualifier: Diagnosis of   By: Rolan Hough         [facility] Events: Including procedures, antibiotic start and stop dates in addition to other pertinent events   1/3 Admitted   Interim History / Subjective:   Objective   Blood pressure (!) 73/49, pulse 72, temperature 98 F (36.7 C), temperature source Oral, resp. rate 15, SpO2 100%.        Intake/Output Summary (Last 24 hours) at 01/28/2023 1521 Last data filed at 01/28/2023 1510 Gross per 24 hour  Intake 1050 ml  Output --  Net 1050 ml   There were no vitals filed for this visit.  Examination: General:  AoC ill appearing elderly male lying in bed in NAD HEENT: MM pink/dry, pupils 3/r, anicteric, elevated JVP Neuro: somnolent but easily awakens, oriented x3, MAE  CV: paced, no murmur, pacer site healed, no erythema/ tenderness PULM:  non labored, clear anteriorly, currently on RA, congested cough GI: soft, bs+, NT/ ND Extremities: warm/dry, +3 pitting LE edema, small skin tears to R arm, prior biopsy site healing without erythema or purulence  Skin: older appearing ecchymosis to left chest wall, left buttock/ hip,  small area to RUQ,  posterior   Resolved Hospital Problem list    Assessment & Plan:   Shock- concerning for AoC HFrEF/ RV failure with cardiogenic +/- septic shock w/  leukocytosis/ low grade temp, r/o LLL PNA, UTI with dysuria and r/o ABLA with Hgb drop since 12/20 with frequent falls/ dizziness at home on eliquis .  Malnutrition certainly contributing to third spacing.  P:  - admit to ICU - cont NE for MAP goal > 65 - CVL placement on arrival to ICU, consider aline if increasing pressor needs.  - trend COOX/ lactic/ CVP - echo - check cortisol - check lipase, only has pancreatic head per daughter, ?chronic  - follow cultures, trend PCT, check RVP, MRSA PCR, UA, strep urine  ag and trend WBC/ fever curve  - supplemental O2 prn but currently on RA - cont broad spectrum abx for now- vanc/ zosyn  - trend H/H q6 - pending CTH and CT chest/ abd/ pelvis   AGMA/ lactic acidosis AKI - prior concern for starvation ketosis vs lactic acidosis secondary to metformin / jardiance  - check UA, monitor UOP  - bladder scan prn, consider foley - Trend BMP / strict I/Os/ daily wts - Replace electrolytes as indicated - Avoid nephrotoxic agents, ensure adequate renal perfusion   AoC HFrEF and RV failure  Sick sinus syndrome s/p PPM 12/27/22 Afib s/p ablation - looks either junction vs apaced at times.  Rate regular - prior echo 35-40%, indeterminate diastolic, RV mod reduced  - echo pending - appreciate further recs per AHF team   Acute normocytic anemia/ hx IDA Coagulopathy/ elevated INR (on eliquis ) - stat type and screen and trend H/H q6hrs, coags in am - check stool for FOB and iron studies - concerning for internal bleeding given multiple falls on eliquis , CT c/a/p pending   DMT2 - prior A1c 6.2 on 12/27/22 - CBG q4, hold SSI prn as glucose in 90's. Monitor for hypoglycemia - cont to hold metformin .  Taken off jardiance  in December  GERD - PPI   Severe protein calorie malnutrition Severe deconditioning, ?FTT, frequent falls - nearly 70 lb unintentional wt loss reported since 06/2022 -  RD consult, npo for tonight pending further workup   Hx COPD,  OSA (non compliant w/ CPAP x 52yrs per pt) - no O2 thus far, supplemental PRN - prn albuterol , cont home breztri - pulm hygiene   Chronic back pain  Diabetic neuropathy  - 3 prior back surgeries per wife - hold home oxy/ baclofen / gabapentin  while NPO for now, consider restarting 1/4   GOC - pt verbalized wishes of DNR/ DNI but wishes for all other aggressive medical care and interventions including CVL/ pressors. Daughter at bedside, present during conversation.   Best Practice (right click and Reselect all SmartList Selections daily)   Diet/type: NPO DVT prophylaxis SCD Pressure ulcer(s): N/A> skin tear to R forearm GI prophylaxis: PPI Lines: N/A Foley:  N/A Code Status:  DNR/ DNI per pt's expressed wishes, daughter present- ok with CVL/ pressors, other aggressive medical care Last date of multidisciplinary goals of care discussion [1/3]  Daughter at bedside.  Wife went home to get phone and belongings.  Labs   CBC: Recent Labs  Lab 01/28/23 1302  WBC 23.2*  NEUTROABS 21.5*  HGB 8.6*  HCT 25.5*  MCV 95.1  PLT 204    Basic Metabolic Panel: Recent Labs  Lab 01/28/23 1302  NA 137  K 4.2  CL 96*  CO2 12*  GLUCOSE 96  BUN 8  CREATININE 1.84*  CALCIUM  7.6*   GFR: Estimated Creatinine Clearance: 40.4 mL/min (A) (by C-G formula based on SCr of 1.84 mg/dL (H)). Recent Labs  Lab 01/28/23 1302 01/28/23 1317 01/28/23 1453  WBC 23.2*  --   --   LATICACIDVEN  --  8.8* 9.5*    Liver Function Tests: Recent Labs  Lab 01/28/23 1302  AST 33  ALT 15  ALKPHOS 74  BILITOT 1.7*  PROT 3.9*  ALBUMIN  1.7*   No results for input(s): LIPASE, AMYLASE in the last 168 hours. No results for input(s): AMMONIA in the last 168 hours.  ABG    Component Value Date/Time   PHART 7.265 (L) 06/07/2019 1035   PCO2ART 42.7 06/07/2019 1035   PO2ART 192 (H) 06/07/2019 1035   HCO3 18.0 (L) 01/14/2023 0543   TCO2 21 (L) 06/07/2019 1035   ACIDBASEDEF 7.3 (H)  01/14/2023 0543   O2SAT 51.3 01/14/2023 0543     Coagulation Profile: Recent Labs  Lab 01/28/23 1302  INR 3.5*    Cardiac Enzymes: No results for input(s): CKTOTAL, CKMB, CKMBINDEX, TROPONINI in the last 168 hours.  HbA1C: Hgb A1c MFr Bld  Date/Time Value Ref Range Status  12/27/2022 06:47 PM 6.2 (H) 4.8 - 5.6 % Final    Comment:    (NOTE) Pre diabetes:          5.7%-6.4%  Diabetes:              >6.4%  Glycemic control for   <7.0% adults with diabetes   06/09/2022 03:09 AM 6.1 (H) 4.8 - 5.6 % Final    Comment:    (NOTE) Pre diabetes:          5.7%-6.4%  Diabetes:              >6.4%  Glycemic control for   <7.0% adults with diabetes     CBG: No results for input(s): GLUCAP in the last 168 hours.  Review of Systems:   Review of Systems  Constitutional:  Positive for malaise/fatigue and weight loss. Negative for chills and fever.  Eyes:  Positive for blurred vision.  Respiratory:  Positive  for cough and sputum production. Negative for hemoptysis, shortness of breath and wheezing.   Cardiovascular:  Positive for leg swelling. Negative for chest pain and palpitations.  Gastrointestinal:  Negative for abdominal pain, blood in stool, diarrhea, melena, nausea and vomiting.  Genitourinary:  Positive for dysuria.  Musculoskeletal:  Positive for falls.  Neurological:  Positive for dizziness and weakness. Negative for focal weakness.   Past Medical History:  He,  has a past medical history of Abnormal CT of the abdomen (05/27/2018), Abnormal MRI of abdomen (04/14/2018), Acute pancreatitis (02/20/2019), Anemia, Anxiety disorder (04/09/2015), Arthritis, Asthma, Atrial fibrillation (HCC) (11/21/2008), CAD (coronary artery disease), Centrilobular emphysema (HCC) (10/08/2022), Chest pain (12/16/2016), CHEST PAIN UNSPECIFIED (11/21/2008), CHF (congestive heart failure) (HCC), reduced LVEF 35 to 40% by TTE 11/19/2022, new decrease compared to prior echocardiogram 2022.   Likely tachycardia mediated in the setting of A-fib RVR (12/17/2022), Chronic respiratory failure with hypoxia (HCC) (02/24/2017), Chronic systolic CHF (congestive heart failure) (HCC) (01/14/2023), Cirrhosis of liver without ascites (HCC) (04/14/2018), CKD (chronic kidney disease), stage III (HCC) (02/20/2019), COPD (chronic obstructive pulmonary disease) (HCC), COPD without exacerbation (HCC) (05/15/2009), COVID-19 virus infection, Cyst of nipple, right (11/03/2020), Degeneration of lumbar intervertebral disc (04/09/2015), Diverticulitis, Dyslipidemia, Essential hypertension (10/15/2008), Gallstones, GERD (10/15/2008), High risk medication use (04/09/2015), History of COVID-19 01/24/19 (02/20/2019), History of kidney stones, History of pelvic fracture (01/01/2020), History of tattoo (08/23/2016), History of UTI (11/28/2019), Hypercoagulable state due to persistent atrial fibrillation (HCC) (03/26/2022), Hyperlipidemia (10/15/2008), Hypertension, Hyperthyroidism, HYPERTRIGLYCERIDEMIA (10/15/2008), Hypotension (06/13/2019), Idiopathic acute pancreatitis (04/14/2018), Incisional hernia, without obstruction or gangrene (02/05/2016), Infected pancreatic pseudocyst (06/07/2019), Insomnia (04/09/2015), Iron deficiency anemia (05/03/2022), Kidney mass (07/06/2017), Long term (current) use of anticoagulants [Z79.01] (06/10/2015), Melanoma (HCC), Mild episode of recurrent major depressive disorder (HCC) (08/27/2015), NEPHROLITHIASIS (10/15/2008), OA (osteoarthritis) of hip (12/18/2014), On home oxygen therapy, OSA (obstructive sleep apnea), Palpitations, Pancreatic cyst (04/14/2018), Pancreatic pseudocyst (06/07/2019), Pancreatitis, Paroxysmal A-fib (HCC) (11/21/2008), Paroxysmal atrial fibrillation (HCC) (01/14/2023), Pelvic fracture (HCC) (12/31/2019), Persistent atrial fibrillation (HCC) (06/08/2022), PICC (peripherally inserted central catheter) in place, Pneumonia, Protein-calorie malnutrition, severe (06/12/2019),  Pseudocyst of pancreas (03/06/2019), Pure hypercholesterolemia, S/P coronary artery stent placement, SHORTNESS OF BREATH (03/19/2009), Sleep apnea (05/17/2009), SVT (supraventricular tachycardia) (HCC), Thoracic aortic aneurysm (HCC) (11/13/2020), Type II diabetes mellitus (HCC), and Unstable angina (HCC) (10/15/2008).   Surgical History:   Past Surgical History:  Procedure Laterality Date   ATRIAL ABLATION SURGERY  2007   S/P slow pathway ablation for atypical AVNRT    AV NODE ABLATION N/A 12/27/2022   Procedure: AV NODE ABLATION;  Surgeon: Inocencio Soyla Lunger, MD;  Location: [facility] INVASIVE CV LAB;  Service: Cardiovascular;  Laterality: N/A;   BIOPSY  07/17/2018   Procedure: BIOPSY;  Surgeon: Wilhelmenia Aloha Raddle., MD;  Location: [facility] ENDOSCOPY;  Service: Gastroenterology;;   BIV PACEMAKER INSERTION CRT-P N/A 12/27/2022   Procedure: BIV PACEMAKER INSERTION CRT-P;  Surgeon: Inocencio Soyla Lunger, MD;  Location: [facility] INVASIVE CV LAB;  Service: Cardiovascular;  Laterality: N/A;   CARDIOVERSION N/A 04/13/2022   Procedure: CARDIOVERSION;  Surgeon: Mona Vinie BROCKS, MD;  Location: [facility] ENDOSCOPY;  Service: Cardiovascular;  Laterality: N/A;   CARDIOVERSION N/A 06/10/2022   Procedure: CARDIOVERSION;  Surgeon: Delford Maude BROCKS, MD;  Location: [facility] INVASIVE CV LAB;  Service: Cardiovascular;  Laterality: N/A;   CHOLECYSTECTOMY  ~ 2008   COLON RESECTION  02/2013    COLONOSCOPY  12/25/2007   Colonic polyp status post polypectomy. Internal hemorrhoids.    CORONARY ANGIOPLASTY WITH STENT PLACEMENT  08/30/2004   Vision;  to LAD    CYSTOSCOPY W/ STONE MANIPULATION  ~ 1998   once (11/08/2012)   EP Study  2008   Negative EP study in [place]   ESOPHAGOGASTRODUODENOSCOPY (EGD) WITH PROPOFOL  N/A 07/17/2018   Procedure: ESOPHAGOGASTRODUODENOSCOPY (EGD) WITH PROPOFOL ;  Surgeon: Wilhelmenia Aloha Raddle., MD;  Location: [facility] ENDOSCOPY;  Service: Gastroenterology;  Laterality: N/A;   FRACTURE SURGERY     LEFT HEART  CATHETERIZATION WITH CORONARY ANGIOGRAM N/A 11/10/2012   Procedure: LEFT HEART CATHETERIZATION WITH CORONARY ANGIOGRAM;  Surgeon: Lonni JONETTA Cash, MD;  Location: [facility] CATH LAB;  Service: Cardiovascular;  Laterality: N/A;   LOOP RECORDER IMPLANT N/A 11/10/2012   Medtronic LinQ implanted by Dr Kelsie for afib management   LUMBAR DISC SURGERY  2000; 2002   POSTERIOR LUMBAR FUSION  2004   PROSTATE SURGERY  ~ 2009   PVI/ CTI ablation  02/25/2009 and 09/2009   S/P afib/ CTI ablation   SPLENECTOMY, TOTAL N/A 06/07/2019   Procedure: SPLENECTOMY;  Surgeon: Aron Shoulders, MD;  Location: [facility] OR;  Service: General;  Laterality: N/A;   TIBIA FRACTURE SURGERY Right 1990   broke in 3 places; had rod put in (11/08/2012)   TIBIA HARDWARE REMOVAL Right ~ 1991   TONSILLECTOMY  1965   TOTAL HIP ARTHROPLASTY Right 12/18/2014   Procedure: RIGHT TOTAL HIP ARTHROPLASTY ANTERIOR APPROACH;  Surgeon: Dempsey Moan, MD;  Location: [facility] ORS;  Service: Orthopedics;  Laterality: Right;   UPPER ESOPHAGEAL ENDOSCOPIC ULTRASOUND (EUS) N/A 07/17/2018   Procedure: UPPER ESOPHAGEAL ENDOSCOPIC ULTRASOUND (EUS);  Surgeon: Wilhelmenia Aloha Raddle., MD;  Location: [facility] ENDOSCOPY;  Service: Gastroenterology;  Laterality: N/A;     Social History:   reports that he quit smoking about 18 years ago. His smoking use included cigarettes. He started smoking about 56 years ago. He has a 114 pack-year smoking history. He quit smokeless tobacco use about 18 years ago.  His smokeless tobacco use included chew. He reports that he does not currently use alcohol . He reports that he does not use drugs.   Family History:  His family history includes Diabetes in his father; Emphysema in his maternal grandfather and mother; Heart attack in his father; Heart disease in his father and paternal grandfather; Hypertension in his father. There is no history of Colon cancer, Esophageal cancer, Inflammatory bowel disease, Liver disease, Pancreatic cancer, Rectal  cancer, or Stomach cancer.   Allergies Allergies  Allergen Reactions   Penicillins Hives and Shortness Of Breath    Did it involve swelling of the face/tongue/throat, SOB, or low BP? Yes Did it involve sudden or severe rash/hives, skin peeling, or any reaction on the inside of your mouth or nose? No Did you need to seek medical attention at a hospital or doctor's office? Yes (emergency room) When did it last happen?  1974 If all above answers are NO, may proceed with cephalosporin use.     Levofloxacin  Rash and Other (See Comments)    mood     Home Medications  Prior to Admission medications   Medication Sig Start Date End Date Taking? Authorizing Provider  metFORMIN  (GLUCOPHAGE ) 1000 MG tablet Take 1,000 mg by mouth 2 (two) times daily. 01/24/23  Yes [provider]  apixaban  (ELIQUIS ) 5 MG TABS tablet Take 1 tablet (5 mg total) by mouth 2 (two) times daily. 12/30/22   Riddle, Suzann, NP  baclofen  (LIORESAL ) 10 MG tablet Take 10 mg by mouth every 8 (eight) hours as needed for muscle spasms.    [provider]  escitalopram  (LEXAPRO ) 10 MG tablet Take 10 mg by mouth daily. 08/06/22 08/06/23  [provider]  ferrous sulfate  325 (65 FE) MG tablet Take 325 mg by mouth daily with breakfast.    [provider]  furosemide  (LASIX ) 20 MG tablet Take 40 mg by mouth daily as needed for fluid or edema (as needed for 2 pound weight gain in 1 day or 4 pound weight gain in one week.). 09/09/21   [provider]  gabapentin  (NEURONTIN ) 300 MG capsule Take 1 capsule (300 mg total) by mouth 3 (three) times daily. 01/07/20   Pahwani, Rinka R, MD  Insulin  Pen Needle 29G X MISC 1 Device by Does not apply route 3 (three) times daily as needed (for use with insulin  pen). 06/18/19   Cindy Garnette POUR, MD  magnesium  oxide (MAG-OX) 400 MG tablet Take 1 tablet (400 mg total) by mouth daily. 06/12/22   Barrett, Shona MATSU, PA-C  Melatonin 10 MG CAPS Take 20 mg by mouth at  bedtime.    [provider]  Multiple Vitamins-Minerals (PRESERVISION AREDS) TABS Take 1 tablet by mouth 2 (two) times daily.    [provider]  nitroGLYCERIN  (NITROSTAT ) 0.4 MG SL tablet Place 0.4 mg under the tongue every 5 (five) minutes as needed for chest pain.    [provider]  Oxycodone  HCl 10 MG TABS Take 1 tablet by mouth daily as needed (For pain). Take 1 tablet every 4-6 hours as needed 04/09/22   [provider]  pantoprazole  (PROTONIX ) 40 MG tablet Take 1 tablet (40 mg total) by mouth 2 (two) times daily. 11/29/22   Charlanne Groom, MD  Tamsulosin  HCl (FLOMAX ) 0.4 MG CAPS Take 0.4 mg by mouth daily.    [provider]  zolpidem  (AMBIEN ) 5 MG tablet Take 0.5 tablets (2.5 mg total) by mouth at bedtime as needed for sleep. 05/06/22   Thurmond Cathlyn LABOR., MD     Critical care time: 61 mins       Lyle Pesa, MSN, AG-ACNP-BC [facility] 01/28/2023, 4:38 PM  See Amion for pager If no response to pager , please call [phone] until 7pm After 7:00 pm call Elink  [phone]

## 2023-01-28 NOTE — Progress Notes (Addendum)
 eLink Physician-Brief Progress Note Patient Name: Steve Anthony DOB: August 29, 1950 MRN: [id number]   Date of Service  01/28/2023  HPI/Events of Note  60 yoM with PMH significant for but not limited to COPD, OSA, HFrEF, CAD, HTN, PAF on eliquis , sick sinus syndrome s/p PPM 12/27/22, CKD, DMT2, GERD, cirrhosis, hyperthyroidism, pancreatitis, chronic back pain, melanoma, and IDA who initially presented to urgent care today with acute heart failure and RV failure  Lactic acid is 8.2, downtrending from 9.5.  Magnesium  1.1  eICU Interventions  Replete magnesium  with 6 g  Add calcium   Trend lactic   2316 - Post tussive Nauseous - add 1x Zofran .  Add on Robitussin  0133 -rhinovirus positive.  Droplet precautions.  BNP elevated, no intervention for that.  Intervention Category Intermediate Interventions: Electrolyte abnormality - evaluation and management  [name] 01/28/2023, 9:29 PM

## 2023-01-28 NOTE — ED Provider Notes (Signed)
 [facility]    CSN: [id number] Arrival date & time: 01/28/23  1018      History   Chief Complaint Chief Complaint  Patient presents with   Cough   Nasal Congestion    HPI Steve Anthony is a 73 y.o. male.   Steve Anthony is a 73 y.o. male presenting for chief complaint of fatigue, cough, and nasal congestion that started approximately 7 days ago. He has many comorbidities including CHF, CAD, COPD, HTN, Afib, Pacemaker (placed December 27, 2022), Type 2 diabetes, CKD, Liver cirrhosis, etc. He denies shortness of breath, chest pain, and palpitations. States he feels fatigued and nauseous. No recent syncope, though reports recent fall 2 days ago.  BP low in the 50s/30s. States BP is normally in the 70s/50s. Reports he feels like he may pass out currently. Taking antibiotics for skin tears on arm as a result of fall. Currently with low grade oral temp at 99.9. Oxygen 89% on room air.    Cough   Past Medical History:  Diagnosis Date   Abnormal CT of the abdomen 05/27/2018   Abnormal MRI of abdomen 04/14/2018   Acute pancreatitis 02/20/2019   Anemia    Anxiety disorder 04/09/2015   Arthritis    Asthma    Atrial fibrillation (HCC) 11/21/2008   Qualifier: Diagnosis of   By: Waddell, MD, CODY Danelle Fallow        CAD (coronary artery disease)    a. S/P PCI mid LAD (vision stent) 08/31/2004;  b. repeat cath 6/11 - no progression of CAD. c. Cath 10/2012 given moderate CAD on cardiac CT - no change from prior cath.   Centrilobular emphysema (HCC) 10/08/2022   Chest CT 08/04/2022     Chest pain 12/16/2016   CHEST PAIN UNSPECIFIED 11/21/2008   Qualifier: Diagnosis of   By: Ardelia, EMT-P, Tara         CHF (congestive heart failure) (HCC), reduced LVEF 35 to 40% by TTE 11/19/2022, new decrease compared to prior echocardiogram 2022.  Likely tachycardia mediated in the setting of A-fib RVR 12/17/2022   Chronic respiratory failure with hypoxia (HCC) 02/24/2017   Chronic systolic CHF  (congestive heart failure) (HCC) 01/14/2023   Cirrhosis of liver without ascites (HCC) 04/14/2018   CKD (chronic kidney disease), stage III (HCC) 02/20/2019   COPD (chronic obstructive pulmonary disease) (HCC)    COPD without exacerbation (HCC) 05/15/2009   Qualifier: Diagnosis of   By: Jude MD, Harden GAILS.         COVID-19 virus infection    Cyst of nipple, right 11/03/2020   Degeneration of lumbar intervertebral disc 04/09/2015   Diverticulitis    Dyslipidemia    Essential hypertension 10/15/2008   Qualifier: Diagnosis of   By: Rolan Hough         Gallstones    GERD 10/15/2008   Qualifier: Diagnosis of   By: Rolan Hough         High risk medication use 04/09/2015   History of COVID-19 01/24/19 02/20/2019   History of kidney stones    History of pelvic fracture 01/01/2020   History of tattoo 08/23/2016   History of UTI 11/28/2019   Hypercoagulable state due to persistent atrial fibrillation (HCC) 03/26/2022   Hyperlipidemia 10/15/2008   Qualifier: Diagnosis of   By: Rolan Hough         Hypertension    Hyperthyroidism    HYPERTRIGLYCERIDEMIA 10/15/2008   Qualifier: Diagnosis of   By: Rolan Hough  Hypotension 06/13/2019   Idiopathic acute pancreatitis 04/14/2018   Incisional hernia, without obstruction or gangrene 02/05/2016   Infected pancreatic pseudocyst 06/07/2019   Insomnia 04/09/2015   Iron deficiency anemia 05/03/2022   Kidney mass 07/06/2017   Following with urology. Chao.  Recommend follow up. Last seen 01/2018  Refuses to see again.   CT okay 04/2021     Long term (current) use of anticoagulants [Z79.01] 06/10/2015   Melanoma (HCC)    melanoma removed from neck    Mild episode of recurrent major depressive disorder (HCC) 08/27/2015   NEPHROLITHIASIS 10/15/2008   Qualifier: Diagnosis of   By: Rolan Hough         OA (osteoarthritis) of hip 12/18/2014   On home oxygen therapy    2L q hs (11/08/2012),  06/04/19 pt. not on oxygen   OSA (obstructive  sleep apnea)    a. cpap noncompliance- patient reports on 12/10/14- does not use CPAP   Palpitations    a. 10/2012 - s/p LINQ loop recorder to assess for arrhythmia.    Pancreatic cyst 04/14/2018   Pancreatic pseudocyst 06/07/2019   Pancreatitis    Paroxysmal A-fib (HCC) 11/21/2008   a. S/P PVI 2/11 and 9/11   Paroxysmal atrial fibrillation (HCC) 01/14/2023   Pelvic fracture (HCC) 12/31/2019   Persistent atrial fibrillation (HCC) 06/08/2022   PICC (peripherally inserted central catheter) in place    Pneumonia    Protein-calorie malnutrition, severe 06/12/2019   Pseudocyst of pancreas 03/06/2019   Pure hypercholesterolemia    S/P coronary artery stent placement    SHORTNESS OF BREATH 03/19/2009   Qualifier: Diagnosis of   By: Kelsie MD, James         Sleep apnea 05/17/2009   Qualifier: Diagnosis of   By: Jude MD, Harden GAILS.         SVT (supraventricular tachycardia) (HCC)    a. Atypical AVNRT of the slow pathway   Thoracic aortic aneurysm (HCC) 11/13/2020   Type II diabetes mellitus (HCC)    Unstable angina (HCC) 10/15/2008   Qualifier: Diagnosis of   By: Rolan Hough          Patient Active Problem List   Diagnosis Date Noted   Hypotension 01/28/2023   Dehydration 01/14/2023   Chronic systolic CHF (congestive heart failure) (HCC) 01/14/2023   Paroxysmal atrial fibrillation (HCC) 01/14/2023   History of COPD 01/14/2023   Lactic acidosis 01/13/2023   CHF (congestive heart failure) (HCC), reduced LVEF 35 to 40% by TTE 11/19/2022, new decrease compared to prior echocardiogram 2022.  Likely tachycardia mediated in the setting of A-fib RVR 12/17/2022   Anemia    Arthritis    Asthma    COPD (chronic obstructive pulmonary disease) (HCC)    COVID-19 virus infection    Diverticulitis    Gallstones    History of kidney stones    Hyperthyroidism    Melanoma (HCC)    On home oxygen therapy    OSA (obstructive sleep apnea)    Pancreatitis    PICC (peripherally inserted central  catheter) in place    Pneumonia    SVT (supraventricular tachycardia) (HCC)    DM2 (diabetes mellitus, type 2) (HCC)    Centrilobular emphysema (HCC) 10/08/2022   Iron deficiency anemia 05/03/2022   Thoracic aortic aneurysm (HCC) 11/13/2020   Cyst of nipple, right 11/03/2020   History of pelvic fracture 01/01/2020   Pelvic fracture (HCC) 12/31/2019   History of UTI 11/28/2019   Protein-calorie malnutrition, severe 06/12/2019  Pancreatic pseudocyst 06/07/2019   Infected pancreatic pseudocyst 06/07/2019   Pseudocyst of pancreas 03/06/2019   Acute pancreatitis 02/20/2019   CKD stage 3a, GFR 45-59 ml/min (HCC) 02/20/2019   History of COVID-19 01/24/19 02/20/2019   Abnormal CT of the abdomen 05/27/2018   Recurrent pancreatitis 05/27/2018   Idiopathic acute pancreatitis 04/14/2018   Abnormal MRI of abdomen 04/14/2018   Pancreatic cyst 04/14/2018   Cirrhosis of liver without ascites (HCC) 04/14/2018   Kidney mass 07/06/2017   Chronic hypoxic respiratory failure (HCC) 02/24/2017   Chest pain 12/16/2016   S/P coronary artery stent placement    History of tattoo 08/23/2016   Incisional hernia, without obstruction or gangrene 02/05/2016   Mild episode of recurrent major depressive disorder (HCC) 08/27/2015   Long term (current) use of anticoagulants [Z79.01] 06/10/2015   High risk medication use 04/09/2015   Degeneration of lumbar intervertebral disc 04/09/2015   Anxiety disorder 04/09/2015   Insomnia 04/09/2015   OA (osteoarthritis) of hip 12/18/2014   CAD  s/p PCI of Mid LAD 08/2004, Cath 06/2009 unchagned, recent CTA 2022 with severe LCX disease vs artifact; followed by Stress nuclear test, no ischemia 10/2020 12/31/2009   Sleep apnea 05/17/2009   COPD without exacerbation (HCC) 05/15/2009   SHORTNESS OF BREATH 03/19/2009   CHEST PAIN UNSPECIFIED 11/21/2008   Permanent atrial fibrillation (HCC), s/p ablation 2011; failed rhythm control; persistent rapid rates, s/p AV nodal ablation  and BiV permanent pacemaker implant 12/27/2022 11/21/2008   Hyperlipidemia 10/15/2008   Essential hypertension 10/15/2008   GERD 10/15/2008   NEPHROLITHIASIS 10/15/2008   PALPITATIONS 10/15/2008   Dyslipidemia 10/15/2008    Past Surgical History:  Procedure Laterality Date   ATRIAL ABLATION SURGERY  2007   S/P slow pathway ablation for atypical AVNRT    AV NODE ABLATION N/A 12/27/2022   Procedure: AV NODE ABLATION;  Surgeon: Inocencio Soyla Lunger, MD;  Location: [facility] INVASIVE CV LAB;  Service: Cardiovascular;  Laterality: N/A;   BIOPSY  07/17/2018   Procedure: BIOPSY;  Surgeon: Wilhelmenia Aloha Raddle., MD;  Location: [facility] ENDOSCOPY;  Service: Gastroenterology;;   BIV PACEMAKER INSERTION CRT-P N/A 12/27/2022   Procedure: BIV PACEMAKER INSERTION CRT-P;  Surgeon: Inocencio Soyla Lunger, MD;  Location: [facility] INVASIVE CV LAB;  Service: Cardiovascular;  Laterality: N/A;   CARDIOVERSION N/A 04/13/2022   Procedure: CARDIOVERSION;  Surgeon: Mona Vinie BROCKS, MD;  Location: [facility] ENDOSCOPY;  Service: Cardiovascular;  Laterality: N/A;   CARDIOVERSION N/A 06/10/2022   Procedure: CARDIOVERSION;  Surgeon: Delford Maude BROCKS, MD;  Location: [facility] INVASIVE CV LAB;  Service: Cardiovascular;  Laterality: N/A;   CHOLECYSTECTOMY  ~ 2008   COLON RESECTION  02/2013    COLONOSCOPY  12/25/2007   Colonic polyp status post polypectomy. Internal hemorrhoids.    CORONARY ANGIOPLASTY WITH STENT PLACEMENT  08/30/2004   Vision; to LAD    CYSTOSCOPY W/ STONE MANIPULATION  ~ 1998   once (11/08/2012)   EP Study  2008   Negative EP study in [place]   ESOPHAGOGASTRODUODENOSCOPY (EGD) WITH PROPOFOL  N/A 07/17/2018   Procedure: ESOPHAGOGASTRODUODENOSCOPY (EGD) WITH PROPOFOL ;  Surgeon: Wilhelmenia Aloha Raddle., MD;  Location: [facility] ENDOSCOPY;  Service: Gastroenterology;  Laterality: N/A;   FRACTURE SURGERY     LEFT HEART CATHETERIZATION WITH CORONARY ANGIOGRAM N/A 11/10/2012   Procedure: LEFT HEART CATHETERIZATION WITH CORONARY ANGIOGRAM;   Surgeon: Lonni JONETTA Cash, MD;  Location: [facility] CATH LAB;  Service: Cardiovascular;  Laterality: N/A;   LOOP RECORDER IMPLANT N/A 11/10/2012   Medtronic LinQ implanted by Dr Kelsie  for afib management   LUMBAR DISC SURGERY  2000; 2002   POSTERIOR LUMBAR FUSION  2004   PROSTATE SURGERY  ~ 2009   PVI/ CTI ablation  02/25/2009 and 09/2009   S/P afib/ CTI ablation   SPLENECTOMY, TOTAL N/A 06/07/2019   Procedure: SPLENECTOMY;  Surgeon: Aron Shoulders, MD;  Location: [facility] OR;  Service: General;  Laterality: N/A;   TIBIA FRACTURE SURGERY Right 1990   broke in 3 places; had rod put in (11/08/2012)   TIBIA HARDWARE REMOVAL Right ~ 1991   TONSILLECTOMY  1965   TOTAL HIP ARTHROPLASTY Right 12/18/2014   Procedure: RIGHT TOTAL HIP ARTHROPLASTY ANTERIOR APPROACH;  Surgeon: Dempsey Moan, MD;  Location: [facility] ORS;  Service: Orthopedics;  Laterality: Right;   UPPER ESOPHAGEAL ENDOSCOPIC ULTRASOUND (EUS) N/A 07/17/2018   Procedure: UPPER ESOPHAGEAL ENDOSCOPIC ULTRASOUND (EUS);  Surgeon: Wilhelmenia Aloha Raddle., MD;  Location: [facility] ENDOSCOPY;  Service: Gastroenterology;  Laterality: N/A;       Home Medications    Prior to Admission medications   Medication Sig Start Date End Date Taking? Authorizing Provider  albuterol  (VENTOLIN  HFA) 108 (90 Base) MCG/ACT inhaler Inhale 2 puffs into the lungs every 6 (six) hours as needed for wheezing or shortness of breath.    [provider]  apixaban  (ELIQUIS ) 5 MG TABS tablet Take 1 tablet (5 mg total) by mouth 2 (two) times daily. 12/30/22   Riddle, Suzann, NP  baclofen  (LIORESAL ) 10 MG tablet Take 10 mg by mouth every 8 (eight) hours.    [provider]  Budeson-Glycopyrrol-Formoterol  (BREZTRI AEROSPHERE) 160-9-4.8 MCG/ACT AERO Inhale 2 puffs into the lungs 2 (two) times daily as needed (wheezing, shortness of breath).    [provider]  cyanocobalamin (VITAMIN B12) 1000 MCG/ML injection Inject 1,000 mcg into the muscle every 30 (thirty) days.     [provider]  docusate sodium  (COLACE) 100 MG capsule Take 100 mg by mouth 2 (two) times daily as needed for moderate constipation.    [provider]  empagliflozin  (JARDIANCE ) 25 MG TABS tablet Take 25 mg by mouth daily. Patient not taking: Reported on 01/28/2023    [provider]  escitalopram  (LEXAPRO ) 10 MG tablet Take 10 mg by mouth daily. 08/06/22 08/06/23  [provider]  fenofibrate  160 MG tablet Take 160 mg by mouth daily.    [provider]  ferrous sulfate  325 (65 FE) MG tablet Take 325 mg by mouth daily with breakfast.    [provider]  furosemide  (LASIX ) 20 MG tablet Take 40 mg by mouth daily as needed for fluid or edema (as needed for 2 pound weight gain in 1 day or 4 pound weight gain in one week.). 09/09/21   [provider]  gabapentin  (NEURONTIN ) 300 MG capsule Take 1 capsule (300 mg total) by mouth 3 (three) times daily. 01/07/20   Pahwani, Velna SAUNDERS, MD  magnesium  oxide (MAG-OX) 400 MG tablet Take 1 tablet (400 mg total) by mouth daily. 06/12/22   Barrett, Shona MATSU, PA-C  Melatonin 12 MG TABS Take 24 mg by mouth at bedtime.    [provider]  metFORMIN  (GLUCOPHAGE ) 1000 MG tablet Take 1,000 mg by mouth 2 (two) times daily. Patient not taking: Reported on 01/28/2023 01/24/23   [provider]  metoprolol  succinate (TOPROL -XL) 25 MG 24 hr tablet Take 25 mg by mouth daily. Patient not taking: Reported on 01/28/2023    [provider]  ondansetron  (ZOFRAN ) 4 MG tablet Take 4 mg by mouth  daily as needed for nausea or vomiting.    [provider]  Oxycodone  HCl 10 MG TABS Take 1 tablet by mouth daily as needed (For pain). Take 1 tablet every 4-6 hours as needed 04/09/22   [provider]  pantoprazole  (PROTONIX ) 40 MG tablet Take 1 tablet (40 mg total) by mouth 2 (two) times daily. 11/29/22   Charlanne Groom, MD  Tamsulosin  HCl (FLOMAX ) 0.4 MG CAPS Take 0.4 mg by mouth daily.     [provider]  zolpidem  (AMBIEN ) 5 MG tablet Take 0.5 tablets (2.5 mg total) by mouth at bedtime as needed for sleep. Patient taking differently: Take 5 mg by mouth at bedtime. 05/06/22   Thurmond Cathlyn LABOR., MD    Family History Family History  Problem Relation Age of Onset   Emphysema Mother    Heart attack Father        CVA   Hypertension Father    Diabetes Father    Heart disease Father        MI   Emphysema Maternal Grandfather        Smoker   Heart disease Paternal Grandfather    Colon cancer Neg Hx    Esophageal cancer Neg Hx    Inflammatory bowel disease Neg Hx    Liver disease Neg Hx    Pancreatic cancer Neg Hx    Rectal cancer Neg Hx    Stomach cancer Neg Hx     Social History Social History   Tobacco Use   Smoking status: Former    Current packs/day: 0.00    Average packs/day: 3.0 packs/day for 38.0 years (114.0 ttl pk-yrs)    Types: Cigarettes    Start date: 03/26/1966    Quit date: 03/25/2004    Years since quitting: 18.8   Smokeless tobacco: Former    Types: Chew    Quit date: 04/25/2004   Tobacco comments:    Former smoker 03/26/22  Vaping Use   Vaping status: Never Used  Substance Use Topics   Alcohol  use: Not Currently    Comment:  quit drinking in 1989   Drug use: No     Allergies   Penicillins and Levaquin  [levofloxacin ]   Review of Systems Review of Systems  Respiratory:  Positive for cough.   Per HPI   Physical Exam Triage Vital Signs ED Triage Vitals  Encounter Vitals Group     BP 01/28/23 1136 (!) 61/43     Systolic BP Percentile --      Diastolic BP Percentile --      Pulse Rate 01/28/23 1136 73     Resp --      Temp 01/28/23 1136 99.9 F (37.7 C)     Temp Source 01/28/23 1136 Oral     SpO2 01/28/23 1136 90 %     Weight --      Height --      Head Circumference --      Peak Flow --      Pain Score 01/28/23 1138 7     Pain Loc --      Pain Education --      Exclude from Growth Chart --    No data  found.  Updated Vital Signs BP (!) 61/43 (BP Location: Left Arm)   Pulse 73   Temp 99.9 F (37.7 C) (Oral)   SpO2 (!) 89%   Visual Acuity Right Eye Distance:   Left Eye Distance:   Bilateral Distance:    Right  Eye Near:   Left Eye Near:    Bilateral Near:     Physical Exam Vitals and nursing note reviewed.  Constitutional:      Appearance: He is ill-appearing and toxic-appearing.  HENT:     Head: Normocephalic and atraumatic.     Right Ear: Hearing and external ear normal.     Left Ear: Hearing and external ear normal.     Nose: Nose normal.     Mouth/Throat:     Lips: Pink.  Eyes:     General: Lids are normal. Vision grossly intact. Gaze aligned appropriately.     Extraocular Movements: Extraocular movements intact.     Conjunctiva/sclera: Conjunctivae normal.  Pulmonary:     Effort: Pulmonary effort is normal.     Breath sounds: Decreased breath sounds present.  Musculoskeletal:     Cervical back: Neck supple.  Skin:    General: Skin is warm and dry.     Capillary Refill: Capillary refill takes less than 2 seconds.     Coloration: Skin is ashen and pale.     Findings: No rash.  Neurological:     General: No focal deficit present.     Mental Status: He is oriented to person, place, and time. Mental status is at baseline.     GCS: GCS eye subscore is 4. GCS verbal subscore is 5. GCS motor subscore is 6.     Cranial Nerves: Cranial nerves 2-12 are intact. No dysarthria or facial asymmetry.  Psychiatric:        Mood and Affect: Mood normal.        Speech: Speech normal.        Behavior: Behavior normal.        Thought Content: Thought content normal.        Judgment: Judgment normal.      [facility] Treatments / Results  Labs (all labs ordered are listed, but only abnormal results are displayed) Labs Reviewed  POCT FASTING CBG [facility] MANUAL ENTRY - Abnormal; Notable for the following components:      Result Value   POCT Glucose ([facility]) 61 (*)    All other components  within normal limits    EKG   Radiology ECHOCARDIOGRAM COMPLETE Result Date: 01/28/2023    ECHOCARDIOGRAM REPORT   Patient Name:   SIDDH VANDEVENTER Date of Exam: 01/28/2023 Medical Rec #:  [id number]      Height:       72.0 in Accession #:    [id number]     Weight:       192.4 lb Date of Birth:  [birth year]      BSA:          2.096 m Patient Age:    72 years       BP:           102/67 mmHg Patient Gender: M              HR:           78 bpm. Exam Location:  Inpatient Procedure: 2D Echo, Color Doppler and Cardiac Doppler STAT ECHO Indications:    acute systolic chf  History:        Patient has prior history of Echocardiogram examinations, most                 recent 11/19/2022. CHF, CAD, Pacemaker, COPD and chronic kidney                 disease, Arrythmias:Atrial  Fibrillation; Risk Factors:Former                 Smoker, Sleep Apnea, Hypertension and Dyslipidemia.  Sonographer:    Tinnie Barefoot RDCS Referring Phys: [id number] RAVI AGARWALA IMPRESSIONS  1. Left ventricular ejection fraction, by estimation, is 60 to 65%. The left ventricle has normal function. The left ventricle has no regional wall motion abnormalities. Left ventricular diastolic parameters are indeterminate.  2. Right ventricular systolic function is moderately reduced. The right ventricular size is moderately enlarged. There is severely elevated pulmonary artery systolic pressure. The estimated right ventricular systolic pressure is 60.7 mmHg.  3. Left atrial size was moderately dilated.  4. Right atrial size was moderately dilated.  5. The mitral valve is normal in structure. Trivial mitral valve regurgitation. No evidence of mitral stenosis.  6. The aortic valve is tricuspid. Aortic valve regurgitation is not visualized. No aortic stenosis is present.  7. Aortic dilatation noted. There is mild dilatation of the ascending aorta, measuring 40 mm.  8. The inferior vena cava is dilated in size with <50% respiratory variability, suggesting right  atrial pressure of 15 mmHg. FINDINGS  Left Ventricle: Left ventricular ejection fraction, by estimation, is 60 to 65%. The left ventricle has normal function. The left ventricle has no regional wall motion abnormalities. The left ventricular internal cavity size was normal in size. There is  no left ventricular hypertrophy. Left ventricular diastolic parameters are indeterminate. Right Ventricle: The right ventricular size is moderately enlarged. No increase in right ventricular wall thickness. Right ventricular systolic function is moderately reduced. There is severely elevated pulmonary artery systolic pressure. The tricuspid regurgitant velocity is 3.38 m/s, and with an assumed right atrial pressure of 15 mmHg, the estimated right ventricular systolic pressure is 60.7 mmHg. Left Atrium: Left atrial size was moderately dilated. Right Atrium: Right atrial size was moderately dilated. Pericardium: There is no evidence of pericardial effusion. Mitral Valve: The mitral valve is normal in structure. There is mild calcification of the mitral valve leaflet(s). Mild mitral annular calcification. Trivial mitral valve regurgitation. No evidence of mitral valve stenosis. Tricuspid Valve: The tricuspid valve is normal in structure. Tricuspid valve regurgitation is mild. Aortic Valve: The aortic valve is tricuspid. Aortic valve regurgitation is not visualized. No aortic stenosis is present. Pulmonic Valve: The pulmonic valve was normal in structure. Pulmonic valve regurgitation is not visualized. Aorta: Aortic dilatation noted. There is mild dilatation of the ascending aorta, measuring 40 mm. Venous: The inferior vena cava is dilated in size with less than 50% respiratory variability, suggesting right atrial pressure of 15 mmHg. IAS/Shunts: No atrial level shunt detected by color flow Doppler. Additional Comments: A device lead is visualized in the right ventricle.  LEFT VENTRICLE PLAX 2D LVIDd:         5.50 cm   Diastology  LVIDs:         3.10 cm   LV e' medial:    12.40 cm/s LV PW:         0.80 cm   LV E/e' medial:  8.8 LV IVS:        1.10 cm   LV e' lateral:   17.30 cm/s LVOT diam:     2.20 cm   LV E/e' lateral: 6.3 LV SV:         72 LV SV Index:   34 LVOT Area:     3.80 cm  RIGHT VENTRICLE             IVC  RV Basal diam:  3.40 cm     IVC diam: 2.60 cm RV S prime:     19.90 cm/s TAPSE (M-mode): 1.4 cm LEFT ATRIUM             Index        RIGHT ATRIUM           Index LA Vol (A2C):   97.6 ml 46.57 ml/m  RA Area:     28.80 cm LA Vol (A4C):   96.3 ml 45.95 ml/m  RA Volume:   97.00 ml  46.28 ml/m LA Biplane Vol: 97.7 ml 46.62 ml/m  AORTIC VALVE LVOT Vmax:   119.00 cm/s LVOT Vmean:  80.900 cm/s LVOT VTI:    0.190 m  AORTA Ao Root diam: 4.00 cm Ao Asc diam:  4.00 cm MITRAL VALVE                TRICUSPID VALVE MV Area (PHT): 3.65 cm     TR Peak grad:   45.7 mmHg MV Decel Time: 208 msec     TR Vmax:        338.00 cm/s MV E velocity: 109.00 cm/s MV A velocity: 28.70 cm/s   SHUNTS MV E/A ratio:  3.80         Systemic VTI:  0.19 m                             Systemic Diam: 2.20 cm Dalton McleanMD Electronically signed by Ezra Kanner Signature Date/Time: 01/28/2023/6:12:35 PM    Final    CT CHEST ABDOMEN PELVIS W CONTRAST Result Date: 01/28/2023 CLINICAL DATA:  Weakness over the last 2 weeks with fall a few days ago. Head injury. Poly trauma blunt. EXAM: CT CHEST, ABDOMEN, AND PELVIS WITH CONTRAST TECHNIQUE: Multidetector CT imaging of the chest, abdomen and pelvis was performed following the standard protocol during bolus administration of intravenous contrast. RADIATION DOSE REDUCTION: This exam was performed according to the departmental dose-optimization program which includes automated exposure control, adjustment of the mA and/or kV according to patient size and/or use of iterative reconstruction technique. CONTRAST:  60mL OMNIPAQUE  IOHEXOL  350 MG/ML SOLN COMPARISON:  Prior CTs of the chest, abdomen and pelvis 01/13/2023.  FINDINGS: CT CHEST FINDINGS Cardiovascular: No acute vascular findings are seen. Left subclavian pacemaker leads extend into the right atrium, right ventricle and coronary sinus. There is atherosclerosis of the aorta, great vessels and coronary arteries. The heart size is normal. There is no pericardial effusion. Mediastinum/Nodes: There are no enlarged mediastinal, hilar or axillary lymph nodes. Small mediastinal lymph nodes appear unchanged. The thyroid gland, trachea and esophagus demonstrate no significant findings. Lungs/Pleura: Small bilateral pleural effusions have mildly increased since the previous CT. There is no evidence of pneumothorax. There is increased central airway thickening and dependent airspace opacities in both lungs which could reflect atelectasis or early pneumonia. Underlying mild to moderate centrilobular emphysema noted. Musculoskeletal/Chest wall: No chest wall mass or suspicious osseous findings. No evidence of acute fracture. Grossly stable mild superior endplate compression deformities at T2 and T7. Multilevel spondylosis. CT ABDOMEN AND PELVIS FINDINGS Hepatobiliary: Decreased density throughout the liver consistent with steatosis. No focal lesion or abnormal enhancement identified. No evidence of significant biliary dilatation status post cholecystectomy. Pancreas: Chronic calcifications throughout the pancreatic head and proximal body consistent with chronic calcific pancreatitis. There is marked atrophy of the pancreatic body and tail. Spleen: Status post splenectomy. Adrenals/Urinary Tract: Both adrenal glands appear  normal. No evidence of urinary tract calculus, suspicious renal lesion or hydronephrosis. Unchanged small renal cysts bilaterally for which no specific follow-up imaging is recommended. The bladder appears unremarkable for its degree of distention, although is partially obscured by artifact from the right hip arthroplasty. Stomach/Bowel: No enteric contrast  administered. The stomach appears unremarkable for its degree of distention. No small bowel distension, wall thickening or surrounding inflammation. The appendix appears normal. Possible new wall thickening involving the cecum and proximal transverse colon. There are postsurgical changes consistent with previous partial left colectomy. Prominent stool throughout the colon. No evidence of bowel obstruction. Vascular/Lymphatic: There are no enlarged abdominal or pelvic lymph nodes. Aortic and branch vessel atherosclerosis without evidence of aneurysm or large vessel occlusion. Reproductive: The prostate gland and seminal vesicles appear unremarkable, although remain partially obscured by artifact from the right total hip arthroplasty. Other: No evidence of abdominal wall mass or hernia. No ascites or pneumoperitoneum. Mild generalized subcutaneous edema has developed. Musculoskeletal: No acute or significant osseous findings. Previous right total hip arthroplasty and lower lumbar fusion. Multilevel spondylosis and old pubic rami fractures bilaterally. IMPRESSION: 1. No evidence of acute traumatic injury within the chest, abdomen or pelvis. 2. Increased central airway thickening and dependent airspace opacities in both lungs which could reflect atelectasis or early pneumonia. Small bilateral pleural effusions have mildly increased since the previous CT. 3. Possible new wall thickening involving the cecum and proximal transverse colon, possibly colitis. Correlate clinically. No evidence of bowel obstruction. 4. Chronic calcific pancreatitis. 5. Hepatic steatosis. 6. Aortic Atherosclerosis (ICD10-I70.0) and Emphysema (ICD10-J43.9). Electronically Signed   By: Elsie Perone M.D.   On: 01/28/2023 17:53   CT Head Wo Contrast Result Date: 01/28/2023 CLINICAL DATA:  Ataxia head injury.  Trauma. EXAM: CT HEAD WITHOUT CONTRAST TECHNIQUE: Contiguous axial images were obtained from the base of the skull through the vertex  without intravenous contrast. RADIATION DOSE REDUCTION: This exam was performed according to the departmental dose-optimization program which includes automated exposure control, adjustment of the mA and/or kV according to patient size and/or use of iterative reconstruction technique. COMPARISON:  12/31/2019 FINDINGS: Brain: No evidence of acute infarction, hemorrhage, hydrocephalus, extra-axial collection or mass lesion/mass effect. Vascular: No hyperdense vessel or unexpected calcification. Skull: Normal. Negative for fracture or focal lesion. Sinuses/Orbits: Mastoid air cells are clear. Mucosal thickening is noted involving the right maxillary sinus. No sinus fluid levels. Other: None. IMPRESSION: 1. No acute intracranial abnormalities. 2. Right maxillary sinus disease. Electronically Signed   By: Waddell Calk M.D.   On: 01/28/2023 17:40   DG Chest Port 1 View Result Date: 01/28/2023 CLINICAL DATA:  Questionable sepsis - evaluate for abnormality. EXAM: PORTABLE CHEST 1 VIEW COMPARISON:  01/25/2023. FINDINGS: There is mild pulmonary vascular congestion. There is left retrocardiac opacity partially obscuring the left medial hemidiaphragm and descending thoracic aorta, favoring combination of left lung atelectasis and/or consolidation. Bilateral lung fields are otherwise clear. No acute consolidation or lung collapse. Bilateral costophrenic angles are clear. Stable cardio-mediastinal silhouette. There is a left sided 2-lead pacemaker. No acute osseous abnormalities. The soft tissues are within normal limits. IMPRESSION: *Mild pulmonary vascular congestion.  Left basilar opacity. Electronically Signed   By: Ree Molt M.D.   On: 01/28/2023 15:11    Procedures Procedures (including critical care time)  Medications Ordered in UC Medications - No data to display  Initial Impression / Assessment and Plan / UC Course  I have reviewed the triage vital signs and the nursing notes.  Pertinent  labs &  imaging results that were available during my care of the patient were reviewed by me and considered in my medical decision making (see chart for details).   1. Hypotension, dizziness High clinical suspicion for septic shock of unknown cause. Discussed concerns with wife at bedside who is agreeable with ambulance transport to ER.  911 called, RN placed 18 gauge IV to left forearm, patient placed in supine position.  Manual BP 58/30. He is lethargic appearing, though oriented x4 and responsive.  CBG 62.  Discharged from urgent care with EMS in stable condition for transport to ER.   Final Clinical Impressions(s) / UC Diagnoses   Final diagnoses:  Hypotension, unspecified hypotension type  Dizziness   Discharge Instructions   None    ED Prescriptions   None    PDMP not reviewed this encounter.   Enedelia Dorna HERO, [place] 01/28/23 1951

## 2023-01-28 NOTE — ED Notes (Signed)
Report given to icu

## 2023-01-28 NOTE — Consult Note (Addendum)
 Advanced Heart Failure Team Consult Note   Primary Physician: Thurmond Cathlyn LABOR., MD Cardiologist:  Liborio Asper, MD  Reason for Consultation: Cardiogenic Shock  HPI:    Steve Anthony is seen today for evaluation of cardiogenic shock at the request of Dr. Arlinda.   Steve Anthony is a 73 y.o. male with past medical history of HFrEF, CAD s/p PCI to LAD in 2006, permanent A-fib on Eliquis , sick sinus syndrome s/p biV pacemaker in 12/2022, COPD, CKDIII.  Long history of ICM and HFpEF, has followed with Dr. Liborio. Had recent new reduction in EF to 35-40% in 10/24 and was thought to be tachy-mediated related to persistent atrial fibrillation. He had previously failled amiodarone  due to side effects and failed Tikosyn  due to poor rate control. He was referred to EP for ablation.  Underwent AV nodal ablation and BiV pacemaker implant 12/27/22 by Dr. Inocencio.  In EP follow up 12/19 noted to by hypotensive and sent to ED. Is lactate elevated to 4.5>8.5. CT abd notable for acute on chorninc pancreatitis and was treated with antibiotics. It was thought to be due to starvation ketosis and lactic acidosis, labs notable for euglycemic ketoacidosis, his metformin  and jardiance  were discontinued. He was discharged next day at his request to tend to terminally ill mother, who has since passed.   Seen in by his cardiologist 11/21/22 and was noted to by hypotensive SBP in 80s in the setting of volume overload. At this time it was thought that his hypotension was related to worsening LV function with preshock and was instructed to go to the ED.   Presented to the ED today for weakness. Vitals in ED: BP 80/56 (63), HR 73, RR 15. Labs notable for K 4.2, CO2 12, BUN/Cr 8/1.84, Ca 7.6, Tbili 1.7, AST/ALT 33/15, LA 8.8>9.5, HGB/HCT 8.6/25.5, WBC 23, INR 3.5. CXR w pulmonary edema. CT head, C/A/P pending. In ED, given concern for septic and cardiogenic shock blood cultures were obtained. He was given 2L LR  and started on vanc/zosyn . He was admitted to ICU for further care and Advanced Heart Failure consulted.  Home Medications Prior to Admission medications   Medication Sig Start Date End Date Taking? Authorizing Provider  metFORMIN  (GLUCOPHAGE ) 1000 MG tablet Take 1,000 mg by mouth 2 (two) times daily. 01/24/23  Yes [provider]  apixaban  (ELIQUIS ) 5 MG TABS tablet Take 1 tablet (5 mg total) by mouth 2 (two) times daily. 12/30/22   Riddle, Suzann, NP  baclofen  (LIORESAL ) 10 MG tablet Take 10 mg by mouth every 8 (eight) hours as needed for muscle spasms.    [provider]  escitalopram  (LEXAPRO ) 10 MG tablet Take 10 mg by mouth daily. 08/06/22 08/06/23  [provider]  ferrous sulfate  325 (65 FE) MG tablet Take 325 mg by mouth daily with breakfast.    [provider]  furosemide  (LASIX ) 20 MG tablet Take 40 mg by mouth daily as needed for fluid or edema (as needed for 2 pound weight gain in 1 day or 4 pound weight gain in one week.). 09/09/21   [provider]  gabapentin  (NEURONTIN ) 300 MG capsule Take 1 capsule (300 mg total) by mouth 3 (three) times daily. 01/07/20   Pahwani, Velna JONELLE, MD  Insulin  Pen Needle 29G X MISC 1 Device by Does not apply route 3 (three) times daily as needed (for use with insulin  pen). 06/18/19   Cindy Garnette POUR, MD  magnesium  oxide (MAG-OX) 400 MG tablet  Take 1 tablet (400 mg total) by mouth daily. 06/12/22   Barrett, Shona MATSU, PA-C  Melatonin 10 MG CAPS Take 20 mg by mouth at bedtime.    [provider]  Multiple Vitamins-Minerals (PRESERVISION AREDS) TABS Take 1 tablet by mouth 2 (two) times daily.    [provider]  nitroGLYCERIN  (NITROSTAT ) 0.4 MG SL tablet Place 0.4 mg under the tongue every 5 (five) minutes as needed for chest pain.    [provider]  Oxycodone  HCl 10 MG TABS Take 1 tablet by mouth daily as needed (For pain). Take 1 tablet every 4-6 hours as needed 04/09/22   [provider]  pantoprazole  (PROTONIX ) 40 MG tablet Take 1 tablet (40 mg total) by mouth 2 (two) times daily. 11/29/22   Charlanne Groom, MD  Tamsulosin  HCl (FLOMAX ) 0.4 MG CAPS Take 0.4 mg by mouth daily.    [provider]  zolpidem  (AMBIEN ) 5 MG tablet Take 0.5 tablets (2.5 mg total) by mouth at bedtime as needed for sleep. 05/06/22   Thurmond Cathlyn LABOR., MD    Past Medical History: Past Medical History:  Diagnosis Date   Abnormal CT of the abdomen 05/27/2018   Abnormal MRI of abdomen 04/14/2018   Acute pancreatitis 02/20/2019   Anemia    Anxiety disorder 04/09/2015   Arthritis    Asthma    Atrial fibrillation (HCC) 11/21/2008   Qualifier: Diagnosis of   By: Waddell, MD, CODY Danelle Fallow        CAD (coronary artery disease)    a. S/P PCI mid LAD (vision stent) 08/31/2004;  b. repeat cath 6/11 - no progression of CAD. c. Cath 10/2012 given moderate CAD on cardiac CT - no change from prior cath.   Centrilobular emphysema (HCC) 10/08/2022   Chest CT 08/04/2022     Chest pain 12/16/2016   CHEST PAIN UNSPECIFIED 11/21/2008   Qualifier: Diagnosis of   By: Ardelia, EMT-P, Tara         CHF (congestive heart failure) (HCC), reduced LVEF 35 to 40% by TTE 11/19/2022, new decrease compared to prior echocardiogram 2022.  Likely tachycardia mediated in the setting of A-fib RVR 12/17/2022   Chronic respiratory failure with hypoxia (HCC) 02/24/2017   Chronic systolic CHF (congestive heart failure) (HCC) 01/14/2023   Cirrhosis of liver without ascites (HCC) 04/14/2018   CKD (chronic kidney disease), stage III (HCC) 02/20/2019   COPD (chronic obstructive pulmonary disease) (HCC)    COPD without exacerbation (HCC) 05/15/2009   Qualifier: Diagnosis of   By: Jude MD, Harden GAILS.         COVID-19 virus infection    Cyst of nipple, right 11/03/2020   Degeneration of lumbar intervertebral disc 04/09/2015   Diverticulitis    Dyslipidemia    Essential hypertension 10/15/2008   Qualifier: Diagnosis of    By: Rolan Hough         Gallstones    GERD 10/15/2008   Qualifier: Diagnosis of   By: Rolan Hough         High risk medication use 04/09/2015   History of COVID-19 01/24/19 02/20/2019   History of kidney stones    History of pelvic fracture 01/01/2020   History of tattoo 08/23/2016   History of UTI 11/28/2019   Hypercoagulable state due to persistent atrial fibrillation (HCC) 03/26/2022   Hyperlipidemia 10/15/2008   Qualifier: Diagnosis of   By: Rolan Hough         Hypertension    Hyperthyroidism  HYPERTRIGLYCERIDEMIA 10/15/2008   Qualifier: Diagnosis of   By: Rolan Hough         Hypotension 06/13/2019   Idiopathic acute pancreatitis 04/14/2018   Incisional hernia, without obstruction or gangrene 02/05/2016   Infected pancreatic pseudocyst 06/07/2019   Insomnia 04/09/2015   Iron deficiency anemia 05/03/2022   Kidney mass 07/06/2017   Following with urology. Chao.  Recommend follow up. Last seen 01/2018  Refuses to see again.   CT okay 04/2021     Long term (current) use of anticoagulants [Z79.01] 06/10/2015   Melanoma (HCC)    melanoma removed from neck    Mild episode of recurrent major depressive disorder (HCC) 08/27/2015   NEPHROLITHIASIS 10/15/2008   Qualifier: Diagnosis of   By: Rolan Hough         OA (osteoarthritis) of hip 12/18/2014   On home oxygen therapy    2L q hs (11/08/2012),  06/04/19 pt. not on oxygen   OSA (obstructive sleep apnea)    a. cpap noncompliance- patient reports on 12/10/14- does not use CPAP   Palpitations    a. 10/2012 - s/p LINQ loop recorder to assess for arrhythmia.    Pancreatic cyst 04/14/2018   Pancreatic pseudocyst 06/07/2019   Pancreatitis    Paroxysmal A-fib (HCC) 11/21/2008   a. S/P PVI 2/11 and 9/11   Paroxysmal atrial fibrillation (HCC) 01/14/2023   Pelvic fracture (HCC) 12/31/2019   Persistent atrial fibrillation (HCC) 06/08/2022   PICC (peripherally inserted central catheter) in place    Pneumonia     Protein-calorie malnutrition, severe 06/12/2019   Pseudocyst of pancreas 03/06/2019   Pure hypercholesterolemia    S/P coronary artery stent placement    SHORTNESS OF BREATH 03/19/2009   Qualifier: Diagnosis of   By: Kelsie MD, James         Sleep apnea 05/17/2009   Qualifier: Diagnosis of   By: Jude MD, Harden GAILS.         SVT (supraventricular tachycardia) (HCC)    a. Atypical AVNRT of the slow pathway   Thoracic aortic aneurysm (HCC) 11/13/2020   Type II diabetes mellitus (HCC)    Unstable angina (HCC) 10/15/2008   Qualifier: Diagnosis of   By: Rolan Hough          Past Surgical History: Past Surgical History:  Procedure Laterality Date   ATRIAL ABLATION SURGERY  2007   S/P slow pathway ablation for atypical AVNRT    AV NODE ABLATION N/A 12/27/2022   Procedure: AV NODE ABLATION;  Surgeon: Inocencio Soyla Lunger, MD;  Location: [facility] INVASIVE CV LAB;  Service: Cardiovascular;  Laterality: N/A;   BIOPSY  07/17/2018   Procedure: BIOPSY;  Surgeon: Wilhelmenia Aloha Raddle., MD;  Location: [facility] ENDOSCOPY;  Service: Gastroenterology;;   BIV PACEMAKER INSERTION CRT-P N/A 12/27/2022   Procedure: BIV PACEMAKER INSERTION CRT-P;  Surgeon: Inocencio Soyla Lunger, MD;  Location: [facility] INVASIVE CV LAB;  Service: Cardiovascular;  Laterality: N/A;   CARDIOVERSION N/A 04/13/2022   Procedure: CARDIOVERSION;  Surgeon: Mona Vinie BROCKS, MD;  Location: [facility] ENDOSCOPY;  Service: Cardiovascular;  Laterality: N/A;   CARDIOVERSION N/A 06/10/2022   Procedure: CARDIOVERSION;  Surgeon: Delford Maude BROCKS, MD;  Location: [facility] INVASIVE CV LAB;  Service: Cardiovascular;  Laterality: N/A;   CHOLECYSTECTOMY  ~ 2008   COLON RESECTION  02/2013    COLONOSCOPY  12/25/2007   Colonic polyp status post polypectomy. Internal hemorrhoids.    CORONARY ANGIOPLASTY WITH STENT PLACEMENT  08/30/2004   Vision; to LAD  CYSTOSCOPY W/ STONE MANIPULATION  ~ 1998   once (11/08/2012)   EP Study  2008   Negative EP study in [place]    ESOPHAGOGASTRODUODENOSCOPY (EGD) WITH PROPOFOL  N/A 07/17/2018   Procedure: ESOPHAGOGASTRODUODENOSCOPY (EGD) WITH PROPOFOL ;  Surgeon: Wilhelmenia Aloha Raddle., MD;  Location: [facility] ENDOSCOPY;  Service: Gastroenterology;  Laterality: N/A;   FRACTURE SURGERY     LEFT HEART CATHETERIZATION WITH CORONARY ANGIOGRAM N/A 11/10/2012   Procedure: LEFT HEART CATHETERIZATION WITH CORONARY ANGIOGRAM;  Surgeon: Lonni JONETTA Cash, MD;  Location: [facility] CATH LAB;  Service: Cardiovascular;  Laterality: N/A;   LOOP RECORDER IMPLANT N/A 11/10/2012   Medtronic LinQ implanted by Dr Kelsie for afib management   LUMBAR DISC SURGERY  2000; 2002   POSTERIOR LUMBAR FUSION  2004   PROSTATE SURGERY  ~ 2009   PVI/ CTI ablation  02/25/2009 and 09/2009   S/P afib/ CTI ablation   SPLENECTOMY, TOTAL N/A 06/07/2019   Procedure: SPLENECTOMY;  Surgeon: Aron Shoulders, MD;  Location: [facility] OR;  Service: General;  Laterality: N/A;   TIBIA FRACTURE SURGERY Right 1990   broke in 3 places; had rod put in (11/08/2012)   TIBIA HARDWARE REMOVAL Right ~ 1991   TONSILLECTOMY  1965   TOTAL HIP ARTHROPLASTY Right 12/18/2014   Procedure: RIGHT TOTAL HIP ARTHROPLASTY ANTERIOR APPROACH;  Surgeon: Dempsey Moan, MD;  Location: [facility] ORS;  Service: Orthopedics;  Laterality: Right;   UPPER ESOPHAGEAL ENDOSCOPIC ULTRASOUND (EUS) N/A 07/17/2018   Procedure: UPPER ESOPHAGEAL ENDOSCOPIC ULTRASOUND (EUS);  Surgeon: Wilhelmenia Aloha Raddle., MD;  Location: [facility] ENDOSCOPY;  Service: Gastroenterology;  Laterality: N/A;    Family History: Family History  Problem Relation Age of Onset   Emphysema Mother    Heart attack Father        CVA   Hypertension Father    Diabetes Father    Heart disease Father        MI   Emphysema Maternal Grandfather        Smoker   Heart disease Paternal Grandfather    Colon cancer Neg Hx    Esophageal cancer Neg Hx    Inflammatory bowel disease Neg Hx    Liver disease Neg Hx    Pancreatic cancer Neg Hx    Rectal cancer Neg Hx     Stomach cancer Neg Hx     Social History: Social History   Socioeconomic History   Marital status: Married    Spouse name: Not on file   Number of children: 1   Years of education: Not on file   Highest education level: Not on file  Occupational History   Occupation: DISABLED    Employer: DISABLED   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  Tobacco Use   Smoking status: Former    Current packs/day: 0.00    Average packs/day: 3.0 packs/day for 38.0 years (114.0 ttl pk-yrs)    Types: Cigarettes    Start date: 03/26/1966    Quit date: 03/25/2004    Years since quitting: 18.8   Smokeless tobacco: Former    Types: Chew    Quit date: 04/25/2004   Tobacco comments:    Former smoker 03/26/22  Vaping Use   Vaping status: Never Used  Substance and Sexual Activity   Alcohol  use: Not Currently    Comment:  quit drinking in 1989   Drug use: No   Sexual activity: Not Currently  Other Topics Concern   Not on file  Social History Narrative   Resides in [place] with his  wife   Two children and two grandchildren   Attends [employer]   Social Drivers of Health   Financial Resource Strain: Not on file  Food Insecurity: Low Risk  (10/08/2022)   Received from [facility]   Hunger Vital Sign    Worried About Running Out of Food in the Last Year: Never true    Ran Out of Food in the Last Year: Never true  Transportation Needs: No Transportation Needs (10/08/2022)   Received from [facility]    In the past 12 months, has lack of reliable transportation kept you from medical appointments, meetings, work or from getting things needed for daily living? : No  Physical Activity: Not on file  Stress: Not on file  Social Connections: Not on file    Allergies:  Allergies  Allergen Reactions   Penicillins Hives and Shortness Of Breath    Did it involve swelling of the face/tongue/throat, SOB, or low BP? Yes Did it involve sudden or severe rash/hives,  skin peeling, or any reaction on the inside of your mouth or nose? No Did you need to seek medical attention at a hospital or doctor's office? Yes (emergency room) When did it last happen?  1974 If all above answers are NO, may proceed with cephalosporin use.     Levofloxacin  Rash and Other (See Comments)    mood    Objective:    Vital Signs:   Temp:  [98 F (36.7 C)] 98 F (36.7 C) (01/03 1303) Pulse Rate:  [70-73] 73 (01/03 1600) Resp:  [13-19] 13 (01/03 1600) BP: (42-101)/(32-64) 100/64 (01/03 1600) SpO2:  [95 %-100 %] 100 % (01/03 1600)    Weight change: There were no vitals filed for this visit.  Intake/Output:   Intake/Output Summary (Last 24 hours) at 01/28/2023 1628 Last data filed at 01/28/2023 1510 Gross per 24 hour  Intake 1050 ml  Output --  Net 1050 ml    Physical Exam    General: ill-appearing, Thin. No distress on [place] HEENT: neck supple.   Cardiac: JVP to jaw. S1 and S2 present. No murmurs or rub. Resp: Lung sounds clear and equal B/L Abdomen: Soft, non-tender, non-distended. + BS. Extremities: cool, dry. No rash, cyanosis.  2+ edema.  Neuro: Alert and oriented x3. Flat affect. Generalized weakness  Telemetry   Paced in 70s (personally reviewed)  EKG    Apaced at 73 bpm (personally reviewed)  Labs   Basic Metabolic Panel: Recent Labs  Lab 01/28/23 1302  NA 137  K 4.2  CL 96*  CO2 12*  GLUCOSE 96  BUN 8  CREATININE 1.84*  CALCIUM  7.6*    Liver Function Tests: Recent Labs  Lab 01/28/23 1302  AST 33  ALT 15  ALKPHOS 74  BILITOT 1.7*  PROT 3.9*  ALBUMIN  1.7*   No results for input(s): LIPASE, AMYLASE in the last 168 hours. No results for input(s): AMMONIA in the last 168 hours.  CBC: Recent Labs  Lab 01/28/23 1302  WBC 23.2*  NEUTROABS 21.5*  HGB 8.6*  HCT 25.5*  MCV 95.1  PLT 204    Cardiac Enzymes: No results for input(s): CKTOTAL, CKMB, CKMBINDEX, TROPONINI in the last 168 hours.  BNP: BNP (last  3 results) Recent Labs    01/13/23 1634  BNP 721.6*    ProBNP (last 3 results) No results for input(s): PROBNP in the last 8760 hours.   CBG: No results for input(s): GLUCAP in the last 168  hours.  Coagulation Studies: Recent Labs    01/28/23 1302  LABPROT 35.4*  INR 3.5*    Imaging   DG Chest Port 1 View Result Date: 01/28/2023 CLINICAL DATA:  Questionable sepsis - evaluate for abnormality. EXAM: PORTABLE CHEST 1 VIEW COMPARISON:  01/25/2023. FINDINGS: There is mild pulmonary vascular congestion. There is left retrocardiac opacity partially obscuring the left medial hemidiaphragm and descending thoracic aorta, favoring combination of left lung atelectasis and/or consolidation. Bilateral lung fields are otherwise clear. No acute consolidation or lung collapse. Bilateral costophrenic angles are clear. Stable cardio-mediastinal silhouette. There is a left sided 2-lead pacemaker. No acute osseous abnormalities. The soft tissues are within normal limits. IMPRESSION: *Mild pulmonary vascular congestion.  Left basilar opacity. Electronically Signed   By: Ree Molt M.D.   On: 01/28/2023 15:11   Medications:    Current Medications:  atropine         Infusions:  norepinephrine  (LEVOPHED ) Adult infusion 15 mcg/min (01/28/23 1510)   vancomycin  1,750 mg (01/28/23 1524)   Patient Profile   Steve Anthony is a 73 y.o. male with past medical history of heart failure, paroxysmal A-fib on Eliquis , sick sinus syndrome s/p biV pacemaker in December of this past year, COPD, chronic kidney disease with a baseline creatinine of 1.2-1.5 who presents emergency department today due to weakness and fatigue of 1 week duration   Assessment/Plan   Mixed Shock, Cardiogenic on Septic Shock - presented to ED with hypotension. LA 8.8>9.5. WBC 23.2 - infectious workup: RVP neg, procal and blood cultures pending - in ED, received 2L LR and started on vanc/zosyn  - NE for goal MAP >65. On 15  currently - Start dobutamine  2.5 for RV support - Repeat Echo Right Ventricular Failure - Previously preserved EF 55-60% since 2014. - Echo 10/24 with newly reduced EF 35 to 40%. Thought to be tachy-mediated CM now s/p AV node ablation - Dr. Rolan at bedside for echo read thought that LV looked okay and close to normal, however RV function reduced.  - Place CVL and aline. Continue NE as above and start dobutamine  for RV support. CVP and Co-ox monitoring.  - Volume overloaded on exam. With concern for possible sepsis will defer diuresis until tomorrow - Jardiance  resently stopped for euglycemic DKA - hold GDMT with CGS - Repeat Echo pending CAD s/p PCI to mid LAD  in 2006 - CTA 2022 sev Lcx disease - CT 11/24 stable 3v cor calcification and LAD stent - no reported CP - On fenofibrate  OP (has not tolerated statins in the past) 4. Permanent Afib s/p AV node ablation and BiV pacemaker 12/24 - prev on amio with SE. Failed Tikosyn  05/2022 d/t poor rate control - Failed CV in 5/24 - pacing on tele - hold Eliquis  with anemia and recent +FOCT. Defer to tomorrow. 5. AKI on CKD3 - baseline sCr 1.0-1.5. On admit sCr 1.84 - cardio-renal - avoid hypotension 6. Anemia H/o GI bleed H/o Diverticulitis - +FOCT in 12/24 - h/o diverticulosis and hemorrhoids - Hgb 8.6 but think we can use heparin  gtt for AF for now. - obtain FOCT - Follow CBC closely 7. DMII - A1C 11.1>8.6 in one month - Metformin  discontinued with euglycemic DKA - SSI per primary 8. Ascending Aortic Aneurysm - 4.3 cm on CT 11/24 (of note left upper lobe pulm nodule and mediastinal and hilar lymph nodes) - Follows with Dr. Lucas 9. Recurrent idiopathic pancreatitis: chronic pancreatitis - s/p partial pancreatectomy - management per primary 10. Cryptogenic liver cirrhosis -  Prev thought to be NASH  - ?component cardio-hepatic syndrome  - Tbili 1.7, liver enzymes okay - management per primary  Length of Stay: 0  Jordan  Lee, NP  01/28/2023, 4:28 PM  Advanced Heart Failure Team Pager [phone] (M-F; 7a - 5p)  Please contact [facility] for night-coverage after hours (4p -7a ) and weekends on [email or web address]  Patient seen with NP, agree with the above note.   Extensive history as noted above.  Patient presented to the ER with weakness and was noted to be hypotensive, ?septic.  In the ER, he received 2L LR.  He was started on vancomycin /Zosyn  and then norepinephrine  which is now up to 15.  Lactate elevated at 9.5.   Currently, SBP 90s.  He is in chronic AF with BiV pacing (s/p AV nodal ablation).    I reviewed his echo, this showed EF 60-65%, moderately dilated and moderately dysfunctional RV, dilated IVC.   CT chest/abd/pelvis suggested possible early PNA at bases, cecal and proximal transverse colon thickening/colitis (?ischemic colitis), chronic calcific pancreatitis.   General: NAD Neck: JVP 10-12 cm, no thyromegaly or thyroid nodule.  Lungs: Clear to auscultation bilaterally with normal respiratory effort. CV: Nondisplaced PMI.  Heart regular S1/S2, no S3/S4, no murmur.  1+ edema to knees.  No carotid bruit.  Normal pedal pulses.  Abdomen: Soft, nontender, no hepatosplenomegaly, no distention.  Skin: Intact without lesions or rashes.  Neurologic: Alert and oriented x 3.  Psych: Normal affect. Extremities: No clubbing or cyanosis.  HEENT: Normal.   1. Shock: Suspect mixed septic/cardiogenic shock.  WBCs up to 23.  Source uncertain, ?PNA, ?bowel (thickening/colitis noted on CT).   Echo showed EF 60-65%, moderately dilated and moderately dysfunctional RV, dilated IVC.  This looks like primarily RV failure.  He is volume overloaded on exam.  He has been on Eliquis , so PE less likely though not ruled out by the non-PE protocol chest CT. Lactate 9.5 initially.  - Place central access and arterial line.  - Check procalcitonin.  - Currently with stable MAP on NE 15.  - RV failure/volume overload, would not give  further IV fluid. Hold off on diuresis until more stabilized, ?tomorrow.  - Would add dobutamine  2.5 for RV support.  2. RV failure: As above, echo showed EF 60-65%, moderately dilated and moderately dysfunctional RV, dilated IVC.  Volume overloaded on exam.  Primarily RV failure.  LV EF appears improved s/p AV nodal ablation and CRT-P placement in 12/24.   Have not ruled out PE, but think less likely given Eliquis  use.  3. Atrial fibrillation: Permanent, s/p AV nodal ablation and CRT-P.  - Would use heparin  gtt for now.  4. H/o GI bleeding/anemia: Hgb 8.6. Lower than prior, no overt bleeding.  Follow carefully.  - Heparin  gtt for now, stop with signs of overt bleeding.  - FOBT.  5. ID: Concern for septic shock component.  WBCs 23K.   - Blood cultures - procalcitonin - Empiric vancomycin /Zosyn .  6. Colitis: Noted on CT abdomen/pelvis.  ?Ischemic colitis with hypotension.  Abdomen is not tender on exam.  7. AKI on CKD stage 3: Creatinine 1.8, baseline 1.3-1.5.  Likely due to hypotension . - Support BP.  8. Chronic pancreatitis: Noted again on CT.   CRITICAL CARE Performed by: Ezra Shuck  Total critical care time: 50 minutes  Critical care time was exclusive of separately billable procedures and treating other patients.  Critical care was necessary to treat or prevent imminent or life-threatening deterioration.  Critical care was time spent personally by me on the following activities: development of treatment plan with patient and/or surrogate as well as nursing, discussions with consultants, evaluation of patient's response to treatment, examination of patient, obtaining history from patient or surrogate, ordering and performing treatments and interventions, ordering and review of laboratory studies, ordering and review of radiographic studies, pulse oximetry and re-evaluation of patient's condition.  Ezra Shuck 01/28/2023 6:28 PM

## 2023-01-28 NOTE — ED Triage Notes (Addendum)
 Pt arrives via ems from [facility] to the er for the c/o hypotension, dizziness weakness. Pt states he has been feeling weakness increasing over the past 2 weeks. Last bp 82/44 after 200ml NS. 84% RA placed on 4/L [place] new 02 sat 95%. HR 70-80. Pt lives at home, had a fall a few days ago, claims he fell and hit his back and head. Pt also recently treated for staph infection

## 2023-01-28 NOTE — Procedures (Signed)
 Arterial Catheter Insertion Procedure Note  Steve Anthony  [id number]  1950-02-19  Date:01/28/23  Time:7:28 PM    Provider Performing: Fonda JAYSON Sharps    Procedure: Insertion of Arterial Line (63379) with US  guidance (23062)   Indication(s) Blood pressure monitoring and/or need for frequent ABGs  Consent Risks of the procedure as well as the alternatives and risks of each were explained to the patient and/or caregiver.  Consent for the procedure was obtained and is signed in the bedside chart  Anesthesia None   Time Out Verified patient identification, verified procedure, site/side was marked, verified correct patient position, special equipment/implants available, medications/allergies/relevant history reviewed, required imaging and test results available.   Sterile Technique Maximal sterile technique including full sterile barrier drape, hand hygiene, sterile gown, sterile gloves, mask, hair covering, sterile ultrasound probe cover (if used).   Procedure Description Area of catheter insertion was cleaned with chlorhexidine  and draped in sterile fashion. With real-time ultrasound guidance an arterial catheter was placed into the left radial artery.  Appropriate arterial tracings confirmed on monitor.     Complications/Tolerance None; patient tolerated the procedure well.   EBL Minimal   Specimen(s) None   Sherlean Sharps AGACNP-BC   [facility] 01/28/2023, 7:29 PM  Please see [email or web address] for pager details.  From 7A-7P if no response, please call [phone]. After hours, please call ELink [phone].

## 2023-01-28 NOTE — ED Notes (Signed)
 EMS arrival. Bedside report given. Patient care transferred to EMS crew.

## 2023-01-28 NOTE — Progress Notes (Signed)
  Echocardiogram 2D Echocardiogram has been performed.  Delcie Roch 01/28/2023, 6:01 PM

## 2023-01-29 ENCOUNTER — Inpatient Hospital Stay (HOSPITAL_COMMUNITY): Payer: Medicare PPO

## 2023-01-29 DIAGNOSIS — A419 Sepsis, unspecified organism: Secondary | ICD-10-CM

## 2023-01-29 DIAGNOSIS — D62 Acute posthemorrhagic anemia: Secondary | ICD-10-CM

## 2023-01-29 DIAGNOSIS — S22020A Wedge compression fracture of second thoracic vertebra, initial encounter for closed fracture: Secondary | ICD-10-CM

## 2023-01-29 DIAGNOSIS — J9601 Acute respiratory failure with hypoxia: Secondary | ICD-10-CM

## 2023-01-29 DIAGNOSIS — R6521 Severe sepsis with septic shock: Secondary | ICD-10-CM

## 2023-01-29 DIAGNOSIS — B961 Klebsiella pneumoniae [K. pneumoniae] as the cause of diseases classified elsewhere: Secondary | ICD-10-CM

## 2023-01-29 DIAGNOSIS — B348 Other viral infections of unspecified site: Secondary | ICD-10-CM

## 2023-01-29 DIAGNOSIS — E872 Acidosis, unspecified: Secondary | ICD-10-CM

## 2023-01-29 DIAGNOSIS — R579 Shock, unspecified: Secondary | ICD-10-CM | POA: Diagnosis not present

## 2023-01-29 DIAGNOSIS — J189 Pneumonia, unspecified organism: Secondary | ICD-10-CM

## 2023-01-29 DIAGNOSIS — I5021 Acute systolic (congestive) heart failure: Secondary | ICD-10-CM

## 2023-01-29 LAB — COMPREHENSIVE METABOLIC PANEL
ALT: 16 U/L (ref 0–44)
AST: 35 U/L (ref 15–41)
Albumin: 1.8 g/dL — ABNORMAL LOW (ref 3.5–5.0)
Alkaline Phosphatase: 82 U/L (ref 38–126)
Anion gap: 23 — ABNORMAL HIGH (ref 5–15)
BUN: 8 mg/dL (ref 8–23)
CO2: 13 mmol/L — ABNORMAL LOW (ref 22–32)
Calcium: 8.2 mg/dL — ABNORMAL LOW (ref 8.9–10.3)
Chloride: 99 mmol/L (ref 98–111)
Creatinine, Ser: 1.52 mg/dL — ABNORMAL HIGH (ref 0.61–1.24)
GFR, Estimated: 49 mL/min — ABNORMAL LOW (ref >60)
Glucose, Bld: 142 mg/dL — ABNORMAL HIGH (ref 70–99)
Potassium: 4.4 mmol/L (ref 3.5–5.1)
Sodium: 135 mmol/L (ref 135–145)
Total Bilirubin: 2.1 mg/dL — ABNORMAL HIGH (ref 0.0–1.2)
Total Protein: 4.3 g/dL — ABNORMAL LOW (ref 6.5–8.1)

## 2023-01-29 LAB — EXPECTORATED SPUTUM ASSESSMENT W GRAM STAIN, RFLX TO RESP C

## 2023-01-29 LAB — RESPIRATORY PANEL BY PCR

## 2023-01-29 LAB — DIC (DISSEMINATED INTRAVASCULAR COAGULATION)PANEL
D-Dimer, Quant: <0.27 ug{FEU}/mL (ref 0.00–0.50)
Fibrinogen: 148 mg/dL — ABNORMAL LOW (ref 210–475)
INR: 3.4 — ABNORMAL HIGH (ref 0.8–1.2)
Platelets: 192 10*3/uL (ref 150–400)
Prothrombin Time: 34.9 s — ABNORMAL HIGH (ref 11.4–15.2)
Smear Review: NONE SEEN
aPTT: 83 s — ABNORMAL HIGH (ref 24–36)

## 2023-01-29 LAB — BLOOD CULTURE ID PANEL (REFLEXED) - BCID2

## 2023-01-29 LAB — GLUCOSE, CAPILLARY
Glucose-Capillary: 141 mg/dL — ABNORMAL HIGH (ref 70–99)
Glucose-Capillary: 133 mg/dL — ABNORMAL HIGH (ref 70–99)
Glucose-Capillary: 145 mg/dL — ABNORMAL HIGH (ref 70–99)
Glucose-Capillary: 125 mg/dL — ABNORMAL HIGH (ref 70–99)
Glucose-Capillary: 110 mg/dL — ABNORMAL HIGH (ref 70–99)
Glucose-Capillary: 132 mg/dL — ABNORMAL HIGH (ref 70–99)

## 2023-01-29 LAB — APTT
aPTT: 55 s — ABNORMAL HIGH (ref 24–36)
aPTT: 93 s — ABNORMAL HIGH (ref 24–36)

## 2023-01-29 LAB — CBC
HCT: 24.6 % — ABNORMAL LOW (ref 39.0–52.0)
Hemoglobin: 8.5 g/dL — ABNORMAL LOW (ref 13.0–17.0)
MCH: 31.6 pg (ref 26.0–34.0)
MCHC: 34.6 g/dL (ref 30.0–36.0)
MCV: 91.4 fL (ref 80.0–100.0)
Platelets: 203 10*3/uL (ref 150–400)
RBC: 2.69 MIL/uL — ABNORMAL LOW (ref 4.22–5.81)
RDW: 22.9 % — ABNORMAL HIGH (ref 11.5–15.5)
WBC: 23.7 10*3/uL — ABNORMAL HIGH (ref 4.0–10.5)
nRBC: 0 % (ref 0.0–0.2)

## 2023-01-29 LAB — PHOSPHORUS: Phosphorus: 2.9 mg/dL (ref 2.5–4.6)

## 2023-01-29 LAB — COOXEMETRY PANEL
Carboxyhemoglobin: 1.2 % (ref 0.5–1.5)
Methemoglobin: <0.7 % (ref 0.0–1.5)
O2 Saturation: 60.4 %
Total hemoglobin: 8.7 g/dL — ABNORMAL LOW (ref 12.0–16.0)

## 2023-01-29 LAB — MAGNESIUM: Magnesium: 2.2 mg/dL (ref 1.7–2.4)

## 2023-01-29 LAB — VANCOMYCIN, RANDOM: Vancomycin Rm: 12 ug/mL

## 2023-01-29 LAB — HEPARIN LEVEL (UNFRACTIONATED): Heparin Unfractionated: >1.1 [IU]/mL — ABNORMAL HIGH (ref 0.30–0.70)

## 2023-01-29 LAB — PROCALCITONIN: Procalcitonin: 3.53 ng/mL

## 2023-01-29 LAB — LACTIC ACID, PLASMA
Lactic Acid, Venous: 6.9 mmol/L (ref 0.5–1.9)
Lactic Acid, Venous: 5.2 mmol/L (ref 0.5–1.9)

## 2023-01-29 LAB — PROTIME-INR
INR: 3.4 — ABNORMAL HIGH (ref 0.8–1.2)
Prothrombin Time: 34.6 s — ABNORMAL HIGH (ref 11.4–15.2)

## 2023-01-29 LAB — STREP PNEUMONIAE URINARY ANTIGEN: Strep Pneumo Urinary Antigen: NEGATIVE

## 2023-01-29 MED ORDER — SODIUM CHLORIDE 0.9 % IV SOLN
1.0000 g | Freq: Two times a day (BID) | INTRAVENOUS | Status: DC
  Administered 2023-01-29 – 2023-01-30 (×3): 1 g via INTRAVENOUS
  Filled 2023-01-29 (×3): qty 20

## 2023-01-29 MED ORDER — CHLORHEXIDINE GLUCONATE CLOTH 2 % EX PADS
6.0000 | MEDICATED_PAD | Freq: Every day | CUTANEOUS | Status: DC
  Administered 2023-01-29 – 2023-02-01 (×4): 6 via TOPICAL

## 2023-01-29 MED ORDER — STERILE WATER FOR INJECTION IJ SOLN
INTRAMUSCULAR | Status: AC
  Administered 2023-01-29: 10 mL
  Filled 2023-01-29: qty 10

## 2023-01-29 MED ORDER — TAMSULOSIN HCL 0.4 MG PO CAPS
0.4000 mg | ORAL_CAPSULE | Freq: Every day | ORAL | Status: DC
  Administered 2023-01-29 – 2023-02-01 (×4): 0.4 mg via ORAL
  Filled 2023-01-29 (×4): qty 1

## 2023-01-29 MED ORDER — BENZONATATE 100 MG PO CAPS
200.0000 mg | ORAL_CAPSULE | Freq: Three times a day (TID) | ORAL | Status: DC | PRN
  Administered 2023-01-30 – 2023-01-31 (×2): 200 mg via ORAL
  Filled 2023-01-29 (×2): qty 2

## 2023-01-29 MED ORDER — ACETAMINOPHEN 325 MG PO TABS: 650.0000 mg | ORAL_TABLET | Freq: Four times a day (QID) | ORAL | Status: DC | PRN

## 2023-01-29 MED ORDER — OXYCODONE HCL 5 MG PO TABS
5.0000 mg | ORAL_TABLET | ORAL | Status: DC | PRN
  Administered 2023-01-29 – 2023-01-30 (×5): 10 mg via ORAL
  Administered 2023-01-31: 5 mg via ORAL
  Administered 2023-01-31 (×2): 10 mg via ORAL
  Filled 2023-01-29 (×8): qty 2

## 2023-01-29 MED ORDER — VANCOMYCIN HCL 1.5 G IV SOLR
1500.0000 mg | INTRAVENOUS | Status: DC
  Administered 2023-01-29: 1500 mg via INTRAVENOUS
  Filled 2023-01-29: qty 30

## 2023-01-29 MED ORDER — GUAIFENESIN ER 600 MG PO TB12
600.0000 mg | ORAL_TABLET | Freq: Two times a day (BID) | ORAL | Status: DC
  Administered 2023-01-29 – 2023-02-01 (×7): 600 mg via ORAL
  Filled 2023-01-29 (×7): qty 1

## 2023-01-29 MED ORDER — PROCHLORPERAZINE EDISYLATE 10 MG/2ML IJ SOLN
5.0000 mg | Freq: Once | INTRAMUSCULAR | Status: DC
  Filled 2023-01-29: qty 1

## 2023-01-29 MED ORDER — GABAPENTIN 100 MG PO CAPS
100.0000 mg | ORAL_CAPSULE | Freq: Three times a day (TID) | ORAL | Status: DC
  Administered 2023-01-29 – 2023-01-30 (×3): 100 mg via ORAL
  Filled 2023-01-29 (×3): qty 1

## 2023-01-29 MED ORDER — ENSURE ENLIVE PO LIQD
237.0000 mL | Freq: Three times a day (TID) | ORAL | Status: DC
  Administered 2023-01-31: 237 mL via ORAL

## 2023-01-29 MED ORDER — FENTANYL CITRATE PF 50 MCG/ML IJ SOSY
25.0000 ug | PREFILLED_SYRINGE | INTRAMUSCULAR | Status: DC | PRN
  Administered 2023-01-29 – 2023-02-01 (×9): 25 ug via INTRAVENOUS
  Filled 2023-01-29 (×9): qty 1

## 2023-01-29 MED ORDER — LIDOCAINE 5 % EX PTCH
1.0000 | MEDICATED_PATCH | CUTANEOUS | Status: DC
  Administered 2023-01-29 – 2023-01-31 (×3): 1 via TRANSDERMAL
  Filled 2023-01-29 (×3): qty 1

## 2023-01-29 NOTE — Progress Notes (Signed)
 PHARMACY - ANTICOAGULATION CONSULT NOTE  Pharmacy Consult for heparin  Indication: atrial fibrillation  Labs: Recent Labs    01/28/23 1302 01/28/23 2000 01/28/23 2023 01/28/23 2302 01/29/23 0456 01/29/23 1355 01/29/23 1523  HGB 8.6*   < > 8.8* 8.6* 8.5*  --   --   HCT 25.5*   < > 26.0* 25.0* 24.6*  --   --   PLT 204  --   --   --  203 192  --   APTT 41*  --   --   --  55* 83* 93*  LABPROT 35.4*  --   --   --  34.6* 34.9*  --   INR 3.5*  --   --   --  3.4* 3.4*  --   HEPARINUNFRC  --   --   --   --  >1.10*  --   --   CREATININE 1.84*  --   --   --  1.52*  --   --    < > = values in this interval not displayed.   Assessment: 72yo male subtherapeutic on heparin  with initial dosing while DOAC held; no infusion issues or signs of bleeding (other than some minor oozing from IV site but noted that he's been moving the area a lot) per RN.  PTT came back therapeutic at 93 this PM. We will continue with current and check level in AM.  Goal of Therapy:  aPTT 66-102 seconds   Plan:  Cont heparin  1350 units/hr. Check PTT/HL in AM  Sergio Batch, PharmD, [place], AAHIVP, CPP Infectious Disease Pharmacist 01/29/2023 4:08 PM

## 2023-01-29 NOTE — Progress Notes (Addendum)
 Patient ID: Steve Anthony, male   DOB: 22-Jul-1950, 73 y.o.   MRN: [id number]     Advanced Heart Failure Rounding Note  Cardiologist: None  Chief Complaint: lightheadedness Subjective:    This morning, he is on dobutamine  2.5 + NE 17.  MAP currently stable >65.  Co-ox 60%, lactate still elevated 6.9.  CVP 7.   WBCs 23, blood cultures with Klebsiella.  Respiratory viral panel with rhinovirus.   Agitated, complaining of low back pain.    Objective:   Weight Range: 85.6 kg Body mass index is 25.59 kg/m.   Vital Signs:   Temp:  [98 F (36.7 C)-98.3 F (36.8 C)] 98 F (36.7 C) (01/04 0751) Pulse Rate:  [70-90] 75 (01/04 0845) Resp:  [8-28] 19 (01/04 0845) BP: (42-137)/(32-117) 108/72 (01/04 0800) SpO2:  [87 %-100 %] 93 % (01/04 0854) Arterial Line BP: (86-170)/(30-147) 143/65 (01/04 0845) Weight:  [85.6 kg] 85.6 kg (01/04 0500) Last BM Date :  (PTA)  Weight change: Filed Weights   01/29/23 0500  Weight: 85.6 kg    Intake/Output:   Intake/Output Summary (Last 24 hours) at 01/29/2023 0913 Last data filed at 01/29/2023 0828 Gross per 24 hour  Intake 2515.73 ml  Output 1095 ml  Net 1420.73 ml      Physical Exam    General:  Agitated HEENT: Normal Neck: Supple. JVP 7. Carotids 2+ bilat; no bruits. No lymphadenopathy or thyromegaly appreciated. Cor: PMI nondisplaced. Regular rate & rhythm. No rubs, gallops or murmurs. Lungs: Clear Abdomen: Soft, nontender, nondistended. No hepatosplenomegaly. No bruits or masses. Good bowel sounds. Extremities: No cyanosis, clubbing, rash.  1+ edema to knees.  Neuro: Alert & orientedx3, cranial nerves grossly intact. moves all 4 extremities w/o difficulty. Affect pleasant   Telemetry   NSR 70s, personally reviewed   Labs    CBC Recent Labs    01/28/23 1302 01/28/23 2000 01/28/23 2302 01/29/23 0456  WBC 23.2*  --   --  23.7*  NEUTROABS 21.5*  --   --   --   HGB 8.6*   < > 8.6* 8.5*  HCT 25.5*   < > 25.0* 24.6*  MCV 95.1   --   --  91.4  PLT 204  --   --  203   < > = values in this interval not displayed.   Basic Metabolic Panel Recent Labs    98/96/74 1302 01/28/23 2000 01/28/23 2023 01/29/23 0456  NA 137  --  132* 135  K 4.2  --  4.6 4.4  CL 96*  --   --  99  CO2 12*  --   --  13*  GLUCOSE 96  --   --  142*  BUN 8  --   --  8  CREATININE 1.84*  --   --  1.52*  CALCIUM  7.6*  --   --  8.2*  MG  --  1.1*  --  2.2  PHOS  --   --   --  2.9   Liver Function Tests Recent Labs    01/28/23 1302 01/29/23 0456  AST 33 35  ALT 15 16  ALKPHOS 74 82  BILITOT 1.7* 2.1*  PROT 3.9* 4.3*  ALBUMIN  1.7* 1.8*   Recent Labs    01/28/23 2000  LIPASE 17   Cardiac Enzymes No results for input(s): CKTOTAL, CKMB, CKMBINDEX, TROPONINI in the last 72 hours.  BNP: BNP (last 3 results) Recent Labs    01/13/23 1634 01/28/23 2000  BNP 721.6* 2,380.8*    ProBNP (last 3 results) No results for input(s): PROBNP in the last 8760 hours.   D-Dimer No results for input(s): DDIMER in the last 72 hours. Hemoglobin A1C No results for input(s): HGBA1C in the last 72 hours. Fasting Lipid Panel No results for input(s): CHOL, HDL, LDLCALC, TRIG, CHOLHDL, LDLDIRECT in the last 72 hours. Thyroid Function Tests No results for input(s): TSH, T4TOTAL, T3FREE, THYROIDAB in the last 72 hours.  Invalid input(s): FREET3  Other results:   Imaging    DG CHEST PORT 1 VIEW Result Date: 01/28/2023 CLINICAL DATA:  Central line placement EXAM: PORTABLE CHEST 1 VIEW COMPARISON:  Chest x-ray 01/15/2023 FINDINGS: Right-sided central venous catheter tip ends in the proximal SVC. Left-sided pacemaker is unchanged. The heart is mildly enlarged, unchanged. There is no focal lung infiltrate, pleural effusion or pneumothorax. The osseous structures are stable. IMPRESSION: Right-sided central venous catheter tip ends in the proximal SVC. No pneumothorax. Electronically Signed   By: Greig Pique  M.D.   On: 01/28/2023 21:07   ECHOCARDIOGRAM COMPLETE Result Date: 01/28/2023    ECHOCARDIOGRAM REPORT   Patient Name:   Steve Anthony Date of Exam: 01/28/2023 Medical Rec #:  [id number]      Height:       72.0 in Accession #:    [id number]     Weight:       192.4 lb Date of Birth:  1950-07-09      BSA:          2.096 m Patient Age:    72 years       BP:           102/67 mmHg Patient Gender: M              HR:           78 bpm. Exam Location:  Inpatient Procedure: 2D Echo, Color Doppler and Cardiac Doppler STAT ECHO Indications:    acute systolic chf  History:        Patient has prior history of Echocardiogram examinations, most                 recent 11/19/2022. CHF, CAD, Pacemaker, COPD and chronic kidney                 disease, Arrythmias:Atrial Fibrillation; Risk Factors:Former                 Smoker, Sleep Apnea, Hypertension and Dyslipidemia.  Sonographer:    Tinnie Barefoot RDCS Referring Phys: [id number] RAVI AGARWALA IMPRESSIONS  1. Left ventricular ejection fraction, by estimation, is 60 to 65%. The left ventricle has normal function. The left ventricle has no regional wall motion abnormalities. Left ventricular diastolic parameters are indeterminate.  2. Right ventricular systolic function is moderately reduced. The right ventricular size is moderately enlarged. There is severely elevated pulmonary artery systolic pressure. The estimated right ventricular systolic pressure is 60.7 mmHg.  3. Left atrial size was moderately dilated.  4. Right atrial size was moderately dilated.  5. The mitral valve is normal in structure. Trivial mitral valve regurgitation. No evidence of mitral stenosis.  6. The aortic valve is tricuspid. Aortic valve regurgitation is not visualized. No aortic stenosis is present.  7. Aortic dilatation noted. There is mild dilatation of the ascending aorta, measuring 40 mm.  8. The inferior vena cava is dilated in size with <50% respiratory variability, suggesting right atrial pressure of  15 mmHg. FINDINGS  Left Ventricle:  Left ventricular ejection fraction, by estimation, is 60 to 65%. The left ventricle has normal function. The left ventricle has no regional wall motion abnormalities. The left ventricular internal cavity size was normal in size. There is  no left ventricular hypertrophy. Left ventricular diastolic parameters are indeterminate. Right Ventricle: The right ventricular size is moderately enlarged. No increase in right ventricular wall thickness. Right ventricular systolic function is moderately reduced. There is severely elevated pulmonary artery systolic pressure. The tricuspid regurgitant velocity is 3.38 m/s, and with an assumed right atrial pressure of 15 mmHg, the estimated right ventricular systolic pressure is 60.7 mmHg. Left Atrium: Left atrial size was moderately dilated. Right Atrium: Right atrial size was moderately dilated. Pericardium: There is no evidence of pericardial effusion. Mitral Valve: The mitral valve is normal in structure. There is mild calcification of the mitral valve leaflet(s). Mild mitral annular calcification. Trivial mitral valve regurgitation. No evidence of mitral valve stenosis. Tricuspid Valve: The tricuspid valve is normal in structure. Tricuspid valve regurgitation is mild. Aortic Valve: The aortic valve is tricuspid. Aortic valve regurgitation is not visualized. No aortic stenosis is present. Pulmonic Valve: The pulmonic valve was normal in structure. Pulmonic valve regurgitation is not visualized. Aorta: Aortic dilatation noted. There is mild dilatation of the ascending aorta, measuring 40 mm. Venous: The inferior vena cava is dilated in size with less than 50% respiratory variability, suggesting right atrial pressure of 15 mmHg. IAS/Shunts: No atrial level shunt detected by color flow Doppler. Additional Comments: A device lead is visualized in the right ventricle.  LEFT VENTRICLE PLAX 2D LVIDd:         5.50 cm   Diastology LVIDs:         3.10  cm   LV e' medial:    12.40 cm/s LV PW:         0.80 cm   LV E/e' medial:  8.8 LV IVS:        1.10 cm   LV e' lateral:   17.30 cm/s LVOT diam:     2.20 cm   LV E/e' lateral: 6.3 LV SV:         72 LV SV Index:   34 LVOT Area:     3.80 cm  RIGHT VENTRICLE             IVC RV Basal diam:  3.40 cm     IVC diam: 2.60 cm RV S prime:     19.90 cm/s TAPSE (M-mode): 1.4 cm LEFT ATRIUM             Index        RIGHT ATRIUM           Index LA Vol (A2C):   97.6 ml 46.57 ml/m  RA Area:     28.80 cm LA Vol (A4C):   96.3 ml 45.95 ml/m  RA Volume:   97.00 ml  46.28 ml/m LA Biplane Vol: 97.7 ml 46.62 ml/m  AORTIC VALVE LVOT Vmax:   119.00 cm/s LVOT Vmean:  80.900 cm/s LVOT VTI:    0.190 m  AORTA Ao Root diam: 4.00 cm Ao Asc diam:  4.00 cm MITRAL VALVE                TRICUSPID VALVE MV Area (PHT): 3.65 cm     TR Peak grad:   45.7 mmHg MV Decel Time: 208 msec     TR Vmax:        338.00 cm/s MV E velocity: 109.00  cm/s MV A velocity: 28.70 cm/s   SHUNTS MV E/A ratio:  3.80         Systemic VTI:  0.19 m                             Systemic Diam: 2.20 cm Marla Pouliot McleanMD Electronically signed by Ezra Kanner Signature Date/Time: 01/28/2023/6:12:35 PM    Final    CT CHEST ABDOMEN PELVIS W CONTRAST Result Date: 01/28/2023 CLINICAL DATA:  Weakness over the last 2 weeks with fall a few days ago. Head injury. Poly trauma blunt. EXAM: CT CHEST, ABDOMEN, AND PELVIS WITH CONTRAST TECHNIQUE: Multidetector CT imaging of the chest, abdomen and pelvis was performed following the standard protocol during bolus administration of intravenous contrast. RADIATION DOSE REDUCTION: This exam was performed according to the departmental dose-optimization program which includes automated exposure control, adjustment of the mA and/or kV according to patient size and/or use of iterative reconstruction technique. CONTRAST:  60mL OMNIPAQUE  IOHEXOL  350 MG/ML SOLN COMPARISON:  Prior CTs of the chest, abdomen and pelvis 01/13/2023. FINDINGS: CT CHEST FINDINGS  Cardiovascular: No acute vascular findings are seen. Left subclavian pacemaker leads extend into the right atrium, right ventricle and coronary sinus. There is atherosclerosis of the aorta, great vessels and coronary arteries. The heart size is normal. There is no pericardial effusion. Mediastinum/Nodes: There are no enlarged mediastinal, hilar or axillary lymph nodes. Small mediastinal lymph nodes appear unchanged. The thyroid gland, trachea and esophagus demonstrate no significant findings. Lungs/Pleura: Small bilateral pleural effusions have mildly increased since the previous CT. There is no evidence of pneumothorax. There is increased central airway thickening and dependent airspace opacities in both lungs which could reflect atelectasis or early pneumonia. Underlying mild to moderate centrilobular emphysema noted. Musculoskeletal/Chest wall: No chest wall mass or suspicious osseous findings. No evidence of acute fracture. Grossly stable mild superior endplate compression deformities at T2 and T7. Multilevel spondylosis. CT ABDOMEN AND PELVIS FINDINGS Hepatobiliary: Decreased density throughout the liver consistent with steatosis. No focal lesion or abnormal enhancement identified. No evidence of significant biliary dilatation status post cholecystectomy. Pancreas: Chronic calcifications throughout the pancreatic head and proximal body consistent with chronic calcific pancreatitis. There is marked atrophy of the pancreatic body and tail. Spleen: Status post splenectomy. Adrenals/Urinary Tract: Both adrenal glands appear normal. No evidence of urinary tract calculus, suspicious renal lesion or hydronephrosis. Unchanged small renal cysts bilaterally for which no specific follow-up imaging is recommended. The bladder appears unremarkable for its degree of distention, although is partially obscured by artifact from the right hip arthroplasty. Stomach/Bowel: No enteric contrast administered. The stomach appears  unremarkable for its degree of distention. No small bowel distension, wall thickening or surrounding inflammation. The appendix appears normal. Possible new wall thickening involving the cecum and proximal transverse colon. There are postsurgical changes consistent with previous partial left colectomy. Prominent stool throughout the colon. No evidence of bowel obstruction. Vascular/Lymphatic: There are no enlarged abdominal or pelvic lymph nodes. Aortic and branch vessel atherosclerosis without evidence of aneurysm or large vessel occlusion. Reproductive: The prostate gland and seminal vesicles appear unremarkable, although remain partially obscured by artifact from the right total hip arthroplasty. Other: No evidence of abdominal wall mass or hernia. No ascites or pneumoperitoneum. Mild generalized subcutaneous edema has developed. Musculoskeletal: No acute or significant osseous findings. Previous right total hip arthroplasty and lower lumbar fusion. Multilevel spondylosis and old pubic rami fractures bilaterally. IMPRESSION: 1. No evidence of acute traumatic injury  within the chest, abdomen or pelvis. 2. Increased central airway thickening and dependent airspace opacities in both lungs which could reflect atelectasis or early pneumonia. Small bilateral pleural effusions have mildly increased since the previous CT. 3. Possible new wall thickening involving the cecum and proximal transverse colon, possibly colitis. Correlate clinically. No evidence of bowel obstruction. 4. Chronic calcific pancreatitis. 5. Hepatic steatosis. 6. Aortic Atherosclerosis (ICD10-I70.0) and Emphysema (ICD10-J43.9). Electronically Signed   By: Elsie Perone M.D.   On: 01/28/2023 17:53   CT Head Wo Contrast Result Date: 01/28/2023 CLINICAL DATA:  Ataxia head injury.  Trauma. EXAM: CT HEAD WITHOUT CONTRAST TECHNIQUE: Contiguous axial images were obtained from the base of the skull through the vertex without intravenous contrast.  RADIATION DOSE REDUCTION: This exam was performed according to the departmental dose-optimization program which includes automated exposure control, adjustment of the mA and/or kV according to patient size and/or use of iterative reconstruction technique. COMPARISON:  12/31/2019 FINDINGS: Brain: No evidence of acute infarction, hemorrhage, hydrocephalus, extra-axial collection or mass lesion/mass effect. Vascular: No hyperdense vessel or unexpected calcification. Skull: Normal. Negative for fracture or focal lesion. Sinuses/Orbits: Mastoid air cells are clear. Mucosal thickening is noted involving the right maxillary sinus. No sinus fluid levels. Other: None. IMPRESSION: 1. No acute intracranial abnormalities. 2. Right maxillary sinus disease. Electronically Signed   By: Waddell Calk M.D.   On: 01/28/2023 17:40   DG Chest Port 1 View Result Date: 01/28/2023 CLINICAL DATA:  Questionable sepsis - evaluate for abnormality. EXAM: PORTABLE CHEST 1 VIEW COMPARISON:  01/25/2023. FINDINGS: There is mild pulmonary vascular congestion. There is left retrocardiac opacity partially obscuring the left medial hemidiaphragm and descending thoracic aorta, favoring combination of left lung atelectasis and/or consolidation. Bilateral lung fields are otherwise clear. No acute consolidation or lung collapse. Bilateral costophrenic angles are clear. Stable cardio-mediastinal silhouette. There is a left sided 2-lead pacemaker. No acute osseous abnormalities. The soft tissues are within normal limits. IMPRESSION: *Mild pulmonary vascular congestion.  Left basilar opacity. Electronically Signed   By: Ree Molt M.D.   On: 01/28/2023 15:11     Medications:     Scheduled Medications:  Chlorhexidine  Gluconate Cloth  6 each Topical Daily   mometasone -formoterol   2 puff Inhalation BID   pantoprazole  (PROTONIX ) IV  40 mg Intravenous Q24H   revefenacin   175 mcg Nebulization Daily   vancomycin  variable dose per unstable renal  function (pharmacist dosing)   Does not apply See admin instructions    Infusions:  sodium chloride      DOBUTamine  4 mcg/kg/min (01/29/23 0910)   heparin  1,350 Units/hr (01/29/23 0800)   meropenem  (MERREM ) IV Stopped (01/29/23 9367)   norepinephrine  (LEVOPHED ) Adult infusion 18 mcg/min (01/29/23 0854)   vancomycin       PRN Medications: Place/Maintain arterial line **AND** sodium chloride , acetaminophen , albuterol , docusate sodium , fentaNYL  (SUBLIMAZE ) injection, guaiFENesin , polyethylene glycol    Assessment/Plan   1. Shock: Suspect mixed septic/cardiogenic shock (primarily septic).  WBCs 23, PCT 3.53.  Klebsiella in blood cultures.   Echo showed EF 60-65%, moderately dilated and moderately dysfunctional RV, dilated IVC.  This looks like primarily RV failure.   He has been on Eliquis , so PE less likely though not ruled out by the non-PE protocol chest CT. Lactate 9.5 initially => 6.9 today.  Co-ox 60%, CVP 7.  - With marginal co-ox and lactate still elevated, increase dobutamine  to 4 for RV support.    - Currently with stable MAP on NE 17, can titrate down as tolerated.  -  No diuretic with septic shock/CVP 7.  2. RV failure: As above, echo showed EF 60-65%, moderately dilated and moderately dysfunctional RV, dilated IVC.  Primarily RV failure.  LV EF appears improved s/p AV nodal ablation and CRT-P placement in 12/24 (had been 35-40% on prior echo).   Have not ruled out PE, but think less likely given Eliquis  use.  3. Atrial fibrillation: Permanent, s/p AV nodal ablation and CRT-P.  - Would use heparin  gtt for now.  4. H/o GI bleeding/anemia: Hgb 8.6. => 8.5. Lower than prior, no overt bleeding.  Follow carefully.  - Heparin  gtt for now, stop with signs of overt bleeding.  - FOBT.  5. ID: Concern for primarily septic shock.  WBCs 23K, PCT 3.53.  CT chest/abd/pelvis with colitis noted.  Blood cultures growing Klebsiella, respiratory virus panel with rhinovirus.  - Currently on  vancomycin /meropenem , can likely drop vancomycin .  - Patient has new CRT-P device. Klebsiella is only rare cause of endocarditis.  6. Colitis: Noted on CT abdomen/pelvis.  ?Ischemic colitis with hypotension.  Abdomen is not tender on exam.  7. AKI on CKD stage 3: Creatinine 1.8 => 1.5 today, baseline 1.3-1.5.  Likely due to hypotension and now improving with medical management. - Support BP.  8. Chronic pancreatitis: Noted again on CT.  9. Back pain: Patient has chronic low back pain, on oxycodone  at home.  Now on Fentanyl  boluses.  Worsening pain while lying in bed and agitated.  - Increase to Fentanyl  to 50 mcg per dose.    CRITICAL CARE Performed by: Ezra Shuck  Total critical care time: 40 minutes  Critical care time was exclusive of separately billable procedures and treating other patients.  Critical care was necessary to treat or prevent imminent or life-threatening deterioration.  Critical care was time spent personally by me on the following activities: development of treatment plan with patient and/or surrogate as well as nursing, discussions with consultants, evaluation of patient's response to treatment, examination of patient, obtaining history from patient or surrogate, ordering and performing treatments and interventions, ordering and review of laboratory studies, ordering and review of radiographic studies, pulse oximetry and re-evaluation of patient's condition.   Length of Stay: 1  Ezra Shuck, MD  01/29/2023, 9:13 AM  Advanced Heart Failure Team Pager [phone] (M-F; 7a - 5p)  Please contact [facility] for night-coverage after hours (5p -7a ) and weekends on [email or web address]

## 2023-01-29 NOTE — Progress Notes (Signed)
 NAME:  Steve Anthony, MRN:  [id number], DOB:  10-Jan-1951, LOS: 1 ADMISSION DATE:  01/28/2023, CONSULTATION DATE:  01/27/22 REFERRING MD:  Dr. Mannie, CHIEF COMPLAINT:  hypotension   History of Present Illness:   34 yoM with PMH significant for but not limited to COPD, OSA, HFrEF, CAD, HTN, PAF on eliquis , sick sinus syndrome s/p PPM 12/27/22, CKD, DMT2, GERD, cirrhosis, hyperthyroidism, pancreatitis, chronic back pain, melanoma, and IDA who initially presented to urgent care today with complaints of increasing generalized weakness over 2 weeks, poor PO intake, and cough found to be hypotensive with SBP in 60's, 89% on room air.  Daughter at bedside who is an ER nurse and able to provide more history.  Reports pt has had progressive decline over several weeks.  Had staph infection in right forearm after a skin biopsy treated in November, then had PPM and ablation with EP on 12/2 for sick sinus and prior failed treatment for afib.  Was admitted 12/19 with dehydration and poor PO intake and ongoing low BP's in outpt setting found to have elevated lactic thought possibly to home jardiance  and metformin .  Taken off lopressor , jardiance  in December.  BP improved and he was discharged home next day to be able to see his mother prior to her passing away.  Since, pt continues to have poor PO intake and reports nothing taste good.  Reported 67 lb unintentional wt loss since June 2024.  Since his discharge, pt also has had dizzy spells and multiple falls at home, last on 12/31 in which he was evaluated at [facility] ER and discharged home.  Sleeps a lot per daughter.  Progressive BLE swelling despite taking lasix  at home.  Ongoing cough but now productive yellow over last week but denies any SOB.  Denies any pain, fever, N/V/D, chest/ abdominal pain, or hematemesis, hemoptysis, or bloody stools.  Reports some dysuria.  Last took meds, including eliquis  01/26/22 PM.  In ER, temp 99., initial SBP in 60's, and 89% on room air.   Given 2L LR and placed on peripheral NE for ongoing hypotension.      Labs noted for WBC 23, lactic 8.8 rising to 9.5 despite fluids, Hgb 8.6 (down from previous 11.1 on 12/20).  CTH and CT chest/ abd/ pelvis ordered and pending.  CXR showed mild vascular congestion and possible LLL retrocardiac opacity.  Bedside pocus echo per EDP concerning for poor EF, possibly 25%.  Pt has not required any supplemental O2 in ER, maintaining on room air.  PCCM called for admit.   Pertinent  Medical History   Past Medical History:  Diagnosis Date   Abnormal CT of the abdomen 05/27/2018   Abnormal MRI of abdomen 04/14/2018   Acute pancreatitis 02/20/2019   Anemia    Anxiety disorder 04/09/2015   Arthritis    Asthma    Atrial fibrillation (HCC) 11/21/2008   Qualifier: Diagnosis of   By: Waddell, MD, CODY Danelle Fallow        CAD (coronary artery disease)    a. S/P PCI mid LAD (vision stent) 08/31/2004;  b. repeat cath 6/11 - no progression of CAD. c. Cath 10/2012 given moderate CAD on cardiac CT - no change from prior cath.   Centrilobular emphysema (HCC) 10/08/2022   Chest CT 08/04/2022     Chest pain 12/16/2016   CHEST PAIN UNSPECIFIED 11/21/2008   Qualifier: Diagnosis of   By: Ardelia EMT-P, Tara         CHF (congestive heart failure) (  HCC), reduced LVEF 35 to 40% by TTE 11/19/2022, new decrease compared to prior echocardiogram 2022.  Likely tachycardia mediated in the setting of A-fib RVR 12/17/2022   Chronic respiratory failure with hypoxia (HCC) 02/24/2017   Chronic systolic CHF (congestive heart failure) (HCC) 01/14/2023   Cirrhosis of liver without ascites (HCC) 04/14/2018   CKD (chronic kidney disease), stage III (HCC) 02/20/2019   COPD (chronic obstructive pulmonary disease) (HCC)    COPD without exacerbation (HCC) 05/15/2009   Qualifier: Diagnosis of   By: Jude MD, Harden GAILS.         COVID-19 virus infection    Cyst of nipple, right 11/03/2020   Degeneration of lumbar intervertebral disc  04/09/2015   Diverticulitis    Dyslipidemia    Essential hypertension 10/15/2008   Qualifier: Diagnosis of   By: Rolan Hough         Gallstones    GERD 10/15/2008   Qualifier: Diagnosis of   By: Rolan Hough         High risk medication use 04/09/2015   History of COVID-19 01/24/19 02/20/2019   History of kidney stones    History of pelvic fracture 01/01/2020   History of tattoo 08/23/2016   History of UTI 11/28/2019   Hypercoagulable state due to persistent atrial fibrillation (HCC) 03/26/2022   Hyperlipidemia 10/15/2008   Qualifier: Diagnosis of   By: Rolan Hough         Hypertension    Hyperthyroidism    HYPERTRIGLYCERIDEMIA 10/15/2008   Qualifier: Diagnosis of   By: Rolan Hough         Hypotension 06/13/2019   Idiopathic acute pancreatitis 04/14/2018   Incisional hernia, without obstruction or gangrene 02/05/2016   Infected pancreatic pseudocyst 06/07/2019   Insomnia 04/09/2015   Iron deficiency anemia 05/03/2022   Kidney mass 07/06/2017   Following with urology. Chao.  Recommend follow up. Last seen 01/2018  Refuses to see again.   CT okay 04/2021     Long term (current) use of anticoagulants [Z79.01] 06/10/2015   Melanoma (HCC)    melanoma removed from neck    Mild episode of recurrent major depressive disorder (HCC) 08/27/2015   NEPHROLITHIASIS 10/15/2008   Qualifier: Diagnosis of   By: Rolan Hough         OA (osteoarthritis) of hip 12/18/2014   On home oxygen therapy    2L q hs (11/08/2012),  06/04/19 pt. not on oxygen   OSA (obstructive sleep apnea)    a. cpap noncompliance- patient reports on 12/10/14- does not use CPAP   Palpitations    a. 10/2012 - s/p LINQ loop recorder to assess for arrhythmia.    Pancreatic cyst 04/14/2018   Pancreatic pseudocyst 06/07/2019   Pancreatitis    Paroxysmal A-fib (HCC) 11/21/2008   a. S/P PVI 2/11 and 9/11   Paroxysmal atrial fibrillation (HCC) 01/14/2023   Pelvic fracture (HCC) 12/31/2019   Persistent atrial  fibrillation (HCC) 06/08/2022   PICC (peripherally inserted central catheter) in place    Pneumonia    Protein-calorie malnutrition, severe 06/12/2019   Pseudocyst of pancreas 03/06/2019   Pure hypercholesterolemia    S/P coronary artery stent placement    SHORTNESS OF BREATH 03/19/2009   Qualifier: Diagnosis of   By: Kelsie MD, James         Sleep apnea 05/17/2009   Qualifier: Diagnosis of   By: Jude MD, Harden GAILS.         SVT (supraventricular tachycardia) (HCC)    a. Atypical  AVNRT of the slow pathway   Thoracic aortic aneurysm (HCC) 11/13/2020   Type II diabetes mellitus (HCC)    Unstable angina (HCC) 10/15/2008   Qualifier: Diagnosis of   By: Rolan Hough         [facility] Events: Including procedures, antibiotic start and stop dates in addition to other pertinent events   1/3 Admitted   Interim History / Subjective:  Overnight no acute events.   Objective   Blood pressure (!) 91/58, pulse 73, temperature 98 F (36.7 C), temperature source Oral, resp. rate 17, weight 85.6 kg, SpO2 94%. CVP:  [5 mmHg-67 mmHg] 10 mmHg      Intake/Output Summary (Last 24 hours) at 01/29/2023 1234 Last data filed at 01/29/2023 1200 Gross per 24 hour  Intake 3059.95 ml  Output 1295 ml  Net 1764.95 ml   Filed Weights   01/29/23 0500  Weight: 85.6 kg    Examination: General: Ill-appearing man lying in bed in mild discomfort HEENT: [place]/AT, eyes anicteric, right IJ CVC with mild bruising Neuro: Awake and alert, answering questions appropriately, moving around in bed independently CV: S1-S2, paced rhythm, no murmurs PULM: Breathing comfortably on nasal cannula, occasional wet sounding cough.   GI: Soft, nontender, nondistended Extremities: Significant bruising-appears old on lower extremities Skin: Warm, dry, no diffuse rashes  Bicarb 13 BUN 8 Creatinine 1.52 Bilirubin 2.1, normal transaminases Lactic acid 6.9, downtrending Procalcitonin 3.53 WBC 23.7 H/H  8.5/24.6 Platelets 203 INR 3.4 Random Vanc level 12 DIC panel-INR 3.4, PTT 55, Blood cultures-Klebsiella on bio fire  UA 21-50 WBC Urine culture pending  Echo: LVEF 60-65%, RV moderately reduced, moderately enlarged. Severely elevated PASP. LA and RA moderately dilated. Trivial MR, no other valve disease. Dilated IVC with reduced variability. Aortic dilation.  CT chest personally reviewed-small dependent bilateral pleural effusions with overlying atelectasis, no obvious infiltrates.  Airway thickening in this area suggest chronic aspiration.  Emphysema.  Evidence of biliary disease.  Steatosis.  Calcifications throughout pancreas.  Resolved Hospital Problem list    Assessment & Plan:   Shock- appears septic due to GNR bacteremia, possibly a component of chronic heart failure causing cardiogenic shock. K. Pneumoniae bacteremia. Daughter said he felt like he has had a UTI for a week and he has progressive lung infiltrates on CT today. -repeat blood cultures today to document clearance since he has a new cardiac device last month -vasopressors to maintain MAP >65 -deescalate antibiotics- can stop vanc, con't zosyn  -appreciate AHF's management; titrating milrinone -serial coox -recheck lactic acid; very slowly clearing likely due to renal failure and PTA metformin  use, but source control could contribute as well hypothetically -urine culture pending-- need to confirm this was source of bacteremia versus possible pulmonary source, although CT is not convincing for bacterial lobar pneumonia -collect sputum culture  AoC HFrEF and RV failure  Sick sinus syndrome s/p PPM 12/27/22 Afib s/p ablation - looks either junction vs apaced at times.  Rate regular - prior echo 35-40%, indeterminate diastolic, RV mod reduced  - echo pending - appreciate further recs per AHF team  Rhinovirus infection - Droplet precautions -Mucinex  -Tessalon  as needed  AGMA/ lactic acidosis, previously on metformin   may contribute to slow clearance -Recheck lactic acid this morning, recheck again tomorrow morning to document ongoing clearance -Per daughter he was supposed to be off metformin  but had confused his medications and stopped metoprolol  instead.  ARDS been previous concern for lactic acidosis due to metformin .  Recommend against restarting this medication.  AKI  H/o urinary retention -Strict I's/O - Renally dose meds and avoid nephrotoxic meds - Maintain adequate renal perfusion - Continue Foley catheter for now -con't PTA tamsulosin ; hopefully can d/c foley soon  Acute normocytic anemia/ hx IDA> suspect the acute drop in his hemoglobin is due to significant bruising after multiple recent falls. Coagulopathy/ elevated INR (on eliquis ) -So far no drop in hemoglobin this admission, continue to monitor - Holding Eliquis ; on heparin  infusion currently - Transfuse for hemoglobin less than 7 or hemodynamically significant bleeding -Check DIC panel - Iron studies suggest iron deficiency anemia, ferritin high due to acute bacteremia> will supplement iron when bacteremia is controlled  Recent falls and back pain, daughter concerned that he has not been worked up for possible spinal fractures; she confirms leg weakness predates the falls.  Has some mild touch tenderness over upper thoracic vertebral bodies.  No C-spine tenderness. Chronic LBP, multiple previous surgeries - resume PTA oxy PRN; hold baclofen  until AKI improves more. Gabapentin  started at reduced dose due to AKI. - Thoracic and C-spine CT> informs mild anterior compression fracture on T2, not near spinal canal. -bracing not recommended due to muscle atrophy; primary pain control is prevention, treatment of osteoporosis, and pain control> can add lidocaine  patch, tylenol  PRN  DMT2, hyperglycemia- controlled without insulin . Prior A1c 6.2 on 12/27/22, -accuchecks Q4h, SSI PRN -goal BG 140-180 -cont to hold metformin .  Taken off jardiance   in December; was supposed to stop metformin  concurrently.  GERD - daily PPI   Hyperbilirubinemia likely due to sepsis, no evidence of biliary disease. - Maintain adequate perfusion - Monitor -Check DIC panel> no schistocytes  Severe protein calorie malnutrition, nearly 70 lb unintentional wt loss reported since 06/2022 Severe deconditioning, ?FTT, frequent falls -encourage PO intake; ensure TID added -RD consulted; may need cortrak if not eating -calorie count  Acute respiratory failure with hypoxia, acute pneumonia- viral vs bacterial Hx COPD, OSA (non compliant w/ CPAP x 83yrs per pt) -on breztri PTA; con't triple inhaled therapy with yupelri  + dulera  -collect respiratory culture -supplemental O2 as required to maintain SpO2 >90%  Vision loss PTA; hasn't been able to follow up with ophtho, family denies glaucoma, macular degeneration, diabetic retinopathy history.  Home blood sugars have been less than 200. - Updated family that this may not be resolved prior to discharge.  He does need follow-up with ophthalmology as an outpatient.  Diarrhea - Check for C. difficile  GOC - At admission pt verbalized wishes of DNR/ DNI but wishes for all other aggressive medical care and interventions including CVL/ pressors.    Wife & daughter updated during rounds (daughter via phone). After Ct results available I called tonight to update daughter again. They both have significant concerns about his chronic issues and his long-term prognosis.    Best Practice (right click and Reselect all SmartList Selections daily)   Diet/type: Regular consistency (see orders) DVT prophylaxis SCD Pressure ulcer(s): N/A> skin tear to R forearm POA GI prophylaxis: PPI Lines: Central line and Arterial Line Foley:  Yes, and it is still needed Code Status:  DNR/ DNI per pt's expressed wishes, daughter present- ok with CVL/ pressors, other aggressive medical care Last date of multidisciplinary goals of care  discussion [1/4]  Labs   CBC: Recent Labs  Lab 01/28/23 1302 01/28/23 2000 01/28/23 2023 01/28/23 2302 01/29/23 0456  WBC 23.2*  --   --   --  23.7*  NEUTROABS 21.5*  --   --   --   --  HGB 8.6* 8.6* 8.8* 8.6* 8.5*  HCT 25.5* 24.9* 26.0* 25.0* 24.6*  MCV 95.1  --   --   --  91.4  PLT 204  --   --   --  203    Basic Metabolic Panel: Recent Labs  Lab 01/28/23 1302 01/28/23 2000 01/28/23 2023 01/29/23 0456  NA 137  --  132* 135  K 4.2  --  4.6 4.4  CL 96*  --   --  99  CO2 12*  --   --  13*  GLUCOSE 96  --   --  142*  BUN 8  --   --  8  CREATININE 1.84*  --   --  1.52*  CALCIUM  7.6*  --   --  8.2*  MG  --  1.1*  --  2.2  PHOS  --   --   --  2.9   GFR: Estimated Creatinine Clearance: 48.9 mL/min (A) (by C-G formula based on SCr of 1.52 mg/dL (H)). Recent Labs  Lab 01/28/23 1302 01/28/23 1317 01/28/23 1453 01/28/23 2000 01/29/23 0456  PROCALCITON  --   --   --  4.79 3.53  WBC 23.2*  --   --   --  23.7*  LATICACIDVEN  --  8.8* 9.5* 8.2* 6.9*    Liver Function Tests: Recent Labs  Lab 01/28/23 1302 01/29/23 0456  AST 33 35  ALT 15 16  ALKPHOS 74 82  BILITOT 1.7* 2.1*  PROT 3.9* 4.3*  ALBUMIN  1.7* 1.8*   Recent Labs  Lab 01/28/23 2000  LIPASE 17   No results for input(s): AMMONIA in the last 168 hours.  ABG    Component Value Date/Time   PHART 7.353 01/28/2023 2023   PCO2ART 19.2 (LL) 01/28/2023 2023   PO2ART 68 (L) 01/28/2023 2023   HCO3 10.7 (L) 01/28/2023 2023   TCO2 11 (L) 01/28/2023 2023   ACIDBASEDEF 13.0 (H) 01/28/2023 2023   O2SAT 60.4 01/29/2023 0456     Coagulation Profile: Recent Labs  Lab 01/28/23 1302 01/29/23 0456  INR 3.5* 3.4*    Critical care time:       This patient is critically ill with multiple organ system failure which requires frequent high complexity decision making, assessment, support, evaluation, and titration of therapies. This was completed through the application of advanced monitoring technologies  and extensive interpretation of multiple databases. During this encounter critical care time was devoted to patient care services described in this note for 60 minutes.  Leita SHAUNNA Gaskins, DO 01/29/23 6:40 PM [facility]  For contact information, see Amion. If no response to pager, please call PCCM consult pager. After hours, 7PM- 7AM, please call Elink.

## 2023-01-29 NOTE — Progress Notes (Signed)
 Pharmacy Antibiotic Note  Steve Anthony is a 73 y.o. male admitted on 01/28/2023 with increasing generalized weakness over 2 weeks, poor PO intake, and cough found to be hypotensive with SBP in 60's, 89% on room air. Past medical history of COPD, OSA, HFrEF, CAD, HTN, PAF on eliquis , sick sinus syndrome s/p PPM 12/27/22, CKD, DMT2, GERD, cirrhosis, hyperthyroidism, pancreatitis, and IDA. Antibiotics started for shock (? Septic +/- cardiogenic). Pharmacy has been consulted for vancomycin /Zosyn  dosing.  Lactic acid 9.5 > 6.9. WBC 23.7. BP was 56/39 >> improved to 102/67 (MAP 77) on vasopressors. Afebrile. Scr 1.84 > 1.52 (B/L ~ 1.1-1.2). Was given 1750 mg vancomycin  and 3.375 g Zosyn  in ED.   Random vancomycin  level this AM is 12 - drawn ~ 12 hrs after loading dose.  Plan: Continue Zosyn  3.375g IV q8h (4 hour infusion). Will adjust based on Scr trend and UOP, for now estimates > 20 mL/min.   Vancomycin  1500 mg IV q 24 hrs for now.  Will need to watch renal function closely.  Weight: 85.6 kg (188 lb 11.4 oz)  Temp (24hrs), Avg:98.5 F (36.9 C), Min:98 F (36.7 C), Max:99.9 F (37.7 C)  Recent Labs  Lab 01/28/23 1302 01/28/23 1317 01/28/23 1453 01/28/23 2000 01/29/23 0456  WBC 23.2*  --   --   --  23.7*  CREATININE 1.84*  --   --   --  1.52*  LATICACIDVEN  --  8.8* 9.5* 8.2* 6.9*  VANCORANDOM  --   --   --   --  12    Estimated Creatinine Clearance: 48.9 mL/min (A) (by C-G formula based on SCr of 1.52 mg/dL (H)).    Allergies  Allergen Reactions   Penicillins Hives and Shortness Of Breath    Tolerated two doses of Zosyn  Jan 2025   Levaquin  [Levofloxacin ] Rash and Other (See Comments)    Alters mood    Antimicrobials this admission:  Zosyn  1/3 > 1/4 Vancomycin  1/3 >  Meropenem  1/4 >   Dose adjustments this admission:  1/4 VR = 12 drawn ~ 12 hrs after load - start 1500 mg q 24 hrs  Microbiology results:  1/3 BCID - ESBL Kleb in 1/3 bottles 1/3 Resp virus +  Rhinovirus/enterovirus 1/3 UCx >  1/3 MRSA - neg  Thank you for allowing pharmacy to be a part of this patient's care.   Harlene Barlow, [employer], [facility] Clinical Pharmacist  01/29/2023 8:54 AM   [facility] pharmacy phone numbers are listed on [email or web address]

## 2023-01-29 NOTE — Plan of Care (Signed)
  Problem: Education: Goal: Knowledge of General Education information will improve Description: Including pain rating scale, medication(s)/side effects and non-pharmacologic comfort measures Outcome: Not Progressing   Problem: Health Behavior/Discharge Planning: Goal: Ability to manage health-related needs will improve Outcome: Not Progressing   Problem: Clinical Measurements: Goal: Ability to maintain clinical measurements within normal limits will improve Outcome: Not Progressing Goal: Will remain free from infection Outcome: Not Progressing Goal: Diagnostic test results will improve Outcome: Not Progressing Goal: Respiratory complications will improve Outcome: Not Progressing Goal: Cardiovascular complication will be avoided Outcome: Not Progressing   Problem: Activity: Goal: Risk for activity intolerance will decrease Outcome: Not Progressing   Problem: Nutrition: Goal: Adequate nutrition will be maintained Outcome: Not Progressing   Problem: Coping: Goal: Level of anxiety will decrease Outcome: Not Progressing   Problem: Elimination: Goal: Will not experience complications related to bowel motility Outcome: Not Progressing Goal: Will not experience complications related to urinary retention Outcome: Not Progressing   Problem: Pain Management: Goal: General experience of comfort will improve Outcome: Not Progressing   Problem: Safety: Goal: Ability to remain free from injury will improve Outcome: Not Progressing   Problem: Skin Integrity: Goal: Risk for impaired skin integrity will decrease Outcome: Not Progressing   Problem: Education: Goal: Ability to describe self-care measures that may prevent or decrease complications (Diabetes Survival Skills Education) will improve Outcome: Not Progressing Goal: Individualized Educational Video(s) Outcome: Not Progressing   Problem: Coping: Goal: Ability to adjust to condition or change in health will  improve Outcome: Not Progressing   Problem: Fluid Volume: Goal: Ability to maintain a balanced intake and output will improve Outcome: Not Progressing   Problem: Health Behavior/Discharge Planning: Goal: Ability to identify and utilize available resources and services will improve Outcome: Not Progressing Goal: Ability to manage health-related needs will improve Outcome: Not Progressing   Problem: Metabolic: Goal: Ability to maintain appropriate glucose levels will improve Outcome: Not Progressing   Problem: Nutritional: Goal: Maintenance of adequate nutrition will improve Outcome: Not Progressing Goal: Progress toward achieving an optimal weight will improve Outcome: Not Progressing   Problem: Skin Integrity: Goal: Risk for impaired skin integrity will decrease Outcome: Not Progressing   Problem: Tissue Perfusion: Goal: Adequacy of tissue perfusion will improve Outcome: Not Progressing   Problem: Fluid Volume: Goal: Hemodynamic stability will improve Outcome: Not Progressing   Problem: Clinical Measurements: Goal: Diagnostic test results will improve Outcome: Not Progressing Goal: Signs and symptoms of infection will decrease Outcome: Not Progressing   Problem: Respiratory: Goal: Ability to maintain adequate ventilation will improve Outcome: Not Progressing

## 2023-01-29 NOTE — Progress Notes (Addendum)
 0710 patient restless in bed reported no sleep at all last night on pressors able to decreased based off BP  0800 complete bath done after patient urinated in bed education on keeping central line and art-line from being pulled pout patient wrapping self in lines concerns for dislodging lines. Patient has chronic back pain and very restless in a lot pain, also coughing a lot keeping him awake PRN meds given 0920 Pain meds changed and given to patient as ordered  1000 pain still not relived with increase in pain meds  1500 patient take to CT tolerated well

## 2023-01-29 NOTE — Progress Notes (Addendum)
 PHARMACY - ANTICOAGULATION CONSULT NOTE  Pharmacy Consult for heparin  Indication: atrial fibrillation  Labs: Recent Labs    01/28/23 1302 01/28/23 2000 01/28/23 2023 01/28/23 2302 01/29/23 0456  HGB 8.6*   < > 8.8* 8.6* 8.5*  HCT 25.5*   < > 26.0* 25.0* 24.6*  PLT 204  --   --   --  203  APTT 41*  --   --   --  55*  LABPROT 35.4*  --   --   --  34.6*  INR 3.5*  --   --   --  3.4*  HEPARINUNFRC  --   --   --   --  >1.10*  CREATININE 1.84*  --   --   --   --    < > = values in this interval not displayed.   Assessment: 72yo male subtherapeutic on heparin  with initial dosing while DOAC held; no infusion issues or signs of bleeding (other than some minor oozing from IV site but noted that he's been moving the area a lot) per RN.  Goal of Therapy:  aPTT 66-102 seconds   Plan:  Increase heparin  infusion by 2 units/kg/hr to 1350 units/hr. Check PTT in 8 hours.   Marvetta Dauphin, PharmD, BCPS 01/29/2023 6:05 AM

## 2023-01-29 NOTE — Progress Notes (Signed)
 PHARMACY - PHYSICIAN COMMUNICATION CRITICAL VALUE ALERT - BLOOD CULTURE IDENTIFICATION (BCID)  Steve Anthony is an 73 y.o. male who presented to [facility] on 01/28/2023 with a chief complaint of cough and congestion.  Assessment:  Found to be febrile and hypotensive at [facility] and sent to ED, admitted for shock and AKI, started on broad-spectrum ABX for concern for sepsis, now growing ESBL Klebsiella pneumoniae in 1 of 3 blood cx bottles; of note pt has h/o same in 2021.  Name of physician (or Provider) ContactedBETHA Coder MD  Current antibiotics: vancomycin  and Zosyn  (PCN allergy noted, no reactions after two doses of Zosyn )  Changes to prescribed antibiotics recommended:  Recommendations accepted by provider -- change to meropenem .  Results for orders placed or performed during the hospital encounter of 01/28/23  Blood Culture ID Panel (Reflexed) (Collected: 01/28/2023  1:02 PM)  Result Value Ref Range   Enterococcus faecalis NOT DETECTED NOT DETECTED   Enterococcus Faecium NOT DETECTED NOT DETECTED   Listeria monocytogenes NOT DETECTED NOT DETECTED   Staphylococcus species NOT DETECTED NOT DETECTED   Staphylococcus aureus (BCID) NOT DETECTED NOT DETECTED   Staphylococcus epidermidis NOT DETECTED NOT DETECTED   Staphylococcus lugdunensis NOT DETECTED NOT DETECTED   Streptococcus species NOT DETECTED NOT DETECTED   Streptococcus agalactiae NOT DETECTED NOT DETECTED   Streptococcus pneumoniae NOT DETECTED NOT DETECTED   Streptococcus pyogenes NOT DETECTED NOT DETECTED   A.calcoaceticus-baumannii NOT DETECTED NOT DETECTED   Bacteroides fragilis NOT DETECTED NOT DETECTED   Enterobacterales DETECTED (A) NOT DETECTED   Enterobacter cloacae complex NOT DETECTED NOT DETECTED   Escherichia coli NOT DETECTED NOT DETECTED   Klebsiella aerogenes NOT DETECTED NOT DETECTED   Klebsiella oxytoca NOT DETECTED NOT DETECTED   Klebsiella pneumoniae DETECTED (A) NOT DETECTED   Proteus species NOT  DETECTED NOT DETECTED   Salmonella species NOT DETECTED NOT DETECTED   Serratia marcescens NOT DETECTED NOT DETECTED   Haemophilus influenzae NOT DETECTED NOT DETECTED   Neisseria meningitidis NOT DETECTED NOT DETECTED   Pseudomonas aeruginosa NOT DETECTED NOT DETECTED   Stenotrophomonas maltophilia NOT DETECTED NOT DETECTED   Candida albicans NOT DETECTED NOT DETECTED   Candida auris NOT DETECTED NOT DETECTED   Candida glabrata NOT DETECTED NOT DETECTED   Candida krusei NOT DETECTED NOT DETECTED   Candida parapsilosis NOT DETECTED NOT DETECTED   Candida tropicalis NOT DETECTED NOT DETECTED   Cryptococcus neoformans/gattii NOT DETECTED NOT DETECTED   CTX-M ESBL DETECTED (A) NOT DETECTED   Carbapenem resistance IMP NOT DETECTED NOT DETECTED   Carbapenem resistance KPC NOT DETECTED NOT DETECTED   Carbapenem resistance NDM NOT DETECTED NOT DETECTED   Carbapenem resist OXA 48 LIKE NOT DETECTED NOT DETECTED   Carbapenem resistance VIM NOT DETECTED NOT DETECTED    Marvetta Dauphin, PharmD, BCPS  01/29/2023  5:56 AM

## 2023-01-30 DIAGNOSIS — R579 Shock, unspecified: Secondary | ICD-10-CM | POA: Diagnosis not present

## 2023-01-30 DIAGNOSIS — R6521 Severe sepsis with septic shock: Secondary | ICD-10-CM | POA: Diagnosis not present

## 2023-01-30 DIAGNOSIS — S22020A Wedge compression fracture of second thoracic vertebra, initial encounter for closed fracture: Secondary | ICD-10-CM | POA: Diagnosis not present

## 2023-01-30 DIAGNOSIS — B961 Klebsiella pneumoniae [K. pneumoniae] as the cause of diseases classified elsewhere: Secondary | ICD-10-CM | POA: Diagnosis not present

## 2023-01-30 DIAGNOSIS — A419 Sepsis, unspecified organism: Secondary | ICD-10-CM | POA: Diagnosis not present

## 2023-01-30 LAB — GLUCOSE, CAPILLARY
Glucose-Capillary: 119 mg/dL — ABNORMAL HIGH (ref 70–99)
Glucose-Capillary: 125 mg/dL — ABNORMAL HIGH (ref 70–99)
Glucose-Capillary: 133 mg/dL — ABNORMAL HIGH (ref 70–99)
Glucose-Capillary: 137 mg/dL — ABNORMAL HIGH (ref 70–99)
Glucose-Capillary: 141 mg/dL — ABNORMAL HIGH (ref 70–99)

## 2023-01-30 LAB — COOXEMETRY PANEL
Carboxyhemoglobin: 1.4 % (ref 0.5–1.5)
Methemoglobin: 1.1 % (ref 0.0–1.5)
O2 Saturation: 80.8 %
Total hemoglobin: 8.4 g/dL — ABNORMAL LOW (ref 12.0–16.0)

## 2023-01-30 LAB — CBC
HCT: 22.8 % — ABNORMAL LOW (ref 39.0–52.0)
Hemoglobin: 8.1 g/dL — ABNORMAL LOW (ref 13.0–17.0)
MCH: 32 pg (ref 26.0–34.0)
MCHC: 35.5 g/dL (ref 30.0–36.0)
MCV: 90.1 fL (ref 80.0–100.0)
Platelets: 191 10*3/uL (ref 150–400)
RBC: 2.53 MIL/uL — ABNORMAL LOW (ref 4.22–5.81)
RDW: 22.5 % — ABNORMAL HIGH (ref 11.5–15.5)
WBC: 17.5 10*3/uL — ABNORMAL HIGH (ref 4.0–10.5)
nRBC: 0.4 % — ABNORMAL HIGH (ref 0.0–0.2)

## 2023-01-30 LAB — LACTIC ACID, PLASMA: Lactic Acid, Venous: 1.9 mmol/L (ref 0.5–1.9)

## 2023-01-30 LAB — COMPREHENSIVE METABOLIC PANEL
ALT: 16 U/L (ref 0–44)
AST: 25 U/L (ref 15–41)
Albumin: 1.7 g/dL — ABNORMAL LOW (ref 3.5–5.0)
Alkaline Phosphatase: 69 U/L (ref 38–126)
Anion gap: 19 — ABNORMAL HIGH (ref 5–15)
BUN: 9 mg/dL (ref 8–23)
CO2: 19 mmol/L — ABNORMAL LOW (ref 22–32)
Calcium: 8.1 mg/dL — ABNORMAL LOW (ref 8.9–10.3)
Chloride: 99 mmol/L (ref 98–111)
Creatinine, Ser: 1.07 mg/dL (ref 0.61–1.24)
GFR, Estimated: >60 mL/min (ref >60)
Glucose, Bld: 134 mg/dL — ABNORMAL HIGH (ref 70–99)
Potassium: 4.3 mmol/L (ref 3.5–5.1)
Sodium: 137 mmol/L (ref 135–145)
Total Bilirubin: 1.8 mg/dL — ABNORMAL HIGH (ref 0.0–1.2)
Total Protein: 3.9 g/dL — ABNORMAL LOW (ref 6.5–8.1)

## 2023-01-30 LAB — URINE CULTURE

## 2023-01-30 LAB — APTT: aPTT: 86 s — ABNORMAL HIGH (ref 24–36)

## 2023-01-30 LAB — HEPARIN LEVEL (UNFRACTIONATED): Heparin Unfractionated: >1.1 [IU]/mL — ABNORMAL HIGH (ref 0.30–0.70)

## 2023-01-30 MED ORDER — GABAPENTIN 300 MG PO CAPS
300.0000 mg | ORAL_CAPSULE | Freq: Three times a day (TID) | ORAL | Status: DC
  Administered 2023-01-30 – 2023-01-31 (×4): 300 mg via ORAL
  Filled 2023-01-30 (×4): qty 1

## 2023-01-30 MED ORDER — INFLUENZA VAC A&B SURF ANT ADJ 0.5 ML IM SUSY
0.5000 mL | PREFILLED_SYRINGE | INTRAMUSCULAR | Status: DC
  Filled 2023-01-30: qty 0.5

## 2023-01-30 MED ORDER — ONDANSETRON HCL 4 MG/2ML IJ SOLN: 4.0000 mg | Freq: Four times a day (QID) | INTRAMUSCULAR | Status: DC | PRN

## 2023-01-30 MED ORDER — FUROSEMIDE 10 MG/ML IJ SOLN
40.0000 mg | Freq: Once | INTRAMUSCULAR | Status: AC
  Administered 2023-01-30: 40 mg via INTRAVENOUS
  Filled 2023-01-30: qty 4

## 2023-01-30 MED ORDER — BACLOFEN 10 MG PO TABS
10.0000 mg | ORAL_TABLET | Freq: Three times a day (TID) | ORAL | Status: DC
  Administered 2023-01-30 – 2023-02-01 (×5): 10 mg via ORAL
  Filled 2023-01-30 (×5): qty 1

## 2023-01-30 MED ORDER — PROCHLORPERAZINE EDISYLATE 10 MG/2ML IJ SOLN
10.0000 mg | Freq: Once | INTRAMUSCULAR | Status: AC
  Administered 2023-01-30: 10 mg via INTRAVENOUS
  Filled 2023-01-30: qty 2

## 2023-01-30 MED ORDER — CARMEX CLASSIC LIP BALM EX OINT
TOPICAL_OINTMENT | CUTANEOUS | Status: DC | PRN
  Filled 2023-01-30: qty 10

## 2023-01-30 MED ORDER — PHENOL 1.4 % MT LIQD
1.0000 | OROMUCOSAL | Status: DC | PRN
  Administered 2023-01-30 – 2023-01-31 (×2): 1 via OROMUCOSAL
  Filled 2023-01-30: qty 177

## 2023-01-30 MED ORDER — SODIUM CHLORIDE 0.9 % IV SOLN
2.0000 g | Freq: Three times a day (TID) | INTRAVENOUS | Status: DC
  Administered 2023-01-30 – 2023-02-01 (×6): 2 g via INTRAVENOUS
  Filled 2023-01-30 (×9): qty 40

## 2023-01-30 NOTE — Progress Notes (Signed)
 Patient ID: Steve Anthony, male   DOB: Jun 20, 1950, 73 y.o.   MRN: [id number]     Advanced Heart Failure Rounding Note  Cardiologist: None  Chief Complaint: lightheadedness Subjective:    -This morning, resting comfortably. Levophed  coming down; now less than .  -Lactic acid wnl from 5.2 yesterday - sCr at 1.  - mixed venous 80   Objective:   Weight Range: 84.5 kg Body mass index is 25.27 kg/m.   Vital Signs:   Temp:  [97.3 F (36.3 C)-98.4 F (36.9 C)] 98.4 F (36.9 C) (01/05 1500) Pulse Rate:  [70-82] 79 (01/05 1200) Resp:  [11-30] 22 (01/05 1200) BP: (91-123)/(48-85) 111/69 (01/05 1200) SpO2:  [82 %-100 %] 93 % (01/05 1200) Arterial Line BP: (105-156)/(44-80) 130/60 (01/05 1200) Weight:  [84.5 kg] 84.5 kg (01/05 0830) Last BM Date : 01/29/23  Weight change: Filed Weights   01/29/23 0500 01/29/23 1429 01/30/23 0830  Weight: 85.6 kg 85.6 kg 84.5 kg    Intake/Output:   Intake/Output Summary (Last 24 hours) at 01/30/2023 1539 Last data filed at 01/30/2023 1300 Gross per 24 hour  Intake 1057.62 ml  Output 450 ml  Net 607.62 ml      Physical Exam    General:  resting comfortably; falling asleep duirng my exam. HEENT: Normal Neck: Supple. JVP 7-8 Cor: PMI nondisplaced. Regular rate & rhythm. No rubs, gallops or murmurs. Lungs: Clear Abdomen: Soft, nontender, nondistended. No hepatosplenomegaly. No bruits or masses. Good bowel sounds. Extremities: No cyanosis, clubbing, rash.  1+ edema to knees.  Neuro:no FND   Telemetry   NSR 70s, personally reviewed   Labs    CBC Recent Labs    01/28/23 1302 01/28/23 2000 01/29/23 0456 01/29/23 1355 01/30/23 0415  WBC 23.2*  --  23.7*  --  17.5*  NEUTROABS 21.5*  --   --   --   --   HGB 8.6*   < > 8.5*  --  8.1*  HCT 25.5*   < > 24.6*  --  22.8*  MCV 95.1  --  91.4  --  90.1  PLT 204  --  203 192 191   < > = values in this interval not displayed.   Basic Metabolic Panel Recent Labs    98/96/74 2000  01/28/23 2023 01/29/23 0456 01/30/23 0415  NA  --    < > 135 137  K  --    < > 4.4 4.3  CL  --   --  99 99  CO2  --   --  13* 19*  GLUCOSE  --   --  142* 134*  BUN  --   --  8 9  CREATININE  --   --  1.52* 1.07  CALCIUM   --   --  8.2* 8.1*  MG 1.1*  --  2.2  --   PHOS  --   --  2.9  --    < > = values in this interval not displayed.   Liver Function Tests Recent Labs    01/29/23 0456 01/30/23 0415  AST 35 25  ALT 16 16  ALKPHOS 82 69  BILITOT 2.1* 1.8*  PROT 4.3* 3.9*  ALBUMIN  1.8* 1.7*   Recent Labs    01/28/23 2000  LIPASE 17   Cardiac Enzymes No results for input(s): CKTOTAL, CKMB, CKMBINDEX, TROPONINI in the last 72 hours.  BNP: BNP (last 3 results) Recent Labs    01/13/23 1634 01/28/23 2000  BNP 721.6* 2,380.8*  ProBNP (last 3 results) No results for input(s): PROBNP in the last 8760 hours.   D-Dimer Recent Labs    01/29/23 1355  DDIMER <0.27    Imaging    CT CERVICAL SPINE WO CONTRAST Result Date: 01/29/2023 CLINICAL DATA:  Upper thoracic back pain.  Recent fall. EXAM: CT CERVICAL SPINE WITHOUT CONTRAST TECHNIQUE: Multidetector CT imaging of the cervical spine was performed without intravenous contrast. Multiplanar CT image reconstructions were also generated. RADIATION DOSE REDUCTION: This exam was performed according to the departmental dose-optimization program which includes automated exposure control, adjustment of the mA and/or kV according to patient size and/or use of iterative reconstruction technique. COMPARISON:  CT chest 01/13/2023 FINDINGS: Alignment: Trace anterolisthesis of C3 on C4. Skull base and vertebrae: No acute fracture or suspicious osseous lesion. Slight chronic anterior wedging of the C7 vertebral body, unchanged. Soft tissues and spinal canal: No prevertebral fluid or swelling. No visible canal hematoma. Disc levels: Mild cervical spondylosis. Asymmetric left-sided upper cervical facet arthrosis. No evidence of  high-grade stenosis. Upper chest: See separately reported thoracic spine CT. Other: Partially visualized right internal jugular central venous catheter. Atherosclerotic calcification at the left greater than right carotid bifurcations. IMPRESSION: No acute cervical spine fracture. Electronically Signed   By: Dasie Hamburg M.D.   On: 01/29/2023 17:18   CT THORACIC SPINE WO CONTRAST Result Date: 01/29/2023 CLINICAL DATA:  Upper thoracic back pain.  Recent fall. EXAM: CT THORACIC SPINE WITHOUT CONTRAST TECHNIQUE: Multidetector CT images of the thoracic were obtained using the standard protocol without intravenous contrast. RADIATION DOSE REDUCTION: This exam was performed according to the departmental dose-optimization program which includes automated exposure control, adjustment of the mA and/or kV according to patient size and/or use of iterative reconstruction technique. COMPARISON:  CT chest 01/28/2023 and 01/13/2023 FINDINGS: Alignment: Normal. Vertebrae: Mild T2 superior endplate compression fracture with 20% vertebral body height loss which reflects a change from 01/13/2023 and has an acute appearance. Unchanged mild chronic T7 compression fracture. No suspicious bone lesion. Paraspinal and other soft tissues: Emphysema. Small bilateral pleural effusions. Partially visualized patchy bilateral lung opacities which appear increased from yesterday's CT in the posterior right upper lobe and new in the left upper lobe. Aortic and coronary atherosclerosis. Cholecystectomy. Disc levels: Spondylosis with prominent bridging anterior vertebral osteophytes throughout the mid and lower thoracic spine. No evidence of high-grade spinal stenosis. IMPRESSION: 1. Acute mild T2 compression fracture. 2. Chronic T7 compression fracture. 3. Partially visualized progressive patchy bilateral lung opacities concerning for pneumonia. 4. Small bilateral pleural effusions. 5.  Aortic Atherosclerosis (ICD10-I70.0). Electronically Signed    By: Dasie Hamburg M.D.   On: 01/29/2023 17:14     Medications:     Scheduled Medications:  Chlorhexidine  Gluconate Cloth  6 each Topical Daily   feeding supplement  237 mL Oral TID BM   gabapentin   100 mg Oral TID   guaiFENesin   600 mg Oral BID   [START ON 01/31/2023] influenza vaccine adjuvanted  0.5 mL Intramuscular Tomorrow-1000   lidocaine   1 patch Transdermal Q24H   mometasone -formoterol   2 puff Inhalation BID   pantoprazole  (PROTONIX ) IV  40 mg Intravenous Q24H   revefenacin   175 mcg Nebulization Daily   tamsulosin   0.4 mg Oral Daily    Infusions:  DOBUTamine  4 mcg/kg/min (01/30/23 1100)   heparin  1,350 Units/hr (01/30/23 1100)   meropenem  (MERREM ) IV     norepinephrine  (LEVOPHED ) Adult infusion 5 mcg/min (01/30/23 1100)    PRN Medications: acetaminophen , albuterol , benzonatate , docusate sodium , fentaNYL  (  SUBLIMAZE ) injection, lip balm, ondansetron  (ZOFRAN ) IV, oxyCODONE , polyethylene glycol    Assessment/Plan   1. Shock: Suspect mixed septic/cardiogenic shock (primarily septic).  WBCs 23, PCT 3.53.  Klebsiella in blood cultures.   Echo showed EF 60-65%, moderately dilated and moderately dysfunctional RV, dilated IVC.  This looks like primarily RV failure.   He has been on Eliquis , so PE less likely though not ruled out by the non-PE protocol chest CT.  - Lactic acid now within normal limits; mixed venous 80% with low CVPs.  - Decrease dobutamine  to 2.5mcg/kg/min.  - continue to wean levophed  as able with goal MAP 65.   2. RV failure: As above, echo showed EF 60-65%, moderately dilated and moderately dysfunctional RV, dilated IVC.  Primarily RV failure.  LV EF appears improved s/p AV nodal ablation and CRT-P placement in 12/24 (had been 35-40% on prior echo).    - Decrease dobutamine  to 2.5mcg/k/gmin.   3. Atrial fibrillation: Permanent, s/p AV nodal ablation and CRT-P.  - continue hep gtt.   4. H/o GI bleeding/anemia - Hgb stable at 8.1 today.   5. ID:  -CT  chest/abd/pelvis with colitis noted.  Blood cultures growing Klebsiella, respiratory virus panel with rhinovirus.  - Patient has new CRT-P device. Klebsiella is only rare cause of endocarditis.  - WBC ct down to 17.5 today from 23.7 yesterday.  - continue meropenem ; CCM following. Can likely D/C vancomycin .   6. Colitis: Noted on CT abdomen/pelvis.  ?Ischemic colitis with hypotension.  Abdomen is not tender on exam.   7. AKI on CKD stage 3 - sCr now within normal limits at 1.07 from 1.5 yesterday.   8. Chronic pancreatitis: Noted again on CT.   9. Back pain: Patient has chronic low back pain, on oxycodone  at home.  Now on Fentanyl  boluses.  Worsening pain while lying in bed and agitated.  - continue Fentanyl  to 50 mcg per dose.    CRITICAL CARE Performed by: Ria Commander   Total critical care time: 35 minutes  Critical care time was exclusive of separately billable procedures and treating other patients.  Critical care was necessary to treat or prevent imminent or life-threatening deterioration.  Critical care was time spent personally by me on the following activities: development of treatment plan with patient and/or surrogate as well as nursing, discussions with consultants, evaluation of patient's response to treatment, examination of patient, obtaining history from patient or surrogate, ordering and performing treatments and interventions, ordering and review of laboratory studies, ordering and review of radiographic studies, pulse oximetry and re-evaluation of patient's condition.   Length of Stay: 2  Steve Payeur, DO  01/30/2023, 3:39 PM  Advanced Heart Failure Team Pager [phone] (M-F; 7a - 5p)  Please contact [facility] for night-coverage after hours (5p -7a ) and weekends on [email or web address]

## 2023-01-30 NOTE — Progress Notes (Signed)
 PHARMACY - ANTICOAGULATION CONSULT NOTE  Pharmacy Consult for heparin  Indication: atrial fibrillation  Allergies  Allergen Reactions   Penicillins Hives and Shortness Of Breath    Tolerated two doses of Zosyn  Jan 2025   Levaquin  [Levofloxacin ] Rash and Other (See Comments)    Alters mood    Patient Measurements: Height: 6' (182.9 cm) Weight: 84.5 kg (186 lb 4.6 oz) IBW/kg (Calculated) : 77.6 Heparin  Dosing Weight: 87.3 kg    Vital Signs: Temp: 98.1 F (36.7 C) (01/05 1100) Temp Source: Axillary (01/05 1100) BP: 111/69 (01/05 1200) Pulse Rate: 79 (01/05 1200)  Labs: Recent Labs    01/28/23 1302 01/28/23 2000 01/28/23 2302 01/29/23 0456 01/29/23 1355 01/29/23 1523 01/30/23 0415  HGB 8.6*   < > 8.6* 8.5*  --   --  8.1*  HCT 25.5*   < > 25.0* 24.6*  --   --  22.8*  PLT 204  --   --  203 192  --  191  APTT 41*  --   --  55* 83* 93* 86*  LABPROT 35.4*  --   --  34.6* 34.9*  --   --   INR 3.5*  --   --  3.4* 3.4*  --   --   HEPARINUNFRC  --   --   --  >1.10*  --   --  >1.10*  CREATININE 1.84*  --   --  1.52*  --   --  1.07   < > = values in this interval not displayed.    Estimated Creatinine Clearance: 69.5 mL/min (by C-G formula based on SCr of 1.07 mg/dL).   Medical History: Past Medical History:  Diagnosis Date   Abnormal CT of the abdomen 05/27/2018   Abnormal MRI of abdomen 04/14/2018   Acute pancreatitis 02/20/2019   Anemia    Anxiety disorder 04/09/2015   Arthritis    Asthma    Atrial fibrillation (HCC) 11/21/2008   Qualifier: Diagnosis of   By: Waddell, MD, CODY Danelle Fallow        CAD (coronary artery disease)    a. S/P PCI mid LAD (vision stent) 08/31/2004;  b. repeat cath 6/11 - no progression of CAD. c. Cath 10/2012 given moderate CAD on cardiac CT - no change from prior cath.   Centrilobular emphysema (HCC) 10/08/2022   Chest CT 08/04/2022     Chest pain 12/16/2016   CHEST PAIN UNSPECIFIED 11/21/2008   Qualifier: Diagnosis of   By: Ardelia,  EMT-P, Tara         CHF (congestive heart failure) (HCC), reduced LVEF 35 to 40% by TTE 11/19/2022, new decrease compared to prior echocardiogram 2022.  Likely tachycardia mediated in the setting of A-fib RVR 12/17/2022   Chronic respiratory failure with hypoxia (HCC) 02/24/2017   Chronic systolic CHF (congestive heart failure) (HCC) 01/14/2023   Cirrhosis of liver without ascites (HCC) 04/14/2018   CKD (chronic kidney disease), stage III (HCC) 02/20/2019   COPD (chronic obstructive pulmonary disease) (HCC)    COPD without exacerbation (HCC) 05/15/2009   Qualifier: Diagnosis of   By: Jude MD, Harden GAILS.         COVID-19 virus infection    Cyst of nipple, right 11/03/2020   Degeneration of lumbar intervertebral disc 04/09/2015   Diverticulitis    Dyslipidemia    Essential hypertension 10/15/2008   Qualifier: Diagnosis of   By: Rolan Hough         Gallstones    GERD 10/15/2008  Qualifier: Diagnosis of   By: Rolan Hough         High risk medication use 04/09/2015   History of COVID-19 01/24/19 02/20/2019   History of kidney stones    History of pelvic fracture 01/01/2020   History of tattoo 08/23/2016   History of UTI 11/28/2019   Hypercoagulable state due to persistent atrial fibrillation (HCC) 03/26/2022   Hyperlipidemia 10/15/2008   Qualifier: Diagnosis of   By: Rolan Hough         Hypertension    Hyperthyroidism    HYPERTRIGLYCERIDEMIA 10/15/2008   Qualifier: Diagnosis of   By: Rolan Hough         Hypotension 06/13/2019   Idiopathic acute pancreatitis 04/14/2018   Incisional hernia, without obstruction or gangrene 02/05/2016   Infected pancreatic pseudocyst 06/07/2019   Insomnia 04/09/2015   Iron deficiency anemia 05/03/2022   Kidney mass 07/06/2017   Following with urology. Chao.  Recommend follow up. Last seen 01/2018  Refuses to see again.   CT okay 04/2021     Long term (current) use of anticoagulants [Z79.01] 06/10/2015   Melanoma (HCC)    melanoma removed  from neck    Mild episode of recurrent major depressive disorder (HCC) 08/27/2015   NEPHROLITHIASIS 10/15/2008   Qualifier: Diagnosis of   By: Rolan Hough         OA (osteoarthritis) of hip 12/18/2014   On home oxygen therapy    2L q hs (11/08/2012),  06/04/19 pt. not on oxygen   OSA (obstructive sleep apnea)    a. cpap noncompliance- patient reports on 12/10/14- does not use CPAP   Palpitations    a. 10/2012 - s/p LINQ loop recorder to assess for arrhythmia.    Pancreatic cyst 04/14/2018   Pancreatic pseudocyst 06/07/2019   Pancreatitis    Paroxysmal A-fib (HCC) 11/21/2008   a. S/P PVI 2/11 and 9/11   Paroxysmal atrial fibrillation (HCC) 01/14/2023   Pelvic fracture (HCC) 12/31/2019   Persistent atrial fibrillation (HCC) 06/08/2022   PICC (peripherally inserted central catheter) in place    Pneumonia    Protein-calorie malnutrition, severe 06/12/2019   Pseudocyst of pancreas 03/06/2019   Pure hypercholesterolemia    S/P coronary artery stent placement    SHORTNESS OF BREATH 03/19/2009   Qualifier: Diagnosis of   By: Kelsie MD, James         Sleep apnea 05/17/2009   Qualifier: Diagnosis of   By: Jude MD, Harden GAILS.         SVT (supraventricular tachycardia) (HCC)    a. Atypical AVNRT of the slow pathway   Thoracic aortic aneurysm (HCC) 11/13/2020   Type II diabetes mellitus (HCC)    Unstable angina (HCC) 10/15/2008   Qualifier: Diagnosis of   By: Rolan Hough          Medications:  Scheduled:   Chlorhexidine  Gluconate Cloth  6 each Topical Daily   feeding supplement  237 mL Oral TID BM   gabapentin   100 mg Oral TID   guaiFENesin   600 mg Oral BID   lidocaine   1 patch Transdermal Q24H   mometasone -formoterol   2 puff Inhalation BID   pantoprazole  (PROTONIX ) IV  40 mg Intravenous Q24H   revefenacin   175 mcg Nebulization Daily   tamsulosin   0.4 mg Oral Daily   Infusions:   DOBUTamine  4 mcg/kg/min (01/30/23 1100)   heparin  1,350 Units/hr (01/30/23 1100)   meropenem   (MERREM ) IV     norepinephrine  (LEVOPHED ) Adult infusion 5 mcg/min (01/30/23  1100)   PRN: acetaminophen , albuterol , benzonatate , docusate sodium , fentaNYL  (SUBLIMAZE ) injection, lip balm, ondansetron  (ZOFRAN ) IV, oxyCODONE , polyethylene glycol  Assessment: Patient admitted for cardiogenic vs. Septic shock. On Eliquis  PTA for afib.  Last dose was 1/2 at 17:30. Will need heparin  levels and aPTT until they are correlating.   CBC fairly stable, no overt bleeding or complications noted.    APTT within goal range this AM  Goal of Therapy:  Heparin  level 0.3-0.7 units/ml aPTT 66-102 seconds Monitor platelets by anticoagulation protocol: Yes   Plan:  Continue IV Heparin  at 1350 units/hr. Daily heparin  level and CBC  Harlene Barlow, [employer], BCCP Clinical Pharmacist  01/30/2023 2:59 PM   [facility] pharmacy phone numbers are listed on [email or web address]

## 2023-01-30 NOTE — Progress Notes (Signed)
 Pharmacy Antibiotic Note  Steve Anthony is a 73 y.o. male admitted on 01/28/2023 with increasing generalized weakness over 2 weeks, poor PO intake, and cough found to be hypotensive with SBP in 60's, 89% on room air. Past medical history of COPD, OSA, HFrEF, CAD, HTN, PAF on eliquis , sick sinus syndrome s/p PPM 12/27/22, CKD, DMT2, GERD, cirrhosis, hyperthyroidism, pancreatitis, and IDA. Antibiotics started for shock (? Septic +/- cardiogenic). Pharmacy has been consulted for vancomycin /Zosyn  dosing.  Lactic acid 9.5 > 6.9. WBC 23.7. BP was 56/39 >> improved to 102/67 (MAP 77) on vasopressors. Afebrile. Scr 1.84 > 1.52 (B/L ~ 1.1-1.2). Was given 1750 mg vancomycin  and 3.375 g Zosyn  in ED.   Cultures growing klebsiella yesterday so antibiotics narrowed to meropenem .  Scr improving today.  Plan: Increase meropenem  to 2g q 8 hrs for improved renal function.  Height: 6' (182.9 cm) Weight: 84.5 kg (186 lb 4.6 oz) IBW/kg (Calculated) : 77.6  Temp (24hrs), Avg:97.6 F (36.4 C), Min:97.3 F (36.3 C), Max:98.1 F (36.7 C)  Recent Labs  Lab 01/28/23 1302 01/28/23 1317 01/28/23 1453 01/28/23 2000 01/29/23 0456 01/29/23 1355 01/30/23 0415  WBC 23.2*  --   --   --  23.7*  --  17.5*  CREATININE 1.84*  --   --   --  1.52*  --  1.07  LATICACIDVEN  --    < > 9.5* 8.2* 6.9* 5.2* 1.9  VANCORANDOM  --   --   --   --  12  --   --    < > = values in this interval not displayed.    Estimated Creatinine Clearance: 69.5 mL/min (by C-G formula based on SCr of 1.07 mg/dL).    Allergies  Allergen Reactions   Penicillins Hives and Shortness Of Breath    Tolerated two doses of Zosyn  Jan 2025   Levaquin  [Levofloxacin ] Rash and Other (See Comments)    Alters mood    Antimicrobials this admission:  Zosyn  1/3 > 1/4 Vancomycin  1/3 > 1/4 Meropenem  1/4 >   Dose adjustments this admission:  1/4 VR = 12 drawn ~ 12 hrs after load   Microbiology results:  1/3 BCx - ESBL Kleb in 1/3 bottles 1/3 Resp  virus + Rhinovirus/enterovirus 1/3 UCx >  1/3 MRSA - neg  Thank you for allowing pharmacy to be a part of this patient's care.   Harlene Barlow, [employer], BCCP Clinical Pharmacist  01/30/2023 2:44 PM   [facility] pharmacy phone numbers are listed on [email or web address]

## 2023-01-30 NOTE — Plan of Care (Signed)
  Problem: Education: Goal: Knowledge of General Education information will improve Description: Including pain rating scale, medication(s)/side effects and non-pharmacologic comfort measures Outcome: Not Progressing   Problem: Health Behavior/Discharge Planning: Goal: Ability to manage health-related needs will improve Outcome: Not Progressing   Problem: Clinical Measurements: Goal: Ability to maintain clinical measurements within normal limits will improve Outcome: Not Progressing Goal: Will remain free from infection Outcome: Not Progressing Goal: Diagnostic test results will improve Outcome: Not Progressing Goal: Respiratory complications will improve Outcome: Not Progressing Goal: Cardiovascular complication will be avoided Outcome: Not Progressing   Problem: Activity: Goal: Risk for activity intolerance will decrease Outcome: Not Progressing   Problem: Nutrition: Goal: Adequate nutrition will be maintained Outcome: Not Progressing   Problem: Coping: Goal: Level of anxiety will decrease Outcome: Not Progressing   Problem: Elimination: Goal: Will not experience complications related to bowel motility Outcome: Not Progressing Goal: Will not experience complications related to urinary retention Outcome: Not Progressing   Problem: Pain Management: Goal: General experience of comfort will improve Outcome: Not Progressing   Problem: Safety: Goal: Ability to remain free from injury will improve Outcome: Not Progressing   Problem: Skin Integrity: Goal: Risk for impaired skin integrity will decrease Outcome: Not Progressing   Problem: Education: Goal: Ability to describe self-care measures that may prevent or decrease complications (Diabetes Survival Skills Education) will improve Outcome: Not Progressing Goal: Individualized Educational Video(s) Outcome: Not Progressing   Problem: Coping: Goal: Ability to adjust to condition or change in health will  improve Outcome: Not Progressing   Problem: Fluid Volume: Goal: Ability to maintain a balanced intake and output will improve Outcome: Not Progressing   Problem: Health Behavior/Discharge Planning: Goal: Ability to identify and utilize available resources and services will improve Outcome: Not Progressing Goal: Ability to manage health-related needs will improve Outcome: Not Progressing   Problem: Metabolic: Goal: Ability to maintain appropriate glucose levels will improve Outcome: Not Progressing   Problem: Nutritional: Goal: Maintenance of adequate nutrition will improve Outcome: Not Progressing Goal: Progress toward achieving an optimal weight will improve Outcome: Not Progressing   Problem: Skin Integrity: Goal: Risk for impaired skin integrity will decrease Outcome: Not Progressing   Problem: Tissue Perfusion: Goal: Adequacy of tissue perfusion will improve Outcome: Not Progressing   Problem: Fluid Volume: Goal: Hemodynamic stability will improve Outcome: Not Progressing   Problem: Clinical Measurements: Goal: Diagnostic test results will improve Outcome: Not Progressing Goal: Signs and symptoms of infection will decrease Outcome: Not Progressing   Problem: Respiratory: Goal: Ability to maintain adequate ventilation will improve Outcome: Not Progressing

## 2023-01-30 NOTE — Progress Notes (Signed)
 NAME:  Steve Anthony, MRN:  [id number], DOB:  Oct 20, 1950, LOS: 2 ADMISSION DATE:  01/28/2023, CONSULTATION DATE:  01/27/22 REFERRING MD:  Dr. Mannie, CHIEF COMPLAINT:  hypotension   History of Present Illness:   72 yoM with PMH significant for but not limited to COPD, OSA, HFrEF, CAD, HTN, PAF on eliquis , sick sinus syndrome s/p PPM 12/27/22, CKD, DMT2, GERD, cirrhosis, hyperthyroidism, pancreatitis, chronic back pain, melanoma, and IDA who initially presented to urgent care today with complaints of increasing generalized weakness over 2 weeks, poor PO intake, and cough found to be hypotensive with SBP in 60's, 89% on room air.  Daughter at bedside who is an ER nurse and able to provide more history.  Reports pt has had progressive decline over several weeks.  Had staph infection in right forearm after a skin biopsy treated in November, then had PPM and ablation with EP on 12/2 for sick sinus and prior failed treatment for afib.  Was admitted 12/19 with dehydration and poor PO intake and ongoing low BP's in outpt setting found to have elevated lactic thought possibly to home jardiance  and metformin .  Taken off lopressor , jardiance  in December.  BP improved and he was discharged home next day to be able to see his mother prior to her passing away.  Since, pt continues to have poor PO intake and reports nothing taste good.  Reported 67 lb unintentional wt loss since June 2024.  Since his discharge, pt also has had dizzy spells and multiple falls at home, last on 12/31 in which he was evaluated at [facility] ER and discharged home.  Sleeps a lot per daughter.  Progressive BLE swelling despite taking lasix  at home.  Ongoing cough but now productive yellow over last week but denies any SOB.  Denies any pain, fever, N/V/D, chest/ abdominal pain, or hematemesis, hemoptysis, or bloody stools.  Reports some dysuria.  Last took meds, including eliquis  01/26/22 PM.  In ER, temp 99., initial SBP in 60's, and 89% on room air.   Given 2L LR and placed on peripheral NE for ongoing hypotension.      Labs noted for WBC 23, lactic 8.8 rising to 9.5 despite fluids, Hgb 8.6 (down from previous 11.1 on 12/20).  CTH and CT chest/ abd/ pelvis ordered and pending.  CXR showed mild vascular congestion and possible LLL retrocardiac opacity.  Bedside pocus echo per EDP concerning for poor EF, possibly 25%.  Pt has not required any supplemental O2 in ER, maintaining on room air.  PCCM called for admit.   Pertinent  Medical History   Past Medical History:  Diagnosis Date   Abnormal CT of the abdomen 05/27/2018   Abnormal MRI of abdomen 04/14/2018   Acute pancreatitis 02/20/2019   Anemia    Anxiety disorder 04/09/2015   Arthritis    Asthma    Atrial fibrillation (HCC) 11/21/2008   Qualifier: Diagnosis of   By: Waddell, MD, CODY Danelle Fallow        CAD (coronary artery disease)    a. S/P PCI mid LAD (vision stent) 08/31/2004;  b. repeat cath 6/11 - no progression of CAD. c. Cath 10/2012 given moderate CAD on cardiac CT - no change from prior cath.   Centrilobular emphysema (HCC) 10/08/2022   Chest CT 08/04/2022     Chest pain 12/16/2016   CHEST PAIN UNSPECIFIED 11/21/2008   Qualifier: Diagnosis of   By: Ardelia EMT-P, Tara         CHF (congestive heart failure) (  HCC), reduced LVEF 35 to 40% by TTE 11/19/2022, new decrease compared to prior echocardiogram 2022.  Likely tachycardia mediated in the setting of A-fib RVR 12/17/2022   Chronic respiratory failure with hypoxia (HCC) 02/24/2017   Chronic systolic CHF (congestive heart failure) (HCC) 01/14/2023   Cirrhosis of liver without ascites (HCC) 04/14/2018   CKD (chronic kidney disease), stage III (HCC) 02/20/2019   COPD (chronic obstructive pulmonary disease) (HCC)    COPD without exacerbation (HCC) 05/15/2009   Qualifier: Diagnosis of   By: Jude MD, Harden GAILS.         COVID-19 virus infection    Cyst of nipple, right 11/03/2020   Degeneration of lumbar intervertebral disc  04/09/2015   Diverticulitis    Dyslipidemia    Essential hypertension 10/15/2008   Qualifier: Diagnosis of   By: Rolan Hough         Gallstones    GERD 10/15/2008   Qualifier: Diagnosis of   By: Rolan Hough         High risk medication use 04/09/2015   History of COVID-19 01/24/19 02/20/2019   History of kidney stones    History of pelvic fracture 01/01/2020   History of tattoo 08/23/2016   History of UTI 11/28/2019   Hypercoagulable state due to persistent atrial fibrillation (HCC) 03/26/2022   Hyperlipidemia 10/15/2008   Qualifier: Diagnosis of   By: Rolan Hough         Hypertension    Hyperthyroidism    HYPERTRIGLYCERIDEMIA 10/15/2008   Qualifier: Diagnosis of   By: Rolan Hough         Hypotension 06/13/2019   Idiopathic acute pancreatitis 04/14/2018   Incisional hernia, without obstruction or gangrene 02/05/2016   Infected pancreatic pseudocyst 06/07/2019   Insomnia 04/09/2015   Iron deficiency anemia 05/03/2022   Kidney mass 07/06/2017   Following with urology. Chao.  Recommend follow up. Last seen 01/2018  Refuses to see again.   CT okay 04/2021     Long term (current) use of anticoagulants [Z79.01] 06/10/2015   Melanoma (HCC)    melanoma removed from neck    Mild episode of recurrent major depressive disorder (HCC) 08/27/2015   NEPHROLITHIASIS 10/15/2008   Qualifier: Diagnosis of   By: Rolan Hough         OA (osteoarthritis) of hip 12/18/2014   On home oxygen therapy    2L q hs (11/08/2012),  06/04/19 pt. not on oxygen   OSA (obstructive sleep apnea)    a. cpap noncompliance- patient reports on 12/10/14- does not use CPAP   Palpitations    a. 10/2012 - s/p LINQ loop recorder to assess for arrhythmia.    Pancreatic cyst 04/14/2018   Pancreatic pseudocyst 06/07/2019   Pancreatitis    Paroxysmal A-fib (HCC) 11/21/2008   a. S/P PVI 2/11 and 9/11   Paroxysmal atrial fibrillation (HCC) 01/14/2023   Pelvic fracture (HCC) 12/31/2019   Persistent atrial  fibrillation (HCC) 06/08/2022   PICC (peripherally inserted central catheter) in place    Pneumonia    Protein-calorie malnutrition, severe 06/12/2019   Pseudocyst of pancreas 03/06/2019   Pure hypercholesterolemia    S/P coronary artery stent placement    SHORTNESS OF BREATH 03/19/2009   Qualifier: Diagnosis of   By: Kelsie MD, James         Sleep apnea 05/17/2009   Qualifier: Diagnosis of   By: Jude MD, Harden GAILS.         SVT (supraventricular tachycardia) (HCC)    a. Atypical  AVNRT of the slow pathway   Thoracic aortic aneurysm (HCC) 11/13/2020   Type II diabetes mellitus (HCC)    Unstable angina (HCC) 10/15/2008   Qualifier: Diagnosis of   By: Rolan Hough         [facility] Events: Including procedures, antibiotic start and stop dates in addition to other pertinent events   1/3 Admitted, antibiotics started,   Interim History / Subjective:  Nausea this morning.  Still having pain.  1 loose bowel movement but no diarrhea today.  Objective   Blood pressure (!) 91/48, pulse 75, temperature (!) 97.3 F (36.3 C), temperature source Oral, resp. rate 11, height 6' (1.829 m), weight 84.5 kg, SpO2 (!) 89%. CVP:  [6 mmHg-23 mmHg] 14 mmHg      Intake/Output Summary (Last 24 hours) at 01/30/2023 0940 Last data filed at 01/30/2023 0800 Gross per 24 hour  Intake 1260.34 ml  Output 650 ml  Net 610.34 ml   Filed Weights   01/29/23 0500 01/29/23 1429 01/30/23 0830  Weight: 85.6 kg 85.6 kg 84.5 kg    Examination: General: Chronically ill-appearing, frail elderly man lying in bed no acute distress HEENT: [place]/AT, eyes anicteric.  RIJ CVC with bruising. neuro: Sleeping but arouses to verbal stimulation.  Able to wake up and answer questions.  Globally weak but moving extremities. CV: S1-S2, intermittently paced, irregular rhythm. Sinus rhythm with occasional paced beats. PULM: Breathing comfortably on nasal cannula, less coughing today.  Faint rales bilaterally. GI: Soft,  nontender Skin: Still has significant bruising on arms, legs, chest wall.  No diffuse rashes Skin: Minimal edema  Bicarb 19 BUN 9 Creatinine 1.07 Bilirubin 1.8 with normal transaminases.   Lactic acid 1.9 WBC 17.5  H/H 8.1/22.8 Platelets 191 cooximetry 81%  Blood cultures-Klebsiella pneumoniae Respiratory culture- pending Repeat blood cultures NGTD  UA 21-50 WBC Urine culture multiple species  Resolved Hospital Problem list    Assessment & Plan:   Shock- appears septic due to GNR bacteremia, possibly a component of chronic heart failure causing cardiogenic shock. K. Pneumoniae bacteremia. Daughter said he felt like he has had a UTI for a week and he has progressive lung infiltrates on CT today. -Repeat blood cultures pending given recent pacemaker implantation in December 2024; so far no growth to date - Meropenem  for resistant Klebsiella. - Vasopressors as required to maintain MAP greater than 65 - Serial cooximetery  AoC HFrEF and RV failure  Sick sinus syndrome s/p PPM 12/27/22 Afib s/p ablation - looks either junction vs apaced at times.  Rate regular -Wean off dobutamine ; down to 2.4mcg.  Appreciate heart failure team's management.  Rhinovirus infection - Droplet precautions -muxinex BID, tessalon  PRN  AGMA/ lactic acidosis, previously on metformin , which may contribute to slow clearance - Recheck lactic acid is reassuring Recommend against rechallenging with metformin  due to concern that this may have explained his slowed clearance.  AKI, improved H/o urinary retention - Direct I/O - Renally dose meds and avoid nephrotoxic meds.  Okay to start back on PTA baclofen  and increase dose of gabapentin . - Renally dose meds.  Avoid nephrotoxic meds. - Discontinue Foley - Maintain adequate renal perfusion - Can continue PTA tamsulosin .  Acute normocytic anemia/ hx IDA> suspect the acute drop in his hemoglobin is due to significant bruising after multiple recent  falls. Coagulopathy/ elevated INR (on eliquis ) - Monitor CBC, so far not needing transfusions - Heparin , holding Eliquis  - Transfuse for hemoglobin less than 7 or hemodynamically significant bleeding -Once bacteremia resolves needs  iron repletion.  Recent falls and back pain> new T2 compression fracture. Old compression fracture T7 seen on CT Chronic LBP, multiple previous surgeries --Continue PTA oxycodone  as needed - Resume PTA baclofen  - Increase back to PTA gabapentin  dose. - Lidocaine  patch and Tylenol  as needed -Daughter shares concern of excessive sedation with increased use of opiates.  DMT2, hyperglycemia- controlled without insulin . Prior A1c 6.2 on 12/27/22, -Accu-Checks, SSI as needed -Goal blood glucose less than 180 -Recommend against restarting metformin .  Taken off jardiance  in December; was supposed to stop metformin  concurrently but was confused and did not do this.  GERD -Daily PPI  Hyperbilirubinemia likely due to sepsis, no evidence of biliary disease.  Improving. -maintain adequate perfusion - Treat underlying heart failure and sepsis  Severe protein calorie malnutrition, nearly 70 lb unintentional wt loss reported since 06/2022 Severe deconditioning, ?FTT, frequent falls -Encourage p.o. intake and Ensure 3 times daily - RD consulted.  Suspect he may need a core track tomorrow. - Calorie count ordered -Suspect his Jardiance  may have had some to do with his weight loss.  Acute respiratory failure with hypoxia, acute pneumonia- viral vs bacterial.  Increasing oxygen requirements. Hx COPD, OSA (non compliant w/ CPAP x 62yrs per pt) -meropenem  -Yupelri , Dulera  - respiratory culture pending  -Titrate supplemental oxygen as required to maintain SpO2 greater than 90% - Pulmonary hygiene -Lasix  today  Vision loss PTA; hasn't been able to follow up with ophtho, family denies glaucoma, macular degeneration, diabetic retinopathy history.  Home blood sugars have  been less than 200. - Needs outpatient follow-up with ophthalmology. This will be a hard issue to fully assess and resolve as an IP.  Diarrhea-resolved - Can come off enteric precautions  GOC - At admission pt verbalized wishes of DNR/ DNI but wishes for all other aggressive medical care and interventions including CVL/ pressors.    Wife updated at bedside with daughter on speaker phone.  Long-term prognosis still guarded with progressive decline over the last few months.  Best Practice (right click and Reselect all SmartList Selections daily)   Diet/type: Regular consistency (see orders) DVT prophylaxis systemic heparin  Pressure ulcer(s): N/A> skin tear to R forearm POA GI prophylaxis: PPI Lines: Central line and Arterial Line Foley:  removal ordered  Code Status:  DNR/ DNI per pt's expressed wishes, daughter present- ok with CVL/ pressors, other aggressive medical care Last date of multidisciplinary goals of care discussion [1/4]  Labs   CBC: Recent Labs  Lab 01/28/23 1302 01/28/23 2000 01/28/23 2023 01/28/23 2302 01/29/23 0456 01/29/23 1355 01/30/23 0415  WBC 23.2*  --   --   --  23.7*  --  17.5*  NEUTROABS 21.5*  --   --   --   --   --   --   HGB 8.6* 8.6* 8.8* 8.6* 8.5*  --  8.1*  HCT 25.5* 24.9* 26.0* 25.0* 24.6*  --  22.8*  MCV 95.1  --   --   --  91.4  --  90.1  PLT 204  --   --   --  203 192 191    Basic Metabolic Panel: Recent Labs  Lab 01/28/23 1302 01/28/23 2000 01/28/23 2023 01/29/23 0456 01/30/23 0415  NA 137  --  132* 135 137  K 4.2  --  4.6 4.4 4.3  CL 96*  --   --  99 99  CO2 12*  --   --  13* 19*  GLUCOSE 96  --   --  142* 134*  BUN 8  --   --  8 9  CREATININE 1.84*  --   --  1.52* 1.07  CALCIUM  7.6*  --   --  8.2* 8.1*  MG  --  1.1*  --  2.2  --   PHOS  --   --   --  2.9  --    GFR: Estimated Creatinine Clearance: 69.5 mL/min (by C-G formula based on SCr of 1.07 mg/dL). Recent Labs  Lab 01/28/23 1302 01/28/23 1317 01/28/23 2000  01/29/23 0456 01/29/23 1355 01/30/23 0415  PROCALCITON  --   --  4.79 3.53  --   --   WBC 23.2*  --   --  23.7*  --  17.5*  LATICACIDVEN  --    < > 8.2* 6.9* 5.2* 1.9   < > = values in this interval not displayed.    Liver Function Tests: Recent Labs  Lab 01/28/23 1302 01/29/23 0456 01/30/23 0415  AST 33 35 25  ALT 15 16 16   ALKPHOS 74 82 69  BILITOT 1.7* 2.1* 1.8*  PROT 3.9* 4.3* 3.9*  ALBUMIN  1.7* 1.8* 1.7*   Recent Labs  Lab 01/28/23 2000  LIPASE 17   No results for input(s): AMMONIA in the last 168 hours.  ABG    Component Value Date/Time   PHART 7.353 01/28/2023 2023   PCO2ART 19.2 (LL) 01/28/2023 2023   PO2ART 68 (L) 01/28/2023 2023   HCO3 10.7 (L) 01/28/2023 2023   TCO2 11 (L) 01/28/2023 2023   ACIDBASEDEF 13.0 (H) 01/28/2023 2023   O2SAT 60.4 01/29/2023 0456     Coagulation Profile: Recent Labs  Lab 01/28/23 1302 01/29/23 0456 01/29/23 1355  INR 3.5* 3.4* 3.4*    Critical care time:       This patient is critically ill with multiple organ system failure which requires frequent high complexity decision making, assessment, support, evaluation, and titration of therapies. This was completed through the application of advanced monitoring technologies and extensive interpretation of multiple databases. During this encounter critical care time was devoted to patient care services described in this note for 38 minutes.  Leita SHAUNNA Gaskins, DO 01/30/23 5:29 PM [facility]  For contact information, see Amion. If no response to pager, please call PCCM consult pager. After hours, 7PM- 7AM, please call Elink.

## 2023-01-31 ENCOUNTER — Inpatient Hospital Stay (HOSPITAL_COMMUNITY): Payer: Medicare PPO

## 2023-01-31 DIAGNOSIS — R579 Shock, unspecified: Secondary | ICD-10-CM | POA: Diagnosis not present

## 2023-01-31 DIAGNOSIS — B961 Klebsiella pneumoniae [K. pneumoniae] as the cause of diseases classified elsewhere: Secondary | ICD-10-CM | POA: Diagnosis not present

## 2023-01-31 DIAGNOSIS — A419 Sepsis, unspecified organism: Secondary | ICD-10-CM | POA: Diagnosis not present

## 2023-01-31 DIAGNOSIS — S22020A Wedge compression fracture of second thoracic vertebra, initial encounter for closed fracture: Secondary | ICD-10-CM | POA: Diagnosis not present

## 2023-01-31 DIAGNOSIS — R6521 Severe sepsis with septic shock: Secondary | ICD-10-CM | POA: Diagnosis not present

## 2023-01-31 LAB — BASIC METABOLIC PANEL
Anion gap: 18 — ABNORMAL HIGH (ref 5–15)
BUN: 13 mg/dL (ref 8–23)
CO2: 20 mmol/L — ABNORMAL LOW (ref 22–32)
Calcium: 8.1 mg/dL — ABNORMAL LOW (ref 8.9–10.3)
Chloride: 96 mmol/L — ABNORMAL LOW (ref 98–111)
Creatinine, Ser: 1.03 mg/dL (ref 0.61–1.24)
GFR, Estimated: >60 mL/min (ref >60)
Glucose, Bld: 215 mg/dL — ABNORMAL HIGH (ref 70–99)
Potassium: 4.1 mmol/L (ref 3.5–5.1)
Sodium: 134 mmol/L — ABNORMAL LOW (ref 135–145)

## 2023-01-31 LAB — CBC
HCT: 24.5 % — ABNORMAL LOW (ref 39.0–52.0)
HCT: 26.6 % — ABNORMAL LOW (ref 39.0–52.0)
Hemoglobin: 8.5 g/dL — ABNORMAL LOW (ref 13.0–17.0)
Hemoglobin: 9.2 g/dL — ABNORMAL LOW (ref 13.0–17.0)
MCH: 31.5 pg (ref 26.0–34.0)
MCH: 31.6 pg (ref 26.0–34.0)
MCHC: 34.7 g/dL (ref 30.0–36.0)
MCHC: 34.6 g/dL (ref 30.0–36.0)
MCV: 90.7 fL (ref 80.0–100.0)
MCV: 91.4 fL (ref 80.0–100.0)
Platelets: 181 10*3/uL (ref 150–400)
Platelets: 196 10*3/uL (ref 150–400)
RBC: 2.7 MIL/uL — ABNORMAL LOW (ref 4.22–5.81)
RBC: 2.91 MIL/uL — ABNORMAL LOW (ref 4.22–5.81)
RDW: 22.7 % — ABNORMAL HIGH (ref 11.5–15.5)
RDW: 22.9 % — ABNORMAL HIGH (ref 11.5–15.5)
WBC: 15.8 10*3/uL — ABNORMAL HIGH (ref 4.0–10.5)
WBC: 15.4 10*3/uL — ABNORMAL HIGH (ref 4.0–10.5)
nRBC: 0.9 % — ABNORMAL HIGH (ref 0.0–0.2)
nRBC: 1 % — ABNORMAL HIGH (ref 0.0–0.2)

## 2023-01-31 LAB — GLUCOSE, CAPILLARY
Glucose-Capillary: 142 mg/dL — ABNORMAL HIGH (ref 70–99)
Glucose-Capillary: 153 mg/dL — ABNORMAL HIGH (ref 70–99)
Glucose-Capillary: 155 mg/dL — ABNORMAL HIGH (ref 70–99)
Glucose-Capillary: 173 mg/dL — ABNORMAL HIGH (ref 70–99)
Glucose-Capillary: 250 mg/dL — ABNORMAL HIGH (ref 70–99)

## 2023-01-31 LAB — HEPATIC FUNCTION PANEL
ALT: 16 U/L (ref 0–44)
AST: 21 U/L (ref 15–41)
Albumin: 1.7 g/dL — ABNORMAL LOW (ref 3.5–5.0)
Alkaline Phosphatase: 86 U/L (ref 38–126)
Bilirubin, Direct: 0.8 mg/dL — ABNORMAL HIGH (ref 0.0–0.2)
Indirect Bilirubin: 0.9 mg/dL (ref 0.3–0.9)
Total Bilirubin: 1.7 mg/dL — ABNORMAL HIGH (ref 0.0–1.2)
Total Protein: 4.2 g/dL — ABNORMAL LOW (ref 6.5–8.1)

## 2023-01-31 LAB — COOXEMETRY PANEL
Carboxyhemoglobin: 1.5 % (ref 0.5–1.5)
Methemoglobin: <0.7 % (ref 0.0–1.5)
O2 Saturation: 66.4 %
Total hemoglobin: 9 g/dL — ABNORMAL LOW (ref 12.0–16.0)

## 2023-01-31 LAB — CULTURE, BLOOD (ROUTINE X 2)

## 2023-01-31 LAB — CULTURE, RESPIRATORY W GRAM STAIN: Culture: NORMAL

## 2023-01-31 LAB — APTT
aPTT: 58 s — ABNORMAL HIGH (ref 24–36)
aPTT: 58 s — ABNORMAL HIGH (ref 24–36)
aPTT: 69 s — ABNORMAL HIGH (ref 24–36)

## 2023-01-31 LAB — MAGNESIUM
Magnesium: 1.4 mg/dL — ABNORMAL LOW (ref 1.7–2.4)
Magnesium: 1.9 mg/dL (ref 1.7–2.4)

## 2023-01-31 LAB — PHOSPHORUS: Phosphorus: 1.7 mg/dL — ABNORMAL LOW (ref 2.5–4.6)

## 2023-01-31 LAB — HEPARIN LEVEL (UNFRACTIONATED): Heparin Unfractionated: 0.98 [IU]/mL — ABNORMAL HIGH (ref 0.30–0.70)

## 2023-01-31 MED ORDER — THIAMINE MONONITRATE 100 MG PO TABS
100.0000 mg | ORAL_TABLET | Freq: Every day | ORAL | Status: DC
  Administered 2023-01-31 – 2023-02-01 (×2): 100 mg
  Filled 2023-01-31 (×2): qty 1

## 2023-01-31 MED ORDER — METHYLPREDNISOLONE SODIUM SUCC 40 MG IJ SOLR
40.0000 mg | Freq: Every day | INTRAMUSCULAR | Status: DC
  Administered 2023-01-31 – 2023-02-01 (×2): 40 mg via INTRAVENOUS
  Filled 2023-01-31 (×2): qty 1

## 2023-01-31 MED ORDER — SODIUM PHOSPHATES 45 MMOLE/15ML IV SOLN
30.0000 mmol | Freq: Once | INTRAVENOUS | Status: AC
  Administered 2023-02-01: 30 mmol via INTRAVENOUS
  Filled 2023-01-31: qty 10

## 2023-01-31 MED ORDER — FOLIC ACID 1 MG PO TABS
1.0000 mg | ORAL_TABLET | Freq: Every day | ORAL | Status: DC
  Administered 2023-01-31 – 2023-02-01 (×2): 1 mg
  Filled 2023-01-31 (×2): qty 1

## 2023-01-31 MED ORDER — FUROSEMIDE 10 MG/ML IJ SOLN
40.0000 mg | Freq: Once | INTRAMUSCULAR | Status: AC
  Administered 2023-01-31: 40 mg via INTRAVENOUS
  Filled 2023-01-31: qty 4

## 2023-01-31 MED ORDER — SODIUM CHLORIDE 3 % IN NEBU
4.0000 mL | INHALATION_SOLUTION | Freq: Every day | RESPIRATORY_TRACT | Status: DC
  Administered 2023-01-31 – 2023-02-01 (×2): 4 mL via RESPIRATORY_TRACT
  Filled 2023-01-31 (×3): qty 4

## 2023-01-31 MED ORDER — MAGNESIUM SULFATE 4 GM/100ML IV SOLN
4.0000 g | Freq: Once | INTRAVENOUS | Status: AC
  Administered 2023-01-31: 4 g via INTRAVENOUS
  Filled 2023-01-31: qty 100

## 2023-01-31 MED ORDER — FUROSEMIDE 10 MG/ML IJ SOLN
80.0000 mg | Freq: Once | INTRAMUSCULAR | Status: AC
  Administered 2023-01-31: 80 mg via INTRAVENOUS
  Filled 2023-01-31: qty 8

## 2023-01-31 MED ORDER — PROSOURCE TF20 ENFIT COMPATIBL EN LIQD
60.0000 mL | Freq: Three times a day (TID) | ENTERAL | Status: DC
  Administered 2023-01-31 – 2023-02-01 (×3): 60 mL
  Filled 2023-01-31 (×3): qty 60

## 2023-01-31 MED ORDER — ADULT MULTIVITAMIN W/MINERALS CH
1.0000 | ORAL_TABLET | Freq: Every day | ORAL | Status: DC
  Administered 2023-01-31 – 2023-02-01 (×2): 1 via ORAL
  Filled 2023-01-31 (×2): qty 1

## 2023-01-31 MED ORDER — VITAL 1.5 CAL PO LIQD
1000.0000 mL | ORAL | Status: DC
  Administered 2023-01-31: 1000 mL

## 2023-01-31 NOTE — Procedures (Signed)
 Cortrak  Tube Type:  Cortrak - 43 inches Tube Location:  Left nare Initial Placement:  Stomach Secured by: Bridle Technique Used to Measure Tube Placement:  Marking at nare/corner of mouth Cortrak Secured At:  80 cm   Cortrak Tube Team Note:  Consult received to place a Cortrak feeding tube.   X-ray is required, abdominal x-ray has been ordered by the Cortrak team. Please confirm tube placement before using the Cortrak tube.   If the tube becomes dislodged please keep the tube and contact the Cortrak team at [email or web address] for replacement.  If after hours and replacement cannot be delayed, place a NG tube and confirm placement with an abdominal x-ray.    Augustin Shams MS, RD, LDN If unable to be reached, please send secure chat to RD inpatient available from 8:00a-4:00p daily

## 2023-01-31 NOTE — Progress Notes (Signed)
 Initial Nutrition Assessment  DOCUMENTATION CODES:   Severe malnutrition in context of chronic illness  INTERVENTION:   Change to Dysphagia 3 (easy to chew)  Continue Ensure Enlive po TID, each supplement provides 350 kcal and 20 grams of protein.  Tube Feeding via Cortrak:  Start Vital 1.5 at 20 ml/hr with titration of 10 mL q 8 hours until goal rate of 55 ml/hr Pro-Source TF20 60 mL TID TF at goal provides 2200 kcals, 149 g of protein and 1003 mL of free water   Monitor magnesium , potassium, and phosphorus BID for at least 3 days, MD to replete as needed, as pt is at risk for refeeding syndrome given severe malnutrition with significant wt loss and poor intake. Add Thiamine  100 mg daily and folic acid  1 mg daily.   If pt with significant loose stool post TF initiation and/or signs of malabsorption/maldigestion, recommend  Relizorb   NUTRITION DIAGNOSIS:   Severe Malnutrition related to chronic illness as evidenced by energy intake < 75% for > or equal to 1 month, severe muscle depletion, edema, percent weight loss.  Being addressed via TF   GOAL:   Patient will meet greater than or equal to 90% of their needs  MONITOR:   TF tolerance, PO intake, Weight trends, Labs  REASON FOR ASSESSMENT:   Consult Calorie Count  ASSESSMENT:   73 yo male presents with complaints of increasing generalized weakness over the past 2 weeks with poor po intake and cough and admitted with shock, acute on chronic HF with RV failure, acute respiratory failure with b/l multifocal pneumonia PMH includes COPD, OSA, HFrEF, CAD, HTN, PAF on eliquis , sick sinus syndrome post PPM on 12/27/22, CKD, DM, GERD, cirrhosis, pancreatitis with hx of distal pancreatectomy, colon resection, chronic back pain, melanoma, iron def anemia  1/03 Admitted 1/06 Cortrak placed  ECHO: EF 60-65%, moderately dilated RV dysfunction  CT A/P with colitis, ?ischemia with hypotension. Pt with chronic pancreatitis (Chronic  calcifications throughout the pancreatic head and proximal body consistent with chronic calcific pancreatitis. There is marked atrophy of the pancreatic body and tail). Need to monitor TF tolerance, stool output/frequency/consistency for signs of maldigestion/malabsorption. Per Home Med List, pt not on PERT. Noted possible +diarrhea prior to admission  Pt currently on ventimask. Levophed  at 7, dobutamine  2.5  Pt currently on Carb Modified diet, eating minimally per RN Cortrak placed today  +altered taste, nothing tastes good. Appetite has been poor or months.  Pt eating bites at each meal at home, breakfast might be a few bites of eggs or breakfast biscuit for example. Pt has also been sleeping a lot. Pt says Ensure is ok, not drinking regularly at home but has been trying to drink since being in the hospital. Pt ate jello this AM, po intake somewhat limited also by poor respiratory status.   +progressive BLE swelling at home despite lasix . Given significant edema in BLE, current dry weight unknown.  Reports 67 pound unintentional wt loss since June 2024. Pt reports UBW around 258 pounds, current wt 183 pounds with edema present. +29% wt loss even with edema present, expect actual percent wt loss even higher.   Lactic Acid 5.2 on 1/4, down to 1.9 this AM  Labs: CBGs 141-173 (goal 140-180), BUN/Creatinine wdl, magnesium  1.4 (L), potassium 4.3 (wdL), phosphorus 2.9 (wdl) Meds: lasix , solumedrol, mag sulfate   NUTRITION - FOCUSED PHYSICAL EXAM: Flowsheet Row Most Recent Value  Orbital Region Moderate depletion  Upper Arm Region Moderate depletion  Thoracic and Lumbar Region  Severe depletion  Buccal Region Moderate depletion  Temple Region Severe depletion  Clavicle Bone Region Severe depletion  Clavicle and Acromion Bone Region Severe depletion  Scapular Bone Region Moderate depletion  Dorsal Hand Unable to assess  Patellar Region Severe depletion  Anterior Thigh Region Moderate depletion   Posterior Calf Region Moderate depletion  Edema (RD Assessment) Moderate  Hair Reviewed  Eyes Reviewed  Mouth Reviewed  Skin Reviewed  [ecchymosis]  Nails Reviewed        Diet Order:   Diet Order             Diet Carb Modified Fluid consistency: Thin; Room service appropriate? Yes  Diet effective now                   EDUCATION NEEDS:   Not appropriate for education at this time  Skin:  Skin Assessment: Reviewed RN Assessment  Last BM:  1/6  Height:   Ht Readings from Last 1 Encounters:  01/29/23 6' (1.829 m)    Weight:   Wt Readings from Last 1 Encounters:  01/31/23 83 kg     BMI:  Body mass index is 24.82 kg/m.  Estimated Nutritional Needs:   Kcal:  2200-2400 kcals  Protein:  120-145 g  Fluid:  1.8 L   Betsey Finger MS, RDN, LDN, CNSC Registered Dietitian 3 Clinical Nutrition RD Inpatient Contact Info in Amion

## 2023-01-31 NOTE — Progress Notes (Signed)
 PHARMACY - ANTICOAGULATION CONSULT NOTE  Pharmacy Consult for heparin  Indication: atrial fibrillation  Allergies  Allergen Reactions   Levaquin  [Levofloxacin ] Rash and Other (See Comments)    Alters mood    Patient Measurements: Height: 6' (182.9 cm) Weight: 83 kg (182 lb 15.7 oz) IBW/kg (Calculated) : 77.6 Heparin  Dosing Weight: 87.3 kg    Vital Signs: Temp: 99.6 F (37.6 C) (01/06 1115) Temp Source: Axillary (01/06 1115) BP: 97/67 (01/06 1100) Pulse Rate: 80 (01/06 1115)  Labs: Recent Labs    01/28/23 1302 01/28/23 2000 01/29/23 0456 01/29/23 1355 01/29/23 1523 01/30/23 0415 01/31/23 0433 01/31/23 1007  HGB 8.6*   < > 8.5*  --   --  8.1* 8.5*  --   HCT 25.5*   < > 24.6*  --   --  22.8* 24.5*  --   PLT 204  --  203 192  --  191 181  --   APTT 41*  --  55* 83*   < > 86* 58* 58*  LABPROT 35.4*  --  34.6* 34.9*  --   --   --   --   INR 3.5*  --  3.4* 3.4*  --   --   --   --   HEPARINUNFRC  --   --  >1.10*  --   --  >1.10* 0.98*  --   CREATININE 1.84*  --  1.52*  --   --  1.07  --   --    < > = values in this interval not displayed.    Estimated Creatinine Clearance: 69.5 mL/min (by C-G formula based on SCr of 1.07 mg/dL).   Medical History: Past Medical History:  Diagnosis Date   Abnormal CT of the abdomen 05/27/2018   Abnormal MRI of abdomen 04/14/2018   Acute pancreatitis 02/20/2019   Anemia    Anxiety disorder 04/09/2015   Arthritis    Asthma    Atrial fibrillation (HCC) 11/21/2008   Qualifier: Diagnosis of   By: Waddell, MD, CODY Danelle Fallow        CAD (coronary artery disease)    a. S/P PCI mid LAD (vision stent) 08/31/2004;  b. repeat cath 6/11 - no progression of CAD. c. Cath 10/2012 given moderate CAD on cardiac CT - no change from prior cath.   Centrilobular emphysema (HCC) 10/08/2022   Chest CT 08/04/2022     Chest pain 12/16/2016   CHEST PAIN UNSPECIFIED 11/21/2008   Qualifier: Diagnosis of   By: Ardelia, EMT-P, Tara         CHF (congestive  heart failure) (HCC), reduced LVEF 35 to 40% by TTE 11/19/2022, new decrease compared to prior echocardiogram 2022.  Likely tachycardia mediated in the setting of A-fib RVR 12/17/2022   Chronic respiratory failure with hypoxia (HCC) 02/24/2017   Chronic systolic CHF (congestive heart failure) (HCC) 01/14/2023   Cirrhosis of liver without ascites (HCC) 04/14/2018   CKD (chronic kidney disease), stage III (HCC) 02/20/2019   COPD (chronic obstructive pulmonary disease) (HCC)    COPD without exacerbation (HCC) 05/15/2009   Qualifier: Diagnosis of   By: Jude MD, Harden GAILS.         COVID-19 virus infection    Cyst of nipple, right 11/03/2020   Degeneration of lumbar intervertebral disc 04/09/2015   Diverticulitis    Dyslipidemia    Essential hypertension 10/15/2008   Qualifier: Diagnosis of   By: Rolan Hough         Gallstones    GERD  10/15/2008   Qualifier: Diagnosis of   By: Rolan Hough         High risk medication use 04/09/2015   History of COVID-19 01/24/19 02/20/2019   History of kidney stones    History of pelvic fracture 01/01/2020   History of tattoo 08/23/2016   History of UTI 11/28/2019   Hypercoagulable state due to persistent atrial fibrillation (HCC) 03/26/2022   Hyperlipidemia 10/15/2008   Qualifier: Diagnosis of   By: Rolan Hough         Hypertension    Hyperthyroidism    HYPERTRIGLYCERIDEMIA 10/15/2008   Qualifier: Diagnosis of   By: Rolan Hough         Hypotension 06/13/2019   Idiopathic acute pancreatitis 04/14/2018   Incisional hernia, without obstruction or gangrene 02/05/2016   Infected pancreatic pseudocyst 06/07/2019   Insomnia 04/09/2015   Iron deficiency anemia 05/03/2022   Kidney mass 07/06/2017   Following with urology. Chao.  Recommend follow up. Last seen 01/2018  Refuses to see again.   CT okay 04/2021     Long term (current) use of anticoagulants [Z79.01] 06/10/2015   Melanoma (HCC)    melanoma removed from neck    Mild episode of recurrent  major depressive disorder (HCC) 08/27/2015   NEPHROLITHIASIS 10/15/2008   Qualifier: Diagnosis of   By: Rolan Hough         OA (osteoarthritis) of hip 12/18/2014   On home oxygen therapy    2L q hs (11/08/2012),  06/04/19 pt. not on oxygen   OSA (obstructive sleep apnea)    a. cpap noncompliance- patient reports on 12/10/14- does not use CPAP   Palpitations    a. 10/2012 - s/p LINQ loop recorder to assess for arrhythmia.    Pancreatic cyst 04/14/2018   Pancreatic pseudocyst 06/07/2019   Pancreatitis    Paroxysmal A-fib (HCC) 11/21/2008   a. S/P PVI 2/11 and 9/11   Paroxysmal atrial fibrillation (HCC) 01/14/2023   Pelvic fracture (HCC) 12/31/2019   Persistent atrial fibrillation (HCC) 06/08/2022   PICC (peripherally inserted central catheter) in place    Pneumonia    Protein-calorie malnutrition, severe 06/12/2019   Pseudocyst of pancreas 03/06/2019   Pure hypercholesterolemia    S/P coronary artery stent placement    SHORTNESS OF BREATH 03/19/2009   Qualifier: Diagnosis of   By: Kelsie MD, James         Sleep apnea 05/17/2009   Qualifier: Diagnosis of   By: Jude MD, Harden GAILS.         SVT (supraventricular tachycardia) (HCC)    a. Atypical AVNRT of the slow pathway   Thoracic aortic aneurysm (HCC) 11/13/2020   Type II diabetes mellitus (HCC)    Unstable angina (HCC) 10/15/2008   Qualifier: Diagnosis of   By: Rolan Hough          Medications:  Scheduled:   baclofen   10 mg Oral TID   Chlorhexidine  Gluconate Cloth  6 each Topical Daily   feeding supplement  237 mL Oral TID BM   folic acid   1 mg Per Tube Daily   furosemide   80 mg Intravenous Once   gabapentin   300 mg Oral TID   guaiFENesin   600 mg Oral BID   influenza vaccine adjuvanted  0.5 mL Intramuscular Tomorrow-1000   lidocaine   1 patch Transdermal Q24H   methylPREDNISolone  (SOLU-MEDROL ) injection  40 mg Intravenous Daily   mometasone -formoterol   2 puff Inhalation BID   pantoprazole  (PROTONIX ) IV  40 mg  Intravenous Q24H  revefenacin   175 mcg Nebulization Daily   sodium chloride  HYPERTONIC  4 mL Nebulization Daily   tamsulosin   0.4 mg Oral Daily   thiamine   100 mg Per Tube Daily   Infusions:   heparin  1,500 Units/hr (01/31/23 1131)   magnesium  sulfate bolus IVPB     meropenem  (MERREM ) IV Stopped (01/31/23 0552)   norepinephrine  (LEVOPHED ) Adult infusion 7 mcg/min (01/31/23 1131)   PRN: acetaminophen , albuterol , benzonatate , docusate sodium , fentaNYL  (SUBLIMAZE ) injection, lip balm, ondansetron  (ZOFRAN ) IV, oxyCODONE , phenol, polyethylene glycol  Assessment: Patient admitted for cardiogenic vs. Septic shock. On Eliquis  PTA for afib.  Last dose was 1/2 at 17:30. Will need heparin  levels and aPTT until they are correlating.   aPTT was drawn slightly early (~4 hr mark given concerns for bleeding at IV site), debated pausing heparin  but continued to see what level was. aPTT came back slightly subtherapeutic at 58, on 1500 units/hr. Hgb 8.5, plt 181. No infusion issues and being drawn appropriately.   Goal of Therapy:  Heparin  level 0.3-0.7 units/ml aPTT 66-102 seconds Monitor platelets by anticoagulation protocol: Yes   Plan:  Increase heparin  infusion at 1600 units/hr Order 8 hr levels  Daily heparin  level and CBC  Thank you for allowing pharmacy to participate in this patient's care,  Suzen Sour, PharmD, BCCCP Clinical Pharmacist  Phone: [phone] 01/31/2023 11:38 AM  Please check AMION for all [facility] Pharmacy phone numbers After 10:00 PM, call Main Pharmacy [phone]

## 2023-01-31 NOTE — Progress Notes (Signed)
 PHARMACY - ANTICOAGULATION CONSULT NOTE  Pharmacy Consult for heparin  Indication: atrial fibrillation  Labs: Recent Labs    01/28/23 1302 01/28/23 2000 01/29/23 0456 01/29/23 1355 01/29/23 1523 01/30/23 0415 01/31/23 0433  HGB 8.6*   < > 8.5*  --   --  8.1* 8.5*  HCT 25.5*   < > 24.6*  --   --  22.8* 24.5*  PLT 204  --  203 192  --  191 181  APTT 41*  --  55* 83* 93* 86* 58*  LABPROT 35.4*  --  34.6* 34.9*  --   --   --   INR 3.5*  --  3.4* 3.4*  --   --   --   HEPARINUNFRC  --   --  >1.10*  --   --  >1.10* 0.98*  CREATININE 1.84*  --  1.52*  --   --  1.07  --    < > = values in this interval not displayed.   Assessment: 72yo male subtherapeutic on heparin  after a few PTTs at goal; no infusion issues or signs of bleeding per RN.  Goal of Therapy:  aPTT 66-102 seconds   Plan:  Increase heparin  infusion by 2 units/kg/hr to 1500 units/hr. Check PTT in 6 hours.   Marvetta Dauphin, PharmD, BCPS 01/31/2023 6:08 AM

## 2023-01-31 NOTE — Progress Notes (Signed)
 RT worked with patient on flutter. Patient does great with flutter and started to have a stronger cough. After using flutter patient desated to 80% on NRB. RT encouraged deeper breaths and patients stats slowly increased to 92%. RT will continue to monitor

## 2023-01-31 NOTE — Progress Notes (Signed)
 PHARMACY - ANTICOAGULATION CONSULT NOTE  Pharmacy Consult for heparin  Indication: atrial fibrillation  Allergies  Allergen Reactions   Levaquin  [Levofloxacin ] Rash and Other (See Comments)    Alters mood    Patient Measurements: Height: 6' (182.9 cm) Weight: 83 kg (182 lb 15.7 oz) IBW/kg (Calculated) : 77.6 Heparin  Dosing Weight: 87.3 kg    Vital Signs: Temp: 99.2 F (37.3 C) (01/06 2000) Temp Source: Axillary (01/06 2000) BP: 109/69 (01/06 1800) Pulse Rate: 73 (01/06 1800)  Labs: Recent Labs    01/29/23 0456 01/29/23 1355 01/29/23 1523 01/30/23 0415 01/31/23 0433 01/31/23 1007 01/31/23 1200 01/31/23 2014  HGB 8.5*  --   --  8.1* 8.5*  --  9.2*  --   HCT 24.6*  --   --  22.8* 24.5*  --  26.6*  --   PLT 203 192  --  191 181  --  196  --   APTT 55* 83*   < > 86* 58* 58*  --  69*  LABPROT 34.6* 34.9*  --   --   --   --   --   --   INR 3.4* 3.4*  --   --   --   --   --   --   HEPARINUNFRC >1.10*  --   --  >1.10* 0.98*  --   --   --   CREATININE 1.52*  --   --  1.07  --   --  1.03  --    < > = values in this interval not displayed.    Estimated Creatinine Clearance: 72.2 mL/min (by C-G formula based on SCr of 1.03 mg/dL).    Assessment: Patient admitted for cardiogenic vs. Septic shock. On Eliquis  PTA for afib.  Last dose was 1/2 at 17:30. Will need heparin  levels and aPTT until they are correlating.   aPTT 69 sec (therapeutic) on infusion at 1600 units/hr. No bleeding noted.  Goal of Therapy:  Heparin  level 0.3-0.7 units/ml aPTT 66-102 seconds Monitor platelets by anticoagulation protocol: Yes   Plan:  Continue heparin  infusion at 1600 units/hr  Daily heparin  level and CBC  Thank you for allowing pharmacy to participate in this patient's care,  Vito Ralph, PharmD, BCPS Please see amion for complete clinical pharmacist phone list 01/31/2023 8:48 PM

## 2023-01-31 NOTE — Progress Notes (Addendum)
 eLink Physician-Brief Progress Note Patient Name: [name] DOB: 28-Jun-1950 MRN: [id number]   Date of Service  01/31/2023  HPI/Events of Note  Phosphorus 1.7  eICU Interventions  Sodium Phos ordered   0400 -patient has hyperglycemia, initiate CBGs with tube feeds  Intervention Category Minor Interventions: Electrolytes abnormality - evaluation and management  Edmonia Gonser 01/31/2023, 9:02 PM

## 2023-01-31 NOTE — Progress Notes (Signed)
 NAME:  Steve Anthony, MRN:  [id number], DOB:  October 21, 1950, LOS: 3 ADMISSION DATE:  01/28/2023, CONSULTATION DATE:  01/27/22 REFERRING MD:  Dr. Mannie, CHIEF COMPLAINT:  hypotension   History of Present Illness:   20 yoM with PMH significant for but not limited to COPD, OSA, HFrEF, CAD, HTN, PAF on eliquis , sick sinus syndrome s/p PPM 12/27/22, CKD, DMT2, GERD, cirrhosis, hyperthyroidism, pancreatitis, chronic back pain, melanoma, and IDA who initially presented to urgent care today with complaints of increasing generalized weakness over 2 weeks, poor PO intake, and cough found to be hypotensive with SBP in 60's, 89% on room air.  Daughter at bedside who is an ER nurse and able to provide more history.  Reports pt has had progressive decline over several weeks.  Had staph infection in right forearm after a skin biopsy treated in November, then had PPM and ablation with EP on 12/2 for sick sinus and prior failed treatment for afib.  Was admitted 12/19 with dehydration and poor PO intake and ongoing low BP's in outpt setting found to have elevated lactic thought possibly to home jardiance  and metformin .  Taken off lopressor , jardiance  in December.  BP improved and he was discharged home next day to be able to see his mother prior to her passing away.  Since, pt continues to have poor PO intake and reports nothing taste good.  Reported 67 lb unintentional wt loss since June 2024.  Since his discharge, pt also has had dizzy spells and multiple falls at home, last on 12/31 in which he was evaluated at [facility] ER and discharged home.  Sleeps a lot per daughter.  Progressive BLE swelling despite taking lasix  at home.  Ongoing cough but now productive yellow over last week but denies any SOB.  Denies any pain, fever, N/V/D, chest/ abdominal pain, or hematemesis, hemoptysis, or bloody stools.  Reports some dysuria.  Last took meds, including eliquis  01/26/22 PM.  In ER, temp 99., initial SBP in 60's, and 89% on room air.   Given 2L LR and placed on peripheral NE for ongoing hypotension.      Labs noted for WBC 23, lactic 8.8 rising to 9.5 despite fluids, Hgb 8.6 (down from previous 11.1 on 12/20).  CTH and CT chest/ abd/ pelvis ordered and pending.  CXR showed mild vascular congestion and possible LLL retrocardiac opacity.  Bedside pocus echo per EDP concerning for poor EF, possibly 25%.  Pt has not required any supplemental O2 in ER, maintaining on room air.  PCCM called for admit.   Pertinent  Medical History   Past Medical History:  Diagnosis Date   Abnormal CT of the abdomen 05/27/2018   Abnormal MRI of abdomen 04/14/2018   Acute pancreatitis 02/20/2019   Anemia    Anxiety disorder 04/09/2015   Arthritis    Asthma    Atrial fibrillation (HCC) 11/21/2008   Qualifier: Diagnosis of   By: Waddell, MD, CODY Danelle Fallow        CAD (coronary artery disease)    a. S/P PCI mid LAD (vision stent) 08/31/2004;  b. repeat cath 6/11 - no progression of CAD. c. Cath 10/2012 given moderate CAD on cardiac CT - no change from prior cath.   Centrilobular emphysema (HCC) 10/08/2022   Chest CT 08/04/2022     Chest pain 12/16/2016   CHEST PAIN UNSPECIFIED 11/21/2008   Qualifier: Diagnosis of   By: Ardelia EMT-P, Tara         CHF (congestive heart failure) (  HCC), reduced LVEF 35 to 40% by TTE 11/19/2022, new decrease compared to prior echocardiogram 2022.  Likely tachycardia mediated in the setting of A-fib RVR 12/17/2022   Chronic respiratory failure with hypoxia (HCC) 02/24/2017   Chronic systolic CHF (congestive heart failure) (HCC) 01/14/2023   Cirrhosis of liver without ascites (HCC) 04/14/2018   CKD (chronic kidney disease), stage III (HCC) 02/20/2019   COPD (chronic obstructive pulmonary disease) (HCC)    COPD without exacerbation (HCC) 05/15/2009   Qualifier: Diagnosis of   By: Jude MD, Harden GAILS.         COVID-19 virus infection    Cyst of nipple, right 11/03/2020   Degeneration of lumbar intervertebral disc  04/09/2015   Diverticulitis    Dyslipidemia    Essential hypertension 10/15/2008   Qualifier: Diagnosis of   By: Rolan Hough         Gallstones    GERD 10/15/2008   Qualifier: Diagnosis of   By: Rolan Hough         High risk medication use 04/09/2015   History of COVID-19 01/24/19 02/20/2019   History of kidney stones    History of pelvic fracture 01/01/2020   History of tattoo 08/23/2016   History of UTI 11/28/2019   Hypercoagulable state due to persistent atrial fibrillation (HCC) 03/26/2022   Hyperlipidemia 10/15/2008   Qualifier: Diagnosis of   By: Rolan Hough         Hypertension    Hyperthyroidism    HYPERTRIGLYCERIDEMIA 10/15/2008   Qualifier: Diagnosis of   By: Rolan Hough         Hypotension 06/13/2019   Idiopathic acute pancreatitis 04/14/2018   Incisional hernia, without obstruction or gangrene 02/05/2016   Infected pancreatic pseudocyst 06/07/2019   Insomnia 04/09/2015   Iron deficiency anemia 05/03/2022   Kidney mass 07/06/2017   Following with urology. Chao.  Recommend follow up. Last seen 01/2018  Refuses to see again.   CT okay 04/2021     Long term (current) use of anticoagulants [Z79.01] 06/10/2015   Melanoma (HCC)    melanoma removed from neck    Mild episode of recurrent major depressive disorder (HCC) 08/27/2015   NEPHROLITHIASIS 10/15/2008   Qualifier: Diagnosis of   By: Rolan Hough         OA (osteoarthritis) of hip 12/18/2014   On home oxygen therapy    2L q hs (11/08/2012),  06/04/19 pt. not on oxygen   OSA (obstructive sleep apnea)    a. cpap noncompliance- patient reports on 12/10/14- does not use CPAP   Palpitations    a. 10/2012 - s/p LINQ loop recorder to assess for arrhythmia.    Pancreatic cyst 04/14/2018   Pancreatic pseudocyst 06/07/2019   Pancreatitis    Paroxysmal A-fib (HCC) 11/21/2008   a. S/P PVI 2/11 and 9/11   Paroxysmal atrial fibrillation (HCC) 01/14/2023   Pelvic fracture (HCC) 12/31/2019   Persistent atrial  fibrillation (HCC) 06/08/2022   PICC (peripherally inserted central catheter) in place    Pneumonia    Protein-calorie malnutrition, severe 06/12/2019   Pseudocyst of pancreas 03/06/2019   Pure hypercholesterolemia    S/P coronary artery stent placement    SHORTNESS OF BREATH 03/19/2009   Qualifier: Diagnosis of   By: Kelsie MD, James         Sleep apnea 05/17/2009   Qualifier: Diagnosis of   By: Jude MD, Harden GAILS.         SVT (supraventricular tachycardia) (HCC)    a. Atypical  AVNRT of the slow pathway   Thoracic aortic aneurysm (HCC) 11/13/2020   Type II diabetes mellitus (HCC)    Unstable angina (HCC) 10/15/2008   Qualifier: Diagnosis of   By: Rolan Hough         [facility] Events: Including procedures, antibiotic start and stop dates in addition to other pertinent events   1/3 Admitted, antibiotics started,   Interim History / Subjective:  No complaints. More hypoxic, needing NRB. Off DBA this morning. Still on NE, heparin  gtt.   Objective   Blood pressure 97/67, pulse 80, temperature 99.6 F (37.6 C), temperature source Axillary, resp. rate 20, height 6' (1.829 m), weight 83 kg, SpO2 (!) 85%. CVP:  [0 mmHg-20 mmHg] 7 mmHg  FiO2 (%):  [55 %] 55 %   Intake/Output Summary (Last 24 hours) at 01/31/2023 1127 Last data filed at 01/31/2023 1100 Gross per 24 hour  Intake 1020.91 ml  Output 1305 ml  Net -284.09 ml   Filed Weights   01/29/23 1429 01/30/23 0830 01/31/23 0500  Weight: 85.6 kg 84.5 kg 83 kg    Examination: General: ill appearing man lying in bed in NAD HEENT: [place]/AT, eyes anicteric Neuro: awake, still sleepy, globally weak. Answering questions appropriately CV:  S1S2, RRR, paced PULM: breathing comfortably on NRB, rhales anteriorly and decreased posterior breath sounds but symmetric GI: soft, NT Skin: bruising Skin: +edema worsening in arms and feet  CXR> multifocal infiltrates, no pneumothorax or effusion  cooximetry 66% BMP, CBC  pending  Blood cultures-  rare WBC Respiratory culture- pending Repeat blood cultures NGTD  UA 21-50 WBC Urine culture multiple species  Resolved Hospital Problem list   AGMA/ lactic acidosis, previously on metformin , which may contribute to slow clearance Diarrhea-resolved  Assessment & Plan:   Shock- appears septic due to GNR bacteremia, possibly a component of chronic heart failure causing cardiogenic shock. K. Pneumoniae bacteremia. Daughter said he felt like he has had a UTI for a week and he has progressive lung infiltrates on CT today. -Repeat blood cultures pending given recent pacemaker implantation in December 2024; remains no growth to date. - Con't meropenem  due to risk of resistant klebsiella - vasopressors PRN to maintain MAP>65- just on NE. Stopping DBA.  -needs diuresis -serial coox  AoC HFrEF and RV failure  Sick sinus syndrome s/p PPM 12/27/22 Afib s/p ablation - looks either junction vs apaced at times.  Rate regular -d/c dobutamine  -NE to maintain MAP >65  Acute respiratory failure with hypoxia COPD, not exacerbated Bilateral multifocal pneumonia, worsening. Viral vs bacterial pneumonia> was a likely source of his klebsiella bacteremia- ESBL Rhinovirus infection -droplet precautions -con't meropenem  -lasix  60mg  today -escalated to NRB vs HHFNC -CXR> nothing acutely to intervene on -adding steroids -con't bronchodilators  AKI, improved H/o urinary retention - strict I/O -awaiting BMP today -renally dose meds, avoid nephrotoxic meds -con't PTA tamsulosin  -maintain MAP >65 for renal perfusion  Acute normocytic anemia/ hx IDA> suspect the acute drop in his hemoglobin is due to significant bruising after multiple recent falls. Coagulopathy/ elevated INR (on eliquis ) -serial CBC -heparin , holding eliquis  -transfuse for Hb <7 or hemodynamically significant bleeding -Once bacteremia resolves needs iron repletion.  Recent falls and back pain> new T2  compression fracture. Old compression fracture T7 seen on CT Chronic LBP, multiple previous surgeries --con't PTA oxy, gabapentin , baclofen  -lidocaine  patch, PRN tylenol   DMT2, hyperglycemia- controlled without insulin . Prior A1c 6.2 on 12/27/22, -SSI PRN; hasn't been needing -goal BG 140-180 -con't holding metformin , wouldn't  restart. Has been off jardiance  PTA.   GERD -PPI  Hyperbilirubinemia likely due to sepsis, no evidence of biliary disease.  Improving. -treat sepsis -monitor  Severe protein calorie malnutrition, nearly 70 lb unintentional wt loss reported since 06/2022 Severe deconditioning, ?FTT, frequent falls -cortrak -encourage PO intake -suspect some weight loss was attributable to jardiance   Vision loss PTA; hasn't been able to follow up with ophtho, family denies glaucoma, macular degeneration, diabetic retinopathy history.  Home blood sugars have been less than 200. - Needs outpatient follow-up with ophthalmology. This will be a hard issue to fully assess and resolve as an IP.  GOC - At admission pt verbalized wishes of DNR/ DNI but wishes for all other aggressive medical care and interventions including CVL/ pressors.    Wife updated at bedside.   Best Practice (right click and Reselect all SmartList Selections daily)   Diet/type: Regular consistency (see orders) DVT prophylaxis systemic heparin  Pressure ulcer(s): N/A> skin tear to R forearm POA GI prophylaxis: PPI Lines: Central line and Arterial Line Foley:  removal ordered  Code Status:  DNR/ DNI per pt's expressed wishes, daughter present- ok with CVL/ pressors, other aggressive medical care Last date of multidisciplinary goals of care discussion [1/4]  Labs   CBC: Recent Labs  Lab 01/28/23 1302 01/28/23 2000 01/28/23 2023 01/28/23 2302 01/29/23 0456 01/29/23 1355 01/30/23 0415 01/31/23 0433  WBC 23.2*  --   --   --  23.7*  --  17.5* 15.8*  NEUTROABS 21.5*  --   --   --   --   --   --   --    HGB 8.6*   < > 8.8* 8.6* 8.5*  --  8.1* 8.5*  HCT 25.5*   < > 26.0* 25.0* 24.6*  --  22.8* 24.5*  MCV 95.1  --   --   --  91.4  --  90.1 90.7  PLT 204  --   --   --  203 192 191 181   < > = values in this interval not displayed.    Basic Metabolic Panel: Recent Labs  Lab 01/28/23 1302 01/28/23 2000 01/28/23 2023 01/29/23 0456 01/30/23 0415 01/31/23 0433  NA 137  --  132* 135 137  --   K 4.2  --  4.6 4.4 4.3  --   CL 96*  --   --  99 99  --   CO2 12*  --   --  13* 19*  --   GLUCOSE 96  --   --  142* 134*  --   BUN 8  --   --  8 9  --   CREATININE 1.84*  --   --  1.52* 1.07  --   CALCIUM  7.6*  --   --  8.2* 8.1*  --   MG  --  1.1*  --  2.2  --  1.4*  PHOS  --   --   --  2.9  --   --    GFR: Estimated Creatinine Clearance: 69.5 mL/min (by C-G formula based on SCr of 1.07 mg/dL). Recent Labs  Lab 01/28/23 1302 01/28/23 1317 01/28/23 2000 01/29/23 0456 01/29/23 1355 01/30/23 0415 01/31/23 0433  PROCALCITON  --   --  4.79 3.53  --   --   --   WBC 23.2*  --   --  23.7*  --  17.5* 15.8*  LATICACIDVEN  --    < > 8.2* 6.9* 5.2* 1.9  --    < > =  values in this interval not displayed.    Liver Function Tests: Recent Labs  Lab 01/28/23 1302 01/29/23 0456 01/30/23 0415  AST 33 35 25  ALT 15 16 16   ALKPHOS 74 82 69  BILITOT 1.7* 2.1* 1.8*  PROT 3.9* 4.3* 3.9*  ALBUMIN  1.7* 1.8* 1.7*   Recent Labs  Lab 01/28/23 2000  LIPASE 17     Critical care time:       This patient is critically ill with multiple organ system failure which requires frequent high complexity decision making, assessment, support, evaluation, and titration of therapies. This was completed through the application of advanced monitoring technologies and extensive interpretation of multiple databases. During this encounter critical care time was devoted to patient care services described in this note for 44 minutes.  Leita SHAUNNA Gaskins, DO 01/31/23 11:56 AM [facility]  For  contact information, see Amion. If no response to pager, please call PCCM consult pager. After hours, 7PM- 7AM, please call Elink.

## 2023-01-31 NOTE — Progress Notes (Signed)
 Patient ID: Steve Anthony, male   DOB: Jul 16, 1950, 73 y.o.   MRN: [id number]     Advanced Heart Failure Rounding Note  Cardiologist: None  Chief Complaint: lightheadedness Subjective:    - Lactic acid wnl from 5.2  - sCr at 1.  - mixed venous 66%  Resting comfortably in bed with NRB  Currently on NE at 7  Objective:   Weight Range: 83 kg Body mass index is 24.82 kg/m.   Vital Signs:   Temp:  [97.2 F (36.2 C)-99.9 F (37.7 C)] 99.6 F (37.6 C) (01/06 0900) Pulse Rate:  [69-84] 79 (01/06 0915) Resp:  [14-28] 21 (01/06 1000) BP: (74-123)/(58-85) 105/69 (01/06 1000) SpO2:  [83 %-99 %] 91 % (01/06 0915) Arterial Line BP: (90-141)/(46-83) 129/60 (01/06 1000) FiO2 (%):  [55 %] 55 % (01/06 0849) Weight:  [83 kg] 83 kg (01/06 0500) Last BM Date : 01/31/23  Weight change: Filed Weights   01/29/23 1429 01/30/23 0830 01/31/23 0500  Weight: 85.6 kg 84.5 kg 83 kg    Intake/Output:   Intake/Output Summary (Last 24 hours) at 01/31/2023 1041 Last data filed at 01/31/2023 1000 Gross per 24 hour  Intake 1019.54 ml  Output 1105 ml  Net -85.46 ml     CVP 8 Physical Exam    General:  well appearing.  No respiratory difficulty HEENT: +NRB, +cortrak Neck: supple. JVD ~8. Carotids 2+ bilat; no bruits. No lymphadenopathy or thyromegaly appreciated. RIJ CVC Cor: PMI nondisplaced. Regular rate & rhythm. No rubs, gallops or murmurs. Lungs: clear Abdomen: soft, nontender, nondistended. No hepatosplenomegaly. No bruits or masses. Good bowel sounds. Extremities: no cyanosis, clubbing, rash, +1 BLE edema. LR a line Neuro: alert & oriented x 3, cranial nerves grossly intact. moves all 4 extremities w/o difficulty. Affect pleasant.   Telemetry   Paced, NSR 70s (Personally reviewed)    Labs    CBC Recent Labs    01/28/23 1302 01/28/23 2000 01/30/23 0415 01/31/23 0433  WBC 23.2*   < > 17.5* 15.8*  NEUTROABS 21.5*  --   --   --   HGB 8.6*   < > 8.1* 8.5*  HCT 25.5*   < > 22.8*  24.5*  MCV 95.1   < > 90.1 90.7  PLT 204   < > 191 181   < > = values in this interval not displayed.   Basic Metabolic Panel Recent Labs    98/95/74 0456 01/30/23 0415 01/31/23 0433  NA 135 137  --   K 4.4 4.3  --   CL 99 99  --   CO2 13* 19*  --   GLUCOSE 142* 134*  --   BUN 8 9  --   CREATININE 1.52* 1.07  --   CALCIUM  8.2* 8.1*  --   MG 2.2  --  1.4*  PHOS 2.9  --   --    Liver Function Tests Recent Labs    01/29/23 0456 01/30/23 0415  AST 35 25  ALT 16 16  ALKPHOS 82 69  BILITOT 2.1* 1.8*  PROT 4.3* 3.9*  ALBUMIN  1.8* 1.7*   Recent Labs    01/28/23 2000  LIPASE 17   Cardiac Enzymes No results for input(s): CKTOTAL, CKMB, CKMBINDEX, TROPONINI in the last 72 hours.  BNP: BNP (last 3 results) Recent Labs    01/13/23 1634 01/28/23 2000  BNP 721.6* 2,380.8*    ProBNP (last 3 results) No results for input(s): PROBNP in the last 8760 hours.  D-Dimer Recent Labs    01/29/23 1355  DDIMER <0.27   Imaging   DG CHEST PORT 1 VIEW Result Date: 01/31/2023 CLINICAL DATA:  200808 Hypoxia [id number] EXAM: PORTABLE CHEST 1 VIEW COMPARISON:  CXR 01/28/23 FINDINGS: Right-sided central venous catheter in place with the tip near the cavoatrial junction. Left-sided dual lead cardiac device with unchanged lead positioning. New/increased small bilateral pleural effusions, right-greater-than-left. Compared to prior exam there is interval increase in hazy multifocal bilateral airspace opacities could represent pulmonary edema or multifocal infection. No radiographically apparent displaced rib fractures. Visualized upper abdomen is unremarkable. IMPRESSION: 1. New/increased small bilateral pleural effusions, right-greater-than-left. 2. Compared to prior exam there is interval increase in hazy multifocal bilateral airspace opacities could represent pulmonary edema or multifocal infection. Electronically Signed   By: Lyndall Gore M.D.   On: 01/31/2023 08:23     Medications:   Scheduled Medications:  baclofen   10 mg Oral TID   Chlorhexidine  Gluconate Cloth  6 each Topical Daily   feeding supplement  237 mL Oral TID BM   gabapentin   300 mg Oral TID   guaiFENesin   600 mg Oral BID   influenza vaccine adjuvanted  0.5 mL Intramuscular Tomorrow-1000   lidocaine   1 patch Transdermal Q24H   mometasone -formoterol   2 puff Inhalation BID   pantoprazole  (PROTONIX ) IV  40 mg Intravenous Q24H   revefenacin   175 mcg Nebulization Daily   sodium chloride  HYPERTONIC  4 mL Nebulization Daily   tamsulosin   0.4 mg Oral Daily    Infusions:  DOBUTamine  2.5 mcg/kg/min (01/31/23 1000)   heparin  1,500 Units/hr (01/31/23 1000)   meropenem  (MERREM ) IV Stopped (01/31/23 0552)   norepinephrine  (LEVOPHED ) Adult infusion 7 mcg/min (01/31/23 1000)    PRN Medications: acetaminophen , albuterol , benzonatate , docusate sodium , fentaNYL  (SUBLIMAZE ) injection, lip balm, ondansetron  (ZOFRAN ) IV, oxyCODONE , phenol, polyethylene glycol Assessment/Plan   1. Shock: Suspect mixed septic/cardiogenic shock (primarily septic).  WBCs 23, PCT 3.53.  Klebsiella in blood cultures.   Echo showed EF 60-65%, moderately dilated and moderately dysfunctional RV, dilated IVC.  This looks like primarily RV failure.   He has been on Eliquis , so PE less likely though not ruled out by the non-PE protocol chest CT.  - Lactic acid now within normal limits; mixed venous 66% with low CVPs.  - Stop dobutamine  - Continue to wean levophed  as able with goal MAP 65.  - 40 IV lasix  x1 per primary. Avoid over diuresis with RV failure  2. RV failure: As above, echo showed EF 60-65%, moderately dilated and moderately dysfunctional RV, dilated IVC.  Primarily RV failure.  LV EF appears improved s/p AV nodal ablation and CRT-P placement in 12/24 (had been 35-40% on prior echo).    - Stop dobutamine    3. Atrial fibrillation: Permanent, s/p AV nodal ablation and CRT-P.  - continue hep gtt.   4. H/o GI  bleeding/anemia - Hgb stable at 9.2 today.   5. ID:  -CT chest/abd/pelvis with colitis noted.  Blood cultures growing Klebsiella, respiratory virus panel with rhinovirus.  - Patient has new CRT-P device. Klebsiella is only rare cause of endocarditis.  - WBC trending down - continue meropenem ; CCM following. Completed vancomycin .   6. Colitis: Noted on CT abdomen/pelvis.  ?Ischemic colitis with hypotension.  Abdomen is not tender on exam.   7. AKI on CKD stage 3 - sCr now within normal limits   8. Chronic pancreatitis: Noted again on CT.   9. Back pain: Patient has chronic low back pain, on  oxycodone  at home.  Now on Fentanyl  boluses.  Worsening pain while lying in bed and agitated.  - continue Fentanyl  25 mg     Length of Stay: 3  Kayah Hecker L Bayan Kushnir, NP  01/31/2023, 10:41 AM  Advanced Heart Failure Team Pager [phone] (M-F; 7a - 5p)  Please contact [facility] for night-coverage after hours (5p -7a ) and weekends on [email or web address]

## 2023-02-01 DIAGNOSIS — R7881 Bacteremia: Secondary | ICD-10-CM

## 2023-02-01 DIAGNOSIS — B348 Other viral infections of unspecified site: Secondary | ICD-10-CM

## 2023-02-01 DIAGNOSIS — Z7189 Other specified counseling: Secondary | ICD-10-CM

## 2023-02-01 DIAGNOSIS — R579 Shock, unspecified: Secondary | ICD-10-CM | POA: Diagnosis not present

## 2023-02-01 DIAGNOSIS — Z515 Encounter for palliative care: Secondary | ICD-10-CM

## 2023-02-01 DIAGNOSIS — R6521 Severe sepsis with septic shock: Secondary | ICD-10-CM | POA: Diagnosis not present

## 2023-02-01 DIAGNOSIS — S22020A Wedge compression fracture of second thoracic vertebra, initial encounter for closed fracture: Secondary | ICD-10-CM | POA: Diagnosis not present

## 2023-02-01 DIAGNOSIS — J9601 Acute respiratory failure with hypoxia: Secondary | ICD-10-CM

## 2023-02-01 DIAGNOSIS — B961 Klebsiella pneumoniae [K. pneumoniae] as the cause of diseases classified elsewhere: Secondary | ICD-10-CM | POA: Diagnosis not present

## 2023-02-01 DIAGNOSIS — A419 Sepsis, unspecified organism: Secondary | ICD-10-CM | POA: Diagnosis not present

## 2023-02-01 LAB — BASIC METABOLIC PANEL
Anion gap: 18 — ABNORMAL HIGH (ref 5–15)
BUN: 18 mg/dL (ref 8–23)
CO2: 23 mmol/L (ref 22–32)
Calcium: 8.4 mg/dL — ABNORMAL LOW (ref 8.9–10.3)
Chloride: 95 mmol/L — ABNORMAL LOW (ref 98–111)
Creatinine, Ser: 1.01 mg/dL (ref 0.61–1.24)
GFR, Estimated: >60 mL/min (ref >60)
Glucose, Bld: 306 mg/dL — ABNORMAL HIGH (ref 70–99)
Potassium: 3.9 mmol/L (ref 3.5–5.1)
Sodium: 136 mmol/L (ref 135–145)

## 2023-02-01 LAB — CBC
HCT: 24.7 % — ABNORMAL LOW (ref 39.0–52.0)
Hemoglobin: 8.6 g/dL — ABNORMAL LOW (ref 13.0–17.0)
MCH: 31.6 pg (ref 26.0–34.0)
MCHC: 34.8 g/dL (ref 30.0–36.0)
MCV: 90.8 fL (ref 80.0–100.0)
Platelets: 178 10*3/uL (ref 150–400)
RBC: 2.72 MIL/uL — ABNORMAL LOW (ref 4.22–5.81)
RDW: 22.4 % — ABNORMAL HIGH (ref 11.5–15.5)
WBC: 11.4 10*3/uL — ABNORMAL HIGH (ref 4.0–10.5)
nRBC: 1.1 % — ABNORMAL HIGH (ref 0.0–0.2)

## 2023-02-01 LAB — POCT I-STAT 7, (LYTES, BLD GAS, ICA,H+H)
Acid-Base Excess: 2 mmol/L (ref 0.0–2.0)
Bicarbonate: 25.7 mmol/L (ref 20.0–28.0)
Calcium, Ion: 1.28 mmol/L (ref 1.15–1.40)
HCT: 30 % — ABNORMAL LOW (ref 39.0–52.0)
Hemoglobin: 10.2 g/dL — ABNORMAL LOW (ref 13.0–17.0)
O2 Saturation: 90 %
Patient temperature: 98.6
Potassium: 3.8 mmol/L (ref 3.5–5.1)
Sodium: 134 mmol/L — ABNORMAL LOW (ref 135–145)
TCO2: 27 mmol/L (ref 22–32)
pCO2 arterial: 35.2 mm[Hg] (ref 32–48)
pH, Arterial: 7.471 — ABNORMAL HIGH (ref 7.35–7.45)
pO2, Arterial: 54 mm[Hg] — ABNORMAL LOW (ref 83–108)

## 2023-02-01 LAB — MAGNESIUM: Magnesium: 1.8 mg/dL (ref 1.7–2.4)

## 2023-02-01 LAB — GLUCOSE, CAPILLARY
Glucose-Capillary: 240 mg/dL — ABNORMAL HIGH (ref 70–99)
Glucose-Capillary: 289 mg/dL — ABNORMAL HIGH (ref 70–99)
Glucose-Capillary: 131 mg/dL — ABNORMAL HIGH (ref 70–99)

## 2023-02-01 LAB — COOXEMETRY PANEL
Carboxyhemoglobin: 1.4 % (ref 0.5–1.5)
Methemoglobin: 0.9 % (ref 0.0–1.5)
O2 Saturation: 74.4 %
Total hemoglobin: 8.8 g/dL — ABNORMAL LOW (ref 12.0–16.0)

## 2023-02-01 LAB — APTT: aPTT: 65 s — ABNORMAL HIGH (ref 24–36)

## 2023-02-01 LAB — PHOSPHORUS: Phosphorus: 2.2 mg/dL — ABNORMAL LOW (ref 2.5–4.6)

## 2023-02-01 LAB — HEPARIN LEVEL (UNFRACTIONATED): Heparin Unfractionated: 0.51 [IU]/mL (ref 0.30–0.70)

## 2023-02-01 MED ORDER — GABAPENTIN 250 MG/5ML PO SOLN
300.0000 mg | Freq: Three times a day (TID) | ORAL | Status: DC
  Administered 2023-02-01: 300 mg via ORAL
  Filled 2023-02-01 (×3): qty 6

## 2023-02-01 MED ORDER — FENTANYL 2500MCG IN NS 250ML (10MCG/ML) PREMIX INFUSION
0.0000 ug/h | INTRAVENOUS | Status: DC
  Administered 2023-02-01: 275 ug/h via INTRAVENOUS
  Administered 2023-02-01: 25 ug/h via INTRAVENOUS
  Administered 2023-02-02: 275 ug/h via INTRAVENOUS
  Filled 2023-02-01 (×3): qty 250

## 2023-02-01 MED ORDER — DIGOXIN 125 MCG PO TABS
0.1250 mg | ORAL_TABLET | Freq: Every day | ORAL | Status: DC
  Administered 2023-02-01: 0.125 mg via ORAL
  Filled 2023-02-01: qty 1

## 2023-02-01 MED ORDER — SODIUM CHLORIDE 0.9 % IV SOLN
1.0000 g | Freq: Three times a day (TID) | INTRAVENOUS | Status: DC
  Filled 2023-02-01: qty 20

## 2023-02-01 MED ORDER — GABAPENTIN 250 MG/5ML PO SOLN
300.0000 mg | Freq: Three times a day (TID) | ORAL | Status: DC
Start: 2023-02-01 — End: 2023-02-01
  Filled 2023-02-01: qty 6

## 2023-02-01 MED ORDER — LORAZEPAM 2 MG/ML IJ SOLN
0.5000 mg | INTRAMUSCULAR | Status: DC | PRN
  Administered 2023-02-01: 1 mg via INTRAVENOUS
  Filled 2023-02-01: qty 1

## 2023-02-01 MED ORDER — POLYVINYL ALCOHOL 1.4 % OP SOLN: 1.0000 [drp] | Freq: Four times a day (QID) | OPHTHALMIC | Status: DC | PRN

## 2023-02-01 MED ORDER — GLYCOPYRROLATE 0.2 MG/ML IJ SOLN: 0.2000 mg | INTRAMUSCULAR | Status: DC | PRN

## 2023-02-01 MED ORDER — INSULIN ASPART 100 UNIT/ML IJ SOLN
0.0000 [IU] | INTRAMUSCULAR | Status: DC
  Administered 2023-02-01: 5 [IU] via SUBCUTANEOUS
  Administered 2023-02-01: 8 [IU] via SUBCUTANEOUS
  Administered 2023-02-01: 2 [IU] via SUBCUTANEOUS

## 2023-02-01 MED ORDER — INSULIN ASPART 100 UNIT/ML IJ SOLN
3.0000 [IU] | INTRAMUSCULAR | Status: DC
  Administered 2023-02-01: 3 [IU] via SUBCUTANEOUS

## 2023-02-01 MED ORDER — GUAIFENESIN 100 MG/5ML PO LIQD
15.0000 mL | Freq: Four times a day (QID) | ORAL | Status: DC
  Administered 2023-02-01: 15 mL via ORAL
  Filled 2023-02-01: qty 15

## 2023-02-01 MED ORDER — BIOTENE DRY MOUTH MT LIQD: 15.0000 mL | OROMUCOSAL | Status: DC | PRN

## 2023-02-01 MED ORDER — MAGNESIUM SULFATE 2 GM/50ML IV SOLN
2.0000 g | Freq: Once | INTRAVENOUS | Status: AC
  Administered 2023-02-01: 2 g via INTRAVENOUS
  Filled 2023-02-01: qty 50

## 2023-02-01 MED ORDER — ALBUMIN HUMAN 5 % IV SOLN
12.5000 g | Freq: Once | INTRAVENOUS | Status: AC
Start: 2023-02-01 — End: 2023-02-01
  Administered 2023-02-01: 12.5 g via INTRAVENOUS
  Filled 2023-02-01: qty 250

## 2023-02-01 MED ORDER — FENTANYL BOLUS VIA INFUSION
50.0000 ug | INTRAVENOUS | Status: DC | PRN
  Administered 2023-02-01 (×9): 50 ug via INTRAVENOUS

## 2023-02-01 MED ORDER — VITAL 1.5 CAL PO LIQD
1000.0000 mL | ORAL | Status: DC
  Administered 2023-02-01: 1000 mL

## 2023-02-01 NOTE — Progress Notes (Signed)
 RT placed patient on HHFNC 50L 90% at this time, after speaking with MD. Patient was previously on NRB for about 24 hrs with unsuccessful attempts to wean off NRB. Patient is tolerating well at this time. RT will continue to monitor.

## 2023-02-01 NOTE — Progress Notes (Signed)
 Steve Anthony - daughter   Add: [address] [place],  [place] [address]

## 2023-02-01 NOTE — TOC Initial Note (Signed)
 Transition of Care [facility]) - Initial/Assessment Note    Patient Details  Name: Steve Anthony MRN: [id number] Date of Birth: 1950-09-03  [facility] CM/SW Contact:    Justina Delcia Czar, RN Phone Number: [phone] 02/01/2023, 3:21 PM  Clinical Narrative:                 CM spoke to wife and dtr at bedside. Plan is to meet with Palliative team. Pt will transition to comfort care.   Expected Discharge Plan:  (Comfort Care) Barriers to Discharge: No Barriers Identified   Patient Goals and CMS Choice            Expected Discharge Plan and Services                                              Prior Living Arrangements/Services                       Activities of Daily Living   ADL Screening (condition at time of admission) Independently performs ADLs?: Yes (appropriate for developmental age) Is the patient deaf or have difficulty hearing?: No Does the patient have difficulty seeing, even when wearing glasses/contacts?: Yes Does the patient have difficulty concentrating, remembering, or making decisions?: No  Permission Sought/Granted Permission sought to share information with : Case Manager, Family Supports                Emotional Assessment              Admission diagnosis:  Hypotension [I95.9] Septic shock (HCC) [A41.9, R65.21] Patient Active Problem List   Diagnosis Date Noted   Hypotension 01/28/2023   Dehydration 01/14/2023   Chronic systolic CHF (congestive heart failure) (HCC) 01/14/2023   Paroxysmal atrial fibrillation (HCC) 01/14/2023   History of COPD 01/14/2023   Lactic acidosis 01/13/2023   CHF (congestive heart failure) (HCC), reduced LVEF 35 to 40% by TTE 11/19/2022, new decrease compared to prior echocardiogram 2022.  Likely tachycardia mediated in the setting of A-fib RVR 12/17/2022   Anemia    Arthritis    Asthma    COPD (chronic obstructive pulmonary disease) (HCC)    COVID-19 virus infection     Diverticulitis    Gallstones    History of kidney stones    Hyperthyroidism    Melanoma (HCC)    On home oxygen therapy    OSA (obstructive sleep apnea)    Pancreatitis    PICC (peripherally inserted central catheter) in place    Pneumonia    SVT (supraventricular tachycardia) (HCC)    DM2 (diabetes mellitus, type 2) (HCC)    Centrilobular emphysema (HCC) 10/08/2022   Iron deficiency anemia 05/03/2022   Thoracic aortic aneurysm (HCC) 11/13/2020   Cyst of nipple, right 11/03/2020   History of pelvic fracture 01/01/2020   Pelvic fracture (HCC) 12/31/2019   History of UTI 11/28/2019   Protein-calorie malnutrition, severe 06/12/2019   Pancreatic pseudocyst 06/07/2019   Infected pancreatic pseudocyst 06/07/2019   Pseudocyst of pancreas 03/06/2019   Acute pancreatitis 02/20/2019   CKD stage 3a, GFR 45-59 ml/min (HCC) 02/20/2019   History of COVID-19 01/24/19 02/20/2019   Abnormal CT of the abdomen 05/27/2018   Recurrent pancreatitis 05/27/2018   Idiopathic acute pancreatitis 04/14/2018   Abnormal MRI of abdomen 04/14/2018   Pancreatic cyst 04/14/2018   Cirrhosis  of liver without ascites (HCC) 04/14/2018   Kidney mass 07/06/2017   Chronic hypoxic respiratory failure (HCC) 02/24/2017   Chest pain 12/16/2016   S/P coronary artery stent placement    History of tattoo 08/23/2016   Incisional hernia, without obstruction or gangrene 02/05/2016   Mild episode of recurrent major depressive disorder (HCC) 08/27/2015   Long term (current) use of anticoagulants [Z79.01] 06/10/2015   High risk medication use 04/09/2015   Degeneration of lumbar intervertebral disc 04/09/2015   Anxiety disorder 04/09/2015   Insomnia 04/09/2015   OA (osteoarthritis) of hip 12/18/2014   CAD  s/p PCI of Mid LAD 08/2004, Cath 06/2009 unchagned, recent CTA 2022 with severe LCX disease vs artifact; followed by Stress nuclear test, no ischemia 10/2020 12/31/2009   Sleep apnea 05/17/2009   COPD without exacerbation  (HCC) 05/15/2009   SHORTNESS OF BREATH 03/19/2009   CHEST PAIN UNSPECIFIED 11/21/2008   Permanent atrial fibrillation (HCC), s/p ablation 2011; failed rhythm control; persistent rapid rates, s/p AV nodal ablation and BiV permanent pacemaker implant 12/27/2022 11/21/2008   Hyperlipidemia 10/15/2008   Essential hypertension 10/15/2008   GERD 10/15/2008   NEPHROLITHIASIS 10/15/2008   PALPITATIONS 10/15/2008   Dyslipidemia 10/15/2008   PCP:  Thurmond Cathlyn LABOR., MD Pharmacy:   [facility] - [place], [place] - [address] [place] [address] Phone: [phone] Fax: [phone]  [facility] [address] [place] [address] Phone: [phone] Fax: [phone]     Social Drivers of Health (SDOH) Social History: SDOH Screenings   Food Insecurity: No Food Insecurity (01/29/2023)  Housing: Low Risk  (01/29/2023)  Transportation Needs: No Transportation Needs (01/29/2023)  Utilities: Not At Risk (01/29/2023)  Social Connections: Socially Integrated (01/29/2023)  Tobacco Use: Medium Risk (01/28/2023)   SDOH Interventions:     Readmission Risk Interventions     No data to display

## 2023-02-01 NOTE — Progress Notes (Signed)
 Comfort care orders initiated. Family at bedside

## 2023-02-01 NOTE — Progress Notes (Addendum)
 PHARMACY - ANTICOAGULATION CONSULT NOTE  Pharmacy Consult for heparin  Indication: atrial fibrillation  Allergies  Allergen Reactions   Levaquin  [Levofloxacin ] Rash and Other (See Comments)    Alters mood    Patient Measurements: Height: 6' (182.9 cm) Weight: 85.8 kg (189 lb 2.5 oz) IBW/kg (Calculated) : 77.6 Heparin  Dosing Weight: 87.3 kg    Vital Signs: Temp: 98.7 F (37.1 C) (01/07 0800) Temp Source: Axillary (01/07 0800) BP: 99/69 (01/07 1000) Pulse Rate: 73 (01/07 1000)  Labs: Recent Labs    01/29/23 1355 01/29/23 1523 01/30/23 0415 01/31/23 0433 01/31/23 1007 01/31/23 1200 01/31/23 2014 02/01/23 0344  HGB  --    < > 8.1* 8.5*  --  9.2*  --  8.6*  HCT  --    < > 22.8* 24.5*  --  26.6*  --  24.7*  PLT 192  --  191 181  --  196  --  178  APTT 83*   < > 86* 58* 58*  --  69* 65*  LABPROT 34.9*  --   --   --   --   --   --   --   INR 3.4*  --   --   --   --   --   --   --   HEPARINUNFRC  --   --  >1.10* 0.98*  --   --   --  0.51  CREATININE  --   --  1.07  --   --  1.03  --  1.01   < > = values in this interval not displayed.    Estimated Creatinine Clearance: 73.6 mL/min (by C-G formula based on SCr of 1.01 mg/dL).   Medical History: Past Medical History:  Diagnosis Date   Abnormal CT of the abdomen 05/27/2018   Abnormal MRI of abdomen 04/14/2018   Acute pancreatitis 02/20/2019   Anemia    Anxiety disorder 04/09/2015   Arthritis    Asthma    Atrial fibrillation (HCC) 11/21/2008   Qualifier: Diagnosis of   By: Waddell, MD, CODY Danelle Fallow        CAD (coronary artery disease)    a. S/P PCI mid LAD (vision stent) 08/31/2004;  b. repeat cath 6/11 - no progression of CAD. c. Cath 10/2012 given moderate CAD on cardiac CT - no change from prior cath.   Centrilobular emphysema (HCC) 10/08/2022   Chest CT 08/04/2022     Chest pain 12/16/2016   CHEST PAIN UNSPECIFIED 11/21/2008   Qualifier: Diagnosis of   By: Ardelia, EMT-P, Tara         CHF (congestive heart  failure) (HCC), reduced LVEF 35 to 40% by TTE 11/19/2022, new decrease compared to prior echocardiogram 2022.  Likely tachycardia mediated in the setting of A-fib RVR 12/17/2022   Chronic respiratory failure with hypoxia (HCC) 02/24/2017   Chronic systolic CHF (congestive heart failure) (HCC) 01/14/2023   Cirrhosis of liver without ascites (HCC) 04/14/2018   CKD (chronic kidney disease), stage III (HCC) 02/20/2019   COPD (chronic obstructive pulmonary disease) (HCC)    COPD without exacerbation (HCC) 05/15/2009   Qualifier: Diagnosis of   By: Jude MD, Harden GAILS.         COVID-19 virus infection    Cyst of nipple, right 11/03/2020   Degeneration of lumbar intervertebral disc 04/09/2015   Diverticulitis    Dyslipidemia    Essential hypertension 10/15/2008   Qualifier: Diagnosis of   By: Rolan Hough  Gallstones    GERD 10/15/2008   Qualifier: Diagnosis of   By: Rolan Hough         High risk medication use 04/09/2015   History of COVID-19 01/24/19 02/20/2019   History of kidney stones    History of pelvic fracture 01/01/2020   History of tattoo 08/23/2016   History of UTI 11/28/2019   Hypercoagulable state due to persistent atrial fibrillation (HCC) 03/26/2022   Hyperlipidemia 10/15/2008   Qualifier: Diagnosis of   By: Rolan Hough         Hypertension    Hyperthyroidism    HYPERTRIGLYCERIDEMIA 10/15/2008   Qualifier: Diagnosis of   By: Rolan Hough         Hypotension 06/13/2019   Idiopathic acute pancreatitis 04/14/2018   Incisional hernia, without obstruction or gangrene 02/05/2016   Infected pancreatic pseudocyst 06/07/2019   Insomnia 04/09/2015   Iron deficiency anemia 05/03/2022   Kidney mass 07/06/2017   Following with urology. Chao.  Recommend follow up. Last seen 01/2018  Refuses to see again.   CT okay 04/2021     Long term (current) use of anticoagulants [Z79.01] 06/10/2015   Melanoma (HCC)    melanoma removed from neck    Mild episode of recurrent major  depressive disorder (HCC) 08/27/2015   NEPHROLITHIASIS 10/15/2008   Qualifier: Diagnosis of   By: Rolan Hough         OA (osteoarthritis) of hip 12/18/2014   On home oxygen therapy    2L q hs (11/08/2012),  06/04/19 pt. not on oxygen   OSA (obstructive sleep apnea)    a. cpap noncompliance- patient reports on 12/10/14- does not use CPAP   Palpitations    a. 10/2012 - s/p LINQ loop recorder to assess for arrhythmia.    Pancreatic cyst 04/14/2018   Pancreatic pseudocyst 06/07/2019   Pancreatitis    Paroxysmal A-fib (HCC) 11/21/2008   a. S/P PVI 2/11 and 9/11   Paroxysmal atrial fibrillation (HCC) 01/14/2023   Pelvic fracture (HCC) 12/31/2019   Persistent atrial fibrillation (HCC) 06/08/2022   PICC (peripherally inserted central catheter) in place    Pneumonia    Protein-calorie malnutrition, severe 06/12/2019   Pseudocyst of pancreas 03/06/2019   Pure hypercholesterolemia    S/P coronary artery stent placement    SHORTNESS OF BREATH 03/19/2009   Qualifier: Diagnosis of   By: Kelsie MD, James         Sleep apnea 05/17/2009   Qualifier: Diagnosis of   By: Jude MD, Harden GAILS.         SVT (supraventricular tachycardia) (HCC)    a. Atypical AVNRT of the slow pathway   Thoracic aortic aneurysm (HCC) 11/13/2020   Type II diabetes mellitus (HCC)    Unstable angina (HCC) 10/15/2008   Qualifier: Diagnosis of   By: Rolan Hough          Medications:  Scheduled:   baclofen   10 mg Oral TID   Chlorhexidine  Gluconate Cloth  6 each Topical Daily   digoxin   0.125 mg Oral Daily   feeding supplement  237 mL Oral TID BM   feeding supplement (PROSource TF20)  60 mL Per Tube TID   folic acid   1 mg Per Tube Daily   gabapentin   300 mg Oral TID   guaiFENesin   600 mg Oral BID   influenza vaccine adjuvanted  0.5 mL Intramuscular Tomorrow-1000   insulin  aspart  0-15 Units Subcutaneous Q4H   lidocaine   1 patch Transdermal Q24H   methylPREDNISolone  (  SOLU-MEDROL ) injection  40 mg Intravenous  Daily   mometasone -formoterol   2 puff Inhalation BID   multivitamin with minerals  1 tablet Oral Daily   pantoprazole  (PROTONIX ) IV  40 mg Intravenous Q24H   revefenacin   175 mcg Nebulization Daily   sodium chloride  HYPERTONIC  4 mL Nebulization Daily   tamsulosin   0.4 mg Oral Daily   thiamine   100 mg Per Tube Daily   Infusions:   feeding supplement (VITAL 1.5 CAL) 40 mL/hr at 02/01/23 1000   heparin  1,600 Units/hr (02/01/23 1000)   meropenem  (MERREM ) IV 280 mL/hr at 02/01/23 1000   norepinephrine  (LEVOPHED ) Adult infusion 6 mcg/min (02/01/23 1000)   PRN: acetaminophen , albuterol , benzonatate , docusate sodium , fentaNYL  (SUBLIMAZE ) injection, lip balm, ondansetron  (ZOFRAN ) IV, oxyCODONE , phenol, polyethylene glycol  Assessment: Patient admitted for cardiogenic vs. Septic shock. On Eliquis  PTA for afib.  Last dose was 1/2 at 17:30. Will need heparin  levels and aPTT until they are correlating.   aPTT is slightly subtherapeutic at 65, heparin  is therapeutic at 0.51, on heparin  infusion at 1600 units/hr (close but not yet correlating). Hgb 8.6, plt 179. No infusion issues. Slight bleeding at IV sites. Drawn correctly.   Goal of Therapy:  Heparin  level 0.3-0.7 units/ml aPTT 66-102 seconds Monitor platelets by anticoagulation protocol: Yes   Plan:  Increase heparin  infusion at 1700 units/hr to get into goal range  Daily heparin  level and CBC  Thank you for allowing pharmacy to participate in this patient's care,  Suzen Sour, PharmD, BCCCP Clinical Pharmacist  Phone: [phone] 02/01/2023 10:40 AM  Please check AMION for all [facility] Pharmacy phone numbers After 10:00 PM, call Main Pharmacy [phone]

## 2023-02-01 NOTE — Progress Notes (Signed)
 RT added venturi mask at 8L, 40% with HHFNC 50L, 93% due to desat with mouth open. Per CCM request.

## 2023-02-01 NOTE — Progress Notes (Signed)
 NAME:  Steve Anthony, MRN:  [id number], DOB:  [birth year], LOS: 4 ADMISSION DATE:  01/28/2023, CONSULTATION DATE:  01/27/22 REFERRING MD:  Dr. Mannie, CHIEF COMPLAINT:  hypotension   History of Present Illness:   89 yoM with PMH significant for but not limited to COPD, OSA, HFrEF, CAD, HTN, PAF on eliquis , sick sinus syndrome s/p PPM 12/27/22, CKD, DMT2, GERD, cirrhosis, hyperthyroidism, pancreatitis, chronic back pain, melanoma, and IDA who initially presented 1/3 with progressive weakness, poor PO intake, cough, lower extremity edema, and dysuria.  Intermittently hypoxic and hypotensive requiring vasopressor support.  Admitted with concern for septic and possible cardiogenic shock.  AHF consulted.    Pertinent  Medical History   Past Medical History:  Diagnosis Date   Abnormal CT of the abdomen 05/27/2018   Abnormal MRI of abdomen 04/14/2018   Acute pancreatitis 02/20/2019   Anemia    Anxiety disorder 04/09/2015   Arthritis    Asthma    Atrial fibrillation (HCC) 11/21/2008   Qualifier: Diagnosis of   By: Waddell, MD, CODY Danelle Fallow        CAD (coronary artery disease)    a. S/P PCI mid LAD (vision stent) 08/31/2004;  b. repeat cath 6/11 - no progression of CAD. c. Cath 10/2012 given moderate CAD on cardiac CT - no change from prior cath.   Centrilobular emphysema (HCC) 10/08/2022   Chest CT 08/04/2022     Chest pain 12/16/2016   CHEST PAIN UNSPECIFIED 11/21/2008   Qualifier: Diagnosis of   By: Ardelia, EMT-P, Tara         CHF (congestive heart failure) (HCC), reduced LVEF 35 to 40% by TTE 11/19/2022, new decrease compared to prior echocardiogram 2022.  Likely tachycardia mediated in the setting of A-fib RVR 12/17/2022   Chronic respiratory failure with hypoxia (HCC) 02/24/2017   Chronic systolic CHF (congestive heart failure) (HCC) 01/14/2023   Cirrhosis of liver without ascites (HCC) 04/14/2018   CKD (chronic kidney disease), stage III (HCC) 02/20/2019   COPD (chronic obstructive  pulmonary disease) (HCC)    COPD without exacerbation (HCC) 05/15/2009   Qualifier: Diagnosis of   By: Jude MD, Harden GAILS.         COVID-19 virus infection    Cyst of nipple, right 11/03/2020   Degeneration of lumbar intervertebral disc 04/09/2015   Diverticulitis    Dyslipidemia    Essential hypertension 10/15/2008   Qualifier: Diagnosis of   By: Rolan Hough         Gallstones    GERD 10/15/2008   Qualifier: Diagnosis of   By: Rolan Hough         High risk medication use 04/09/2015   History of COVID-19 01/24/19 02/20/2019   History of kidney stones    History of pelvic fracture 01/01/2020   History of tattoo 08/23/2016   History of UTI 11/28/2019   Hypercoagulable state due to persistent atrial fibrillation (HCC) 03/26/2022   Hyperlipidemia 10/15/2008   Qualifier: Diagnosis of   By: Rolan Hough         Hypertension    Hyperthyroidism    HYPERTRIGLYCERIDEMIA 10/15/2008   Qualifier: Diagnosis of   By: Rolan Hough         Hypotension 06/13/2019   Idiopathic acute pancreatitis 04/14/2018   Incisional hernia, without obstruction or gangrene 02/05/2016   Infected pancreatic pseudocyst 06/07/2019   Insomnia 04/09/2015   Iron deficiency anemia 05/03/2022   Kidney mass 07/06/2017   Following with urology. Chao.  Recommend follow up.  Last seen 01/2018  Refuses to see again.   CT okay 04/2021     Long term (current) use of anticoagulants [Z79.01] 06/10/2015   Melanoma (HCC)    melanoma removed from neck    Mild episode of recurrent major depressive disorder (HCC) 08/27/2015   NEPHROLITHIASIS 10/15/2008   Qualifier: Diagnosis of   By: Rolan Hough         OA (osteoarthritis) of hip 12/18/2014   On home oxygen therapy    2L q hs (11/08/2012),  06/04/19 pt. not on oxygen   OSA (obstructive sleep apnea)    a. cpap noncompliance- patient reports on 12/10/14- does not use CPAP   Palpitations    a. 10/2012 - s/p LINQ loop recorder to assess for arrhythmia.    Pancreatic  cyst 04/14/2018   Pancreatic pseudocyst 06/07/2019   Pancreatitis    Paroxysmal A-fib (HCC) 11/21/2008   a. S/P PVI 2/11 and 9/11   Paroxysmal atrial fibrillation (HCC) 01/14/2023   Pelvic fracture (HCC) 12/31/2019   Persistent atrial fibrillation (HCC) 06/08/2022   PICC (peripherally inserted central catheter) in place    Pneumonia    Protein-calorie malnutrition, severe 06/12/2019   Pseudocyst of pancreas 03/06/2019   Pure hypercholesterolemia    S/P coronary artery stent placement    SHORTNESS OF BREATH 03/19/2009   Qualifier: Diagnosis of   By: Kelsie MD, James         Sleep apnea 05/17/2009   Qualifier: Diagnosis of   By: Jude MD, Harden GAILS.         SVT (supraventricular tachycardia) (HCC)    a. Atypical AVNRT of the slow pathway   Thoracic aortic aneurysm (HCC) 11/13/2020   Type II diabetes mellitus (HCC)    Unstable angina (HCC) 10/15/2008   Qualifier: Diagnosis of   By: Rolan Hough         [facility] Events: Including procedures, antibiotic start and stop dates in addition to other pertinent events   1/3 Admitted with shock 1/6 more hypoxic/ NRB, CXR increased multifocal infiltrates, ongoing NE, dobutamine  stopped  Interim History / Subjective:  Tmax 99.4 CVP 2, getting albumin  Hallucinating intermittently this am.  On HHFNC 50L/ 90% ~88-89% Short run of aflutter overnight  Objective   Blood pressure 99/64, pulse 76, temperature 98.6 F (37 C), temperature source Axillary, resp. rate 20, height 6' (1.829 m), weight 85.8 kg, SpO2 91%. CVP:  [3 mmHg-14 mmHg] 6 mmHg  FiO2 (%):  [90 %] 90 %   Intake/Output Summary (Last 24 hours) at 02/01/2023 1211 Last data filed at 02/01/2023 1000 Gross per 24 hour  Intake 2263.83 ml  Output 2000 ml  Net 263.83 ml   Filed Weights   01/30/23 0830 01/31/23 0500 02/01/23 0500  Weight: 84.5 kg 83 kg 85.8 kg    Examination: General:  AoC ill appearing elderly male sitting upright in bed  HEENT: MM pale/ dry, cortrak,  ecchymosis to right neck/ clavicle area Neuro:  resting but will easily respond to verbal, oriented to self, hospital and year, severe generalized weakness CV: paced  PULM:  tachypneic but not labored on HHFNC 50L/ 90%, sats >88% but better if closes mouth/ deep breathes, bilateral rales/ rhonchi R> L, diminished in bases GI: soft, bs hypo, NT, condom cath Extremities: warm/dry, +2-3 pedal edema, generalized edema/ UE, decreased muscle mass   Labs> K 3.9, sCr stable, glucose 306, phos 2.2, Mag 1.9,  WBC 15.4> 11.4, H/H stable 8.6/ 24.7, APTT 65 Coox 74.4  UOP 2L/  24hrs Net +1.7L  1/3 RVP +rhinovirus 1/3 BC> kleb pna 1/3 UC > multiple species  1/4 BC>   Resolved Hospital Problem list   AGMA/ lactic acidosis, previously on metformin , which may contribute to slow clearance Diarrhea-resolved  Assessment & Plan:   Shock, mixed septic and cardiogenic ESBL Klebsiella bacteremia AoC HFrEF and RV failure  Sick sinus syndrome s/p PPM 12/27/22 Afib s/p ablation - looks either junction vs apaced at times.  Rate regular P:  - cont to wean NE for MAP goal > 65 - cont meropenem  per pharmacy, improving fever curve and decreasing WBC, continue to monitor - follow repeat BC's - dobutamine  stopped 1/6 - trending coox/ CVP- holding diuretics with lower CVP, getting albumin  - appreciate AHF input - hemodynamic support - optimize electrolytes, K, Mag, check iCa   Acute respiratory failure with hypoxia COPD, no AE Rhinovirus infection Bilateral multifocal pneumonia, worsening 1/6.  Suspect component of aspiration given talking with daughter  - Suspect viral now bacterial pneumonia> was a likely source of his ESBL klebsiella bacteremia - severe deconditioning and malnutrition contributing to poor resp reserve/ resp failure  P:  - remains DNI.  No BiPAP per discussions with family.  Any further decline, would transition to comfort.   - keep NPO for now - cont HHFNC, add VM or NRB to keep  sats > 90% - cont HTS, BD, CPT - solumedrol added 1/6 - hold diuresis today - NPO, EN per cortrak - cont Bds, flutter/ IS as able - CXR in am  - droplet precautions   AKI, improved H/o urinary retention Dysuria- UA +leukocytes, WBC 21-50, UC w/ multiple species Hypophos - replete prn.  Recheck phos/ mag q12 - stable sCr/ renal function - monitor for urinary retention - trend renal indices, strict I/Os   Acute normocytic anemia/ hx IDA> suspect the acute drop in his hemoglobin is due to significant bruising after multiple recent falls. Coagulopathy/ elevated INR (on eliquis ) - CBC stable - cont heparin  IV per pharmacy, holding eliquis  - transfuse for Hb <7 or hemodynamically significant bleeding - defer iron infusion while septic   Recent falls and back pain> new T2 compression fracture. Old compression fracture T7 seen on CT Chronic LBP, multiple previous surgeries - con't PTA oxy, gabapentin , baclofen , prn fentanyl  for severe pain.  Family aware this may further compromise resp function but do not want him to suffer - lidocaine  patch, PRN tylenol    DMT2, hyperglycemia- controlled without insulin . Prior A1c 6.2 on 12/27/22, - CBG w/ SSI prn.  In 200's overnight, likely due to steroids   GERD - PPI   Hyperbilirubinemia likely due to sepsis, no evidence of biliary disease, improving - trend LFTs periodically, infectious tx as above   Severe protein calorie malnutrition, nearly 70 lb unintentional wt loss reported since 06/2022 Severe deconditioning, ?FTT, frequent falls - cortrak, TF per RD - NPO for now given concerns for aspiration   Vision loss PTA; hasn't been able to follow up with ophtho, family denies glaucoma, macular degeneration, diabetic retinopathy history.  Home blood sugars have been less than 200. - outpatient follow-up with ophthalmology. This will be a hard issue to fully assess and resolve as an IP.  Metabolic encephalopathy vs developing  delirium - 2/2 bacteremia, hypoxia, and steroids could be contributing, high risk for ICU delirium.   - delirium precautions   GOC - At admission pt verbalized wishes of DNR/ DNI but wishes for all other aggressive medical care and interventions including CVL/  pressors.  - 1/7 updated wife and daughter extensively.  Any further decline would transition to comfort care given his wishes given decompensation 1/6 despite aggressive medical treatment.  Do not recommend BiPAP and family states he would not want.  They do not want him to suffer and desire to honor his wishes.  Pt verbalized he was not ready yet to daughter.  Will continue current support for now.   PMT consulted to assist with ongoing pt/family support and GOC.    Addendum 1445:  Pt now transitioned to full comfort after meeting with PMT with daughter and wife at bedside per pt's wishes.  See PMT note, orders placed per PMT.  Greatly appreciate assistance.   Best Practice (right click and Reselect all SmartList Selections daily)   Diet/type: NPO DVT prophylaxis systemic heparin  Pressure ulcer(s): N/A> skin tear to R forearm POA GI prophylaxis: PPI Lines: Central line and Arterial Line Foley:  N/A Code Status:  DNR/ DNI per pt's expressed wishes[1/4]  Labs   CBC: Recent Labs  Lab 01/28/23 1302 01/28/23 2000 01/29/23 0456 01/29/23 1355 01/30/23 0415 01/31/23 0433 01/31/23 1200 02/01/23 0344  WBC 23.2*  --  23.7*  --  17.5* 15.8* 15.4* 11.4*  NEUTROABS 21.5*  --   --   --   --   --   --   --   HGB 8.6*   < > 8.5*  --  8.1* 8.5* 9.2* 8.6*  HCT 25.5*   < > 24.6*  --  22.8* 24.5* 26.6* 24.7*  MCV 95.1  --  91.4  --  90.1 90.7 91.4 90.8  PLT 204  --  203 192 191 181 196 178   < > = values in this interval not displayed.    Basic Metabolic Panel: Recent Labs  Lab 01/28/23 1302 01/28/23 2000 01/28/23 2023 01/29/23 0456 01/30/23 0415 01/31/23 0433 01/31/23 1200 01/31/23 1724 02/01/23 0344  NA 137  --  132* 135  137  --  134*  --  136  K 4.2  --  4.6 4.4 4.3  --  4.1  --  3.9  CL 96*  --   --  99 99  --  96*  --  95*  CO2 12*  --   --  13* 19*  --  20*  --  23  GLUCOSE 96  --   --  142* 134*  --  215*  --  306*  BUN 8  --   --  8 9  --  13  --  18  CREATININE 1.84*  --   --  1.52* 1.07  --  1.03  --  1.01  CALCIUM  7.6*  --   --  8.2* 8.1*  --  8.1*  --  8.4*  MG  --  1.1*  --  2.2  --  1.4*  --  1.9 1.8  PHOS  --   --   --  2.9  --   --   --  1.7* 2.2*   GFR: Estimated Creatinine Clearance: 73.6 mL/min (by C-G formula based on SCr of 1.01 mg/dL). Recent Labs  Lab 01/28/23 2000 01/29/23 0456 01/29/23 1355 01/30/23 0415 01/31/23 0433 01/31/23 1200 02/01/23 0344  PROCALCITON 4.79 3.53  --   --   --   --   --   WBC  --  23.7*  --  17.5* 15.8* 15.4* 11.4*  LATICACIDVEN 8.2* 6.9* 5.2* 1.9  --   --   --  Liver Function Tests: Recent Labs  Lab 01/28/23 1302 01/29/23 0456 01/30/23 0415 01/31/23 1200  AST 33 35 25 21  ALT 15 16 16 16   ALKPHOS 74 82 69 86  BILITOT 1.7* 2.1* 1.8* 1.7*  PROT 3.9* 4.3* 3.9* 4.2*  ALBUMIN  1.7* 1.8* 1.7* 1.7*   Recent Labs  Lab 01/28/23 2000  LIPASE 17     Critical care time: 50 mins       Lyle Pesa, MSN, AG-ACNP-BC [facility] 02/01/2023, 12:12 PM  See Amion for pager If no response to pager , please call [phone] until 7pm After 7:00 pm call Elink  [phone]

## 2023-02-01 NOTE — Consult Note (Signed)
 Palliative Medicine Inpatient Consult Note  Consulting Provider: Gretta Leita SQUIBB, DO   Reason for consult:   Palliative Care Consult Services Palliative Medicine Consult  Reason for Consult? FTT, getting worse this hospitalization   02/01/2023  HPI:  Per intake H&P --> 72 yoM with PMH significant for but not limited to COPD, OSA, HFrEF, CAD, HTN, PAF on eliquis , sick sinus syndrome s/p PPM 12/27/22, CKD, DMT2, GERD, cirrhosis, hyperthyroidism, pancreatitis, chronic back pain, melanoma, and IDA who initially presented to urgent care today with complaints of increasing generalized weakness over 2 weeks, poor PO intake, and cough found to be hypotensive with SBP in 60's, 89% on room air.  The Palliative care team has been asked to get involved to support additional goals of care conversations in the setting of septic + cardiogenic shock with worsening hypoxia.   Clinical Assessment/Goals of Care:  *Please note that this is a verbal dictation therefore any spelling or grammatical errors are due to the Dragon Medical One system interpretation.  I have reviewed medical records including EPIC notes, labs and imaging, received report from bedside RN, assessed the patient who is lying in bed on HFNC with a venti mask over his face.    I met with Mateen, his wife, Rock, his SIL, and his daughter, April.  to further discuss diagnosis prognosis, GOC, EOL wishes, disposition and options.   I introduced Palliative Medicine as specialized medical care for people living with serious illness. It focuses on providing relief from the symptoms and stress of a serious illness. The goal is to improve quality of life for both the patient and the family.  Medical History Review and Understanding:  A review of patients past medical history inclusive of HFrEF, CAD, COPD, T2DM, GERD, cirrhosis, PAF, CKD, and SSS s/p PM was completed.   Social History:  Jashon is from [place], [place] . He has lived here  throughout his whole life. He has a wife with whom he has been married for 41 years. He has a daughter, April and two grandsons ages 9 and 79. 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 He loved working on his farm and with his tactor. He is a man of the Christian faith.   Functional and Nutritional State:  Preceding hospitalization Jaivon has been on the decline since roughly July of this year. He was eating little in December post pacemaker. He had from there lost the ability to continue riding his tractor and working with his chickens. He became more dependent on other.   Advance Directives:  A detailed discussion was had today regarding advanced directives.  Patient has a living will on file and relies on his spouse, Rock, and daughter, April to be his surrogate decision makers.  Code Status:  Concepts specific to code status, artifical feeding and hydration, continued IV antibiotics and rehospitalization was had.  The difference between a aggressive medical intervention path  and a palliative comfort care path for this patient at this time was had.   Ulysses is an established DNAR/DNI code status.   Discussion:  We discussed Windle's clinical picture in the setting of his cardiogenic and septic shock. We reviewed that the medical team is offering the maximal support that this time though patient has lacking in improvements.  We reviewed that we can continue current measures though we will likely be in a similar situation again in a short period of time as Tory's history has shown  us . We reviewed the chronic disease trajectory in patients who have multiple co-morbidities. I shared that often an event will occur leading to an  acute hospitalizationsuch as a fall, UTI, PNA, heart failure exacerbation, copd exacerbation, or another illness sort. We discussed that patients may have been functioning at a high plateau initially, then an acute event  occurs. We discussed that after this event their function, mental, and nutritional states are compromised. Often with treatment and rehabilitation there is some regain in each individuals health, though often not to their prior baseline level. We discussed that then another event will occur causing a rehospitalization and a further decline. I shared that often this will become a pattern and each event causes greater burden to the individual, depleting them further or their function, cognition, or nutritional state.   We alternatively discussed focusing on comfort, dignity, and peace at the end of life. We talked about transition to comfort measures in house and what that would entail inclusive of medications to control pain, dyspnea, agitation, nausea, itching, and hiccups.  We discussed stopping all uneccessary measures such as cardiac monitoring, blood draws, needle sticks, and frequent vital signs.   Utilized reflective listening throughout our time together. Joey shares that this is what he wants. He emphasizes that he knows where he is going and feels ready for it.   Patient would like to see his pastor and two grandchildren though after I have explained to his family we will focus purely on keeping him comfortably. I shared with Davontae that his life will be quite limited which he understood.   Discussed the importance of continued conversation with family and their  medical providers regarding overall plan of care and treatment options, ensuring decisions are within the context of the patients values and GOCs.  Decision Maker: Rock Bars (wife)  [phone]  SUMMARY OF RECOMMENDATIONS   DNAR/DNI  Patient would like to focus on comfort care after seeing his pastor and two grandchildren  Comfort care orders have been entered inclusive of a fentanyl  gtt with boluses as needed  DC coretrack  Place tele on comfort setting  Unrestricted visitation  Ongoing PMT support  Code  Status/Advance Care Planning: DNAR/DNI  Palliative Prophylaxis:  Aspiration, Bowel Regimen, Delirium Protocol, Frequent Pain Assessment, Oral Care, Palliative Wound Care, and Turn Reposition  Additional Recommendations (Limitations, Scope, Preferences): Continue current care  Psycho-social/Spiritual:  Desire for further Chaplaincy support: Yes - they have their own pastor Additional Recommendations: Education on EOL processes   Prognosis: Hours  Discharge Planning: Discharge will be celestial Vitals:   02/01/23 1243 02/01/23 1325  BP:  127/64  Pulse: 73 77  Resp: 18 19  Temp:    SpO2: 90% 92%    Intake/Output Summary (Last 24 hours) at 02/01/2023 1357 Last data filed at 02/01/2023 1243 Gross per 24 hour  Intake 2278.41 ml  Output 2000 ml  Net 278.41 ml   Last Weight  Most recent update: 02/01/2023  5:54 AM    Weight  85.8 kg (189 lb 2.5 oz)            Gen:  Elderly Caucasian M chronically ill appearing HEENT: Coretrack, dry mucous membranes CV: Regular rate and rhythm  PULM:  On 50lpm HFNC and venturi mask ABD: soft/nontender  EXT: BLE edema  Neuro: Alert and oriented x3   PPS: 10%   This conversation/these recommendations were discussed with patient primary care team, Dr. Gretta and Vina Pesa, PA  Billing based on MDM: High  Problems Addressed: One acute or chronic illness or injury that poses a threat to life or bodily function  Amount and/or Complexity of Data: Category 3:Discussion of management or test interpretation with external physician/other qualified health care professional/appropriate source (not separately reported)  Risks: Decision not to resuscitate or to de-escalate care because of poor prognosis ______________________________________________________ Rosaline Becton [facility] Palliative Medicine Team Team Cell Phone: [phone] Please utilize secure chat with additional questions, if there is no response within 30 minutes please call  the above phone number  Palliative Medicine Team providers are available by phone from 7am to 7pm daily and can be reached through the team cell phone.  Should this patient require assistance outside of these hours, please call the patient's attending physician.

## 2023-02-01 NOTE — Progress Notes (Signed)
 While speaking with his daughter patient became emotional asking, "why is he here to take me".  Primary RN offered emotional support, and treated pain.

## 2023-02-01 NOTE — Progress Notes (Signed)
 Nutrition Brief Note  Chart reviewed. Pt now transitioning to comfort care.  No further nutrition interventions planned at this time.  Please re-consult as needed.   Romelle Starcher MS, RDN, LDN, CNSC Registered Dietitian 3 Clinical Nutrition RD Inpatient Contact Info in Amion

## 2023-02-01 NOTE — Progress Notes (Signed)
 ICD rep called, instructed to use magnet.

## 2023-02-01 NOTE — Progress Notes (Signed)
 Patient ID: Steve Anthony, male   DOB: [birth year], 73 y.o.   MRN: [id number]     Advanced Heart Failure Rounding Note  Cardiologist: None  Chief Complaint: Mixed septic & cardiogenic shock Subjective:    - On levophed  this AM with CVP of 2-3 - Very lethargic & weak on exam.  - 2L urine output yesterday   Objective:   Weight Range: 85.8 kg Body mass index is 25.65 kg/m.   Vital Signs:   Temp:  [98.7 F (37.1 C)-99.7 F (37.6 C)] 98.7 F (37.1 C) (01/07 0800) Pulse Rate:  [69-95] 73 (01/07 0900) Resp:  [11-24] 14 (01/07 0900) BP: (84-110)/(48-78) 106/67 (01/07 0900) SpO2:  [77 %-96 %] 89 % (01/07 0903) Arterial Line BP: (93-133)/(43-67) 124/60 (01/07 0900) FiO2 (%):  [90 %] 90 % (01/07 0901) Weight:  [85.8 kg] 85.8 kg (01/07 0500) Last BM Date : 01/31/23  Weight change: Filed Weights   01/30/23 0830 01/31/23 0500 02/01/23 0500  Weight: 84.5 kg 83 kg 85.8 kg    Intake/Output:   Intake/Output Summary (Last 24 hours) at 02/01/2023 1029 Last data filed at 02/01/2023 0800 Gross per 24 hour  Intake 1896.86 ml  Output 1800 ml  Net 96.86 ml     CVP 8 Physical Exam    General:  well appearing.  No respiratory difficulty HEENT: +NRB, +cortrak Neck: supple. JVD ~8. Carotids 2+ bilat; no bruits. No lymphadenopathy or thyromegaly appreciated. RIJ CVC Cor: PMI nondisplaced. Regular rate & rhythm. No rubs, gallops or murmurs. Lungs: clear Abdomen: soft, nontender, nondistended. No hepatosplenomegaly. No bruits or masses. Good bowel sounds. Extremities: no cyanosis, clubbing, rash, +1 BLE edema. LR a line Neuro: alert & oriented x 3, cranial nerves grossly intact. moves all 4 extremities w/o difficulty. Affect pleasant.   Telemetry   Paced, NSR 70s (Personally reviewed)    Labs    CBC Recent Labs    01/31/23 1200 02/01/23 0344  WBC 15.4* 11.4*  HGB 9.2* 8.6*  HCT 26.6* 24.7*  MCV 91.4 90.8  PLT 196 178   Basic Metabolic Panel Recent Labs     01/31/23 1200 01/31/23 1724 02/01/23 0344  NA 134*  --  136  K 4.1  --  3.9  CL 96*  --  95*  CO2 20*  --  23  GLUCOSE 215*  --  306*  BUN 13  --  18  CREATININE 1.03  --  1.01  CALCIUM  8.1*  --  8.4*  MG  --  1.9 1.8  PHOS  --  1.7* 2.2*   Liver Function Tests Recent Labs    01/30/23 0415 01/31/23 1200  AST 25 21  ALT 16 16  ALKPHOS 69 86  BILITOT 1.8* 1.7*  PROT 3.9* 4.2*  ALBUMIN  1.7* 1.7*    BNP: BNP (last 3 results) Recent Labs    01/13/23 1634 01/28/23 2000  BNP 721.6* 2,380.8*    ProBNP (last 3 results) No results for input(s): PROBNP in the last 8760 hours.   D-Dimer Recent Labs    01/29/23 1355  DDIMER <0.27   Imaging   DG Abd Portable 1V Result Date: 01/31/2023 CLINICAL DATA:  Feeding tube placement. EXAM: PORTABLE ABDOMEN - 1 VIEW COMPARISON:  CT 01/28/2023.  Radiographs 11/02/2010. FINDINGS: 1116 hours. Tip of the feeding tube projects over the L1 vertebral body, likely in the distal stomach. The visualized bowel gas pattern is nonobstructive. Patchy bibasilar pulmonary opacities and a cardiac pacemaker are noted. Patient is status post  lumbar fusion. IMPRESSION: Feeding tube tip projects over the distal stomach. Electronically Signed   By: Elsie Perone M.D.   On: 01/31/2023 11:47    Medications:   Scheduled Medications:  baclofen   10 mg Oral TID   Chlorhexidine  Gluconate Cloth  6 each Topical Daily   feeding supplement  237 mL Oral TID BM   feeding supplement (PROSource TF20)  60 mL Per Tube TID   folic acid   1 mg Per Tube Daily   gabapentin   300 mg Oral TID   guaiFENesin   600 mg Oral BID   influenza vaccine adjuvanted  0.5 mL Intramuscular Tomorrow-1000   insulin  aspart  0-15 Units Subcutaneous Q4H   lidocaine   1 patch Transdermal Q24H   methylPREDNISolone  (SOLU-MEDROL ) injection  40 mg Intravenous Daily   mometasone -formoterol   2 puff Inhalation BID   multivitamin with minerals  1 tablet Oral Daily   pantoprazole  (PROTONIX ) IV  40  mg Intravenous Q24H   revefenacin   175 mcg Nebulization Daily   sodium chloride  HYPERTONIC  4 mL Nebulization Daily   tamsulosin   0.4 mg Oral Daily   thiamine   100 mg Per Tube Daily    Infusions:  feeding supplement (VITAL 1.5 CAL) 40 mL/hr at 02/01/23 0800   heparin  1,600 Units/hr (02/01/23 0800)   meropenem  (MERREM ) IV 280 mL/hr at 02/01/23 0800   norepinephrine  (LEVOPHED ) Adult infusion 6 mcg/min (02/01/23 0800)    PRN Medications: acetaminophen , albuterol , benzonatate , docusate sodium , fentaNYL  (SUBLIMAZE ) injection, lip balm, ondansetron  (ZOFRAN ) IV, oxyCODONE , phenol, polyethylene glycol Assessment/Plan   1. Shock: Suspect mixed septic/cardiogenic shock (primarily septic).  WBCs 23, PCT 3.53.  Klebsiella in blood cultures.   Echo showed EF 60-65%, moderately dilated and moderately dysfunctional RV, dilated IVC.  This looks like primarily RV failure.   He has been on Eliquis , so PE less likely though not ruled out by the non-PE protocol chest CT.  - Lactic acid wnl; off dobutamine  now.  - Remains on levophed  ; CVP of 2. Can hold further diuresis from a cardiac standpoint; however, he does appear to have rhonchi.  - Albumin  today.  - Discussed with CCM. Likely has underlying viral PNA that is progressing.   2. RV failure: As above, echo showed EF 60-65%, moderately dilated and moderately dysfunctional RV, dilated IVC.  Primarily RV failure.  LV EF appears improved s/p AV nodal ablation and CRT-P placement in 12/24 (had been 35-40% on prior echo).    - dobutamine  discontinued.  - start digoxin  125mcg daily  3. Atrial fibrillation: Permanent, s/p AV nodal ablation and CRT-P.  - continue hep gtt.   4. H/o GI bleeding/anemia - Hgb stable at 8.6 today.   5. ID:  -CT chest/abd/pelvis with colitis noted.  Blood cultures growing Klebsiella, respiratory virus panel with rhinovirus.  - Patient has new CRT-P device. Klebsiella is only rare cause of endocarditis.  - WBC trending  down - continue meropenem ; CCM following. Completed vancomycin .   6. Colitis: Noted on CT abdomen/pelvis.  ?Ischemic colitis with hypotension.  Abdomen is not tender on exam.   7. AKI on CKD stage 3 - sCr now within normal limits   8. Chronic pancreatitis: Noted again on CT.   9. Back pain: Patient has chronic low back pain, on oxycodone  at home.  Now on Fentanyl  boluses.  Worsening pain while lying in bed and agitated.  - continue Fentanyl  25 mg     Length of Stay: 4  Steve Finamore, DO  02/01/2023, 10:29 AM  Advanced Heart Failure Team Pager [phone] (M-F; 7a - 5p)  Please contact [facility] for night-coverage after hours (5p -7a ) and weekends on [email or web address]  CRITICAL CARE Performed by: Ria Commander   Total critical care time: 36 minutes  Critical care time was exclusive of separately billable procedures and treating other patients.  Critical care was necessary to treat or prevent imminent or life-threatening deterioration.  Critical care was time spent personally by me on the following activities: development of treatment plan with patient and/or surrogate as well as nursing, discussions with consultants, evaluation of patient's response to treatment, examination of patient, obtaining history from patient or surrogate, ordering and performing treatments and interventions, ordering and review of laboratory studies, ordering and review of radiographic studies, pulse oximetry and re-evaluation of patient's condition.

## 2023-02-02 DIAGNOSIS — Z66 Do not resuscitate: Secondary | ICD-10-CM | POA: Diagnosis not present

## 2023-02-02 DIAGNOSIS — Z7189 Other specified counseling: Secondary | ICD-10-CM | POA: Diagnosis not present

## 2023-02-02 DIAGNOSIS — R6521 Severe sepsis with septic shock: Secondary | ICD-10-CM | POA: Diagnosis not present

## 2023-02-02 DIAGNOSIS — Z515 Encounter for palliative care: Secondary | ICD-10-CM | POA: Diagnosis not present

## 2023-02-02 DIAGNOSIS — A419 Sepsis, unspecified organism: Secondary | ICD-10-CM | POA: Diagnosis not present

## 2023-02-03 LAB — CULTURE, BLOOD (ROUTINE X 2)
Culture: NO GROWTH
Culture: NO GROWTH

## 2023-02-11 ENCOUNTER — Other Ambulatory Visit: Payer: Medicare HMO

## 2023-02-22 ENCOUNTER — Ambulatory Visit: Payer: Medicare HMO

## 2023-02-26 NOTE — Progress Notes (Signed)
 NAME:  Steve Anthony, MRN:  [id number], DOB:  22-May-1950, LOS: 5 ADMISSION DATE:  01/28/2023, CONSULTATION DATE:  01/27/22 REFERRING MD:  Dr. Mannie, CHIEF COMPLAINT:  hypotension   History of Present Illness:   34 yoM with PMH significant for but not limited to COPD, OSA, HFrEF, CAD, HTN, PAF on eliquis , sick sinus syndrome s/p PPM 12/27/22, CKD, DMT2, GERD, cirrhosis, hyperthyroidism, pancreatitis, chronic back pain, melanoma, and IDA who initially presented 1/3 with progressive weakness, poor PO intake, cough, lower extremity edema, and dysuria.  Intermittently hypoxic and hypotensive requiring vasopressor support.  Admitted with concern for septic and possible cardiogenic shock.  AHF consulted.    Pertinent  Medical History   Past Medical History:  Diagnosis Date   Abnormal CT of the abdomen 05/27/2018   Abnormal MRI of abdomen 04/14/2018   Acute pancreatitis 02/20/2019   Anemia    Anxiety disorder 04/09/2015   Arthritis    Asthma    Atrial fibrillation (HCC) 11/21/2008   Qualifier: Diagnosis of   By: Waddell, MD, CODY Danelle Fallow        CAD (coronary artery disease)    a. S/P PCI mid LAD (vision stent) 08/31/2004;  b. repeat cath 6/11 - no progression of CAD. c. Cath 10/2012 given moderate CAD on cardiac CT - no change from prior cath.   Centrilobular emphysema (HCC) 10/08/2022   Chest CT 08/04/2022     Chest pain 12/16/2016   CHEST PAIN UNSPECIFIED 11/21/2008   Qualifier: Diagnosis of   By: Ardelia, EMT-P, Tara         CHF (congestive heart failure) (HCC), reduced LVEF 35 to 40% by TTE 11/19/2022, new decrease compared to prior echocardiogram 2022.  Likely tachycardia mediated in the setting of A-fib RVR 12/17/2022   Chronic respiratory failure with hypoxia (HCC) 02/24/2017   Chronic systolic CHF (congestive heart failure) (HCC) 01/14/2023   Cirrhosis of liver without ascites (HCC) 04/14/2018   CKD (chronic kidney disease), stage III (HCC) 02/20/2019   COPD (chronic obstructive  pulmonary disease) (HCC)    COPD without exacerbation (HCC) 05/15/2009   Qualifier: Diagnosis of   By: Jude MD, Harden GAILS.         COVID-19 virus infection    Cyst of nipple, right 11/03/2020   Degeneration of lumbar intervertebral disc 04/09/2015   Diverticulitis    Dyslipidemia    Essential hypertension 10/15/2008   Qualifier: Diagnosis of   By: Rolan Hough         Gallstones    GERD 10/15/2008   Qualifier: Diagnosis of   By: Rolan Hough         High risk medication use 04/09/2015   History of COVID-19 01/24/19 02/20/2019   History of kidney stones    History of pelvic fracture 01/01/2020   History of tattoo 08/23/2016   History of UTI 11/28/2019   Hypercoagulable state due to persistent atrial fibrillation (HCC) 03/26/2022   Hyperlipidemia 10/15/2008   Qualifier: Diagnosis of   By: Rolan Hough         Hypertension    Hyperthyroidism    HYPERTRIGLYCERIDEMIA 10/15/2008   Qualifier: Diagnosis of   By: Rolan Hough         Hypotension 06/13/2019   Idiopathic acute pancreatitis 04/14/2018   Incisional hernia, without obstruction or gangrene 02/05/2016   Infected pancreatic pseudocyst 06/07/2019   Insomnia 04/09/2015   Iron deficiency anemia 05/03/2022   Kidney mass 07/06/2017   Following with urology. Chao.  Recommend follow up.  Last seen 01/2018  Refuses to see again.   CT okay 04/2021     Long term (current) use of anticoagulants [Z79.01] 06/10/2015   Melanoma (HCC)    melanoma removed from neck    Mild episode of recurrent major depressive disorder (HCC) 08/27/2015   NEPHROLITHIASIS 10/15/2008   Qualifier: Diagnosis of   By: Rolan Hough         OA (osteoarthritis) of hip 12/18/2014   On home oxygen therapy    2L q hs (11/08/2012),  06/04/19 pt. not on oxygen   OSA (obstructive sleep apnea)    a. cpap noncompliance- patient reports on 12/10/14- does not use CPAP   Palpitations    a. 10/2012 - s/p LINQ loop recorder to assess for arrhythmia.    Pancreatic  cyst 04/14/2018   Pancreatic pseudocyst 06/07/2019   Pancreatitis    Paroxysmal A-fib (HCC) 11/21/2008   a. S/P PVI 2/11 and 9/11   Paroxysmal atrial fibrillation (HCC) 01/14/2023   Pelvic fracture (HCC) 12/31/2019   Persistent atrial fibrillation (HCC) 06/08/2022   PICC (peripherally inserted central catheter) in place    Pneumonia    Protein-calorie malnutrition, severe 06/12/2019   Pseudocyst of pancreas 03/06/2019   Pure hypercholesterolemia    S/P coronary artery stent placement    SHORTNESS OF BREATH 03/19/2009   Qualifier: Diagnosis of   By: Kelsie MD, James         Sleep apnea 05/17/2009   Qualifier: Diagnosis of   By: Jude MD, Harden GAILS.         SVT (supraventricular tachycardia) (HCC)    a. Atypical AVNRT of the slow pathway   Thoracic aortic aneurysm (HCC) 11/13/2020   Type II diabetes mellitus (HCC)    Unstable angina (HCC) 10/15/2008   Qualifier: Diagnosis of   By: Rolan Hough         [facility] Events: Including procedures, antibiotic start and stop dates in addition to other pertinent events   1/3 Admitted with shock 1/6 more hypoxic/ NRB, CXR increased multifocal infiltrates, ongoing NE, dobutamine  stopped 1/7 transition to comfort cre  Interim History / Subjective:  On fentanyl  infusion. Comfortable. Family at bedside. SpO2 32.  Objective   Blood pressure (!) 85/57, pulse 73, temperature 98.6 F (37 C), temperature source Axillary, resp. rate 13, height 6' (1.829 m), weight 85.8 kg, SpO2 (!) 34%. CVP:  [3 mmHg-9 mmHg] 6 mmHg  FiO2 (%):  [90 %-93 %] 93 %   Intake/Output Summary (Last 24 hours) at February 12, 2023 0954 Last data filed at February 12, 2023 0800 Gross per 24 hour  Intake 1485.21 ml  Output 400 ml  Net 1085.21 ml   Filed Weights   01/30/23 0830 01/31/23 0500 02/01/23 0500  Weight: 84.5 kg 83 kg 85.8 kg    Examination: Exam deferred as multiple family members in room visiting with pt as part of saying goodbyes. Per RN, he is comfortable  on Fentanyl  infusion.   Resolved Hospital Problem list   AGMA/ lactic acidosis, previously on metformin , which may contribute to slow clearance Diarrhea-resolved  Assessment & Plan:   Shock, mixed septic and cardiogenic ESBL Klebsiella bacteremia AoC HFrEF and RV failure  Sick sinus syndrome s/p PPM 12/27/22 Afib s/p ablation - looks either junction vs apaced at times.  Acute respiratory failure with hypoxia COPD, no AE Rhinovirus infection Bilateral multifocal pneumonia, worsening 1/6.  Suspect component of aspiration given talking with daughter  Acute normocytic anemia/ hx IDA> suspect the acute drop in his hemoglobin is  due to significant bruising after multiple recent falls. Coagulopathy/ elevated INR (on eliquis ) AKI H/o urinary retention Dysuria- UA +leukocytes, WBC 21-50, UC w/ multiple species Recent falls and back pain> new T2 compression fracture. Old compression fracture T7 seen on CT Chronic LBP, multiple previous surgeries DMT2, hyperglycemia- controlled without insulin . Prior A1c 6.2 on 12/27/22 Hyperbilirubinemia likely due to sepsis, no evidence of biliary disease, improving Severe protein calorie malnutrition, nearly 70 lb unintentional wt loss reported since 06/2022 Severe deconditioning, ?FTT, frequent falls Vision loss PTA; hasn't been able to follow up with ophtho, family denies glaucoma, macular degeneration, diabetic retinopathy history.  Home blood sugars have been less than 200. DNR/DNI with transition to comfort measures 02/01/23.  Plan - Full comfort care - Continue Fentanyl  infusion, Glycopyrrolate , Lorazepam  - Unrestricted visitation - Anticipate in hospital death today   Best Practice (right click and Reselect all SmartList Selections daily)   Diet/type: NPO DVT prophylaxis not indicated Pressure ulcer(s): N/A> skin tear to R forearm POA GI prophylaxis: N/A Lines: N/A Foley:  Yes, and it is still needed Code Status:  DNR/ DNI per pt's  expressed wishes[1/4]. Full comfort care 02/01/23   Sammi Gore, PA - [facility] For pager details, please see AMION or use Epic chat  After 1900, please call [facility] for cross coverage needs Mar 02, 2023, 10:01 AM

## 2023-02-26 NOTE — Progress Notes (Signed)
   Palliative Medicine Inpatient Follow Up Note HPI: 85 yoM with PMH significant for but not limited to COPD, OSA, HFrEF, CAD, HTN, PAF on eliquis , sick sinus syndrome s/p PPM 12/27/22, CKD, DMT2, GERD, cirrhosis, hyperthyroidism, pancreatitis, chronic back pain, melanoma, and IDA who initially presented to urgent care today with complaints of increasing generalized weakness over 2 weeks, poor PO intake, and cough found to be hypotensive with SBP in 60's, 89% on room air.  The Palliative care team has been asked to get involved to support additional goals of care conversations in the setting of septic + cardiogenic shock with worsening hypoxia.   Today's Discussion 03-Mar-2023  *Please note that this is a verbal dictation therefore any spelling or grammatical errors are due to the Dragon Medical One system interpretation.  Chart reviewed inclusive of vital signs, progress notes, laboratory results, and diagnostic images.   Spoke to patients RN, Steve Anthony. She shares patient is comfortable on present regiment on Fentanyl .  I met with Steve Anthony this morning. He is resting comfortably in NAD. His distal extremities are cool and his O2 sats are in the 30%'s. I met with Steve Anthony's wife and sister in law. They reflect on his life and the type of man he was. They account for how strong of a person he is. We reviewed the importance of him in their lives.   Created space and opportunity for family to explore thoughts feelings and fears regarding Steve Anthony's current medical situation. We discussed that he had lost his brother once year ago and his mother just a few short weeks ago. The stress and sadness associated with those loses likely contributed to his decline.   Questions and concerns addressed/Palliative Support Provided.   Objective Assessment: Vital Signs Vitals:   March 03, 2023 0600 Mar 03, 2023 0700  BP:    Pulse: 73 73  Resp: 15 14  Temp:    SpO2: (!) 35% (!) 30%    Intake/Output Summary (Last 24 hours) at  2023/03/03 9075 Last data filed at 03-Mar-2023 0800 Gross per 24 hour  Intake 1485.21 ml  Output 400 ml  Net 1085.21 ml   Last Weight  Most recent update: 02/01/2023  5:54 AM    Weight  85.8 kg (189 lb 2.5 oz)            Gen:  Elderly Caucasian M chronically ill appearing HEENT: Dry mucous membranes CV: Regular rate and rhythm  PULM:  On RA, breathing is even and nonlabored ABD: soft/nontender  EXT: BLE edema  Neuro: Somnolent  SUMMARY OF RECOMMENDATIONS   DNAR/DNI   Comfort Care  Fentanyl  gtt with boluses as needed  Additional comfort medications per [facility]   Unrestricted visitation  Anticipate in hospital death today   Ongoing PMT support  Billing based on MDM: High ______________________________________________________________________________________ Steve Anthony Palliative Medicine Team Team Cell Phone: [phone] Please utilize secure chat with additional questions, if there is no response within 30 minutes please call the above phone number  Palliative Medicine Team providers are available by phone from 7am to 7pm daily and can be reached through the team cell phone.  Should this patient require assistance outside of these hours, please call the patient's attending physician.

## 2023-02-26 NOTE — Death Summary Note (Signed)
 DEATH SUMMARY   Patient Details  Name: Steve Anthony MRN: [id number] DOB: 01-03-1951  Admission/Discharge Information   Admit Date:  12-Feb-2023  Date of Death: Date of Death: 02/17/23  Time of Death: Time of Death: 15-Jan-1110  Length of Stay: 5  Referring Physician: Thurmond Cathlyn LABOR., MD   Reason(s) for Hospitalization   septic shock  Diagnoses  Preliminary cause of death:  Secondary Diagnoses (including complications and co-morbidities):  Active Problems:   Hypotension   Acute respiratory failure with hypoxia (HCC)   Rhinovirus   Bacteremia due to Klebsiella pneumoniae Shock, mixed septic and cardiogenic ESBL Klebsiella bacteremia AoC HFrEF and RV failure  Sick sinus syndrome s/p PPM 12/27/22 Afib s/p ablation - looks either junction vs apaced at times.  Acute respiratory failure with hypoxia COPD, no AE Rhinovirus infection Bilateral multifocal pneumonia, worsening 1/6.  Suspect component of aspiration given talking with daughter  Acute normocytic anemia/ hx IDA> suspect the acute drop in his hemoglobin is due to significant bruising after multiple recent falls. Coagulopathy/ elevated INR (on eliquis ) AKI H/o urinary retention Dysuria- UA +leukocytes, WBC 21-50, UC w/ multiple species Recent falls and back pain> new T2 compression fracture. Old compression fracture T7 seen on CT Chronic LBP, multiple previous surgeries DMT2, hyperglycemia- controlled without insulin . Prior A1c 6.2 on 12/27/22 Hyperbilirubinemia likely due to sepsis, no evidence of biliary disease, improving Severe protein calorie malnutrition, nearly 70 lb unintentional wt loss reported since 06/2022 Severe deconditioning, ?FTT, frequent falls Vision loss PTA; hasn't been able to follow up with ophtho, family denies glaucoma, macular degeneration, diabetic retinopathy history.  Home blood sugars have been less than 200. DNR/DNI with transition to comfort measures 02/01/23.       Brief Hospital Course  (including significant findings, care, treatment, and services provided and events leading to death)  Steve Anthony is a 73 y.o. year old male  with PMH significant for but not limited to COPD, OSA, HFrEF, CAD, HTN, PAF on eliquis , sick sinus syndrome s/p PPM 12/27/22, CKD, DMT2, GERD, cirrhosis, hyperthyroidism, pancreatitis, chronic back pain, melanoma, and IDA who initially presented 1/3 with progressive weakness, poor PO intake, cough, lower extremity edema, and dysuria.  Intermittently hypoxic and hypotensive requiring vasopressor support.  Admitted with concern for septic and possible cardiogenic shock.  AHF consulted.    Although he was continued on antibiotics blood cultures returned for resistant Klebsiella pneumonia and antibiotics were changed to the day after admission.  Repeat blood cultures remain negative.  Cardiogenic and septic shock was managed with inotropes and vasopressors.  Eventually inotropes or able be weaned off with decreasing vasopressor support.  His atrial fibrillation was supported with heparin  rather than DOAC given drop in hemoglobin prior to admission.  Initial AKI improved with management of other medical conditions.  Unfortunately despite his shock and renal failure improving he developed worsening respiratory failure, likely due to progressive Klebsiella pneumonia and rhinovirus pneumonia.  He eventually elected for withdrawal of aggressive support and passed with family at bedside.    Pertinent Labs and Studies  Significant Diagnostic Studies DG Abd Portable 1V Result Date: 01/31/2023 CLINICAL DATA:  Feeding tube placement. EXAM: PORTABLE ABDOMEN - 1 VIEW COMPARISON:  CT 02/12/23.  Radiographs 11/02/2010. FINDINGS: 1116 hours. Tip of the feeding tube projects over the L1 vertebral body, likely in the distal stomach. The visualized bowel gas pattern is nonobstructive. Patchy bibasilar pulmonary opacities and a cardiac pacemaker are noted. Patient is status post  lumbar fusion. IMPRESSION: Feeding tube tip  projects over the distal stomach. Electronically Signed   By: Elsie Perone M.D.   On: 01/31/2023 11:47   DG CHEST PORT 1 VIEW Result Date: 01/31/2023 CLINICAL DATA:  200808 Hypoxia [id number] EXAM: PORTABLE CHEST 1 VIEW COMPARISON:  CXR 01/28/23 FINDINGS: Right-sided central venous catheter in place with the tip near the cavoatrial junction. Left-sided dual lead cardiac device with unchanged lead positioning. New/increased small bilateral pleural effusions, right-greater-than-left. Compared to prior exam there is interval increase in hazy multifocal bilateral airspace opacities could represent pulmonary edema or multifocal infection. No radiographically apparent displaced rib fractures. Visualized upper abdomen is unremarkable. IMPRESSION: 1. New/increased small bilateral pleural effusions, right-greater-than-left. 2. Compared to prior exam there is interval increase in hazy multifocal bilateral airspace opacities could represent pulmonary edema or multifocal infection. Electronically Signed   By: Lyndall Gore M.D.   On: 01/31/2023 08:23   CT CERVICAL SPINE WO CONTRAST Result Date: 01/29/2023 CLINICAL DATA:  Upper thoracic back pain.  Recent fall. EXAM: CT CERVICAL SPINE WITHOUT CONTRAST TECHNIQUE: Multidetector CT imaging of the cervical spine was performed without intravenous contrast. Multiplanar CT image reconstructions were also generated. RADIATION DOSE REDUCTION: This exam was performed according to the departmental dose-optimization program which includes automated exposure control, adjustment of the mA and/or kV according to patient size and/or use of iterative reconstruction technique. COMPARISON:  CT chest 01/13/2023 FINDINGS: Alignment: Trace anterolisthesis of C3 on C4. Skull base and vertebrae: No acute fracture or suspicious osseous lesion. Slight chronic anterior wedging of the C7 vertebral body, unchanged. Soft tissues and spinal canal: No prevertebral  fluid or swelling. No visible canal hematoma. Disc levels: Mild cervical spondylosis. Asymmetric left-sided upper cervical facet arthrosis. No evidence of high-grade stenosis. Upper chest: See separately reported thoracic spine CT. Other: Partially visualized right internal jugular central venous catheter. Atherosclerotic calcification at the left greater than right carotid bifurcations. IMPRESSION: No acute cervical spine fracture. Electronically Signed   By: Dasie Hamburg M.D.   On: 01/29/2023 17:18   CT THORACIC SPINE WO CONTRAST Result Date: 01/29/2023 CLINICAL DATA:  Upper thoracic back pain.  Recent fall. EXAM: CT THORACIC SPINE WITHOUT CONTRAST TECHNIQUE: Multidetector CT images of the thoracic were obtained using the standard protocol without intravenous contrast. RADIATION DOSE REDUCTION: This exam was performed according to the departmental dose-optimization program which includes automated exposure control, adjustment of the mA and/or kV according to patient size and/or use of iterative reconstruction technique. COMPARISON:  CT chest 01/28/2023 and 01/13/2023 FINDINGS: Alignment: Normal. Vertebrae: Mild T2 superior endplate compression fracture with 20% vertebral body height loss which reflects a change from 01/13/2023 and has an acute appearance. Unchanged mild chronic T7 compression fracture. No suspicious bone lesion. Paraspinal and other soft tissues: Emphysema. Small bilateral pleural effusions. Partially visualized patchy bilateral lung opacities which appear increased from yesterday's CT in the posterior right upper lobe and new in the left upper lobe. Aortic and coronary atherosclerosis. Cholecystectomy. Disc levels: Spondylosis with prominent bridging anterior vertebral osteophytes throughout the mid and lower thoracic spine. No evidence of high-grade spinal stenosis. IMPRESSION: 1. Acute mild T2 compression fracture. 2. Chronic T7 compression fracture. 3. Partially visualized progressive patchy  bilateral lung opacities concerning for pneumonia. 4. Small bilateral pleural effusions. 5.  Aortic Atherosclerosis (ICD10-I70.0). Electronically Signed   By: Dasie Hamburg M.D.   On: 01/29/2023 17:14   DG CHEST PORT 1 VIEW Result Date: 01/28/2023 CLINICAL DATA:  Central line placement EXAM: PORTABLE CHEST 1 VIEW COMPARISON:  Chest x-ray 01/15/2023 FINDINGS: Right-sided central  venous catheter tip ends in the proximal SVC. Left-sided pacemaker is unchanged. The heart is mildly enlarged, unchanged. There is no focal lung infiltrate, pleural effusion or pneumothorax. The osseous structures are stable. IMPRESSION: Right-sided central venous catheter tip ends in the proximal SVC. No pneumothorax. Electronically Signed   By: Greig Pique M.D.   On: 01/28/2023 21:07   ECHOCARDIOGRAM COMPLETE Result Date: 01/28/2023    ECHOCARDIOGRAM REPORT   Patient Name:   HEATH TESLER Date of Exam: 01/28/2023 Medical Rec #:  [id number]      Height:       72.0 in Accession #:    [id number]     Weight:       192.4 lb Date of Birth:  Feb 26, 1950      BSA:          2.096 m Patient Age:    72 years       BP:           102/67 mmHg Patient Gender: M              HR:           78 bpm. Exam Location:  Inpatient Procedure: 2D Echo, Color Doppler and Cardiac Doppler STAT ECHO Indications:    acute systolic chf  History:        Patient has prior history of Echocardiogram examinations, most                 recent 11/19/2022. CHF, CAD, Pacemaker, COPD and chronic kidney                 disease, Arrythmias:Atrial Fibrillation; Risk Factors:Former                 Smoker, Sleep Apnea, Hypertension and Dyslipidemia.  Sonographer:    Tinnie Barefoot RDCS Referring Phys: [id number] RAVI AGARWALA IMPRESSIONS  1. Left ventricular ejection fraction, by estimation, is 60 to 65%. The left ventricle has normal function. The left ventricle has no regional wall motion abnormalities. Left ventricular diastolic parameters are indeterminate.  2. Right ventricular  systolic function is moderately reduced. The right ventricular size is moderately enlarged. There is severely elevated pulmonary artery systolic pressure. The estimated right ventricular systolic pressure is 60.7 mmHg.  3. Left atrial size was moderately dilated.  4. Right atrial size was moderately dilated.  5. The mitral valve is normal in structure. Trivial mitral valve regurgitation. No evidence of mitral stenosis.  6. The aortic valve is tricuspid. Aortic valve regurgitation is not visualized. No aortic stenosis is present.  7. Aortic dilatation noted. There is mild dilatation of the ascending aorta, measuring 40 mm.  8. The inferior vena cava is dilated in size with <50% respiratory variability, suggesting right atrial pressure of 15 mmHg. FINDINGS  Left Ventricle: Left ventricular ejection fraction, by estimation, is 60 to 65%. The left ventricle has normal function. The left ventricle has no regional wall motion abnormalities. The left ventricular internal cavity size was normal in size. There is  no left ventricular hypertrophy. Left ventricular diastolic parameters are indeterminate. Right Ventricle: The right ventricular size is moderately enlarged. No increase in right ventricular wall thickness. Right ventricular systolic function is moderately reduced. There is severely elevated pulmonary artery systolic pressure. The tricuspid regurgitant velocity is 3.38 m/s, and with an assumed right atrial pressure of 15 mmHg, the estimated right ventricular systolic pressure is 60.7 mmHg. Left Atrium: Left atrial size was moderately dilated. Right Atrium: Right atrial size was moderately  dilated. Pericardium: There is no evidence of pericardial effusion. Mitral Valve: The mitral valve is normal in structure. There is mild calcification of the mitral valve leaflet(s). Mild mitral annular calcification. Trivial mitral valve regurgitation. No evidence of mitral valve stenosis. Tricuspid Valve: The tricuspid valve is  normal in structure. Tricuspid valve regurgitation is mild. Aortic Valve: The aortic valve is tricuspid. Aortic valve regurgitation is not visualized. No aortic stenosis is present. Pulmonic Valve: The pulmonic valve was normal in structure. Pulmonic valve regurgitation is not visualized. Aorta: Aortic dilatation noted. There is mild dilatation of the ascending aorta, measuring 40 mm. Venous: The inferior vena cava is dilated in size with less than 50% respiratory variability, suggesting right atrial pressure of 15 mmHg. IAS/Shunts: No atrial level shunt detected by color flow Doppler. Additional Comments: A device lead is visualized in the right ventricle.  LEFT VENTRICLE PLAX 2D LVIDd:         5.50 cm   Diastology LVIDs:         3.10 cm   LV e' medial:    12.40 cm/s LV PW:         0.80 cm   LV E/e' medial:  8.8 LV IVS:        1.10 cm   LV e' lateral:   17.30 cm/s LVOT diam:     2.20 cm   LV E/e' lateral: 6.3 LV SV:         72 LV SV Index:   34 LVOT Area:     3.80 cm  RIGHT VENTRICLE             IVC RV Basal diam:  3.40 cm     IVC diam: 2.60 cm RV S prime:     19.90 cm/s TAPSE (M-mode): 1.4 cm LEFT ATRIUM             Index        RIGHT ATRIUM           Index LA Vol (A2C):   97.6 ml 46.57 ml/m  RA Area:     28.80 cm LA Vol (A4C):   96.3 ml 45.95 ml/m  RA Volume:   97.00 ml  46.28 ml/m LA Biplane Vol: 97.7 ml 46.62 ml/m  AORTIC VALVE LVOT Vmax:   119.00 cm/s LVOT Vmean:  80.900 cm/s LVOT VTI:    0.190 m  AORTA Ao Root diam: 4.00 cm Ao Asc diam:  4.00 cm MITRAL VALVE                TRICUSPID VALVE MV Area (PHT): 3.65 cm     TR Peak grad:   45.7 mmHg MV Decel Time: 208 msec     TR Vmax:        338.00 cm/s MV E velocity: 109.00 cm/s MV A velocity: 28.70 cm/s   SHUNTS MV E/A ratio:  3.80         Systemic VTI:  0.19 m                             Systemic Diam: 2.20 cm Dalton McleanMD Electronically signed by Ezra Kanner Signature Date/Time: 01/28/2023/6:12:35 PM    Final    CT CHEST ABDOMEN PELVIS W  CONTRAST Result Date: 01/28/2023 CLINICAL DATA:  Weakness over the last 2 weeks with fall a few days ago. Head injury. Poly trauma blunt. EXAM: CT CHEST, ABDOMEN, AND PELVIS WITH CONTRAST TECHNIQUE: Multidetector CT imaging of  the chest, abdomen and pelvis was performed following the standard protocol during bolus administration of intravenous contrast. RADIATION DOSE REDUCTION: This exam was performed according to the departmental dose-optimization program which includes automated exposure control, adjustment of the mA and/or kV according to patient size and/or use of iterative reconstruction technique. CONTRAST:  60mL OMNIPAQUE  IOHEXOL  350 MG/ML SOLN COMPARISON:  Prior CTs of the chest, abdomen and pelvis 01/13/2023. FINDINGS: CT CHEST FINDINGS Cardiovascular: No acute vascular findings are seen. Left subclavian pacemaker leads extend into the right atrium, right ventricle and coronary sinus. There is atherosclerosis of the aorta, great vessels and coronary arteries. The heart size is normal. There is no pericardial effusion. Mediastinum/Nodes: There are no enlarged mediastinal, hilar or axillary lymph nodes. Small mediastinal lymph nodes appear unchanged. The thyroid gland, trachea and esophagus demonstrate no significant findings. Lungs/Pleura: Small bilateral pleural effusions have mildly increased since the previous CT. There is no evidence of pneumothorax. There is increased central airway thickening and dependent airspace opacities in both lungs which could reflect atelectasis or early pneumonia. Underlying mild to moderate centrilobular emphysema noted. Musculoskeletal/Chest wall: No chest wall mass or suspicious osseous findings. No evidence of acute fracture. Grossly stable mild superior endplate compression deformities at T2 and T7. Multilevel spondylosis. CT ABDOMEN AND PELVIS FINDINGS Hepatobiliary: Decreased density throughout the liver consistent with steatosis. No focal lesion or abnormal  enhancement identified. No evidence of significant biliary dilatation status post cholecystectomy. Pancreas: Chronic calcifications throughout the pancreatic head and proximal body consistent with chronic calcific pancreatitis. There is marked atrophy of the pancreatic body and tail. Spleen: Status post splenectomy. Adrenals/Urinary Tract: Both adrenal glands appear normal. No evidence of urinary tract calculus, suspicious renal lesion or hydronephrosis. Unchanged small renal cysts bilaterally for which no specific follow-up imaging is recommended. The bladder appears unremarkable for its degree of distention, although is partially obscured by artifact from the right hip arthroplasty. Stomach/Bowel: No enteric contrast administered. The stomach appears unremarkable for its degree of distention. No small bowel distension, wall thickening or surrounding inflammation. The appendix appears normal. Possible new wall thickening involving the cecum and proximal transverse colon. There are postsurgical changes consistent with previous partial left colectomy. Prominent stool throughout the colon. No evidence of bowel obstruction. Vascular/Lymphatic: There are no enlarged abdominal or pelvic lymph nodes. Aortic and branch vessel atherosclerosis without evidence of aneurysm or large vessel occlusion. Reproductive: The prostate gland and seminal vesicles appear unremarkable, although remain partially obscured by artifact from the right total hip arthroplasty. Other: No evidence of abdominal wall mass or hernia. No ascites or pneumoperitoneum. Mild generalized subcutaneous edema has developed. Musculoskeletal: No acute or significant osseous findings. Previous right total hip arthroplasty and lower lumbar fusion. Multilevel spondylosis and old pubic rami fractures bilaterally. IMPRESSION: 1. No evidence of acute traumatic injury within the chest, abdomen or pelvis. 2. Increased central airway thickening and dependent airspace  opacities in both lungs which could reflect atelectasis or early pneumonia. Small bilateral pleural effusions have mildly increased since the previous CT. 3. Possible new wall thickening involving the cecum and proximal transverse colon, possibly colitis. Correlate clinically. No evidence of bowel obstruction. 4. Chronic calcific pancreatitis. 5. Hepatic steatosis. 6. Aortic Atherosclerosis (ICD10-I70.0) and Emphysema (ICD10-J43.9). Electronically Signed   By: Elsie Perone M.D.   On: 01/28/2023 17:53   CT Head Wo Contrast Result Date: 01/28/2023 CLINICAL DATA:  Ataxia head injury.  Trauma. EXAM: CT HEAD WITHOUT CONTRAST TECHNIQUE: Contiguous axial images were obtained from the base of the skull  through the vertex without intravenous contrast. RADIATION DOSE REDUCTION: This exam was performed according to the departmental dose-optimization program which includes automated exposure control, adjustment of the mA and/or kV according to patient size and/or use of iterative reconstruction technique. COMPARISON:  12/31/2019 FINDINGS: Brain: No evidence of acute infarction, hemorrhage, hydrocephalus, extra-axial collection or mass lesion/mass effect. Vascular: No hyperdense vessel or unexpected calcification. Skull: Normal. Negative for fracture or focal lesion. Sinuses/Orbits: Mastoid air cells are clear. Mucosal thickening is noted involving the right maxillary sinus. No sinus fluid levels. Other: None. IMPRESSION: 1. No acute intracranial abnormalities. 2. Right maxillary sinus disease. Electronically Signed   By: Waddell Calk M.D.   On: 01/28/2023 17:40   DG Chest Port 1 View Result Date: 01/28/2023 CLINICAL DATA:  Questionable sepsis - evaluate for abnormality. EXAM: PORTABLE CHEST 1 VIEW COMPARISON:  01/25/2023. FINDINGS: There is mild pulmonary vascular congestion. There is left retrocardiac opacity partially obscuring the left medial hemidiaphragm and descending thoracic aorta, favoring combination of left  lung atelectasis and/or consolidation. Bilateral lung fields are otherwise clear. No acute consolidation or lung collapse. Bilateral costophrenic angles are clear. Stable cardio-mediastinal silhouette. There is a left sided 2-lead pacemaker. No acute osseous abnormalities. The soft tissues are within normal limits. IMPRESSION: *Mild pulmonary vascular congestion.  Left basilar opacity. Electronically Signed   By: Ree Molt M.D.   On: 01/28/2023 15:11   CUP PACEART INCLINIC DEVICE CHECK Result Date: 01/24/2023 Pacemaker check in clinic. Normal device function. Thresholds, sensing, impedance's consistent with previous measurements. LRL changed to 70 bpm with rate response turned on. Permanent AF. Device programmed at appropriate safety margins. Histogram distribution appropriate for patient activity level. Estimated longevity 4.4 years after changes. Patient enrolled in remote follow-up. Patient education completed.  CT Angio Chest PE W and/or Wo Contrast Result Date: 01/13/2023 CLINICAL DATA:  Abdominal pain, hypotension, PE suspected EXAM: CT ANGIOGRAPHY CHEST CT ABDOMEN AND PELVIS WITH CONTRAST TECHNIQUE: Multidetector CT imaging of the chest was performed using the standard protocol during bolus administration of intravenous contrast. Multiplanar CT image reconstructions and MIPs were obtained to evaluate the vascular anatomy. Multidetector CT imaging of the abdomen and pelvis was performed using the standard protocol during bolus administration of intravenous contrast. RADIATION DOSE REDUCTION: This exam was performed according to the departmental dose-optimization program which includes automated exposure control, adjustment of the mA and/or kV according to patient size and/or use of iterative reconstruction technique. CONTRAST:  75mL OMNIPAQUE  IOHEXOL  350 MG/ML SOLN COMPARISON:  Same day chest radiograph; CT chest 12/17/2022 and CT abdomen and pelvis 12/01/2022 FINDINGS: CTA CHEST FINDINGS  Cardiovascular: Negative for acute pulmonary embolism. No pericardial effusion. Coronary artery and aortic atherosclerotic calcification. The ascending aorta is dilated measuring up to 44 mm. Left chest wall pacemaker. Mediastinum/Nodes: Trachea and esophagus are unremarkable. Shotty mediastinal and hilar lymph nodes are likely reactive. Lungs/Pleura: Emphysema. Diffuse bronchial wall thickening. No focal consolidation, pleural effusion, or pneumothorax. Interlobular septal thickening in the lower lobes. Musculoskeletal: No acute fracture. Review of the MIP images confirms the above findings. CT ABDOMEN and PELVIS FINDINGS Hepatobiliary: Cholecystectomy.  No acute abnormality. Pancreas: Marked atrophy with punctate calcifications in the residual pancreatic parenchyma of the head and uncinate process. There is adjacent peripancreatic stranding and trace fluid. No ductal dilation. Spleen: Splenectomy. Adrenals/Urinary Tract: Stable adrenal glands and kidneys. No urinary calculi or hydronephrosis. Bladder is unremarkable where not obscured by streak artifact. Stomach/Bowel: Postoperative change of partial colectomy. No bowel obstruction or bowel wall thickening. Stomach and appendix  are within normal limits. Vascular/Lymphatic: Aortic atherosclerosis. No enlarged abdominal or pelvic lymph nodes. Reproductive: Unremarkable. Other: No free intraperitoneal air. No abscess or organized fluid collection. Musculoskeletal: Right THA. No acute fracture. Remote fractures of the bilateral pubic rami. Posterior fusion L5-S1 Review of the MIP images confirms the above findings. IMPRESSION: 1. Negative for acute pulmonary embolism. 2. Emphysema and chronic bronchitis. 3. Acute on chronic pancreatitis. Aortic Atherosclerosis (ICD10-I70.0) and Emphysema (ICD10-J43.9). Electronically Signed   By: Norman Gatlin M.D.   On: 01/13/2023 23:28   CT ABDOMEN PELVIS W CONTRAST Result Date: 01/13/2023 CLINICAL DATA:  Abdominal pain,  hypotension, PE suspected EXAM: CT ANGIOGRAPHY CHEST CT ABDOMEN AND PELVIS WITH CONTRAST TECHNIQUE: Multidetector CT imaging of the chest was performed using the standard protocol during bolus administration of intravenous contrast. Multiplanar CT image reconstructions and MIPs were obtained to evaluate the vascular anatomy. Multidetector CT imaging of the abdomen and pelvis was performed using the standard protocol during bolus administration of intravenous contrast. RADIATION DOSE REDUCTION: This exam was performed according to the departmental dose-optimization program which includes automated exposure control, adjustment of the mA and/or kV according to patient size and/or use of iterative reconstruction technique. CONTRAST:  75mL OMNIPAQUE  IOHEXOL  350 MG/ML SOLN COMPARISON:  Same day chest radiograph; CT chest 12/17/2022 and CT abdomen and pelvis 12/01/2022 FINDINGS: CTA CHEST FINDINGS Cardiovascular: Negative for acute pulmonary embolism. No pericardial effusion. Coronary artery and aortic atherosclerotic calcification. The ascending aorta is dilated measuring up to 44 mm. Left chest wall pacemaker. Mediastinum/Nodes: Trachea and esophagus are unremarkable. Shotty mediastinal and hilar lymph nodes are likely reactive. Lungs/Pleura: Emphysema. Diffuse bronchial wall thickening. No focal consolidation, pleural effusion, or pneumothorax. Interlobular septal thickening in the lower lobes. Musculoskeletal: No acute fracture. Review of the MIP images confirms the above findings. CT ABDOMEN and PELVIS FINDINGS Hepatobiliary: Cholecystectomy.  No acute abnormality. Pancreas: Marked atrophy with punctate calcifications in the residual pancreatic parenchyma of the head and uncinate process. There is adjacent peripancreatic stranding and trace fluid. No ductal dilation. Spleen: Splenectomy. Adrenals/Urinary Tract: Stable adrenal glands and kidneys. No urinary calculi or hydronephrosis. Bladder is unremarkable where not  obscured by streak artifact. Stomach/Bowel: Postoperative change of partial colectomy. No bowel obstruction or bowel wall thickening. Stomach and appendix are within normal limits. Vascular/Lymphatic: Aortic atherosclerosis. No enlarged abdominal or pelvic lymph nodes. Reproductive: Unremarkable. Other: No free intraperitoneal air. No abscess or organized fluid collection. Musculoskeletal: Right THA. No acute fracture. Remote fractures of the bilateral pubic rami. Posterior fusion L5-S1 Review of the MIP images confirms the above findings. IMPRESSION: 1. Negative for acute pulmonary embolism. 2. Emphysema and chronic bronchitis. 3. Acute on chronic pancreatitis. Aortic Atherosclerosis (ICD10-I70.0) and Emphysema (ICD10-J43.9). Electronically Signed   By: Norman Gatlin M.D.   On: 01/13/2023 23:28   DG Chest 2 View Result Date: 01/13/2023 CLINICAL DATA:  SOB chronic cough EXAM: CHEST - 2 VIEW COMPARISON:  12/28/2022. FINDINGS: Cardiac silhouette is enlarged. Lungs are hyperinflated suggesting COPD. There is no focal consolidation. No pneumothorax or pleural effusion. Aorta is calcified. There are thoracic degenerative changes. There is a left-sided pacer. IMPRESSION: Findings suggest COPD.  Otherwise no acute cardiopulmonary disease. Electronically Signed   By: Fonda Field M.D.   On: 01/13/2023 20:20   CUP PACEART INCLINIC DEVICE CHECK Result Date: 01/13/2023 Wound check appointment. Steri-strips removed. Wound CDI. well healed. Normal device function. Device programmed at 3.5V programmed on for extra safety margin until 3 month visit. No mode switches or high ventricular rates noted.  Patient educated about wound care, arm mobility, lifting restrictions. ROV in 3 months with implanting physician. Decreased rate from 90 to 80 post AVN ablation. Pt c/o hypoTN, SOB, fatigue, BLE edema. BP checked twice in each arm and consistently low 70's/50's. [employer] [facility] assessed pt and med list. D/c losartan  and metop  tartrate. Started metop succinate. Appointment on 12/27 w/ [facility]. 12/30  w/ [facility] in [place]. Notified WC as FYI. Update: after reviewing chart, called pt and advised he go to [facility] ED. Notifed WC. Pt agreeable.Augustin Quivers, RN   Microbiology Recent Results (from the past 240 hours)  Blood Culture (routine x 2)     Status: Abnormal   Collection Time: 01/28/23  1:02 PM   Specimen: BLOOD  Result Value Ref Range Status   Specimen Description BLOOD SITE NOT SPECIFIED  Final   Special Requests   Final    BOTTLES DRAWN AEROBIC AND ANAEROBIC Blood Culture results may not be optimal due to an inadequate volume of blood received in culture bottles   Culture  Setup Time   Final    GRAM NEGATIVE RODS IN BOTH AEROBIC AND ANAEROBIC BOTTLES CRITICAL RESULT CALLED TO, READ BACK BY AND VERIFIED WITH: V BRYK,PHARMD@[id number]  01/29/23 MK Performed at [facility], [address] [place], [place] [address]    Culture (A)  Final    KLEBSIELLA PNEUMONIAE Confirmed Extended Spectrum Beta-Lactamase Producer (ESBL).  In bloodstream infections from ESBL organisms, carbapenems are preferred over piperacillin /tazobactam. They are shown to have a lower risk of mortality.    Report Status 01/31/2023 FINAL  Final   Organism ID, Bacteria KLEBSIELLA PNEUMONIAE  Final      Susceptibility   Klebsiella pneumoniae - MIC*    AMPICILLIN >=32 RESISTANT Resistant     CEFEPIME  >=32 RESISTANT Resistant     CEFTAZIDIME RESISTANT Resistant     CEFTRIAXONE >=64 RESISTANT Resistant     CIPROFLOXACIN  1 RESISTANT Resistant     GENTAMICIN  >=16 RESISTANT Resistant     IMIPENEM <=0.25 SENSITIVE Sensitive     TRIMETH/SULFA >=320 RESISTANT Resistant     AMPICILLIN/SULBACTAM >=32 RESISTANT Resistant     PIP/TAZO 16 SENSITIVE Sensitive ug/mL    * KLEBSIELLA PNEUMONIAE  Blood Culture ID Panel (Reflexed)     Status: Abnormal   Collection Time: 01/28/23  1:02 PM  Result Value Ref Range Status   Enterococcus  faecalis NOT DETECTED NOT DETECTED Final   Enterococcus Faecium NOT DETECTED NOT DETECTED Final   Listeria monocytogenes NOT DETECTED NOT DETECTED Final   Staphylococcus species NOT DETECTED NOT DETECTED Final   Staphylococcus aureus (BCID) NOT DETECTED NOT DETECTED Final   Staphylococcus epidermidis NOT DETECTED NOT DETECTED Final   Staphylococcus lugdunensis NOT DETECTED NOT DETECTED Final   Streptococcus species NOT DETECTED NOT DETECTED Final   Streptococcus agalactiae NOT DETECTED NOT DETECTED Final   Streptococcus pneumoniae NOT DETECTED NOT DETECTED Final   Streptococcus pyogenes NOT DETECTED NOT DETECTED Final   A.calcoaceticus-baumannii NOT DETECTED NOT DETECTED Final   Bacteroides fragilis NOT DETECTED NOT DETECTED Final   Enterobacterales DETECTED (A) NOT DETECTED Final    Comment: Enterobacterales represent a large order of gram negative bacteria, not a single organism. CRITICAL RESULT CALLED TO, READ BACK BY AND VERIFIED WITH: V BRYK,PHARMD@0524  01/29/23 MK    Enterobacter cloacae complex NOT DETECTED NOT DETECTED Final   Escherichia coli NOT DETECTED NOT DETECTED Final   Klebsiella aerogenes NOT DETECTED NOT DETECTED Final   Klebsiella oxytoca NOT DETECTED NOT DETECTED  Final   Klebsiella pneumoniae DETECTED (A) NOT DETECTED Final    Comment: CRITICAL RESULT CALLED TO, READ BACK BY AND VERIFIED WITH: V BRYK,PHARMD@0524  01/29/23 MK    Proteus species NOT DETECTED NOT DETECTED Final   Salmonella species NOT DETECTED NOT DETECTED Final   Serratia marcescens NOT DETECTED NOT DETECTED Final   Haemophilus influenzae NOT DETECTED NOT DETECTED Final   Neisseria meningitidis NOT DETECTED NOT DETECTED Final   Pseudomonas aeruginosa NOT DETECTED NOT DETECTED Final   Stenotrophomonas maltophilia NOT DETECTED NOT DETECTED Final   Candida albicans NOT DETECTED NOT DETECTED Final   Candida auris NOT DETECTED NOT DETECTED Final   Candida glabrata NOT DETECTED NOT DETECTED Final    Candida krusei NOT DETECTED NOT DETECTED Final   Candida parapsilosis NOT DETECTED NOT DETECTED Final   Candida tropicalis NOT DETECTED NOT DETECTED Final   Cryptococcus neoformans/gattii NOT DETECTED NOT DETECTED Final   CTX-M ESBL DETECTED (A) NOT DETECTED Final    Comment: CRITICAL RESULT CALLED TO, READ BACK BY AND VERIFIED WITH: V BRYK,PHARMD@0524  01/29/23 MK (NOTE) Extended spectrum beta-lactamase detected. Recommend a carbapenem as initial therapy.      Carbapenem resistance IMP NOT DETECTED NOT DETECTED Final   Carbapenem resistance KPC NOT DETECTED NOT DETECTED Final   Carbapenem resistance NDM NOT DETECTED NOT DETECTED Final   Carbapenem resist OXA 48 LIKE NOT DETECTED NOT DETECTED Final   Carbapenem resistance VIM NOT DETECTED NOT DETECTED Final    Comment: Performed at [facility], [address], [place], [place] [address]  Resp panel by RT-PCR (RSV, Flu A&B, Covid) Peripheral     Status: None   Collection Time: 01/28/23  1:03 PM   Specimen: Peripheral; Nasal Swab  Result Value Ref Range Status   SARS Coronavirus 2 by RT PCR NEGATIVE NEGATIVE Final   Influenza A by PCR NEGATIVE NEGATIVE Final   Influenza B by PCR NEGATIVE NEGATIVE Final    Comment: (NOTE) The Xpert Xpress SARS-CoV-2/FLU/RSV plus assay is intended as an aid in the diagnosis of influenza from Nasopharyngeal swab specimens and should not be used as a sole basis for treatment. Nasal washings and aspirates are unacceptable for Xpert Xpress SARS-CoV-2/FLU/RSV testing.  Fact Sheet for Patients: bloggercourse.com  Fact Sheet for Healthcare Providers: seriousbroker.it  This test is not yet approved or cleared by the [place]  FDA and has been authorized for detection and/or diagnosis of SARS-CoV-2 by FDA under an Emergency Use Authorization (EUA). This EUA will remain in effect (meaning this test can be used) for the duration of the COVID-19  declaration under Section 564(b)(1) of the Act, 21 U.S.C. section 360bbb-3(b)(1), unless the authorization is terminated or revoked.     Resp Syncytial Virus by PCR NEGATIVE NEGATIVE Final    Comment: (NOTE) Fact Sheet for Patients: bloggercourse.com  Fact Sheet for Healthcare Providers: seriousbroker.it  This test is not yet approved or cleared by the [place]  FDA and has been authorized for detection and/or diagnosis of SARS-CoV-2 by FDA under an Emergency Use Authorization (EUA). This EUA will remain in effect (meaning this test can be used) for the duration of the COVID-19 declaration under Section 564(b)(1) of the Act, 21 U.S.C. section 360bbb-3(b)(1), unless the authorization is terminated or revoked.  Performed at [facility], [address] [place], [place] [address]   Blood Culture (routine x 2)     Status: Abnormal   Collection Time: 01/28/23  2:47 PM   Specimen: BLOOD RIGHT HAND  Result  Value Ref Range Status   Specimen Description BLOOD RIGHT HAND  Final   Special Requests   Final    BOTTLES DRAWN AEROBIC ONLY Blood Culture results may not be optimal due to an inadequate volume of blood received in culture bottles   Culture  Setup Time   Final    GRAM NEGATIVE RODS AEROBIC BOTTLE ONLY CRITICAL VALUE NOTED.  VALUE IS CONSISTENT WITH PREVIOUSLY REPORTED AND CALLED VALUE.    Culture (A)  Final    KLEBSIELLA PNEUMONIAE SUSCEPTIBILITIES PERFORMED ON PREVIOUS CULTURE WITHIN THE LAST 5 DAYS. Performed at [facility], [address], [place], [place] [address]    Report Status 01/31/2023 FINAL  Final  MRSA Next Gen by PCR, Nasal     Status: None   Collection Time: 01/28/23  5:00 PM   Specimen: Nasal Mucosa; Nasal Swab  Result Value Ref Range Status   MRSA by PCR Next Gen NOT DETECTED NOT DETECTED Final    Comment: (NOTE) The GeneXpert MRSA Assay (FDA approved for NASAL specimens only), is one  component of a comprehensive MRSA colonization surveillance program. It is not intended to diagnose MRSA infection nor to guide or monitor treatment for MRSA infections. Test performance is not FDA approved in patients less than 68 years old. Performed at [facility], [address] [place], [place] [address]   Urine Culture     Status: Abnormal   Collection Time: 01/28/23  7:07 PM   Specimen: Urine, Random  Result Value Ref Range Status   Specimen Description URINE, RANDOM  Final   Special Requests   Final    NONE Reflexed from [id number] Performed at [facility], [address], [place], [place] [address]    Culture MULTIPLE SPECIES PRESENT, SUGGEST RECOLLECTION (A)  Final   Report Status 01/30/2023 FINAL  Final  Respiratory (~20 pathogens) panel by PCR     Status: Abnormal   Collection Time: 01/28/23 11:02 PM   Specimen: Nasopharyngeal Swab; Respiratory  Result Value Ref Range Status   Adenovirus NOT DETECTED NOT DETECTED Final   Coronavirus 229E NOT DETECTED NOT DETECTED Final    Comment: (NOTE) The Coronavirus on the Respiratory Panel, DOES NOT test for the novel  Coronavirus (2019 nCoV)    Coronavirus HKU1 NOT DETECTED NOT DETECTED Final   Coronavirus NL63 NOT DETECTED NOT DETECTED Final   Coronavirus OC43 NOT DETECTED NOT DETECTED Final   Metapneumovirus NOT DETECTED NOT DETECTED Final   Rhinovirus / Enterovirus DETECTED (A) NOT DETECTED Final   Influenza A NOT DETECTED NOT DETECTED Final   Influenza B NOT DETECTED NOT DETECTED Final   Parainfluenza Virus 1 NOT DETECTED NOT DETECTED Final   Parainfluenza Virus 2 NOT DETECTED NOT DETECTED Final   Parainfluenza Virus 3 NOT DETECTED NOT DETECTED Final   Parainfluenza Virus 4 NOT DETECTED NOT DETECTED Final   Respiratory Syncytial Virus NOT DETECTED NOT DETECTED Final   Bordetella pertussis NOT DETECTED NOT DETECTED Final   Bordetella Parapertussis NOT DETECTED NOT DETECTED Final   Chlamydophila pneumoniae NOT  DETECTED NOT DETECTED Final   Mycoplasma pneumoniae NOT DETECTED NOT DETECTED Final    Comment: Performed at [facility], [address], [place], [place] [address]  Culture, blood (Routine X 2) w Reflex to ID Panel     Status: None (Preliminary result)   Collection Time: 01/29/23  9:54 AM   Specimen: BLOOD RIGHT ARM  Result Value Ref Range Status   Specimen Description  BLOOD RIGHT ARM  Final   Special Requests   Final    BOTTLES DRAWN AEROBIC AND ANAEROBIC Blood Culture results may not be optimal due to an inadequate volume of blood received in culture bottles   Culture   Final    NO GROWTH 4 DAYS Performed at [facility], [address], [place], [place] [address]    Report Status PENDING  Incomplete  Culture, blood (Routine X 2) w Reflex to ID Panel     Status: None (Preliminary result)   Collection Time: 01/29/23  9:55 AM   Specimen: BLOOD LEFT ARM  Result Value Ref Range Status   Specimen Description BLOOD LEFT ARM  Final   Special Requests   Final    BOTTLES DRAWN AEROBIC AND ANAEROBIC Blood Culture results may not be optimal due to an inadequate volume of blood received in culture bottles   Culture   Final    NO GROWTH 4 DAYS Performed at [facility], [address] [place], [place] [address]    Report Status PENDING  Incomplete  Expectorated Sputum Assessment w Gram Stain, Rflx to Resp Cult     Status: None   Collection Time: 01/29/23  7:23 PM   Specimen: Expectorated Sputum  Result Value Ref Range Status   Specimen Description EXPECTORATED SPUTUM  Final   Special Requests NONE  Final   Sputum evaluation   Final    THIS SPECIMEN IS ACCEPTABLE FOR SPUTUM CULTURE Performed at [facility], [address] [place], [place] [address]    Report Status 01/29/2023 FINAL  Final  Culture, Respiratory w Gram Stain     Status: None   Collection Time: 01/29/23  7:23 PM  Result Value Ref Range Status   Specimen Description EXPECTORATED SPUTUM  Final    Special Requests NONE Reflexed from [id number]  Final   Gram Stain   Final    RARE WBC PRESENT, PREDOMINANTLY PMN NO ORGANISMS SEEN    Culture   Final    RARE Normal respiratory flora-no Staph aureus or Pseudomonas seen Performed at [facility], [address] [place], [place] [address]    Report Status 01/31/2023 FINAL  Final    Lab Basic Metabolic Panel: Recent Labs  Lab 01/28/23 1302 01/28/23 2000 01/28/23 2023 01/29/23 0456 01/30/23 0415 01/31/23 0433 01/31/23 1200 01/31/23 1724 02/01/23 0344 02/01/23 1355  NA 137  --    < > 135 137  --  134*  --  136 134*  K 4.2  --    < > 4.4 4.3  --  4.1  --  3.9 3.8  CL 96*  --   --  99 99  --  96*  --  95*  --   CO2 12*  --   --  13* 19*  --  20*  --  23  --   GLUCOSE 96  --   --  142* 134*  --  215*  --  306*  --   BUN 8  --   --  8 9  --  13  --  18  --   CREATININE 1.84*  --   --  1.52* 1.07  --  1.03  --  1.01  --   CALCIUM  7.6*  --   --  8.2* 8.1*  --  8.1*  --  8.4*  --   MG  --  1.1*  --  2.2  --  1.4*  --  1.9 1.8  --   PHOS  --   --   --  2.9  --   --   --  1.7* 2.2*  --    < > = values in this interval not displayed.   Liver Function Tests: Recent Labs  Lab 01/28/23 1302 01/29/23 0456 01/30/23 0415 01/31/23 1200  AST 33 35 25 21  ALT 15 16 16 16   ALKPHOS 74 82 69 86  BILITOT 1.7* 2.1* 1.8* 1.7*  PROT 3.9* 4.3* 3.9* 4.2*  ALBUMIN  1.7* 1.8* 1.7* 1.7*   Recent Labs  Lab 01/28/23 2000  LIPASE 17   No results for input(s): AMMONIA in the last 168 hours. CBC: Recent Labs  Lab 01/28/23 1302 01/28/23 2000 01/29/23 0456 01/29/23 1355 01/30/23 0415 01/31/23 0433 01/31/23 1200 02/01/23 0344 02/01/23 1355  WBC 23.2*  --  23.7*  --  17.5* 15.8* 15.4* 11.4*  --   NEUTROABS 21.5*  --   --   --   --   --   --   --   --   HGB 8.6*   < > 8.5*  --  8.1* 8.5* 9.2* 8.6* 10.2*  HCT 25.5*   < > 24.6*  --  22.8* 24.5* 26.6* 24.7* 30.0*  MCV 95.1  --  91.4  --  90.1 90.7 91.4 90.8  --   PLT 204  --  203 192  191 181 196 178  --    < > = values in this interval not displayed.   Cardiac Enzymes: No results for input(s): CKTOTAL, CKMB, CKMBINDEX, TROPONINI in the last 168 hours. Sepsis Labs: Recent Labs  Lab 01/28/23 2000 01/29/23 0456 01/29/23 1355 01/30/23 0415 01/31/23 0433 01/31/23 1200 02/01/23 0344  PROCALCITON 4.79 3.53  --   --   --   --   --   WBC  --  23.7*  --  17.5* 15.8* 15.4* 11.4*  LATICACIDVEN 8.2* 6.9* 5.2* 1.9  --   --   --     Procedures/Operations  Aline CVC    Leita SHAUNNA Gaskins 2023/02/18, 12:16 PM

## 2023-02-26 DEATH — deceased

## 2023-04-06 ENCOUNTER — Ambulatory Visit: Payer: Medicare HMO | Admitting: Cardiology
